# Patient Record
Sex: Male | Born: 1950
Health system: Southern US, Community
[De-identification: ages and names within clinical notes are randomized; demographics above are authoritative.]

## PROBLEM LIST (undated history)

## (undated) DIAGNOSIS — Z8719 Personal history of other diseases of the digestive system: Secondary | ICD-10-CM

## (undated) DIAGNOSIS — K219 Gastro-esophageal reflux disease without esophagitis: Secondary | ICD-10-CM

## (undated) DIAGNOSIS — I10 Essential (primary) hypertension: Secondary | ICD-10-CM

## (undated) DIAGNOSIS — C4372 Malignant melanoma of left lower limb, including hip: Secondary | ICD-10-CM

## (undated) DIAGNOSIS — C801 Malignant (primary) neoplasm, unspecified: Secondary | ICD-10-CM

## (undated) HISTORY — PX: CARDIAC CATHETERIZATION: SHX172

## (undated) HISTORY — PX: OTHER SURGICAL HISTORY: SHX169

## (undated) HISTORY — DX: Malignant melanoma of left lower limb, including hip: C43.72

---

## 1999-04-28 ENCOUNTER — Encounter: Payer: Self-pay | Admitting: Emergency Medicine

## 1999-04-29 ENCOUNTER — Observation Stay (HOSPITAL_COMMUNITY): Admission: EM | Admit: 1999-04-29 | Discharge: 1999-04-29 | Payer: Self-pay | Admitting: Emergency Medicine

## 1999-07-26 ENCOUNTER — Encounter: Payer: Self-pay | Admitting: *Deleted

## 1999-07-26 ENCOUNTER — Ambulatory Visit (HOSPITAL_COMMUNITY): Admission: RE | Admit: 1999-07-26 | Discharge: 1999-07-26 | Payer: Self-pay | Admitting: *Deleted

## 2014-08-11 DIAGNOSIS — I10 Essential (primary) hypertension: Secondary | ICD-10-CM | POA: Diagnosis not present

## 2014-08-11 DIAGNOSIS — D485 Neoplasm of uncertain behavior of skin: Secondary | ICD-10-CM | POA: Diagnosis not present

## 2014-08-11 DIAGNOSIS — C4372 Malignant melanoma of left lower limb, including hip: Secondary | ICD-10-CM | POA: Diagnosis not present

## 2014-09-10 ENCOUNTER — Ambulatory Visit: Payer: Self-pay | Admitting: General Surgery

## 2014-09-10 DIAGNOSIS — C4372 Malignant melanoma of left lower limb, including hip: Secondary | ICD-10-CM | POA: Diagnosis not present

## 2014-09-10 DIAGNOSIS — L989 Disorder of the skin and subcutaneous tissue, unspecified: Secondary | ICD-10-CM | POA: Diagnosis not present

## 2014-09-22 DIAGNOSIS — I10 Essential (primary) hypertension: Secondary | ICD-10-CM | POA: Diagnosis not present

## 2014-09-22 DIAGNOSIS — C4372 Malignant melanoma of left lower limb, including hip: Secondary | ICD-10-CM | POA: Diagnosis not present

## 2014-09-22 NOTE — Pre-Procedure Instructions (Signed)
Walter Hernandez  09/22/2014   Your procedure is scheduled on:  Mon, April 25 @ 9:30 AM  Report to [facility] and go to Admitting at 7:30 AM.  Call this number if you have problems the morning of surgery: [phone]   Remember:   Do not eat food or drink liquids after midnight.   Take these medicines the morning of surgery with A SIP OF WATER: Omeprazole(Prilosec)              No Goody's,BC's,Aleve,Aspirin,Ibuprofen,Fish Oil,or any Herbal Medications.    Do not wear jewelry.  Do not wear lotions, powders, or colognes. You may wear deodorant.  Men may shave face and neck.  Do not bring valuables to the hospital.  The [facility] is not responsible                  for any belongings or valuables.               Contacts, dentures or bridgework may not be worn into surgery.  Leave suitcase in the car. After surgery it may be brought to your room.  For patients admitted to the hospital, discharge time is determined by your                treatment team.               Patients discharged the day of surgery will not be allowed to drive  home.    Special Instructions:   - Preparing for Surgery  Before surgery, you can play an important role.  Because skin is not sterile, your skin needs to be as free of germs as possible.  You can reduce the number of germs on you skin by washing with CHG (chlorahexidine gluconate) soap before surgery.  CHG is an antiseptic cleaner which kills germs and bonds with the skin to continue killing germs even after washing.  Please DO NOT use if you have an allergy to CHG or antibacterial soaps.  If your skin becomes reddened/irritated stop using the CHG and inform your nurse when you arrive at Short Stay.  Do not shave (including legs and underarms) for at least 48 hours prior to the first CHG shower.  You may shave your face.  Please follow these instructions carefully:   1.  Shower with CHG Soap the night before surgery and the                                 morning of Surgery.  2.  If you choose to wash your hair, wash your hair first as usual with your       normal shampoo.  3.  After you shampoo, rinse your hair and body thoroughly to remove the                      Shampoo.  4.  Use CHG as you would any other liquid soap.  You can apply chg directly       to the skin and wash gently with scrungie or a clean washcloth.  5.  Apply the CHG Soap to your body ONLY FROM THE NECK DOWN.        Do not use on open wounds or open sores.  Avoid contact with your eyes,       ears, mouth and genitals (private parts).  Wash genitals (private parts)  with your normal soap.  6.  Wash thoroughly, paying special attention to the area where your surgery        will be performed.  7.  Thoroughly rinse your body with warm water from the neck down.  8.  DO NOT shower/wash with your normal soap after using and rinsing off       the CHG Soap.  9.  Pat yourself dry with a clean towel.            10.  Wear clean pajamas.            11.  Place clean sheets on your bed the night of your first shower and do not        sleep with pets.  Day of Surgery  Do not apply any lotions/deoderants the morning of surgery.  Please wear clean clothes to the hospital/surgery center.     Please read over the following fact sheets that you were given: Pain Booklet, Coughing and Deep Breathing and Surgical Site Infection Prevention

## 2014-09-23 ENCOUNTER — Encounter (HOSPITAL_COMMUNITY)
Admission: RE | Admit: 2014-09-23 | Discharge: 2014-09-23 | Disposition: A | Discharge status: Home / Self Care | Payer: Medicare Other | Source: Ambulatory Visit | Attending: General Surgery | Admitting: General Surgery

## 2014-09-23 ENCOUNTER — Encounter (HOSPITAL_COMMUNITY): Payer: Self-pay

## 2014-09-23 ENCOUNTER — Other Ambulatory Visit: Payer: Self-pay

## 2014-09-23 DIAGNOSIS — Z01818 Encounter for other preprocedural examination: Secondary | ICD-10-CM | POA: Diagnosis not present

## 2014-09-23 DIAGNOSIS — I452 Bifascicular block: Secondary | ICD-10-CM | POA: Insufficient documentation

## 2014-09-23 DIAGNOSIS — Z01812 Encounter for preprocedural laboratory examination: Secondary | ICD-10-CM | POA: Insufficient documentation

## 2014-09-23 DIAGNOSIS — C4372 Malignant melanoma of left lower limb, including hip: Secondary | ICD-10-CM | POA: Diagnosis not present

## 2014-09-23 DIAGNOSIS — K219 Gastro-esophageal reflux disease without esophagitis: Secondary | ICD-10-CM | POA: Insufficient documentation

## 2014-09-23 DIAGNOSIS — I10 Essential (primary) hypertension: Secondary | ICD-10-CM | POA: Diagnosis not present

## 2014-09-23 HISTORY — DX: Essential (primary) hypertension: I10

## 2014-09-23 HISTORY — DX: Personal history of other diseases of the digestive system: Z87.19

## 2014-09-23 HISTORY — DX: Malignant (primary) neoplasm, unspecified: C80.1

## 2014-09-23 HISTORY — DX: Gastro-esophageal reflux disease without esophagitis: K21.9

## 2014-09-23 LAB — CBC
HCT: 46.5 % (ref 39.0–52.0)
Hemoglobin: 16.4 g/dL (ref 13.0–17.0)
MCH: 35.5 pg — ABNORMAL HIGH (ref 26.0–34.0)
MCHC: 35.3 g/dL (ref 30.0–36.0)
MCV: 100.6 fL — AB (ref 78.0–100.0)
Platelets: 212 10*3/uL (ref 150–400)
RBC: 4.62 MIL/uL (ref 4.22–5.81)
RDW: 12 % (ref 11.5–15.5)
WBC: 7.1 10*3/uL (ref 4.0–10.5)

## 2014-09-23 LAB — BASIC METABOLIC PANEL
ANION GAP: 10 (ref 5–15)
BUN: 8 mg/dL (ref 6–23)
CHLORIDE: 105 mmol/L (ref 96–112)
CO2: 27 mmol/L (ref 19–32)
CREATININE: 0.83 mg/dL (ref 0.50–1.35)
Calcium: 9.5 mg/dL (ref 8.4–10.5)
GFR calc Af Amer: >90 mL/min (ref >90)
GFR calc non Af Amer: >90 mL/min (ref >90)
Glucose, Bld: 109 mg/dL — ABNORMAL HIGH (ref 70–99)
POTASSIUM: 4.3 mmol/L (ref 3.5–5.1)
Sodium: 142 mmol/L (ref 135–145)

## 2014-09-23 NOTE — Progress Notes (Addendum)
Had 'chest pains' 16 yrs ago and had cath.  Came out 'Normal', except he has hiatal hernia.  Has not been back since and isn't having any problems. Ekg came back with "bifasicular block"  A. Zelenak, PA called and she will speak with pt.

## 2014-09-23 NOTE — Progress Notes (Signed)
Anesthesia PAT Evaluation:  Patient is a 64 year old male scheduled for wide excision melanoma, left calf with left inguinal SN biopsy on 09/29/14 by Dr. Jacinto Reap. [place] Silos.  History includes left leg melanoma, HTN, smoking, GERD, hiatal hernia, reported "normal" cath in 04/1999 (Dr. Glade Lloyd for evaluation of chest pain with abnormal EKG; chest pain attributed to hiatal hernia and GERD found with GI work-up/barium esophagram 07/26/99). PCP is Dr. Lavone Orn.  09/23/14 EKG: NSR, right BBB, LAFB, bi-fascicular block, septal infarct (age undetermined), possible lateral infarct (age undetermined).  Negative T wave in III and aVF, otherwise I think the EKG is similar to 04/28/99 EKG ([facility]).   He denies chest pain, SOB, edema, palpitations, syncope/near-syncope.  He plays golf and works in [employer].  He has to do a lot of walking, climbing on equipment, walking up stairs for his work and denies any associated CV symptoms.  He thought that Dr. Glade Lloyd may have mentioned a "small" murmur, but does not recall if he ever had an echo done.  Reportedly, Dr. Glade Lloyd did not recommend on-going cardiology follow-up.  On exam, patient is a pleasant Caucasian male in NAD.  Heart RRR. I did not appreciate a murmur.  No carotid bruits noted. Lungs clear. No ankle edema.  Good mouth opening.  Preoperative labs noted.   Patient denied CV symptoms and reports good activity tolerance.  I reviewed 2000 and today's EKG as well with anesthesiologist Dr. Therisa Doyne who agrees that if no acute changes then would anticipate that patient could proceed as planned.  Walter Hernandez [phone] 09/23/2014 2:26 PM

## 2014-09-23 NOTE — Progress Notes (Signed)
   09/23/14 0919  OBSTRUCTIVE SLEEP APNEA  Have you ever been diagnosed with sleep apnea through a sleep study? No  Do you snore loudly (loud enough to be heard through closed doors)?  1  Do you often feel tired, fatigued, or sleepy during the daytime? 0  Has anyone observed you stop breathing during your sleep? 0  Do you have, or are you being treated for high blood pressure? 1 (just recently dx)  BMI more than 35 kg/m2? 0  Age over 66 years old? 1  Neck circumference greater than 40 cm/16 inches? 1  Gender: 1

## 2014-09-28 MED ORDER — CEFAZOLIN SODIUM-DEXTROSE 2-3 GM-% IV SOLR
2.0000 g | INTRAVENOUS | Status: AC
  Administered 2014-09-29: 2 g via INTRAVENOUS
  Filled 2014-09-28: qty 50

## 2014-09-28 MED ORDER — CHLORHEXIDINE GLUCONATE 4 % EX LIQD
1.0000 "application " | Freq: Once | CUTANEOUS | Status: DC
  Filled 2014-09-28: qty 15

## 2014-09-29 ENCOUNTER — Encounter (HOSPITAL_COMMUNITY)
Admission: RE | Disposition: A | Discharge status: Home / Self Care | Payer: Self-pay | Source: Ambulatory Visit | Attending: General Surgery

## 2014-09-29 ENCOUNTER — Encounter (HOSPITAL_COMMUNITY): Payer: Self-pay | Admitting: Surgery

## 2014-09-29 ENCOUNTER — Ambulatory Visit (HOSPITAL_COMMUNITY)
Admission: RE | Admit: 2014-09-29 | Discharge: 2014-09-29 | Disposition: A | Discharge status: Home / Self Care | Payer: Medicare Other | Source: Ambulatory Visit | Attending: General Surgery | Admitting: General Surgery

## 2014-09-29 ENCOUNTER — Ambulatory Visit (HOSPITAL_COMMUNITY): Payer: Medicare Other | Admitting: Anesthesiology

## 2014-09-29 ENCOUNTER — Ambulatory Visit (HOSPITAL_COMMUNITY): Payer: Medicare Other | Admitting: Vascular Surgery

## 2014-09-29 DIAGNOSIS — Z85820 Personal history of malignant melanoma of skin: Secondary | ICD-10-CM | POA: Diagnosis not present

## 2014-09-29 DIAGNOSIS — K219 Gastro-esophageal reflux disease without esophagitis: Secondary | ICD-10-CM | POA: Insufficient documentation

## 2014-09-29 DIAGNOSIS — F17210 Nicotine dependence, cigarettes, uncomplicated: Secondary | ICD-10-CM | POA: Diagnosis not present

## 2014-09-29 DIAGNOSIS — C4372 Malignant melanoma of left lower limb, including hip: Secondary | ICD-10-CM | POA: Diagnosis not present

## 2014-09-29 DIAGNOSIS — I10 Essential (primary) hypertension: Secondary | ICD-10-CM | POA: Insufficient documentation

## 2014-09-29 DIAGNOSIS — L905 Scar conditions and fibrosis of skin: Secondary | ICD-10-CM | POA: Insufficient documentation

## 2014-09-29 DIAGNOSIS — K449 Diaphragmatic hernia without obstruction or gangrene: Secondary | ICD-10-CM | POA: Diagnosis not present

## 2014-09-29 HISTORY — PX: MELANOMA EXCISION WITH SENTINEL LYMPH NODE BIOPSY: SHX5267

## 2014-09-29 SURGERY — MELANOMA EXCISION WITH SENTINEL LYMPH NODE BIOPSY
Anesthesia: General | Site: Leg Lower | Laterality: Left

## 2014-09-29 MED ORDER — TECHNETIUM TC 99M SULFUR COLLOID FILTERED: 0.5000 | Freq: Once | INTRAVENOUS | Status: AC | PRN

## 2014-09-29 MED ORDER — DEXAMETHASONE SODIUM PHOSPHATE 4 MG/ML IJ SOLN
INTRAMUSCULAR | Status: DC | PRN
  Administered 2014-09-29: 4 mg via INTRAVENOUS

## 2014-09-29 MED ORDER — FENTANYL CITRATE (PF) 100 MCG/2ML IJ SOLN
INTRAMUSCULAR | Status: AC
  Administered 2014-09-29: 50 ug via INTRAVENOUS
  Filled 2014-09-29: qty 2

## 2014-09-29 MED ORDER — ONDANSETRON HCL 4 MG/2ML IJ SOLN
INTRAMUSCULAR | Status: DC | PRN
  Administered 2014-09-29: 4 mg via INTRAVENOUS

## 2014-09-29 MED ORDER — FENTANYL CITRATE (PF) 100 MCG/2ML IJ SOLN
50.0000 ug | Freq: Once | INTRAMUSCULAR | Status: AC
  Administered 2014-09-29: 50 ug via INTRAVENOUS

## 2014-09-29 MED ORDER — FENTANYL CITRATE (PF) 100 MCG/2ML IJ SOLN
INTRAMUSCULAR | Status: DC | PRN
  Administered 2014-09-29 (×4): 25 ug via INTRAVENOUS
  Administered 2014-09-29: 50 ug via INTRAVENOUS

## 2014-09-29 MED ORDER — OXYCODONE HCL 5 MG PO TABS: 5.0000 mg | ORAL_TABLET | ORAL | Status: DC | PRN

## 2014-09-29 MED ORDER — ARTIFICIAL TEARS OP OINT
TOPICAL_OINTMENT | OPHTHALMIC | Status: AC
  Filled 2014-09-29: qty 3.5

## 2014-09-29 MED ORDER — ONDANSETRON HCL 4 MG/2ML IJ SOLN
INTRAMUSCULAR | Status: AC
  Filled 2014-09-29: qty 2

## 2014-09-29 MED ORDER — SODIUM CHLORIDE 0.9 % IJ SOLN
INTRAMUSCULAR | Status: DC | PRN
  Administered 2014-09-29: 4 mL via INTRAMUSCULAR

## 2014-09-29 MED ORDER — STERILE WATER FOR INJECTION IJ SOLN
INTRAMUSCULAR | Status: AC
  Filled 2014-09-29: qty 10

## 2014-09-29 MED ORDER — PHENYLEPHRINE 40 MCG/ML (10ML) SYRINGE FOR IV PUSH (FOR BLOOD PRESSURE SUPPORT)
PREFILLED_SYRINGE | INTRAVENOUS | Status: AC
  Filled 2014-09-29: qty 10

## 2014-09-29 MED ORDER — MIDAZOLAM HCL 5 MG/ML IJ SOLN: 1.0000 mg | Freq: Once | INTRAMUSCULAR | Status: DC

## 2014-09-29 MED ORDER — BUPIVACAINE-EPINEPHRINE 0.25% -1:200000 IJ SOLN
INTRAMUSCULAR | Status: DC | PRN
  Administered 2014-09-29: 10 mL

## 2014-09-29 MED ORDER — EPHEDRINE SULFATE 50 MG/ML IJ SOLN
INTRAMUSCULAR | Status: AC
  Filled 2014-09-29: qty 1

## 2014-09-29 MED ORDER — METHYLENE BLUE 1 % INJ SOLN
INTRAMUSCULAR | Status: AC
  Filled 2014-09-29: qty 10

## 2014-09-29 MED ORDER — SODIUM CHLORIDE 0.9 % IJ SOLN
INTRAMUSCULAR | Status: AC
  Filled 2014-09-29: qty 3

## 2014-09-29 MED ORDER — PROMETHAZINE HCL 25 MG/ML IJ SOLN: 6.2500 mg | INTRAMUSCULAR | Status: DC | PRN

## 2014-09-29 MED ORDER — FENTANYL CITRATE (PF) 250 MCG/5ML IJ SOLN
INTRAMUSCULAR | Status: AC
  Filled 2014-09-29: qty 5

## 2014-09-29 MED ORDER — MIDAZOLAM HCL 5 MG/5ML IJ SOLN
INTRAMUSCULAR | Status: DC | PRN
  Administered 2014-09-29: 2 mg via INTRAVENOUS

## 2014-09-29 MED ORDER — DEXAMETHASONE SODIUM PHOSPHATE 4 MG/ML IJ SOLN
INTRAMUSCULAR | Status: AC
  Filled 2014-09-29: qty 2

## 2014-09-29 MED ORDER — ROCURONIUM BROMIDE 50 MG/5ML IV SOLN
INTRAVENOUS | Status: AC
  Filled 2014-09-29: qty 1

## 2014-09-29 MED ORDER — EPHEDRINE SULFATE 50 MG/ML IJ SOLN
INTRAMUSCULAR | Status: DC | PRN
  Administered 2014-09-29 (×2): 5 mg via INTRAVENOUS

## 2014-09-29 MED ORDER — SODIUM CHLORIDE 0.9 % IJ SOLN
INTRAMUSCULAR | Status: AC
  Filled 2014-09-29: qty 10

## 2014-09-29 MED ORDER — PROPOFOL 10 MG/ML IV BOLUS
INTRAVENOUS | Status: AC
  Filled 2014-09-29: qty 20

## 2014-09-29 MED ORDER — PROPOFOL 10 MG/ML IV BOLUS
INTRAVENOUS | Status: DC | PRN
  Administered 2014-09-29: 180 mg via INTRAVENOUS

## 2014-09-29 MED ORDER — HYDROMORPHONE HCL 1 MG/ML IJ SOLN: 0.2500 mg | INTRAMUSCULAR | Status: DC | PRN

## 2014-09-29 MED ORDER — LACTATED RINGERS IV SOLN
INTRAVENOUS | Status: DC
  Administered 2014-09-29: 08:00:00 via INTRAVENOUS

## 2014-09-29 MED ORDER — LIDOCAINE HCL (CARDIAC) 20 MG/ML IV SOLN
INTRAVENOUS | Status: DC | PRN
  Administered 2014-09-29: 100 mg via INTRAVENOUS

## 2014-09-29 MED ORDER — BUPIVACAINE-EPINEPHRINE (PF) 0.25% -1:200000 IJ SOLN
INTRAMUSCULAR | Status: AC
  Filled 2014-09-29: qty 30

## 2014-09-29 MED ORDER — MIDAZOLAM HCL 2 MG/2ML IJ SOLN
INTRAMUSCULAR | Status: AC
  Filled 2014-09-29: qty 2

## 2014-09-29 MED ORDER — MIDAZOLAM HCL 2 MG/2ML IJ SOLN
INTRAMUSCULAR | Status: AC
  Administered 2014-09-29: 1 mg
  Filled 2014-09-29: qty 2

## 2014-09-29 SURGICAL SUPPLY — 55 items
APPLIER CLIP 11 MED OPEN (CLIP)
BANDAGE ELASTIC 4 VELCRO ST LF (GAUZE/BANDAGES/DRESSINGS) ×3 IMPLANT
BENZOIN TINCTURE PRP APPL 2/3 (GAUZE/BANDAGES/DRESSINGS) IMPLANT
CANISTER SUCTION 2500CC (MISCELLANEOUS) ×3 IMPLANT
CHLORAPREP W/TINT 26ML (MISCELLANEOUS) ×3 IMPLANT
CLIP APPLIE 11 MED OPEN (CLIP) IMPLANT
CLIP TI WIDE RED SMALL 6 (CLIP) IMPLANT
CLOSURE WOUND 1/2 X4 (GAUZE/BANDAGES/DRESSINGS)
CONT SPEC 4OZ CLIKSEAL STRL BL (MISCELLANEOUS) ×3 IMPLANT
COVER PROBE W GEL 5X96 (DRAPES) ×3 IMPLANT
COVER SURGICAL LIGHT HANDLE (MISCELLANEOUS) ×3 IMPLANT
DRAPE LAPAROSCOPIC ABDOMINAL (DRAPES) IMPLANT
DRAPE UTILITY XL STRL (DRAPES) ×3 IMPLANT
ELECT CAUTERY BLADE 6.4 (BLADE) ×3 IMPLANT
ELECT REM PT RETURN 9FT ADLT (ELECTROSURGICAL) ×3
ELECTRODE REM PT RTRN 9FT ADLT (ELECTROSURGICAL) ×1 IMPLANT
GAUZE SPONGE 4X4 12PLY STRL (GAUZE/BANDAGES/DRESSINGS) IMPLANT
GEL ULTRASOUND 20GR AQUASONIC (MISCELLANEOUS) IMPLANT
GLOVE BIO SURGEON STRL SZ8 (GLOVE) ×6 IMPLANT
GLOVE BIOGEL PI IND STRL 7.0 (GLOVE) ×1 IMPLANT
GLOVE BIOGEL PI IND STRL 7.5 (GLOVE) ×1 IMPLANT
GLOVE BIOGEL PI IND STRL 8 (GLOVE) ×1 IMPLANT
GLOVE BIOGEL PI INDICATOR 7.0 (GLOVE) ×2
GLOVE BIOGEL PI INDICATOR 7.5 (GLOVE) ×2
GLOVE BIOGEL PI INDICATOR 8 (GLOVE) ×2
GLOVE SURG SS PI 7.0 STRL IVOR (GLOVE) ×3 IMPLANT
GOWN STRL REUS W/ TWL LRG LVL3 (GOWN DISPOSABLE) ×1 IMPLANT
GOWN STRL REUS W/ TWL XL LVL3 (GOWN DISPOSABLE) ×1 IMPLANT
GOWN STRL REUS W/TWL LRG LVL3 (GOWN DISPOSABLE) ×2
GOWN STRL REUS W/TWL XL LVL3 (GOWN DISPOSABLE) ×2
KIT BASIN OR (CUSTOM PROCEDURE TRAY) ×3 IMPLANT
KIT ROOM TURNOVER OR (KITS) ×3 IMPLANT
LIQUID BAND (GAUZE/BANDAGES/DRESSINGS) ×3 IMPLANT
NEEDLE 18GX1X1/2 (RX/OR ONLY) (NEEDLE) ×3 IMPLANT
NEEDLE 22X1 1/2 (OR ONLY) (NEEDLE) ×6 IMPLANT
NS IRRIG 1000ML POUR BTL (IV SOLUTION) ×3 IMPLANT
PACK GENERAL/GYN (CUSTOM PROCEDURE TRAY) ×3 IMPLANT
PAD ARMBOARD 7.5X6 YLW CONV (MISCELLANEOUS) ×3 IMPLANT
PEN SKIN MARKING BROAD (MISCELLANEOUS) ×3 IMPLANT
SPECIMEN JAR MEDIUM (MISCELLANEOUS) ×3 IMPLANT
SPONGE GAUZE 4X4 12PLY STER LF (GAUZE/BANDAGES/DRESSINGS) ×3 IMPLANT
STAPLER VISISTAT 35W (STAPLE) IMPLANT
STRIP CLOSURE SKIN 1/2X4 (GAUZE/BANDAGES/DRESSINGS) IMPLANT
SUT ETHILON 2 0 FS 18 (SUTURE) ×6 IMPLANT
SUT MON AB 4-0 PC3 18 (SUTURE) ×3 IMPLANT
SUT SILK 2 0 FS (SUTURE) ×3 IMPLANT
SUT SILK 2 0 SH (SUTURE) IMPLANT
SUT VIC AB 2-0 SH 27 (SUTURE) ×2
SUT VIC AB 2-0 SH 27XBRD (SUTURE) ×1 IMPLANT
SUT VIC AB 3-0 SH 27 (SUTURE) ×2
SUT VIC AB 3-0 SH 27XBRD (SUTURE) ×1 IMPLANT
SYR CONTROL 10ML LL (SYRINGE) ×6 IMPLANT
TOWEL OR 17X24 6PK STRL BLUE (TOWEL DISPOSABLE) ×3 IMPLANT
TOWEL OR 17X26 10 PK STRL BLUE (TOWEL DISPOSABLE) IMPLANT
WATER STERILE IRR 1000ML POUR (IV SOLUTION) IMPLANT

## 2014-09-29 NOTE — Anesthesia Procedure Notes (Signed)
Procedure Name: LMA Insertion Date/Time: 09/29/2014 9:13 AM Performed by: Maryland Pink Pre-anesthesia Checklist: Patient identified, Emergency Drugs available, Suction available, Patient being monitored and Timeout performed Patient Re-evaluated:Patient Re-evaluated prior to inductionOxygen Delivery Method: Circle system utilized Preoxygenation: Pre-oxygenation with 100% oxygen Intubation Type: IV induction Ventilation: Mask ventilation without difficulty LMA: LMA inserted LMA Size: 5.0 Number of attempts: 1 Placement Confirmation: positive ETCO2 and breath sounds checked- equal and bilateral Tube secured with: Tape Dental Injury: Teeth and Oropharynx as per pre-operative assessment

## 2014-09-29 NOTE — Anesthesia Preprocedure Evaluation (Addendum)
Anesthesia Evaluation  Patient identified by MRN, date of birth, ID band Patient awake    Reviewed: Allergy & Precautions, NPO status , Patient's Chart, lab work & pertinent test results  Airway Mallampati: II  TM Distance: >3 FB Neck ROM: Full    Dental  (+) Upper Dentures, Lower Dentures   Pulmonary Current Smoker,  breath sounds clear to auscultation  Pulmonary exam normal       Cardiovascular hypertension, Pt. on medications Rhythm:Regular Rate:Normal     Neuro/Psych negative neurological ROS  negative psych ROS   GI/Hepatic Neg liver ROS, GERD-  Medicated,  Endo/Other  negative endocrine ROS  Renal/GU negative Renal ROS  negative genitourinary   Musculoskeletal negative musculoskeletal ROS (+)   Abdominal   Peds negative pediatric ROS (+)  Hematology negative hematology ROS (+)   Anesthesia Other Findings Patient claims dentures are extremely hard to remove. Given dental advisory for possible denture damage, voices understanding. Will proceed  Reproductive/Obstetrics negative OB ROS                            Anesthesia Physical Anesthesia Plan  ASA: III  Anesthesia Plan: General   Post-op Pain Management:    Induction: Intravenous  Airway Management Planned: LMA and Oral ETT  Additional Equipment:   Intra-op Plan:   Post-operative Plan: Extubation in OR  Informed Consent: I have reviewed the patients History and Physical, chart, labs and discussed the procedure including the risks, benefits and alternatives for the proposed anesthesia with the patient or authorized representative who has indicated his/her understanding and acceptance.   Dental advisory given  Plan Discussed with: CRNA and Surgeon  Anesthesia Plan Comments:         Anesthesia Quick Evaluation

## 2014-09-29 NOTE — Transfer of Care (Signed)
Immediate Anesthesia Transfer of Care Note  Patient: Walter Hernandez  Procedure(s) Performed: Procedure(s): WIDE EXCISION MELANOMA LEFT CALF WITH LEFT INGUINAL  SENTINEL LYMPH NODE BIOPSY (Left)  Patient Location: PACU  Anesthesia Type:General  Level of Consciousness: awake, alert  and oriented  Airway & Oxygen Therapy: Patient Spontanous Breathing and Patient connected to nasal cannula oxygen  Post-op Assessment: Report given to RN and Post -op Vital signs reviewed and stable  Post vital signs: Reviewed and stable  Last Vitals:  Filed Vitals:   09/29/14 1009  BP:   Pulse: 80  Temp:   Resp: 17    Complications: No apparent anesthesia complications

## 2014-09-29 NOTE — Anesthesia Postprocedure Evaluation (Signed)
  Anesthesia Post-op Note  Patient: Walter Hernandez  Procedure(s) Performed: Procedure(s) (LRB): WIDE EXCISION MELANOMA LEFT CALF WITH LEFT INGUINAL  SENTINEL LYMPH NODE BIOPSY (Left)  Patient Location: PACU  Anesthesia Type: General  Level of Consciousness: awake and alert   Airway and Oxygen Therapy: Patient Spontanous Breathing  Post-op Pain: mild  Post-op Assessment: Post-op Vital signs reviewed, Patient's Cardiovascular Status Stable, Respiratory Function Stable, Patent Airway and No signs of Nausea or vomiting  Last Vitals:  Filed Vitals:   09/29/14 1020  BP:   Pulse:   Temp: 36 C  Resp:     Post-op Vital Signs: stable   Complications: No apparent anesthesia complications

## 2014-09-29 NOTE — Op Note (Signed)
09/29/2014  9:53 AM  PATIENT:  Walter Hernandez  64 y.o. male  PRE-OPERATIVE DIAGNOSIS:  LEFT CALF MELANOMA  POST-OPERATIVE DIAGNOSIS:  LEFT CALF MELANOMA  PROCEDURE:  Procedure(s): LEFT INGUINAL  SENTINEL LYMPH NODE BIOPSY WITH BLUE DYE INJECTION WIDE EXCISION MELANOMA LEFT CALF 11X5CM WITH LAYERED PARTIAL CLOSURE  SURGEON:  Surgeon(s): Georganna Skeans, MD  ASSISTANTS: none   ANESTHESIA:   local and general  EBL:  Total I/O In: -  Out: 10 [Blood:10]  BLOOD ADMINISTERED:none  DRAINS: none   SPECIMEN:  Excision  DISPOSITION OF SPECIMEN:  PATHOLOGY  COUNTS:  YES  DICTATION: .Dragon Dictation  Walter Hernandez presents for left inguinal sentinel lymph node biopsy and wide excision melanoma left calf. He received preoperative nuclear medicine injection to the melanoma site. He was identified in the preop holding area. Informed consent was obtained. He received intravenous antibiotics. He was brought to the operating room. General anesthesia with laryngeal mask airway was administered by the anesthesia staff. We did a time out procedure. I cleaned his calf with chlorhexidine then injected methylene blue cutaneously in 4 regions surrounding the melanoma site on his calf. This area was massaged for a few minutes.His left groin and left distal calf were prepped and draped in sterile fashion. neoprobe was used to locate a spot in the left inguinal region. Incision was made and subcutaneous tissues were dissected down revealing a single lymph node which was hot on the neoprobe. It did not contain a lot of blue dye. The lymph node was circumferentially dissected, using cautery liberally on the small lymphatics. It was removed and sent as a hot node left inguinal. There were no other signals in the region. Wound was irrigated. Local was injected. Deep tissues were approximated with interrupted 3-0 Vicryl. Skin was closed with running 4-0 Monocryl subcuticular followed by Dermabond. Counts were correct there.  Attention was then directed to the melanoma. An elliptical incision was measured out to give greater than 2 cm of circumferential margin around the initial lesion. Local was injected an elliptical incision was made. Subcutaneous tissues were dissected down and the specimen was completely removed including skin and subcutaneous tissues. It was marked for pathology. I changed my gloves. Small flaps were raised medially and laterally. The superior and inferior portion of the wound were partially closed first by using interrupted 2-0 Vicryl's and then interrupted 3-0 nylons in the skin. As planned, the central portion of the wound was left open for wet-to-dry. Hemostasis was ensured. Wound was irrigated. A wet-to-dry sterile dressing was placed followed by Kerlix. All counts were correct. Patient tolerated the procedure well without apparent complication was taken recovery in stable condition.  PATIENT DISPOSITION:  PACU - hemodynamically stable.   Delay start of Pharmacological VTE agent (>24hrs) due to surgical blood loss or risk of bleeding:  no  Georganna Skeans, MD, MPH, FACS Pager: [phone]  4/25/20169:53 AM

## 2014-09-29 NOTE — Interval H&P Note (Signed)
History and Physical Interval Note:  09/29/2014 10:20 AM  Walter Hernandez  has presented today for surgery, with the diagnosis of LEFT CALF MELANOMA  The various methods of treatment have been discussed with the patient and family. After consideration of risks, benefits and other options for treatment, the patient has consented to  Procedure(s): WIDE EXCISION MELANOMA LEFT CALF WITH LEFT INGUINAL  SENTINEL LYMPH NODE BIOPSY (Left) as a surgical intervention .  The patient's history has been reviewed, patient examined, no change in status, stable for surgery.  I have reviewed the patient's chart and labs.  Questions were answered to the patient's satisfaction.     Keyani Rigdon E

## 2014-09-29 NOTE — Progress Notes (Signed)
Patient refused to take upper and lower dentures out. Patient stated "they are in there good...Marland KitchenMarland Kitchenthey won't come out. I didn't have to take them out when I had my colonoscopy." Nurse explained to patient the importance of removing his dentures and explained that this procedure would be different than having a colonoscopy.  Nurse called Dr. Kalman Shan (Anesthesia) and informed him of this. Dr. Kalman Shan in to see patient. Patient still refused to remove dentures after Dr. Kalman Shan spoke with patient. Will continue to monitor. Wife at stretcher side.

## 2014-09-29 NOTE — Addendum Note (Signed)
Addendum  created 09/29/14 1520 by Maryland Pink, CRNA   Modules edited: Anesthesia Events, Narrator   Narrator:  Narrator: Event Log Edited

## 2014-09-29 NOTE — H&P (Signed)
History of Present Illness Walter Neri E. Grandville Silos MD; 09/10/2014 9:47 AM) Patient words: New Patient.  The patient is a 64 year old male who presents with malignant melanoma. [name] underwent biopsy of a lesion on his left distal medial calf. This had developed over several months. His wife is concerned about it. He saw Dr. Ronnald Ramp and underwent a shave biopsy. This came back superficial spreading malignant melanoma 1.6 mm in thickness, deep margins negative, peripheral margins focally positive. I was asked to see him in consultation for further treatment. He has been feeling fine. He also did point out a rough patch of skin on his right calf for me to evaluate.   Allergies Sharman Crate, LPN; 06/08/2438 1:02 AM) No Known Drug Allergies04/11/2014  Medication History Sharman Crate, LPN; 12/05/5364 4:40 AM) Omeprazole ('40MG'$  Capsule DR, Oral daily) Active. Tylenol Extra Strength ('500MG'$  Tablet, Oral as needed) Active.  Review of Systems Clarise Cruz [facility] LPN; 08/08/7423 9:56 AM) General Not Present- Appetite Loss, Chills, Fatigue, Fever, Night Sweats, Weight Gain and Weight Loss. Skin Not Present- Change in Wart/Mole, Dryness, Hives, Jaundice, New Lesions, Non-Healing Wounds, Rash and Ulcer. HEENT Present- Seasonal Allergies and Wears glasses/contact lenses. Not Present- Earache, Hearing Loss, Hoarseness, Nose Bleed, Oral Ulcers, Ringing in the Ears, Sinus Pain, Sore Throat, Visual Disturbances and Yellow Eyes. Respiratory Not Present- Bloody sputum, Chronic Cough, Difficulty Breathing, Snoring and Wheezing. Breast Not Present- Breast Mass, Breast Pain, Nipple Discharge and Skin Changes. Cardiovascular Not Present- Chest Pain, Difficulty Breathing Lying Down, Leg Cramps, Palpitations, Rapid Heart Rate, Shortness of Breath and Swelling of Extremities. Gastrointestinal Not Present- Abdominal Pain, Bloating, Bloody Stool, Change in Bowel Habits, Chronic diarrhea, Constipation, Difficulty Swallowing,  Excessive gas, Gets full quickly at meals, Hemorrhoids, Indigestion, Nausea, Rectal Pain and Vomiting. Male Genitourinary Not Present- Blood in Urine, Change in Urinary Stream, Frequency, Impotence, Nocturia, Painful Urination, Urgency and Urine Leakage.   Vitals Clarise Cruz [facility] LPN; 08/12/7562 3:32 AM) 09/10/2014 9:25 AM Weight: 166 lb Height: 66in Body Surface Area: 1.87 m Body Mass Index: 26.79 kg/m Temp.: 98.23F(Oral)  Pulse: 78 (Regular)  BP: 150/90 (Sitting, Right Arm, Standard)    Physical Exam Walter Neri E. Grandville Silos MD; 09/10/2014 9:48 AM) General Mental Status-Alert. General Appearance-Consistent with stated age. Hydration-Well hydrated. Voice-Normal.  Head and Neck Head-normocephalic, atraumatic with no lesions or palpable masses. Trachea-midline. Thyroid Gland Characteristics - normal size and consistency.  Eye Eyeball - Bilateral-Extraocular movements intact. Sclera/Conjunctiva - Bilateral-No scleral icterus.  Chest and Lung Exam Chest and lung exam reveals -quiet, even and easy respiratory effort with no use of accessory muscles and on auscultation, normal breath sounds, no adventitious sounds and normal vocal resonance. Inspection Chest Wall - Normal. Back - normal.  Breast Breast - Left-Symmetric, Non Tender, No Biopsy scars, no Dimpling, No Inflammation, No Lumpectomy scars, No Mastectomy scars, No Peau d' Orange. Breast - Right-Symmetric, Non Tender, No Biopsy scars, no Dimpling, No Inflammation, No Lumpectomy scars, No Mastectomy scars, No Peau d' Orange. Breast Lump-No Palpable Breast Mass.  Cardiovascular Cardiovascular examination reveals -normal heart sounds, regular rate and rhythm with no murmurs and normal pedal pulses bilaterally.  Abdomen Inspection Inspection of the abdomen reveals - No Hernias. Skin - Scar - no surgical scars. Palpation/Percussion Palpation and Percussion of the abdomen reveal - Soft, Non  Tender, No Rebound tenderness, No Rigidity (guarding) and No hepatosplenomegaly. Auscultation Auscultation of the abdomen reveals - Bowel sounds normal.  Neurologic Neurologic evaluation reveals -alert and oriented x 3 with no impairment of recent or remote memory.  Mental Status-Normal.  Musculoskeletal Normal Exam - Left-Upper Extremity Strength Normal and Lower Extremity Strength Normal. Normal Exam - Right-Upper Extremity Strength Normal and Lower Extremity Strength Normal. Note: Left distal medial calf has a small scab at the biopsy site, no surrounding nodularity. Right mid calf anteriorly has a small rough patch of skin without significant abnormal pigmentation.   Lymphatic Head & Neck  General Head & Neck Lymphatics: Bilateral - Description - Normal. Axillary  General Axillary Region: Bilateral - Description - Normal. Tenderness - Non Tender. Femoral & Inguinal  Generalized Femoral & Inguinal Lymphatics: Bilateral - Description - Normal. Tenderness - Non Tender.    Assessment & Plan Walter Neri E. Grandville Silos MD; 09/10/2014 9:50 AM) MALIGNANT MELANOMA OF LEG, LEFT (172.7  C43.72) Impression: I have offered wide excision of this melanoma site and left inguinal sentinel lymph node biopsy. Procedure, risks, and benefits were discussed in detail with him and his wife. We will need to leave a portion of his wound open to heal by secondary intention. I discussed this and the expected postoperative course and wound care. He is agreeable. We will plan to schedule this later this month. LEG SKIN LESION, RIGHT (709.9  L98.9) Impression: This looks like a small seborrheic keratosis. He will follow up with Dr. Ronnald Ramp about this on his next visit.   Georganna Skeans, MD, MPH, FACS Trauma: [phone] General Surgery: [phone]

## 2014-09-30 ENCOUNTER — Encounter (HOSPITAL_COMMUNITY): Payer: Self-pay | Admitting: General Surgery

## 2014-10-09 ENCOUNTER — Ambulatory Visit (HOSPITAL_COMMUNITY)
Admission: RE | Admit: 2014-10-09 | Discharge: 2014-10-09 | Disposition: A | Discharge status: Home / Self Care | Payer: Medicare Other | Source: Ambulatory Visit | Attending: General Surgery | Admitting: General Surgery

## 2014-10-09 ENCOUNTER — Other Ambulatory Visit: Payer: Self-pay | Admitting: *Deleted

## 2014-10-09 ENCOUNTER — Other Ambulatory Visit: Payer: Self-pay

## 2014-10-09 ENCOUNTER — Other Ambulatory Visit (HOSPITAL_COMMUNITY): Payer: Self-pay | Admitting: General Surgery

## 2014-10-09 DIAGNOSIS — R609 Edema, unspecified: Secondary | ICD-10-CM | POA: Diagnosis not present

## 2014-10-09 DIAGNOSIS — M25472 Effusion, left ankle: Secondary | ICD-10-CM

## 2014-10-09 DIAGNOSIS — M7989 Other specified soft tissue disorders: Secondary | ICD-10-CM

## 2014-10-09 NOTE — Progress Notes (Signed)
*  PRELIMINARY RESULTS* Vascular Ultrasound Left lower extremity venous duplex has been completed.  Preliminary findings: Negative for DVT in visualized veins.   Landry Mellow, RDMS, RVT  10/09/2014, 4:02 PM

## 2014-12-15 DIAGNOSIS — L570 Actinic keratosis: Secondary | ICD-10-CM | POA: Diagnosis not present

## 2014-12-15 DIAGNOSIS — L821 Other seborrheic keratosis: Secondary | ICD-10-CM | POA: Diagnosis not present

## 2014-12-15 DIAGNOSIS — Z85820 Personal history of malignant melanoma of skin: Secondary | ICD-10-CM | POA: Diagnosis not present

## 2014-12-18 ENCOUNTER — Telehealth: Payer: Self-pay | Admitting: Hematology & Oncology

## 2014-12-18 NOTE — Telephone Encounter (Signed)
[facility] ([facility]nurse station) will fax over path from April to Dr. Joylene Igo no.)

## 2014-12-23 ENCOUNTER — Telehealth: Payer: Self-pay

## 2014-12-23 NOTE — Telephone Encounter (Signed)
Confirmed new pt appt. Asked to bring list of medicines, prepare to be here about 2 hours

## 2015-01-06 ENCOUNTER — Telehealth: Payer: Self-pay

## 2015-01-06 NOTE — Telephone Encounter (Signed)
Called back and confirmed appt

## 2015-01-06 NOTE — Telephone Encounter (Signed)
LVM for pt to CB and confirm New pt appt.

## 2015-01-07 ENCOUNTER — Ambulatory Visit (HOSPITAL_BASED_OUTPATIENT_CLINIC_OR_DEPARTMENT_OTHER): Payer: Medicare Other | Admitting: Hematology & Oncology

## 2015-01-07 ENCOUNTER — Other Ambulatory Visit (HOSPITAL_BASED_OUTPATIENT_CLINIC_OR_DEPARTMENT_OTHER): Payer: Medicare Other

## 2015-01-07 ENCOUNTER — Ambulatory Visit: Payer: Medicare Other

## 2015-01-07 ENCOUNTER — Encounter: Payer: Self-pay | Admitting: Hematology & Oncology

## 2015-01-07 VITALS — BP 164/96 | HR 87 | Temp 97.8°F | Resp 16 | Ht 67.0 in | Wt 166.2 lb

## 2015-01-07 DIAGNOSIS — C4372 Malignant melanoma of left lower limb, including hip: Secondary | ICD-10-CM

## 2015-01-07 LAB — COMPREHENSIVE METABOLIC PANEL
ALBUMIN: 4.3 g/dL (ref 3.6–5.1)
ALK PHOS: 70 U/L (ref 40–115)
ALT: 16 U/L (ref 9–46)
AST: 26 U/L (ref 10–35)
BILIRUBIN TOTAL: 1.6 mg/dL — AB (ref 0.2–1.2)
BUN: 7 mg/dL (ref 7–25)
CALCIUM: 9.6 mg/dL (ref 8.6–10.3)
CHLORIDE: 101 mmol/L (ref 98–110)
CO2: 28 mmol/L (ref 20–31)
Creatinine, Ser: 0.84 mg/dL (ref 0.70–1.25)
Glucose, Bld: 137 mg/dL — ABNORMAL HIGH (ref 65–99)
Potassium: 4.6 mmol/L (ref 3.5–5.3)
SODIUM: 140 mmol/L (ref 135–146)
Total Protein: 6.7 g/dL (ref 6.1–8.1)

## 2015-01-07 LAB — CBC WITH DIFFERENTIAL (CANCER CENTER ONLY)
BASO#: 0 10*3/uL (ref 0.0–0.2)
BASO%: 0.4 % (ref 0.0–2.0)
EOS%: 0.9 % (ref 0.0–7.0)
Eosinophils Absolute: 0.1 10*3/uL (ref 0.0–0.5)
HEMATOCRIT: 45.1 % (ref 38.7–49.9)
HGB: 16 g/dL (ref 13.0–17.1)
LYMPH#: 2.3 10*3/uL (ref 0.9–3.3)
LYMPH%: 23 % (ref 14.0–48.0)
MCH: 35.2 pg — ABNORMAL HIGH (ref 28.0–33.4)
MCHC: 35.5 g/dL (ref 32.0–35.9)
MCV: 99 fL — ABNORMAL HIGH (ref 82–98)
MONO#: 0.8 10*3/uL (ref 0.1–0.9)
MONO%: 7.9 % (ref 0.0–13.0)
NEUT%: 67.8 % (ref 40.0–80.0)
NEUTROS ABS: 6.7 10*3/uL — AB (ref 1.5–6.5)
PLATELETS: 195 10*3/uL (ref 145–400)
RBC: 4.55 10*6/uL (ref 4.20–5.70)
RDW: 11.8 % (ref 11.1–15.7)
WBC: 9.9 10*3/uL (ref 4.0–10.0)

## 2015-01-07 LAB — LACTATE DEHYDROGENASE: LDH: 176 U/L (ref 94–250)
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
## 2015-01-26 DIAGNOSIS — D225 Melanocytic nevi of trunk: Secondary | ICD-10-CM | POA: Diagnosis not present

## 2015-01-26 DIAGNOSIS — Z85820 Personal history of malignant melanoma of skin: Secondary | ICD-10-CM | POA: Diagnosis not present

## 2015-01-26 DIAGNOSIS — L821 Other seborrheic keratosis: Secondary | ICD-10-CM | POA: Diagnosis not present

## 2015-03-26 DIAGNOSIS — R399 Unspecified symptoms and signs involving the genitourinary system: Secondary | ICD-10-CM | POA: Diagnosis not present

## 2015-03-26 DIAGNOSIS — N400 Enlarged prostate without lower urinary tract symptoms: Secondary | ICD-10-CM | POA: Diagnosis not present

## 2015-04-21 DIAGNOSIS — L821 Other seborrheic keratosis: Secondary | ICD-10-CM | POA: Diagnosis not present

## 2015-04-21 DIAGNOSIS — D225 Melanocytic nevi of trunk: Secondary | ICD-10-CM | POA: Diagnosis not present

## 2015-04-21 DIAGNOSIS — L812 Freckles: Secondary | ICD-10-CM | POA: Diagnosis not present

## 2015-04-21 DIAGNOSIS — D485 Neoplasm of uncertain behavior of skin: Secondary | ICD-10-CM | POA: Diagnosis not present

## 2015-04-21 DIAGNOSIS — L853 Xerosis cutis: Secondary | ICD-10-CM | POA: Diagnosis not present

## 2015-04-21 DIAGNOSIS — Z85820 Personal history of malignant melanoma of skin: Secondary | ICD-10-CM | POA: Diagnosis not present

## 2015-07-13 ENCOUNTER — Ambulatory Visit: Payer: Medicare Other | Admitting: Hematology & Oncology

## 2015-07-13 ENCOUNTER — Other Ambulatory Visit: Payer: Medicare Other

## 2015-07-28 ENCOUNTER — Encounter: Payer: Self-pay | Admitting: Hematology & Oncology

## 2015-07-28 ENCOUNTER — Other Ambulatory Visit (HOSPITAL_BASED_OUTPATIENT_CLINIC_OR_DEPARTMENT_OTHER): Payer: Medicare Other

## 2015-07-28 ENCOUNTER — Ambulatory Visit (HOSPITAL_BASED_OUTPATIENT_CLINIC_OR_DEPARTMENT_OTHER): Payer: Medicare Other | Admitting: Hematology & Oncology

## 2015-07-28 VITALS — BP 173/91 | HR 83 | Temp 97.9°F | Resp 18 | Ht 67.0 in | Wt 162.0 lb

## 2015-07-28 DIAGNOSIS — C4372 Malignant melanoma of left lower limb, including hip: Secondary | ICD-10-CM | POA: Diagnosis not present

## 2015-07-28 HISTORY — DX: Malignant melanoma of left lower limb, including hip: C43.72

## 2015-07-28 LAB — CMP (CANCER CENTER ONLY)
ALK PHOS: 67 U/L (ref 26–84)
ALT(SGPT): 22 U/L (ref 10–47)
AST: 33 U/L (ref 11–38)
Albumin: 4.4 g/dL (ref 3.3–5.5)
BILIRUBIN TOTAL: 1.5 mg/dL (ref 0.20–1.60)
BUN, Bld: 8 mg/dL (ref 7–22)
CO2: 30 mEq/L (ref 18–33)
Calcium: 9.6 mg/dL (ref 8.0–10.3)
Chloride: 103 mEq/L (ref 98–108)
Creat: 0.8 mg/dl (ref 0.6–1.2)
Glucose, Bld: 119 mg/dL — ABNORMAL HIGH (ref 73–118)
POTASSIUM: 4 meq/L (ref 3.3–4.7)
Sodium: 139 mEq/L (ref 128–145)
TOTAL PROTEIN: 7.4 g/dL (ref 6.4–8.1)

## 2015-07-28 LAB — CBC WITH DIFFERENTIAL (CANCER CENTER ONLY)
BASO#: 0 10*3/uL (ref 0.0–0.2)
BASO%: 0.6 % (ref 0.0–2.0)
EOS%: 1.4 % (ref 0.0–7.0)
Eosinophils Absolute: 0.1 10*3/uL (ref 0.0–0.5)
HEMATOCRIT: 45.5 % (ref 38.7–49.9)
HGB: 15.9 g/dL (ref 13.0–17.1)
LYMPH#: 1.4 10*3/uL (ref 0.9–3.3)
LYMPH%: 21.2 % (ref 14.0–48.0)
MCH: 34.7 pg — AB (ref 28.0–33.4)
MCHC: 34.9 g/dL (ref 32.0–35.9)
MCV: 99 fL — ABNORMAL HIGH (ref 82–98)
MONO#: 0.8 10*3/uL (ref 0.1–0.9)
MONO%: 12.5 % (ref 0.0–13.0)
NEUT#: 4.3 10*3/uL (ref 1.5–6.5)
NEUT%: 64.3 % (ref 40.0–80.0)
Platelets: 186 10*3/uL (ref 145–400)
RBC: 4.58 10*6/uL (ref 4.20–5.70)
RDW: 11.7 % (ref 11.1–15.7)
WBC: 6.7 10*3/uL (ref 4.0–10.0)

## 2015-07-28 LAB — LACTATE DEHYDROGENASE: LDH: 176 U/L (ref 125–245)

## 2015-07-28 NOTE — Progress Notes (Signed)
Hematology and Oncology Follow Up Visit  Walter Hernandez [id number] Nov 16, 1950 65 y.o. 07/28/2015   Principle Diagnosis:  Stage IB (T2aN0M0) superficial spreading melanoma of the left lower leg   Current Therapy:    Observation     Interim History:  Walter Hernandez is back for follow-up. He is still very busy. He discussed in last night. He had a big job that he was doing down in [place]. His company is very busy right now. He is off to [place] after his visit.  He's had no problems with his left leg. The surgical site is healing quite nicely. He's had no leg swelling. He's had no groin problems.  There's been no change in bowel or bladder habits.  He's had no cough. He's had no shortness of breath. He's had no fever. He's had no headache.  He does see his dermatologist on a routine basis.  Overall, his performance status is ECOG 0.    Medications:  Current outpatient prescriptions:  .  acetaminophen (TYLENOL) 500 MG tablet, Take 1,000 mg by mouth every 6 (six) hours as needed for moderate pain., Disp: , Rfl:  .  amLODipine (NORVASC) 5 MG tablet, Take 5 mg by mouth., Disp: , Rfl:  .  Naphazoline-Pheniramine (OPCON-A OP), Place 2 drops into both eyes as needed (for dry eyes)., Disp: , Rfl:  .  omeprazole (PRILOSEC) 40 MG capsule, Take 40 mg by mouth daily., Disp: , Rfl:  .  tamsulosin (FLOMAX) 0.4 MG CAPS capsule, Take 0.4 mg by mouth daily., Disp: , Rfl: 0  Allergies: No Known Allergies  Past Medical History, Surgical history, Social history, and Family History were reviewed and updated.  Review of Systems: As above  Physical Exam:  height is '5\' 7"'$  (1.702 m) and weight is 162 lb (73.483 kg). His oral temperature is 97.9 F (36.6 C). His blood pressure is 173/91 and his pulse is 83. His respiration is 18.   Wt Readings from Last 3 Encounters:  07/28/15 162 lb (73.483 kg)  01/07/15 166 lb 3.2 oz (75.388 kg)  09/29/14 165 lb (74.844 kg)     Head and neck exam shows no  ocular or oral lesions. There are no adenopathy in the neck. Lungs are clear to percussion and auscultation bilaterally. Cardiac exam regular rate and rhythm with no murmurs, rubs or bruits. Abdomen is soft. He has good bowel sounds. There is no fluid wave. There is no palpable liver or spleen tip. Back exam shows no tenderness over the spine, ribs or hips. Extremities shows a dressing on the left lower leg. He has no swelling in the left leg. Right leg is unremarkable. Skin exam shows some seborrheic keratoses. Has a couple actinic keratoses. I do not see any suspicious lesions. Neurological exam shows no focal neurological deficits.  Lab Results  Component Value Date   WBC 6.7 07/28/2015   HGB 15.9 07/28/2015   HCT 45.5 07/28/2015   MCV 99* 07/28/2015   PLT 186 07/28/2015     Chemistry      Component Value Date/Time   NA 140 01/07/2015 1313   K 4.6 01/07/2015 1313   CL 101 01/07/2015 1313   CO2 28 01/07/2015 1313   BUN 7 01/07/2015 1313   CREATININE 0.84 01/07/2015 1313      Component Value Date/Time   CALCIUM 9.6 01/07/2015 1313   ALKPHOS 70 01/07/2015 1313   AST 26 01/07/2015 1313   ALT 16 01/07/2015 1313   BILITOT 1.6* 01/07/2015 1313  Impression and Plan: Walter Hernandez is a 65 year old white male. He had a early stage melanoma resected from the inner aspect of his left lower leg.  He sees his dermatologist on a regular basis which is nice.  I think his risk of recurrence is less than 10%.  We will plan to get him back in 6 more months.     Volanda Napoleon, MD 2/21/201710:17 AM

## 2015-09-09 DIAGNOSIS — J209 Acute bronchitis, unspecified: Secondary | ICD-10-CM | POA: Diagnosis not present

## 2015-09-28 DIAGNOSIS — Z136 Encounter for screening for cardiovascular disorders: Secondary | ICD-10-CM | POA: Diagnosis not present

## 2015-09-28 DIAGNOSIS — Z23 Encounter for immunization: Secondary | ICD-10-CM | POA: Diagnosis not present

## 2015-09-28 DIAGNOSIS — Z1389 Encounter for screening for other disorder: Secondary | ICD-10-CM | POA: Diagnosis not present

## 2015-09-28 DIAGNOSIS — N401 Enlarged prostate with lower urinary tract symptoms: Secondary | ICD-10-CM | POA: Diagnosis not present

## 2015-09-28 DIAGNOSIS — I10 Essential (primary) hypertension: Secondary | ICD-10-CM | POA: Diagnosis not present

## 2015-09-28 DIAGNOSIS — K219 Gastro-esophageal reflux disease without esophagitis: Secondary | ICD-10-CM | POA: Diagnosis not present

## 2015-09-28 DIAGNOSIS — Z0000 Encounter for general adult medical examination without abnormal findings: Secondary | ICD-10-CM | POA: Diagnosis not present

## 2015-09-28 DIAGNOSIS — Z7189 Other specified counseling: Secondary | ICD-10-CM | POA: Diagnosis not present

## 2015-09-30 ENCOUNTER — Other Ambulatory Visit: Payer: Self-pay | Admitting: Internal Medicine

## 2015-09-30 DIAGNOSIS — Z136 Encounter for screening for cardiovascular disorders: Secondary | ICD-10-CM

## 2015-10-06 ENCOUNTER — Ambulatory Visit
Admission: RE | Admit: 2015-10-06 | Discharge: 2015-10-06 | Disposition: A | Discharge status: Home / Self Care | Payer: Medicare Other | Source: Ambulatory Visit | Attending: Internal Medicine | Admitting: Internal Medicine

## 2015-10-06 DIAGNOSIS — Z136 Encounter for screening for cardiovascular disorders: Secondary | ICD-10-CM | POA: Diagnosis not present

## 2015-10-06 DIAGNOSIS — Z87891 Personal history of nicotine dependence: Secondary | ICD-10-CM | POA: Diagnosis not present

## 2015-10-06 IMAGING — US US AORTA SCREENING (MEDICARE)
1 series · 11 of 11 positions shown · non-contrast
Comparison: No prior .

CLINICAL DATA: Medicare screening exam for abdominal aortic
aneurysm.

EXAM:
ABDOMINAL AORTA SCREENING ULTRASOUND
TECHNIQUE: Ultrasound examination of the abdominal aorta was performed as a
screening evaluation for abdominal aortic aneurysm.

[Series 1: us aorta screening (medicare) · 0.32mm/px · 11 of 11 slices shown]
[im 1/11]
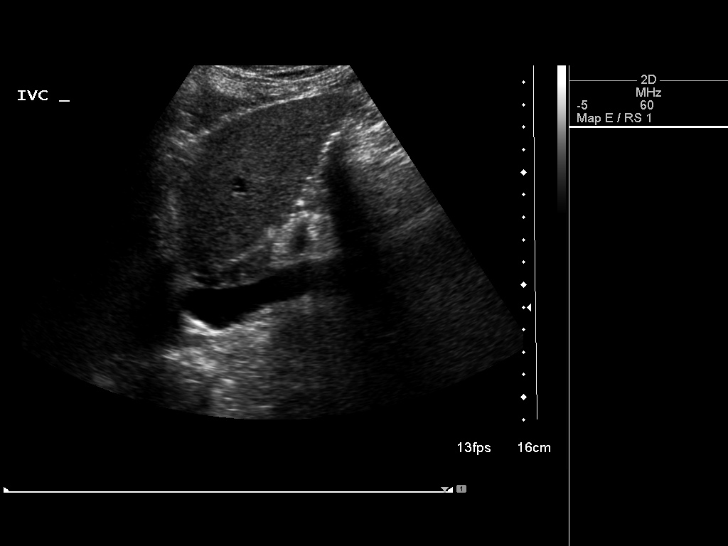
[im 2/11]
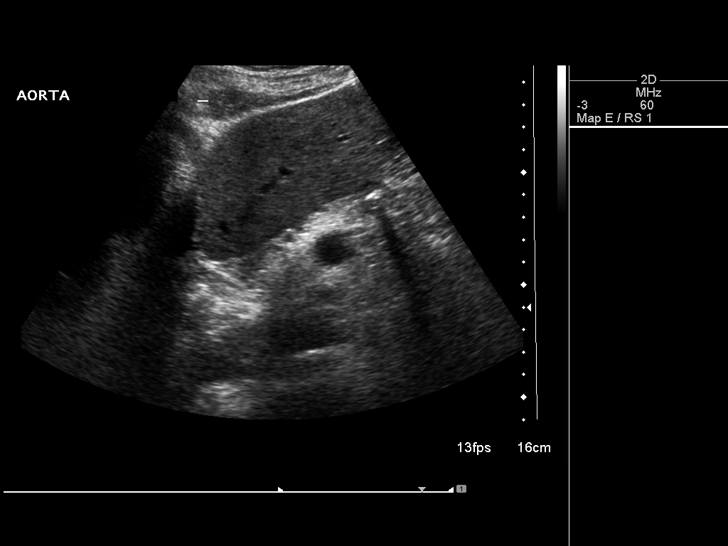
[im 3/11]
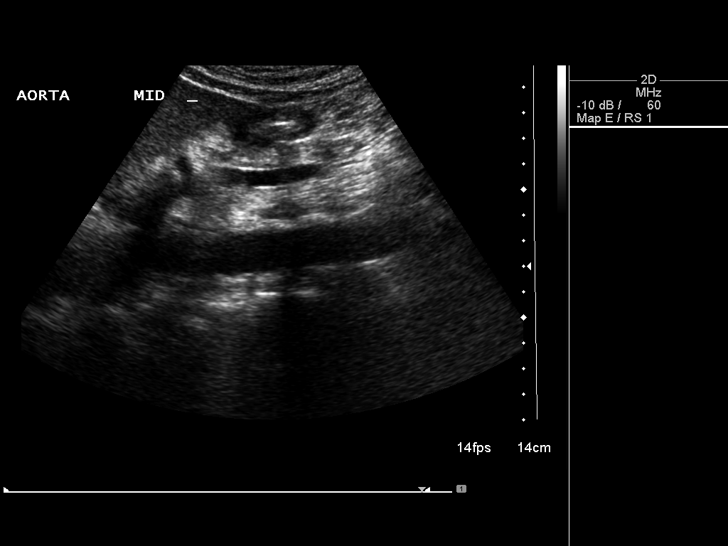
[im 4/11]
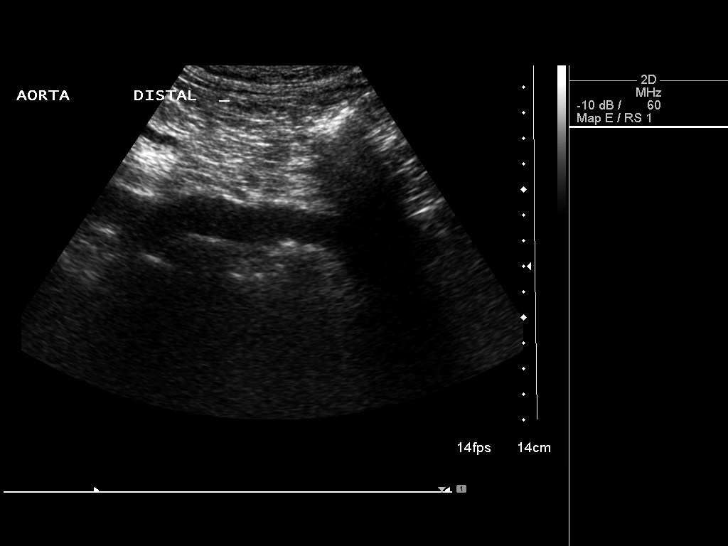
[im 5/11]
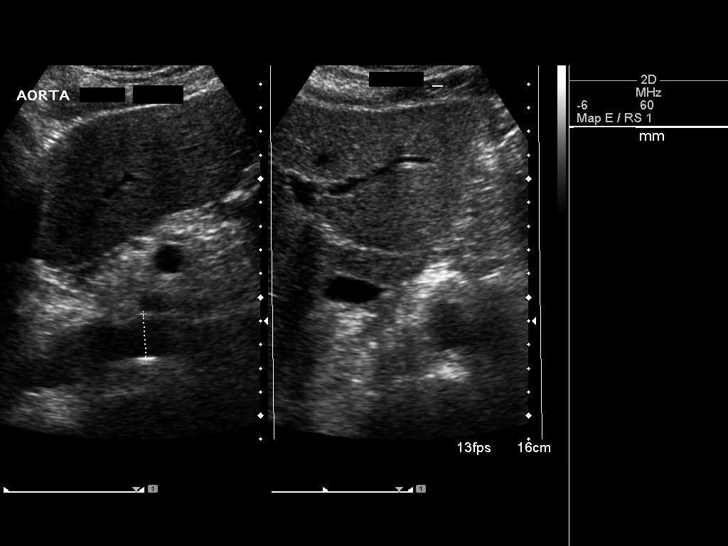
[im 6/11]
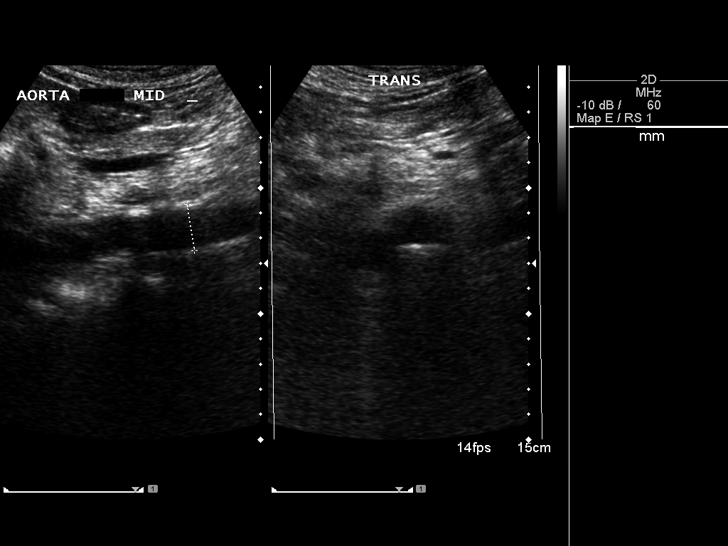
[im 7/11]
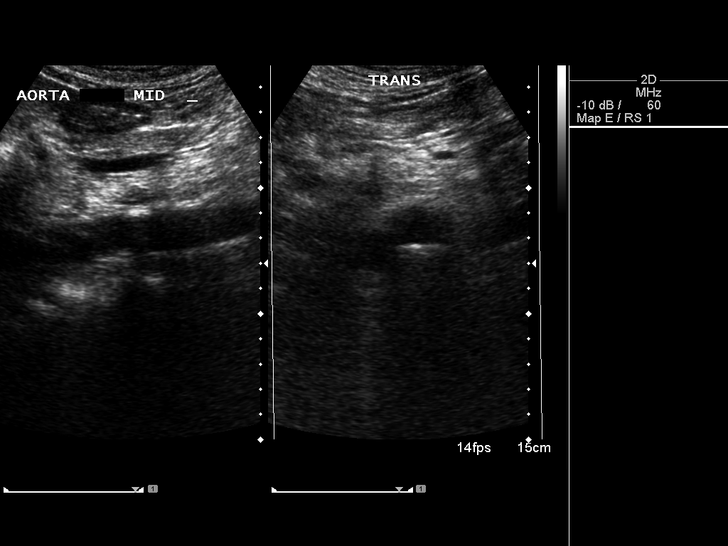
[im 8/11]
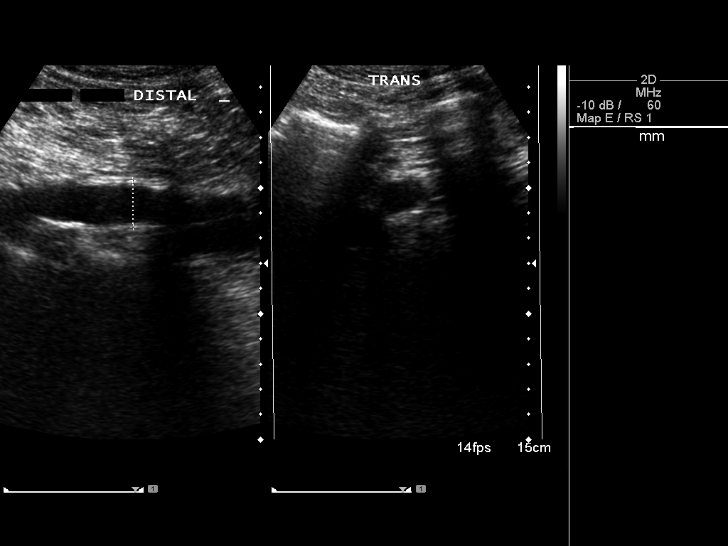
[im 9/11]
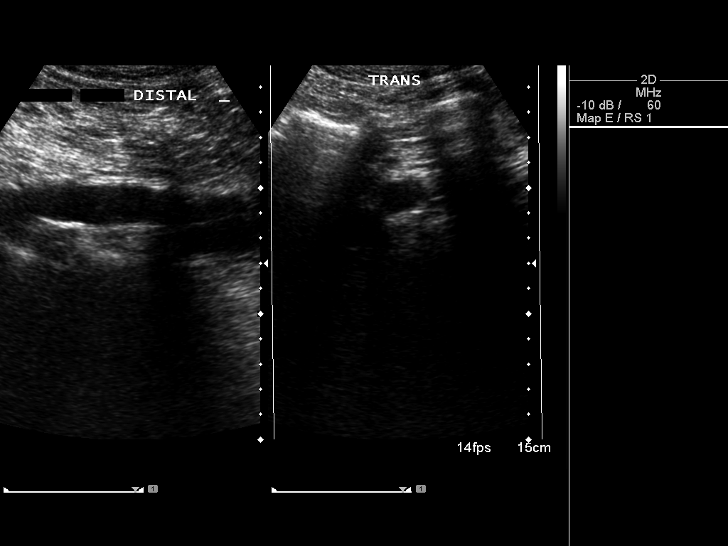
[im 10/11]
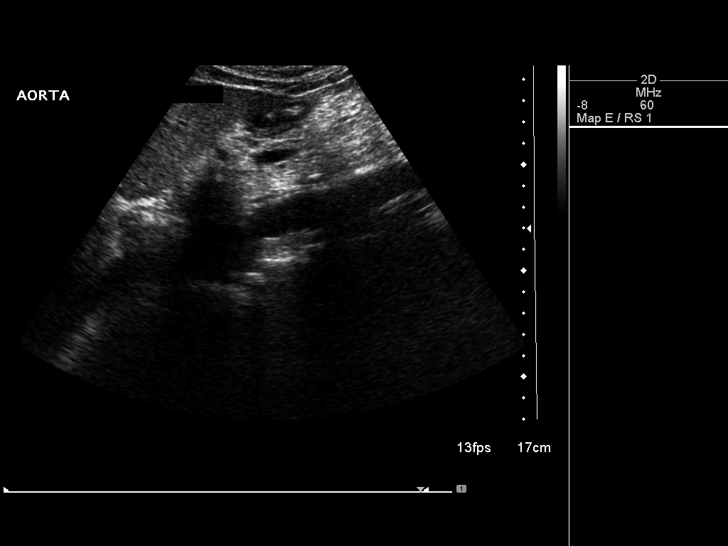
[im 11/11]
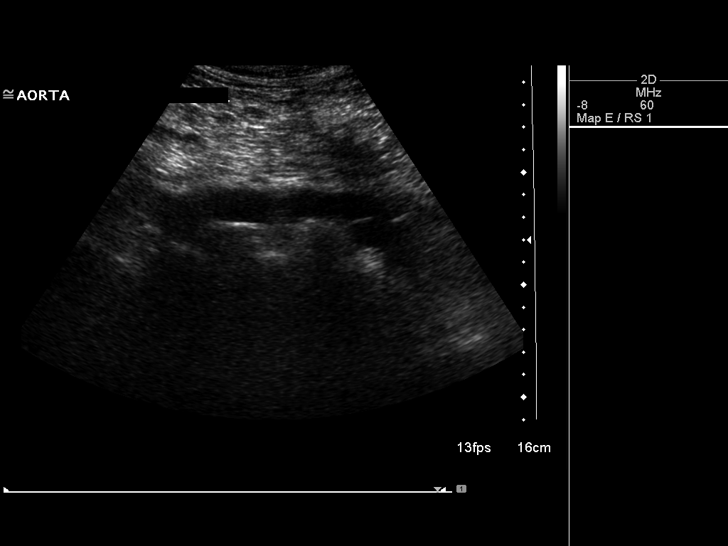

[11 of 11 positions shown; findings below may reference images not displayed]

FINDINGS: Abdominal Aorta

No evidence of abdominal aortic aneurysm. Atherosclerotic vascular
calcification .

Maximum Diameter: 1.9 cm
IMPRESSION: No evidence of abdominal aortic aneurysm .

## 2015-10-26 DIAGNOSIS — L821 Other seborrheic keratosis: Secondary | ICD-10-CM | POA: Diagnosis not present

## 2015-10-26 DIAGNOSIS — L570 Actinic keratosis: Secondary | ICD-10-CM | POA: Diagnosis not present

## 2015-10-26 DIAGNOSIS — D485 Neoplasm of uncertain behavior of skin: Secondary | ICD-10-CM | POA: Diagnosis not present

## 2015-10-26 DIAGNOSIS — Z85820 Personal history of malignant melanoma of skin: Secondary | ICD-10-CM | POA: Diagnosis not present

## 2016-01-25 ENCOUNTER — Other Ambulatory Visit (HOSPITAL_BASED_OUTPATIENT_CLINIC_OR_DEPARTMENT_OTHER): Payer: Medicare Other

## 2016-01-25 ENCOUNTER — Encounter: Payer: Self-pay | Admitting: Family

## 2016-01-25 ENCOUNTER — Ambulatory Visit (HOSPITAL_BASED_OUTPATIENT_CLINIC_OR_DEPARTMENT_OTHER): Payer: Medicare Other | Admitting: Family

## 2016-01-25 VITALS — BP 179/95 | HR 109 | Temp 98.3°F | Resp 18 | Ht 67.0 in | Wt 154.0 lb

## 2016-01-25 DIAGNOSIS — C4372 Malignant melanoma of left lower limb, including hip: Secondary | ICD-10-CM | POA: Diagnosis not present

## 2016-01-25 LAB — CBC WITH DIFFERENTIAL (CANCER CENTER ONLY)
BASO#: 0 10*3/uL (ref 0.0–0.2)
BASO%: 0.5 % (ref 0.0–2.0)
EOS%: 1.2 % (ref 0.0–7.0)
Eosinophils Absolute: 0.1 10*3/uL (ref 0.0–0.5)
HEMATOCRIT: 46.1 % (ref 38.7–49.9)
HEMOGLOBIN: 16.6 g/dL (ref 13.0–17.1)
LYMPH#: 1.7 10*3/uL (ref 0.9–3.3)
LYMPH%: 20.9 % (ref 14.0–48.0)
MCH: 35.3 pg — ABNORMAL HIGH (ref 28.0–33.4)
MCHC: 36 g/dL — ABNORMAL HIGH (ref 32.0–35.9)
MCV: 98 fL (ref 82–98)
MONO#: 0.8 10*3/uL (ref 0.1–0.9)
MONO%: 9.9 % (ref 0.0–13.0)
NEUT%: 67.5 % (ref 40.0–80.0)
NEUTROS ABS: 5.5 10*3/uL (ref 1.5–6.5)
Platelets: 179 10*3/uL (ref 145–400)
RBC: 4.7 10*6/uL (ref 4.20–5.70)
RDW: 12 % (ref 11.1–15.7)
WBC: 8.2 10*3/uL (ref 4.0–10.0)

## 2016-01-25 LAB — CMP (CANCER CENTER ONLY)
ALBUMIN: 4.8 g/dL (ref 3.3–5.5)
ALK PHOS: 72 U/L (ref 26–84)
ALT: 20 U/L (ref 10–47)
AST: 41 U/L — ABNORMAL HIGH (ref 11–38)
BUN: 6 mg/dL — AB (ref 7–22)
CO2: 27 mEq/L (ref 18–33)
CREATININE: 0.9 mg/dL (ref 0.6–1.2)
Calcium: 9.5 mg/dL (ref 8.0–10.3)
Chloride: 105 mEq/L (ref 98–108)
Glucose, Bld: 113 mg/dL (ref 73–118)
Potassium: 3.9 mEq/L (ref 3.3–4.7)
SODIUM: 137 meq/L (ref 128–145)
Total Bilirubin: 1.6 mg/dl (ref 0.20–1.60)
Total Protein: 7.5 g/dL (ref 6.4–8.1)

## 2016-01-25 LAB — LACTATE DEHYDROGENASE: LDH: 200 U/L (ref 125–245)

## 2016-01-25 LAB — TECHNOLOGIST REVIEW CHCC SATELLITE

## 2016-01-25 NOTE — Progress Notes (Signed)
Hematology and Oncology Follow Up Visit  Walter Hernandez [id number] 11-Feb-1951 65 y.o. 01/25/2016   Principle Diagnosis:  Stage IB (T2aN0M0) superficial spreading melanoma of the left lower leg   Current Therapy:   Observation    Interim History:  Walter Hernandez is here today for a follow-up. He is doing quite well and has no complaints at this time. He just returned from vacation in [place] with his family. He had a great time fishing a golfing. He made sure to wear sunscreen and a hat the whole time. He is followed closely by dermatology and continues to see them every 3 months. He has had no new spots come out on his skin. His wife does regular skin checks.  No fever, chills, n/v, cough, rash, dizziness, headache, vision changes, SOB, chest pain, palpitations, abdominal pain or changes in bowel or bladder habits.  No swelling, tenderness, numbness or tingling in his extremities. No new aches or pains.  He has maintained a good appetite and is staying well hydrated.  Medications:    Medication List       Accurate as of 01/25/16  9:41 AM. Always use your most recent med list.          acetaminophen 500 MG tablet Commonly known as:  TYLENOL Take 1,000 mg by mouth every 6 (six) hours as needed for moderate pain.   amLODipine 5 MG tablet Commonly known as:  NORVASC Take 5 mg by mouth.   omeprazole 40 MG capsule Commonly known as:  PRILOSEC Take 40 mg by mouth daily.   OPCON-A OP Place 2 drops into both eyes as needed (for dry eyes).   tamsulosin 0.4 MG Caps capsule Commonly known as:  FLOMAX Take 0.4 mg by mouth daily.       Allergies: No Known Allergies  Past Medical History, Surgical history, Social history, and Family History were reviewed and updated.  Review of Systems: All other 10 point review of systems is negative.   Physical Exam:  height is '5\' 7"'$  (1.702 m) and weight is 154 lb (69.9 kg). His oral temperature is 98.3 F (36.8 C). His blood pressure is 179/95  (abnormal) and his pulse is 109 (abnormal). His respiration is 18.   Wt Readings from Last 3 Encounters:  01/25/16 154 lb (69.9 kg)  07/28/15 162 lb (73.5 kg)  01/07/15 166 lb 3.2 oz (75.4 kg)    Ocular: Sclerae unicteric, pupils equal, round and reactive to light Ear-nose-throat: Oropharynx clear, dentition fair Lymphatic: No cervical supraclavicular or axillary adenopathy Lungs no rales or rhonchi, good excursion bilaterally Heart regular rate and rhythm, no murmur appreciated Abd soft, nontender, positive bowel sounds, no liver or spleen tip palpated on exam  MSK no focal spinal tenderness, no joint edema Neuro: non-focal, well-oriented, appropriate affect Breasts: Deferred  Lab Results  Component Value Date   WBC 8.2 01/25/2016   HGB 16.6 01/25/2016   HCT 46.1 01/25/2016   MCV 98 01/25/2016   PLT 179 01/25/2016   No results found for: FERRITIN, IRON, TIBC, UIBC, IRONPCTSAT Lab Results  Component Value Date   RBC 4.70 01/25/2016   No results found for: KPAFRELGTCHN, LAMBDASER, KAPLAMBRATIO No results found for: IGGSERUM, IGA, IGMSERUM No results found for: Ronnald Ramp, A1GS, A2GS, Violet Baldy, MSPIKE, SPEI   Chemistry      Component Value Date/Time   NA 139 07/28/2015 0927   K 4.0 07/28/2015 0927   CL 103 07/28/2015 0927   CO2 30 07/28/2015 0927  BUN 8 07/28/2015 0927   CREATININE 0.8 07/28/2015 0927      Component Value Date/Time   CALCIUM 9.6 07/28/2015 0927   ALKPHOS 67 07/28/2015 0927   AST 33 07/28/2015 0927   ALT 22 07/28/2015 0927   BILITOT 1.50 07/28/2015 3382     Impression and Plan: Walter Hernandez is a 65 yo white male with history of early stage melanoma resected from the inner aspect of his left lower leg. He has done well and so far there has been no evidence of recurrence. He continue to see dermatology every 3 months and does self skin checks regularly at home.  We will continue to follow along with him and plan to see him back  again in 6 months. He will contact our office with any questions or concerns. We can certainly see him sooner if need be.   Eliezer Bottom, NP 8/21/20179:41 AM

## 2016-04-18 DIAGNOSIS — D225 Melanocytic nevi of trunk: Secondary | ICD-10-CM | POA: Diagnosis not present

## 2016-04-18 DIAGNOSIS — L812 Freckles: Secondary | ICD-10-CM | POA: Diagnosis not present

## 2016-04-18 DIAGNOSIS — H61001 Unspecified perichondritis of right external ear: Secondary | ICD-10-CM | POA: Diagnosis not present

## 2016-04-18 DIAGNOSIS — L821 Other seborrheic keratosis: Secondary | ICD-10-CM | POA: Diagnosis not present

## 2016-04-18 DIAGNOSIS — L570 Actinic keratosis: Secondary | ICD-10-CM | POA: Diagnosis not present

## 2016-04-18 DIAGNOSIS — Z85820 Personal history of malignant melanoma of skin: Secondary | ICD-10-CM | POA: Diagnosis not present

## 2016-08-01 ENCOUNTER — Ambulatory Visit (HOSPITAL_BASED_OUTPATIENT_CLINIC_OR_DEPARTMENT_OTHER): Payer: Medicare Other | Admitting: Family

## 2016-08-01 ENCOUNTER — Other Ambulatory Visit (HOSPITAL_BASED_OUTPATIENT_CLINIC_OR_DEPARTMENT_OTHER): Payer: Medicare Other

## 2016-08-01 VITALS — BP 179/86 | HR 105 | Temp 98.2°F | Resp 18 | Wt 162.0 lb

## 2016-08-01 DIAGNOSIS — Z720 Tobacco use: Secondary | ICD-10-CM

## 2016-08-01 DIAGNOSIS — C4372 Malignant melanoma of left lower limb, including hip: Secondary | ICD-10-CM

## 2016-08-01 LAB — COMPREHENSIVE METABOLIC PANEL
ALBUMIN: 4.6 g/dL (ref 3.5–5.0)
ALK PHOS: 80 U/L (ref 40–150)
ALT: 10 U/L (ref 0–55)
AST: 24 U/L (ref 5–34)
Anion Gap: 10 mEq/L (ref 3–11)
BILIRUBIN TOTAL: 1.27 mg/dL — AB (ref 0.20–1.20)
BUN: 6.2 mg/dL — ABNORMAL LOW (ref 7.0–26.0)
CO2: 27 mEq/L (ref 22–29)
Calcium: 10 mg/dL (ref 8.4–10.4)
Chloride: 104 mEq/L (ref 98–109)
Creatinine: 0.8 mg/dL (ref 0.7–1.3)
GLUCOSE: 107 mg/dL (ref 70–140)
Potassium: 3.8 mEq/L (ref 3.5–5.1)
SODIUM: 142 meq/L (ref 136–145)
Total Protein: 7.6 g/dL (ref 6.4–8.3)

## 2016-08-01 LAB — CBC WITH DIFFERENTIAL (CANCER CENTER ONLY)
BASO#: 0 10*3/uL (ref 0.0–0.2)
BASO%: 0.3 % (ref 0.0–2.0)
EOS ABS: 0.1 10*3/uL (ref 0.0–0.5)
EOS%: 1 % (ref 0.0–7.0)
HCT: 44.5 % (ref 38.7–49.9)
HGB: 16 g/dL (ref 13.0–17.1)
LYMPH#: 1.8 10*3/uL (ref 0.9–3.3)
LYMPH%: 20.1 % (ref 14.0–48.0)
MCH: 35.8 pg — AB (ref 28.0–33.4)
MCHC: 36 g/dL — AB (ref 32.0–35.9)
MCV: 100 fL — ABNORMAL HIGH (ref 82–98)
MONO#: 0.9 10*3/uL (ref 0.1–0.9)
MONO%: 10.1 % (ref 0.0–13.0)
NEUT%: 68.5 % (ref 40.0–80.0)
NEUTROS ABS: 6.1 10*3/uL (ref 1.5–6.5)
Platelets: 184 10*3/uL (ref 145–400)
RBC: 4.47 10*6/uL (ref 4.20–5.70)
RDW: 11.2 % (ref 11.1–15.7)
WBC: 8.9 10*3/uL (ref 4.0–10.0)

## 2016-08-01 LAB — LACTATE DEHYDROGENASE: LDH: 201 U/L (ref 125–245)

## 2016-08-01 NOTE — Progress Notes (Addendum)
Hematology and Oncology Follow Up Visit  Walter Hernandez [id number] Nov 09, 1950 66 y.o. 08/01/2016   Principle Diagnosis:  Stage IB (T2aN0M0) superficial spreading melanoma of the left lower leg   Current Therapy:   Observation    Interim History:  Walter Hernandez is here today for a follow-up. He is doing well but has noticed a small raised spot on the middle of his chest. This is approximately 1 cm x 0.5 cm in size and resembles a possible basal cell carcinoma. Today's exam was otherwise normal.  We will have him follow-up with dermatology today for an appointment for further evaluation. He was last seen by Dr. Coralie Common in November.  He does regular self skin checks and has not noticed any other new areas of concern.  No fever, chills, n/v, cough, rash, dizziness, headache, vision changes, SOB, chest pain, palpitations, abdominal pain or changes in bowel or bladder habits.  He does have a left lower lid stye and plans to follow up with his PCP. He is currently using warm compresses at the site.  He is still smoking 1 ppd and is not ready to quit at this time.  No swelling, tenderness, numbness or tingling in his extremities. No c/o pain.  He has maintained a good appetite and is staying well hydrated. His weight is stable.   Medications:  Allergies as of 08/01/2016   No Known Allergies     Medication List       Accurate as of 08/01/16  9:08 AM. Always use your most recent med list.          acetaminophen 500 MG tablet Commonly known as:  TYLENOL Take 1,000 mg by mouth every 6 (six) hours as needed for moderate pain.   amLODipine 5 MG tablet Commonly known as:  NORVASC Take 5 mg by mouth.   omeprazole 40 MG capsule Commonly known as:  PRILOSEC Take 40 mg by mouth daily.   OPCON-A OP Place 2 drops into both eyes as needed (for dry eyes).   tamsulosin 0.4 MG Caps capsule Commonly known as:  FLOMAX Take 0.4 mg by mouth daily.       Allergies: No Known Allergies  Past  Medical History, Surgical history, Social history, and Family History were reviewed and updated.  Review of Systems: All other 10 point review of systems is negative.   Physical Exam:  weight is 162 lb (73.5 kg). His oral temperature is 98.2 F (36.8 C). His blood pressure is 179/86 (abnormal) and his pulse is 105 (abnormal). His respiration is 18 and oxygen saturation is 99%.   Wt Readings from Last 3 Encounters:  08/01/16 162 lb (73.5 kg)  01/25/16 154 lb (69.9 kg)  07/28/15 162 lb (73.5 kg)    Ocular: Sclerae unicteric, pupils equal, round and reactive to light Ear-nose-throat: Oropharynx clear, dentition fair Lymphatic: No cervical supraclavicular or axillary adenopathy Lungs no rales or rhonchi, good excursion bilaterally Heart regular rate and rhythm, no murmur appreciated Abd soft, nontender, positive bowel sounds, no liver or spleen tip palpated on exam  MSK no focal spinal tenderness, no joint edema Neuro: non-focal, well-oriented, appropriate affect Breasts: Deferred  Lab Results  Component Value Date   WBC 8.9 08/01/2016   HGB 16.0 08/01/2016   HCT 44.5 08/01/2016   MCV 100 (H) 08/01/2016   PLT 184 08/01/2016   No results found for: FERRITIN, IRON, TIBC, UIBC, IRONPCTSAT Lab Results  Component Value Date   RBC 4.47 08/01/2016   No results found  for: KPAFRELGTCHN, LAMBDASER, KAPLAMBRATIO No results found for: IGGSERUM, IGA, IGMSERUM No results found for: Odetta Pink, SPEI   Chemistry      Component Value Date/Time   NA 137 01/25/2016 0926   K 3.9 01/25/2016 0926   CL 105 01/25/2016 0926   CO2 27 01/25/2016 0926   BUN 6 (L) 01/25/2016 0926   CREATININE 0.9 01/25/2016 0926      Component Value Date/Time   CALCIUM 9.5 01/25/2016 0926   ALKPHOS 72 01/25/2016 0926   AST 41 (H) 01/25/2016 0926   ALT 20 01/25/2016 0926   BILITOT 1.60 01/25/2016 0926     Impression and Plan: Mr. Shank is a 66 yo white  male with history of early stage melanoma resected from the inner aspect of his left lower leg. He has done well and so far there has been no evidence of recurrence. He has a 1 cm x 0.5 cm red raised spot on the middle of his chest that he first noticed 2 weeks ago. He will follow-up with dermatology today for further appointment for evaluation and possible removal.   He also plans to follow-up with his PCP regarding the stye in his left eye.   Today's note routed to both PCP and Derm.  We will continue to follow along with him and plan to see him back again in 6 months. He will contact our office with any questions or concerns. We can certainly see him sooner if need be.   Eliezer Bottom, NP 2/26/20189:08 AM

## 2016-08-02 DIAGNOSIS — L821 Other seborrheic keratosis: Secondary | ICD-10-CM | POA: Diagnosis not present

## 2016-08-02 DIAGNOSIS — Z85820 Personal history of malignant melanoma of skin: Secondary | ICD-10-CM | POA: Diagnosis not present

## 2016-10-17 DIAGNOSIS — Z85820 Personal history of malignant melanoma of skin: Secondary | ICD-10-CM | POA: Diagnosis not present

## 2016-10-17 DIAGNOSIS — L821 Other seborrheic keratosis: Secondary | ICD-10-CM | POA: Diagnosis not present

## 2016-10-17 DIAGNOSIS — L814 Other melanin hyperpigmentation: Secondary | ICD-10-CM | POA: Diagnosis not present

## 2016-10-17 DIAGNOSIS — L570 Actinic keratosis: Secondary | ICD-10-CM | POA: Diagnosis not present

## 2017-01-30 ENCOUNTER — Other Ambulatory Visit (HOSPITAL_BASED_OUTPATIENT_CLINIC_OR_DEPARTMENT_OTHER): Payer: Medicare Other

## 2017-01-30 ENCOUNTER — Ambulatory Visit (HOSPITAL_BASED_OUTPATIENT_CLINIC_OR_DEPARTMENT_OTHER): Payer: Medicare Other | Admitting: Hematology & Oncology

## 2017-01-30 VITALS — BP 166/92 | HR 85 | Temp 98.3°F | Resp 16 | Wt 152.0 lb

## 2017-01-30 DIAGNOSIS — C4372 Malignant melanoma of left lower limb, including hip: Secondary | ICD-10-CM

## 2017-01-30 LAB — COMPREHENSIVE METABOLIC PANEL
ALBUMIN: 4.5 g/dL (ref 3.5–5.0)
ALK PHOS: 79 U/L (ref 40–150)
ALT: 18 U/L (ref 0–55)
ANION GAP: 12 meq/L — AB (ref 3–11)
AST: 34 U/L (ref 5–34)
BILIRUBIN TOTAL: 1.49 mg/dL — AB (ref 0.20–1.20)
BUN: 6.9 mg/dL — ABNORMAL LOW (ref 7.0–26.0)
CALCIUM: 10.5 mg/dL — AB (ref 8.4–10.4)
CO2: 29 mEq/L (ref 22–29)
Chloride: 101 mEq/L (ref 98–109)
Creatinine: 0.8 mg/dL (ref 0.7–1.3)
GLUCOSE: 105 mg/dL (ref 70–140)
Potassium: 4.1 mEq/L (ref 3.5–5.1)
SODIUM: 142 meq/L (ref 136–145)
TOTAL PROTEIN: 7.9 g/dL (ref 6.4–8.3)

## 2017-01-30 LAB — CBC WITH DIFFERENTIAL (CANCER CENTER ONLY)
BASO#: 0 10*3/uL (ref 0.0–0.2)
BASO%: 0.4 % (ref 0.0–2.0)
EOS%: 1 % (ref 0.0–7.0)
Eosinophils Absolute: 0.1 10*3/uL (ref 0.0–0.5)
HCT: 44.9 % (ref 38.7–49.9)
HEMOGLOBIN: 16.1 g/dL (ref 13.0–17.1)
LYMPH#: 1.8 10*3/uL (ref 0.9–3.3)
LYMPH%: 25.3 % (ref 14.0–48.0)
MCH: 36.2 pg — AB (ref 28.0–33.4)
MCHC: 35.9 g/dL (ref 32.0–35.9)
MCV: 101 fL — ABNORMAL HIGH (ref 82–98)
MONO#: 1 10*3/uL — ABNORMAL HIGH (ref 0.1–0.9)
MONO%: 13.5 % — ABNORMAL HIGH (ref 0.0–13.0)
NEUT%: 59.8 % (ref 40.0–80.0)
NEUTROS ABS: 4.3 10*3/uL (ref 1.5–6.5)
Platelets: 186 10*3/uL (ref 145–400)
RBC: 4.45 10*6/uL (ref 4.20–5.70)
RDW: 11.8 % (ref 11.1–15.7)
WBC: 7.2 10*3/uL (ref 4.0–10.0)

## 2017-01-30 LAB — LACTATE DEHYDROGENASE: LDH: 199 U/L (ref 125–245)

## 2017-01-30 NOTE — Progress Notes (Signed)
Hematology and Oncology Follow Up Visit  Walter Hernandez [id number] 19-Jul-1950 66 y.o. 01/30/2017   Principle Diagnosis:  Stage IB (T2aN0M0) superficial spreading melanoma of the left lower leg   Current Therapy:   Observation    Interim History:  Walter Hernandez is here today for a follow-up. He's had a fantastic summer. He has been to the beach a couple times with his family. He wears a lot of sunscreen.  He is still working. He is traveling. He is on his way down to [place], [place] today.  He had his wide local excision surgery for the melanoma back in April 2016. Since then he has had no evidence of recurrent disease.  There is no nausea or vomiting. He's had no change in bowel or bladder habits. He's had no cough. His no shortness of breath. He's had no fever. He's had no headache.  Overall, his performance status is ECOG 0.  Medications:  Allergies as of 01/30/2017   No Known Allergies     Medication List       Accurate as of 01/30/17  8:52 AM. Always use your most recent med list.          acetaminophen 500 MG tablet Commonly known as:  TYLENOL Take 1,000 mg by mouth every 6 (six) hours as needed for moderate pain.   amLODipine 5 MG tablet Commonly known as:  NORVASC Take 5 mg by mouth.   omeprazole 40 MG capsule Commonly known as:  PRILOSEC Take 40 mg by mouth daily.   OPCON-A OP Place 2 drops into both eyes as needed (for dry eyes).   tamsulosin 0.4 MG Caps capsule Commonly known as:  FLOMAX Take 0.4 mg by mouth daily.       Allergies: No Known Allergies  Past Medical History, Surgical history, Social history, and Family History were reviewed and updated.  Review of Systems: As stated in the interim history   Physical Exam:  weight is 152 lb (68.9 kg). His oral temperature is 98.3 F (36.8 C). His blood pressure is 166/92 (abnormal) and his pulse is 85. His respiration is 16 and oxygen saturation is 99%.   Wt Readings from Last 3 Encounters:    01/30/17 152 lb (68.9 kg)  08/01/16 162 lb (73.5 kg)  01/25/16 154 lb (69.9 kg)   Physical Exam  Constitutional: He is oriented to person, place, and time.  HENT:  Head: Normocephalic and atraumatic.  Mouth/Throat: Oropharynx is clear and moist.  Eyes: Pupils are equal, round, and reactive to light. EOM are normal.  Neck: Normal range of motion.  Cardiovascular: Normal rate, regular rhythm and normal heart sounds.   Pulmonary/Chest: Effort normal and breath sounds normal.  Abdominal: Soft. Bowel sounds are normal.  Musculoskeletal: Normal range of motion. He exhibits no edema, tenderness or deformity.  Lymphadenopathy:    He has no cervical adenopathy.  Neurological: He is alert and oriented to person, place, and time.  Skin: Skin is warm and dry. No rash noted. No erythema.  He does have the healed wide local excision in the left internal lower leg. This is well-healed.  Psychiatric: He has a normal mood and affect. His behavior is normal. Judgment and thought content normal.  Vitals reviewed.     Lab Results  Component Value Date   WBC 7.2 01/30/2017   HGB 16.1 01/30/2017   HCT 44.9 01/30/2017   MCV 101 (H) 01/30/2017   PLT 186 01/30/2017   No results found for: FERRITIN, IRON,  TIBC, UIBC, IRONPCTSAT Lab Results  Component Value Date   RBC 4.45 01/30/2017   No results found for: KPAFRELGTCHN, LAMBDASER, KAPLAMBRATIO No results found for: IGGSERUM, IGA, IGMSERUM No results found for: Odetta Pink, SPEI   Chemistry      Component Value Date/Time   NA 142 08/01/2016 0827   K 3.8 08/01/2016 0827   CL 105 01/25/2016 0926   CO2 27 08/01/2016 0827   BUN 6.2 (L) 08/01/2016 0827   CREATININE 0.8 08/01/2016 0827      Component Value Date/Time   CALCIUM 10.0 08/01/2016 0827   ALKPHOS 80 08/01/2016 0827   AST 24 08/01/2016 0827   ALT 10 08/01/2016 0827   BILITOT 1.27 (H) 08/01/2016 0827     Impression and Plan:  Walter Hernandez is a 66 year old male with a history of stage Ib melanoma of the left lower leg.  I don't see any evidence of recurrent disease. She looks great. He is going to the dermatologist every 3 months. He is wearing sunscreen.  We will get him back in 6 months. It is been 2 1/2 years since he had his surgery.     Volanda Napoleon, MD 8/27/20188:52 AM

## 2017-04-24 DIAGNOSIS — D225 Melanocytic nevi of trunk: Secondary | ICD-10-CM | POA: Diagnosis not present

## 2017-04-24 DIAGNOSIS — L821 Other seborrheic keratosis: Secondary | ICD-10-CM | POA: Diagnosis not present

## 2017-04-24 DIAGNOSIS — L570 Actinic keratosis: Secondary | ICD-10-CM | POA: Diagnosis not present

## 2017-04-24 DIAGNOSIS — Z85820 Personal history of malignant melanoma of skin: Secondary | ICD-10-CM | POA: Diagnosis not present

## 2017-04-24 DIAGNOSIS — L812 Freckles: Secondary | ICD-10-CM | POA: Diagnosis not present

## 2017-07-31 ENCOUNTER — Ambulatory Visit: Payer: Medicare Other | Admitting: Hematology & Oncology

## 2017-07-31 ENCOUNTER — Other Ambulatory Visit: Payer: Medicare Other

## 2017-08-28 ENCOUNTER — Telehealth: Payer: Self-pay | Admitting: *Deleted

## 2017-08-28 ENCOUNTER — Inpatient Hospital Stay: Payer: Medicare Other | Attending: Hematology & Oncology

## 2017-08-28 ENCOUNTER — Inpatient Hospital Stay (HOSPITAL_BASED_OUTPATIENT_CLINIC_OR_DEPARTMENT_OTHER): Payer: Medicare Other | Admitting: Hematology & Oncology

## 2017-08-28 VITALS — BP 178/92 | HR 91 | Temp 98.4°F | Resp 17 | Wt 152.5 lb

## 2017-08-28 DIAGNOSIS — C4372 Malignant melanoma of left lower limb, including hip: Secondary | ICD-10-CM | POA: Diagnosis not present

## 2017-08-28 DIAGNOSIS — F17210 Nicotine dependence, cigarettes, uncomplicated: Secondary | ICD-10-CM

## 2017-08-28 DIAGNOSIS — Z79899 Other long term (current) drug therapy: Secondary | ICD-10-CM | POA: Diagnosis not present

## 2017-08-28 LAB — CBC WITH DIFFERENTIAL (CANCER CENTER ONLY)
Basophils Absolute: 0 10*3/uL (ref 0.0–0.1)
Basophils Relative: 1 %
Eosinophils Absolute: 0 10*3/uL (ref 0.0–0.5)
Eosinophils Relative: 1 %
HCT: 46.4 % (ref 38.7–49.9)
HEMOGLOBIN: 16.4 g/dL (ref 13.0–17.1)
Lymphocytes Relative: 28 %
Lymphs Abs: 2.1 10*3/uL (ref 0.9–3.3)
MCH: 35.3 pg — ABNORMAL HIGH (ref 28.0–33.4)
MCHC: 35.3 g/dL (ref 32.0–35.9)
MCV: 99.8 fL — AB (ref 82.0–98.0)
Monocytes Absolute: 0.8 10*3/uL (ref 0.1–0.9)
Monocytes Relative: 11 %
Neutro Abs: 4.4 10*3/uL (ref 1.5–6.5)
Neutrophils Relative %: 59 %
Platelet Count: 184 10*3/uL (ref 145–400)
RBC: 4.65 MIL/uL (ref 4.20–5.70)
RDW: 11.7 % (ref 11.1–15.7)
WBC Count: 7.3 10*3/uL (ref 4.0–10.0)

## 2017-08-28 LAB — CMP (CANCER CENTER ONLY)
ALT: 16 U/L (ref 0–55)
AST: 29 U/L (ref 5–34)
Albumin: 4.3 g/dL (ref 3.5–5.0)
Alkaline Phosphatase: 71 U/L (ref 40–150)
Anion gap: 10 (ref 3–11)
BUN: 6 mg/dL — ABNORMAL LOW (ref 7–26)
CHLORIDE: 105 mmol/L (ref 98–109)
CO2: 27 mmol/L (ref 22–29)
CREATININE: 0.81 mg/dL (ref 0.70–1.30)
Calcium: 9.9 mg/dL (ref 8.4–10.4)
GFR, Estimated: >60 mL/min (ref >60)
Glucose, Bld: 111 mg/dL (ref 70–140)
Potassium: 4.4 mmol/L (ref 3.5–5.1)
SODIUM: 142 mmol/L (ref 136–145)
Total Bilirubin: 1.1 mg/dL (ref 0.2–1.2)
Total Protein: 7.3 g/dL (ref 6.4–8.3)

## 2017-08-28 LAB — LACTATE DEHYDROGENASE: LDH: 193 U/L (ref 125–245)

## 2017-08-28 NOTE — Progress Notes (Signed)
Hematology and Oncology Follow Up Visit  Walter Hernandez [id number] [birth year] 67 y.o. 08/28/2017   Principle Diagnosis:  Stage IB (T2aN0M0) superficial spreading melanoma of the left lower leg   Current Therapy:   Observation    Interim History:  Walter Hernandez is here today for a follow-up. We see him every 6 months.  As always, he is working.  He is about to head back down to [facility].  He has a big job down there.  It is very interesting as to what he does.  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    He has had no problems over the winter.  He has not noted any issues with fatigue or weakness.  He has had no nausea or vomiting.  He has had no cough or shortness of breath.  He is still smoking.  He is trying to cut back.  Thankfully, at the plant in [place], they do not allow smoking.  He has had no problems with his left leg.  He is not noted any swelling in the left leg.  He is had no fever.  He got through the wintertime without having influenza.  Overall, his performance status is ECOG 0.  Medications:  Allergies as of 08/28/2017   No Known Allergies     Medication List        Accurate as of 08/28/17 12:23 PM. Always use your most recent med list.          acetaminophen 500 MG tablet Commonly known as:  TYLENOL Take 1,000 mg by mouth every 6 (six) hours as needed for moderate pain.   amLODipine 5 MG tablet Commonly known as:  NORVASC Take 5 mg by mouth.   omeprazole 40 MG capsule Commonly known as:  PRILOSEC Take 40 mg by mouth daily.   OPCON-A OP Place 2 drops into both eyes as needed (for dry eyes).   tamsulosin 0.4 MG Caps capsule Commonly known as:  FLOMAX Take 0.4 mg by mouth daily.       Allergies: No Known Allergies  Past Medical History, Surgical history, Social history, and Family History were reviewed and updated.  Review of Systems: Review of Systems  Constitutional: Negative.     HENT: Negative.   Eyes: Negative.   Respiratory: Negative.   Cardiovascular: Negative.   Gastrointestinal: Negative.   Genitourinary: Negative.   Musculoskeletal: Negative.   Skin: Negative.   Neurological: Negative.   Endo/Heme/Allergies: Negative.   Psychiatric/Behavioral: Negative.      Physical Exam:  weight is 152 lb 8 oz (69.2 kg). His oral temperature is 98.4 F (36.9 C). His blood pressure is 178/92 (abnormal) and his pulse is 91. His respiration is 17 and oxygen saturation is 100%.   Wt Readings from Last 3 Encounters:  08/28/17 152 lb 8 oz (69.2 kg)  01/30/17 152 lb (68.9 kg)  08/01/16 162 lb (73.5 kg)   Physical Exam  Constitutional: He is oriented to person, place, and time.  HENT:  Head: Normocephalic and atraumatic.  Mouth/Throat: Oropharynx is clear and moist.  Eyes: Pupils are equal, round, and reactive to light. EOM are normal.  Neck: Normal range of motion.  Cardiovascular: Normal rate, regular rhythm and normal heart sounds.  Pulmonary/Chest: Effort normal and breath sounds normal.  Abdominal: Soft. Bowel sounds are normal.  Musculoskeletal: Normal range of motion. He exhibits no edema, tenderness or deformity.  Lymphadenopathy:    He has  no cervical adenopathy.  Neurological: He is alert and oriented to person, place, and time.  Skin: Skin is warm and dry. No rash noted. No erythema.  He does have the healed wide local excision in the left internal lower leg. This is well-healed.  Psychiatric: He has a normal mood and affect. His behavior is normal. Judgment and thought content normal.  Vitals reviewed.     Lab Results  Component Value Date   WBC 7.3 08/28/2017   HGB 16.1 01/30/2017   HCT 46.4 08/28/2017   MCV 99.8 (H) 08/28/2017   PLT 184 08/28/2017   No results found for: FERRITIN, IRON, TIBC, UIBC, IRONPCTSAT Lab Results  Component Value Date   RBC 4.65 08/28/2017   No results found for: KPAFRELGTCHN, LAMBDASER, KAPLAMBRATIO No  results found for: IGGSERUM, IGA, IGMSERUM No results found for: Odetta Pink, SPEI   Chemistry      Component Value Date/Time   NA 142 01/30/2017 0804   K 4.1 01/30/2017 0804   CL 105 01/25/2016 0926   CO2 29 01/30/2017 0804   BUN 6.9 (L) 01/30/2017 0804   CREATININE 0.8 01/30/2017 0804      Component Value Date/Time   CALCIUM 10.5 (H) 01/30/2017 0804   ALKPHOS 79 01/30/2017 0804   AST 34 01/30/2017 0804   ALT 18 01/30/2017 0804   BILITOT 1.49 (H) 01/30/2017 0804     Impression and Plan: Mr. Walter Hernandez is a 67 year old male with a history of stage Ib melanoma of the left lower leg.  I don't see any evidence of recurrent disease. He looks great. He is going to the dermatologist every 3 months. He is wearing sunscreen.  We will get him back in 6 months. It is been 3 years since he had his surgery.   I told him to make sure that he drinks a lot of water.   Volanda Napoleon, MD 3/25/201912:23 PM

## 2017-08-28 NOTE — Telephone Encounter (Addendum)
Patient is aware of results.   ----- Message from Volanda Napoleon, MD sent at 08/28/2017  2:08 PM EDT ----- Call - your labs look ok!!  Laurey Arrow

## 2017-11-20 DIAGNOSIS — D225 Melanocytic nevi of trunk: Secondary | ICD-10-CM | POA: Diagnosis not present

## 2017-11-20 DIAGNOSIS — L570 Actinic keratosis: Secondary | ICD-10-CM | POA: Diagnosis not present

## 2017-11-20 DIAGNOSIS — Z85820 Personal history of malignant melanoma of skin: Secondary | ICD-10-CM | POA: Diagnosis not present

## 2017-11-20 DIAGNOSIS — L812 Freckles: Secondary | ICD-10-CM | POA: Diagnosis not present

## 2017-11-20 DIAGNOSIS — L821 Other seborrheic keratosis: Secondary | ICD-10-CM | POA: Diagnosis not present

## 2018-02-26 ENCOUNTER — Inpatient Hospital Stay: Payer: Medicare Other

## 2018-02-26 ENCOUNTER — Inpatient Hospital Stay: Payer: Medicare Other | Attending: Hematology & Oncology | Admitting: Hematology & Oncology

## 2018-02-26 VITALS — BP 164/89 | HR 87 | Temp 98.2°F | Resp 17 | Wt 150.2 lb

## 2018-02-26 DIAGNOSIS — I10 Essential (primary) hypertension: Secondary | ICD-10-CM | POA: Insufficient documentation

## 2018-02-26 DIAGNOSIS — Z79899 Other long term (current) drug therapy: Secondary | ICD-10-CM

## 2018-02-26 DIAGNOSIS — C4372 Malignant melanoma of left lower limb, including hip: Secondary | ICD-10-CM | POA: Diagnosis not present

## 2018-02-26 LAB — CMP (CANCER CENTER ONLY)
ALT: 21 U/L (ref 10–47)
AST: 36 U/L (ref 11–38)
Albumin: 4.8 g/dL (ref 3.5–5.0)
Alkaline Phosphatase: 65 U/L (ref 26–84)
Anion gap: 9 (ref 5–15)
BUN: 6 mg/dL — AB (ref 7–22)
CALCIUM: 10 mg/dL (ref 8.0–10.3)
CO2: 30 mmol/L (ref 18–33)
CREATININE: 0.9 mg/dL (ref 0.60–1.20)
Chloride: 109 mmol/L — ABNORMAL HIGH (ref 98–108)
GLUCOSE: 124 mg/dL — AB (ref 73–118)
POTASSIUM: 4.8 mmol/L — AB (ref 3.3–4.7)
Sodium: 148 mmol/L — ABNORMAL HIGH (ref 128–145)
Total Bilirubin: 1.4 mg/dL (ref 0.2–1.6)
Total Protein: 7.8 g/dL (ref 6.4–8.1)

## 2018-02-26 LAB — LACTATE DEHYDROGENASE: LDH: 206 U/L — ABNORMAL HIGH (ref 98–192)

## 2018-02-26 LAB — CBC WITH DIFFERENTIAL (CANCER CENTER ONLY)
BASOS ABS: 0 10*3/uL (ref 0.0–0.1)
Basophils Relative: 1 %
EOS ABS: 0.1 10*3/uL (ref 0.0–0.5)
EOS PCT: 2 %
HCT: 45 % (ref 38.7–49.9)
Hemoglobin: 15.8 g/dL (ref 13.0–17.1)
Lymphocytes Relative: 25 %
Lymphs Abs: 1.8 10*3/uL (ref 0.9–3.3)
MCH: 35.6 pg — ABNORMAL HIGH (ref 28.0–33.4)
MCHC: 35.1 g/dL (ref 32.0–35.9)
MCV: 101.4 fL — ABNORMAL HIGH (ref 82.0–98.0)
Monocytes Absolute: 0.9 10*3/uL (ref 0.1–0.9)
Monocytes Relative: 13 %
NEUTROS PCT: 59 %
Neutro Abs: 4.3 10*3/uL (ref 1.5–6.5)
PLATELETS: 197 10*3/uL (ref 145–400)
RBC: 4.44 MIL/uL (ref 4.20–5.70)
RDW: 11.6 % (ref 11.1–15.7)
WBC: 7.2 10*3/uL (ref 4.0–10.0)

## 2018-02-26 NOTE — Progress Notes (Signed)
Hematology and Oncology Follow Up Visit  Walter Hernandez [id number] 02-17-1951 67 y.o. 02/26/2018   Principle Diagnosis:  Stage IB (T2aN0M0) superficial spreading melanoma of the left lower leg   Current Therapy:   Observation    Interim History:  Walter Hernandez is here today for a follow-up. We see him every 6 months.  He just got back from [facility].  He was on a golf trip with 16 guys.  He also went deep sea fishing.  He really enjoyed himself.  He was very diligent with putting on sunscreen.  He is working.  He had to go up to [facility].  He has had no nausea or vomiting.  Has had no cough or shortness of breath.  There is been no bony pain.  He has had no headaches.  He did not take his blood pressure medication today.  His blood pressure was quite high.  Overall, his performance status is ECOG 0.  Medications:  Allergies as of 02/26/2018   No Known Allergies     Medication List        Accurate as of 02/26/18 10:49 AM. Always use your most recent med list.          acetaminophen 500 MG tablet Commonly known as:  TYLENOL Take 1,000 mg by mouth every 6 (six) hours as needed for moderate pain.   amLODipine 5 MG tablet Commonly known as:  NORVASC Take 5 mg by mouth.   omeprazole 40 MG capsule Commonly known as:  PRILOSEC Take 40 mg by mouth daily.   OPCON-A OP Place 2 drops into both eyes as needed (for dry eyes).   tamsulosin 0.4 MG Caps capsule Commonly known as:  FLOMAX Take 0.4 mg by mouth daily.       Allergies: No Known Allergies  Past Medical History, Surgical history, Social history, and Family History were reviewed and updated.  Review of Systems: Review of Systems  Constitutional: Negative.   HENT: Negative.   Eyes: Negative.   Respiratory: Negative.   Cardiovascular: Negative.   Gastrointestinal: Negative.   Genitourinary: Negative.   Musculoskeletal: Negative.   Skin: Negative.   Neurological: Negative.   Endo/Heme/Allergies:  Negative.   Psychiatric/Behavioral: Negative.      Physical Exam:  weight is 150 lb 4 oz (68.2 kg). His oral temperature is 98.2 F (36.8 C). His blood pressure is 164/89 (abnormal) and his pulse is 87. His respiration is 17 and oxygen saturation is 100%.   Wt Readings from Last 3 Encounters:  02/26/18 150 lb 4 oz (68.2 kg)  08/28/17 152 lb 8 oz (69.2 kg)  01/30/17 152 lb (68.9 kg)   Physical Exam  Constitutional: He is oriented to person, place, and time.  HENT:  Head: Normocephalic and atraumatic.  Mouth/Throat: Oropharynx is clear and moist.  Eyes: Pupils are equal, round, and reactive to light. EOM are normal.  Neck: Normal range of motion.  Cardiovascular: Normal rate, regular rhythm and normal heart sounds.  Pulmonary/Chest: Effort normal and breath sounds normal.  Abdominal: Soft. Bowel sounds are normal.  Musculoskeletal: Normal range of motion. He exhibits no edema, tenderness or deformity.  Lymphadenopathy:    He has no cervical adenopathy.  Neurological: He is alert and oriented to person, place, and time.  Skin: Skin is warm and dry. No rash noted. No erythema.  He does have the healed wide local excision in the left internal lower leg. This is well-healed.  Psychiatric: He has a normal mood and affect.  His behavior is normal. Judgment and thought content normal.  Vitals reviewed.     Lab Results  Component Value Date   WBC 7.2 02/26/2018   HGB 15.8 02/26/2018   HCT 45.0 02/26/2018   MCV 101.4 (H) 02/26/2018   PLT 197 02/26/2018   No results found for: FERRITIN, IRON, TIBC, UIBC, IRONPCTSAT Lab Results  Component Value Date   RBC 4.44 02/26/2018   No results found for: KPAFRELGTCHN, LAMBDASER, KAPLAMBRATIO No results found for: IGGSERUM, IGA, IGMSERUM No results found for: Kathrynn Ducking, MSPIKE, SPEI   Chemistry      Component Value Date/Time   NA 148 (H) 02/26/2018 0938   NA 142 01/30/2017 0804   K 4.8  (H) 02/26/2018 0938   K 4.1 01/30/2017 0804   CL 109 (H) 02/26/2018 0938   CL 105 01/25/2016 0926   CO2 30 02/26/2018 0938   CO2 29 01/30/2017 0804   BUN 6 (L) 02/26/2018 0938   BUN 6.9 (L) 01/30/2017 0804   CREATININE 0.90 02/26/2018 0938   CREATININE 0.8 01/30/2017 0804      Component Value Date/Time   CALCIUM 10.0 02/26/2018 0938   CALCIUM 10.5 (H) 01/30/2017 0804   ALKPHOS 65 02/26/2018 0938   ALKPHOS 79 01/30/2017 0804   AST 36 02/26/2018 0938   AST 34 01/30/2017 0804   ALT 21 02/26/2018 0938   ALT 18 01/30/2017 0804   BILITOT 1.4 02/26/2018 0938   BILITOT 1.49 (H) 01/30/2017 0804     Impression and Plan: Walter Hernandez is a 67 year old male with a history of stage Ib melanoma of the left lower leg.  I don't see any evidence of recurrent disease. He looks great. He is going to the dermatologist every 3 months. He is wearing sunscreen.  We will get him back in 6 months.  I told him to make sure that he drinks a lot of water.   Volanda Napoleon, MD 9/23/201910:49 AM

## 2018-06-18 DIAGNOSIS — L812 Freckles: Secondary | ICD-10-CM | POA: Diagnosis not present

## 2018-06-18 DIAGNOSIS — L570 Actinic keratosis: Secondary | ICD-10-CM | POA: Diagnosis not present

## 2018-06-18 DIAGNOSIS — Z85820 Personal history of malignant melanoma of skin: Secondary | ICD-10-CM | POA: Diagnosis not present

## 2018-06-18 DIAGNOSIS — L821 Other seborrheic keratosis: Secondary | ICD-10-CM | POA: Diagnosis not present

## 2018-08-24 ENCOUNTER — Telehealth: Payer: Self-pay | Admitting: Hematology & Oncology

## 2018-08-24 NOTE — Telephone Encounter (Signed)
LMVM for patient regarding r/s appt from 3/23 per the new [facility]t Guidelines 3/16

## 2018-08-27 ENCOUNTER — Other Ambulatory Visit: Payer: Medicare Other

## 2018-08-27 ENCOUNTER — Ambulatory Visit: Payer: Medicare Other | Admitting: Hematology & Oncology

## 2018-09-25 ENCOUNTER — Other Ambulatory Visit: Payer: Medicare Other

## 2018-09-25 ENCOUNTER — Ambulatory Visit: Payer: Medicare Other | Admitting: Hematology & Oncology

## 2018-11-06 ENCOUNTER — Ambulatory Visit: Payer: Medicare Other | Admitting: Hematology & Oncology

## 2018-11-06 ENCOUNTER — Other Ambulatory Visit: Payer: Medicare Other

## 2018-12-05 ENCOUNTER — Other Ambulatory Visit: Payer: Medicare Other

## 2018-12-05 ENCOUNTER — Ambulatory Visit: Payer: Medicare Other | Admitting: Hematology & Oncology

## 2018-12-17 DIAGNOSIS — L570 Actinic keratosis: Secondary | ICD-10-CM | POA: Diagnosis not present

## 2018-12-17 DIAGNOSIS — Z85820 Personal history of malignant melanoma of skin: Secondary | ICD-10-CM | POA: Diagnosis not present

## 2018-12-17 DIAGNOSIS — L812 Freckles: Secondary | ICD-10-CM | POA: Diagnosis not present

## 2018-12-17 DIAGNOSIS — L821 Other seborrheic keratosis: Secondary | ICD-10-CM | POA: Diagnosis not present

## 2019-01-01 ENCOUNTER — Inpatient Hospital Stay: Payer: Medicare Other | Attending: Hematology & Oncology

## 2019-01-01 ENCOUNTER — Inpatient Hospital Stay: Payer: Medicare Other | Admitting: Hematology & Oncology

## 2019-01-23 ENCOUNTER — Other Ambulatory Visit: Payer: Self-pay

## 2019-01-23 ENCOUNTER — Telehealth: Payer: Self-pay | Admitting: Hematology & Oncology

## 2019-01-23 ENCOUNTER — Inpatient Hospital Stay: Payer: Medicare Other | Attending: Hematology & Oncology

## 2019-01-23 ENCOUNTER — Inpatient Hospital Stay (HOSPITAL_BASED_OUTPATIENT_CLINIC_OR_DEPARTMENT_OTHER): Payer: Medicare Other | Admitting: Hematology & Oncology

## 2019-01-23 ENCOUNTER — Encounter: Payer: Self-pay | Admitting: Hematology & Oncology

## 2019-01-23 VITALS — BP 159/95 | HR 90 | Temp 98.2°F | Resp 18 | Wt 143.8 lb

## 2019-01-23 DIAGNOSIS — C4372 Malignant melanoma of left lower limb, including hip: Secondary | ICD-10-CM | POA: Diagnosis not present

## 2019-01-23 DIAGNOSIS — Z79899 Other long term (current) drug therapy: Secondary | ICD-10-CM | POA: Diagnosis not present

## 2019-01-23 LAB — CMP (CANCER CENTER ONLY)
ALT: 8 U/L (ref 0–44)
AST: 23 U/L (ref 15–41)
Albumin: 4 g/dL (ref 3.5–5.0)
Alkaline Phosphatase: 87 U/L (ref 38–126)
Anion gap: 10 (ref 5–15)
BUN: 8 mg/dL (ref 8–23)
CO2: 29 mmol/L (ref 22–32)
Calcium: 9.3 mg/dL (ref 8.9–10.3)
Chloride: 102 mmol/L (ref 98–111)
Creatinine: 0.75 mg/dL (ref 0.61–1.24)
GFR, Est AFR Am: >60 mL/min (ref >60)
GFR, Estimated: >60 mL/min (ref >60)
Glucose, Bld: 147 mg/dL — ABNORMAL HIGH (ref 70–99)
Potassium: 4.4 mmol/L (ref 3.5–5.1)
Sodium: 141 mmol/L (ref 135–145)
Total Bilirubin: 0.9 mg/dL (ref 0.3–1.2)
Total Protein: 6.8 g/dL (ref 6.5–8.1)

## 2019-01-23 LAB — CBC WITH DIFFERENTIAL (CANCER CENTER ONLY)
Abs Immature Granulocytes: 0.06 10*3/uL (ref 0.00–0.07)
Basophils Absolute: 0.1 10*3/uL (ref 0.0–0.1)
Basophils Relative: 1 %
Eosinophils Absolute: 0.1 10*3/uL (ref 0.0–0.5)
Eosinophils Relative: 1 %
HCT: 43.9 % (ref 39.0–52.0)
Hemoglobin: 15 g/dL (ref 13.0–17.0)
Immature Granulocytes: 1 %
Lymphocytes Relative: 23 %
Lymphs Abs: 2 10*3/uL (ref 0.7–4.0)
MCH: 34.5 pg — ABNORMAL HIGH (ref 26.0–34.0)
MCHC: 34.2 g/dL (ref 30.0–36.0)
MCV: 100.9 fL — ABNORMAL HIGH (ref 80.0–100.0)
Monocytes Absolute: 1 10*3/uL (ref 0.1–1.0)
Monocytes Relative: 12 %
Neutro Abs: 5.6 10*3/uL (ref 1.7–7.7)
Neutrophils Relative %: 62 %
Platelet Count: 254 10*3/uL (ref 150–400)
RBC: 4.35 MIL/uL (ref 4.22–5.81)
RDW: 11.6 % (ref 11.5–15.5)
WBC Count: 8.8 10*3/uL (ref 4.0–10.5)
nRBC: 0 % (ref 0.0–0.2)

## 2019-01-23 NOTE — Telephone Encounter (Signed)
lmom to inform patient of Feb 2021 appts per 8/19 LOS

## 2019-01-23 NOTE — Progress Notes (Signed)
Hematology and Oncology Follow Up Visit  Walter Hernandez [id number] [birth year] 68 y.o. 01/23/2019   Principle Diagnosis:  Stage IB (T2aN0M0) superficial spreading melanoma of the left lower leg   Current Therapy:   Observation    Interim History:  Mr. Walter Hernandez is here today for a follow-up. We see him every 6 months.  His problem today is that the sounds like he probably tore a meniscus in his right knee.  This happened on Sunday.  He was getting out of his truck and slipped.  His knee twisted.  He has a brace on the knee.  He can barely walk.  I think on his exam, I suspect that he probably tore a meniscus.  I do not think he tore his ACL.  He sees the orthopedist tomorrow.  Otherwise, he is doing okay.  He has been working.  He has had a decrease in the traveling because of the coronavirus.  He has had no problems with cough.  He has had no nausea or vomiting.  He is no change in bowel or bladder habits.  He has had no fever.  Overall, his performance status is ECOG 1.  .  Medications:  Allergies as of 01/23/2019   No Known Allergies     Medication List       Accurate as of January 23, 2019 12:17 PM. If you have any questions, ask your nurse or doctor.        acetaminophen 500 MG tablet Commonly known as: TYLENOL Take 1,000 mg by mouth every 6 (six) hours as needed for moderate pain.   amLODipine 5 MG tablet Commonly known as: NORVASC Take 5 mg by mouth.   omeprazole 40 MG capsule Commonly known as: PRILOSEC Take 40 mg by mouth daily.   OPCON-A OP Place 2 drops into both eyes as needed (for dry eyes).   tamsulosin 0.4 MG Caps capsule Commonly known as: FLOMAX Take 0.4 mg by mouth daily.       Allergies: No Known Allergies  Past Medical History, Surgical history, Social history, and Family History were reviewed and updated.  Review of Systems: Review of Systems  Constitutional: Negative.   HENT: Negative.   Eyes: Negative.   Respiratory: Negative.    Cardiovascular: Negative.   Gastrointestinal: Negative.   Genitourinary: Negative.   Musculoskeletal: Negative.   Skin: Negative.   Neurological: Negative.   Endo/Heme/Allergies: Negative.   Psychiatric/Behavioral: Negative.      Physical Exam:  weight is 143 lb 12 oz (65.2 kg). His temporal temperature is 98.2 F (36.8 C). His blood pressure is 159/95 (abnormal) and his pulse is 90. His respiration is 18 and oxygen saturation is 98%.   Wt Readings from Last 3 Encounters:  01/23/19 143 lb 12 oz (65.2 kg)  02/26/18 150 lb 4 oz (68.2 kg)  08/28/17 152 lb 8 oz (69.2 kg)   Physical Exam Vitals signs reviewed.  HENT:     Head: Normocephalic and atraumatic.  Eyes:     Pupils: Pupils are equal, round, and reactive to light.  Neck:     Musculoskeletal: Normal range of motion.  Cardiovascular:     Rate and Rhythm: Normal rate and regular rhythm.     Heart sounds: Normal heart sounds.  Pulmonary:     Effort: Pulmonary effort is normal.     Breath sounds: Normal breath sounds.  Abdominal:     General: Bowel sounds are normal.     Palpations: Abdomen is soft.  Musculoskeletal: Normal  range of motion.        General: No tenderness or deformity.     Comments: There is a brace on his right knee.  On exam, it appears that he has significant pain with rotation of the knee.  There is a little bit of swelling.  There is no warmth.  He has good pulses in his distal extremities.  Lymphadenopathy:     Cervical: No cervical adenopathy.  Skin:    General: Skin is warm and dry.     Findings: No erythema or rash.     Comments: He does have the healed wide local excision in the left internal lower leg. This is well-healed.  Neurological:     Mental Status: He is alert and oriented to person, place, and time.  Psychiatric:        Behavior: Behavior normal.        Thought Content: Thought content normal.        Judgment: Judgment normal.       Lab Results  Component Value Date   WBC  8.8 01/23/2019   HGB 15.0 01/23/2019   HCT 43.9 01/23/2019   MCV 100.9 (H) 01/23/2019   PLT 254 01/23/2019   No results found for: FERRITIN, IRON, TIBC, UIBC, IRONPCTSAT Lab Results  Component Value Date   RBC 4.35 01/23/2019   No results found for: KPAFRELGTCHN, LAMBDASER, KAPLAMBRATIO No results found for: IGGSERUM, IGA, IGMSERUM No results found for: Odetta Pink, SPEI   Chemistry      Component Value Date/Time   NA 141 01/23/2019 1035   NA 142 01/30/2017 0804   K 4.4 01/23/2019 1035   K 4.1 01/30/2017 0804   CL 102 01/23/2019 1035   CL 105 01/25/2016 0926   CO2 29 01/23/2019 1035   CO2 29 01/30/2017 0804   BUN 8 01/23/2019 1035   BUN 6.9 (L) 01/30/2017 0804   CREATININE 0.75 01/23/2019 1035   CREATININE 0.8 01/30/2017 0804      Component Value Date/Time   CALCIUM 9.3 01/23/2019 1035   CALCIUM 10.5 (H) 01/30/2017 0804   ALKPHOS 87 01/23/2019 1035   ALKPHOS 79 01/30/2017 0804   AST 23 01/23/2019 1035   AST 34 01/30/2017 0804   ALT 8 01/23/2019 1035   ALT 18 01/30/2017 0804   BILITOT 0.9 01/23/2019 1035   BILITOT 1.49 (H) 01/30/2017 0804     Impression and Plan: Mr. Walter Hernandez is a 68 year old male with a history of stage Ib melanoma of the left lower leg.  Overall, I still think he is in very good shape with respect to the melanoma.  I do not see any evidence of recurrence.  If he does need surgery for the right knee, I do not see a problem with this.  I will plan to see him back in another 6 months.    Volanda Napoleon, MD 8/19/202012:17 PM

## 2019-02-06 ENCOUNTER — Encounter: Payer: Self-pay | Admitting: Hematology & Oncology

## 2019-03-22 DIAGNOSIS — Z23 Encounter for immunization: Secondary | ICD-10-CM | POA: Diagnosis not present

## 2019-06-03 ENCOUNTER — Inpatient Hospital Stay (HOSPITAL_COMMUNITY)
Admission: EM | Admit: 2019-06-03 | Discharge: 2019-06-05 | DRG: 054 | Disposition: A | Discharge status: Home Health | Payer: Medicare Other | Attending: Internal Medicine | Admitting: Internal Medicine

## 2019-06-03 ENCOUNTER — Emergency Department (HOSPITAL_COMMUNITY): Payer: Medicare Other

## 2019-06-03 ENCOUNTER — Inpatient Hospital Stay (HOSPITAL_COMMUNITY): Payer: Medicare Other

## 2019-06-03 DIAGNOSIS — R590 Localized enlarged lymph nodes: Secondary | ICD-10-CM | POA: Diagnosis present

## 2019-06-03 DIAGNOSIS — R29898 Other symptoms and signs involving the musculoskeletal system: Secondary | ICD-10-CM

## 2019-06-03 DIAGNOSIS — Z20828 Contact with and (suspected) exposure to other viral communicable diseases: Secondary | ICD-10-CM | POA: Diagnosis present

## 2019-06-03 DIAGNOSIS — K219 Gastro-esophageal reflux disease without esophagitis: Secondary | ICD-10-CM | POA: Diagnosis present

## 2019-06-03 DIAGNOSIS — F17210 Nicotine dependence, cigarettes, uncomplicated: Secondary | ICD-10-CM | POA: Diagnosis present

## 2019-06-03 DIAGNOSIS — G936 Cerebral edema: Secondary | ICD-10-CM | POA: Diagnosis present

## 2019-06-03 DIAGNOSIS — C4372 Malignant melanoma of left lower limb, including hip: Secondary | ICD-10-CM | POA: Diagnosis present

## 2019-06-03 DIAGNOSIS — R2689 Other abnormalities of gait and mobility: Secondary | ICD-10-CM | POA: Diagnosis present

## 2019-06-03 DIAGNOSIS — R599 Enlarged lymph nodes, unspecified: Secondary | ICD-10-CM | POA: Diagnosis not present

## 2019-06-03 DIAGNOSIS — C787 Secondary malignant neoplasm of liver and intrahepatic bile duct: Secondary | ICD-10-CM | POA: Diagnosis present

## 2019-06-03 DIAGNOSIS — R531 Weakness: Secondary | ICD-10-CM | POA: Diagnosis present

## 2019-06-03 DIAGNOSIS — R000 Tachycardia, unspecified: Secondary | ICD-10-CM | POA: Diagnosis not present

## 2019-06-03 DIAGNOSIS — R200 Anesthesia of skin: Secondary | ICD-10-CM

## 2019-06-03 DIAGNOSIS — C7931 Secondary malignant neoplasm of brain: Principal | ICD-10-CM | POA: Diagnosis present

## 2019-06-03 DIAGNOSIS — N400 Enlarged prostate without lower urinary tract symptoms: Secondary | ICD-10-CM | POA: Diagnosis present

## 2019-06-03 DIAGNOSIS — I10 Essential (primary) hypertension: Secondary | ICD-10-CM | POA: Diagnosis present

## 2019-06-03 DIAGNOSIS — C3490 Malignant neoplasm of unspecified part of unspecified bronchus or lung: Secondary | ICD-10-CM | POA: Diagnosis present

## 2019-06-03 DIAGNOSIS — M6281 Muscle weakness (generalized): Secondary | ICD-10-CM | POA: Diagnosis not present

## 2019-06-03 DIAGNOSIS — Z85820 Personal history of malignant melanoma of skin: Secondary | ICD-10-CM

## 2019-06-03 LAB — DIFFERENTIAL
Abs Immature Granulocytes: 0.1 10*3/uL — ABNORMAL HIGH (ref 0.00–0.07)
Basophils Absolute: 0.1 10*3/uL (ref 0.0–0.1)
Basophils Relative: 1 %
Eosinophils Absolute: 0.1 10*3/uL (ref 0.0–0.5)
Eosinophils Relative: 1 %
Immature Granulocytes: 1 %
Lymphocytes Relative: 17 %
Lymphs Abs: 1.5 10*3/uL (ref 0.7–4.0)
Monocytes Absolute: 0.9 10*3/uL (ref 0.1–1.0)
Monocytes Relative: 10 %
Neutro Abs: 6.6 10*3/uL (ref 1.7–7.7)
Neutrophils Relative %: 70 %

## 2019-06-03 LAB — COMPREHENSIVE METABOLIC PANEL
ALT: 12 U/L (ref 0–44)
AST: 28 U/L (ref 15–41)
Albumin: 3.9 g/dL (ref 3.5–5.0)
Alkaline Phosphatase: 62 U/L (ref 38–126)
Anion gap: 14 (ref 5–15)
BUN: 7 mg/dL — ABNORMAL LOW (ref 8–23)
CO2: 24 mmol/L (ref 22–32)
Calcium: 9.7 mg/dL (ref 8.9–10.3)
Chloride: 98 mmol/L (ref 98–111)
Creatinine, Ser: 1 mg/dL (ref 0.61–1.24)
GFR calc Af Amer: >60 mL/min (ref >60)
GFR calc non Af Amer: >60 mL/min (ref >60)
Glucose, Bld: 101 mg/dL — ABNORMAL HIGH (ref 70–99)
Potassium: 4.1 mmol/L (ref 3.5–5.1)
Sodium: 136 mmol/L (ref 135–145)
Total Bilirubin: 1 mg/dL (ref 0.3–1.2)
Total Protein: 7.1 g/dL (ref 6.5–8.1)

## 2019-06-03 LAB — I-STAT CHEM 8, ED
BUN: 8 mg/dL (ref 8–23)
Calcium, Ion: 1.11 mmol/L — ABNORMAL LOW (ref 1.15–1.40)
Chloride: 99 mmol/L (ref 98–111)
Creatinine, Ser: 0.5 mg/dL — ABNORMAL LOW (ref 0.61–1.24)
Glucose, Bld: 99 mg/dL (ref 70–99)
HCT: 49 % (ref 39.0–52.0)
Hemoglobin: 16.7 g/dL (ref 13.0–17.0)
Potassium: 3.8 mmol/L (ref 3.5–5.1)
Sodium: 135 mmol/L (ref 135–145)
TCO2: 25 mmol/L (ref 22–32)

## 2019-06-03 LAB — PROTIME-INR
INR: 0.9 (ref 0.8–1.2)
Prothrombin Time: 12.4 seconds (ref 11.4–15.2)

## 2019-06-03 LAB — CBG MONITORING, ED: Glucose-Capillary: 104 mg/dL — ABNORMAL HIGH (ref 70–99)

## 2019-06-03 LAB — CBC
HCT: 47.6 % (ref 39.0–52.0)
Hemoglobin: 16.1 g/dL (ref 13.0–17.0)
MCH: 34 pg (ref 26.0–34.0)
MCHC: 33.8 g/dL (ref 30.0–36.0)
MCV: 100.6 fL — ABNORMAL HIGH (ref 80.0–100.0)
Platelets: 255 10*3/uL (ref 150–400)
RBC: 4.73 MIL/uL (ref 4.22–5.81)
RDW: 11.5 % (ref 11.5–15.5)
WBC: 9.2 10*3/uL (ref 4.0–10.5)
nRBC: 0 % (ref 0.0–0.2)

## 2019-06-03 LAB — APTT: aPTT: 33 seconds (ref 24–36)

## 2019-06-03 LAB — SARS CORONAVIRUS 2 (TAT 6-24 HRS): SARS Coronavirus 2: NEGATIVE

## 2019-06-03 IMAGING — MR MR HEAD WO/W CM
9 of 14 series · 25 of 48 positions shown · IV contrast (6 GV)
Comparison: CT head [DATE]

CLINICAL DATA: Left arm weakness.  Abnormal CT head

EXAM:
MRI HEAD WITHOUT AND WITH CONTRAST
TECHNIQUE: Multiplanar, multiecho pulse sequences of the brain and surrounding
structures were obtained without and with intravenous contrast.
CONTRAST:  6mL GADAVIST GADOBUTROL 1 MMOL/ML IV SOLN

[Series 2: DWI · axial · 3.0mm · 0.94mm/px · z∈[-113,+47]mm · 6 of 110 slices shown (1 of 2)]
[im 1/110]
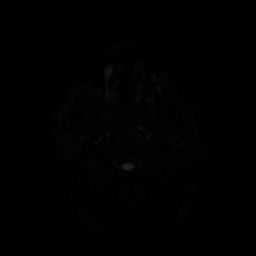
[im 22/110]
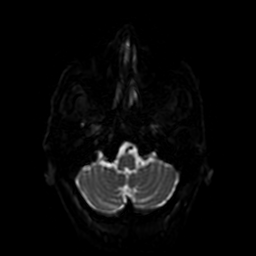
[im 44/110]
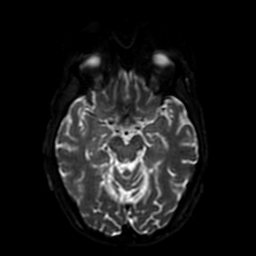
[im 66/110]
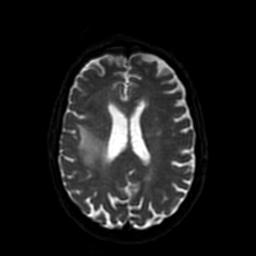
[im 88/110]
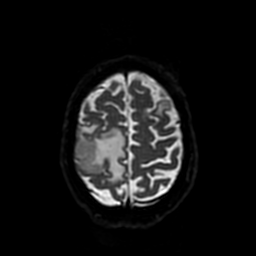
[im 110/110]
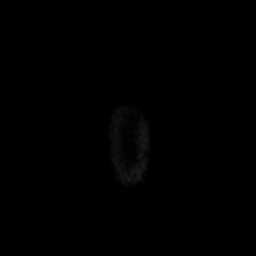

[Series 3: DWI · coronal · 4.0mm · 0.94mm/px · 5 of 74 slices shown (2 of 2)]
[im 1/74]
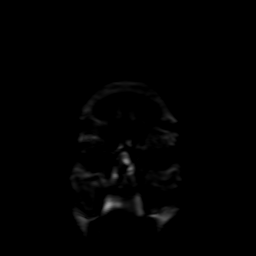
[im 19/74]
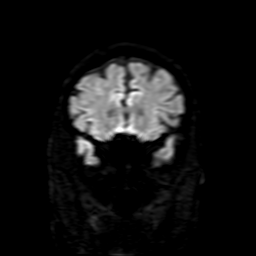
[im 37/74]
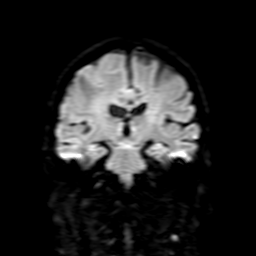
[im 55/74]
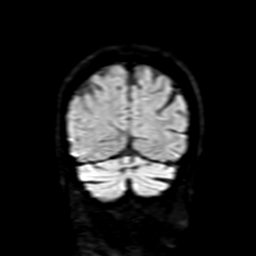
[im 74/74]
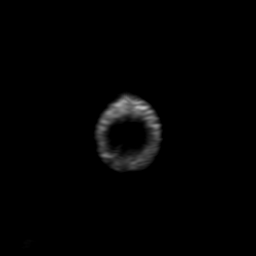

[Series 4: FLAIR · sagittal · 5.0mm · 0.47mm/px · 2 of 25 slices shown (1 of 2)]
[im 1/25]
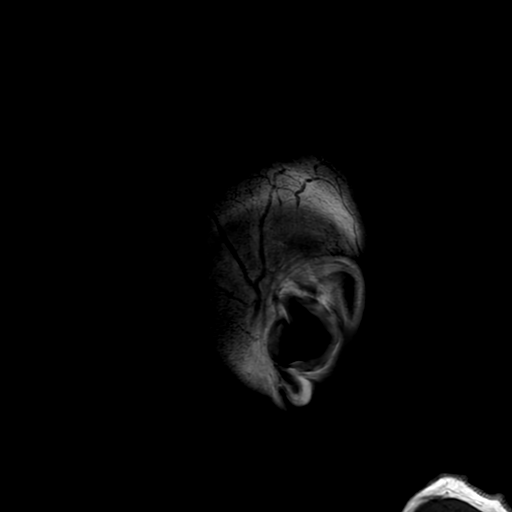
[im 25/25]
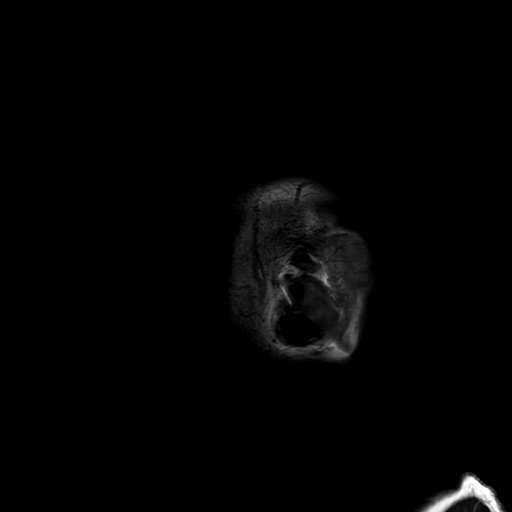

[Series 5: FLAIR · axial · 3.0mm · 0.41mm/px · z∈[-111,+50]mm · 2 of 28 slices shown (2 of 2)]
[im 1/28]
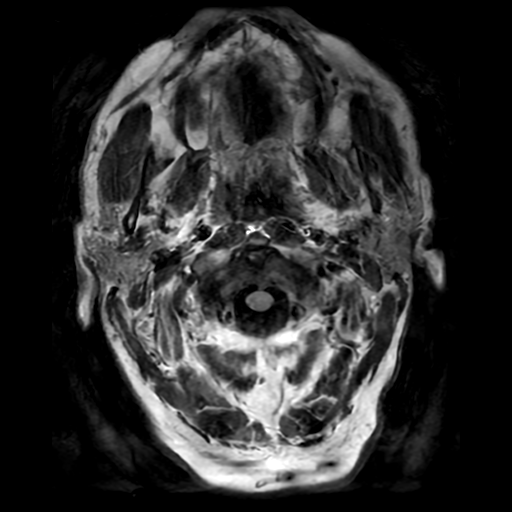
[im 28/28]
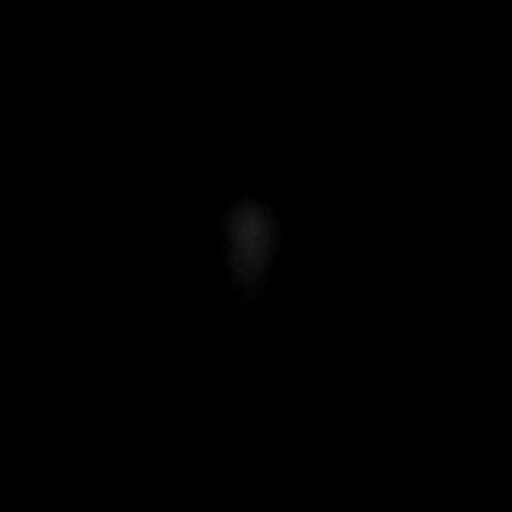

[Series 7: T2 · axial · 5.0mm · 0.23mm/px · z∈[-111,+50]mm · 2 of 28 slices shown]
[im 1/28]
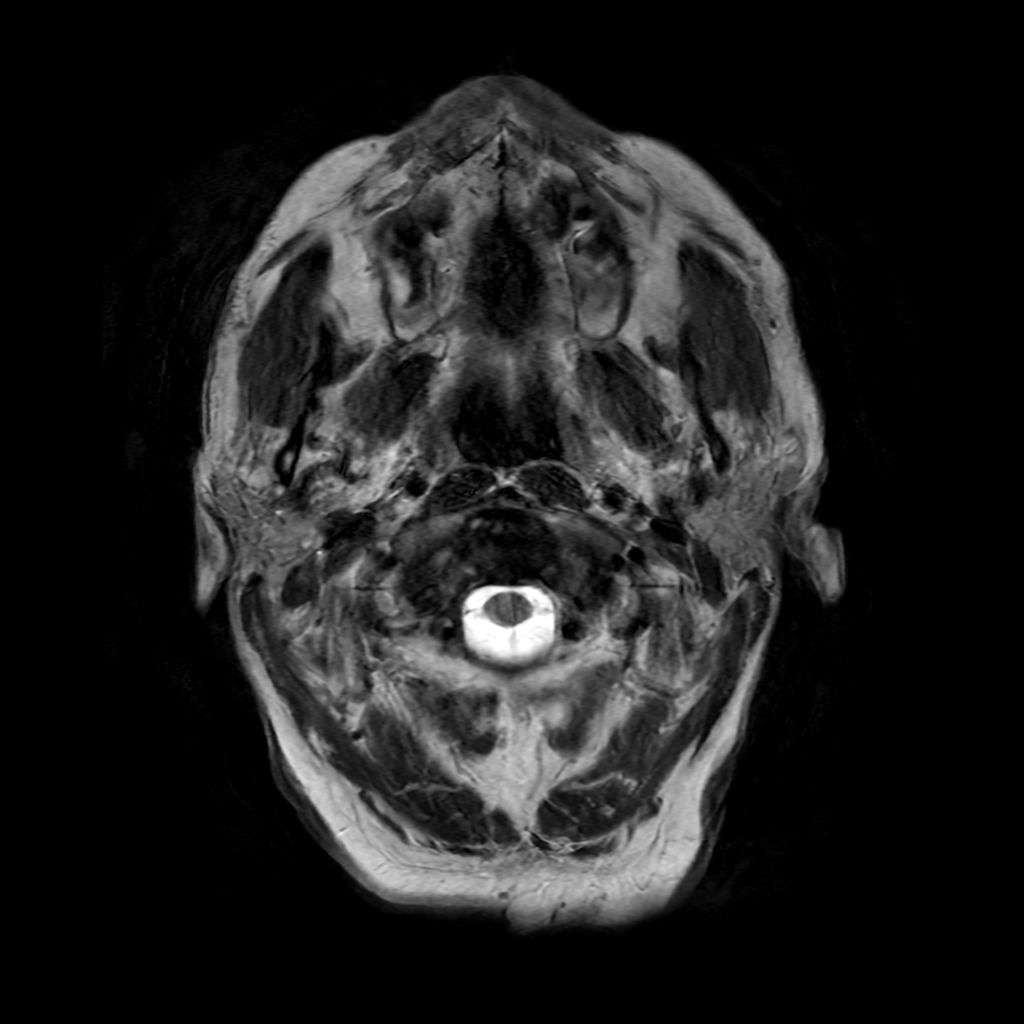
[im 28/28]
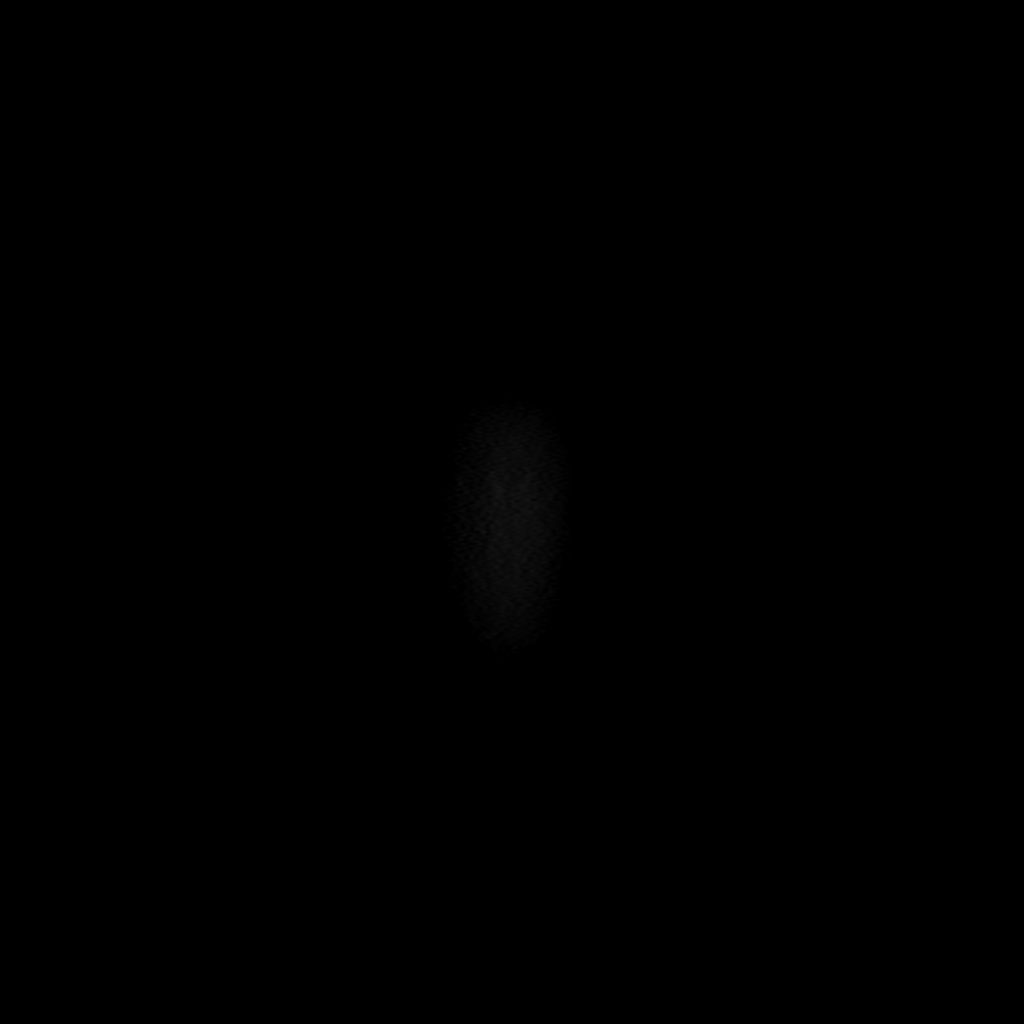

[Series 10: T2 post-contrast · coronal · 5.0mm · 0.43mm/px · 1 of 31 slices shown]
[im 1/31]
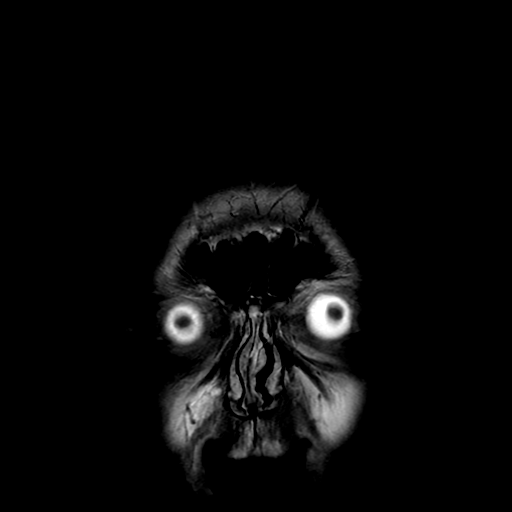

[Series 13: FLAIR post-contrast · sagittal · 5.0mm · 0.47mm/px · 2 of 25 slices shown]
[im 1/25]
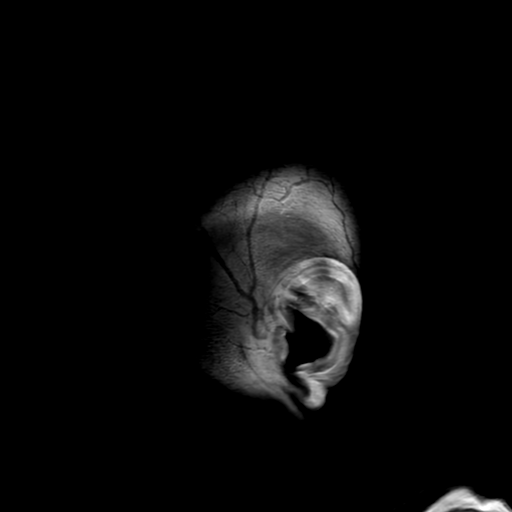
[im 25/25]
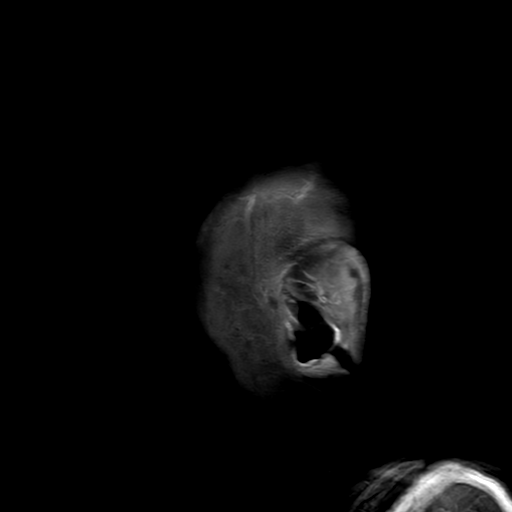

[Series 250: ADC · axial · 3.0mm · 0.94mm/px · z∈[-113,+47]mm · 3 of 55 slices shown (1 of 2)]
[im 1/55]
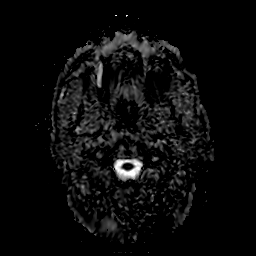
[im 28/55]
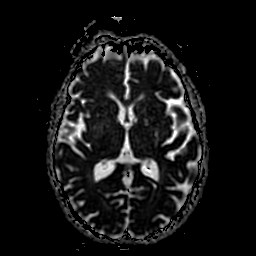
[im 55/55]
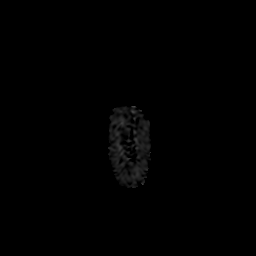

[Series 350: ADC · coronal · 4.0mm · 0.94mm/px · 2 of 37 slices shown (2 of 2)]
[im 1/37]
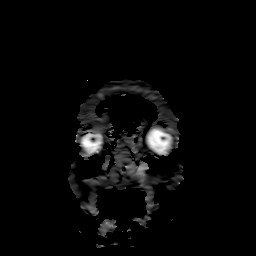
[im 37/37]
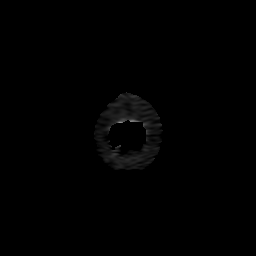

[25 of 48 positions shown; findings below may reference images not displayed]

FINDINGS: Brain: Multiple enhancing mass lesions are present in the brain.
Largest in the right posterior frontal lobe shows homogeneous
enhancement measuring 35 x 27 mm. The mass appears to infiltrate and
thicken the cortex with extensive adjacent vasogenic edema. This
corresponds to the primary CT abnormality

Additional smaller enhancing mass lesions are present. 6 mm
enhancing lesion right frontal lobe. 11 mm enhancing lesion left
frontal lobe with mild edema. 9 mm enhancing lesion left frontal
lobe. 3 mm enhancing lesion left temporoparietal periventricular
white matter. 12 mm enhancing lesion left posterior parietal lobe
with edema. These lesions show significant restricted diffusion. No
associated hemorrhage

Ventricle size normal.  No midline shift.

Vascular: Normal arterial flow voids.

Skull and upper cervical spine: No focal skeletal lesion.

Sinuses/Orbits: Mild mucosal edema paranasal sinuses.  Normal orbit

Other: None
IMPRESSION: Multiple enhancing mass lesions in the brain with surrounding edema
compatible with metastatic disease. Patient has history of melanoma.

## 2019-06-03 IMAGING — CT CT HEAD W/O CM
4 series · 16 of 47 positions shown, 18 images · non-contrast
Comparison: None.

CLINICAL DATA: Left arm weakness over the last week.

EXAM:
CT HEAD WITHOUT CONTRAST
TECHNIQUE: Contiguous axial images were obtained from the base of the skull
through the vertex without intravenous contrast.

[Series 3: head without · axial · non-contrast · 0.41mm/px · z∈[-130,-6]mm · 7 of 35 slices shown, 9 images]
[im 5/35  brain]
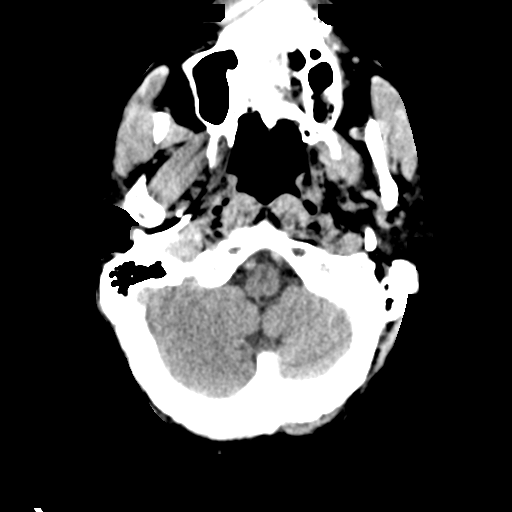
[im 5/35  bone]
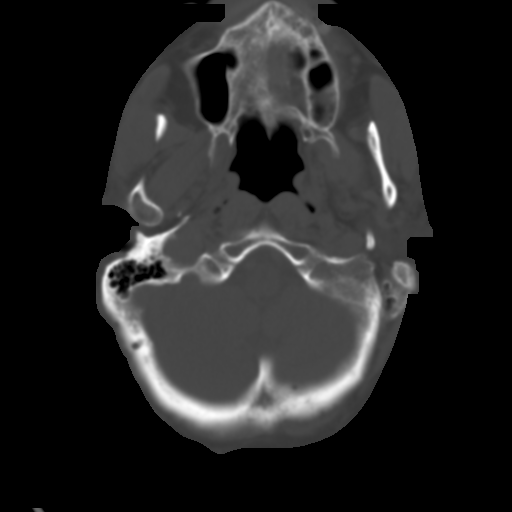
[im 9/35  brain]
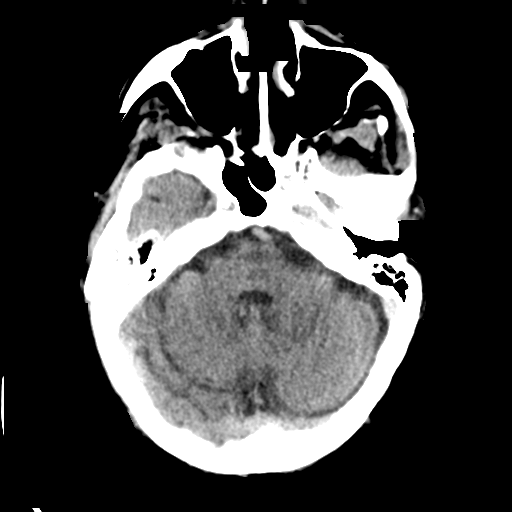
[im 13/35  brain]
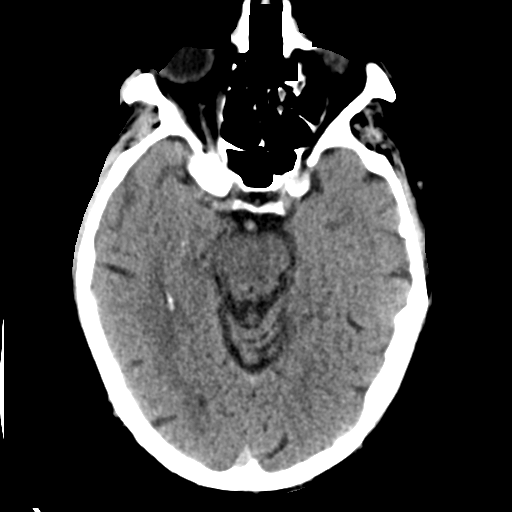
[im 18/35  brain]
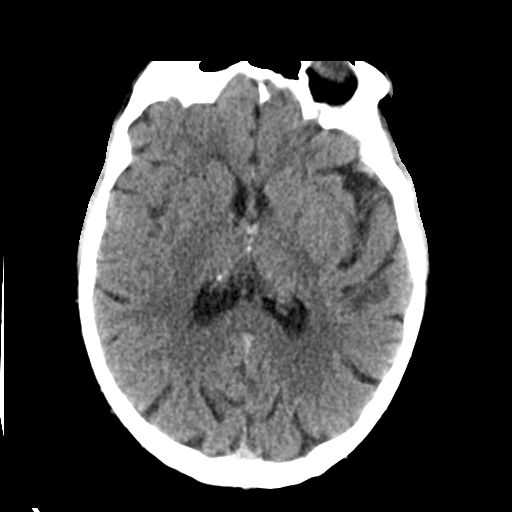
[im 22/35  brain]
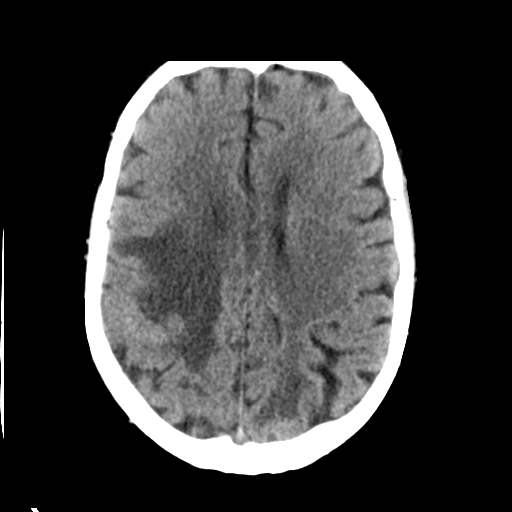
[im 22/35  bone]
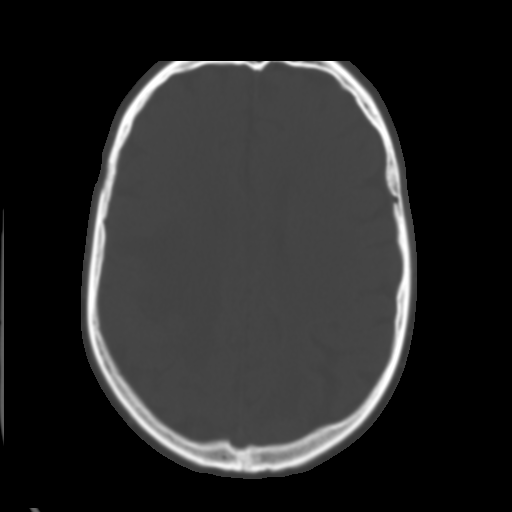
[im 26/35  brain]
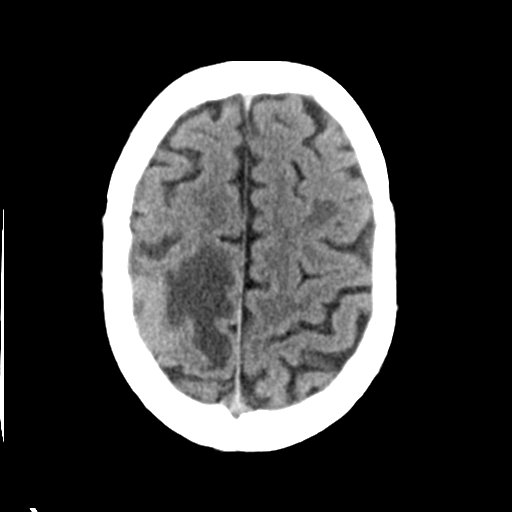
[im 30/35  brain]
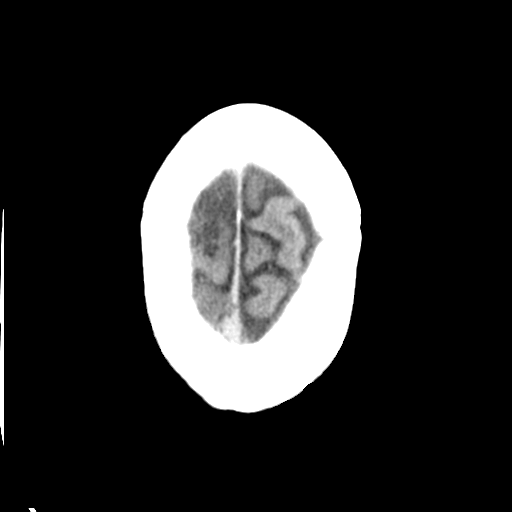

[Series 4: head bone · axial · 0.41mm/px · z∈[-134,-100]mm · 3 of 87 slices shown]
[im 9/87  bone]
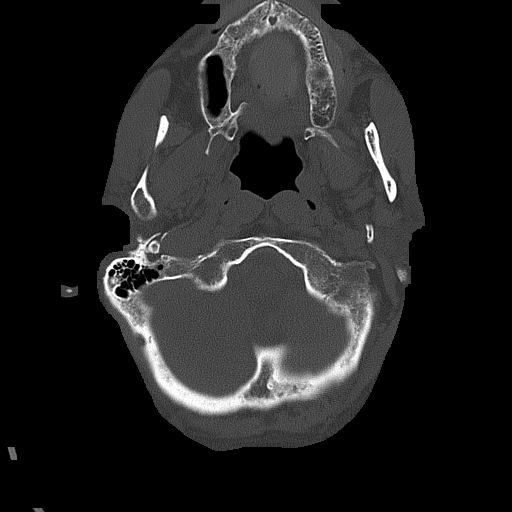
[im 18/87  bone]
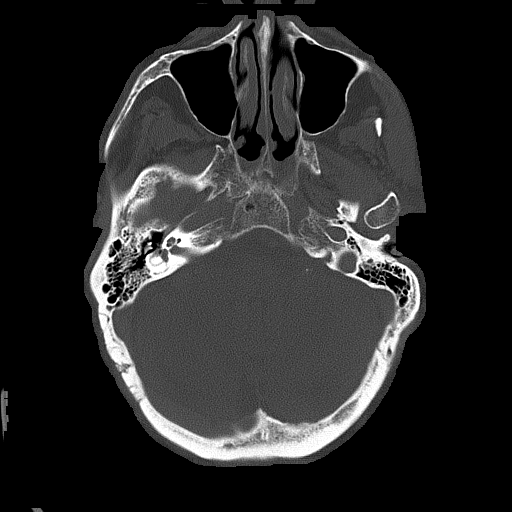
[im 26/87  bone]
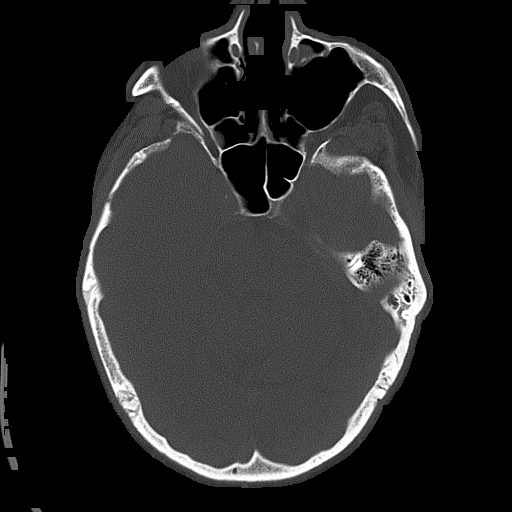

[Series 5: head without cor · coronal · non-contrast · 0.34mm/px · 3 of 69 slices shown]
[im 23/69  brain]
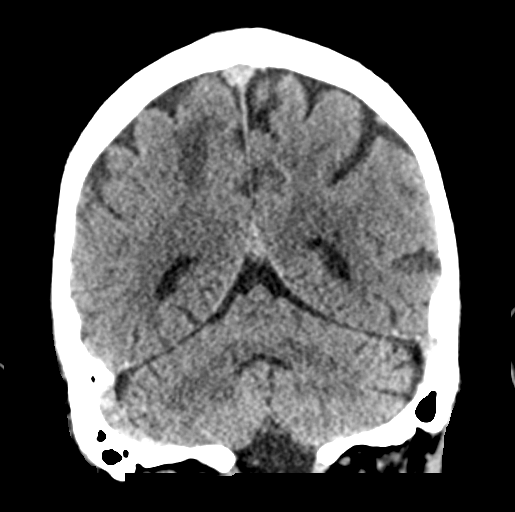
[im 31/69  brain]
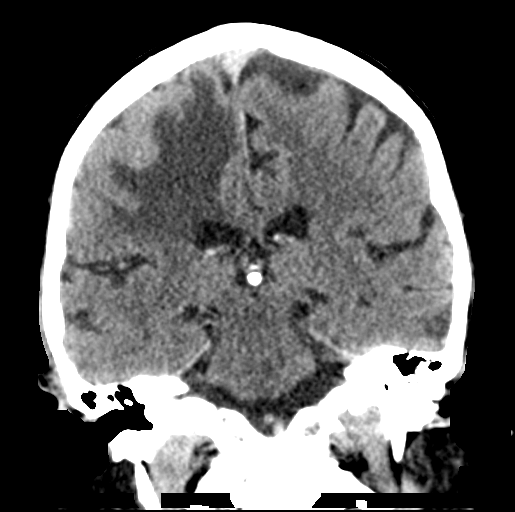
[im 38/69  brain]
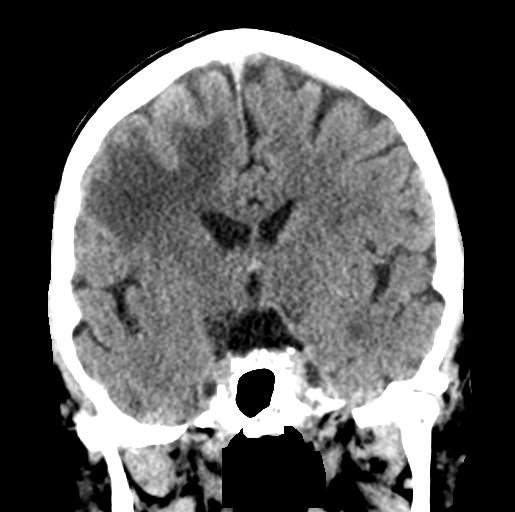

[Series 6: head without sag · sagittal · non-contrast · 0.35mm/px · 3 of 54 slices shown]
[im 18/54  brain]
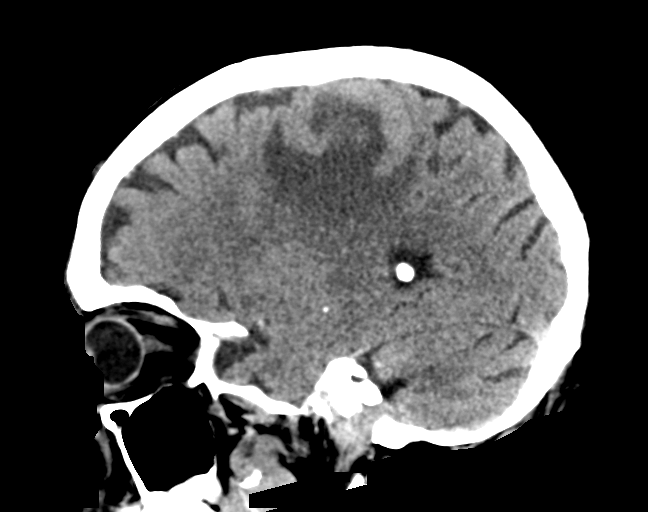
[im 27/54  brain]
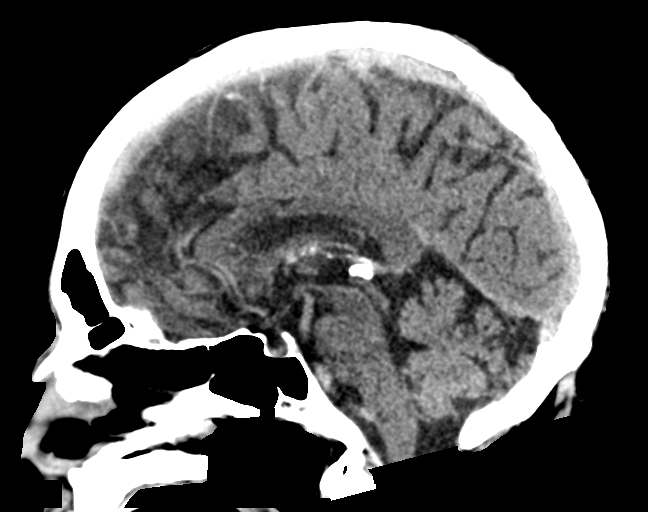
[im 36/54  brain]
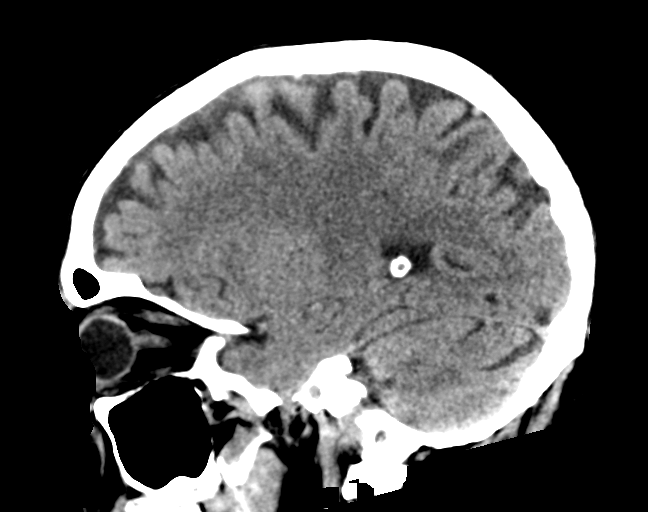

[16 of 47 positions shown; findings below may reference images not displayed]

FINDINGS: Brain: No abnormality is seen affecting the brainstem or cerebellum.
Left cerebral hemisphere shows subcortical white matter edema in the
left posterior parietal region and at the left frontal vertex. Right
cerebral hemisphere shows a large region of vasogenic edema within
the right frontoparietal white matter with intact overlying cortex.
Probable cortical thickening. These findings are not typical of
ordinary infarctions and raise the possibility of either metastatic
disease, multifocal primary brain malignancy or lymphoma. Cerebritis
could also be possible. Recommend MRI with and without contrast when
able. No hydrocephalus. No midline shift. No extra-axial fluid
collection.

Vascular: There is atherosclerotic calcification of the major
vessels at the base of the brain.

Skull: Negative

Sinuses/Orbits: Clear/normal

Other: None
IMPRESSION: Pronounced abnormality of the right frontoparietal region with
extensive vasogenic white matter edema and probable abnormal
cortical thickening and hyperdensity. Smaller area of vasogenic
edema within the left posterior parietal subcortical white matter
and the left frontal subcortical white matter. Most likely diagnosis
is occult metastatic disease. Multifocal primary brain malignancy
and lymphoma are also possible. Lastly, septic emboli/cerebritis are
considered. Recommend brain MRI with and without contrast.

## 2019-06-03 IMAGING — CT CT ABD-PELV W/ CM
2 of 5 series · 14 of 46 positions shown, 16 images · IV contrast (omnipaque)
Comparison: Chest radiograph report [DATE] brain MR of earlier
today.

CLINICAL DATA: Brain MR demonstrating metastatic disease. History
of melanoma 4 years ago.

EXAM:
CT CHEST, ABDOMEN, AND PELVIS WITH CONTRAST
TECHNIQUE: Multidetector CT imaging of the chest, abdomen and pelvis was
performed following the standard protocol during bolus
administration of intravenous contrast.
CONTRAST:  100mL OMNIPAQUE IOHEXOL 300 MG/ML  SOLN

[Series 3: cap with · axial · 0.71mm/px · z∈[+769,+1279]mm · 11 of 124 slices shown, 13 images]
[im 11/124  soft-tissue]
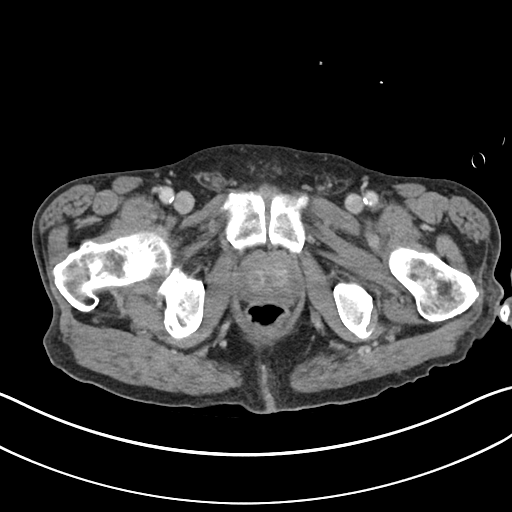
[im 11/124  bone]
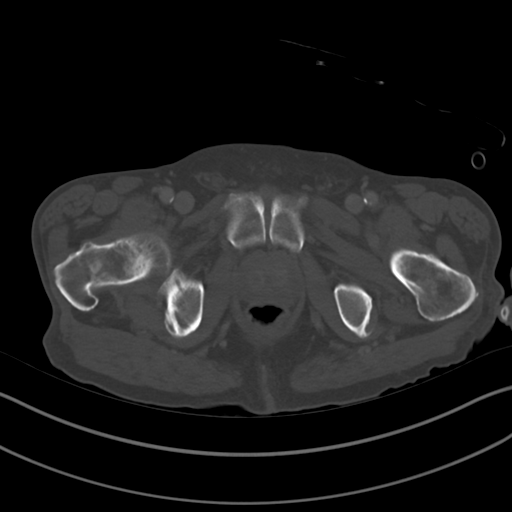
[im 21/124  soft-tissue]
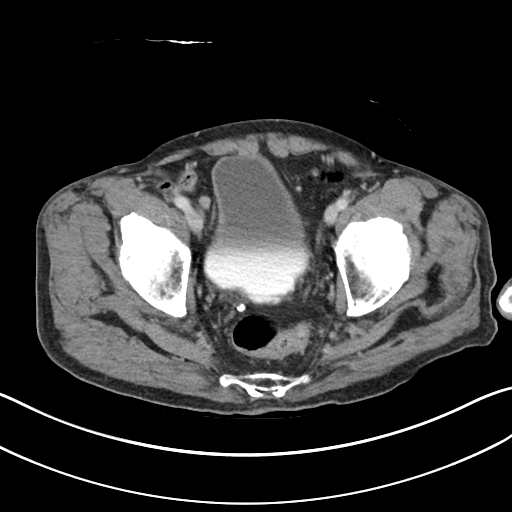
[im 31/124  soft-tissue]
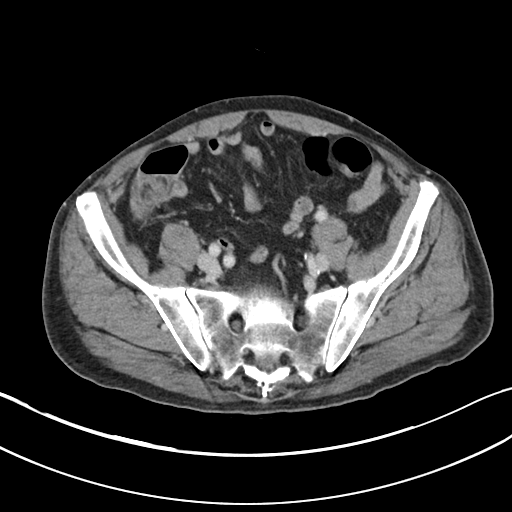
[im 42/124  soft-tissue]
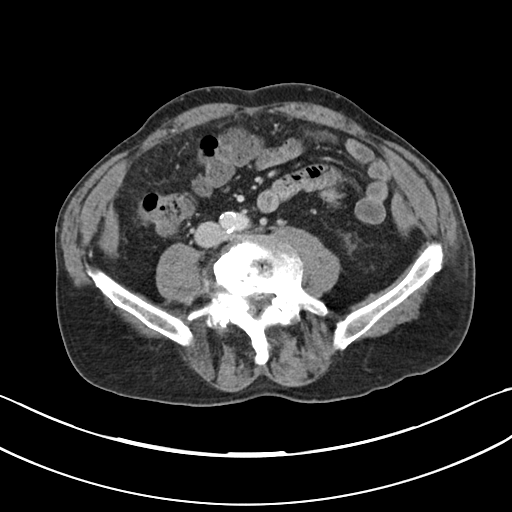
[im 52/124  soft-tissue]
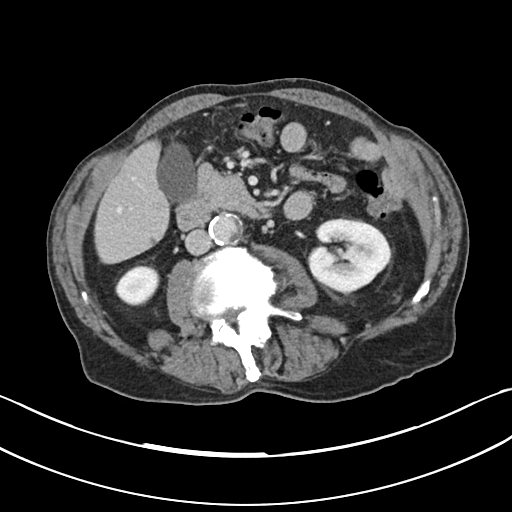
[im 62/124  soft-tissue]
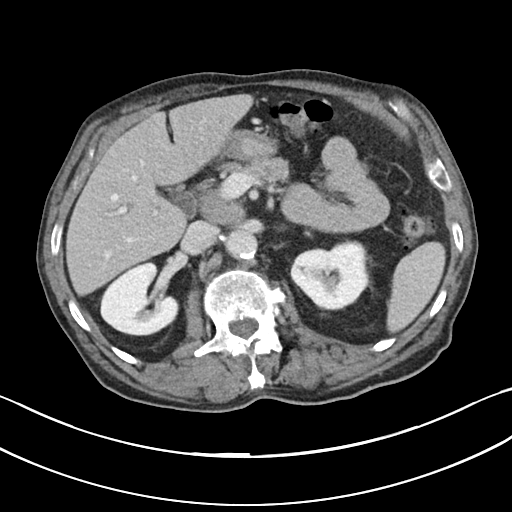
[im 72/124  soft-tissue]
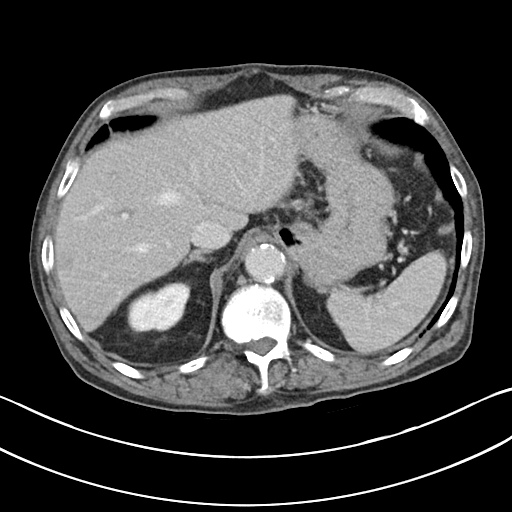
[im 83/124  soft-tissue]
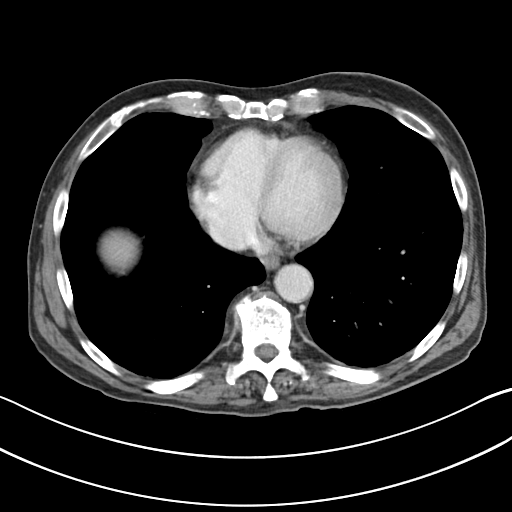
[im 93/124  soft-tissue]
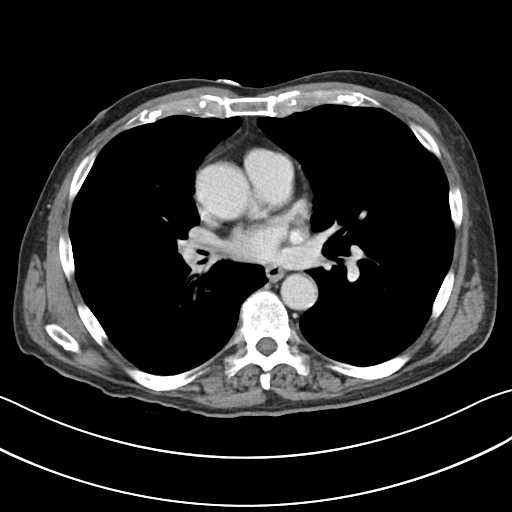
[im 93/124  bone]
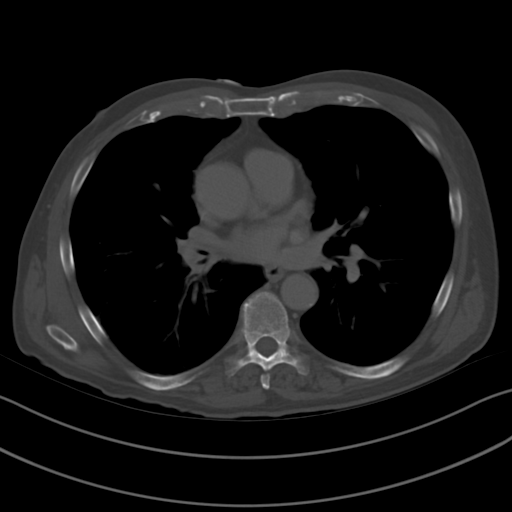
[im 103/124  soft-tissue]
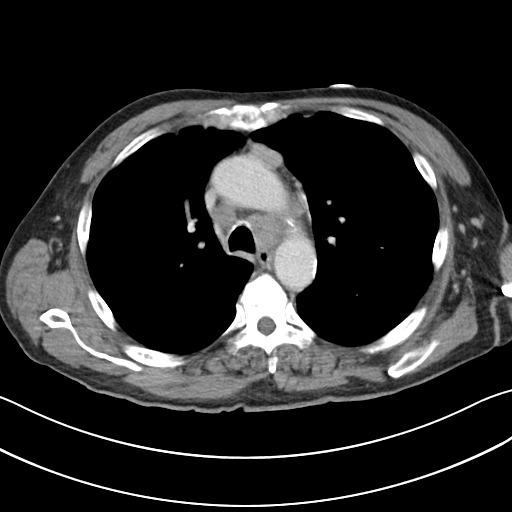
[im 113/124  soft-tissue]
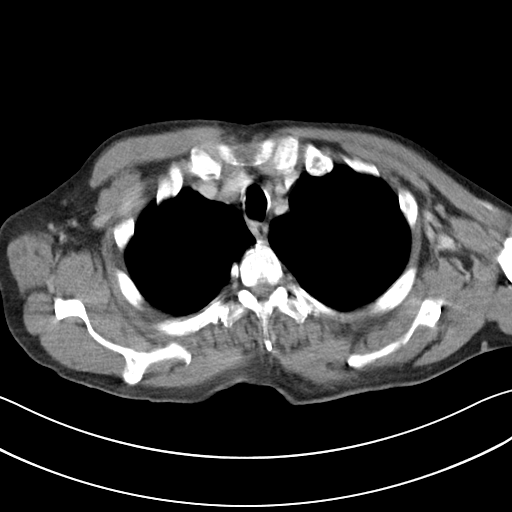

[Series 6: cor · coronal · 0.86mm/px · 3 of 85 slices shown]
[im 29/85  soft-tissue]
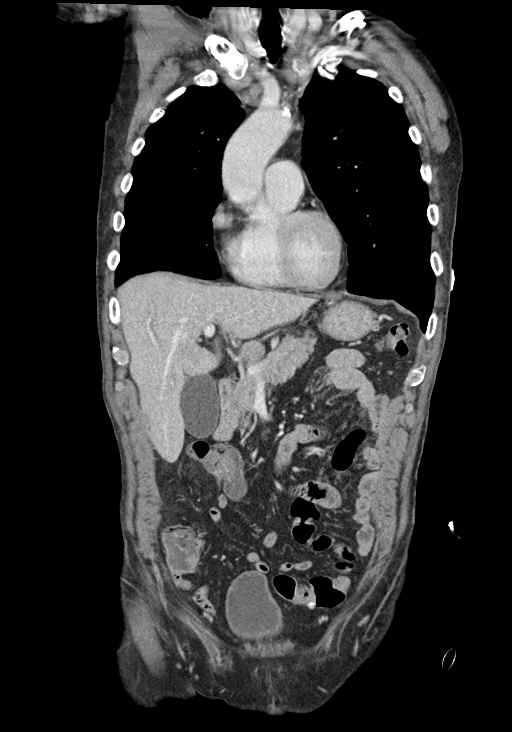
[im 38/85  soft-tissue]
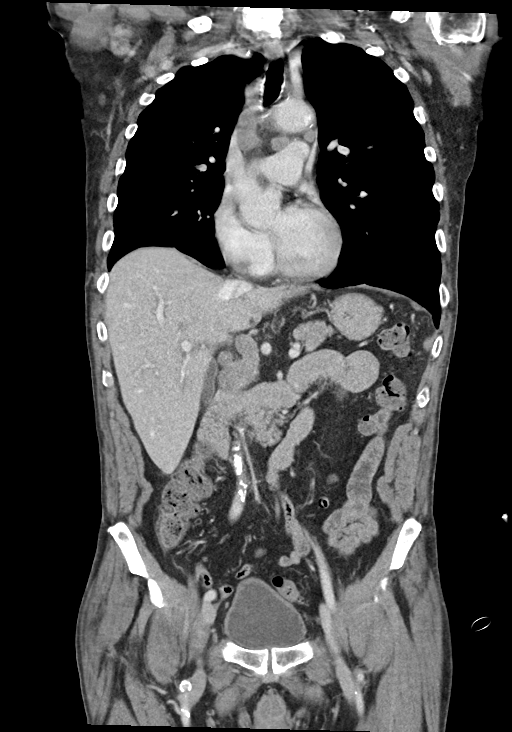
[im 47/85  soft-tissue]
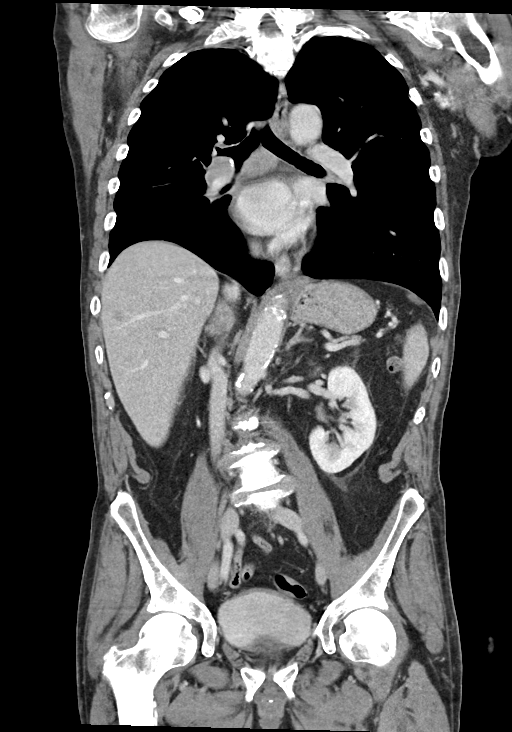

[14 of 46 positions shown; findings below may reference images not displayed]

FINDINGS: CT CHEST FINDINGS

Cardiovascular: Mild motion degradation involving the upper and mid
chest. Aortic and branch vessel atherosclerosis. Tortuous thoracic
aorta. Normal heart size, without pericardial effusion. No central
pulmonary embolism, on this non-dedicated study.

Mediastinum/Nodes: Extensive mediastinal adenopathy. Right
paratracheal node measures 1.6 cm on [DATE].

Subcarinal node measures 1.5 cm on [DATE].

Right hilar adenopathy at 1.6 cm on [DATE].

Prevascular node of 1.0 cm on [DATE].

Lungs/Pleura: No pleural fluid. Bronchial wall thickening is most
significant in the right middle and right lower lobes, including on
82/5. Centrilobular emphysema.

Right middle lobe 5 mm pulmonary nodule on 96/5.

Right lower lobe 1.0 cm nodule including on 84/5.

Likely calcified 2 mm posterior right upper lobe pulmonary nodule on
63/5.

Musculoskeletal: No acute osseous abnormality.

CT ABDOMEN PELVIS FINDINGS

Hepatobiliary: Vague hypoattenuating liver lesions bilaterally.
Example in the right hepatic lobe at 8 mm on 54/3 and within the
lateral segment left liver lobe 1.1 cm on 57/3.

Normal gallbladder, without biliary ductal dilatation.

Pancreas: Normal, without mass or ductal dilatation.

Spleen: Normal in size, without focal abnormality.

Adrenals/Urinary Tract: Normal adrenal glands. Normal left kidney.
Too small to characterize interpolar right renal lesion. No
hydronephrosis. Bladder wall irregularity with small saccules
posteriorly.

Stomach/Bowel: Proximal gastric underdistention. Scattered colonic
diverticula. Normal terminal ileum. Normal small bowel.

Vascular/Lymphatic: Advanced aortic and branch vessel
atherosclerosis. Porta hepatis adenopathy, including a nodal mass of
2.6 x 3.1 cm on 66/3.

No pelvic sidewall adenopathy.

Reproductive: Mild prostatomegaly.

Other: No significant free fluid. No evidence of omental or
peritoneal disease.

Musculoskeletal: Thoracolumbar spondylosis. S-shaped thoracolumbar
spine curvature.
IMPRESSION: 1. Thoracoabdominal adenopathy and liver lesions, favored to
represent metastatic disease. Given emphysema and small pulmonary
nodules, differential considerations include metastatic bronchogenic
carcinoma and melanoma.
2. Motion degraded evaluation of the upper chest.
3. Aortic atherosclerosis ([5K]-[5K]) and emphysema ([5K]-[5K]).
4. Prostatomegaly with a component of bladder outlet obstruction.

## 2019-06-03 MED ORDER — DEXAMETHASONE SODIUM PHOSPHATE 10 MG/ML IJ SOLN
20.0000 mg | Freq: Every day | INTRAMUSCULAR | Status: DC
  Administered 2019-06-04: 20 mg via INTRAVENOUS
  Filled 2019-06-03 (×2): qty 2

## 2019-06-03 MED ORDER — IOHEXOL 300 MG/ML  SOLN
100.0000 mL | Freq: Once | INTRAMUSCULAR | Status: AC | PRN
  Administered 2019-06-03: 100 mL via INTRAVENOUS

## 2019-06-03 MED ORDER — SODIUM CHLORIDE 0.9 % IV SOLN: INTRAVENOUS | Status: DC

## 2019-06-03 MED ORDER — LORAZEPAM 2 MG/ML IJ SOLN
2.0000 mg | Freq: Once | INTRAMUSCULAR | Status: AC
  Administered 2019-06-03: 2 mg via INTRAVENOUS
  Filled 2019-06-03: qty 1

## 2019-06-03 MED ORDER — ONDANSETRON HCL 4 MG PO TABS: 4.0000 mg | ORAL_TABLET | Freq: Four times a day (QID) | ORAL | Status: DC | PRN

## 2019-06-03 MED ORDER — THIAMINE HCL 100 MG PO TABS
100.0000 mg | ORAL_TABLET | Freq: Every day | ORAL | Status: DC
  Administered 2019-06-03 – 2019-06-04 (×2): 100 mg via ORAL
  Filled 2019-06-03 (×2): qty 1

## 2019-06-03 MED ORDER — LORAZEPAM 2 MG/ML IJ SOLN: 1.0000 mg | INTRAMUSCULAR | Status: DC | PRN

## 2019-06-03 MED ORDER — ACETAMINOPHEN 325 MG PO TABS: 650.0000 mg | ORAL_TABLET | Freq: Four times a day (QID) | ORAL | Status: DC | PRN

## 2019-06-03 MED ORDER — ADULT MULTIVITAMIN W/MINERALS CH
1.0000 | ORAL_TABLET | Freq: Every day | ORAL | Status: DC
  Administered 2019-06-03 – 2019-06-04 (×2): 1 via ORAL
  Filled 2019-06-03 (×2): qty 1

## 2019-06-03 MED ORDER — SODIUM CHLORIDE 0.9% FLUSH: 3.0000 mL | Freq: Once | INTRAVENOUS | Status: DC

## 2019-06-03 MED ORDER — ACETAMINOPHEN 650 MG RE SUPP: 650.0000 mg | Freq: Four times a day (QID) | RECTAL | Status: DC | PRN

## 2019-06-03 MED ORDER — ONDANSETRON HCL 4 MG/2ML IJ SOLN: 4.0000 mg | Freq: Four times a day (QID) | INTRAMUSCULAR | Status: DC | PRN

## 2019-06-03 MED ORDER — FOLIC ACID 1 MG PO TABS
1.0000 mg | ORAL_TABLET | Freq: Every day | ORAL | Status: DC
  Administered 2019-06-03 – 2019-06-04 (×2): 1 mg via ORAL
  Filled 2019-06-03 (×2): qty 1

## 2019-06-03 MED ORDER — GADOBUTROL 1 MMOL/ML IV SOLN
6.0000 mL | Freq: Once | INTRAVENOUS | Status: AC | PRN
  Administered 2019-06-03: 6 mL via INTRAVENOUS

## 2019-06-03 MED ORDER — LORAZEPAM 1 MG PO TABS: 1.0000 mg | ORAL_TABLET | ORAL | Status: DC | PRN

## 2019-06-03 MED ORDER — DEXAMETHASONE SODIUM PHOSPHATE 10 MG/ML IJ SOLN
40.0000 mg | Freq: Once | INTRAMUSCULAR | Status: AC
  Administered 2019-06-03: 40 mg via INTRAVENOUS
  Filled 2019-06-03: qty 4

## 2019-06-03 MED ORDER — MORPHINE SULFATE (PF) 2 MG/ML IV SOLN: 2.0000 mg | INTRAVENOUS | Status: DC | PRN

## 2019-06-03 MED ORDER — ENOXAPARIN SODIUM 40 MG/0.4ML ~~LOC~~ SOLN
40.0000 mg | Freq: Every day | SUBCUTANEOUS | Status: DC
  Administered 2019-06-04: 40 mg via SUBCUTANEOUS
  Filled 2019-06-03: qty 0.4

## 2019-06-03 MED ORDER — THIAMINE HCL 100 MG/ML IJ SOLN: 100.0000 mg | Freq: Every day | INTRAMUSCULAR | Status: DC

## 2019-06-03 NOTE — ED Notes (Addendum)
Wife -- Jackie--[phone]-- call when back from MRI.

## 2019-06-03 NOTE — ED Notes (Signed)
Patient transported to CT 

## 2019-06-03 NOTE — ED Notes (Addendum)
RN talked with pt about importance of procedure and discussed that meds can help him. He drinks alcohol 3-4 times a week and states "I think I can get through it feeling 4 drinks in." RN discussed with MD and MRI. MRI to call when OTW so pt can be medicated.

## 2019-06-03 NOTE — Progress Notes (Signed)
Arrived to 6n14 at this time.

## 2019-06-03 NOTE — Progress Notes (Signed)
Pt came for his MRI and has never had one before, once he got into the scanner he had a panic attack and realized he is claustrophobic. He refused to continue even with medicine. Pt sent back to his room. RN aware

## 2019-06-03 NOTE — H&P (Signed)
History and Physical   Walter Hernandez GQB:[id number] DOB: 1951-03-01 DOA: 06/03/2019  Referring MD/NP/PA: Dr. Laverta Baltimore  PCP: Lavone Orn, MD   Outpatient Specialists: Dr. Marin Olp  Patient coming from: Home  Chief Complaint: Left sided weakness  HPI: Walter Hernandez is a 68 y.o. male with medical history significant of malignant melanoma of the skin which was excised in 2016, GERD, hypertension who has been doing fine until the last few days when he noted left-sided weakness as well as inability to use his hand.  Symptoms got worse about 2 days ago.  No chest pain.  No nausea vomiting or diarrhea.  Patient was initially suspected to have had a stroke.  He was admitted for evaluation.  While in the ER work-up including scan showed multiple brain lesions suspected to be malignant in nature.  Oncology consulted and recommendation is to admit the patient initiate steroids and work him up for possible metastatic brain lesions from a primary to be determined.  He is still weak on the left.  But fully awake and alert no significant confusion..  ED Course: Temperature 98.8 blood pressure 153/97 pulse 112 respirate 24 oxygen sat 94% on room air.  CBC and chemistry all within normal.  COVID-19 screen is negative.  CT abdomen and thorax shows multiple diffuse lymphadenopathy as well as liver lesions consistent with possible metastatic disease.  MRI of the brain also showed multiple brain mets.  Patient being admitted initiated on steroids and to be followed by oncology.  Review of Systems: As per HPI otherwise 10 point review of systems negative.    Past Medical History:  Diagnosis Date  . Cancer [facility])    recently discovered melanoma  . GERD (gastroesophageal reflux disease)   . History of hiatal hernia   . Hypertension    recently placed on new blood pressure med  . Malignant melanoma of skin of left lower leg ([place]) 07/28/2015    Past Surgical History:  Procedure Laterality Date  . CARDIAC  CATHETERIZATION     15 yrs ago...came out normal   -- hiatal hernia  . MELANOMA EXCISION WITH SENTINEL LYMPH NODE BIOPSY Left 09/29/2014   Procedure: WIDE EXCISION MELANOMA LEFT CALF WITH LEFT INGUINAL  SENTINEL LYMPH NODE BIOPSY;  Surgeon: Georganna Skeans, MD;  Location: [facility];  Service: General;  Laterality: Left;  . skin lesion excised     non cancerous     reports that he has been smoking cigarettes. He has a 50.00 pack-year smoking history. He has never used smokeless tobacco. He reports that he does not use drugs. No history on file for alcohol.  No Known Allergies  No family history on file.   Prior to Admission medications   Medication Sig Start Date End Date Taking? Authorizing Provider  acetaminophen (TYLENOL) 500 MG tablet Take 1,000 mg by mouth every 6 (six) hours as needed for moderate pain.   Yes [provider]  amLODipine (NORVASC) 5 MG tablet Take 5 mg by mouth daily.    Yes [provider]  IBUPROFEN & ACETAMINOPHEN PO Take 1-2 tablets by mouth every 6 (six) hours as needed (pain).   Yes [provider]  Multiple Vitamin (MULTIVITAMIN) capsule Take 1 capsule by mouth daily.   Yes [provider]  Multiple Vitamins-Minerals (EMERGEN-C IMMUNE PLUS) PACK Take 1 Package by mouth daily as needed (immune system).   Yes [provider]  Naphazoline-Pheniramine (OPCON-A OP) Place 2 drops into both eyes as needed (for dry eyes).  Yes [provider]  omeprazole (PRILOSEC) 40 MG capsule Take 40 mg by mouth daily.   Yes [provider]  tamsulosin (FLOMAX) 0.4 MG CAPS capsule Take 0.4 mg by mouth daily. 07/17/15  Yes [provider]    Physical Exam: Vitals:   06/03/19 1655 06/03/19 1833 06/03/19 1900 06/03/19 2229  BP: 137/87 112/64 123/76 (!) 153/72  Pulse: 100 87  86  Resp:  18  18  Temp:    98.8 F (37.1 C)  TempSrc:    Oral  SpO2:  94%  95%      Constitutional: Acutely ill looking, no acute  distress Vitals:   06/03/19 1655 06/03/19 1833 06/03/19 1900 06/03/19 2229  BP: 137/87 112/64 123/76 (!) 153/72  Pulse: 100 87  86  Resp:  18  18  Temp:    98.8 F (37.1 C)  TempSrc:    Oral  SpO2:  94%  95%   Eyes: PERRL, lids and conjunctivae normal ENMT: Mucous membranes are moist. Posterior pharynx clear of any exudate or lesions.Normal dentition.  Neck: normal, supple, no masses, no thyromegaly Respiratory: clear to auscultation bilaterally, no wheezing, no crackles. Normal respiratory effort. No accessory muscle use.  Cardiovascular: Sinus tachycardia, no murmurs / rubs / gallops. No extremity edema. 2+ pedal pulses. No carotid bruits.  Abdomen: no tenderness, no masses palpated. No hepatosplenomegaly. Bowel sounds positive.  Musculoskeletal: no clubbing / cyanosis. No joint deformity upper and lower extremities. Good ROM, no contractures. Normal muscle tone.  Skin: no rashes, lesions, ulcers. No induration Neurologic: CN 2-12 grossly intact. Sensation intact,   Left upper extremity power 3 out of 5, 4 out of 5 in the left lower extremity, brisk reflexes Psychiatric: Normal judgment and insight. Alert and oriented x 3. Normal mood.     Labs on Admission: I have personally reviewed following labs and imaging studies  CBC: Recent Labs  Lab 06/03/19 1227 06/03/19 1233  WBC 9.2  --   NEUTROABS 6.6  --   HGB 16.1 16.7  HCT 47.6 49.0  MCV 100.6*  --   PLT 255  --    Basic Metabolic Panel: Recent Labs  Lab 06/03/19 1227 06/03/19 1233  NA 136 135  K 4.1 3.8  CL 98 99  CO2 24  --   GLUCOSE 101* 99  BUN 7* 8  CREATININE 1.00 0.50*  CALCIUM 9.7  --    GFR: CrCl cannot be calculated (Unknown ideal weight.). Liver Function Tests: Recent Labs  Lab 06/03/19 1227  AST 28  ALT 12  ALKPHOS 62  BILITOT 1.0  PROT 7.1  ALBUMIN 3.9   No results for input(s): LIPASE, AMYLASE in the last 168 hours. No results for input(s): AMMONIA in the last 168 hours. Coagulation  Profile: Recent Labs  Lab 06/03/19 1227  INR 0.9   Cardiac Enzymes: No results for input(s): CKTOTAL, CKMB, CKMBINDEX, TROPONINI in the last 168 hours. BNP (last 3 results) No results for input(s): PROBNP in the last 8760 hours. HbA1C: No results for input(s): HGBA1C in the last 72 hours. CBG: Recent Labs  Lab 06/03/19 1453  GLUCAP 104*   Lipid Profile: No results for input(s): CHOL, HDL, LDLCALC, TRIG, CHOLHDL, LDLDIRECT in the last 72 hours. Thyroid Function Tests: No results for input(s): TSH, T4TOTAL, FREET4, T3FREE, THYROIDAB in the last 72 hours. Anemia Panel: No results for input(s): VITAMINB12, FOLATE, FERRITIN, TIBC, IRON, RETICCTPCT in the last 72 hours. Urine analysis: No results found for: COLORURINE, APPEARANCEUR, [place], [place],  GLUCOSEU, HGBUR, BILIRUBINUR, KETONESUR, PROTEINUR, UROBILINOGEN, NITRITE, LEUKOCYTESUR Sepsis Labs: @LABRCNTIP (procalcitonin:4,lacticidven:4) ) Recent Results (from the past 240 hour(s))  SARS CORONAVIRUS 2 (TAT 6-24 HRS) Nasopharyngeal Nasopharyngeal Swab     Status: None   Collection Time: 06/03/19  6:13 PM   Specimen: Nasopharyngeal Swab  Result Value Ref Range Status   SARS Coronavirus 2 NEGATIVE NEGATIVE Final    Comment: (NOTE) SARS-CoV-2 target nucleic acids are NOT DETECTED. The SARS-CoV-2 RNA is generally detectable in upper and lower respiratory specimens during the acute phase of infection. Negative results do not preclude SARS-CoV-2 infection, do not rule out co-infections with other pathogens, and should not be used as the sole basis for treatment or other patient management decisions. Negative results must be combined with clinical observations, patient history, and epidemiological information. The expected result is Negative. Fact Sheet for Patients: SugarRoll.be Fact Sheet for Healthcare Providers: https://www.woods-mathews.com/ This test is not yet approved or cleared by  the [place] FDA and  has been authorized for detection and/or diagnosis of SARS-CoV-2 by FDA under an Emergency Use Authorization (EUA). This EUA will remain  in effect (meaning this test can be used) for the duration of the COVID-19 declaration under Section 56 4(b)(1) of the Act, 21 U.S.C. section 360bbb-3(b)(1), unless the authorization is terminated or revoked sooner. Performed at [facility], [place] [address] [place], [place] [address]      Radiological Exams on Admission: CT HEAD WO CONTRAST  Result Date: 06/03/2019 CLINICAL DATA:  Left arm weakness over the last week. EXAM: CT HEAD WITHOUT CONTRAST TECHNIQUE: Contiguous axial images were obtained from the base of the skull through the vertex without intravenous contrast. COMPARISON:  None. FINDINGS: Brain: No abnormality is seen affecting the brainstem or cerebellum. Left cerebral hemisphere shows subcortical white matter edema in the left posterior parietal region and at the left frontal vertex. Right cerebral hemisphere shows a large region of vasogenic edema within the right frontoparietal white matter with intact overlying cortex. Probable cortical thickening. These findings are not typical of ordinary infarctions and raise the possibility of either metastatic disease, multifocal primary brain malignancy or lymphoma. Cerebritis could also be possible. Recommend MRI with and without contrast when able. No hydrocephalus. No midline shift. No extra-axial fluid collection. Vascular: There is atherosclerotic calcification of the major vessels at the base of the brain. Skull: Negative Sinuses/Orbits: Clear/normal Other: None IMPRESSION: Pronounced abnormality of the right frontoparietal region with extensive vasogenic white matter edema and probable abnormal cortical thickening and hyperdensity. Smaller area of vasogenic edema within the left posterior parietal subcortical white matter and the left frontal subcortical white matter.  Most likely diagnosis is occult metastatic disease. Multifocal primary brain malignancy and lymphoma are also possible. Lastly, septic emboli/cerebritis are considered. Recommend brain MRI with and without contrast. Electronically Signed   By: Nelson Chimes M.D.   On: 06/03/2019 13:24   CT Chest W Contrast  Result Date: 06/03/2019 CLINICAL DATA:  Brain MR demonstrating metastatic disease. History of melanoma 4 years ago. EXAM: CT CHEST, ABDOMEN, AND PELVIS WITH CONTRAST TECHNIQUE: Multidetector CT imaging of the chest, abdomen and pelvis was performed following the standard protocol during bolus administration of intravenous contrast. CONTRAST:  117mL OMNIPAQUE IOHEXOL 300 MG/ML  SOLN COMPARISON:  Chest radiograph report 04/29/99 brain MR of earlier today. FINDINGS: CT CHEST FINDINGS Cardiovascular: Mild motion degradation involving the upper and mid chest. Aortic and branch vessel atherosclerosis. Tortuous thoracic aorta. Normal heart size, without pericardial effusion. No central pulmonary embolism, on  this non-dedicated study. Mediastinum/Nodes: Extensive mediastinal adenopathy. Right paratracheal node measures 1.6 cm on 18/3. Subcarinal node measures 1.5 cm on 29/3. Right hilar adenopathy at 1.6 cm on 30/3. Prevascular node of 1.0 cm on 22/3. Lungs/Pleura: No pleural fluid. Bronchial wall thickening is most significant in the right middle and right lower lobes, including on 82/5. Centrilobular emphysema. Right middle lobe 5 mm pulmonary nodule on 96/5. Right lower lobe 1.0 cm nodule including on 84/5. Likely calcified 2 mm posterior right upper lobe pulmonary nodule on 63/5. Musculoskeletal: No acute osseous abnormality. CT ABDOMEN PELVIS FINDINGS Hepatobiliary: Vague hypoattenuating liver lesions bilaterally. Example in the right hepatic lobe at 8 mm on 54/3 and within the lateral segment left liver lobe 1.1 cm on 57/3. Normal gallbladder, without biliary ductal dilatation. Pancreas: Normal, without mass or  ductal dilatation. Spleen: Normal in size, without focal abnormality. Adrenals/Urinary Tract: Normal adrenal glands. Normal left kidney. Too small to characterize interpolar right renal lesion. No hydronephrosis. Bladder wall irregularity with small saccules posteriorly. Stomach/Bowel: Proximal gastric underdistention. Scattered colonic diverticula. Normal terminal ileum. Normal small bowel. Vascular/Lymphatic: Advanced aortic and branch vessel atherosclerosis. Porta hepatis adenopathy, including a nodal mass of 2.6 x 3.1 cm on 66/3. No pelvic sidewall adenopathy. Reproductive: Mild prostatomegaly. Other: No significant free fluid. No evidence of omental or peritoneal disease. Musculoskeletal: Thoracolumbar spondylosis. S-shaped thoracolumbar spine curvature. IMPRESSION: 1. Thoracoabdominal adenopathy and liver lesions, favored to represent metastatic disease. Given emphysema and small pulmonary nodules, differential considerations include metastatic bronchogenic carcinoma and melanoma. 2. Motion degraded evaluation of the upper chest. 3. Aortic atherosclerosis (ICD10-I70.0) and emphysema (ICD10-J43.9). 4. Prostatomegaly with a component of bladder outlet obstruction. Electronically Signed   By: Abigail Miyamoto M.D.   On: 06/03/2019 20:02   MR Brain W and Wo Contrast  Result Date: 06/03/2019 CLINICAL DATA:  Left arm weakness.  Abnormal CT head EXAM: MRI HEAD WITHOUT AND WITH CONTRAST TECHNIQUE: Multiplanar, multiecho pulse sequences of the brain and surrounding structures were obtained without and with intravenous contrast. CONTRAST:  49mL GADAVIST GADOBUTROL 1 MMOL/ML IV SOLN COMPARISON:  CT head 06/03/2019 FINDINGS: Brain: Multiple enhancing mass lesions are present in the brain. Largest in the right posterior frontal lobe shows homogeneous enhancement measuring 35 x 27 mm. The mass appears to infiltrate and thicken the cortex with extensive adjacent vasogenic edema. This corresponds to the primary CT abnormality  Additional smaller enhancing mass lesions are present. 6 mm enhancing lesion right frontal lobe. 11 mm enhancing lesion left frontal lobe with mild edema. 9 mm enhancing lesion left frontal lobe. 3 mm enhancing lesion left temporoparietal periventricular white matter. 12 mm enhancing lesion left posterior parietal lobe with edema. These lesions show significant restricted diffusion. No associated hemorrhage Ventricle size normal.  No midline shift. Vascular: Normal arterial flow voids. Skull and upper cervical spine: No focal skeletal lesion. Sinuses/Orbits: Mild mucosal edema paranasal sinuses.  Normal orbit Other: None IMPRESSION: Multiple enhancing mass lesions in the brain with surrounding edema compatible with metastatic disease. Patient has history of melanoma. Electronically Signed   By: Franchot Gallo M.D.   On: 06/03/2019 18:13   CT ABDOMEN PELVIS W CONTRAST  Result Date: 06/03/2019 CLINICAL DATA:  Brain MR demonstrating metastatic disease. History of melanoma 4 years ago. EXAM: CT CHEST, ABDOMEN, AND PELVIS WITH CONTRAST TECHNIQUE: Multidetector CT imaging of the chest, abdomen and pelvis was performed following the standard protocol during bolus administration of intravenous contrast. CONTRAST:  147mL OMNIPAQUE IOHEXOL 300 MG/ML  SOLN COMPARISON:  Chest radiograph  report 04/29/99 brain MR of earlier today. FINDINGS: CT CHEST FINDINGS Cardiovascular: Mild motion degradation involving the upper and mid chest. Aortic and branch vessel atherosclerosis. Tortuous thoracic aorta. Normal heart size, without pericardial effusion. No central pulmonary embolism, on this non-dedicated study. Mediastinum/Nodes: Extensive mediastinal adenopathy. Right paratracheal node measures 1.6 cm on 18/3. Subcarinal node measures 1.5 cm on 29/3. Right hilar adenopathy at 1.6 cm on 30/3. Prevascular node of 1.0 cm on 22/3. Lungs/Pleura: No pleural fluid. Bronchial wall thickening is most significant in the right middle and  right lower lobes, including on 82/5. Centrilobular emphysema. Right middle lobe 5 mm pulmonary nodule on 96/5. Right lower lobe 1.0 cm nodule including on 84/5. Likely calcified 2 mm posterior right upper lobe pulmonary nodule on 63/5. Musculoskeletal: No acute osseous abnormality. CT ABDOMEN PELVIS FINDINGS Hepatobiliary: Vague hypoattenuating liver lesions bilaterally. Example in the right hepatic lobe at 8 mm on 54/3 and within the lateral segment left liver lobe 1.1 cm on 57/3. Normal gallbladder, without biliary ductal dilatation. Pancreas: Normal, without mass or ductal dilatation. Spleen: Normal in size, without focal abnormality. Adrenals/Urinary Tract: Normal adrenal glands. Normal left kidney. Too small to characterize interpolar right renal lesion. No hydronephrosis. Bladder wall irregularity with small saccules posteriorly. Stomach/Bowel: Proximal gastric underdistention. Scattered colonic diverticula. Normal terminal ileum. Normal small bowel. Vascular/Lymphatic: Advanced aortic and branch vessel atherosclerosis. Porta hepatis adenopathy, including a nodal mass of 2.6 x 3.1 cm on 66/3. No pelvic sidewall adenopathy. Reproductive: Mild prostatomegaly. Other: No significant free fluid. No evidence of omental or peritoneal disease. Musculoskeletal: Thoracolumbar spondylosis. S-shaped thoracolumbar spine curvature. IMPRESSION: 1. Thoracoabdominal adenopathy and liver lesions, favored to represent metastatic disease. Given emphysema and small pulmonary nodules, differential considerations include metastatic bronchogenic carcinoma and melanoma. 2. Motion degraded evaluation of the upper chest. 3. Aortic atherosclerosis (ICD10-I70.0) and emphysema (ICD10-J43.9). 4. Prostatomegaly with a component of bladder outlet obstruction. Electronically Signed   By: Abigail Miyamoto M.D.   On: 06/03/2019 20:02      Assessment/Plan Principal Problem:   Brain metastases ([place]) Active Problems:   Malignant melanoma of  skin of left lower leg ([facility])   Essential hypertension   GERD (gastroesophageal reflux disease)     #1 multiple brain lesions: Patient will be admitted and monitored.  Initiate IV Solu-Medrol per oncology.  To be seen by oncology for possible work-up for primaries.  Suspected melanoma but could also be other primaries.  #2 history of malignant melanoma: This has been excised.  May have had metastasis as above.  #3 GERD: Continue with PPIs  #4 essential hypertension: Continue with blood pressure control.   DVT prophylaxis: Lovenox Code Status: Full code Family Communication: Wife at bedside Disposition Plan: To be determined Consults called: Dr. Marin Olp Admission status: Inpatient  Severity of Illness: The appropriate patient status for this patient is INPATIENT. Inpatient status is judged to be reasonable and necessary in order to provide the required intensity of service to ensure the patient's safety. The patient's presenting symptoms, physical exam findings, and initial radiographic and laboratory data in the context of their chronic comorbidities is felt to place them at high risk for further clinical deterioration. Furthermore, it is not anticipated that the patient will be medically stable for discharge from the hospital within 2 midnights of admission. The following factors support the patient status of inpatient.   " The patient's presenting symptoms include left-sided weakness. " The worrisome physical exam findings include weakness on the left. " The initial radiographic and laboratory data  are worrisome because of metastatic disease in the brain and abdomen. " The chronic co-morbidities include history of melanoma.   * I certify that at the point of admission it is my clinical judgment that the patient will require inpatient hospital care spanning beyond 2 midnights from the point of admission due to high intensity of service, high risk for further deterioration and high  frequency of surveillance required.Barbette Merino MD [facility] Pager [phone]  If 7PM-7AM, please contact night-coverage [email or web address] Password [facility]  06/03/2019, 11:05 PM

## 2019-06-03 NOTE — ED Provider Notes (Signed)
[place] EMERGENCY DEPARTMENT Provider Note   CSN: [id number] Arrival date & time: 06/03/19  1109     History Chief Complaint  Patient presents with  . Stroke Symptoms    Walter Hernandez is a 68 y.o. male.  The history is provided by the patient and medical records. No language interpreter was used.  Neurologic Problem This is a new problem. The current episode started more than 2 days ago. The problem occurs constantly. The problem has been gradually worsening. Pertinent negatives include no chest pain, no abdominal pain, no headaches and no shortness of breath. Nothing aggravates the symptoms. Nothing relieves the symptoms. He has tried nothing for the symptoms. The treatment provided no relief.       Past Medical History:  Diagnosis Date  . Cancer [facility])    recently discovered melanoma  . GERD (gastroesophageal reflux disease)   . History of hiatal hernia   . Hypertension    recently placed on new blood pressure med  . Malignant melanoma of skin of left lower leg ([place]) 07/28/2015    Patient Active Problem List   Diagnosis Date Noted  . Malignant melanoma of skin of left lower leg ([place]) 07/28/2015    Past Surgical History:  Procedure Laterality Date  . CARDIAC CATHETERIZATION     15 yrs ago...came out normal   -- hiatal hernia  . MELANOMA EXCISION WITH SENTINEL LYMPH NODE BIOPSY Left 09/29/2014   Procedure: WIDE EXCISION MELANOMA LEFT CALF WITH LEFT INGUINAL  SENTINEL LYMPH NODE BIOPSY;  Surgeon: Georganna Skeans, MD;  Location: [place];  Service: General;  Laterality: Left;  . skin lesion excised     non cancerous       No family history on file.  Social History   Tobacco Use  . Smoking status: Current Every Day Smoker    Packs/day: 1.00    Years: 50.00    Pack years: 50.00    Types: Cigarettes  . Smokeless tobacco: Never Used  Substance Use Topics  . Alcohol use: Not on file    Comment: socially....beer  wine & liquor  . Drug use: No     Home Medications Prior to Admission medications   Medication Sig Start Date End Date Taking? Authorizing Provider  acetaminophen (TYLENOL) 500 MG tablet Take 1,000 mg by mouth every 6 (six) hours as needed for moderate pain.    [provider]  amLODipine (NORVASC) 5 MG tablet Take 5 mg by mouth.    [provider]  Naphazoline-Pheniramine (OPCON-A OP) Place 2 drops into both eyes as needed (for dry eyes).    [provider]  omeprazole (PRILOSEC) 40 MG capsule Take 40 mg by mouth daily.    [provider]  tamsulosin (FLOMAX) 0.4 MG CAPS capsule Take 0.4 mg by mouth daily. 07/17/15   [provider]    Allergies    Patient has no known allergies.  Review of Systems   Review of Systems  Constitutional: Negative for chills, diaphoresis, fatigue and fever.  HENT: Negative for congestion.   Eyes: Negative for visual disturbance.  Respiratory: Negative for cough, chest tightness, shortness of breath and wheezing.   Cardiovascular: Negative for chest pain.  Gastrointestinal: Negative for abdominal pain, constipation, diarrhea, nausea and vomiting.  Genitourinary: Negative for flank pain and frequency.  Musculoskeletal: Positive for gait problem. Negative for back pain, neck pain and neck stiffness.  Skin: Negative for rash and wound.  Neurological: Positive for weakness and numbness. Negative for  tremors, syncope, speech difficulty and headaches.  Psychiatric/Behavioral: Negative for agitation.  All other systems reviewed and are negative.   Physical Exam Updated Vital Signs BP (!) 153/97   Pulse (!) 112   Temp 98.6 F (37 C) (Oral)   Resp 16   SpO2 95%   Physical Exam Vitals and nursing note reviewed.  Constitutional:      General: He is not in acute distress.    Appearance: He is well-developed. He is not diaphoretic.  HENT:     Head: Normocephalic and atraumatic.     Right Ear: External ear normal.     Left Ear: External  ear normal.     Nose: Nose normal.     Mouth/Throat:     Mouth: Mucous membranes are moist.     Pharynx: No oropharyngeal exudate.  Eyes:     Conjunctiva/sclera: Conjunctivae normal.     Pupils: Pupils are equal, round, and reactive to light.  Cardiovascular:     Rate and Rhythm: Normal rate.     Pulses: Normal pulses.     Heart sounds: No murmur.  Pulmonary:     Effort: No respiratory distress.     Breath sounds: No stridor.  Abdominal:     Palpations: Abdomen is soft.     Tenderness: There is no abdominal tenderness. There is no right CVA tenderness, left CVA tenderness, guarding or rebound.  Musculoskeletal:     Cervical back: Normal range of motion and neck supple. No tenderness.  Skin:    General: Skin is warm.     Findings: No erythema or rash.  Neurological:     Mental Status: He is alert and oriented to person, place, and time.     GCS: GCS eye subscore is 4. GCS verbal subscore is 5. GCS motor subscore is 6.     Cranial Nerves: Cranial nerves are intact. No cranial nerve deficit.     Sensory: Sensory deficit present.     Motor: Weakness present. No tremor, abnormal muscle tone or seizure activity.     Coordination: Finger-Nose-Finger Test normal.     Gait: Abnormal gait: L leg weakness, pt felt he coudl nto walk.     Comments: Severe weakness of left arm and left leg.  Arm is worse than leg.  Decreased sensation in left arm and left leg.  No facial involvement.  Pupils are symmetric and reactive with normal extraocular movements.  Clear speech.  Psychiatric:        Mood and Affect: Mood normal.     ED Results / Procedures / Treatments   Labs (all labs ordered are listed, but only abnormal results are displayed) Labs Reviewed  CBC - Abnormal; Notable for the following components:      Result Value   MCV 100.6 (*)    All other components within normal limits  DIFFERENTIAL - Abnormal; Notable for the following components:   Abs Immature Granulocytes 0.10 (*)     All other components within normal limits  COMPREHENSIVE METABOLIC PANEL - Abnormal; Notable for the following components:   Glucose, Bld 101 (*)    BUN 7 (*)    All other components within normal limits  I-STAT CHEM 8, ED - Abnormal; Notable for the following components:   Creatinine, Ser 0.50 (*)    Calcium, Ion 1.11 (*)    All other components within normal limits  CBG MONITORING, ED - Abnormal; Notable for the following components:   Glucose-Capillary 104 (*)  All other components within normal limits  SARS CORONAVIRUS 2 (TAT 6-24 HRS)  PROTIME-INR  APTT    EKG EKG Interpretation  Date/Time:  Monday June 03 2019 11:55:10 EST Ventricular Rate:  106 PR Interval:  190 QRS Duration: 132 QT Interval:  356 QTC Calculation: 472 R Axis:   -93 Text Interpretation: Sinus tachycardia Right bundle branch block Possible Anterolateral infarct , age undetermined Abnormal ECG When compared to prior, faster rate and t wave inversion in lead 3. No STEMI Confirmed by Antony Blackbird [phone]) on 06/03/2019 12:04:00 PM   Radiology CT HEAD WO CONTRAST  Result Date: 06/03/2019 CLINICAL DATA:  Left arm weakness over the last week. EXAM: CT HEAD WITHOUT CONTRAST TECHNIQUE: Contiguous axial images were obtained from the base of the skull through the vertex without intravenous contrast. COMPARISON:  None. FINDINGS: Brain: No abnormality is seen affecting the brainstem or cerebellum. Left cerebral hemisphere shows subcortical white matter edema in the left posterior parietal region and at the left frontal vertex. Right cerebral hemisphere shows a large region of vasogenic edema within the right frontoparietal white matter with intact overlying cortex. Probable cortical thickening. These findings are not typical of ordinary infarctions and raise the possibility of either metastatic disease, multifocal primary brain malignancy or lymphoma. Cerebritis could also be possible. Recommend MRI with and without  contrast when able. No hydrocephalus. No midline shift. No extra-axial fluid collection. Vascular: There is atherosclerotic calcification of the major vessels at the base of the brain. Skull: Negative Sinuses/Orbits: Clear/normal Other: None IMPRESSION: Pronounced abnormality of the right frontoparietal region with extensive vasogenic white matter edema and probable abnormal cortical thickening and hyperdensity. Smaller area of vasogenic edema within the left posterior parietal subcortical white matter and the left frontal subcortical white matter. Most likely diagnosis is occult metastatic disease. Multifocal primary brain malignancy and lymphoma are also possible. Lastly, septic emboli/cerebritis are considered. Recommend brain MRI with and without contrast. Electronically Signed   By: Nelson Chimes M.D.   On: 06/03/2019 13:24    Procedures Procedures (including critical care time)  Medications Ordered in ED Medications  sodium chloride flush (NS) 0.9 % injection 3 mL (has no administration in time range)  LORazepam (ATIVAN) tablet 1-4 mg (has no administration in time range)    Or  LORazepam (ATIVAN) injection 1-4 mg (has no administration in time range)  thiamine tablet 100 mg (has no administration in time range)    Or  thiamine (B-1) injection 100 mg (has no administration in time range)  folic acid (FOLVITE) tablet 1 mg (has no administration in time range)  multivitamin with minerals tablet 1 tablet (has no administration in time range)  LORazepam (ATIVAN) injection 2 mg (2 mg Intravenous Given 06/03/19 1653)  gadobutrol (GADAVIST) 1 MMOL/ML injection 6 mL (6 mLs Intravenous Contrast Given 06/03/19 1738)    ED Course  I have reviewed the triage vital signs and the nursing notes.  Pertinent labs & imaging results that were available during my care of the patient were reviewed by me and considered in my medical decision making (see chart for details).    MDM Rules/Calculators/A&P                       Rondey Fallen is a 68 y.o. male with a past medical history significant for hypertension, prior malignant melanoma, and GERD who presents with left arm and left leg weakness and numbness.  Patient reports that for the last week he has had  worsening symptoms of left arm numbness and weakness.  He reports that over the last day or 2 he has had left leg involvement with numbness and weakness and some difficulty walking.  He denies any headache or neck pain.  Denies any neck stiffness.  Denies fevers, chills, nausea vomiting, vision changes, constipation, or diarrhea.  No recent trauma.  No Covid exposures to his knowledge.  No other complaints.  He reports it has been gradual for the last week worsening.  On exam, patient has weakness in his left arm compared to right.  He has decreased grip strength.  He has decreased strength in his left leg compared to right.  He has decree sensation in left leg and left arm.  Symmetric smile and pupils are symmetric and reactive with normal extraocular movements.  Clear speech.  Lungs clear and chest is nontender.  Abdomen is nontender.  No neck tenderness.  Clinically I am concerned about stroke however as his symptoms have been ongoing for several days, will not call code stroke at this time.  CT head concerning for edema and possible malignancy versus metastasis.  I spoke with neurology initially who recommended MRI with and without contrast to determine if he is someone who will need neurosurgical intervention versus admission for PT/OT and steroids.  MRI with and without contrast was ordered.  Patient initially had claustrophobia but was agreeable to getting IV Ativan for this.  Patient will have MRI and will determine disposition and consults based on imaging results.  Anticipate admission as he is unable to ambulate safely and he has the weakness in his arm and leg.  Anticipate admission.      Prior to MRI, nursing spoke to patient and he is  reporting concern about alcohol withdrawal.  He does say that he typically drinks 1/5 of liquor per night and as he has not started drinking today, he is feeling very anxious.  He will be placed on CIWA protocol and get the vitamins.  Anticipate disposition being determined after MRI is completed.   Final Clinical Impression(s) / ED Diagnoses Final diagnoses:  Left arm weakness  Left arm numbness  Left leg weakness  Left leg numbness    Clinical Impression: 1. Left arm weakness   2. Left arm numbness   3. Left leg weakness   4. Left leg numbness     Disposition: Care transferred to Dr. Laverta Baltimore while awaiting results of MRI with and without.  Anticipate touching base with our neurology or neurosurgery based on the results and he will need admission.  This note was prepared with assistance of Systems analyst. Occasional wrong-word or sound-a-like substitutions may have occurred due to the inherent limitations of voice recognition software.      Kristi Hyer, Gwenyth Allegra, MD 06/03/19 [phone]

## 2019-06-03 NOTE — ED Notes (Signed)
Gave pt sandwich bag and gingerale

## 2019-06-03 NOTE — ED Provider Notes (Addendum)
Blood pressure 137/87, pulse 99, temperature 98 F (36.7 C), temperature source Oral, resp. rate (!) 24, SpO2 98 %.  Assuming care from Dr. Sherry Ruffing.  In short, Walter Hernandez is a 68 y.o. male with a chief complaint of Stroke Symptoms .  Refer to the original H&P for additional details.  The current plan of care is to f/u on MRI brain. CVA vs metastatic disease. Left side deficit on exam. Will require admit ultimately.   06:27 PM  MRI reviewed with multiple metastatic lesions with surrounding edema.  Will page oncology to discuss.  Spoke with Dr. Marin Olp reviewed the case.  They will consult here at [facility] in the morning.  I have ordered CT scan of the chest, abdomen, pelvis.  He recommends 40 mg of IV Decadron now followed by 20 mg daily which was ordered.  I updated the patient and made him aware of his MRI findings.  Patient's wife was on speaker phone during the discussion.  Discussed patient's case with [name], Dr. Jonelle Sidle to request admission. Patient and family (if present) updated with plan. Care transferred to [facility] service.  I reviewed all nursing notes, vitals, pertinent old records, EKGs, labs, imaging (as available).  CRITICAL CARE Performed by: Margette Fast Total critical care time: 35 minutes Critical care time was exclusive of separately billable procedures and treating other patients. Critical care was necessary to treat or prevent imminent or life-threatening deterioration. Critical care was time spent personally by me on the following activities: development of treatment plan with patient and/or surrogate as well as nursing, discussions with consultants, evaluation of patient's response to treatment, examination of patient, obtaining history from patient or surrogate, ordering and performing treatments and interventions, ordering and review of laboratory studies, ordering and review of radiographic studies, pulse oximetry and re-evaluation of patient's condition.  Nanda Quinton,  MD Emergency Medicine    Mohamud Mrozek, Wonda Olds, MD 06/03/19 [place]    Margette Fast, MD 06/04/19 2034

## 2019-06-03 NOTE — ED Notes (Signed)
Admitting MD at [facility]

## 2019-06-03 NOTE — ED Notes (Signed)
Returned from MRI 

## 2019-06-03 NOTE — ED Notes (Signed)
Walter Hernandez  [phone]

## 2019-06-03 NOTE — ED Notes (Signed)
Pt admits to drinking "a lot-- 1/2 of a fifth of liquor a night-- starting around 7p" admits to getting jittery and shaky if he doesn't drink. Will get CIWA ordered

## 2019-06-03 NOTE — ED Notes (Addendum)
ED TO INPATIENT HANDOFF REPORT  ED Nurse Name and Phone #: [phone] [facility]   S Name/Age/Gender Walter Hernandez 68 y.o. male Room/Bed: 052C/052C  Code Status   Code Status: Not on file  Home/SNF/Other Home Patient oriented to: self, place, time and situation Is this baseline? Yes   Triage Complete: Triage complete  Chief Complaint Brain metastases [facility]) [C79.31]  Triage Note Has had left arm weakness for 1 week-- seen at urgent care- referred to neurologist-- weakness has gotten worse - now unable to get out of chair, started today--  Unable to grip with left hand, unable to raise arm, states feels numb and heavy-- slight facial droop on left side.     Allergies No Known Allergies  Level of Care/Admitting Diagnosis ED Disposition    ED Disposition Condition [facility] Area: [place] [[id number]]  Level of Care: Med-Surg [16]  Covid Evaluation: Asymptomatic Screening Protocol (No Symptoms)  Diagnosis: Brain metastases [facility]) [[id number]]  Admitting Physician: Elwyn Reach [[id number]]  Attending Physician: Elwyn Reach [[id number]]  Estimated length of stay: past midnight tomorrow  Certification:: I certify this patient will need inpatient services for at least 2 midnights       B Medical/Surgery History Past Medical History:  Diagnosis Date  . Cancer [facility])    recently discovered melanoma  . GERD (gastroesophageal reflux disease)   . History of hiatal hernia   . Hypertension    recently placed on new blood pressure med  . Malignant melanoma of skin of left lower leg ([place]) 07/28/2015   Past Surgical History:  Procedure Laterality Date  . CARDIAC CATHETERIZATION     15 yrs ago...came out normal   -- hiatal hernia  . MELANOMA EXCISION WITH SENTINEL LYMPH NODE BIOPSY Left 09/29/2014   Procedure: WIDE EXCISION MELANOMA LEFT CALF WITH LEFT INGUINAL  SENTINEL LYMPH NODE BIOPSY;  Surgeon: Georganna Skeans, MD;  Location: [place];  Service:  General;  Laterality: Left;  . skin lesion excised     non cancerous     A IV Location/Drains/Wounds Patient Lines/Drains/Airways Status   Active Line/Drains/Airways    Name:   Placement date:   Placement time:   Site:   Days:   Peripheral IV 06/03/19 Left Antecubital   06/03/19    1652    Antecubital   less than 1   Peripheral IV 06/03/19 Left Forearm   06/03/19    2128    Forearm   less than 1   External Urinary Catheter   06/03/19    1809    -   less than 1   Incision (Closed) 09/29/14 Groin Left   09/29/14    0931     1708   Incision (Closed) 09/29/14 Leg Left   09/29/14    0951     1708          Intake/Output Last 24 hours No intake or output data in the 24 hours ending 06/03/19 2233  Labs/Imaging Results for orders placed or performed during the hospital encounter of 06/03/19 (from the past 48 hour(s))  Protime-INR     Status: None   Collection Time: 06/03/19 12:27 PM  Result Value Ref Range   Prothrombin Time 12.4 11.4 - 15.2 seconds   INR 0.9 0.8 - 1.2    Comment: (NOTE) INR goal varies based on device and disease states. Performed at [facility], [place] [address], [place], [place] [address]   APTT  Status: None   Collection Time: 06/03/19 12:27 PM  Result Value Ref Range   aPTT 33 24 - 36 seconds    Comment: Performed at [facility], [place] [address], [place], [place] [address]  CBC     Status: Abnormal   Collection Time: 06/03/19 12:27 PM  Result Value Ref Range   WBC 9.2 4.0 - 10.5 K/uL   RBC 4.73 4.22 - 5.81 MIL/uL   Hemoglobin 16.1 13.0 - 17.0 g/dL   HCT 47.6 39.0 - 52.0 %   MCV 100.6 (H) 80.0 - 100.0 fL   MCH 34.0 26.0 - 34.0 pg   MCHC 33.8 30.0 - 36.0 g/dL   RDW 11.5 11.5 - 15.5 %   Platelets 255 150 - 400 K/uL   nRBC 0.0 0.0 - 0.2 %    Comment: Performed at [facility], [place] [address], [place], [place] [address]  Differential     Status: Abnormal   Collection Time: 06/03/19 12:27 PM  Result Value Ref Range   Neutrophils  Relative % 70 %   Neutro Abs 6.6 1.7 - 7.7 K/uL   Lymphocytes Relative 17 %   Lymphs Abs 1.5 0.7 - 4.0 K/uL   Monocytes Relative 10 %   Monocytes Absolute 0.9 0.1 - 1.0 K/uL   Eosinophils Relative 1 %   Eosinophils Absolute 0.1 0.0 - 0.5 K/uL   Basophils Relative 1 %   Basophils Absolute 0.1 0.0 - 0.1 K/uL   Immature Granulocytes 1 %   Abs Immature Granulocytes 0.10 (H) 0.00 - 0.07 K/uL    Comment: Performed at [place] [address], [place], [place] [address]  Comprehensive metabolic panel     Status: Abnormal   Collection Time: 06/03/19 12:27 PM  Result Value Ref Range   Sodium 136 135 - 145 mmol/L   Potassium 4.1 3.5 - 5.1 mmol/L   Chloride 98 98 - 111 mmol/L   CO2 24 22 - 32 mmol/L   Glucose, Bld 101 (H) 70 - 99 mg/dL   BUN 7 (L) 8 - 23 mg/dL   Creatinine, Ser 1.00 0.61 - 1.24 mg/dL   Calcium 9.7 8.9 - 10.3 mg/dL   Total Protein 7.1 6.5 - 8.1 g/dL   Albumin 3.9 3.5 - 5.0 g/dL   AST 28 15 - 41 U/L   ALT 12 0 - 44 U/L   Alkaline Phosphatase 62 38 - 126 U/L   Total Bilirubin 1.0 0.3 - 1.2 mg/dL   GFR calc non Af Amer >60 >60 mL/min   GFR calc Af Amer >60 >60 mL/min   Anion gap 14 5 - 15    Comment: Performed at [place] [address], [place], [place] [address]  I-stat chem 8, ED     Status: Abnormal   Collection Time: 06/03/19 12:33 PM  Result Value Ref Range   Sodium 135 135 - 145 mmol/L   Potassium 3.8 3.5 - 5.1 mmol/L   Chloride 99 98 - 111 mmol/L   BUN 8 8 - 23 mg/dL   Creatinine, Ser 0.50 (L) 0.61 - 1.24 mg/dL   Glucose, Bld 99 70 - 99 mg/dL   Calcium, Ion 1.11 (L) 1.15 - 1.40 mmol/L   TCO2 25 22 - 32 mmol/L   Hemoglobin 16.7 13.0 - 17.0 g/dL   HCT 49.0 39.0 - 52.0 %  CBG monitoring, ED     Status: Abnormal   Collection Time: 06/03/19  2:53 PM  Result Value Ref Range   Glucose-Capillary 104 (H) 70 - 99 mg/dL   Comment 1 Notify RN    Comment 2 Document in Chart    CT HEAD WO CONTRAST  Result Date: 06/03/2019 CLINICAL DATA:  Left  arm weakness over the last week. EXAM: CT HEAD WITHOUT CONTRAST TECHNIQUE: Contiguous axial images were obtained from the base of the skull through the vertex without intravenous contrast. COMPARISON:  None. FINDINGS: Brain: No abnormality is seen affecting the brainstem or cerebellum. Left cerebral hemisphere shows subcortical white matter edema in the left posterior parietal region and at the left frontal vertex. Right cerebral hemisphere shows a large region of vasogenic edema within the right frontoparietal white matter with intact overlying cortex. Probable cortical thickening. These findings are not typical of ordinary infarctions and raise the possibility of either metastatic disease, multifocal primary brain malignancy or lymphoma. Cerebritis could also be possible. Recommend MRI with and without contrast when able. No hydrocephalus. No midline shift. No extra-axial fluid collection. Vascular: There is atherosclerotic calcification of the major vessels at the base of the brain. Skull: Negative Sinuses/Orbits: Clear/normal Other: None IMPRESSION: Pronounced abnormality of the right frontoparietal region with extensive vasogenic white matter edema and probable abnormal cortical thickening and hyperdensity. Smaller area of vasogenic edema within the left posterior parietal subcortical white matter and the left frontal subcortical white matter. Most likely diagnosis is occult metastatic disease. Multifocal primary brain malignancy and lymphoma are also possible. Lastly, septic emboli/cerebritis are considered. Recommend brain MRI with and without contrast. Electronically Signed   By: Nelson Chimes M.D.   On: 06/03/2019 13:24   CT Chest W Contrast  Result Date: 06/03/2019 CLINICAL DATA:  Brain MR demonstrating metastatic disease. History of melanoma 4 years ago. EXAM: CT CHEST, ABDOMEN, AND PELVIS WITH CONTRAST TECHNIQUE: Multidetector CT imaging of the chest, abdomen and pelvis was performed following the  standard protocol during bolus administration of intravenous contrast. CONTRAST:  135mL OMNIPAQUE IOHEXOL 300 MG/ML  SOLN COMPARISON:  Chest radiograph report 04/29/99 brain MR of earlier today. FINDINGS: CT CHEST FINDINGS Cardiovascular: Mild motion degradation involving the upper and mid chest. Aortic and branch vessel atherosclerosis. Tortuous thoracic aorta. Normal heart size, without pericardial effusion. No central pulmonary embolism, on this non-dedicated study. Mediastinum/Nodes: Extensive mediastinal adenopathy. Right paratracheal node measures 1.6 cm on 18/3. Subcarinal node measures 1.5 cm on 29/3. Right hilar adenopathy at 1.6 cm on 30/3. Prevascular node of 1.0 cm on 22/3. Lungs/Pleura: No pleural fluid. Bronchial wall thickening is most significant in the right middle and right lower lobes, including on 82/5. Centrilobular emphysema. Right middle lobe 5 mm pulmonary nodule on 96/5. Right lower lobe 1.0 cm nodule including on 84/5. Likely calcified 2 mm posterior right upper lobe pulmonary nodule on 63/5. Musculoskeletal: No acute osseous abnormality. CT ABDOMEN PELVIS FINDINGS Hepatobiliary: Vague hypoattenuating liver lesions bilaterally. Example in the right hepatic lobe at 8 mm on 54/3 and within the lateral segment left liver lobe 1.1 cm on 57/3. Normal gallbladder, without biliary ductal dilatation. Pancreas: Normal, without mass or ductal dilatation. Spleen: Normal in size, without focal abnormality. Adrenals/Urinary Tract: Normal adrenal glands. Normal left kidney. Too small to characterize interpolar right renal lesion. No hydronephrosis. Bladder wall irregularity with small saccules posteriorly. Stomach/Bowel: Proximal gastric underdistention. Scattered colonic diverticula. Normal terminal ileum. Normal small bowel. Vascular/Lymphatic: Advanced aortic and branch vessel atherosclerosis. Porta hepatis adenopathy, including a nodal mass of 2.6 x 3.1 cm on 66/3. No pelvic sidewall adenopathy.  Reproductive: Mild prostatomegaly.  Other: No significant free fluid. No evidence of omental or peritoneal disease. Musculoskeletal: Thoracolumbar spondylosis. S-shaped thoracolumbar spine curvature. IMPRESSION: 1. Thoracoabdominal adenopathy and liver lesions, favored to represent metastatic disease. Given emphysema and small pulmonary nodules, differential considerations include metastatic bronchogenic carcinoma and melanoma. 2. Motion degraded evaluation of the upper chest. 3. Aortic atherosclerosis (ICD10-I70.0) and emphysema (ICD10-J43.9). 4. Prostatomegaly with a component of bladder outlet obstruction. Electronically Signed   By: Abigail Miyamoto M.D.   On: 06/03/2019 20:02   MR Brain W and Wo Contrast  Result Date: 06/03/2019 CLINICAL DATA:  Left arm weakness.  Abnormal CT head EXAM: MRI HEAD WITHOUT AND WITH CONTRAST TECHNIQUE: Multiplanar, multiecho pulse sequences of the brain and surrounding structures were obtained without and with intravenous contrast. CONTRAST:  31mL GADAVIST GADOBUTROL 1 MMOL/ML IV SOLN COMPARISON:  CT head 06/03/2019 FINDINGS: Brain: Multiple enhancing mass lesions are present in the brain. Largest in the right posterior frontal lobe shows homogeneous enhancement measuring 35 x 27 mm. The mass appears to infiltrate and thicken the cortex with extensive adjacent vasogenic edema. This corresponds to the primary CT abnormality Additional smaller enhancing mass lesions are present. 6 mm enhancing lesion right frontal lobe. 11 mm enhancing lesion left frontal lobe with mild edema. 9 mm enhancing lesion left frontal lobe. 3 mm enhancing lesion left temporoparietal periventricular white matter. 12 mm enhancing lesion left posterior parietal lobe with edema. These lesions show significant restricted diffusion. No associated hemorrhage Ventricle size normal.  No midline shift. Vascular: Normal arterial flow voids. Skull and upper cervical spine: No focal skeletal lesion. Sinuses/Orbits: Mild  mucosal edema paranasal sinuses.  Normal orbit Other: None IMPRESSION: Multiple enhancing mass lesions in the brain with surrounding edema compatible with metastatic disease. Patient has history of melanoma. Electronically Signed   By: Franchot Gallo M.D.   On: 06/03/2019 18:13   CT ABDOMEN PELVIS W CONTRAST  Result Date: 06/03/2019 CLINICAL DATA:  Brain MR demonstrating metastatic disease. History of melanoma 4 years ago. EXAM: CT CHEST, ABDOMEN, AND PELVIS WITH CONTRAST TECHNIQUE: Multidetector CT imaging of the chest, abdomen and pelvis was performed following the standard protocol during bolus administration of intravenous contrast. CONTRAST:  181mL OMNIPAQUE IOHEXOL 300 MG/ML  SOLN COMPARISON:  Chest radiograph report 04/29/99 brain MR of earlier today. FINDINGS: CT CHEST FINDINGS Cardiovascular: Mild motion degradation involving the upper and mid chest. Aortic and branch vessel atherosclerosis. Tortuous thoracic aorta. Normal heart size, without pericardial effusion. No central pulmonary embolism, on this non-dedicated study. Mediastinum/Nodes: Extensive mediastinal adenopathy. Right paratracheal node measures 1.6 cm on 18/3. Subcarinal node measures 1.5 cm on 29/3. Right hilar adenopathy at 1.6 cm on 30/3. Prevascular node of 1.0 cm on 22/3. Lungs/Pleura: No pleural fluid. Bronchial wall thickening is most significant in the right middle and right lower lobes, including on 82/5. Centrilobular emphysema. Right middle lobe 5 mm pulmonary nodule on 96/5. Right lower lobe 1.0 cm nodule including on 84/5. Likely calcified 2 mm posterior right upper lobe pulmonary nodule on 63/5. Musculoskeletal: No acute osseous abnormality. CT ABDOMEN PELVIS FINDINGS Hepatobiliary: Vague hypoattenuating liver lesions bilaterally. Example in the right hepatic lobe at 8 mm on 54/3 and within the lateral segment left liver lobe 1.1 cm on 57/3. Normal gallbladder, without biliary ductal dilatation. Pancreas: Normal, without mass  or ductal dilatation. Spleen: Normal in size, without focal abnormality. Adrenals/Urinary Tract: Normal adrenal glands. Normal left kidney. Too small to characterize interpolar right renal lesion. No hydronephrosis. Bladder wall irregularity with small saccules posteriorly. Stomach/Bowel:  Proximal gastric underdistention. Scattered colonic diverticula. Normal terminal ileum. Normal small bowel. Vascular/Lymphatic: Advanced aortic and branch vessel atherosclerosis. Porta hepatis adenopathy, including a nodal mass of 2.6 x 3.1 cm on 66/3. No pelvic sidewall adenopathy. Reproductive: Mild prostatomegaly. Other: No significant free fluid. No evidence of omental or peritoneal disease. Musculoskeletal: Thoracolumbar spondylosis. S-shaped thoracolumbar spine curvature. IMPRESSION: 1. Thoracoabdominal adenopathy and liver lesions, favored to represent metastatic disease. Given emphysema and small pulmonary nodules, differential considerations include metastatic bronchogenic carcinoma and melanoma. 2. Motion degraded evaluation of the upper chest. 3. Aortic atherosclerosis (ICD10-I70.0) and emphysema (ICD10-J43.9). 4. Prostatomegaly with a component of bladder outlet obstruction. Electronically Signed   By: Abigail Miyamoto M.D.   On: 06/03/2019 20:02    Pending Labs Unresulted Labs (From admission, onward)    Start     Ordered   06/03/19 1603  SARS CORONAVIRUS 2 (TAT 6-24 HRS) Nasopharyngeal Nasopharyngeal Swab  (Tier 3 (TAT 6-24 hrs))  Once,   STAT    Question Answer Comment  Is this test for diagnosis or screening Screening   Symptomatic for COVID-19 as defined by CDC No   Hospitalized for COVID-19 No   Admitted to ICU for COVID-19 No   Previously tested for COVID-19 No   Resident in a congregate (group) care setting Unknown   Employed in healthcare setting Unknown      06/03/19 1603   Signed and Held  HIV Antibody (routine testing w rflx)  (HIV Antibody (Routine testing w reflex) panel)  Once,   R      Signed and Held   Signed and Held  CBC  (enoxaparin (LOVENOX)    CrCl >/= 30 ml/min)  Once,   R    Comments: Baseline for enoxaparin therapy IF NOT ALREADY DRAWN.  Notify MD if PLT < 100 K.    Signed and Held   Signed and Held  Creatinine, serum  (enoxaparin (LOVENOX)    CrCl >/= 30 ml/min)  Once,   R    Comments: Baseline for enoxaparin therapy IF NOT ALREADY DRAWN.    Signed and Held   Signed and Held  Creatinine, serum  (enoxaparin (LOVENOX)    CrCl >/= 30 ml/min)  Weekly,   R    Comments: while on enoxaparin therapy    Signed and Held   Signed and Held  Comprehensive metabolic panel  Tomorrow morning,   R     Signed and Held   Signed and Held  CBC  Tomorrow morning,   R     Signed and Held          Vitals/Pain Today's Vitals   06/03/19 1833 06/03/19 1900 06/03/19 2144 06/03/19 2229  BP: 112/64 123/76  (!) 153/72  Pulse: 87   86  Resp: 18   18  Temp:    98.8 F (37.1 C)  TempSrc:    Oral  SpO2: 94%   95%  PainSc:   0-No pain     Isolation Precautions No active isolations  Medications Medications  sodium chloride flush (NS) 0.9 % injection 3 mL (has no administration in time range)  LORazepam (ATIVAN) tablet 1-4 mg (has no administration in time range)    Or  LORazepam (ATIVAN) injection 1-4 mg (has no administration in time range)  thiamine tablet 100 mg (100 mg Oral Given 06/03/19 2119)    Or  thiamine (B-1) injection 100 mg ( Intravenous See Alternative 05/39/76 7341)  folic acid (FOLVITE) tablet 1 mg (1 mg Oral Given  06/03/19 2119)  multivitamin with minerals tablet 1 tablet (1 tablet Oral Given 06/03/19 2118)  dexamethasone (DECADRON) injection 20 mg (has no administration in time range)  LORazepam (ATIVAN) injection 2 mg (2 mg Intravenous Given 06/03/19 1653)  gadobutrol (GADAVIST) 1 MMOL/ML injection 6 mL (6 mLs Intravenous Contrast Given 06/03/19 1738)  dexamethasone (DECADRON) injection 40 mg (40 mg Intravenous Given 06/03/19 2119)  iohexol (OMNIPAQUE)  300 MG/ML solution 100 mL (100 mLs Intravenous Contrast Given 06/03/19 1933)    Mobility walks with person assist High fall risk   Focused Assessments Neuro Assessment Handoff:  Swallow screen pass? N/A   NIH Stroke Scale ( + Modified Stroke Scale Criteria)  Interval: Initial Level of Consciousness (1a.)   : Alert, keenly responsive LOC Questions (1b. )   +: Answers both questions correctly LOC Commands (1c. )   + : Performs both tasks correctly Best Gaze (2. )  +: Normal Visual (3. )  +: No visual loss Facial Palsy (4. )    : Normal symmetrical movements Motor Arm, Left (5a. )   +: No effort against gravity Motor Arm, Right (5b. )   +: No drift Motor Leg, Left (6a. )   +: Drift Motor Leg, Right (6b. )   +: No drift Limb Ataxia (7. ): Absent Sensory (8. )   +: Normal, no sensory loss Best Language (9. )   +: No aphasia Dysarthria (10. ): Normal Extinction/Inattention (11.)   +: No Abnormality Modified SS Total  +: 4 Complete NIHSS TOTAL: 4     Neuro Assessment: Exceptions to WDL Neuro Checks:   Initial (06/03/19 1224)  Last Documented NIHSS Modified Score: 4 (06/03/19 1510) Has TPA been given? No If patient is a Neuro Trauma and patient is going to OR before floor call report to [place] nurse: [phone] or [phone]     R Recommendations: See Admitting Provider Note  Report given to:   Additional Notes:  Patient from home with weakness on left side; pt unable to use left arm for over week; Pt has hx of Skin CA and scans show massive Brain lesions as well as Liver lesions; Pt and family has been undated and Admitted has seen patient in ED; Patient is A&Ox 4 but unstable on feet; Condom cath has been placed for urination;Patient is a heavy drinker and has PRN ativan; No withdrawal sx at this time; Covid test is still pending-Monique,RN

## 2019-06-03 NOTE — ED Notes (Signed)
Pt transported to CT scan for chest CT

## 2019-06-03 NOTE — ED Triage Notes (Signed)
Has had left arm weakness for 1 week-- seen at urgent care- referred to neurologist-- weakness has gotten worse - now unable to get out of chair, started today--  Unable to grip with left hand, unable to raise arm, states feels numb and heavy-- slight facial droop on left side.

## 2019-06-04 ENCOUNTER — Inpatient Hospital Stay
Admission: RE | Admit: 2019-06-04 | Discharge: 2019-06-04 | Disposition: A | Discharge status: Home / Self Care | Payer: Medicare Other | Source: Ambulatory Visit | Attending: Radiation Oncology | Admitting: Radiation Oncology

## 2019-06-04 DIAGNOSIS — C7931 Secondary malignant neoplasm of brain: Secondary | ICD-10-CM

## 2019-06-04 LAB — COMPREHENSIVE METABOLIC PANEL
ALT: 13 U/L (ref 0–44)
ALT: 14 U/L (ref 0–44)
AST: 24 U/L (ref 15–41)
AST: 24 U/L (ref 15–41)
Albumin: 3.7 g/dL (ref 3.5–5.0)
Albumin: 3.4 g/dL — ABNORMAL LOW (ref 3.5–5.0)
Alkaline Phosphatase: 72 U/L (ref 38–126)
Alkaline Phosphatase: 67 U/L (ref 38–126)
Anion gap: 11 (ref 5–15)
Anion gap: 12 (ref 5–15)
BUN: 7 mg/dL — ABNORMAL LOW (ref 8–23)
BUN: 10 mg/dL (ref 8–23)
CO2: 25 mmol/L (ref 22–32)
CO2: 21 mmol/L — ABNORMAL LOW (ref 22–32)
Calcium: 9.6 mg/dL (ref 8.9–10.3)
Calcium: 9.2 mg/dL (ref 8.9–10.3)
Chloride: 102 mmol/L (ref 98–111)
Chloride: 100 mmol/L (ref 98–111)
Creatinine, Ser: 0.67 mg/dL (ref 0.61–1.24)
Creatinine, Ser: 0.76 mg/dL (ref 0.61–1.24)
GFR calc Af Amer: >60 mL/min (ref >60)
GFR calc Af Amer: >60 mL/min (ref >60)
GFR calc non Af Amer: >60 mL/min (ref >60)
GFR calc non Af Amer: >60 mL/min (ref >60)
Glucose, Bld: 140 mg/dL — ABNORMAL HIGH (ref 70–99)
Glucose, Bld: 246 mg/dL — ABNORMAL HIGH (ref 70–99)
Potassium: 4.2 mmol/L (ref 3.5–5.1)
Potassium: 4 mmol/L (ref 3.5–5.1)
Sodium: 138 mmol/L (ref 135–145)
Sodium: 133 mmol/L — ABNORMAL LOW (ref 135–145)
Total Bilirubin: 0.8 mg/dL (ref 0.3–1.2)
Total Bilirubin: 0.8 mg/dL (ref 0.3–1.2)
Total Protein: 6.9 g/dL (ref 6.5–8.1)
Total Protein: 6.5 g/dL (ref 6.5–8.1)

## 2019-06-04 LAB — CBC
HCT: 47.5 % (ref 39.0–52.0)
HCT: 44.7 % (ref 39.0–52.0)
Hemoglobin: 16.5 g/dL (ref 13.0–17.0)
Hemoglobin: 15.7 g/dL (ref 13.0–17.0)
MCH: 34.1 pg — ABNORMAL HIGH (ref 26.0–34.0)
MCH: 34.4 pg — ABNORMAL HIGH (ref 26.0–34.0)
MCHC: 34.7 g/dL (ref 30.0–36.0)
MCHC: 35.1 g/dL (ref 30.0–36.0)
MCV: 98.1 fL (ref 80.0–100.0)
MCV: 97.8 fL (ref 80.0–100.0)
Platelets: 266 10*3/uL (ref 150–400)
Platelets: 287 10*3/uL (ref 150–400)
RBC: 4.84 MIL/uL (ref 4.22–5.81)
RBC: 4.57 MIL/uL (ref 4.22–5.81)
RDW: 11.4 % — ABNORMAL LOW (ref 11.5–15.5)
RDW: 11.4 % — ABNORMAL LOW (ref 11.5–15.5)
WBC: 8.6 10*3/uL (ref 4.0–10.5)
WBC: 10.8 10*3/uL — ABNORMAL HIGH (ref 4.0–10.5)
nRBC: 0 % (ref 0.0–0.2)
nRBC: 0 % (ref 0.0–0.2)

## 2019-06-04 LAB — PROTIME-INR
INR: 1 (ref 0.8–1.2)
Prothrombin Time: 13.3 seconds (ref 11.4–15.2)

## 2019-06-04 LAB — ABO/RH: ABO/RH(D): O POS

## 2019-06-04 LAB — TYPE AND SCREEN
ABO/RH(D): O POS
Antibody Screen: NEGATIVE

## 2019-06-04 LAB — HIV ANTIBODY (ROUTINE TESTING W REFLEX): HIV Screen 4th Generation wRfx: NONREACTIVE

## 2019-06-04 LAB — APTT: aPTT: 29 seconds (ref 24–36)

## 2019-06-04 MED ORDER — TAMSULOSIN HCL 0.4 MG PO CAPS
0.4000 mg | ORAL_CAPSULE | Freq: Every day | ORAL | Status: DC
  Administered 2019-06-04: 0.4 mg via ORAL
  Filled 2019-06-04: qty 1

## 2019-06-04 MED ORDER — AMLODIPINE BESYLATE 5 MG PO TABS
5.0000 mg | ORAL_TABLET | Freq: Every day | ORAL | Status: DC
  Administered 2019-06-04: 5 mg via ORAL
  Filled 2019-06-04: qty 1

## 2019-06-04 MED ORDER — PANTOPRAZOLE SODIUM 40 MG PO TBEC
40.0000 mg | DELAYED_RELEASE_TABLET | Freq: Every day | ORAL | Status: DC
  Administered 2019-06-04: 40 mg via ORAL
  Filled 2019-06-04: qty 1

## 2019-06-04 MED ORDER — NAPHAZOLINE-PHENIRAMINE 0.025-0.3 % OP SOLN: OPHTHALMIC | Status: DC | PRN

## 2019-06-04 NOTE — TOC Initial Note (Signed)
Transition of Care [facility]) - Initial/Assessment Note    Patient Details  Name: Walter Hernandez MRN: [id number] Date of Birth: [birth year]  Transition of Care [facility]) CM/SW Contact:    Alexander Mt, [place] Phone Number: 06/04/2019, 12:14 PM  Clinical Narrative:                 CSW spoke with pt at bedside. Introduced self, role, reason for visit. Pt from home with his spouse, goes by "[name]"- pt and spouse have a walker and bedside commode at home from pt wife's hip surgery. Pt usually independent. Denies any issues with transportation or medication access. His wife is very supportive- they have a daughter and two grandchildren. Pt and pt wife aware that therapy is to come and see pt, they remark on his weakness in one of his arms and that hopefully therapy can work on that- since equipment is his wife's and older they prefer for any DME recommended also to be ordered.   CSW and TOC team continue to follow for assistance and continued support as needed for disposition.   Expected Discharge Plan: [place] Barriers to Discharge: Continued Medical Work up   Patient Goals and CMS Choice Patient states their goals for this hospitalization and ongoing recovery are:: to go home when ready/have therapy CMS Medicare.gov Compare Post Acute Care list provided to:: (n/a at this time') Choice offered to / list presented to : NA  Expected Discharge Plan and Services Expected Discharge Plan: [place] In-house Referral: Clinical Social Work Discharge Planning Services: CM Consult Post Acute Care Choice: NA Living arrangements for the past 2 months: Single Family Home  Prior Living Arrangements/Services Living arrangements for the past 2 months: Single Family Home Lives with:: Spouse Patient language and need for interpreter reviewed:: Yes(no needs) Do you feel safe going back to the place where you live?: Yes      Need for Family Participation in Patient Care: Yes  (Comment)(assistance and supervision as needed) Care giver support system in place?: Yes (comment)(spouse/other family as needed) Current home services: DME Criminal Activity/Legal Involvement Pertinent to Current Situation/Hospitalization: No - Comment as needed   Permission Sought/Granted Permission sought to share information with : Family Supports Permission granted to share information with : Yes, Verbal Permission Granted  Share Information with NAME: Kollen Armenti     Permission granted to share info w Relationship: spouse  Permission granted to share info w Contact Information: [phone]  Emotional Assessment Appearance:: Appears stated age Attitude/Demeanor/Rapport: Engaged, Gracious Affect (typically observed): Accepting, Adaptable, Appropriate, Pleasant Orientation: : Oriented to Self, Oriented to Place, Oriented to  Time, Oriented to Situation Alcohol / Substance Use: Alcohol Use Psych Involvement: No (comment)  Admission diagnosis:  Brain metastases (HCC) [C79.31] Left arm numbness [R20.0] Left leg numbness [R20.0] Left leg weakness [R29.898] Left arm weakness [R29.898] Patient Active Problem List   Diagnosis Date Noted  . Brain metastases ([place]) 06/03/2019  . Essential hypertension 06/03/2019  . GERD (gastroesophageal reflux disease) 06/03/2019  . Malignant melanoma of skin of left lower leg (HCC) 07/28/2015   PCP:  Lavone Orn, MD Pharmacy:   [facility] [place], [place] [address] Phone: [phone] Fax: [phone]    Readmission Risk Interventions Readmission Risk Prevention Plan 06/04/2019  Post Dischage Appt Not Complete  Appt Comments pending stability/disposition  Medication Screening Complete  Transportation Screening Complete  Some recent data might be hidden

## 2019-06-04 NOTE — Plan of Care (Signed)
  Problem: Education: Goal: Knowledge of General Education information will improve Description: Including pain rating scale, medication(s)/side effects and non-pharmacologic comfort measures Outcome: Progressing   Problem: Health Behavior/Discharge Planning: Goal: Ability to manage health-related needs will improve Outcome: Progressing   Problem: Clinical Measurements: Goal: Ability to maintain clinical measurements within normal limits will improve Outcome: Progressing   Problem: Elimination: Goal: Will not experience complications related to urinary retention Outcome: Progressing   Problem: Pain Managment: Goal: General experience of comfort will improve Outcome: Progressing   Problem: Safety: Goal: Ability to remain free from injury will improve Outcome: Progressing   Problem: Skin Integrity: Goal: Risk for impaired skin integrity will decrease Outcome: Progressing   

## 2019-06-04 NOTE — Consult Note (Signed)
Referral MD  Reason for Referral: Left arm weakness secondary to multiple brain metastasis; history of stage Ib melanoma of the left leg  Chief Complaint  Patient presents with  . Stroke Symptoms  : My left arm is beginning week.  HPI: Walter Hernandez is a very nice 68 year old white male.  He is actually well known to me.  I initially saw him back in 2016.  He has a stage Ib melanoma removed from the left lower leg.  He was last seen back in August.  At that time, it looks like he tore meniscus in his right knee.  He has been working.  He actually goes down to [place], [place] for a lot of work.  Over the past few weeks, he has noted that he has had progressive weakness of the left arm.  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  Things got worse.  He got more more weak on the left arm.  He says his left leg was a little bit weak.  He ultimately ended up in emergency room on 06/03/2019.  He had blood work done.  His electrolytes all look pretty good.  His calcium was 9.6.  His liver function studies were okay.  His white cell count is 8.6.  Hemoglobin 16.5.  Platelet count 266,000.  Had a CT of the brain.  This, unfortunately, showed right frontoparietal edema.  There is also some edema in the left posterior parietal area.  An MRI of the brain last night.  This showed a 35 x 27 mm mass in the right posterior frontal lobe.  Appear to be infiltrated.  Also noted was a 6 mm right frontal lobe lesion.  An 11 mm left frontal lobe lesion.  He had multiple other lesions in the cerebrum.  He then underwent a CT scan of the body.  Looks like he has mediastinal and hilar adenopathy.  He has bilateral lung nodules.  He has some small liver metastasis.  He was started on some Decadron.  He has not had any problem swallowing.  There is no double vision.  He has had no speech difficulties.  There is no problems  with bowels or bladder.  Unfortunately, he is still smoking.  Currently, I would say his performance status is ECOG 1.    Past Medical History:  Diagnosis Date  . Cancer [facility])    recently discovered melanoma  . GERD (gastroesophageal reflux disease)   . History of hiatal hernia   . Hypertension    recently placed on new blood pressure med  . Malignant melanoma of skin of left lower leg ([place]) 07/28/2015  :  Past Surgical History:  Procedure Laterality Date  . CARDIAC CATHETERIZATION     15 yrs ago...came out normal   -- hiatal hernia  . MELANOMA EXCISION WITH SENTINEL LYMPH NODE BIOPSY Left 09/29/2014   Procedure: WIDE EXCISION MELANOMA LEFT CALF WITH LEFT INGUINAL  SENTINEL LYMPH NODE BIOPSY;  Surgeon: Georganna Skeans, MD;  Location: [place];  Service: General;  Laterality: Left;  . skin lesion excised     non cancerous  :   Current Facility-Administered Medications:  .  0.9 %  sodium chloride infusion, , Intravenous, Continuous, Garba, Mohammad L, MD, Last Rate: 75 mL/hr at 06/03/19 2325, New Bag at 06/03/19 2325 .  acetaminophen (TYLENOL) tablet 650 mg, 650 mg, Oral, Q6H PRN **OR** acetaminophen (TYLENOL) suppository  650 mg, 650 mg, Rectal, Q6H PRN, Jonelle Sidle, Mohammad L, MD .  dexamethasone (DECADRON) injection 20 mg, 20 mg, Intravenous, Daily, Garba, Mohammad L, MD .  enoxaparin (LOVENOX) injection 40 mg, 40 mg, Subcutaneous, QHS, Garba, Mohammad L, MD, 40 mg at 06/04/19 0528 .  folic acid (FOLVITE) tablet 1 mg, 1 mg, Oral, Daily, Jonelle Sidle, Mohammad L, MD, 1 mg at 06/03/19 2119 .  LORazepam (ATIVAN) tablet 1-4 mg, 1-4 mg, Oral, Q1H PRN **OR** LORazepam (ATIVAN) injection 1-4 mg, 1-4 mg, Intravenous, Q1H PRN, Garba, Mohammad L, MD .  morphine 2 MG/ML injection 2 mg, 2 mg, Intravenous, Q4H PRN, Jonelle Sidle, Mohammad L, MD .  multivitamin with minerals tablet 1 tablet, 1 tablet, Oral, Daily, Elwyn Reach, MD, 1 tablet at 06/03/19 2118 .  ondansetron (ZOFRAN) tablet 4 mg, 4 mg, Oral, Q6H  PRN **OR** ondansetron (ZOFRAN) injection 4 mg, 4 mg, Intravenous, Q6H PRN, Jonelle Sidle, Mohammad L, MD .  sodium chloride flush (NS) 0.9 % injection 3 mL, 3 mL, Intravenous, Once, Jonelle Sidle, Mohammad L, MD .  thiamine tablet 100 mg, 100 mg, Oral, Daily, 100 mg at 06/03/19 2119 **OR** thiamine (B-1) injection 100 mg, 100 mg, Intravenous, Daily, Garba, Mohammad L, MD:  . dexamethasone (DECADRON) injection  20 mg Intravenous Daily  . enoxaparin (LOVENOX) injection  40 mg Subcutaneous QHS  . folic acid  1 mg Oral Daily  . multivitamin with minerals  1 tablet Oral Daily  . sodium chloride flush  3 mL Intravenous Once  . thiamine  100 mg Oral Daily   Or  . thiamine  100 mg Intravenous Daily  :  No Known Allergies:  No family history on file.:  Social History   Socioeconomic History  . Marital status: Married    Spouse name: Not on file  . Number of children: Not on file  . Years of education: Not on file  . Highest education level: Not on file  Occupational History  . Not on file  Tobacco Use  . Smoking status: Current Every Day Smoker    Packs/day: 1.00    Years: 50.00    Pack years: 50.00    Types: Cigarettes  . Smokeless tobacco: Never Used  Substance and Sexual Activity  . Alcohol use: Not on file    Comment: socially....beer  wine & liquor  . Drug use: No  . Sexual activity: Not on file  Other Topics Concern  . Not on file  Social History Narrative  . Not on file   Social Determinants of Health   Financial Resource Strain:   . Difficulty of Paying Living Expenses: Not on file  Food Insecurity:   . Worried About Charity fundraiser in the Last Year: Not on file  . Ran Out of Food in the Last Year: Not on file  Transportation Needs:   . Lack of Transportation (Medical): Not on file  . Lack of Transportation (Non-Medical): Not on file  Physical Activity:   . Days of Exercise per Week: Not on file  . Minutes of Exercise per Session: Not on file  Stress:   . Feeling of  Stress : Not on file  Social Connections:   . Frequency of Communication with Friends and Family: Not on file  . Frequency of Social Gatherings with Friends and Family: Not on file  . Attends Religious Services: Not on file  . Active Member of Clubs or Organizations: Not on file  . Attends Archivist Meetings: Not on file  .  Marital Status: Not on file  Intimate Partner Violence:   . Fear of Current or Ex-Partner: Not on file  . Emotionally Abused: Not on file  . Physically Abused: Not on file  . Sexually Abused: Not on file  :  Review of Systems  Constitutional: Positive for weight loss.  HENT: Negative.   Eyes: Negative.   Respiratory: Positive for cough.   Cardiovascular: Negative.   Gastrointestinal: Negative.   Genitourinary: Negative.   Musculoskeletal: Negative.   Skin: Negative.   Neurological: Positive for focal weakness.  Endo/Heme/Allergies: Negative.   Psychiatric/Behavioral: Negative.      Exam: This is a well-developed and well-nourished white male in no obvious distress.  Vital signs temperature of 98.  Pulse 74.  Blood pressure 133/91.  Weight is 132 pounds.  Head neck exam shows no ocular or oral lesions.  There are no palpable cervical or supraclavicular lymph nodes.  Lungs are clear bilaterally.  Cardiac exam regular rate and rhythm with no murmurs, rubs or bruits.  Abdomen is soft.  He has good bowel sounds.  There is no fluid wave.  There is no obvious palpable liver or spleen tip.  Extremities shows weakness of the left arm.  There might be some slight weakness in the left leg.  Neurological exam shows a weakness as described above on the left side.  Skin exam shows no rashes, ecchymoses or petechia.  He has the wide local excision scar in the left lower leg that is well-healed.   Patient Vitals for the past 24 hrs:  BP Temp Temp src Pulse Resp SpO2 Height Weight  06/04/19 0438 (!) 133/91 98 F (36.7 C) Oral 74 16 97 % -- --  06/03/19 2318 133/74  98.1 F (36.7 C) Oral 79 16 97 % 5\' 7"  (1.702 m) 132 lb 8 oz (60.1 kg)  06/03/19 2229 (!) 153/72 98.8 F (37.1 C) Oral 86 18 95 % -- --  06/03/19 1900 123/76 -- -- -- -- -- -- --  06/03/19 1833 112/64 -- -- 87 18 94 % -- --  06/03/19 1655 137/87 -- -- 100 -- -- -- --  06/03/19 1457 137/87 98 F (36.7 C) Oral 99 (!) 24 98 % -- --  06/03/19 1230 122/86 -- -- 95 20 96 % -- --  06/03/19 1112 (!) 153/97 98.6 F (37 C) Oral (!) 112 16 95 % -- --     Recent Labs    06/03/19 1227 06/03/19 1233 06/04/19 0150  WBC 9.2  --  8.6  HGB 16.1 16.7 16.5  HCT 47.6 49.0 47.5  PLT 255  --  266   Recent Labs    06/03/19 1227 06/03/19 1233 06/04/19 0150  NA 136 135 138  K 4.1 3.8 4.2  CL 98 99 102  CO2 24  --  25  GLUCOSE 101* 99 140*  BUN 7* 8 7*  CREATININE 1.00 0.50* 0.67  CALCIUM 9.7  --  9.6    Blood smear review: None  Pathology: Pending    Assessment and Plan: Walter Hernandez is a nice 68 year old white male.  He had a stage Ib superficial spreading melanoma with a left lower leg back in 2016.  I would be shocked if this has recurred in this manner.  I have to believe that we are looking at lung cancer.  I would think that small cell lung cancer is probably more than likely.  I think that he needs to see pulmonary and have a bronchoscopy with  endoscopic ultrasound biopsy.  Looks like he has some nice large peritracheal lymph nodes.  These should be able to be biopsied.  He needs to be seen by radiation oncology.  He will need radiation therapy to the brain.  I would like to think that stereotactic radiosurgery might be an option although there is the one large tumor in the right frontoparietal region that might be too large for stereotactic radiosurgery.  Again, I cannot assume that this is melanoma.  Again I would think that a stage Ib superficial spreading melanoma of the leg would have less than 5% chance of recurrence in this manner.  Again, I do believe that this is a  bronchogenic carcinoma.  I think the best outcome would be small cell carcinoma as this should be relatively treatable.  I cannot think of any other studies that need to be done.  He cannot have a PET scan done.  I spoke to him at length.  It was good to see him again.  I just feel bad that we are looking at likely another malignancy with him.  Lattie Haw, MD  [place] 12:12

## 2019-06-04 NOTE — Consult Note (Signed)
NAMELeighton Hernandez, MRN:  [id number], DOB:  Apr 30, 1951, LOS: 1 ADMISSION DATE:  06/03/2019, CONSULTATION DATE:  12/29 REFERRING MD:  Walter Hernandez, CHIEF COMPLAINT:  LUE weakness  Brief History   68 yo male with PMH significant for lb melanoma (2016), HTN admitted with progressive left arm weakness over the last several weeks, found to have right posterior frontal lobe mass, right frontal lobe and left frontal lobe mass on MRI Brain consistent with metastatic disease.  Subsequent imaging revealed, extensive mediastinal adenopathy, pulmonary nodules, and liver lesions.  PCCM consulted for possible bronch   History of present illness   68 yo male with complaints of left upper extremity weakness that started several weeks ago.  Weakness is only in LUE. It has progressively worsened over the last several weeks. He was attempted to see neurology as outpatient but was unable to stand yesterday and decided to seek treatment at [facility] ED.  Imaging revealed multiple brain masses with vasogenic edema, lung nodules with adenopathy and hepatic lesion.  He was started on decadron and oncology consulted.  PCCM consulted today for evaluation of bronchoscopy. Patient is a 1-2 PPD smoker x 40 years.  He has no respiratory limitations at home, he works as a Estate manager/land agent and has been able to do complete his job without dyspnea.  He does endorse chronic hacking cough that is non productive.  No bronchodilators use at home. He denies any weight loss and endorses healthy appetite. Previous surgery of left leg melanoma removal. He had no complications with anesthesia at that time.   Past Medical History  Melanoma 1B 1016 -left lower leg HTN  Significant Hospital Events     Consults:  Oncology PCCM  Procedures:    Significant Diagnostic Tests:  12/28: CTH: Abnormality of the right frontoparietal region with extensive vasogenic edema.  Further vasogenic edema within the left posterior parietal and left  frontal edema. 12/28: CT Chest: Multiple lymph nodes: extensive mediastinal adenopathy, subcarinal, right hilar, nodes. Centrilobular emphysema.  RML pulmonary nodule. RLL pulmonary nodule.   12/28: CT Abdomen/Pelvis: Hypoattenuating liver lesions.  Scattered colonic diverticula.  Porta hepatis adenopathy, nodal mass   Micro Data:    Antimicrobials:    Interim history/subjective:  New consult, see above  Objective   Blood pressure (!) 133/91, pulse 74, temperature 98 F (36.7 C), temperature source Oral, resp. rate 16, height 5\' 7"  (1.702 m), weight 60.1 kg, SpO2 97 %.        Intake/Output Summary (Last 24 hours) at 06/04/2019 1358 Last data filed at 06/04/2019 0141 Gross per 24 hour  Intake -  Output 250 ml  Net -250 ml   Filed Weights   06/03/19 2318  Weight: 60.1 kg    Examination HENT: Adult male resting in bed Lungs: scattered rhochi at bases, symmetric expansion, + cough Cardiovascular: S1,S2.  Regular rate and rhythm. No edema Abdomen: Soft, non tender, non distended, + bowel sounds Extremities: No deformities Neuro: A&O, LUE weakness 2/5.  5/5 strength to RUE and BLE GU: Deferred  Resolved Hospital Problem list     Assessment & Plan:  Multiple small pulmonary nodules, mediastinal and hilar adenopathy -Concern for primary lung cancer with mets.  P: --Will plan for EBUS with biopsy 12/30. NPO after midnight. Patient counseled on risks/benefits, expected time line of pathology results.   Metastatic Disease: Multiple Brain lesions Hepatic lesions --Decadron --Further treatment plans pending pathology results   Tobacco use --1-2 ppd x 40 years --Will need smoking cessation  counseling   HTN -Home meds: Norvasc -Management per primary team  Best practice:  Diet: Regular Diet Pain/Anxiety/Delirium protocol (if indicated): NA VAP protocol (if indicated): NA DVT prophylaxis: Mobilize GI prophylaxis: PPI Glucose control: Controlled without  intervention Mobility: Mobilize as able Code Status: FULL Family Communication: Wife at bedside Disposition: Pulmonary will continue to follow, plan for EBUS 12/30  Labs   CBC: Recent Labs  Lab 06/03/19 1227 06/03/19 1233 06/04/19 0150  WBC 9.2  --  8.6  NEUTROABS 6.6  --   --   HGB 16.1 16.7 16.5  HCT 47.6 49.0 47.5  MCV 100.6*  --  98.1  PLT 255  --  601    Basic Metabolic Panel: Recent Labs  Lab 06/03/19 1227 06/03/19 1233 06/04/19 0150  NA 136 135 138  K 4.1 3.8 4.2  CL 98 99 102  CO2 24  --  25  GLUCOSE 101* 99 140*  BUN 7* 8 7*  CREATININE 1.00 0.50* 0.67  CALCIUM 9.7  --  9.6   GFR: Estimated Creatinine Clearance: 73 mL/min (by C-G formula based on SCr of 0.67 mg/dL). Recent Labs  Lab 06/03/19 1227 06/04/19 0150  WBC 9.2 8.6    Liver Function Tests: Recent Labs  Lab 06/03/19 1227 06/04/19 0150  AST 28 24  ALT 12 13  ALKPHOS 62 72  BILITOT 1.0 0.8  PROT 7.1 6.9  ALBUMIN 3.9 3.7   No results for input(s): LIPASE, AMYLASE in the last 168 hours. No results for input(s): AMMONIA in the last 168 hours.  ABG    Component Value Date/Time   TCO2 25 06/03/2019 1233     Coagulation Profile: Recent Labs  Lab 06/03/19 1227  INR 0.9    Cardiac Enzymes: No results for input(s): CKTOTAL, CKMB, CKMBINDEX, TROPONINI in the last 168 hours.  HbA1C: No results found for: HGBA1C  CBG: Recent Labs  Lab 06/03/19 1453  GLUCAP 104*    Review of Systems:   Review of Systems  Constitutional: Negative.   HENT: Negative.   Eyes: Negative.   Respiratory: Positive for cough.   Cardiovascular: Negative.   Gastrointestinal: Negative.   Genitourinary: Negative.   Musculoskeletal: Negative.   Skin: Negative.   Neurological: Positive for focal weakness.  Endo/Heme/Allergies: Negative.   Psychiatric/Behavioral: Negative.     Past Medical History  He,  has a past medical history of Cancer ([place]), GERD (gastroesophageal reflux disease), History of  hiatal hernia, Hypertension, and Malignant melanoma of skin of left lower leg ([place]) (07/28/2015).   Surgical History    Past Surgical History:  Procedure Laterality Date  . CARDIAC CATHETERIZATION     15 yrs ago...came out normal   -- hiatal hernia  . MELANOMA EXCISION WITH SENTINEL LYMPH NODE BIOPSY Left 09/29/2014   Procedure: WIDE EXCISION MELANOMA LEFT CALF WITH LEFT INGUINAL  SENTINEL LYMPH NODE BIOPSY;  Surgeon: Georganna Skeans, MD;  Location: [place];  Service: General;  Laterality: Left;  . skin lesion excised     non cancerous     Social History   reports that he has been smoking cigarettes. He has a 50.00 pack-year smoking history. He has never used smokeless tobacco. He reports that he does not use drugs.   Family History   His family history is not on file.   Allergies No Known Allergies   Home Medications  Prior to Admission medications   Medication Sig Start Date End Date Taking? Authorizing Provider  acetaminophen (TYLENOL) 500 MG tablet Take  1,000 mg by mouth every 6 (six) hours as needed for moderate pain.   Yes [provider]  amLODipine (NORVASC) 5 MG tablet Take 5 mg by mouth daily.    Yes [provider]  IBUPROFEN & ACETAMINOPHEN PO Take 1-2 tablets by mouth every 6 (six) hours as needed (pain).   Yes [provider]  Multiple Vitamin (MULTIVITAMIN) capsule Take 1 capsule by mouth daily.   Yes [provider]  Multiple Vitamins-Minerals (EMERGEN-C IMMUNE PLUS) PACK Take 1 Package by mouth daily as needed (immune system).   Yes [provider]  Naphazoline-Pheniramine (OPCON-A OP) Place 2 drops into both eyes as needed (for dry eyes).   Yes [provider]  omeprazole (PRILOSEC) 40 MG capsule Take 40 mg by mouth daily.   Yes [provider]  tamsulosin (FLOMAX) 0.4 MG CAPS capsule Take 0.4 mg by mouth daily. 07/17/15  Yes [provider]     Paulita Fujita, ACNP [facility]  After hours pager: [phone]

## 2019-06-04 NOTE — H&P (View-Only) (Signed)
NAMEAntoney Hernandez, MRN:  [id number], DOB:  10/30/1950, LOS: 1 ADMISSION DATE:  06/03/2019, CONSULTATION DATE:  12/29 REFERRING MD:  Berle Mull, CHIEF COMPLAINT:  LUE weakness  Brief History   68 yo male with PMH significant for lb melanoma (2016), HTN admitted with progressive left arm weakness over the last several weeks, found to have right posterior frontal lobe mass, right frontal lobe and left frontal lobe mass on MRI Brain consistent with metastatic disease.  Subsequent imaging revealed, extensive mediastinal adenopathy, pulmonary nodules, and liver lesions.  PCCM consulted for possible bronch   History of present illness   68 yo male with complaints of left upper extremity weakness that started several weeks ago.  Weakness is only in LUE. It has progressively worsened over the last several weeks. He was attempted to see neurology as outpatient but was unable to stand yesterday and decided to seek treatment at [facility] ED.  Imaging revealed multiple brain masses with vasogenic edema, lung nodules with adenopathy and hepatic lesion.  He was started on decadron and oncology consulted.  PCCM consulted today for evaluation of bronchoscopy. Patient is a 1-2 PPD smoker x 40 years.  He has no respiratory limitations at home, he works as a Estate manager/land agent and has been able to do complete his job without dyspnea.  He does endorse chronic hacking cough that is non productive.  No bronchodilators use at home. He denies any weight loss and endorses healthy appetite. Previous surgery of left leg melanoma removal. He had no complications with anesthesia at that time.   Past Medical History  Melanoma 1B 1016 -left lower leg HTN  Significant Hospital Events     Consults:  Oncology PCCM  Procedures:    Significant Diagnostic Tests:  12/28: CTH: Abnormality of the right frontoparietal region with extensive vasogenic edema.  Further vasogenic edema within the left posterior parietal and left  frontal edema. 12/28: CT Chest: Multiple lymph nodes: extensive mediastinal adenopathy, subcarinal, right hilar, nodes. Centrilobular emphysema.  RML pulmonary nodule. RLL pulmonary nodule.   12/28: CT Abdomen/Pelvis: Hypoattenuating liver lesions.  Scattered colonic diverticula.  Porta hepatis adenopathy, nodal mass   Micro Data:    Antimicrobials:    Interim history/subjective:  New consult, see above  Objective   Blood pressure (!) 133/91, pulse 74, temperature 98 F (36.7 C), temperature source Oral, resp. rate 16, height 5\' 7"  (1.702 m), weight 60.1 kg, SpO2 97 %.        Intake/Output Summary (Last 24 hours) at 06/04/2019 1358 Last data filed at 06/04/2019 0141 Gross per 24 hour  Intake --  Output 250 ml  Net -250 ml   Filed Weights   06/03/19 2318  Weight: 60.1 kg    Examination HENT: Adult male resting in bed Lungs: scattered rhochi at bases, symmetric expansion, + cough Cardiovascular: S1,S2.  Regular rate and rhythm. No edema Abdomen: Soft, non tender, non distended, + bowel sounds Extremities: No deformities Neuro: A&O, LUE weakness 2/5.  5/5 strength to RUE and BLE GU: Deferred  Resolved Hospital Problem list     Assessment & Plan:  Multiple small pulmonary nodules, mediastinal and hilar adenopathy -Concern for primary lung cancer with mets.  P: --Will plan for EBUS with biopsy 12/30. NPO after midnight. Patient counseled on risks/benefits, expected time line of pathology results.   Metastatic Disease: Multiple Brain lesions Hepatic lesions --Decadron --Further treatment plans pending pathology results   Tobacco use --1-2 ppd x 40 years --Will need smoking cessation  counseling   HTN -Home meds: Norvasc -Management per primary team  Best practice:  Diet: Regular Diet Pain/Anxiety/Delirium protocol (if indicated): NA VAP protocol (if indicated): NA DVT prophylaxis: Mobilize GI prophylaxis: PPI Glucose control: Controlled without  intervention Mobility: Mobilize as able Code Status: FULL Family Communication: Wife at bedside Disposition: Pulmonary will continue to follow, plan for EBUS 12/30  Labs   CBC: Recent Labs  Lab 06/03/19 1227 06/03/19 1233 06/04/19 0150  WBC 9.2  --  8.6  NEUTROABS 6.6  --   --   HGB 16.1 16.7 16.5  HCT 47.6 49.0 47.5  MCV 100.6*  --  98.1  PLT 255  --  409    Basic Metabolic Panel: Recent Labs  Lab 06/03/19 1227 06/03/19 1233 06/04/19 0150  NA 136 135 138  K 4.1 3.8 4.2  CL 98 99 102  CO2 24  --  25  GLUCOSE 101* 99 140*  BUN 7* 8 7*  CREATININE 1.00 0.50* 0.67  CALCIUM 9.7  --  9.6   GFR: Estimated Creatinine Clearance: 73 mL/min (by C-G formula based on SCr of 0.67 mg/dL). Recent Labs  Lab 06/03/19 1227 06/04/19 0150  WBC 9.2 8.6    Liver Function Tests: Recent Labs  Lab 06/03/19 1227 06/04/19 0150  AST 28 24  ALT 12 13  ALKPHOS 62 72  BILITOT 1.0 0.8  PROT 7.1 6.9  ALBUMIN 3.9 3.7   No results for input(s): LIPASE, AMYLASE in the last 168 hours. No results for input(s): AMMONIA in the last 168 hours.  ABG    Component Value Date/Time   TCO2 25 06/03/2019 1233     Coagulation Profile: Recent Labs  Lab 06/03/19 1227  INR 0.9    Cardiac Enzymes: No results for input(s): CKTOTAL, CKMB, CKMBINDEX, TROPONINI in the last 168 hours.  HbA1C: No results found for: HGBA1C  CBG: Recent Labs  Lab 06/03/19 1453  GLUCAP 104*    Review of Systems:   Review of Systems  Constitutional: Negative.   HENT: Negative.   Eyes: Negative.   Respiratory: Positive for cough.   Cardiovascular: Negative.   Gastrointestinal: Negative.   Genitourinary: Negative.   Musculoskeletal: Negative.   Skin: Negative.   Neurological: Positive for focal weakness.  Endo/Heme/Allergies: Negative.   Psychiatric/Behavioral: Negative.     Past Medical History  He,  has a past medical history of Cancer ([place]), GERD (gastroesophageal reflux disease), History of  hiatal hernia, Hypertension, and Malignant melanoma of skin of left lower leg ([place]) (07/28/2015).   Surgical History    Past Surgical History:  Procedure Laterality Date  . CARDIAC CATHETERIZATION     15 yrs ago...came out normal   -- hiatal hernia  . MELANOMA EXCISION WITH SENTINEL LYMPH NODE BIOPSY Left 09/29/2014   Procedure: WIDE EXCISION MELANOMA LEFT CALF WITH LEFT INGUINAL  SENTINEL LYMPH NODE BIOPSY;  Surgeon: Georganna Skeans, MD;  Location: [place];  Service: General;  Laterality: Left;  . skin lesion excised     non cancerous     Social History   reports that he has been smoking cigarettes. He has a 50.00 pack-year smoking history. He has never used smokeless tobacco. He reports that he does not use drugs.   Family History   His family history is not on file.   Allergies No Known Allergies   Home Medications  Prior to Admission medications   Medication Sig Start Date End Date Taking? Authorizing Provider  acetaminophen (TYLENOL) 500 MG tablet Take  1,000 mg by mouth every 6 (six) hours as needed for moderate pain.   Yes [provider]  amLODipine (NORVASC) 5 MG tablet Take 5 mg by mouth daily.    Yes [provider]  IBUPROFEN & ACETAMINOPHEN PO Take 1-2 tablets by mouth every 6 (six) hours as needed (pain).   Yes [provider]  Multiple Vitamin (MULTIVITAMIN) capsule Take 1 capsule by mouth daily.   Yes [provider]  Multiple Vitamins-Minerals (EMERGEN-C IMMUNE PLUS) PACK Take 1 Package by mouth daily as needed (immune system).   Yes [provider]  Naphazoline-Pheniramine (OPCON-A OP) Place 2 drops into both eyes as needed (for dry eyes).   Yes [provider]  omeprazole (PRILOSEC) 40 MG capsule Take 40 mg by mouth daily.   Yes [provider]  tamsulosin (FLOMAX) 0.4 MG CAPS capsule Take 0.4 mg by mouth daily. 07/17/15  Yes [provider]     Paulita Fujita, ACNP [facility]  After hours pager: [phone]

## 2019-06-04 NOTE — Progress Notes (Signed)
[facility] Progress Note  Patient: Walter Hernandez YSA:[id number]   PCP: Lavone Orn, MD DOB: 1950/10/03   DOA: 06/03/2019   DOS: 06/04/2019   Date of Service: the patient was seen and examined on 06/04/2019  Chief Complaint  Patient presents with   Stroke Symptoms   Brief hospital course: Pt. with PMH of malignant melanoma of the skin which was excised in 2016, GERD, hypertension; presented with complain of left-sided weakness, was found to have multiple metastatic brain lesion as well as mediastinal adenopathy.  Medical oncology, radiation oncology, pulmonary consulted.  Patient scheduled for bronchoscopy on 06/05/2019.  Currently further plan is monitor for bronchoscopy recovery and plan for discharge possibly on 06/05/2019.  Subjective: Continues to have left-sided weakness.  Motor upper extremity than lower extremity.  No chest pain abdominal pain no fever no chills.  No nausea no vomiting.  Assessment and Plan: Scheduled Meds:  amLODipine  5 mg Oral Daily   dexamethasone (DECADRON) injection  20 mg Intravenous Daily   enoxaparin (LOVENOX) injection  40 mg Subcutaneous QHS   folic acid  1 mg Oral Daily   multivitamin with minerals  1 tablet Oral Daily   pantoprazole  40 mg Oral Daily   sodium chloride flush  3 mL Intravenous Once   tamsulosin  0.4 mg Oral Daily   thiamine  100 mg Oral Daily   Or   thiamine  100 mg Intravenous Daily   Continuous Infusions: PRN Meds: acetaminophen **OR** acetaminophen, LORazepam **OR** LORazepam, morphine injection, naphazoline-pheniramine, ondansetron **OR** ondansetron (ZOFRAN) IV  1.  Left-sided weakness. Metastatic brain lesions. History of malignant melanoma. Mediastinal adenopathy. Concern for bronchogenic primary carcinoma Medical oncology consulted. Also pulmonary was consulted. Patient is scheduled for bronchoscopy on 06/05/2019. Patient will require an outpatient PET scan. Patient will also see radiation  oncology at the hospital. PT and OT has been consulted.  Patient may benefit from having home health at home. Likely can discharge home after procedure.  2.  GERD Continue PPI  3.  Essential hypertension Continue Norvasc  4.  BPH  Continue home regimen.  Diet: Cardiac diet  DVT Prophylaxis: SCD, pharmacological prophylaxis contraindicated due to Bronchoscopy   Advance goals of care discussion: Full code  Family Communication: family was present at bedside, at the time of interview. The pt provided permission to discuss medical plan with the family. Opportunity was given to ask question and all questions were answered satisfactorily.   Disposition:  Discharge to home tomorrow 06/05/2019 post bronchoscopy.  Consultants: Medical oncology ,radiation oncology, pulmonary Procedures: none  Antibiotics: Anti-infectives (From admission, onward)   None       Objective: Physical Exam: Vitals:   06/03/19 1900 06/03/19 2229 06/03/19 2318 06/04/19 0438  BP: 123/76 (!) 153/72 133/74 (!) 133/91  Pulse:  86 79 74  Resp:  18 16 16   Temp:  98.8 F (37.1 C) 98.1 F (36.7 C) 98 F (36.7 C)  TempSrc:  Oral Oral Oral  SpO2:  95% 97% 97%  Weight:   60.1 kg   Height:   5\' 7"  (1.702 m)     Intake/Output Summary (Last 24 hours) at 06/04/2019 1621 Last data filed at 06/04/2019 0141 Gross per 24 hour  Intake --  Output 250 ml  Net -250 ml   Filed Weights   06/03/19 2318  Weight: 60.1 kg   General: alert and oriented to time and place. Appear in mild distress, affect appropriate Eyes: PERRL, Conjunctiva normal ENT: Oral Mucosa Clear, moist  Neck:  no JVD, no Abnormal Mass Or lumps Cardiovascular: S1 and S2 Present, no Murmur,  Respiratory: good respiratory effort, Bilateral Air entry equal and Decreased, no signs of accessory muscle use, Clear to Auscultation, no Crackles, no wheezes Abdomen: Bowel Sound present, Soft and no tenderness, no hernia Skin: no rashes  Extremities: no  Pedal edema, no calf tenderness Neurologic: without any new focal findings  gait not checked due to patient safety concerns  Data Reviewed: I have personally reviewed and interpreted daily labs, tele strips, imagings as discussed above. I reviewed all nursing notes, pharmacy notes, vitals, pertinent old records I have discussed plan of care as described above with RN and patient/family.  CBC: Recent Labs  Lab 06/03/19 1227 06/03/19 1233 06/04/19 0150  WBC 9.2  --  8.6  NEUTROABS 6.6  --   --   HGB 16.1 16.7 16.5  HCT 47.6 49.0 47.5  MCV 100.6*  --  98.1  PLT 255  --  149   Basic Metabolic Panel: Recent Labs  Lab 06/03/19 1227 06/03/19 1233 06/04/19 0150  NA 136 135 138  K 4.1 3.8 4.2  CL 98 99 102  CO2 24  --  25  GLUCOSE 101* 99 140*  BUN 7* 8 7*  CREATININE 1.00 0.50* 0.67  CALCIUM 9.7  --  9.6    Liver Function Tests: Recent Labs  Lab 06/03/19 1227 06/04/19 0150  AST 28 24  ALT 12 13  ALKPHOS 62 72  BILITOT 1.0 0.8  PROT 7.1 6.9  ALBUMIN 3.9 3.7   No results for input(s): LIPASE, AMYLASE in the last 168 hours. No results for input(s): AMMONIA in the last 168 hours. Coagulation Profile: Recent Labs  Lab 06/03/19 1227  INR 0.9   Cardiac Enzymes: No results for input(s): CKTOTAL, CKMB, CKMBINDEX, TROPONINI in the last 168 hours. BNP (last 3 results) No results for input(s): PROBNP in the last 8760 hours. CBG: Recent Labs  Lab 06/03/19 1453  GLUCAP 104*   Studies: CT Chest W Contrast  Result Date: 06/03/2019 CLINICAL DATA:  Brain MR demonstrating metastatic disease. History of melanoma 4 years ago. EXAM: CT CHEST, ABDOMEN, AND PELVIS WITH CONTRAST TECHNIQUE: Multidetector CT imaging of the chest, abdomen and pelvis was performed following the standard protocol during bolus administration of intravenous contrast. CONTRAST:  130mL OMNIPAQUE IOHEXOL 300 MG/ML  SOLN COMPARISON:  Chest radiograph report 04/29/99 brain MR of earlier today. FINDINGS: CT  CHEST FINDINGS Cardiovascular: Mild motion degradation involving the upper and mid chest. Aortic and branch vessel atherosclerosis. Tortuous thoracic aorta. Normal heart size, without pericardial effusion. No central pulmonary embolism, on this non-dedicated study. Mediastinum/Nodes: Extensive mediastinal adenopathy. Right paratracheal node measures 1.6 cm on 18/3. Subcarinal node measures 1.5 cm on 29/3. Right hilar adenopathy at 1.6 cm on 30/3. Prevascular node of 1.0 cm on 22/3. Lungs/Pleura: No pleural fluid. Bronchial wall thickening is most significant in the right middle and right lower lobes, including on 82/5. Centrilobular emphysema. Right middle lobe 5 mm pulmonary nodule on 96/5. Right lower lobe 1.0 cm nodule including on 84/5. Likely calcified 2 mm posterior right upper lobe pulmonary nodule on 63/5. Musculoskeletal: No acute osseous abnormality. CT ABDOMEN PELVIS FINDINGS Hepatobiliary: Vague hypoattenuating liver lesions bilaterally. Example in the right hepatic lobe at 8 mm on 54/3 and within the lateral segment left liver lobe 1.1 cm on 57/3. Normal gallbladder, without biliary ductal dilatation. Pancreas: Normal, without mass or ductal dilatation. Spleen: Normal in size, without focal abnormality. Adrenals/Urinary Tract: Normal  adrenal glands. Normal left kidney. Too small to characterize interpolar right renal lesion. No hydronephrosis. Bladder wall irregularity with small saccules posteriorly. Stomach/Bowel: Proximal gastric underdistention. Scattered colonic diverticula. Normal terminal ileum. Normal small bowel. Vascular/Lymphatic: Advanced aortic and branch vessel atherosclerosis. Porta hepatis adenopathy, including a nodal mass of 2.6 x 3.1 cm on 66/3. No pelvic sidewall adenopathy. Reproductive: Mild prostatomegaly. Other: No significant free fluid. No evidence of omental or peritoneal disease. Musculoskeletal: Thoracolumbar spondylosis. S-shaped thoracolumbar spine curvature. IMPRESSION:  1. Thoracoabdominal adenopathy and liver lesions, favored to represent metastatic disease. Given emphysema and small pulmonary nodules, differential considerations include metastatic bronchogenic carcinoma and melanoma. 2. Motion degraded evaluation of the upper chest. 3. Aortic atherosclerosis (ICD10-I70.0) and emphysema (ICD10-J43.9). 4. Prostatomegaly with a component of bladder outlet obstruction. Electronically Signed   By: Abigail Miyamoto M.D.   On: 06/03/2019 20:02   MR Brain W and Wo Contrast  Result Date: 06/03/2019 CLINICAL DATA:  Left arm weakness.  Abnormal CT head EXAM: MRI HEAD WITHOUT AND WITH CONTRAST TECHNIQUE: Multiplanar, multiecho pulse sequences of the brain and surrounding structures were obtained without and with intravenous contrast. CONTRAST:  7mL GADAVIST GADOBUTROL 1 MMOL/ML IV SOLN COMPARISON:  CT head 06/03/2019 FINDINGS: Brain: Multiple enhancing mass lesions are present in the brain. Largest in the right posterior frontal lobe shows homogeneous enhancement measuring 35 x 27 mm. The mass appears to infiltrate and thicken the cortex with extensive adjacent vasogenic edema. This corresponds to the primary CT abnormality Additional smaller enhancing mass lesions are present. 6 mm enhancing lesion right frontal lobe. 11 mm enhancing lesion left frontal lobe with mild edema. 9 mm enhancing lesion left frontal lobe. 3 mm enhancing lesion left temporoparietal periventricular white matter. 12 mm enhancing lesion left posterior parietal lobe with edema. These lesions show significant restricted diffusion. No associated hemorrhage Ventricle size normal.  No midline shift. Vascular: Normal arterial flow voids. Skull and upper cervical spine: No focal skeletal lesion. Sinuses/Orbits: Mild mucosal edema paranasal sinuses.  Normal orbit Other: None IMPRESSION: Multiple enhancing mass lesions in the brain with surrounding edema compatible with metastatic disease. Patient has history of melanoma.  Electronically Signed   By: Franchot Gallo M.D.   On: 06/03/2019 18:13   CT ABDOMEN PELVIS W CONTRAST  Result Date: 06/03/2019 CLINICAL DATA:  Brain MR demonstrating metastatic disease. History of melanoma 4 years ago. EXAM: CT CHEST, ABDOMEN, AND PELVIS WITH CONTRAST TECHNIQUE: Multidetector CT imaging of the chest, abdomen and pelvis was performed following the standard protocol during bolus administration of intravenous contrast. CONTRAST:  146mL OMNIPAQUE IOHEXOL 300 MG/ML  SOLN COMPARISON:  Chest radiograph report 04/29/99 brain MR of earlier today. FINDINGS: CT CHEST FINDINGS Cardiovascular: Mild motion degradation involving the upper and mid chest. Aortic and branch vessel atherosclerosis. Tortuous thoracic aorta. Normal heart size, without pericardial effusion. No central pulmonary embolism, on this non-dedicated study. Mediastinum/Nodes: Extensive mediastinal adenopathy. Right paratracheal node measures 1.6 cm on 18/3. Subcarinal node measures 1.5 cm on 29/3. Right hilar adenopathy at 1.6 cm on 30/3. Prevascular node of 1.0 cm on 22/3. Lungs/Pleura: No pleural fluid. Bronchial wall thickening is most significant in the right middle and right lower lobes, including on 82/5. Centrilobular emphysema. Right middle lobe 5 mm pulmonary nodule on 96/5. Right lower lobe 1.0 cm nodule including on 84/5. Likely calcified 2 mm posterior right upper lobe pulmonary nodule on 63/5. Musculoskeletal: No acute osseous abnormality. CT ABDOMEN PELVIS FINDINGS Hepatobiliary: Vague hypoattenuating liver lesions bilaterally. Example in the right hepatic lobe  at 8 mm on 54/3 and within the lateral segment left liver lobe 1.1 cm on 57/3. Normal gallbladder, without biliary ductal dilatation. Pancreas: Normal, without mass or ductal dilatation. Spleen: Normal in size, without focal abnormality. Adrenals/Urinary Tract: Normal adrenal glands. Normal left kidney. Too small to characterize interpolar right renal lesion. No  hydronephrosis. Bladder wall irregularity with small saccules posteriorly. Stomach/Bowel: Proximal gastric underdistention. Scattered colonic diverticula. Normal terminal ileum. Normal small bowel. Vascular/Lymphatic: Advanced aortic and branch vessel atherosclerosis. Porta hepatis adenopathy, including a nodal mass of 2.6 x 3.1 cm on 66/3. No pelvic sidewall adenopathy. Reproductive: Mild prostatomegaly. Other: No significant free fluid. No evidence of omental or peritoneal disease. Musculoskeletal: Thoracolumbar spondylosis. S-shaped thoracolumbar spine curvature. IMPRESSION: 1. Thoracoabdominal adenopathy and liver lesions, favored to represent metastatic disease. Given emphysema and small pulmonary nodules, differential considerations include metastatic bronchogenic carcinoma and melanoma. 2. Motion degraded evaluation of the upper chest. 3. Aortic atherosclerosis (ICD10-I70.0) and emphysema (ICD10-J43.9). 4. Prostatomegaly with a component of bladder outlet obstruction. Electronically Signed   By: Abigail Miyamoto M.D.   On: 06/03/2019 20:02     Time spent: 35 minutes  Author: Berle Mull, MD [facility] 06/04/2019 4:21 PM  To reach On-call, see care teams to locate the attending and reach out to them via [email or web address]. If 7PM-7AM, please contact night-coverage If you still have difficulty reaching the attending provider, please page the [facility] (Director on Call) for [facility] on amion for assistance.

## 2019-06-04 NOTE — Consult Note (Signed)
Radiation Oncology         ([phone]) [phone] ________________________________  Initial inpatient Consultation- Conducted via telephone due to current COVID-19 concerns for limiting patient exposure  Name: Walter Hernandez MRN: [id number]  Date of Service: 06/03/2019 DOB: [birth year]  FF:[id number], Jenny Reichmann, MD, Marin Olp Rudell Cobb, MD   REFERRING PHYSICIAN: Volanda Napoleon, MD   DIAGNOSIS: The primary encounter diagnosis was Left arm weakness. Diagnoses of Left arm numbness, Left leg weakness, Left leg numbness, and Brain metastases (HCC) were also pertinent to this visit.    ICD-10-CM   1. Left arm weakness  R29.898   2. Left arm numbness  R20.0   3. Left leg weakness  R29.898   4. Left leg numbness  R20.0   5. Brain metastases ([place])  C79.31     HISTORY OF PRESENT ILLNESS: Walter Hernandez is a 68 y.o. male seen at the request of Dr. Marin Olp. He is an established patient of Dr. Marin Olp since 01/2015, followed previously for stage Ib melanoma of the left lower leg, with his most recent appointment in 01/2019.  Since that appointment, he developed weakness in his left arm in the beginning of 05/2019. The weakness worsened over the next few weeks, and developed into his left leg for approximately 2 days prior to admission, and he ultimately presented to the ED on 06/03/2019 for evaluation. He denied any headache, N/V, dizziness, visual changes or difficulty with speech. With his blood work stable, he underwent head CT which revealed pronounced abnormality of the right frontoparietal region with extensive vasogenic edema and probable abnormal cortical thickening and hyperdensity with a smaller area of edema within the left posterior and frontal area felt most likely to represent occult metastatic disease.   He proceeded to brain MRI for further evaluation and this confirmed multiple enhancing mass lesions in the brain with a 35 mm mass in the right posterior frontal lobe, a 6 mm right frontal lobe lesion, an 11  mm left frontal lobe lesion, and multiple other lesions in the cerebrum with surrounding edema compatible with metastatic disease. He was started on decadron 20mg  IV daily.    He underwent CT C/A/P for disease staging following the brain MRI and this exam revealed extensive mediastinal and hilar adenopathy, bilateral lung nodules, Porta hepatis adenopathy, including a nodal mass of 2.6 x 3.1 cm and some vague hypoattenuating bilateral liver lesions. Given emphysema and small pulmonary nodules, differential considerations include metastatic bronchogenic carcinoma and melanoma.  He was seen by Dr. Marin Olp earlier today, who recommended he be seen by pulmonology, for consideration of bronchoscopy for biopsy/tissue confirmation. He is scheduled to undergo bronchoscopy tomorrow, 06/05/2019 with Dr. Ina Homes.  He has been kindly referred to [place] today to discuss the role of radiation therapy in the management of the brain metastases.    PREVIOUS RADIATION THERAPY: No  PAST MEDICAL HISTORY:  Past Medical History:  Diagnosis Date  . Cancer [facility])    recently discovered melanoma  . GERD (gastroesophageal reflux disease)   . History of hiatal hernia   . Hypertension    recently placed on new blood pressure med  . Malignant melanoma of skin of left lower leg ([place]) 07/28/2015      PAST SURGICAL HISTORY: Past Surgical History:  Procedure Laterality Date  . CARDIAC CATHETERIZATION     15 yrs ago...came out normal   -- hiatal hernia  . MELANOMA EXCISION WITH SENTINEL LYMPH NODE BIOPSY Left 09/29/2014   Procedure: WIDE EXCISION MELANOMA LEFT  CALF WITH LEFT INGUINAL  SENTINEL LYMPH NODE BIOPSY;  Surgeon: Georganna Skeans, MD;  Location: [facility];  Service: General;  Laterality: Left;  . skin lesion excised     non cancerous    FAMILY HISTORY: No family history on file.  SOCIAL HISTORY:  Social History   Socioeconomic History  . Marital status: Married    Spouse name: Not on file  . Number of  children: Not on file  . Years of education: Not on file  . Highest education level: Not on file  Occupational History  . Not on file  Tobacco Use  . Smoking status: Current Every Day Smoker    Packs/day: 1.00    Years: 50.00    Pack years: 50.00    Types: Cigarettes  . Smokeless tobacco: Never Used  Substance and Sexual Activity  . Alcohol use: Not on file    Comment: socially....beer  wine & liquor  . Drug use: No  . Sexual activity: Not on file  Other Topics Concern  . Not on file  Social History Narrative  . Not on file   Social Determinants of Health   Financial Resource Strain:   . Difficulty of Paying Living Expenses: Not on file  Food Insecurity:   . Worried About Charity fundraiser in the Last Year: Not on file  . Ran Out of Food in the Last Year: Not on file  Transportation Needs:   . Lack of Transportation (Medical): Not on file  . Lack of Transportation (Non-Medical): Not on file  Physical Activity:   . Days of Exercise per Week: Not on file  . Minutes of Exercise per Session: Not on file  Stress:   . Feeling of Stress : Not on file  Social Connections:   . Frequency of Communication with Friends and Family: Not on file  . Frequency of Social Gatherings with Friends and Family: Not on file  . Attends Religious Services: Not on file  . Active Member of Clubs or Organizations: Not on file  . Attends Archivist Meetings: Not on file  . Marital Status: Not on file  Intimate Partner Violence:   . Fear of Current or Ex-Partner: Not on file  . Emotionally Abused: Not on file  . Physically Abused: Not on file  . Sexually Abused: Not on file    ALLERGIES: Patient has no known allergies.  MEDICATIONS:  Current Facility-Administered Medications  Medication Dose Route Frequency Provider Last Rate Last Admin  . acetaminophen (TYLENOL) tablet 650 mg  650 mg Oral Q6H PRN Elwyn Reach, MD       Or  . acetaminophen (TYLENOL) suppository 650 mg  650  mg Rectal Q6H PRN Gala Romney L, MD      . amLODipine (NORVASC) tablet 5 mg  5 mg Oral Daily Lavina Hamman, MD   5 mg at 06/04/19 1316  . dexamethasone (DECADRON) injection 20 mg  20 mg Intravenous Daily Elwyn Reach, MD   20 mg at 06/04/19 0942  . enoxaparin (LOVENOX) injection 40 mg  40 mg Subcutaneous QHS Gala Romney L, MD   40 mg at 06/04/19 0528  . folic acid (FOLVITE) tablet 1 mg  1 mg Oral Daily Elwyn Reach, MD   1 mg at 06/04/19 0941  . LORazepam (ATIVAN) tablet 1-4 mg  1-4 mg Oral Q1H PRN Elwyn Reach, MD       Or  . LORazepam (ATIVAN) injection 1-4 mg  1-4  mg Intravenous Q1H PRN Gala Romney L, MD      . morphine 2 MG/ML injection 2 mg  2 mg Intravenous Q4H PRN Elwyn Reach, MD      . multivitamin with minerals tablet 1 tablet  1 tablet Oral Daily Elwyn Reach, MD   1 tablet at 06/04/19 0941  . naphazoline-pheniramine (NAPHCON-A) 0.025-0.3 % ophthalmic solution   Both Eyes PRN Lavina Hamman, MD      . ondansetron [facility]) tablet 4 mg  4 mg Oral Q6H PRN Elwyn Reach, MD       Or  . ondansetron (ZOFRAN) injection 4 mg  4 mg Intravenous Q6H PRN Elwyn Reach, MD      . pantoprazole (PROTONIX) EC tablet 40 mg  40 mg Oral Daily Lavina Hamman, MD   40 mg at 06/04/19 1316  . sodium chloride flush (NS) 0.9 % injection 3 mL  3 mL Intravenous Once Gala Romney L, MD      . tamsulosin (FLOMAX) capsule 0.4 mg  0.4 mg Oral Daily Lavina Hamman, MD   0.4 mg at 06/04/19 1316  . thiamine tablet 100 mg  100 mg Oral Daily Gala Romney L, MD   100 mg at 06/04/19 0941   Or  . thiamine (B-1) injection 100 mg  100 mg Intravenous Daily Elwyn Reach, MD        REVIEW OF SYSTEMS:  On review of systems, the patient reports that he is doing well overall. He denies any chest pain, shortness of breath, cough, fevers, chills, night sweats, or recent unintended weight changes. He denies any bowel or bladder disturbances, and denies abdominal pain,  nausea or vomiting. He denies any headache, N/V, dizziness, visual changes or difficulty with speech. He denies any new musculoskeletal or joint aches or pains but continues with numbness/tingling in the left upper extremity involving the fingers, hand and just above the wrist. He denies any current weakness in the left lower extremity and denies any associated back pain or injuries to the head/neck.  A complete review of systems is obtained and is otherwise negative.  PHYSICAL EXAM:  Wt Readings from Last 3 Encounters:  06/03/19 132 lb 8 oz (60.1 kg)  01/23/19 143 lb 12 oz (65.2 kg)  02/26/18 150 lb 4 oz (68.2 kg)   Temp Readings from Last 3 Encounters:  06/04/19 97.6 F (36.4 C) (Oral)  01/23/19 98.2 F (36.8 C) (Temporal)  02/26/18 98.2 F (36.8 C) (Oral)   BP Readings from Last 3 Encounters:  06/04/19 129/72  01/23/19 (!) 159/95  02/26/18 (!) 164/89   Pulse Readings from Last 3 Encounters:  06/04/19 87  01/23/19 90  02/26/18 87   Pain Assessment Pain Score: 0-No pain/10  Unable to assess due to telephone consult visit format.  KPS = 90  100 - Normal; no complaints; no evidence of disease. 90   - Able to carry on normal activity; minor signs or symptoms of disease. 80   - Normal activity with effort; some signs or symptoms of disease. 70   - Cares for self; unable to carry on normal activity or to do active work. 60   - Requires occasional assistance, but is able to care for most of his personal needs. 50   - Requires considerable assistance and frequent medical care. 37   - Disabled; requires special care and assistance. 2   - Severely disabled; hospital admission is indicated although death not imminent. 20   -  Very sick; hospital admission necessary; active supportive treatment necessary. 10   - Moribund; fatal processes progressing rapidly. 0     - Dead  Karnofsky DA, Abelmann Walton, Craver LS and Burchenal [facility] [phone]) The use of the nitrogen mustards in the palliative  treatment of carcinoma: with particular reference to bronchogenic carcinoma Cancer 1 [id number]  LABORATORY DATA:  Lab Results  Component Value Date   WBC 10.8 (H) 06/04/2019   HGB 15.7 06/04/2019   HCT 44.7 06/04/2019   MCV 97.8 06/04/2019   PLT 287 06/04/2019   Lab Results  Component Value Date   NA 138 06/04/2019   K 4.2 06/04/2019   CL 102 06/04/2019   CO2 25 06/04/2019   Lab Results  Component Value Date   ALT 13 06/04/2019   AST 24 06/04/2019   ALKPHOS 72 06/04/2019   BILITOT 0.8 06/04/2019     RADIOGRAPHY: CT HEAD WO CONTRAST  Result Date: 06/03/2019 CLINICAL DATA:  Left arm weakness over the last week. EXAM: CT HEAD WITHOUT CONTRAST TECHNIQUE: Contiguous axial images were obtained from the base of the skull through the vertex without intravenous contrast. COMPARISON:  None. FINDINGS: Brain: No abnormality is seen affecting the brainstem or cerebellum. Left cerebral hemisphere shows subcortical white matter edema in the left posterior parietal region and at the left frontal vertex. Right cerebral hemisphere shows a large region of vasogenic edema within the right frontoparietal white matter with intact overlying cortex. Probable cortical thickening. These findings are not typical of ordinary infarctions and raise the possibility of either metastatic disease, multifocal primary brain malignancy or lymphoma. Cerebritis could also be possible. Recommend MRI with and without contrast when able. No hydrocephalus. No midline shift. No extra-axial fluid collection. Vascular: There is atherosclerotic calcification of the major vessels at the base of the brain. Skull: Negative Sinuses/Orbits: Clear/normal Other: None IMPRESSION: Pronounced abnormality of the right frontoparietal region with extensive vasogenic white matter edema and probable abnormal cortical thickening and hyperdensity. Smaller area of vasogenic edema within the left posterior parietal subcortical white matter and the left  frontal subcortical white matter. Most likely diagnosis is occult metastatic disease. Multifocal primary brain malignancy and lymphoma are also possible. Lastly, septic emboli/cerebritis are considered. Recommend brain MRI with and without contrast. Electronically Signed   By: Nelson Chimes M.D.   On: 06/03/2019 13:24   CT Chest W Contrast  Result Date: 06/03/2019 CLINICAL DATA:  Brain MR demonstrating metastatic disease. History of melanoma 4 years ago. EXAM: CT CHEST, ABDOMEN, AND PELVIS WITH CONTRAST TECHNIQUE: Multidetector CT imaging of the chest, abdomen and pelvis was performed following the standard protocol during bolus administration of intravenous contrast. CONTRAST:  127mL OMNIPAQUE IOHEXOL 300 MG/ML  SOLN COMPARISON:  Chest radiograph report 04/29/99 brain MR of earlier today. FINDINGS: CT CHEST FINDINGS Cardiovascular: Mild motion degradation involving the upper and mid chest. Aortic and branch vessel atherosclerosis. Tortuous thoracic aorta. Normal heart size, without pericardial effusion. No central pulmonary embolism, on this non-dedicated study. Mediastinum/Nodes: Extensive mediastinal adenopathy. Right paratracheal node measures 1.6 cm on 18/3. Subcarinal node measures 1.5 cm on 29/3. Right hilar adenopathy at 1.6 cm on 30/3. Prevascular node of 1.0 cm on 22/3. Lungs/Pleura: No pleural fluid. Bronchial wall thickening is most significant in the right middle and right lower lobes, including on 82/5. Centrilobular emphysema. Right middle lobe 5 mm pulmonary nodule on 96/5. Right lower lobe 1.0 cm nodule including on 84/5. Likely calcified 2 mm posterior right upper lobe pulmonary nodule on 63/5. Musculoskeletal:  No acute osseous abnormality. CT ABDOMEN PELVIS FINDINGS Hepatobiliary: Vague hypoattenuating liver lesions bilaterally. Example in the right hepatic lobe at 8 mm on 54/3 and within the lateral segment left liver lobe 1.1 cm on 57/3. Normal gallbladder, without biliary ductal dilatation.  Pancreas: Normal, without mass or ductal dilatation. Spleen: Normal in size, without focal abnormality. Adrenals/Urinary Tract: Normal adrenal glands. Normal left kidney. Too small to characterize interpolar right renal lesion. No hydronephrosis. Bladder wall irregularity with small saccules posteriorly. Stomach/Bowel: Proximal gastric underdistention. Scattered colonic diverticula. Normal terminal ileum. Normal small bowel. Vascular/Lymphatic: Advanced aortic and branch vessel atherosclerosis. Porta hepatis adenopathy, including a nodal mass of 2.6 x 3.1 cm on 66/3. No pelvic sidewall adenopathy. Reproductive: Mild prostatomegaly. Other: No significant free fluid. No evidence of omental or peritoneal disease. Musculoskeletal: Thoracolumbar spondylosis. S-shaped thoracolumbar spine curvature. IMPRESSION: 1. Thoracoabdominal adenopathy and liver lesions, favored to represent metastatic disease. Given emphysema and small pulmonary nodules, differential considerations include metastatic bronchogenic carcinoma and melanoma. 2. Motion degraded evaluation of the upper chest. 3. Aortic atherosclerosis (ICD10-I70.0) and emphysema (ICD10-J43.9). 4. Prostatomegaly with a component of bladder outlet obstruction. Electronically Signed   By: Abigail Miyamoto M.D.   On: 06/03/2019 20:02   MR Brain W and Wo Contrast  Result Date: 06/03/2019 CLINICAL DATA:  Left arm weakness.  Abnormal CT head EXAM: MRI HEAD WITHOUT AND WITH CONTRAST TECHNIQUE: Multiplanar, multiecho pulse sequences of the brain and surrounding structures were obtained without and with intravenous contrast. CONTRAST:  71mL GADAVIST GADOBUTROL 1 MMOL/ML IV SOLN COMPARISON:  CT head 06/03/2019 FINDINGS: Brain: Multiple enhancing mass lesions are present in the brain. Largest in the right posterior frontal lobe shows homogeneous enhancement measuring 35 x 27 mm. The mass appears to infiltrate and thicken the cortex with extensive adjacent vasogenic edema. This  corresponds to the primary CT abnormality Additional smaller enhancing mass lesions are present. 6 mm enhancing lesion right frontal lobe. 11 mm enhancing lesion left frontal lobe with mild edema. 9 mm enhancing lesion left frontal lobe. 3 mm enhancing lesion left temporoparietal periventricular white matter. 12 mm enhancing lesion left posterior parietal lobe with edema. These lesions show significant restricted diffusion. No associated hemorrhage Ventricle size normal.  No midline shift. Vascular: Normal arterial flow voids. Skull and upper cervical spine: No focal skeletal lesion. Sinuses/Orbits: Mild mucosal edema paranasal sinuses.  Normal orbit Other: None IMPRESSION: Multiple enhancing mass lesions in the brain with surrounding edema compatible with metastatic disease. Patient has history of melanoma. Electronically Signed   By: Franchot Gallo M.D.   On: 06/03/2019 18:13   CT ABDOMEN PELVIS W CONTRAST  Result Date: 06/03/2019 CLINICAL DATA:  Brain MR demonstrating metastatic disease. History of melanoma 4 years ago. EXAM: CT CHEST, ABDOMEN, AND PELVIS WITH CONTRAST TECHNIQUE: Multidetector CT imaging of the chest, abdomen and pelvis was performed following the standard protocol during bolus administration of intravenous contrast. CONTRAST:  168mL OMNIPAQUE IOHEXOL 300 MG/ML  SOLN COMPARISON:  Chest radiograph report 04/29/99 brain MR of earlier today. FINDINGS: CT CHEST FINDINGS Cardiovascular: Mild motion degradation involving the upper and mid chest. Aortic and branch vessel atherosclerosis. Tortuous thoracic aorta. Normal heart size, without pericardial effusion. No central pulmonary embolism, on this non-dedicated study. Mediastinum/Nodes: Extensive mediastinal adenopathy. Right paratracheal node measures 1.6 cm on 18/3. Subcarinal node measures 1.5 cm on 29/3. Right hilar adenopathy at 1.6 cm on 30/3. Prevascular node of 1.0 cm on 22/3. Lungs/Pleura: No pleural fluid. Bronchial wall thickening is  most significant in the right  middle and right lower lobes, including on 82/5. Centrilobular emphysema. Right middle lobe 5 mm pulmonary nodule on 96/5. Right lower lobe 1.0 cm nodule including on 84/5. Likely calcified 2 mm posterior right upper lobe pulmonary nodule on 63/5. Musculoskeletal: No acute osseous abnormality. CT ABDOMEN PELVIS FINDINGS Hepatobiliary: Vague hypoattenuating liver lesions bilaterally. Example in the right hepatic lobe at 8 mm on 54/3 and within the lateral segment left liver lobe 1.1 cm on 57/3. Normal gallbladder, without biliary ductal dilatation. Pancreas: Normal, without mass or ductal dilatation. Spleen: Normal in size, without focal abnormality. Adrenals/Urinary Tract: Normal adrenal glands. Normal left kidney. Too small to characterize interpolar right renal lesion. No hydronephrosis. Bladder wall irregularity with small saccules posteriorly. Stomach/Bowel: Proximal gastric underdistention. Scattered colonic diverticula. Normal terminal ileum. Normal small bowel. Vascular/Lymphatic: Advanced aortic and branch vessel atherosclerosis. Porta hepatis adenopathy, including a nodal mass of 2.6 x 3.1 cm on 66/3. No pelvic sidewall adenopathy. Reproductive: Mild prostatomegaly. Other: No significant free fluid. No evidence of omental or peritoneal disease. Musculoskeletal: Thoracolumbar spondylosis. S-shaped thoracolumbar spine curvature. IMPRESSION: 1. Thoracoabdominal adenopathy and liver lesions, favored to represent metastatic disease. Given emphysema and small pulmonary nodules, differential considerations include metastatic bronchogenic carcinoma and melanoma. 2. Motion degraded evaluation of the upper chest. 3. Aortic atherosclerosis (ICD10-I70.0) and emphysema (ICD10-J43.9). 4. Prostatomegaly with a component of bladder outlet obstruction. Electronically Signed   By: Abigail Miyamoto M.D.   On: 06/03/2019 20:02      IMPRESSION/PLAN:  This visit was conducted via telephone to spare  the patient unnecessary potential exposure in the healthcare setting during the current COVID-19 pandemic.  44. 68 y.o. male with left arm weakness secondary to newly diagnosed metastatic brain disease from unknown primary, pending tissue confirmation with bronchoscopy scheduled for 06/05/19.  His case and imaging have been reviewed with Dr. Tammi Klippel who participated in the formulation of this plan. Today, I reviewed the findings and workup thus far with the patient and his wife by phone. At this point, the patient would potentially benefit from radiotherapy. The options include whole brain irradiation versus stereotactic radiosurgery and the appropriate treatment will be based on fianl pathology from upcoming bronchoscopy procedure 06/05/19. There are pros and cons associated with each of these potential treatment options. Whole brain radiotherapy would treat the known metastatic deposits and help provide some reduction of risk for future brain metastases. However, whole brain radiotherapy carries potential risks including hair loss, subacute somnolence, and neurocognitive changes including a possible reduction in short-term memory. Whole brain radiotherapy also may carry a lower likelihood of tumor control at the treatment sites because of the low-dose used. Stereotactic radiosurgery carries a higher likelihood for local tumor control at the targeted sites with lower associated risk for neurocognitive changes such as memory loss. However, the use of stereotactic radiosurgery in this setting may leave the patient at increased risk for new brain metastases elsewhere in the brain as high as 50-60%. Accordingly, patients who receive stereotactic radiosurgery in this setting should undergo ongoing surveillance imaging with brain MRI more frequently in order to identify and treat new small brain metastases before they become symptomatic. Stereotactic radiosurgery does carry some different risks, including a risk of  radionecrosis.  We discussed the dilemma regarding whole brain radiotherapy versus stereotactic radiosurgery. We discussed the pros and cons of each. We also briefly discussed the logistics and delivery of each. The patient and his wife understand that the formal treatment recommendation will be determined based on final pathology from his upcoming  bronchoscopy procedure.  If this proves to be small cell lung cancer, our recommendation would be to proceed with whole brain radiotherapy.  If this proves to be a non-small cell lung cancer, we could potentially consider stereotactic radiosurgery to the smaller lesions in the right and left frontal lobes and preoperative stereotactic radiosurgery to the larger lesion in the right posterior frontal lobe.  We will reach out to neurosurgery to get their input on treatment recommendations as well.  He understands that from a radiation standpoint, there is no reason that he would have to remain inpatient as we can make arrangements for radiotherapy on an outpatient basis.  His discharge will be at the discretion of his hospital medical team as to when they feel he is medically stable to safely discharge home.  We would recommend that he remain on Decadron 4mg  po TID at discharge with plans to taper him off of this once he has completed brain radiation. We will remain in communication and collaboration with his medical team with plans to reach back out for further discussion and treatment planning once we have tissue confirmation.  In a visit lasting 60 minutes, greater than 50% of that time was spent in telephone consult, discussing his case and coordinating his care.  Given current concerns for patient exposure during the COVID-19 pandemic, this encounter was conducted via telephone. The patient has given verbal consent for this type of encounter.    Nicholos Johns, PA-C    Tyler Pita, MD  [facility] Direct Dial: [phone]   Fax: [phone] [email or web address]  Skype  [email or web address]  This document serves as a record of services personally performed by Tyler Pita, MD and Freeman Caldron, PA-C. It was created on their behalf by Wilburn Mylar, a trained medical scribe. The creation of this record is based on the scribe's personal observations and the provider's statements to them. This document has been checked and approved by the attending provider.

## 2019-06-05 ENCOUNTER — Inpatient Hospital Stay (HOSPITAL_COMMUNITY): Payer: Medicare Other | Admitting: Anesthesiology

## 2019-06-05 ENCOUNTER — Encounter: Payer: Self-pay | Admitting: *Deleted

## 2019-06-05 ENCOUNTER — Institutional Professional Consult (permissible substitution): Payer: Medicare Other | Admitting: Radiation Oncology

## 2019-06-05 ENCOUNTER — Other Ambulatory Visit: Payer: Self-pay | Admitting: Hematology & Oncology

## 2019-06-05 ENCOUNTER — Encounter (HOSPITAL_COMMUNITY): Payer: Self-pay | Admitting: Internal Medicine

## 2019-06-05 ENCOUNTER — Encounter (HOSPITAL_COMMUNITY)
Admission: EM | Disposition: A | Discharge status: Home Health | Payer: Self-pay | Source: Home / Self Care | Attending: Internal Medicine

## 2019-06-05 DIAGNOSIS — C3491 Malignant neoplasm of unspecified part of right bronchus or lung: Secondary | ICD-10-CM

## 2019-06-05 HISTORY — PX: BRONCHIAL NEEDLE ASPIRATION BIOPSY: SHX5106

## 2019-06-05 HISTORY — PX: VIDEO BRONCHOSCOPY WITH ENDOBRONCHIAL ULTRASOUND: SHX6177

## 2019-06-05 LAB — CBC WITH DIFFERENTIAL/PLATELET
Abs Immature Granulocytes: 0.18 10*3/uL — ABNORMAL HIGH (ref 0.00–0.07)
Basophils Absolute: 0 10*3/uL (ref 0.0–0.1)
Basophils Relative: 0 %
Eosinophils Absolute: 0 10*3/uL (ref 0.0–0.5)
Eosinophils Relative: 0 %
HCT: 42.5 % (ref 39.0–52.0)
Hemoglobin: 15.2 g/dL (ref 13.0–17.0)
Immature Granulocytes: 1 %
Lymphocytes Relative: 7 %
Lymphs Abs: 1.4 10*3/uL (ref 0.7–4.0)
MCH: 34.2 pg — ABNORMAL HIGH (ref 26.0–34.0)
MCHC: 35.8 g/dL (ref 30.0–36.0)
MCV: 95.7 fL (ref 80.0–100.0)
Monocytes Absolute: 1.8 10*3/uL — ABNORMAL HIGH (ref 0.1–1.0)
Monocytes Relative: 9 %
Neutro Abs: 15.7 10*3/uL — ABNORMAL HIGH (ref 1.7–7.7)
Neutrophils Relative %: 83 %
Platelets: 282 10*3/uL (ref 150–400)
RBC: 4.44 MIL/uL (ref 4.22–5.81)
RDW: 11.3 % — ABNORMAL LOW (ref 11.5–15.5)
WBC: 19 10*3/uL — ABNORMAL HIGH (ref 4.0–10.5)
nRBC: 0 % (ref 0.0–0.2)

## 2019-06-05 LAB — SURGICAL PCR SCREEN
MRSA, PCR: POSITIVE — AB
Staphylococcus aureus: POSITIVE — AB

## 2019-06-05 LAB — COMPREHENSIVE METABOLIC PANEL
ALT: 14 U/L (ref 0–44)
AST: 23 U/L (ref 15–41)
Albumin: 3.4 g/dL — ABNORMAL LOW (ref 3.5–5.0)
Alkaline Phosphatase: 63 U/L (ref 38–126)
Anion gap: 11 (ref 5–15)
BUN: 10 mg/dL (ref 8–23)
CO2: 24 mmol/L (ref 22–32)
Calcium: 9.3 mg/dL (ref 8.9–10.3)
Chloride: 101 mmol/L (ref 98–111)
Creatinine, Ser: 0.6 mg/dL — ABNORMAL LOW (ref 0.61–1.24)
GFR calc Af Amer: >60 mL/min (ref >60)
GFR calc non Af Amer: >60 mL/min (ref >60)
Glucose, Bld: 138 mg/dL — ABNORMAL HIGH (ref 70–99)
Potassium: 3.8 mmol/L (ref 3.5–5.1)
Sodium: 136 mmol/L (ref 135–145)
Total Bilirubin: 0.6 mg/dL (ref 0.3–1.2)
Total Protein: 6.4 g/dL — ABNORMAL LOW (ref 6.5–8.1)

## 2019-06-05 LAB — PROTIME-INR
INR: 1 (ref 0.8–1.2)
Prothrombin Time: 12.7 seconds (ref 11.4–15.2)

## 2019-06-05 LAB — LACTATE DEHYDROGENASE: LDH: 147 U/L (ref 98–192)

## 2019-06-05 SURGERY — BRONCHOSCOPY, WITH EBUS
Anesthesia: General

## 2019-06-05 MED ORDER — PHENYLEPHRINE HCL-NACL 10-0.9 MG/250ML-% IV SOLN
INTRAVENOUS | Status: DC | PRN
  Administered 2019-06-05: 20 ug/min via INTRAVENOUS

## 2019-06-05 MED ORDER — ZOLPIDEM TARTRATE 5 MG PO TABS
5.0000 mg | ORAL_TABLET | Freq: Once | ORAL | Status: AC
  Administered 2019-06-05: 5 mg via ORAL
  Filled 2019-06-05: qty 1

## 2019-06-05 MED ORDER — MIDAZOLAM HCL 2 MG/2ML IJ SOLN
INTRAMUSCULAR | Status: AC
  Filled 2019-06-05: qty 2

## 2019-06-05 MED ORDER — MEPERIDINE HCL 25 MG/ML IJ SOLN: 6.2500 mg | INTRAMUSCULAR | Status: DC | PRN

## 2019-06-05 MED ORDER — MIDAZOLAM HCL 5 MG/5ML IJ SOLN
INTRAMUSCULAR | Status: DC | PRN
  Administered 2019-06-05: 1 mg via INTRAVENOUS

## 2019-06-05 MED ORDER — ROCURONIUM BROMIDE 10 MG/ML (PF) SYRINGE
PREFILLED_SYRINGE | INTRAVENOUS | Status: AC
  Filled 2019-06-05: qty 20

## 2019-06-05 MED ORDER — PROPOFOL 10 MG/ML IV BOLUS
INTRAVENOUS | Status: AC
  Filled 2019-06-05: qty 20

## 2019-06-05 MED ORDER — LIDOCAINE 2% (20 MG/ML) 5 ML SYRINGE
INTRAMUSCULAR | Status: DC | PRN
  Administered 2019-06-05: 50 mg via INTRAVENOUS

## 2019-06-05 MED ORDER — PROPOFOL 10 MG/ML IV BOLUS
INTRAVENOUS | Status: DC | PRN
  Administered 2019-06-05: 100 mg via INTRAVENOUS

## 2019-06-05 MED ORDER — FLUCONAZOLE 100 MG PO TABS: 100.0000 mg | ORAL_TABLET | Freq: Every day | ORAL | 0 refills | Status: DC

## 2019-06-05 MED ORDER — DEXAMETHASONE 6 MG PO TABS: 12.0000 mg | ORAL_TABLET | Freq: Two times a day (BID) | ORAL | 0 refills | Status: DC

## 2019-06-05 MED ORDER — ROCURONIUM BROMIDE 10 MG/ML (PF) SYRINGE
PREFILLED_SYRINGE | INTRAVENOUS | Status: DC | PRN
  Administered 2019-06-05: 50 mg via INTRAVENOUS

## 2019-06-05 MED ORDER — PROMETHAZINE HCL 25 MG/ML IJ SOLN: 6.2500 mg | INTRAMUSCULAR | Status: DC | PRN

## 2019-06-05 MED ORDER — DEXAMETHASONE SODIUM PHOSPHATE 10 MG/ML IJ SOLN
INTRAMUSCULAR | Status: AC
  Filled 2019-06-05: qty 2

## 2019-06-05 MED ORDER — FENTANYL CITRATE (PF) 250 MCG/5ML IJ SOLN
INTRAMUSCULAR | Status: AC
  Filled 2019-06-05: qty 5

## 2019-06-05 MED ORDER — DEXAMETHASONE SODIUM PHOSPHATE 10 MG/ML IJ SOLN
INTRAMUSCULAR | Status: DC | PRN
  Administered 2019-06-05: 10 mg via INTRAVENOUS

## 2019-06-05 MED ORDER — DEXAMETHASONE 4 MG PO TABS: 12.0000 mg | ORAL_TABLET | Freq: Two times a day (BID) | ORAL | Status: DC

## 2019-06-05 MED ORDER — ONDANSETRON HCL 4 MG/2ML IJ SOLN
INTRAMUSCULAR | Status: DC | PRN
  Administered 2019-06-05: 4 mg via INTRAVENOUS

## 2019-06-05 MED ORDER — FENTANYL CITRATE (PF) 100 MCG/2ML IJ SOLN
INTRAMUSCULAR | Status: DC | PRN
  Administered 2019-06-05: 25 ug via INTRAVENOUS
  Administered 2019-06-05: 50 ug via INTRAVENOUS

## 2019-06-05 MED ORDER — CEFAZOLIN SODIUM-DEXTROSE 2-4 GM/100ML-% IV SOLN
2.0000 g | INTRAVENOUS | Status: DC
  Filled 2019-06-05 (×2): qty 100

## 2019-06-05 MED ORDER — LACTATED RINGERS IV SOLN: INTRAVENOUS | Status: DC

## 2019-06-05 MED ORDER — ONDANSETRON HCL 4 MG/2ML IJ SOLN
INTRAMUSCULAR | Status: AC
  Filled 2019-06-05: qty 4

## 2019-06-05 MED ORDER — FENTANYL CITRATE (PF) 100 MCG/2ML IJ SOLN: 25.0000 ug | INTRAMUSCULAR | Status: DC | PRN

## 2019-06-05 MED ORDER — FLUCONAZOLE 100 MG PO TABS: 100.0000 mg | ORAL_TABLET | Freq: Every day | ORAL | Status: DC

## 2019-06-05 MED ORDER — PHENYLEPHRINE 40 MCG/ML (10ML) SYRINGE FOR IV PUSH (FOR BLOOD PRESSURE SUPPORT)
PREFILLED_SYRINGE | INTRAVENOUS | Status: DC | PRN
  Administered 2019-06-05 (×2): 80 ug via INTRAVENOUS

## 2019-06-05 MED ORDER — ONDANSETRON HCL 4 MG PO TABS: 4.0000 mg | ORAL_TABLET | Freq: Four times a day (QID) | ORAL | 0 refills | Status: AC | PRN

## 2019-06-05 MED ORDER — SUGAMMADEX SODIUM 200 MG/2ML IV SOLN
INTRAVENOUS | Status: DC | PRN
  Administered 2019-06-05: 120 mg via INTRAVENOUS

## 2019-06-05 MED ORDER — MIDAZOLAM HCL 2 MG/2ML IJ SOLN: 0.5000 mg | Freq: Once | INTRAMUSCULAR | Status: DC | PRN

## 2019-06-05 MED ORDER — MUPIROCIN 2 % EX OINT
1.0000 "application " | TOPICAL_OINTMENT | Freq: Two times a day (BID) | CUTANEOUS | Status: DC
  Administered 2019-06-05: 1 via NASAL
  Filled 2019-06-05: qty 22

## 2019-06-05 SURGICAL SUPPLY — 32 items
ADAPTER VALVE BIOPSY EBUS (MISCELLANEOUS) IMPLANT
ADPTR VALVE BIOPSY EBUS (MISCELLANEOUS)
BRUSH CYTOL CELLEBRITY 1.5X140 (MISCELLANEOUS) IMPLANT
CANISTER SUCT 3000ML PPV (MISCELLANEOUS) ×4 IMPLANT
CONT SPEC 4OZ CLIKSEAL STRL BL (MISCELLANEOUS) ×4 IMPLANT
COVER BACK TABLE 60X90IN (DRAPES) ×4 IMPLANT
FORCEPS BIOP RJ4 1.8 (CUTTING FORCEPS) IMPLANT
GAUZE SPONGE 4X4 12PLY STRL (GAUZE/BANDAGES/DRESSINGS) ×4 IMPLANT
GLOVE BIO SURGEON STRL SZ7.5 (GLOVE) ×4 IMPLANT
GOWN STRL REUS W/ TWL LRG LVL3 (GOWN DISPOSABLE) ×2 IMPLANT
GOWN STRL REUS W/TWL LRG LVL3 (GOWN DISPOSABLE) ×2
KIT CLEAN ENDO COMPLIANCE (KITS) ×8 IMPLANT
KIT TURNOVER KIT B (KITS) ×4 IMPLANT
MARKER SKIN DUAL TIP RULER LAB (MISCELLANEOUS) ×4 IMPLANT
NEEDLE ASPIRATION VIZISHOT 19G (NEEDLE) IMPLANT
NEEDLE ASPIRATION VIZISHOT 21G (NEEDLE) ×4 IMPLANT
NS IRRIG 1000ML POUR BTL (IV SOLUTION) ×4 IMPLANT
OIL SILICONE PENTAX (PARTS (SERVICE/REPAIRS)) ×4 IMPLANT
PAD ARMBOARD 7.5X6 YLW CONV (MISCELLANEOUS) ×8 IMPLANT
SYR 20ML ECCENTRIC (SYRINGE) ×8 IMPLANT
SYR 20ML LL LF (SYRINGE) ×8 IMPLANT
SYR 50ML SLIP (SYRINGE) IMPLANT
SYR 5ML LUER SLIP (SYRINGE) ×4 IMPLANT
TOWEL GREEN STERILE FF (TOWEL DISPOSABLE) ×4 IMPLANT
TRAP SPECIMEN MUCOUS 40CC (MISCELLANEOUS) IMPLANT
TUBE CONNECTING 20'X1/4 (TUBING) ×2
TUBE CONNECTING 20X1/4 (TUBING) ×6 IMPLANT
UNDERPAD 30X30 (UNDERPADS AND DIAPERS) ×4 IMPLANT
VALVE BIOPSY  SINGLE USE (MISCELLANEOUS) ×2
VALVE BIOPSY SINGLE USE (MISCELLANEOUS) ×2 IMPLANT
VALVE SUCTION BRONCHIO DISP (MISCELLANEOUS) ×4 IMPLANT
WATER STERILE IRR 1000ML POUR (IV SOLUTION) ×4 IMPLANT

## 2019-06-05 NOTE — Evaluation (Signed)
Physical Therapy Evaluation Patient Details Name: Walter Hernandez MRN: [id number] DOB: November 07, 1950 Today's Date: 06/05/2019   History of Present Illness  Walter Hernandez is a 68 y.o. M admitted for brain metastases. PMH includes malignant melanoma over L leg, HTN, and GERD. He is scheduled to undergo a bronchoscopy 12/30. He has L sided UE and LE weakness.  Clinical Impression  Pt admitted for above. Pt pleasant throughout. Pt with deficits in balance, gait, strength, sensation, coordination, and overall functional mobility. He presents with L sided UE and LE weakness as described below, with the UE being much more involved than the LE. He required supervision for bed mobility and min guard for gait and transfers. He demonstrates impulsivity with movement and decreased safety awareness and needed frequent cueing for safe gait speed, staying within walker during gait, making sure to bring walker with him as he turns, and hand placement on stairs. Spoke with his wife, Colletta [place], on the phone to review our session. She verbalizes understanding and states that she feels comfortable with helping him mobilize safely at home given his presentation today. Gait belt adjusted and distributed to patient. Recommend RW for safety with mobility at home. Also recommend HH PT and OT. Pt and his wife noted that they were told by the physician that home therapy would be optimized by starting once he begins chemotherapy, and SPT is in agreement. Pt would benefit from continued skilled PT intervention in order to address deficits.     Follow Up Recommendations Home health PT;Supervision for mobility/OOB; [facility] OT    Equipment Recommendations  Rolling walker with 5" wheels    Recommendations for Other Services       Precautions / Restrictions Precautions Precautions: Fall Restrictions Weight Bearing Restrictions: No      Mobility  Bed Mobility Overal bed mobility: Needs Assistance Bed Mobility: Supine to Sit     Supine  to sit: Supervision     General bed mobility comments: some ataxic movement; limited use of L UE; no need for physical assistance but supervision for safety; use of bed rails  Transfers Overall transfer level: Needs assistance Equipment used: Rolling walker (2 wheeled) Transfers: Sit to/from Stand Sit to Stand: min guard         General transfer comment: min guard for safety due to impulsivity; no physical assistance required; pt pushed up from bed with R UE with no LOB or unsteadiness, but did so impulsively before SPT was ready to assist; catheter was still attached to other side of the bed and pt did not demonstrate awareness of this as he stood up  Ambulation/Gait Ambulation/Gait assistance: Min guard Gait Distance (Feet): 250 Feet Assistive device: Rolling walker (2 wheeled) Gait Pattern/deviations: Step-through pattern;Decreased step length - left;Trunk flexed;Wide base of support;Ataxic Gait velocity: slightly decreased (cued to decrease speed for safety)   General Gait Details: pt with some ataxic gait particularly with L LE; cues for posture and safety with obstacles; also needed cues for safe gait speed as he tends to move very quickly; some mild unsteadiness aided by use of RW; pt impulsive with movement; required cueing to not leave walker behind and to stay within walker  Stairs Stairs: Yes Stairs assistance: Min guard Stair Management: One rail Right;Sideways;Forwards Number of Stairs: 12 General stair comments: pt impulsive with stair navigation; needed constant cueing for safety and hand placement on railing given L UE weakness; went up forwards with R rail, down sideways facing R rail to mimic set up at home; min  guard for safety and cues  Wheelchair Mobility    Modified Rankin (Stroke Patients Only)       Balance Overall balance assessment: Mild deficits observed, not formally tested                                           Pertinent  Vitals/Pain Pain Assessment: No/denies pain    Home Living Family/patient expects to be discharged to:: Private residence Living Arrangements: Spouse/significant other Available Help at Discharge: Family Type of Home: House Home Access: Stairs to enter Entrance Stairs-Rails: Right Entrance Stairs-Number of Steps: 2 Home Layout: Two level Home Equipment: [place] - 4 wheels;Cane - single point;Bedside commode;Grab bars - toilet;Grab bars - tub/shower      Prior Function Level of Independence: Independent               Hand Dominance   Dominant Hand: Right    Extremity/Trunk Assessment   Upper Extremity Assessment Upper Extremity Assessment: LUE deficits/detail LUE Deficits / Details: decreased sensation over middle and little fingers; decreased grip strength (but enough to grip walker), MMT grossly 2+/5 LUE Sensation: decreased light touch LUE Coordination: decreased gross motor;decreased fine motor    Lower Extremity Assessment Lower Extremity Assessment: LLE deficits/detail LLE Deficits / Details: slightly decreased gross motor coordination during gait; hip flexion 3+/5, knee flexion and extension 4/5, ankle dorsiflexion 5/5 LLE Sensation: WNL LLE Coordination: decreased gross motor       Communication   Communication: No difficulties  Cognition Arousal/Alertness: Awake/alert Behavior During Therapy: WFL for tasks assessed/performed Overall Cognitive Status: Within Functional Limits for tasks assessed                                        General Comments      Exercises     Assessment/Plan    PT Assessment Patient needs continued PT services  PT Problem List Decreased strength;Decreased activity tolerance;Decreased balance;Decreased mobility;Decreased coordination;Decreased knowledge of use of DME;Decreased safety awareness;Decreased knowledge of precautions;Impaired sensation       PT Treatment Interventions Gait training;DME  instruction;Stair training;Functional mobility training;Therapeutic activities;Therapeutic exercise;Balance training;Neuromuscular re-education;Patient/family education;Modalities    PT Goals (Current goals can be found in the Care Plan section)  Acute Rehab PT Goals Patient Stated Goal: go home PT Goal Formulation: With patient Time For Goal Achievement: 06/19/19 Potential to Achieve Goals: Good    Frequency Min 3X/week   Barriers to discharge        Co-evaluation               AM-PAC PT "6 Clicks" Mobility  Outcome Measure Help needed turning from your back to your side while in a flat bed without using bedrails?: A Little Help needed moving from lying on your back to sitting on the side of a flat bed without using bedrails?: A Little Help needed moving to and from a bed to a chair (including a wheelchair)?: A Little Help needed standing up from a chair using your arms (e.g., wheelchair or bedside chair)?: A Little Help needed to walk in hospital room?: A Little Help needed climbing 3-5 steps with a railing? : A Little 6 Click Score: 18    End of Session Equipment Utilized During Treatment: Gait belt Activity Tolerance: Patient tolerated treatment well Patient left: in chair;with  call bell/phone within reach Nurse Communication: Mobility status PT Visit Diagnosis: Unsteadiness on feet (R26.81);Other abnormalities of gait and mobility (R26.89);Muscle weakness (generalized) (M62.81);Ataxic gait (R26.0);Other symptoms and signs involving the nervous system (V67.209)    Time: 1980-2217 PT Time Calculation (min) (ACUTE ONLY): 41 min   Charges:   PT Evaluation $PT Eval Moderate Complexity: 1 Mod PT Treatments $Gait Training: 8-22 mins $Self Care/Home Management: 8-22       Christel Mormon, SPT   Makaylee Spielberg 06/05/2019, 10:10 AM

## 2019-06-05 NOTE — Anesthesia Procedure Notes (Signed)
Procedure Name: Intubation Date/Time: 06/05/2019 10:26 AM Performed by: Imagene Riches, CRNA Pre-anesthesia Checklist: Patient identified, Emergency Drugs available, Suction available and Patient being monitored Patient Re-evaluated:Patient Re-evaluated prior to induction Oxygen Delivery Method: Circle System Utilized Preoxygenation: Pre-oxygenation with 100% oxygen Induction Type: IV induction Ventilation: Mask ventilation without difficulty Laryngoscope Size: Miller and 2 Grade View: Grade I Tube type: Oral Tube size: 8.5 mm Number of attempts: 1 Airway Equipment and Method: Stylet and Oral airway Placement Confirmation: ETT inserted through vocal cords under direct vision,  positive ETCO2 and breath sounds checked- equal and bilateral Secured at: 22 cm Tube secured with: Tape Dental Injury: Teeth and Oropharynx as per pre-operative assessment

## 2019-06-05 NOTE — Progress Notes (Signed)
Full progress note to follow.  No changes overnight. Good to go for procedure today.  See details in consult note yesterday.  Erskine Emery MD

## 2019-06-05 NOTE — Progress Notes (Signed)
Pre-op report called and given to Manuela Schwartz, RN in Short Stay. All questions answered to satisfaction. OR personnel to transport pt.

## 2019-06-05 NOTE — Anesthesia Postprocedure Evaluation (Signed)
Anesthesia Post Note  Patient: Linken Mcglothen  Procedure(s) Performed: VIDEO BRONCHOSCOPY WITH ENDOBRONCHIAL ULTRASOUND (N/A ) Bronchial Needle Aspiration Biopsies     Patient location during evaluation: PACU Anesthesia Type: General Level of consciousness: awake and alert, patient cooperative and oriented Pain management: pain level controlled Vital Signs Assessment: post-procedure vital signs reviewed and stable Respiratory status: spontaneous breathing, nonlabored ventilation, respiratory function stable and patient connected to nasal cannula oxygen Cardiovascular status: blood pressure returned to baseline and stable Postop Assessment: no apparent nausea or vomiting Anesthetic complications: no    Last Vitals:  Vitals:   06/05/19 1157 06/05/19 1158  BP: 99/69 115/70  Pulse: 68 68  Resp: (!) 22 (!) 21  Temp: 36.6 C   SpO2: 96% 93%    Last Pain:  Vitals:   06/05/19 1157  TempSrc:   PainSc: 0-No pain                 Laquincy Eastridge,E. Kalla Watson

## 2019-06-05 NOTE — Progress Notes (Signed)
Reached out to Walter Hernandez to introduce myself as the office RN Navigator and explain our new patient process. Reviewed the reason for their referral and scheduled their new patient appointment along with labs. Provided address and directions to the office including call back phone number. Reviewed with patient any concerns they may have or any possible barriers to attending their appointment.   Patient is also scheduled for PET scan. Patient given information on appointment, including time, date and location in addition to PET prep. Information also sent via My Chart since patient is still currently admitted and being given multiple pieces of information.   Informed patient about my role as a navigator and that I will meet with them prior to their New Patient appointment and more fully discuss what services I can provide. At this time patient has no further questions or needs.

## 2019-06-05 NOTE — Progress Notes (Signed)
OT Cancellation Note  Patient Details Name: Walter Hernandez MRN: [id number] DOB: 11-16-1950   Cancelled Treatment:    Reason Eval/Treat Not Completed: Patient at procedure or test/ unavailable(in bed going to bronchoscopy) Ot to check back later today after procedure prior to d/c due to L UE weakness and safety concerns demonstrated in PT evaluation.    Fleeta Emmer, OTR/L  Acute Rehabilitation Services Pager: [phone] Office: [phone] .  Jeri Modena 06/05/2019, 10:25 AM

## 2019-06-05 NOTE — Progress Notes (Signed)
Patient seen and examined with Nurse Practitioner.  Agree with their assessment and plan as written.  S: Seen in f/u for abnormal chest imaging. No events overnight. Breathing well. Eager to go home.   O: Blood pressure (!) 131/58, pulse 72, temperature 98 F (36.7 C), temperature source Oral, resp. rate 16, height 5\' 7"  (1.702 m), weight 60.1 kg, SpO2 97 %.  Elderly man in NAD Good insight Lungs clear [place] 2 Left arm weakness noted  A:  # Metastatic cancer with history of smoking and melanoma, needs tissue for diagnosis.  P:  - Decadron for brain mets - Bronch today - Okay for home after, will need close OP onc followup  06/05/2019 Erskine Emery MD

## 2019-06-05 NOTE — Progress Notes (Signed)
Looks like things are moving along pretty quickly.  He will have a bronchoscopy today.  Hopefully, we will get enough tissue for her to make a diagnosis.  He feels well.  He still has the weakness in the left arm.  He is on steroids.  He will need to be on steroids when he goes home.  He will also need to be on PPI and also adding antifungal so he does not get thrush.  I still think that we are dealing with bronchogenic carcinoma.  Hopefully this is small cell that we can treat with chemoimmunotherapy highly effectively.  He will need to have a Port-A-Cath placed regardless of the diagnosis.  We will get this set up as an outpatient.  We will also get a PET scan on him as an outpatient.  I think the steroids of certainly put him in a much better frame of mind.  His labs today show white cell count of 19,000.  Hemoglobin 15.2.  Platelet count 282,000.  His calcium is 9.3.  Albumin is 3.4.  Liver function studies are normal.  There really is no change with his overall examination.  His temperature is 98.  Pulse 72.  Blood pressure 131/58.  He still has the weakness in the left arm.  His left leg seems to be maybe a little bit better.  His oral exam shows no thrush.  He has good breath sounds bilaterally.  Cardiac exam regular rate and rhythm.  Abdomen is soft.  Again, Mr. Walter Hernandez has metastatic disease.  Has a past history of a stage Ib superficial spreading melanoma of the left lower leg.  He is a smoker.  I still have to believe that we are looking at metastatic bronchogenic carcinoma.  We will follow up as an outpatient.  He really wants to go home today.  I think he probably could go home today from my point of view.  I appreciate everybody's help with Mr. Walter Hernandez care.  Lattie Haw, MD  Psalm 34:4

## 2019-06-05 NOTE — Progress Notes (Signed)
Rolling walker, 3in1, and resting hand splint delivered to bedside.   AVS given and reviewed with pt. Medications discussed. All questions answered to satisfaction. Pt verbalized understanding of information given. Pt to be escorted off the unit with all belongings via wheelchair by volunteer services.

## 2019-06-05 NOTE — Progress Notes (Signed)
Pt returned to room 6N14 after procedure. Received report from [place], Therapist, sports. Will continue to monitor.

## 2019-06-05 NOTE — Discharge Summary (Signed)
Physician Discharge Summary  Walter Hernandez OXB:[id number] DOB: 10-14-1950 DOA: 06/03/2019  PCP: Lavone Orn, MD  Admit date: 06/03/2019 Discharge date: 06/05/2019  Admitted From: Home Disposition: Home  Recommendations for Outpatient Follow-up:  1. Follow up with PCP in 1-2 weeks 2. Please obtain BMP/CBC in one week  Discharge Condition: Guarded CODE STATUS: Full Diet recommendation: As tolerated  Brief/Interim Summary: Pt. with PMH of malignant melanoma of the skin which was excised in 2016,GERD, hypertension; presented with complain of left-sided weakness, was found to have multiple metastatic brain lesion as well as mediastinal adenopathy.  Medical oncology, radiation oncology, pulmonary consulted. Patient scheduled for bronchoscopy on 06/05/2019.  Patient admitted as above with weakness found to have multiple metastatic brain lesions and adenopathy on imaging.  Status post bronchoscopy earlier today now stable for further outpatient work-up with heme-onc including but not limited to PET scan and Port-A-Cath placement.  Pending biopsy results will defer to heme-onc for further evaluation and treatment. Patient does have notable history of malignant melanoma as above however given discussion with heme-onc this appears more likely metastatic bronchogenic carcinoma given personal history. He will need to be on steroids when he goes home.  He will also need to be on PPI and also adding antifungal so he does not get thrush.  Patient otherwise stable and agreeable for discharge home.  Discharge Diagnoses:  Principal Problem:   Brain metastases ([place]) Active Problems:   Malignant melanoma of skin of left lower leg (HCC)   Essential hypertension   GERD (gastroesophageal reflux disease)    Discharge Instructions  Discharge Instructions    Call MD for:  difficulty breathing, headache or visual disturbances   Complete by: As directed    Call MD for:  extreme fatigue   Complete by: As  directed    Call MD for:  hives   Complete by: As directed    Call MD for:  persistant dizziness or light-headedness   Complete by: As directed    Call MD for:  persistant nausea and vomiting   Complete by: As directed    Call MD for:  severe uncontrolled pain   Complete by: As directed    Call MD for:  temperature >100.4   Complete by: As directed    Diet - low sodium heart healthy   Complete by: As directed    Increase activity slowly   Complete by: As directed      Allergies as of 06/05/2019   No Known Allergies     Medication List    TAKE these medications   acetaminophen 500 MG tablet Commonly known as: TYLENOL Take 1,000 mg by mouth every 6 (six) hours as needed for moderate pain.   amLODipine 5 MG tablet Commonly known as: NORVASC Take 5 mg by mouth daily.   dexamethasone 6 MG tablet Commonly known as: DECADRON Take 2 tablets (12 mg total) by mouth every 12 (twelve) hours.   Emergen-C Immune Plus Pack Take 1 Package by mouth daily as needed (immune system).   fluconazole 100 MG tablet Commonly known as: DIFLUCAN Take 1 tablet (100 mg total) by mouth daily.   IBUPROFEN & ACETAMINOPHEN PO Take 1-2 tablets by mouth every 6 (six) hours as needed (pain).   multivitamin capsule Take 1 capsule by mouth daily.   omeprazole 40 MG capsule Commonly known as: PRILOSEC Take 40 mg by mouth daily.   ondansetron 4 MG tablet Commonly known as: ZOFRAN Take 1 tablet (4 mg total) by mouth every 6 (  six) hours as needed for nausea.   OPCON-A OP Place 2 drops into both eyes as needed (for dry eyes).   tamsulosin 0.4 MG Caps capsule Commonly known as: FLOMAX Take 0.4 mg by mouth daily.       No Known Allergies  Consultations:  Heme-onc: Dr Marin Olp   Procedures/Studies: CT HEAD WO CONTRAST  Result Date: 06/03/2019 CLINICAL DATA:  Left arm weakness over the last week. EXAM: CT HEAD WITHOUT CONTRAST TECHNIQUE: Contiguous axial images were obtained from the  base of the skull through the vertex without intravenous contrast. COMPARISON:  None. FINDINGS: Brain: No abnormality is seen affecting the brainstem or cerebellum. Left cerebral hemisphere shows subcortical white matter edema in the left posterior parietal region and at the left frontal vertex. Right cerebral hemisphere shows a large region of vasogenic edema within the right frontoparietal white matter with intact overlying cortex. Probable cortical thickening. These findings are not typical of ordinary infarctions and raise the possibility of either metastatic disease, multifocal primary brain malignancy or lymphoma. Cerebritis could also be possible. Recommend MRI with and without contrast when able. No hydrocephalus. No midline shift. No extra-axial fluid collection. Vascular: There is atherosclerotic calcification of the major vessels at the base of the brain. Skull: Negative Sinuses/Orbits: Clear/normal Other: None IMPRESSION: Pronounced abnormality of the right frontoparietal region with extensive vasogenic white matter edema and probable abnormal cortical thickening and hyperdensity. Smaller area of vasogenic edema within the left posterior parietal subcortical white matter and the left frontal subcortical white matter. Most likely diagnosis is occult metastatic disease. Multifocal primary brain malignancy and lymphoma are also possible. Lastly, septic emboli/cerebritis are considered. Recommend brain MRI with and without contrast. Electronically Signed   By: Nelson Chimes M.D.   On: 06/03/2019 13:24   CT Chest W Contrast  Result Date: 06/03/2019 CLINICAL DATA:  Brain MR demonstrating metastatic disease. History of melanoma 4 years ago. EXAM: CT CHEST, ABDOMEN, AND PELVIS WITH CONTRAST TECHNIQUE: Multidetector CT imaging of the chest, abdomen and pelvis was performed following the standard protocol during bolus administration of intravenous contrast. CONTRAST:  159mL OMNIPAQUE IOHEXOL 300 MG/ML  SOLN  COMPARISON:  Chest radiograph report 04/29/99 brain MR of earlier today. FINDINGS: CT CHEST FINDINGS Cardiovascular: Mild motion degradation involving the upper and mid chest. Aortic and branch vessel atherosclerosis. Tortuous thoracic aorta. Normal heart size, without pericardial effusion. No central pulmonary embolism, on this non-dedicated study. Mediastinum/Nodes: Extensive mediastinal adenopathy. Right paratracheal node measures 1.6 cm on 18/3. Subcarinal node measures 1.5 cm on 29/3. Right hilar adenopathy at 1.6 cm on 30/3. Prevascular node of 1.0 cm on 22/3. Lungs/Pleura: No pleural fluid. Bronchial wall thickening is most significant in the right middle and right lower lobes, including on 82/5. Centrilobular emphysema. Right middle lobe 5 mm pulmonary nodule on 96/5. Right lower lobe 1.0 cm nodule including on 84/5. Likely calcified 2 mm posterior right upper lobe pulmonary nodule on 63/5. Musculoskeletal: No acute osseous abnormality. CT ABDOMEN PELVIS FINDINGS Hepatobiliary: Vague hypoattenuating liver lesions bilaterally. Example in the right hepatic lobe at 8 mm on 54/3 and within the lateral segment left liver lobe 1.1 cm on 57/3. Normal gallbladder, without biliary ductal dilatation. Pancreas: Normal, without mass or ductal dilatation. Spleen: Normal in size, without focal abnormality. Adrenals/Urinary Tract: Normal adrenal glands. Normal left kidney. Too small to characterize interpolar right renal lesion. No hydronephrosis. Bladder wall irregularity with small saccules posteriorly. Stomach/Bowel: Proximal gastric underdistention. Scattered colonic diverticula. Normal terminal ileum. Normal small bowel. Vascular/Lymphatic: Advanced aortic  and branch vessel atherosclerosis. Porta hepatis adenopathy, including a nodal mass of 2.6 x 3.1 cm on 66/3. No pelvic sidewall adenopathy. Reproductive: Mild prostatomegaly. Other: No significant free fluid. No evidence of omental or peritoneal disease.  Musculoskeletal: Thoracolumbar spondylosis. S-shaped thoracolumbar spine curvature. IMPRESSION: 1. Thoracoabdominal adenopathy and liver lesions, favored to represent metastatic disease. Given emphysema and small pulmonary nodules, differential considerations include metastatic bronchogenic carcinoma and melanoma. 2. Motion degraded evaluation of the upper chest. 3. Aortic atherosclerosis (ICD10-I70.0) and emphysema (ICD10-J43.9). 4. Prostatomegaly with a component of bladder outlet obstruction. Electronically Signed   By: Abigail Miyamoto M.D.   On: 06/03/2019 20:02   MR Brain W and Wo Contrast  Result Date: 06/03/2019 CLINICAL DATA:  Left arm weakness.  Abnormal CT head EXAM: MRI HEAD WITHOUT AND WITH CONTRAST TECHNIQUE: Multiplanar, multiecho pulse sequences of the brain and surrounding structures were obtained without and with intravenous contrast. CONTRAST:  64mL GADAVIST GADOBUTROL 1 MMOL/ML IV SOLN COMPARISON:  CT head 06/03/2019 FINDINGS: Brain: Multiple enhancing mass lesions are present in the brain. Largest in the right posterior frontal lobe shows homogeneous enhancement measuring 35 x 27 mm. The mass appears to infiltrate and thicken the cortex with extensive adjacent vasogenic edema. This corresponds to the primary CT abnormality Additional smaller enhancing mass lesions are present. 6 mm enhancing lesion right frontal lobe. 11 mm enhancing lesion left frontal lobe with mild edema. 9 mm enhancing lesion left frontal lobe. 3 mm enhancing lesion left temporoparietal periventricular white matter. 12 mm enhancing lesion left posterior parietal lobe with edema. These lesions show significant restricted diffusion. No associated hemorrhage Ventricle size normal.  No midline shift. Vascular: Normal arterial flow voids. Skull and upper cervical spine: No focal skeletal lesion. Sinuses/Orbits: Mild mucosal edema paranasal sinuses.  Normal orbit Other: None IMPRESSION: Multiple enhancing mass lesions in the brain  with surrounding edema compatible with metastatic disease. Patient has history of melanoma. Electronically Signed   By: Franchot Gallo M.D.   On: 06/03/2019 18:13   CT ABDOMEN PELVIS W CONTRAST  Result Date: 06/03/2019 CLINICAL DATA:  Brain MR demonstrating metastatic disease. History of melanoma 4 years ago. EXAM: CT CHEST, ABDOMEN, AND PELVIS WITH CONTRAST TECHNIQUE: Multidetector CT imaging of the chest, abdomen and pelvis was performed following the standard protocol during bolus administration of intravenous contrast. CONTRAST:  127mL OMNIPAQUE IOHEXOL 300 MG/ML  SOLN COMPARISON:  Chest radiograph report 04/29/99 brain MR of earlier today. FINDINGS: CT CHEST FINDINGS Cardiovascular: Mild motion degradation involving the upper and mid chest. Aortic and branch vessel atherosclerosis. Tortuous thoracic aorta. Normal heart size, without pericardial effusion. No central pulmonary embolism, on this non-dedicated study. Mediastinum/Nodes: Extensive mediastinal adenopathy. Right paratracheal node measures 1.6 cm on 18/3. Subcarinal node measures 1.5 cm on 29/3. Right hilar adenopathy at 1.6 cm on 30/3. Prevascular node of 1.0 cm on 22/3. Lungs/Pleura: No pleural fluid. Bronchial wall thickening is most significant in the right middle and right lower lobes, including on 82/5. Centrilobular emphysema. Right middle lobe 5 mm pulmonary nodule on 96/5. Right lower lobe 1.0 cm nodule including on 84/5. Likely calcified 2 mm posterior right upper lobe pulmonary nodule on 63/5. Musculoskeletal: No acute osseous abnormality. CT ABDOMEN PELVIS FINDINGS Hepatobiliary: Vague hypoattenuating liver lesions bilaterally. Example in the right hepatic lobe at 8 mm on 54/3 and within the lateral segment left liver lobe 1.1 cm on 57/3. Normal gallbladder, without biliary ductal dilatation. Pancreas: Normal, without mass or ductal dilatation. Spleen: Normal in size, without focal abnormality. Adrenals/Urinary  Tract: Normal adrenal  glands. Normal left kidney. Too small to characterize interpolar right renal lesion. No hydronephrosis. Bladder wall irregularity with small saccules posteriorly. Stomach/Bowel: Proximal gastric underdistention. Scattered colonic diverticula. Normal terminal ileum. Normal small bowel. Vascular/Lymphatic: Advanced aortic and branch vessel atherosclerosis. Porta hepatis adenopathy, including a nodal mass of 2.6 x 3.1 cm on 66/3. No pelvic sidewall adenopathy. Reproductive: Mild prostatomegaly. Other: No significant free fluid. No evidence of omental or peritoneal disease. Musculoskeletal: Thoracolumbar spondylosis. S-shaped thoracolumbar spine curvature. IMPRESSION: 1. Thoracoabdominal adenopathy and liver lesions, favored to represent metastatic disease. Given emphysema and small pulmonary nodules, differential considerations include metastatic bronchogenic carcinoma and melanoma. 2. Motion degraded evaluation of the upper chest. 3. Aortic atherosclerosis (ICD10-I70.0) and emphysema (ICD10-J43.9). 4. Prostatomegaly with a component of bladder outlet obstruction. Electronically Signed   By: Abigail Miyamoto M.D.   On: 06/03/2019 20:02    Subjective: No acute issues or events overnight, feels quite well, requesting discharge home.  Denies headache, fevers, chills, nausea, vomiting, diarrhea, constipation.   Discharge Exam: Vitals:   06/04/19 2135 06/05/19 0620  BP: 114/74 (!) 131/58  Pulse: 79 72  Resp: 15 16  Temp: 98.1 F (36.7 C) 98 F (36.7 C)  SpO2: 94% 97%   Vitals:   06/04/19 0438 06/04/19 1500 06/04/19 2135 06/05/19 0620  BP: (!) 133/91 129/72 114/74 (!) 131/58  Pulse: 74 87 79 72  Resp: 16 20 15 16   Temp: 98 F (36.7 C) 97.6 F (36.4 C) 98.1 F (36.7 C) 98 F (36.7 C)  TempSrc: Oral Oral Oral Oral  SpO2: 97% 98% 94% 97%  Weight:      Height:        General:  Pleasantly resting in bed, No acute distress. HEENT:  Normocephalic atraumatic.  Sclerae nonicteric, noninjected.   Extraocular movements intact bilaterally. Neck:  Without mass or deformity.  Trachea is midline. Lungs:  Clear to auscultate bilaterally without rhonchi, wheeze, or rales. Heart:  Regular rate and rhythm.  Without murmurs, rubs, or gallops. Abdomen:  Soft, nontender, nondistended.  Without guarding or rebound. Extremities: Without cyanosis, clubbing, edema, or obvious deformity. Vascular:  Dorsalis pedis and posterior tibial pulses palpable bilaterally. Skin:  Warm and dry, no erythema, no ulcerations.   The results of significant diagnostics from this hospitalization (including imaging, microbiology, ancillary and laboratory) are listed below for reference.     Microbiology: Recent Results (from the past 240 hour(s))  SARS CORONAVIRUS 2 (TAT 6-24 HRS) Nasopharyngeal Nasopharyngeal Swab     Status: None   Collection Time: 06/03/19  6:13 PM   Specimen: Nasopharyngeal Swab  Result Value Ref Range Status   SARS Coronavirus 2 NEGATIVE NEGATIVE Final    Comment: (NOTE) SARS-CoV-2 target nucleic acids are NOT DETECTED. The SARS-CoV-2 RNA is generally detectable in upper and lower respiratory specimens during the acute phase of infection. Negative results do not preclude SARS-CoV-2 infection, do not rule out co-infections with other pathogens, and should not be used as the sole basis for treatment or other patient management decisions. Negative results must be combined with clinical observations, patient history, and epidemiological information. The expected result is Negative. Fact Sheet for Patients: SugarRoll.be Fact Sheet for Healthcare Providers: https://www.woods-mathews.com/ This test is not yet approved or cleared by the [place] FDA and  has been authorized for detection and/or diagnosis of SARS-CoV-2 by FDA under an Emergency Use Authorization (EUA). This EUA will remain  in effect (meaning this test can be used) for the duration of  the  COVID-19 declaration under Section 56 4(b)(1) of the Act, 21 U.S.C. section 360bbb-3(b)(1), unless the authorization is terminated or revoked sooner. Performed at [facility], [place] [address] [place], [place] [address]   Surgical PCR screen     Status: Abnormal   Collection Time: 06/04/19  9:57 PM   Specimen: Nasal Mucosa; Nasal Swab  Result Value Ref Range Status   MRSA, PCR POSITIVE (A) NEGATIVE Final    Comment: RESULT CALLED TO, READ BACK BY AND VERIFIED WITH: N RAGAAS RN 06/05/19 0116 JDW    Staphylococcus aureus POSITIVE (A) NEGATIVE Final    Comment: (NOTE) The Xpert SA Assay (FDA approved for NASAL specimens in patients 27 years of age and older), is one component of a comprehensive surveillance program. It is not intended to diagnose infection nor to guide or monitor treatment. Performed at [facility], [place] [address], [place], [place] [address]      Labs: BNP (last 3 results) No results for input(s): BNP in the last 8760 hours. Basic Metabolic Panel: Recent Labs  Lab 06/03/19 1227 06/03/19 1233 06/04/19 0150 06/04/19 1656 06/05/19 0333  NA 136 135 138 133* 136  K 4.1 3.8 4.2 4.0 3.8  CL 98 99 102 100 101  CO2 24  --  25 21* 24  GLUCOSE 101* 99 140* 246* 138*  BUN 7* 8 7* 10 10  CREATININE 1.00 0.50* 0.67 0.76 0.60*  CALCIUM 9.7  --  9.6 9.2 9.3   Liver Function Tests: Recent Labs  Lab 06/03/19 1227 06/04/19 0150 06/04/19 1656 06/05/19 0333  AST 28 24 24 23   ALT 12 13 14 14   ALKPHOS 62 72 67 63  BILITOT 1.0 0.8 0.8 0.6  PROT 7.1 6.9 6.5 6.4*  ALBUMIN 3.9 3.7 3.4* 3.4*   No results for input(s): LIPASE, AMYLASE in the last 168 hours. No results for input(s): AMMONIA in the last 168 hours. CBC: Recent Labs  Lab 06/03/19 1227 06/03/19 1233 06/04/19 0150 06/04/19 1656 06/05/19 0333  WBC 9.2  --  8.6 10.8* 19.0*  NEUTROABS 6.6  --   --   --  15.7*  HGB 16.1 16.7 16.5 15.7 15.2  HCT 47.6 49.0 47.5 44.7 42.5  MCV 100.6*  --   98.1 97.8 95.7  PLT 255  --  266 287 282   Cardiac Enzymes: No results for input(s): CKTOTAL, CKMB, CKMBINDEX, TROPONINI in the last 168 hours. BNP: Invalid input(s): POCBNP CBG: Recent Labs  Lab 06/03/19 1453  GLUCAP 104*   D-Dimer No results for input(s): DDIMER in the last 72 hours. Hgb A1c No results for input(s): HGBA1C in the last 72 hours. Lipid Profile No results for input(s): CHOL, HDL, LDLCALC, TRIG, CHOLHDL, LDLDIRECT in the last 72 hours. Thyroid function studies No results for input(s): TSH, T4TOTAL, T3FREE, THYROIDAB in the last 72 hours.  Invalid input(s): FREET3 Anemia work up No results for input(s): VITAMINB12, FOLATE, FERRITIN, TIBC, IRON, RETICCTPCT in the last 72 hours. Urinalysis No results found for: COLORURINE, APPEARANCEUR, [place], [place], GLUCOSEU, [place], [place], [employer], PROTEINUR, UROBILINOGEN, NITRITE, LEUKOCYTESUR Sepsis Labs Invalid input(s): PROCALCITONIN,  WBC,  LACTICIDVEN Microbiology Recent Results (from the past 240 hour(s))  SARS CORONAVIRUS 2 (TAT 6-24 HRS) Nasopharyngeal Nasopharyngeal Swab     Status: None   Collection Time: 06/03/19  6:13 PM   Specimen: Nasopharyngeal Swab  Result Value Ref Range Status   SARS Coronavirus 2 NEGATIVE NEGATIVE Final    Comment: (NOTE) SARS-CoV-2 target nucleic acids are NOT  DETECTED. The SARS-CoV-2 RNA is generally detectable in upper and lower respiratory specimens during the acute phase of infection. Negative results do not preclude SARS-CoV-2 infection, do not rule out co-infections with other pathogens, and should not be used as the sole basis for treatment or other patient management decisions. Negative results must be combined with clinical observations, patient history, and epidemiological information. The expected result is Negative. Fact Sheet for Patients: SugarRoll.be Fact Sheet for Healthcare  Providers: https://www.woods-mathews.com/ This test is not yet approved or cleared by the [place] FDA and  has been authorized for detection and/or diagnosis of SARS-CoV-2 by FDA under an Emergency Use Authorization (EUA). This EUA will remain  in effect (meaning this test can be used) for the duration of the COVID-19 declaration under Section 56 4(b)(1) of the Act, 21 U.S.C. section 360bbb-3(b)(1), unless the authorization is terminated or revoked sooner. Performed at [facility], [place] [address], [place], [place] [address]   Surgical PCR screen     Status: Abnormal   Collection Time: 06/04/19  9:57 PM   Specimen: Nasal Mucosa; Nasal Swab  Result Value Ref Range Status   MRSA, PCR POSITIVE (A) NEGATIVE Final    Comment: RESULT CALLED TO, READ BACK BY AND VERIFIED WITH: N RAGAAS RN 06/05/19 0116 JDW    Staphylococcus aureus POSITIVE (A) NEGATIVE Final    Comment: (NOTE) The Xpert SA Assay (FDA approved for NASAL specimens in patients 28 years of age and older), is one component of a comprehensive surveillance program. It is not intended to diagnose infection nor to guide or monitor treatment. Performed at [facility], [place] [address], [place], [place] [address]    Time coordinating discharge: Over 30 minutes SIGNED:  Little Ishikawa, DO [facility] 06/05/2019, 10:55 AM Pager   If 7PM-7AM, please contact night-coverage [email or web address] Password [name]

## 2019-06-05 NOTE — TOC Initial Note (Signed)
Transition of Care [facility]) - Initial/Assessment Note    Patient Details  Name: Walter Hernandez MRN: [id number] Date of Birth: [birth year]  [facility] CM/SW Contact:    Marilu Favre, RN Phone Number: 06/05/2019, 12:43 PM  Clinical Narrative:                  Confirmed face sheet information. Provided choice for home health. Patient and wife have no preference.  Expected Discharge Plan: [place] Barriers to Discharge: No Barriers Identified   Patient Goals and CMS Choice Patient states their goals for this hospitalization and ongoing recovery are:: to return to home CMS Medicare.gov Compare Post Acute Care list provided to:: Patient Choice offered to / list presented to : Patient, Spouse  Expected Discharge Plan and Services Expected Discharge Plan: [place] In-house Referral: Clinical Social Work Discharge Planning Services: CM Consult Post Acute Care Choice: Durable Medical Equipment, Home Health Living arrangements for the past 2 months: Single Family Home Expected Discharge Date: 06/05/19               DME Arranged: 3-N-1, Walker rolling DME Agency: [employer] Date DME Agency Contacted: 06/05/19 Time DME Agency Contacted: [phone] Representative spoke with at DME Agency: [place] Arranged: PT, OT [place] Agency: [place] Date [place]: 06/05/19 Time [place]: [place] Representative spoke with at [place]: Tommi Rumps awaiting call back  Prior Living Arrangements/Services Living arrangements for the past 2 months: Single Family Home Lives with:: Spouse Patient language and need for interpreter reviewed:: Yes Do you feel safe going back to the place where you live?: Yes      Need for Family Participation in Patient Care: Yes (Comment) Care giver support system in place?: Yes (comment) Current home services: DME Criminal Activity/Legal Involvement Pertinent to Current Situation/Hospitalization: No -  Comment as needed  Activities of Daily Living      Permission Sought/Granted Permission sought to share information with : Family Supports Permission granted to share information with : Yes, Verbal Permission Granted  Share Information with NAME: wife Robin Searing     Permission granted to share info w Relationship: spouse  Permission granted to share info w Contact Information: [phone]  Emotional Assessment Appearance:: Appears stated age Attitude/Demeanor/Rapport: Engaged, Gracious Affect (typically observed): Accepting, Adaptable, Appropriate, Pleasant Orientation: : Oriented to Self, Oriented to Place, Oriented to  Time, Oriented to Situation Alcohol / Substance Use: Not Applicable Psych Involvement: No (comment)  Admission diagnosis:  Brain metastases (HCC) [C79.31] Left arm numbness [R20.0] Left leg numbness [R20.0] Left leg weakness [R29.898] Left arm weakness [R29.898] Patient Active Problem List   Diagnosis Date Noted  . Brain metastases ([place]) 06/03/2019  . Essential hypertension 06/03/2019  . GERD (gastroesophageal reflux disease) 06/03/2019  . Malignant melanoma of skin of left lower leg (HCC) 07/28/2015   PCP:  Lavone Orn, MD Pharmacy:   [facility], [place] [address] Phone: [phone] Fax: [phone]     Social Determinants of Health (SDOH) Interventions    Readmission Risk Interventions Readmission Risk Prevention Plan 06/04/2019  Post Dischage Appt Not Complete  Appt Comments pending stability/disposition  Medication Screening Complete  Transportation Screening Complete  Some recent data might be hidden

## 2019-06-05 NOTE — Transfer of Care (Signed)
Immediate Anesthesia Transfer of Care Note  Patient: Walter Hernandez  Procedure(s) Performed: VIDEO BRONCHOSCOPY WITH ENDOBRONCHIAL ULTRASOUND (N/A ) Bronchial Needle Aspiration Biopsies  Patient Location: PACU  Anesthesia Type:General  Level of Consciousness: drowsy  Airway & Oxygen Therapy: Patient Spontanous Breathing and Patient connected to face mask oxygen  Post-op Assessment: Report given to RN and Post -op Vital signs reviewed and stable  Post vital signs: Reviewed and stable  Last Vitals:  Vitals Value Taken Time  BP 107/67 06/05/19 1123  Temp    Pulse 63 06/05/19 1126  Resp 19 06/05/19 1125  SpO2 93 % 06/05/19 1126  Vitals shown include unvalidated device data.  Last Pain:  Vitals:   06/05/19 0915  TempSrc:   PainSc: 0-No pain         Complications: No apparent anesthesia complications

## 2019-06-05 NOTE — Plan of Care (Signed)
  Problem: Education: Goal: Knowledge of General Education information will improve Description: Including pain rating scale, medication(s)/side effects and non-pharmacologic comfort measures Outcome: Progressing   Problem: Health Behavior/Discharge Planning: Goal: Ability to manage health-related needs will improve Outcome: Progressing   Problem: Clinical Measurements: Goal: Ability to maintain clinical measurements within normal limits will improve Outcome: Progressing Goal: Respiratory complications will improve Outcome: Progressing Goal: Cardiovascular complication will be avoided Outcome: Progressing   Problem: Activity: Goal: Risk for activity intolerance will decrease Outcome: Progressing   Problem: Coping: Goal: Level of anxiety will decrease Outcome: Progressing   Problem: Elimination: Goal: Will not experience complications related to urinary retention Outcome: Progressing   Problem: Pain Managment: Goal: General experience of comfort will improve Outcome: Progressing   Problem: Safety: Goal: Ability to remain free from injury will improve Outcome: Progressing   Problem: Skin Integrity: Goal: Risk for impaired skin integrity will decrease Outcome: Progressing

## 2019-06-05 NOTE — Anesthesia Preprocedure Evaluation (Addendum)
Anesthesia Evaluation  Patient identified by MRN, date of birth, ID band Patient awake    Reviewed: Allergy & Precautions, NPO status , Patient's Chart, lab work & pertinent test results  History of Anesthesia Complications Negative for: history of anesthetic complications  Airway Mallampati: I  TM Distance: >3 FB Neck ROM: Full    Dental  (+) Edentulous Upper, Edentulous Lower   Pulmonary Current Smoker,  06/03/2019 SARS coronavirus NEG Malignant melanoma: mediastinal adenopathy, lung mets   No trouble breathing when supine   breath sounds clear to auscultation       Cardiovascular hypertension, Pt. on medications (-) angina Rhythm:Regular Rate:Normal     Neuro/Psych Presented with left-sided weakness, was found to have multiple metastatic brain lesions as well as mediastinal adenopathy    GI/Hepatic Neg liver ROS, GERD  Controlled and Medicated,  Endo/Other  negative endocrine ROS  Renal/GU negative Renal ROS     Musculoskeletal   Abdominal   Peds  Hematology negative hematology ROS (+)   Anesthesia Other Findings Malignant melanoma of leg  Reproductive/Obstetrics                           Anesthesia Physical Anesthesia Plan  ASA: III  Anesthesia Plan: General   Post-op Pain Management:    Induction: Intravenous  PONV Risk Score and Plan: 1 and Ondansetron and Dexamethasone  Airway Management Planned: Oral ETT  Additional Equipment:   Intra-op Plan:   Post-operative Plan: Extubation in OR  Informed Consent: I have reviewed the patients History and Physical, chart, labs and discussed the procedure including the risks, benefits and alternatives for the proposed anesthesia with the patient or authorized representative who has indicated his/her understanding and acceptance.       Plan Discussed with: CRNA and Surgeon  Anesthesia Plan Comments:        Anesthesia  Quick Evaluation

## 2019-06-05 NOTE — Procedures (Signed)
Video Bronchoscopy with Endobronchial Ultrasound Procedure Note  Date of Operation: 06/05/2019  Pre-op Diagnosis: Adenopathy  Post-op Diagnosis: Adenopathy  Surgeon: Dr. Tamala Julian  Anesthesia: General endotracheal anesthesia  Operation: Flexible video fiberoptic bronchoscopy with endobronchial ultrasound and biopsies.  Estimated Blood Loss: Minimal  Complications: None  Indications and History: Walter Hernandez is a 68 y.o. male with brain mass and adenopathy.  The risks, benefits, complications, treatment options and expected outcomes were discussed with the patient.  The possibilities of pneumothorax, pneumonia, reaction to medication, pulmonary aspiration, perforation of a viscus, bleeding, failure to diagnose a condition and creating a complication requiring transfusion or operation were discussed with the patient who freely signed the consent.    Description of Procedure: The patient was examined in the preoperative area and history and data from the preprocedure consultation were reviewed. It was deemed appropriate to proceed.  The patient was taken to OR11, identified as Walter Hernandez and the procedure verified as Flexible Video Fiberoptic Bronchoscopy.  A Time Out was held and the above information confirmed. After being taken to the operating room general anesthesia was initiated and the patient  was orally intubated. The video fiberoptic bronchoscope was introduced via the endotracheal tube and a general inspection was performed which showed chronic bronchitic changes. The standard scope was then withdrawn and the endobronchial ultrasound was used to identify and characterize the peritracheal, hilar and bronchial lymph nodes. Inspection showed adenopathy. Using real-time ultrasound guidance Wang needle biopsies were take from Station 4L/7/11R nodes and were sent for cytology. The patient tolerated the procedure well without apparent complications. There was no significant blood loss. The  bronchoscope was withdrawn. Anesthesia was reversed and the patient was taken to the PACU for recovery.   Samples: 1. Wang needle biopsies from 4L node 2. Wang needle biopsies from 7 node 3. Wang needle biopsies from 11R node   Plans:  The patient will be discharged from the PACU to home when recovered from anesthesia. We will review the cytology, pathology and microbiology results with the patient when they become available. Outpatient followup will be with oncology, I will call with results as well.    Erskine Emery PCCM

## 2019-06-05 NOTE — Progress Notes (Signed)
Orthopedic Tech Progress Note Patient Details:  Walter Hernandez 06-21-1950 [id number] Called in order to [employer] for a STAT RESTING HAND SPLINT for the LEFT and not the RIGHT. Patient ID: Walter Hernandez, male   DOB: 10-Sep-1950, 68 y.o.   MRN: [id number]   Janit Pagan 06/05/2019, 1:38 PM

## 2019-06-05 NOTE — Evaluation (Signed)
Occupational Therapy Evaluation Patient Details Name: Walter Hernandez MRN: [id number] DOB: December 21, 1950 Today's Date: 06/05/2019    History of Present Illness Walter Hernandez is a 68 y.o. M admitted for brain metastases. PMH includes malignant melanoma over L leg, HTN, and GERD. He is scheduled to undergo a bronchoscopy 12/30. He has L sided UE and LE weakness.   Clinical Impression   Patient evaluated by Occupational Therapy with no further acute OT needs identified. All education has been completed and the patient has no further questions. See below for any follow-up Occupational Therapy or equipment needs. OT to sign off. Thank you for referral.   Recommend follow up outpatient OT for L UE. Recommending L resting hand splint for night time wear only. Splint is to help with contracture management and PROM of digits at night time. Pt and wife educated on positioning, hygiene and AAROm L UE.     Follow Up Recommendations  Outpatient OT    Equipment Recommendations  Other (comment)(resting hand splint L UE- had 3n1 and rw delivered during OT)    Recommendations for Other Services       Precautions / Restrictions Precautions Precautions: Fall Restrictions Weight Bearing Restrictions: No      Mobility Bed Mobility               General bed mobility comments: oob in chair on arrival. supervision observed in first attempt today sit <>supine   Transfers Overall transfer level: Needs assistance Equipment used: Rolling walker (2 wheeled) Transfers: Sit to/from Stand Sit to Stand: Min guard         General transfer comment: supervision for safety; no physical assistance required; pt pushed up from bed with R UE with no LOB or unsteadiness    Balance Overall balance assessment: Mild deficits observed, not formally tested                                         ADL either performed or assessed with clinical judgement   ADL Overall ADL's : Needs  assistance/impaired Eating/Feeding: Set up   Grooming: Set up Grooming Details (indicate cue type and reason): educated on washing inside of L hand to decrease skin break down and bacteria Upper Body Bathing: Set up   Lower Body Bathing: Minimal assistance Lower Body Bathing Details (indicate cue type and reason): set Upper Body Dressing : Set up Upper Body Dressing Details (indicate cue type and reason): wife present when patient attempting to use L hand to zip jacket. wife doing task for patient. pt reports "i don t have my glasses" Lower Body Dressing: Minimal assistance   Toilet Transfer: Min guard;RW           Functional mobility during ADLs: Min guard;Rolling walker General ADL Comments: pt requires mod cues for L ue with RW. pt needs hand over hand to place L hand on RW     Vision Baseline Vision/History: Wears glasses Wears Glasses: At all times Additional Comments: no glasses present     Perception     Praxis      Pertinent Vitals/Pain Pain Assessment: No/denies pain     Hand Dominance Right   Extremity/Trunk Assessment Upper Extremity Assessment Upper Extremity Assessment: LUE deficits/detail LUE Deficits / Details: decreased sensation over middle and little fingers; pt demonstrates gross grasp but no release) LUE Sensation: decreased light touch LUE Coordination: decreased gross motor;decreased fine motor  Lower Extremity Assessment Lower Extremity Assessment: Defer to PT evaluation   Cervical / Trunk Assessment Cervical / Trunk Assessment: Normal   Communication Communication Communication: No difficulties   Cognition Arousal/Alertness: Awake/alert Behavior During Therapy: WFL for tasks assessed/performed Overall Cognitive Status: Impaired/Different from baseline                                 General Comments: pt shows impulsive behavior at moments during session but able to demonstrate teach back of exerices and education.     General Comments  educated on L hand hygiene and positioning    Exercises Exercises: Other exercises Other Exercises Other Exercises: scapula movements for L shoulder- pt able to shrug shoulder for scapula elevation abduction and down for depression, row backward for scapula adduction depression retraction Other Exercises: hand placement on knee for full extension of digits and wrist extension  Other Exercises: PROM L digits with R UE   Shoulder Instructions      Home Living Family/patient expects to be discharged to:: Private residence Living Arrangements: Spouse/significant other Available Help at Discharge: Family Type of Home: House Home Access: Stairs to enter Technical brewer of Steps: 2 Entrance Stairs-Rails: Right Home Layout: Two level     Bathroom Shower/Tub: Teacher, early years/pre: Standard Bathroom Accessibility: Yes   Home Equipment: Environmental consultant - 4 wheels;Cane - single point;Bedside commode;Grab bars - toilet;Grab bars - tub/shower          Prior Functioning/Environment Level of Independence: Independent                 OT Problem List: Decreased strength;Decreased range of motion;Decreased activity tolerance;Decreased cognition      OT Treatment/Interventions: Self-care/ADL training;Therapeutic exercise;Neuromuscular education;DME and/or AE instruction;Balance training;Patient/family education    OT Goals(Current goals can be found in the care plan section) Acute Rehab OT Goals Patient Stated Goal: go home OT Goal Formulation: With patient/family Time For Goal Achievement: 06/19/19 Potential to Achieve Goals: Good  OT Frequency:     Barriers to D/C:            Co-evaluation              AM-PAC OT "6 Clicks" Daily Activity     Outcome Measure Help from another person eating meals?: None Help from another person taking care of personal grooming?: None Help from another person toileting, which includes using toliet,  bedpan, or urinal?: A Little Help from another person bathing (including washing, rinsing, drying)?: A Little Help from another person to put on and taking off regular upper body clothing?: None Help from another person to put on and taking off regular lower body clothing?: A Little 6 Click Score: 21   End of Session Nurse Communication: Mobility status;Precautions  Activity Tolerance: Patient tolerated treatment well Patient left: in chair;with call bell/phone within reach;with family/visitor present  OT Visit Diagnosis: Muscle weakness (generalized) (M62.81)                Time: 5176-1607 OT Time Calculation (min): 19 min Charges:  OT General Charges $OT Visit: 1 Visit OT Evaluation $OT Eval Moderate Complexity: 1 Mod   Brynn, OTR/L  Acute Rehabilitation Services Pager: [phone] Office: [phone] .   Jeri Modena 06/05/2019, 1:30 PM

## 2019-06-06 ENCOUNTER — Encounter: Payer: Self-pay | Admitting: *Deleted

## 2019-06-06 ENCOUNTER — Other Ambulatory Visit: Payer: Self-pay | Admitting: *Deleted

## 2019-06-06 DIAGNOSIS — C7931 Secondary malignant neoplasm of brain: Secondary | ICD-10-CM

## 2019-06-06 DIAGNOSIS — C3491 Malignant neoplasm of unspecified part of right bronchus or lung: Secondary | ICD-10-CM

## 2019-06-06 LAB — CYTOLOGY - NON PAP

## 2019-06-06 MED ORDER — NICOTINE 21 MG/24HR TD PT24: 21.0000 mg | MEDICATED_PATCH | Freq: Every day | TRANSDERMAL | 0 refills | Status: DC

## 2019-06-06 NOTE — Progress Notes (Signed)
Patient's wife calling because patient is having a difficult time since quitting smoking several days ago. Prescription for nicotene patch sent into pharmacy per Dr Marin Olp.

## 2019-06-10 ENCOUNTER — Encounter: Payer: Self-pay | Admitting: *Deleted

## 2019-06-10 ENCOUNTER — Other Ambulatory Visit (HOSPITAL_COMMUNITY)
Admission: RE | Admit: 2019-06-10 | Discharge: 2019-06-10 | Disposition: A | Discharge status: Home / Self Care | Payer: Medicare Other | Source: Ambulatory Visit | Attending: Pulmonary Disease | Admitting: Pulmonary Disease

## 2019-06-10 ENCOUNTER — Other Ambulatory Visit: Payer: Self-pay | Admitting: *Deleted

## 2019-06-10 DIAGNOSIS — Z01812 Encounter for preprocedural laboratory examination: Secondary | ICD-10-CM | POA: Diagnosis not present

## 2019-06-10 DIAGNOSIS — Z20822 Contact with and (suspected) exposure to covid-19: Secondary | ICD-10-CM | POA: Diagnosis not present

## 2019-06-10 DIAGNOSIS — C3491 Malignant neoplasm of unspecified part of right bronchus or lung: Secondary | ICD-10-CM

## 2019-06-10 DIAGNOSIS — F411 Generalized anxiety disorder: Secondary | ICD-10-CM

## 2019-06-10 LAB — SARS CORONAVIRUS 2 (TAT 6-24 HRS): SARS Coronavirus 2: NEGATIVE

## 2019-06-10 MED ORDER — ALPRAZOLAM 0.5 MG PO TABS: 0.5000 mg | ORAL_TABLET | Freq: Four times a day (QID) | ORAL | 0 refills | Status: DC | PRN

## 2019-06-10 NOTE — Progress Notes (Signed)
Patient's wife is calling with concerns regarding patient. He is using the nicotine patch but is still very restless. Patient states "the patches aren't calming like smoking is". He is also not sleeping at night. He states "my brain will not shut off".  Patient continues his decadron BID.  Reviewed symptoms with Dr Marin Olp. A prescription will be sent in to the patient's confirmed preferred pharmacy.

## 2019-06-11 ENCOUNTER — Ambulatory Visit (HOSPITAL_COMMUNITY): Payer: Medicare Other | Admitting: Certified Registered Nurse Anesthetist

## 2019-06-11 ENCOUNTER — Encounter (HOSPITAL_COMMUNITY): Payer: Self-pay | Admitting: Pulmonary Disease

## 2019-06-11 ENCOUNTER — Other Ambulatory Visit: Payer: Self-pay | Admitting: Radiation Therapy

## 2019-06-11 ENCOUNTER — Other Ambulatory Visit: Payer: Self-pay

## 2019-06-11 ENCOUNTER — Ambulatory Visit (HOSPITAL_COMMUNITY)
Admission: RE | Admit: 2019-06-11 | Discharge: 2019-06-11 | Disposition: A | Discharge status: Home / Self Care | Payer: Medicare Other | Attending: Pulmonary Disease | Admitting: Pulmonary Disease

## 2019-06-11 ENCOUNTER — Encounter (HOSPITAL_COMMUNITY)
Admission: RE | Disposition: A | Discharge status: Home / Self Care | Payer: Self-pay | Source: Home / Self Care | Attending: Pulmonary Disease

## 2019-06-11 DIAGNOSIS — Z85820 Personal history of malignant melanoma of skin: Secondary | ICD-10-CM | POA: Diagnosis not present

## 2019-06-11 DIAGNOSIS — C3490 Malignant neoplasm of unspecified part of unspecified bronchus or lung: Secondary | ICD-10-CM | POA: Diagnosis not present

## 2019-06-11 DIAGNOSIS — I10 Essential (primary) hypertension: Secondary | ICD-10-CM | POA: Diagnosis not present

## 2019-06-11 DIAGNOSIS — R590 Localized enlarged lymph nodes: Secondary | ICD-10-CM

## 2019-06-11 DIAGNOSIS — C969 Malignant neoplasm of lymphoid, hematopoietic and related tissue, unspecified: Secondary | ICD-10-CM | POA: Insufficient documentation

## 2019-06-11 DIAGNOSIS — Z79899 Other long term (current) drug therapy: Secondary | ICD-10-CM | POA: Diagnosis not present

## 2019-06-11 DIAGNOSIS — C787 Secondary malignant neoplasm of liver and intrahepatic bile duct: Secondary | ICD-10-CM | POA: Insufficient documentation

## 2019-06-11 DIAGNOSIS — F17210 Nicotine dependence, cigarettes, uncomplicated: Secondary | ICD-10-CM | POA: Insufficient documentation

## 2019-06-11 DIAGNOSIS — C7931 Secondary malignant neoplasm of brain: Secondary | ICD-10-CM | POA: Diagnosis not present

## 2019-06-11 DIAGNOSIS — K219 Gastro-esophageal reflux disease without esophagitis: Secondary | ICD-10-CM | POA: Insufficient documentation

## 2019-06-11 HISTORY — PX: ENDOBRONCHIAL ULTRASOUND: SHX5096

## 2019-06-11 HISTORY — PX: FINE NEEDLE ASPIRATION: SHX5430

## 2019-06-11 HISTORY — PX: VIDEO BRONCHOSCOPY: SHX5072

## 2019-06-11 SURGERY — ENDOBRONCHIAL ULTRASOUND (EBUS)
Anesthesia: General

## 2019-06-11 MED ORDER — PHENYLEPHRINE 40 MCG/ML (10ML) SYRINGE FOR IV PUSH (FOR BLOOD PRESSURE SUPPORT)
PREFILLED_SYRINGE | INTRAVENOUS | Status: DC | PRN
  Administered 2019-06-11 (×3): 80 ug via INTRAVENOUS

## 2019-06-11 MED ORDER — OXYCODONE HCL 5 MG PO TABS: 5.0000 mg | ORAL_TABLET | Freq: Once | ORAL | Status: DC | PRN

## 2019-06-11 MED ORDER — LACTATED RINGERS IV SOLN: INTRAVENOUS | Status: DC

## 2019-06-11 MED ORDER — HYDROMORPHONE HCL 1 MG/ML IJ SOLN: 0.2500 mg | INTRAMUSCULAR | Status: DC | PRN

## 2019-06-11 MED ORDER — DEXAMETHASONE SODIUM PHOSPHATE 10 MG/ML IJ SOLN
INTRAMUSCULAR | Status: DC | PRN
  Administered 2019-06-11: 10 mg via INTRAVENOUS

## 2019-06-11 MED ORDER — OXYCODONE HCL 5 MG/5ML PO SOLN: 5.0000 mg | Freq: Once | ORAL | Status: DC | PRN

## 2019-06-11 MED ORDER — EPHEDRINE SULFATE-NACL 50-0.9 MG/10ML-% IV SOSY
PREFILLED_SYRINGE | INTRAVENOUS | Status: DC | PRN
  Administered 2019-06-11 (×5): 10 mg via INTRAVENOUS

## 2019-06-11 MED ORDER — LIDOCAINE 2% (20 MG/ML) 5 ML SYRINGE
INTRAMUSCULAR | Status: DC | PRN
  Administered 2019-06-11: 60 mg via INTRAVENOUS

## 2019-06-11 MED ORDER — ONDANSETRON HCL 4 MG/2ML IJ SOLN
INTRAMUSCULAR | Status: DC | PRN
  Administered 2019-06-11: 4 mg via INTRAVENOUS

## 2019-06-11 MED ORDER — PROMETHAZINE HCL 25 MG/ML IJ SOLN: 6.2500 mg | INTRAMUSCULAR | Status: DC | PRN

## 2019-06-11 MED ORDER — PROPOFOL 10 MG/ML IV BOLUS
INTRAVENOUS | Status: DC | PRN
  Administered 2019-06-11: 140 mg via INTRAVENOUS

## 2019-06-11 NOTE — Interval H&P Note (Signed)
History and Physical Interval Note:  06/11/2019 12:17 PM  Walter Hernandez  has presented today for surgery, with the diagnosis of adenopathy.  The various methods of treatment have been discussed with the patient and family. After consideration of risks, benefits and other options for treatment, the patient has consented to  Procedure(s): ENDOBRONCHIAL ULTRASOUND (N/A) VIDEO BRONCHOSCOPY WITHOUT FLUORO (N/A) as a surgical intervention.  The patient's history has been reviewed, patient examined, no change in status, stable for surgery.  I have reviewed the patient's chart and labs.  Questions were answered to the patient's satisfaction.    Patient seen in pre-op. All questions answered. Discussed risk of bleeding and pneumothorax. No barriers to proceed.   [place]

## 2019-06-11 NOTE — Anesthesia Preprocedure Evaluation (Signed)
Anesthesia Evaluation  Patient identified by MRN, date of birth, ID band Patient awake    Reviewed: Allergy & Precautions, NPO status , Patient's Chart, lab work & pertinent test results  History of Anesthesia Complications Negative for: history of anesthetic complications  Airway Mallampati: I  TM Distance: >3 FB Neck ROM: Full    Dental  (+) Edentulous Upper, Edentulous Lower   Pulmonary Current Smoker and Patient abstained from smoking.,  06/03/2019 SARS coronavirus NEG Malignant melanoma: mediastinal adenopathy, lung mets   No trouble breathing when supine   breath sounds clear to auscultation       Cardiovascular hypertension, Pt. on medications (-) angina Rhythm:Regular Rate:Normal     Neuro/Psych Presented with left-sided weakness, was found to have multiple metastatic brain lesions as well as mediastinal adenopathy    GI/Hepatic Neg liver ROS, GERD  Controlled and Medicated,  Endo/Other  negative endocrine ROS  Renal/GU negative Renal ROS     Musculoskeletal   Abdominal   Peds  Hematology negative hematology ROS (+)   Anesthesia Other Findings Malignant melanoma of leg  Reproductive/Obstetrics                             Anesthesia Physical  Anesthesia Plan  ASA: III  Anesthesia Plan: General   Post-op Pain Management:    Induction: Intravenous  PONV Risk Score and Plan: 1 and Ondansetron and Treatment may vary due to age or medical condition  Airway Management Planned: LMA  Additional Equipment:   Intra-op Plan:   Post-operative Plan: Extubation in OR  Informed Consent: I have reviewed the patients History and Physical, chart, labs and discussed the procedure including the risks, benefits and alternatives for the proposed anesthesia with the patient or authorized representative who has indicated his/her understanding and acceptance.       Plan Discussed with:  CRNA and Surgeon  Anesthesia Plan Comments:         Anesthesia Quick Evaluation

## 2019-06-11 NOTE — Progress Notes (Signed)
Walter Hernandez [phone] wife will drive patient home. Educated that he will need someone with him for 24 hours after surgery. Restricted from drinking ETOH, driving, or making legal decisions for the first 24 hours post anesthesia.

## 2019-06-11 NOTE — Anesthesia Procedure Notes (Signed)
Procedure Name: LMA Insertion Date/Time: 06/11/2019 12:38 PM Performed by: Genelle Bal, CRNA Pre-anesthesia Checklist: Patient identified, Emergency Drugs available, Suction available and Patient being monitored Patient Re-evaluated:Patient Re-evaluated prior to induction Oxygen Delivery Method: Circle system utilized Preoxygenation: Pre-oxygenation with 100% oxygen Induction Type: IV induction Ventilation: Mask ventilation without difficulty LMA: LMA inserted LMA Size: 4.0 Number of attempts: 1 Airway Equipment and Method: Bite block Placement Confirmation: positive ETCO2 Tube secured with: Tape Dental Injury: Teeth and Oropharynx as per pre-operative assessment

## 2019-06-11 NOTE — Op Note (Signed)
Video Bronchoscopy with Endobronchial Ultrasound Procedure Note  Date of Operation: 06/11/2019  Pre-op Diagnosis: Mediastinal adenopathy  Post-op Diagnosis: Mediastinal adenopathy  Surgeon: Garner Nash, DO   Assistants: None   Anesthesia: General endotracheal anesthesia  Operation: Flexible video fiberoptic bronchoscopy with endobronchial ultrasound and biopsies.  Estimated Blood Loss: Minimal, <9UE   Complications: None   Indications and History: Walter Hernandez is a 69 y.o. male with mediastinal adenopathy.  The risks, benefits, complications, treatment options and expected outcomes were discussed with the patient.  The possibilities of pneumothorax, pneumonia, reaction to medication, pulmonary aspiration, perforation of a viscus, bleeding, failure to diagnose a condition and creating a complication requiring transfusion or operation were discussed with the patient who freely signed the consent.    Description of Procedure: The patient was examined in the preoperative area and history and data from the preprocedure consultation were reviewed. It was deemed appropriate to proceed.  The patient was taken to [facility] endoscopy room 2, identified as Walter Hernandez and the procedure verified as Flexible Video Fiberoptic Bronchoscopy.  A Time Out was held and the above information confirmed. After being taken to the operating room general anesthesia was initiated and a LMA was placed.  8 cc of 1% lidocaine was used to anesthetize the vocal cords trachea and main carina. The video fiberoptic bronchoscope was introduced via the endotracheal tube and a general inspection was performed which showed bronchiectatic distal airway openings with scattered pitting. The standard scope was then withdrawn and the endobronchial ultrasound was used to identify and characterize the peritracheal, hilar and bronchial lymph nodes. Inspection showed enlarged 4L and 7 lymph nodes. Using real-time ultrasound guidance Wang needle  biopsies were take from Station 4L and station 7 nodes and were sent for cytology.  10 passes with in each node using [employer] 22-gauge needle was completed. The patient tolerated the procedure well without apparent complications. There was no significant blood loss. The bronchoscope was withdrawn. Anesthesia was reversed and the patient was taken to the PACU for recovery.   Samples: 1. Wang needle biopsies from station 4L node 2. Wang needle biopsies from station 7 node  Plans:  The patient will be discharged from the PACU to home when recovered from anesthesia. We will review the cytology, pathology and microbiology results with the patient when they become available. Outpatient followup will be with Octavio Graves Bhavik Cabiness, DO.   Preliminary pathology: Discussed with Dr. Genelle Gather on the phone in the endoscopy suite.  Station 4L with atypical cells.  Station 7 with suspicious cells for possible small cell carcinoma.  Garner Nash, DO [facility] 06/11/2019 2:32 PM

## 2019-06-11 NOTE — Discharge Instructions (Signed)
Flexible Bronchoscopy, Care After This sheet gives you information about how to care for yourself after your test. Your doctor may also give you more specific instructions. If you have problems or questions, contact your doctor. Follow these instructions at home: Eating and drinking  The day after the test, go back to your normal diet. Driving  Do not drive for 24 hours if you were given a medicine to help you relax (sedative).  Do not drive or use heavy machinery while taking prescription pain medicine. General instructions   Take over-the-counter and prescription medicines only as told by your doctor.  Return to your normal activities as told. Ask what activities are safe for you.  Do not use any products that have nicotine or tobacco in them. This includes cigarettes and e-cigarettes. If you need help quitting, ask your doctor.  Keep all follow-up visits as told by your doctor. This is important. It is very important if you had a tissue sample (biopsy) taken. Get help right away if:  You have shortness of breath that gets worse.  You get light-headed.  You feel like you are going to pass out (faint).  You have chest pain.  You cough up: ? More than a little blood. ? More blood than before. Summary  Do not eat or drink anything (not even water) for 2 hours after your test, or until your numbing medicine wears off.  Do not use cigarettes. Do not use e-cigarettes.  Get help right away if you have chest pain. This information is not intended to replace advice given to you by your health care provider. Make sure you discuss any questions you have with your health care provider. Document Revised: 05/05/2017 Document Reviewed: 06/10/2016 Elsevier Patient Education  2020 [employer].

## 2019-06-11 NOTE — Transfer of Care (Signed)
Immediate Anesthesia Transfer of Care Note  Patient: Walter Hernandez  Procedure(s) Performed: ENDOBRONCHIAL ULTRASOUND (N/A ) VIDEO BRONCHOSCOPY WITHOUT FLUORO (N/A ) FINE NEEDLE ASPIRATION (FNA) LINEAR  Patient Location: PACU  Anesthesia Type:General  Level of Consciousness: awake, alert  and oriented  Airway & Oxygen Therapy: Patient Spontanous Breathing and Patient connected to face mask oxygen  Post-op Assessment: Report given to RN and Post -op Vital signs reviewed and stable  Post vital signs: Reviewed and stable  Last Vitals:  Vitals Value Taken Time  BP 130/77 06/11/19 1426  Temp 36.5 C 06/11/19 1426  Pulse 93 06/11/19 1434  Resp 20 06/11/19 1434  SpO2 96 % 06/11/19 1434  Vitals shown include unvalidated device data.  Last Pain:  Vitals:   06/11/19 1426  TempSrc:   PainSc: 0-No pain      Patients Stated Pain Goal: 3 (13/68/59 9234)  Complications: No apparent anesthesia complications

## 2019-06-12 ENCOUNTER — Other Ambulatory Visit: Payer: Self-pay | Admitting: Neurosurgery

## 2019-06-12 ENCOUNTER — Encounter: Payer: Self-pay | Admitting: *Deleted

## 2019-06-12 ENCOUNTER — Other Ambulatory Visit: Payer: Self-pay | Admitting: *Deleted

## 2019-06-12 DIAGNOSIS — C7931 Secondary malignant neoplasm of brain: Secondary | ICD-10-CM

## 2019-06-12 MED ORDER — DEXAMETHASONE 6 MG PO TABS: 12.0000 mg | ORAL_TABLET | Freq: Every day | ORAL | 1 refills | Status: DC

## 2019-06-12 MED FILL — DEXAMETHASONE 4 MG TABLET: 4 | 30 days supply | Qty: 90 | Fill #0

## 2019-06-12 NOTE — Progress Notes (Signed)
Received a call from [place], the patient's wife. She is very overwhelmed with all the upcoming appointments and needs clarification on each and what the needed preparation is. She is also confused because last evening, an appointment for a craniotomy appeared and she, nor the patient, have had any discussion with anyone about a surgical procedure. In addition to her questions about the appointments, she also has the following concerns:  Urinary frequency, and not feeling like the bladder is emptying. Patient is on flomax, but during the night this doesn't seem to be helping. She would also like to know how long the patient is going to be on decadron as she is having difficulty obtaining the medication from the pharmacy, having to return multiple times to get a few days fill. And lastly, she would like to know if the patient can take a xanax prior to his PET scan on Monday.  Spoke with Dr Marin Olp. He was unaware of a craniotomy being scheduled. He will reach out to Dr Vertell Limber and speak with him. He would like the patient to decrease decadron to 12mg  daily. This may help his urinary problems. He will likely stay on the steroids until initiation of treatment and then dosing will be titrated down. Patient can take a xanax about one hour before his PET scan.  Craniotomy is scheduled in case the attempt at pathology from his bronch yesterday is unsuccessful. If enough tissue is obtained for diagnosis, the craniotomy will be cancelled.  Everything reviewed with Kennyth Lose to her satisfaction. She has my contact information if she has any other questions or concerns. A prescription for the decadron was sent to the downstairs pharmacy as they had a full supply to fill prescription.

## 2019-06-12 NOTE — Anesthesia Postprocedure Evaluation (Signed)
Anesthesia Post Note  Patient: Walter Hernandez  Procedure(s) Performed: ENDOBRONCHIAL ULTRASOUND (N/A ) VIDEO BRONCHOSCOPY WITHOUT FLUORO (N/A ) FINE NEEDLE ASPIRATION (FNA) LINEAR     Anesthesia Post Evaluation  Last Vitals:  Vitals:   06/11/19 1440 06/11/19 1453  BP: 118/72 103/67  Pulse: (!) 101 94  Resp: 13 16  Temp:  36.5 C  SpO2: 94% 95%    Last Pain:  Vitals:   06/11/19 1453  TempSrc:   PainSc: 0-No pain   Pain Goal: Patients Stated Pain Goal: 3 (06/11/19 1051)                 Lynda Rainwater

## 2019-06-13 ENCOUNTER — Ambulatory Visit (HOSPITAL_COMMUNITY): Payer: Medicare Other

## 2019-06-13 ENCOUNTER — Encounter: Payer: Self-pay | Admitting: *Deleted

## 2019-06-13 DIAGNOSIS — C4372 Malignant melanoma of left lower limb, including hip: Secondary | ICD-10-CM

## 2019-06-13 MED ORDER — TAMSULOSIN HCL 0.4 MG PO CAPS: 0.8000 mg | ORAL_CAPSULE | Freq: Every day | ORAL | 1 refills | Status: DC

## 2019-06-13 MED FILL — TAMSULOSIN HCL 0.4 MG CAP: 0.4 | 30 days supply | Qty: 60 | Fill #0

## 2019-06-13 NOTE — Progress Notes (Signed)
Patient's wife, Kennyth Lose, calling stating that patient is still having difficulty with urination. He would like to increase his Flomax to 0.8mg  daily from the 0.4mg  daily he's been taking.   Reviewed with Dr Marin Olp and he agrees with the dose increase. New prescription sent. Kennyth Lose aware of new prescription and pharmacy confirmed.

## 2019-06-14 ENCOUNTER — Ambulatory Visit (HOSPITAL_COMMUNITY)
Admission: RE | Admit: 2019-06-14 | Discharge: 2019-06-14 | Disposition: A | Discharge status: Home / Self Care | Payer: Medicare Other | Source: Ambulatory Visit | Attending: Radiation Oncology | Admitting: Radiation Oncology

## 2019-06-14 ENCOUNTER — Encounter: Payer: Self-pay | Admitting: *Deleted

## 2019-06-14 ENCOUNTER — Other Ambulatory Visit: Payer: Self-pay

## 2019-06-14 DIAGNOSIS — C7931 Secondary malignant neoplasm of brain: Secondary | ICD-10-CM | POA: Diagnosis not present

## 2019-06-14 LAB — CYTOLOGY - NON PAP

## 2019-06-14 IMAGING — MR MR HEAD WO/W CM
8 of 11 series · 24 of 48 positions shown · IV contrast (gadavist)
Comparison: Brain MRI [DATE] and earlier.

CLINICAL DATA: 68-year-old male with metastatic disease to the
brain. History of melanoma 4 years ago. Stereotactic treatment
planning.

EXAM:
MRI HEAD WITHOUT AND WITH CONTRAST
TECHNIQUE: Multiplanar, multiecho pulse sequences of the brain and surrounding
structures were obtained without and with intravenous contrast.
CONTRAST:  6mL GADAVIST GADOBUTROL 1 MMOL/ML IV SOLN

[Series 2: FLAIR · sagittal · 3.0mm · 0.47mm/px · 2 of 36 slices shown (1 of 2)]
[im 1/36]
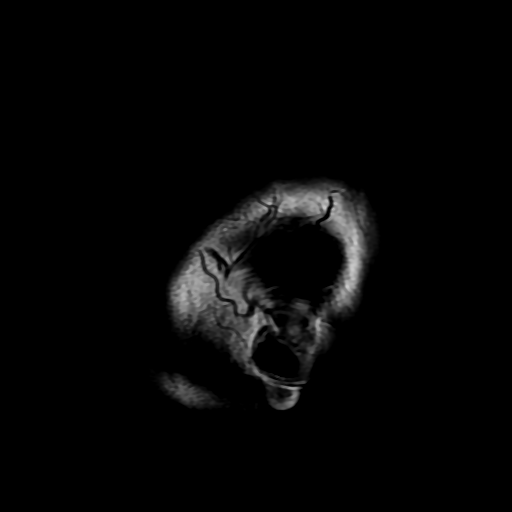
[im 36/36]
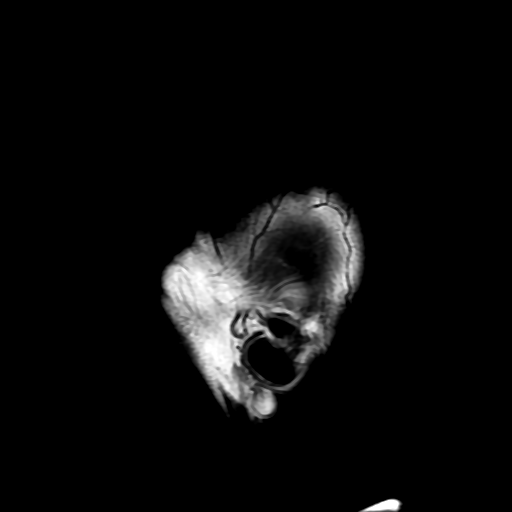

[Series 3: DWI · axial · 3.0mm · 0.94mm/px · z∈[-51,+110]mm · 6 of 112 slices shown]
[im 1/112]
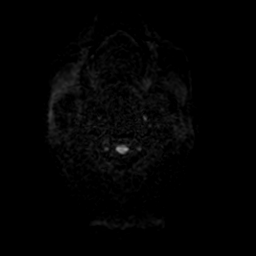
[im 23/112]
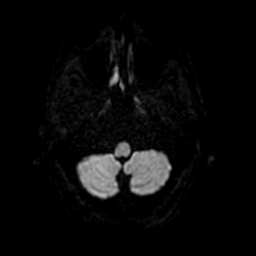
[im 45/112]
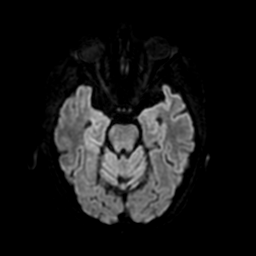
[im 67/112]
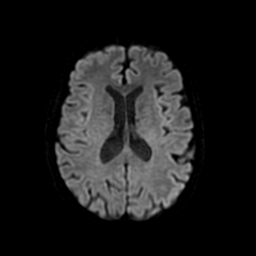
[im 89/112]
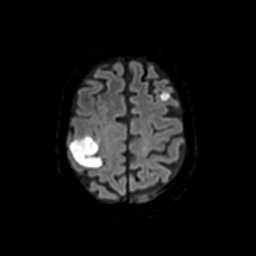
[im 112/112]
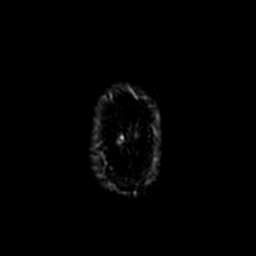

[Series 4: T2 · axial · 5.0mm · 0.47mm/px · z∈[-50,+109]mm · 2 of 28 slices shown]
[im 1/28]
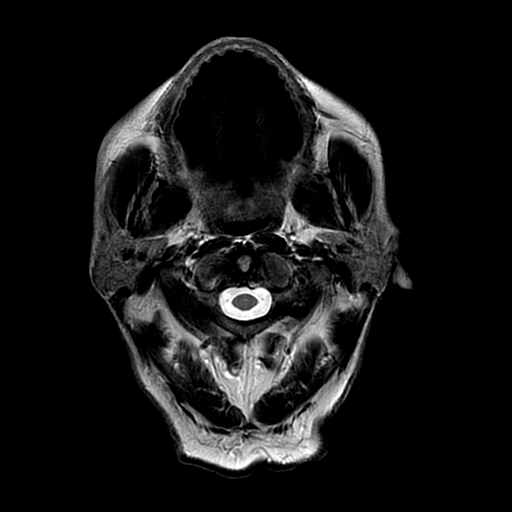
[im 28/28]
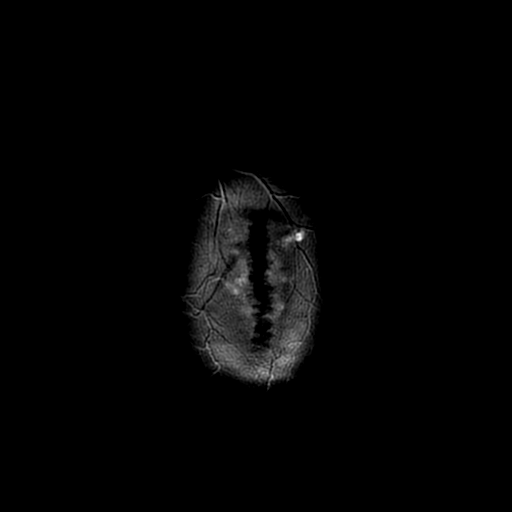

[Series 5: FLAIR · axial · 3.0mm · 0.47mm/px · z∈[-56,+106]mm · 3 of 55 slices shown (2 of 2)]
[im 1/55]
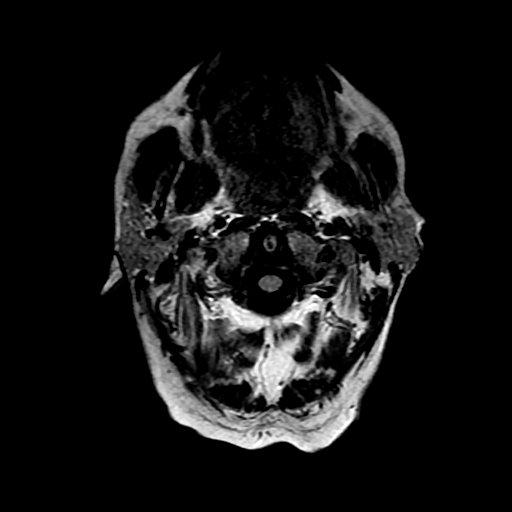
[im 28/55]
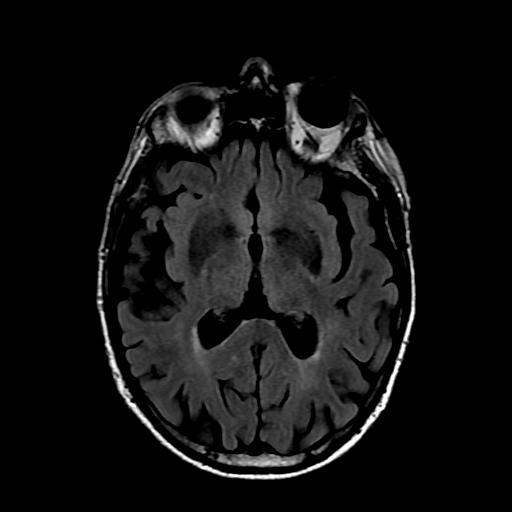
[im 55/55]
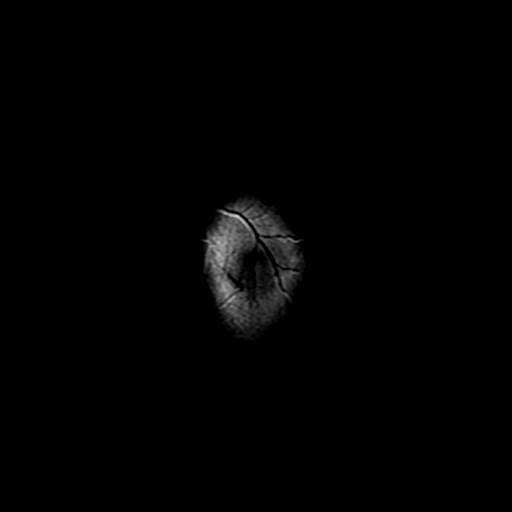

[Series 8: T2 post-contrast · coronal · 3.0mm · 0.39mm/px · 3 of 45 slices shown]
[im 1/45]
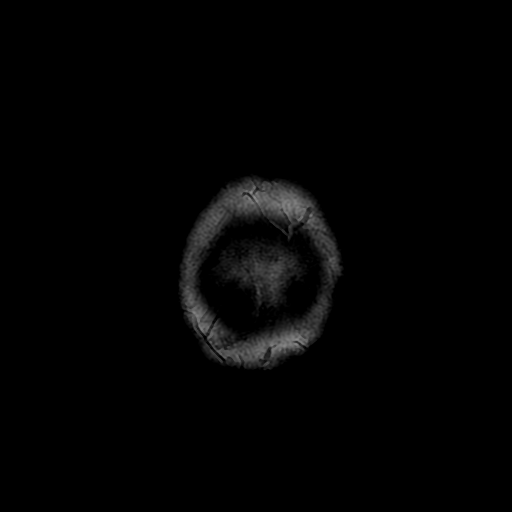
[im 23/45]
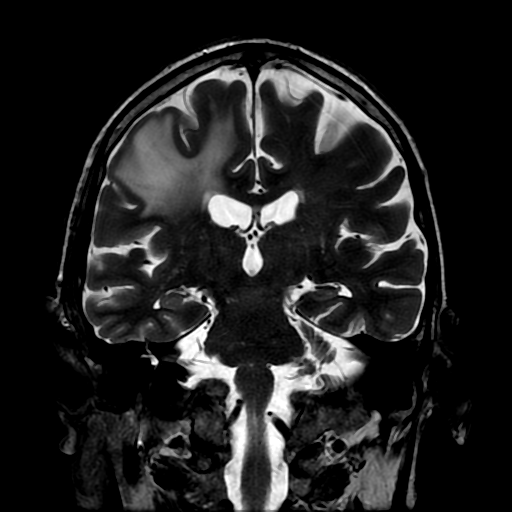
[im 45/45]
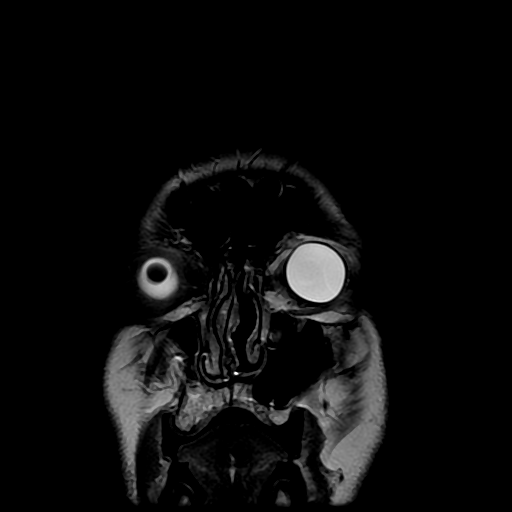

[Series 10: T1 post-contrast · coronal · 3.0mm · 0.43mm/px · 3 of 45 slices shown]
[im 1/45]
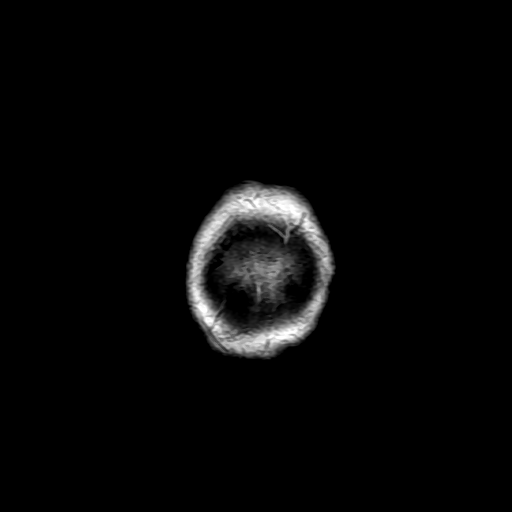
[im 23/45]
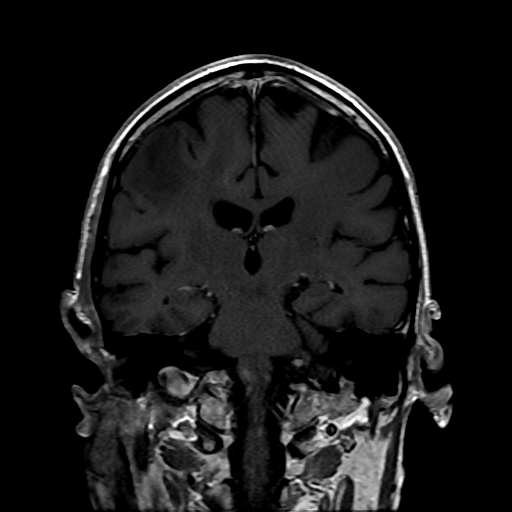
[im 45/45]
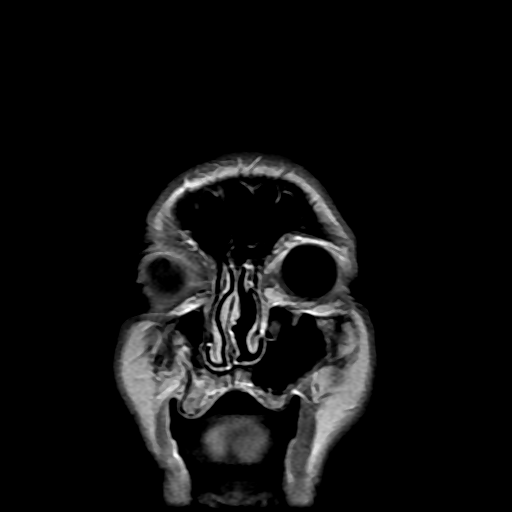

[Series 11: FLAIR post-contrast · sagittal · 3.0mm · 0.47mm/px · 2 of 36 slices shown]
[im 1/36]
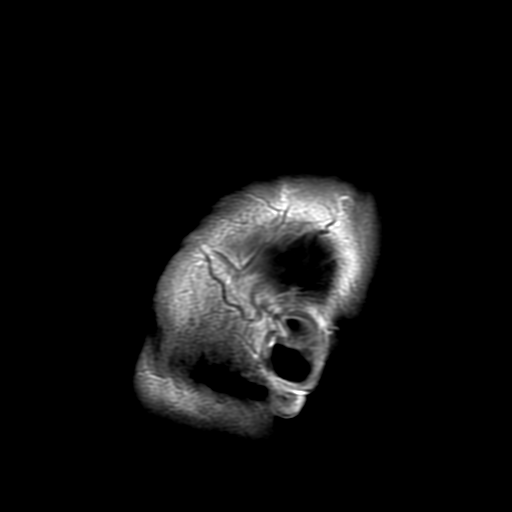
[im 36/36]
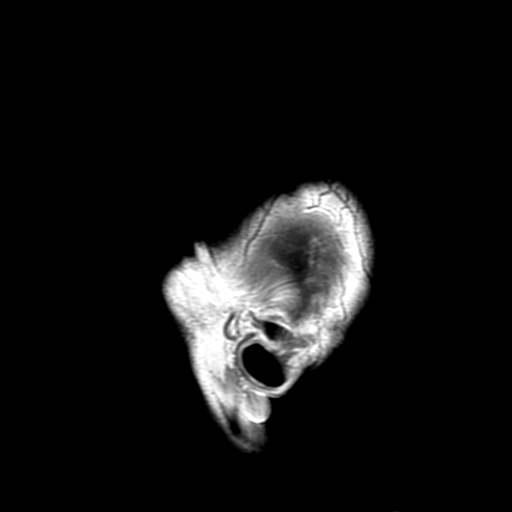

[Series 350: ADC · axial · 3.0mm · 0.94mm/px · z∈[-51,+110]mm · 3 of 56 slices shown]
[im 1/56]
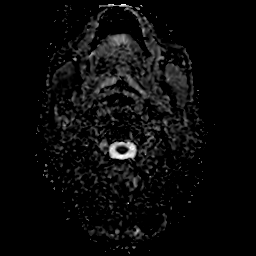
[im 28/56]
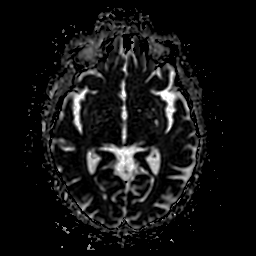
[im 56/56]
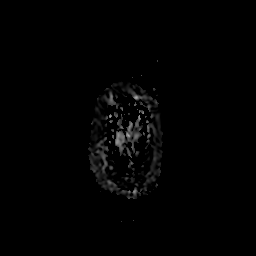

[24 of 48 positions shown; findings below may reference images not displayed]

FINDINGS: Brain: On today's exam the multiple scattered enhancing brain
lesions are in some ways most conspicuous on DWI (for example series
3, image 36).

The largest mass is redemonstrated in the right posterior frontal
and anterior parietal lobes measuring up to 37 millimeters diameter
(series 9, image 118 and series 10, image 31).

The smallest lesion is in the left periatrial white matter measuring
3-4 millimeters.

A total of 6 lesions are identified which are T1 hypointense, T2
hyperintense, and demonstrate enhancement which is most conspicuous
on series 10. Each of these has not significantly changed in size or
configuration since [DATE]. All are annotated on series 9.

No new lesion is identified. No dural enhancement. No hemosiderin is
associated with the enhancing lesions, although there are a few
scattered chronic microhemorrhages (series 6, image 80, 65).

T2 and FLAIR hyperintensity in keeping with vasogenic edema is most
pronounced in the superior right hemisphere and stable to mildly
improved since [REDACTED]. No significant intracranial mass effect.
There is also mild to moderate edema in the left superior frontal
gyrus and the left parietal lobe.

No superimposed restricted diffusion suggestive of acute infarction.
No ventriculomegaly, extra-axial collection or acute intracranial
hemorrhage. Cervicomedullary junction and pituitary are within
normal limits. No new gray or white matter signal abnormality.

Vascular: Major intracranial vascular flow voids are stable. The
major dural venous sinuses are enhancing and appear to be patent.

Skull and upper cervical spine: Negative visible cervical spine and
spinal cord. Visualized bone marrow signal is within normal limits.

Sinuses/Orbits: Negative orbits. Trace paranasal sinus mucosal
thickening is stable.

Other: Mastoids remain clear. Visible internal auditory structures
appear normal.
IMPRESSION: 1. Six brain lesions ranging from 4 mm to 37 mm diameter have not
significantly changed since [DATE] and are most compatible with
metastases.
Lesion enhancement is most apparent today on series 10, and the
lesions are also highly conspicuous on DWI.
Associated vasogenic edema is stable to mildly improved. No
significant intracranial mass effect.

2. No new metastatic disease or new intracranial abnormality
identified.

## 2019-06-14 MED ORDER — GADOBUTROL 1 MMOL/ML IV SOLN
6.0000 mL | Freq: Once | INTRAVENOUS | Status: AC | PRN
  Administered 2019-06-14: 6 mL via INTRAVENOUS

## 2019-06-14 NOTE — Progress Notes (Signed)
Called Walter Hernandez to make sure she received the information from pathology. She has received the information and is relieved that the patient does not need a craniotomy.   She reports that the patient is more comfortable, and feels like his urination is much easier without problems. She is worried about the full day they have Monday, but feels like they can rest over the weekend and be ready for it.   She has no further questions or concerns.

## 2019-06-14 NOTE — Progress Notes (Signed)
[facility] testing request sent on specimen [id number] (DOS 06/11/19)

## 2019-06-17 ENCOUNTER — Inpatient Hospital Stay
Admission: RE | Admit: 2019-06-17 | Discharge: 2019-06-17 | Disposition: A | Discharge status: Home / Self Care | Payer: Self-pay | Source: Ambulatory Visit | Attending: Radiation Oncology | Admitting: Radiation Oncology

## 2019-06-17 ENCOUNTER — Ambulatory Visit
Admission: RE | Admit: 2019-06-17 | Discharge: 2019-06-17 | Disposition: A | Discharge status: Home / Self Care | Payer: Medicare Other | Source: Ambulatory Visit | Attending: Radiation Oncology | Admitting: Radiation Oncology

## 2019-06-17 ENCOUNTER — Other Ambulatory Visit: Payer: Self-pay

## 2019-06-17 ENCOUNTER — Encounter (HOSPITAL_COMMUNITY)
Admission: RE | Admit: 2019-06-17 | Discharge: 2019-06-17 | Disposition: A | Discharge status: Home / Self Care | Payer: Medicare Other | Source: Ambulatory Visit | Attending: Hematology & Oncology | Admitting: Hematology & Oncology

## 2019-06-17 ENCOUNTER — Ambulatory Visit: Payer: Medicare Other | Admitting: Radiation Oncology

## 2019-06-17 ENCOUNTER — Ambulatory Visit: Admission: RE | Admit: 2019-06-17 | Payer: Medicare Other | Source: Ambulatory Visit

## 2019-06-17 VITALS — BP 139/88 | HR 108 | Temp 97.3°F | Resp 20 | Wt 143.2 lb

## 2019-06-17 DIAGNOSIS — N400 Enlarged prostate without lower urinary tract symptoms: Secondary | ICD-10-CM | POA: Diagnosis not present

## 2019-06-17 DIAGNOSIS — Z510 Encounter for antineoplastic radiation therapy: Secondary | ICD-10-CM | POA: Insufficient documentation

## 2019-06-17 DIAGNOSIS — Z85820 Personal history of malignant melanoma of skin: Secondary | ICD-10-CM | POA: Diagnosis not present

## 2019-06-17 DIAGNOSIS — C7989 Secondary malignant neoplasm of other specified sites: Secondary | ICD-10-CM | POA: Insufficient documentation

## 2019-06-17 DIAGNOSIS — C3491 Malignant neoplasm of unspecified part of right bronchus or lung: Secondary | ICD-10-CM | POA: Diagnosis not present

## 2019-06-17 DIAGNOSIS — C787 Secondary malignant neoplasm of liver and intrahepatic bile duct: Secondary | ICD-10-CM | POA: Diagnosis not present

## 2019-06-17 DIAGNOSIS — T380X5A Adverse effect of glucocorticoids and synthetic analogues, initial encounter: Secondary | ICD-10-CM

## 2019-06-17 DIAGNOSIS — Z79899 Other long term (current) drug therapy: Secondary | ICD-10-CM | POA: Diagnosis not present

## 2019-06-17 DIAGNOSIS — F17210 Nicotine dependence, cigarettes, uncomplicated: Secondary | ICD-10-CM | POA: Diagnosis not present

## 2019-06-17 DIAGNOSIS — C7931 Secondary malignant neoplasm of brain: Secondary | ICD-10-CM | POA: Diagnosis not present

## 2019-06-17 DIAGNOSIS — R590 Localized enlarged lymph nodes: Secondary | ICD-10-CM | POA: Insufficient documentation

## 2019-06-17 DIAGNOSIS — C3490 Malignant neoplasm of unspecified part of unspecified bronchus or lung: Secondary | ICD-10-CM | POA: Diagnosis not present

## 2019-06-17 DIAGNOSIS — Z85118 Personal history of other malignant neoplasm of bronchus and lung: Secondary | ICD-10-CM | POA: Diagnosis not present

## 2019-06-17 LAB — GLUCOSE, CAPILLARY: Glucose-Capillary: 123 mg/dL — ABNORMAL HIGH (ref 70–99)

## 2019-06-17 IMAGING — PT NM PET TUM IMG INITIAL (PI) SKULL BASE T - THIGH
7 series · 25 of 25 positions shown · non-contrast
Comparison: CT scan [DATE]

CLINICAL DATA: Initial treatment strategy for metastatic small cell
lung cancer.

EXAM:
NUCLEAR MEDICINE PET SKULL BASE TO THIGH
TECHNIQUE: 7.1 mCi F-18 FDG was injected intravenously. Full-ring PET imaging
was performed from the skull base to thigh after the radiotracer. CT
data was obtained and used for attenuation correction and anatomic
localization.
Fasting blood glucose: 123 mg/dl

[Series 3: pet sk_thigh ac · axial · 5.0mm · 4.07mm/px · z∈[-957,-61]mm · 6 of 225 slices shown]
[im 1/225]
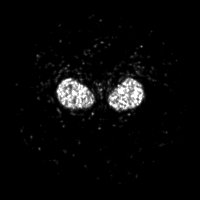
[im 45/225]
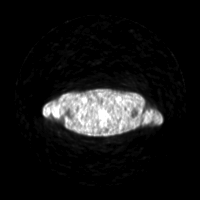
[im 90/225]
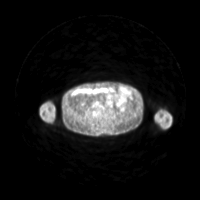
[im 135/225]
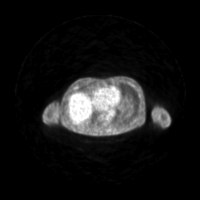
[im 180/225]
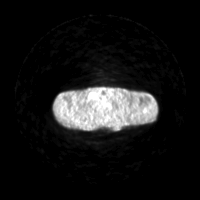
[im 225/225]
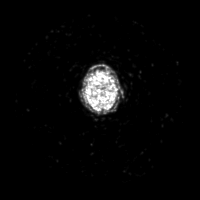

[Series 4: ct sk_thigh 5.0 b31f · axial · 5.0mm · 0.98mm/px · z∈[-957,-61]mm · 5 of 225 slices shown]
[im 1/225]
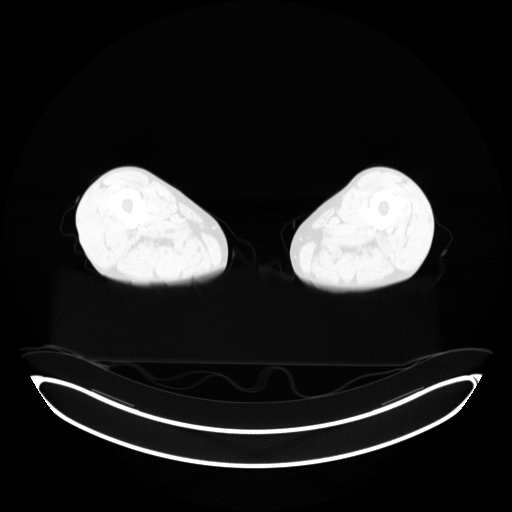
[im 57/225]
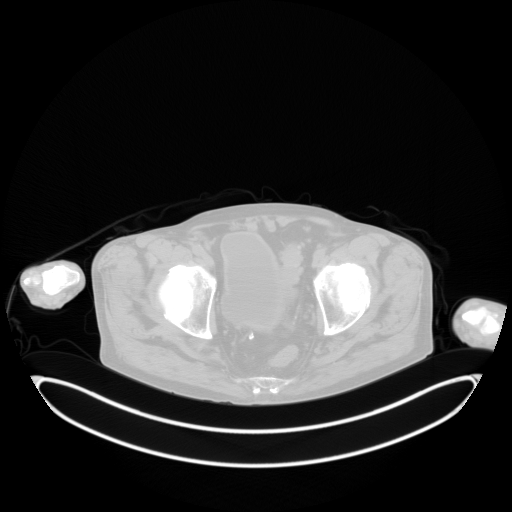
[im 113/225]
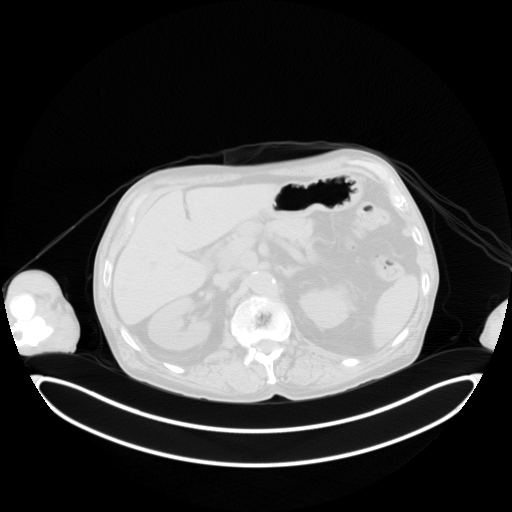
[im 169/225]
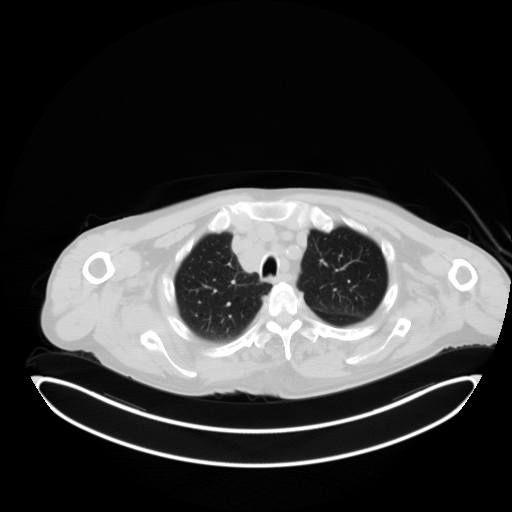
[im 225/225  brain]
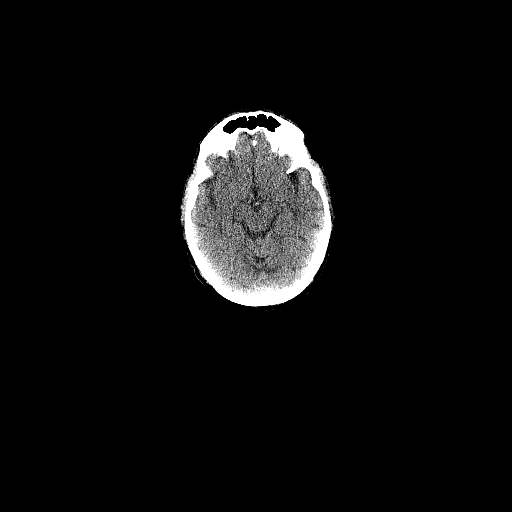

[Series 5: pet sk_thigh nac · axial · 5.0mm · 4.07mm/px · z∈[-957,-61]mm · 5 of 225 slices shown]
[im 1/225]
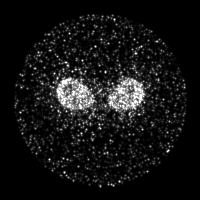
[im 57/225]
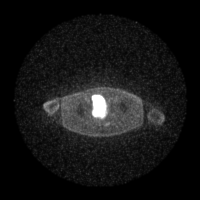
[im 113/225]
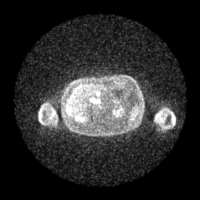
[im 169/225]
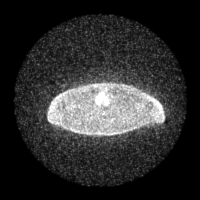
[im 225/225]
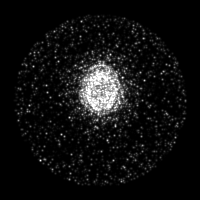

[Series 8: ct sk_thigh 5.0 b70f lung_bone · axial · 5.0mm · 0.66mm/px · 1 of 64 slices shown]
[im 1/64  bone]
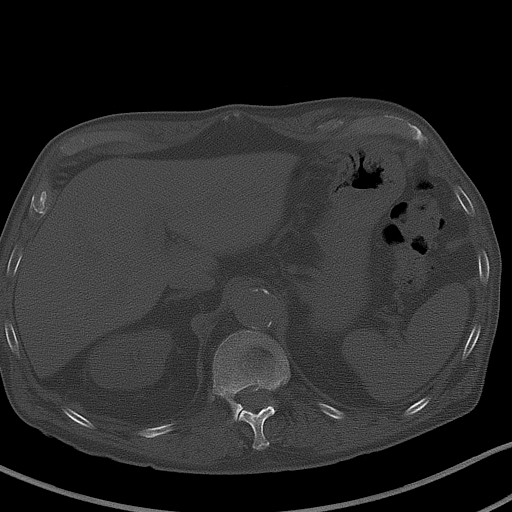

[Series 603: range-ct sk_thigh 5.0 (id)<alpha range> · 2 of 77 slices shown (1 of 2)]
[im 1/77]
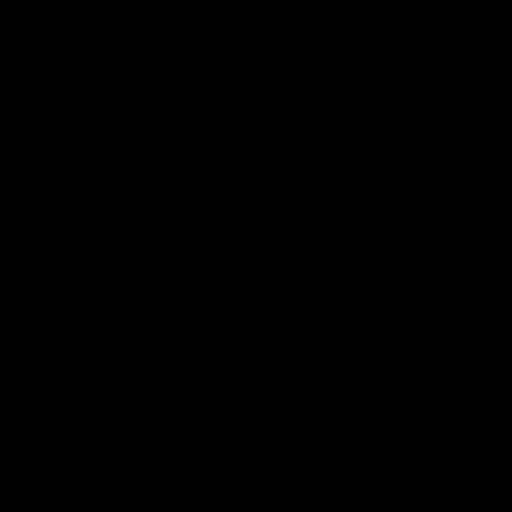
[im 77/77]
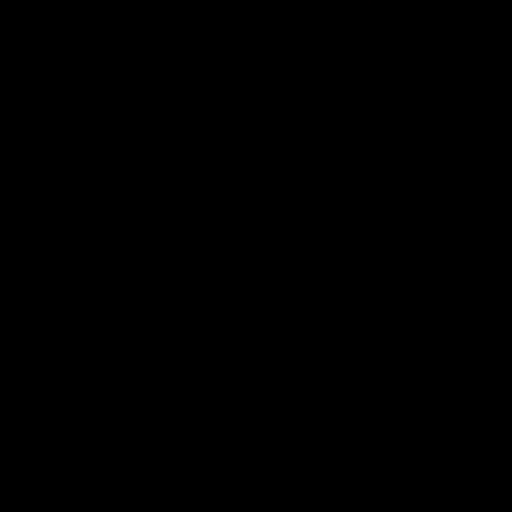

[Series 604: mip range 2 · coronal · 1.86mm/px · 1 of 32 slices shown]
[im 1/32]
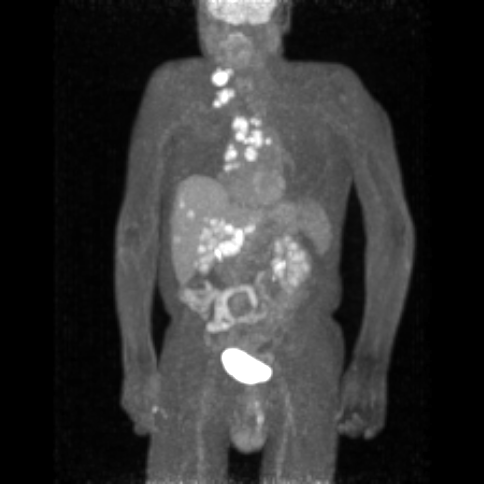

[Series 605: range-ct sk_thigh 5.0 (id)<alpha range> · 5 of 220 slices shown (2 of 2)]
[im 1/220]
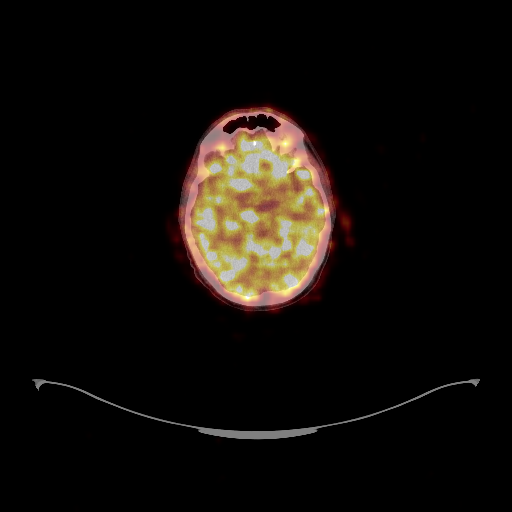
[im 55/220]
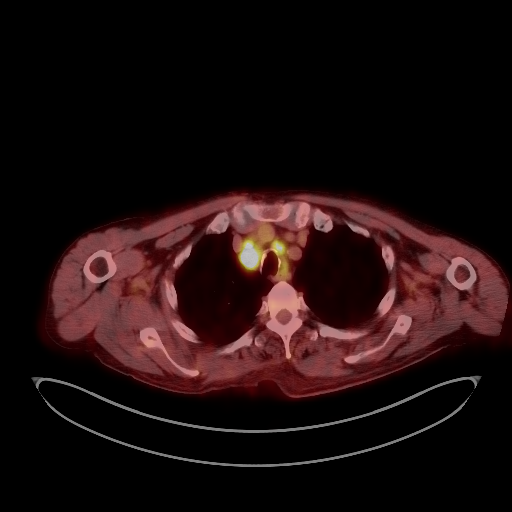
[im 110/220]
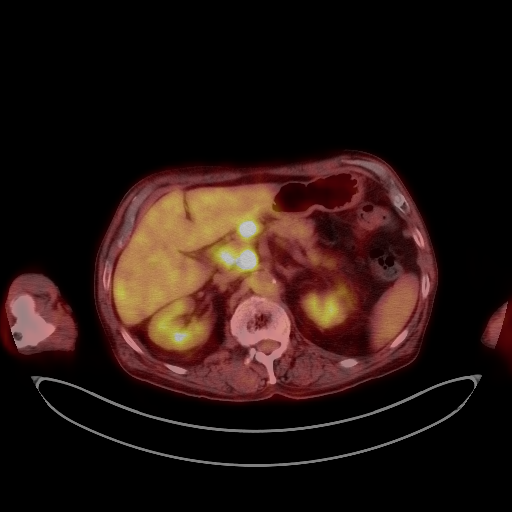
[im 165/220]
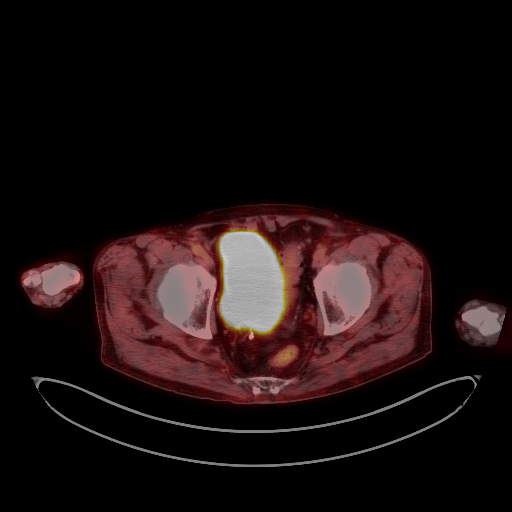
[im 220/220]
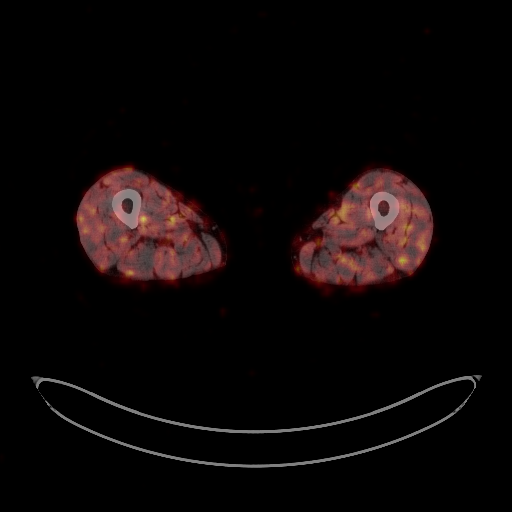

[25 of 25 positions shown; findings below may reference images not displayed]

FINDINGS: Mediastinal blood pool activity: SUV max

Liver activity: SUV max NA

NECK: 2.2 cm nodal lesion in the supraclavicular fossa on the right
side is hypermetabolic with SUV max of 12.27.

Right-sided Retroclavicular lymph node measures 18.5 mm and has an
SUV max of 10.56.

Incidental CT findings: Advanced carotid artery calcifications.

CHEST: Bulky hypermetabolic mediastinal and hilar lymphadenopathy.

Hypermetabolic prevascular lymph nodes have an SUV max of [REDACTED] mm right paratracheal node has an SUV max of 10.39.

2 cm AP window node has an SUV max of 9.32.

Subcarinal adenopathy has an SUV max of [REDACTED] mm right hilar lymph node has an SUV max of 10.31.

9 mm right lower lobe pulmonary nodule is not definitely
hypermetabolic and could be a metastatic focus. Difficult to
identify the primary lung lesion. It could be in the azygoesophageal
recess of the right lower lobe blending in with the subcarinal
adenopathy.

Incidental CT findings: none

ABDOMEN/PELVIS: There are 4 small hypermetabolic metastatic hepatic
lesions. The segment 8 lesion measures 13 mm and has an SUV max of
6.09.

Enlarged hypermetabolic upper abdominal lymphadenopathy.

17 mm celiac axis lymph node has an SUV max of [REDACTED] mm periportal adenopathy has an SUV max of 10.68.

Incidental CT findings: No retroperitoneal or pelvic
lymphadenopathy. No evidence of adrenal gland metastatic disease.

SKELETON: No findings suspicious for osseous metastatic disease.

Incidental CT findings: none
IMPRESSION: 1. Enlarged and hypermetabolic right supraclavicular, mediastinal
and hilar lymphadenopathy. The primary lung lesion is not identified
for certain as detailed above. Possible primary lesions in the right
lower lobe.
2. Metastatic hepatic disease and upper abdominal metastatic
adenopathy.
3. No findings for osseous metastatic disease.

## 2019-06-17 MED ORDER — MEMANTINE HCL 5 MG PO TABS: ORAL_TABLET | ORAL | 0 refills | Status: DC

## 2019-06-17 MED ORDER — DEXAMETHASONE 4 MG PO TABS: 4.0000 mg | ORAL_TABLET | Freq: Two times a day (BID) | ORAL | 0 refills | Status: DC

## 2019-06-17 MED ORDER — LORAZEPAM 0.5 MG PO TABS: ORAL_TABLET | ORAL | 0 refills | Status: DC

## 2019-06-17 MED ORDER — LORAZEPAM 0.5 MG PO TABS
0.5000 mg | ORAL_TABLET | ORAL | Status: AC
  Administered 2019-06-17: 0.5 mg via ORAL
  Filled 2019-06-17: qty 1

## 2019-06-17 MED ORDER — FLUDEOXYGLUCOSE F - 18 (FDG) INJECTION
7.1000 | Freq: Once | INTRAVENOUS | Status: AC
  Administered 2019-06-17: 7.1 via INTRAVENOUS

## 2019-06-17 NOTE — Progress Notes (Signed)
Radiation Oncology         [phone] ________________________________  Follow Up New Visit.  Name: Walter Hernandez MRN: [id number]  Date of Service: 06/17/2019 DOB: 1950-12-12  YB:[id number], Jenny Reichmann, MD  Volanda Napoleon, MD   REFERRING PHYSICIAN: Volanda Napoleon, MD  DIAGNOSIS: The primary encounter diagnosis was Extensive stage primary small cell carcinoma of lung ([place]). Diagnoses of History of melanoma, Adverse effect of corticosteroids, initial encounter, and Brain metastases [facility]) were also pertinent to this visit.    ICD-10-CM   1. Extensive stage primary small cell carcinoma of lung (HCC)  C34.90 LORazepam (ATIVAN) tablet 0.5 mg  2. History of melanoma  Z85.820   3. Adverse effect of corticosteroids, initial encounter  T38.0X5A   4. Brain metastases (HCC)  C79.31     HISTORY OF PRESENT ILLNESS: Walter Hernandez is a 69 y.o. male seen at the request of Dr. Marin Olp  with a history of Stage Ib melanoma of the left lower leg. In December 2020, he developed weakness in his left arm which worsened and he noted progression into his left leg for approximately 2 days prior to admission on 06/03/2019, and he ultimately presented to the ED for evaluation. There, a CT head revealed pronounced abnormality of the right frontoparietal region with extensive vasogenic edema and probable abnormal cortical thickening and hyperdensity with a smaller area of edema within the left posterior and frontal area felt most likely to represent occult metastatic disease.   An MRI of the brain revealed multiple enhancing mass lesions including a 35 mm mass in the right posterior frontal lobe, a 6 mm right frontal lobe lesion, an 11 mm left frontal lobe lesion, and multiple other lesions in the cerebrum with surrounding edema compatible with metastatic disease. He was started on decadron 20mg  IV daily.    He underwent CT C/A/P for disease staging following the brain MRI and this exam revealed extensive mediastinal and  hilar adenopathy, bilateral lung nodules, Porta hepatis adenopathy, including a nodal mass of 2.6 x 3.1 cm and some vague hypoattenuating bilateral liver lesions. He underwent bronchoscopy with EBUS on 06/05/2019 with Dr. Ina Homes, and his 4L node revealed atypical cells were present, but limited lymphoid material was noted in node 7 and the 11R node. He underwent repeat bronchoscopy with EBUS on 06/11/19 and in his 4L node small cell carcinoma was noted, and level 7 node also contained small cell carcinoma. He was getting set up for probable SRS treatment, however with this histological finding, he is rather a candidate for whole brain radiotherapy. He is seen today via Webex to discuss treatment options.     PREVIOUS RADIATION THERAPY: No  PAST MEDICAL HISTORY:  Past Medical History:  Diagnosis Date  . Cancer [facility])    recently discovered melanoma  . GERD (gastroesophageal reflux disease)   . History of hiatal hernia   . Hypertension    recently placed on new blood pressure med  . Malignant melanoma of skin of left lower leg ([place]) 07/28/2015      PAST SURGICAL HISTORY: Past Surgical History:  Procedure Laterality Date  . BRONCHIAL NEEDLE ASPIRATION BIOPSY  06/05/2019   Procedure: Bronchial Needle Aspiration Biopsies;  Surgeon: Candee Furbish, MD;  Location: [place];  Service: Thoracic;;  . CARDIAC CATHETERIZATION     15 yrs ago...came out normal   -- hiatal hernia  . ENDOBRONCHIAL ULTRASOUND N/A 06/11/2019   Procedure: ENDOBRONCHIAL ULTRASOUND;  Surgeon: Garner Nash, DO;  Location: [place];  Service: Endoscopy;  Laterality: N/A;  . FINE NEEDLE ASPIRATION  06/11/2019   Procedure: FINE NEEDLE ASPIRATION (FNA) LINEAR;  Surgeon: Garner Nash, DO;  Location: [place];  Service: Endoscopy;;  . MELANOMA EXCISION WITH SENTINEL LYMPH NODE BIOPSY Left 09/29/2014   Procedure: WIDE EXCISION MELANOMA LEFT CALF WITH LEFT INGUINAL  SENTINEL LYMPH NODE BIOPSY;  Surgeon: Georganna Skeans,  MD;  Location: [place];  Service: General;  Laterality: Left;  . skin lesion excised     non cancerous  . VIDEO BRONCHOSCOPY N/A 06/11/2019   Procedure: VIDEO BRONCHOSCOPY WITHOUT FLUORO;  Surgeon: Garner Nash, DO;  Location: [place];  Service: Endoscopy;  Laterality: N/A;  . VIDEO BRONCHOSCOPY WITH ENDOBRONCHIAL ULTRASOUND N/A 06/05/2019   Procedure: VIDEO BRONCHOSCOPY WITH ENDOBRONCHIAL ULTRASOUND;  Surgeon: Candee Furbish, MD;  Location: [place];  Service: Thoracic;  Laterality: N/A;    FAMILY HISTORY: No family history on file.  SOCIAL HISTORY:  Social History   Socioeconomic History  . Marital status: Married    Spouse name: Not on file  . Number of children: Not on file  . Years of education: Not on file  . Highest education level: Not on file  Occupational History  . Not on file  Tobacco Use  . Smoking status: Current Every Day Smoker    Packs/day: 1.00    Years: 50.00    Pack years: 50.00    Types: Cigarettes  . Smokeless tobacco: Never Used  . Tobacco comment: has quit for about 1 week is using the patch  Substance and Sexual Activity  . Alcohol use: Not on file    Comment: socially....beer  wine & liquor  . Drug use: No  . Sexual activity: Not on file  Other Topics Concern  . Not on file  Social History Narrative  . Not on file   Social Determinants of Health   Financial Resource Strain:   . Difficulty of Paying Living Expenses: Not on file  Food Insecurity:   . Worried About Charity fundraiser in the Last Year: Not on file  . Ran Out of Food in the Last Year: Not on file  Transportation Needs:   . Lack of Transportation (Medical): Not on file  . Lack of Transportation (Non-Medical): Not on file  Physical Activity:   . Days of Exercise per Week: Not on file  . Minutes of Exercise per Session: Not on file  Stress:   . Feeling of Stress : Not on file  Social Connections:   . Frequency of Communication with Friends and Family: Not on file  .  Frequency of Social Gatherings with Friends and Family: Not on file  . Attends Religious Services: Not on file  . Active Member of Clubs or Organizations: Not on file  . Attends Archivist Meetings: Not on file  . Marital Status: Not on file  Intimate Partner Violence:   . Fear of Current or Ex-Partner: Not on file  . Emotionally Abused: Not on file  . Physically Abused: Not on file  . Sexually Abused: Not on file  The paitent is married and lives in [place]. He's recently retired.   ALLERGIES: Patient has no known allergies.  MEDICATIONS:  Current Outpatient Medications  Medication Sig Dispense Refill  . acetaminophen (TYLENOL) 500 MG tablet Take 1,000 mg by mouth every 6 (six) hours as needed for moderate pain.    Marland Kitchen ALPRAZolam (XANAX) 0.5 MG tablet Take 1 tablet (0.5 mg total) by mouth  every 6 (six) hours as needed for anxiety. 60 tablet 0  . amLODipine (NORVASC) 5 MG tablet Take 5 mg by mouth daily.     Marland Kitchen dexamethasone (DECADRON) 4 MG tablet Take 1 tablet (4 mg total) by mouth 2 (two) times daily. 60 tablet 0  . fluconazole (DIFLUCAN) 100 MG tablet Take 1 tablet (100 mg total) by mouth daily. 14 tablet 0  . IBUPROFEN & ACETAMINOPHEN PO Take 1-2 tablets by mouth every 6 (six) hours as needed (pain).    . LORazepam (ATIVAN) 0.5 MG tablet 1 tablet po 30 minutes prior to radiation or MRI 30 tablet 0  . memantine (NAMENDA) 5 MG tablet Starting 06/18/19 Take one tab po daily. Starting 06/25/19 Take one tab po q am and one tab po q pm. Starting 1/26/21Take two tabs po q am, and one tab po q pm. Starting 07/09/19 Take two tabs po q am and two tabs po q pm until 12/03/19. 602 tablet 0  . Multiple Vitamin (MULTIVITAMIN) capsule Take 1 capsule by mouth daily.    . Multiple Vitamins-Minerals (EMERGEN-C IMMUNE PLUS) PACK Take 1 Package by mouth daily as needed (immune system).    . Naphazoline-Pheniramine (OPCON-A OP) Place 2 drops into both eyes as needed (for dry eyes).    . nicotine  (NICODERM CQ - DOSED IN MG/24 HOURS) 21 mg/24hr patch Place 1 patch (21 mg total) onto the skin daily. 28 patch 0  . omeprazole (PRILOSEC) 40 MG capsule Take 40 mg by mouth daily.    . ondansetron (ZOFRAN) 4 MG tablet Take 1 tablet (4 mg total) by mouth every 6 (six) hours as needed for nausea. 20 tablet 0  . tamsulosin (FLOMAX) 0.4 MG CAPS capsule Take 2 capsules (0.8 mg total) by mouth daily. 60 capsule 1   No current facility-administered medications for this encounter.    REVIEW OF SYSTEMS:  On review of systems, the patient reports that he is doing pretty well. His weakness in his arm and leg have improved. He does have a hard time sleeping at night and also with emptying his bladder at night. He has been taking a higher dose of flomax this week with minimal effect. He denies any chest pain, shortness of breath, cough, fevers, chills, night sweats, unintended weight changes. He denies any bowel disturbances, and denies abdominal pain, nausea or vomiting. He denies any new musculoskeletal or joint aches or pains. A complete review of systems is obtained and is otherwise negative.    PHYSICAL EXAM:  Wt Readings from Last 3 Encounters:  06/17/19 143 lb 3.2 oz (65 kg)  06/11/19 142 lb (64.4 kg)  06/03/19 132 lb 8 oz (60.1 kg)   Temp Readings from Last 3 Encounters:  06/17/19 (!) 97.3 F (36.3 C)  06/11/19 97.7 F (36.5 C)  06/05/19 97.8 F (36.6 C)   BP Readings from Last 3 Encounters:  06/17/19 139/88  06/11/19 103/67  06/05/19 115/70   Pulse Readings from Last 3 Encounters:  06/17/19 (!) 108  06/11/19 94  06/05/19 68    In general this is a well appearing caucasian male in no acute distress. He's alert and oriented x4 and appropriate throughout the examination. Cardiopulmonary assessment is negative for acute distress and he exhibits normal effort. No focal neurologic changes are noted.    KPS = 90  100 - Normal; no complaints; no evidence of disease. 90   - Able to  carry on normal activity; minor signs or symptoms of disease. 80   -  Normal activity with effort; some signs or symptoms of disease. 80   - Cares for self; unable to carry on normal activity or to do active work. 60   - Requires occasional assistance, but is able to care for most of his personal needs. 50   - Requires considerable assistance and frequent medical care. 78   - Disabled; requires special care and assistance. 28   - Severely disabled; hospital admission is indicated although death not imminent. 19   - Very sick; hospital admission necessary; active supportive treatment necessary. 10   - Moribund; fatal processes progressing rapidly. 0     - Dead  Karnofsky DA, Abelmann [place], Craver LS and Burchenal JH [phone]) The use of the nitrogen mustards in the palliative treatment of carcinoma: with particular reference to bronchogenic carcinoma Cancer 1 634-56  LABORATORY DATA:  Lab Results  Component Value Date   WBC 19.0 (H) 06/05/2019   HGB 15.2 06/05/2019   HCT 42.5 06/05/2019   MCV 95.7 06/05/2019   PLT 282 06/05/2019   Lab Results  Component Value Date   NA 136 06/05/2019   K 3.8 06/05/2019   CL 101 06/05/2019   CO2 24 06/05/2019   Lab Results  Component Value Date   ALT 14 06/05/2019   AST 23 06/05/2019   ALKPHOS 63 06/05/2019   BILITOT 0.6 06/05/2019     RADIOGRAPHY: CT HEAD WO CONTRAST  Result Date: 06/03/2019 CLINICAL DATA:  Left arm weakness over the last week. EXAM: CT HEAD WITHOUT CONTRAST TECHNIQUE: Contiguous axial images were obtained from the base of the skull through the vertex without intravenous contrast. COMPARISON:  None. FINDINGS: Brain: No abnormality is seen affecting the brainstem or cerebellum. Left cerebral hemisphere shows subcortical white matter edema in the left posterior parietal region and at the left frontal vertex. Right cerebral hemisphere shows a large region of vasogenic edema within the right frontoparietal white matter with intact  overlying cortex. Probable cortical thickening. These findings are not typical of ordinary infarctions and raise the possibility of either metastatic disease, multifocal primary brain malignancy or lymphoma. Cerebritis could also be possible. Recommend MRI with and without contrast when able. No hydrocephalus. No midline shift. No extra-axial fluid collection. Vascular: There is atherosclerotic calcification of the major vessels at the base of the brain. Skull: Negative Sinuses/Orbits: Clear/normal Other: None IMPRESSION: Pronounced abnormality of the right frontoparietal region with extensive vasogenic white matter edema and probable abnormal cortical thickening and hyperdensity. Smaller area of vasogenic edema within the left posterior parietal subcortical white matter and the left frontal subcortical white matter. Most likely diagnosis is occult metastatic disease. Multifocal primary brain malignancy and lymphoma are also possible. Lastly, septic emboli/cerebritis are considered. Recommend brain MRI with and without contrast. Electronically Signed   By: Nelson Chimes M.D.   On: 06/03/2019 13:24   CT Chest W Contrast  Result Date: 06/03/2019 CLINICAL DATA:  Brain MR demonstrating metastatic disease. History of melanoma 4 years ago. EXAM: CT CHEST, ABDOMEN, AND PELVIS WITH CONTRAST TECHNIQUE: Multidetector CT imaging of the chest, abdomen and pelvis was performed following the standard protocol during bolus administration of intravenous contrast. CONTRAST:  122mL OMNIPAQUE IOHEXOL 300 MG/ML  SOLN COMPARISON:  Chest radiograph report 04/29/99 brain MR of earlier today. FINDINGS: CT CHEST FINDINGS Cardiovascular: Mild motion degradation involving the upper and mid chest. Aortic and branch vessel atherosclerosis. Tortuous thoracic aorta. Normal heart size, without pericardial effusion. No central pulmonary embolism, on this non-dedicated study. Mediastinum/Nodes: Extensive mediastinal adenopathy.  Right  paratracheal node measures 1.6 cm on 18/3. Subcarinal node measures 1.5 cm on 29/3. Right hilar adenopathy at 1.6 cm on 30/3. Prevascular node of 1.0 cm on 22/3. Lungs/Pleura: No pleural fluid. Bronchial wall thickening is most significant in the right middle and right lower lobes, including on 82/5. Centrilobular emphysema. Right middle lobe 5 mm pulmonary nodule on 96/5. Right lower lobe 1.0 cm nodule including on 84/5. Likely calcified 2 mm posterior right upper lobe pulmonary nodule on 63/5. Musculoskeletal: No acute osseous abnormality. CT ABDOMEN PELVIS FINDINGS Hepatobiliary: Vague hypoattenuating liver lesions bilaterally. Example in the right hepatic lobe at 8 mm on 54/3 and within the lateral segment left liver lobe 1.1 cm on 57/3. Normal gallbladder, without biliary ductal dilatation. Pancreas: Normal, without mass or ductal dilatation. Spleen: Normal in size, without focal abnormality. Adrenals/Urinary Tract: Normal adrenal glands. Normal left kidney. Too small to characterize interpolar right renal lesion. No hydronephrosis. Bladder wall irregularity with small saccules posteriorly. Stomach/Bowel: Proximal gastric underdistention. Scattered colonic diverticula. Normal terminal ileum. Normal small bowel. Vascular/Lymphatic: Advanced aortic and branch vessel atherosclerosis. Porta hepatis adenopathy, including a nodal mass of 2.6 x 3.1 cm on 66/3. No pelvic sidewall adenopathy. Reproductive: Mild prostatomegaly. Other: No significant free fluid. No evidence of omental or peritoneal disease. Musculoskeletal: Thoracolumbar spondylosis. S-shaped thoracolumbar spine curvature. IMPRESSION: 1. Thoracoabdominal adenopathy and liver lesions, favored to represent metastatic disease. Given emphysema and small pulmonary nodules, differential considerations include metastatic bronchogenic carcinoma and melanoma. 2. Motion degraded evaluation of the upper chest. 3. Aortic atherosclerosis (ICD10-I70.0) and emphysema  (ICD10-J43.9). 4. Prostatomegaly with a component of bladder outlet obstruction. Electronically Signed   By: Abigail Miyamoto M.D.   On: 06/03/2019 20:02   MR Brain W Wo Contrast  Result Date: 06/14/2019 CLINICAL DATA:  69 year old male with metastatic disease to the brain. History of melanoma 4 years ago. Stereotactic treatment planning. EXAM: MRI HEAD WITHOUT AND WITH CONTRAST TECHNIQUE: Multiplanar, multiecho pulse sequences of the brain and surrounding structures were obtained without and with intravenous contrast. CONTRAST:  24mL GADAVIST GADOBUTROL 1 MMOL/ML IV SOLN COMPARISON:  Brain MRI 06/03/2019 and earlier. FINDINGS: Brain: On today's exam the multiple scattered enhancing brain lesions are in some ways most conspicuous on DWI (for example series 3, image 36). The largest mass is redemonstrated in the right posterior frontal and anterior parietal lobes measuring up to 37 millimeters diameter (series 9, image 118 and series 10, image 31). The smallest lesion is in the left periatrial white matter measuring 3-4 millimeters. A total of 6 lesions are identified which are T1 hypointense, T2 hyperintense, and demonstrate enhancement which is most conspicuous on series 10. Each of these has not significantly changed in size or configuration since 06/03/2019. All are annotated on series 9. No new lesion is identified. No dural enhancement. No hemosiderin is associated with the enhancing lesions, although there are a few scattered chronic microhemorrhages (series 6, image 80, 65). T2 and FLAIR hyperintensity in keeping with vasogenic edema is most pronounced in the superior right hemisphere and stable to mildly improved since December. No significant intracranial mass effect. There is also mild to moderate edema in the left superior frontal gyrus and the left parietal lobe. No superimposed restricted diffusion suggestive of acute infarction. No ventriculomegaly, extra-axial collection or acute intracranial  hemorrhage. Cervicomedullary junction and pituitary are within normal limits. No new gray or white matter signal abnormality. Vascular: Major intracranial vascular flow voids are stable. The major dural venous sinuses are enhancing and appear to  be patent. Skull and upper cervical spine: Negative visible cervical spine and spinal cord. Visualized bone marrow signal is within normal limits. Sinuses/Orbits: Negative orbits. Trace paranasal sinus mucosal thickening is stable. Other: Mastoids remain clear. Visible internal auditory structures appear normal. IMPRESSION: 1. Six brain lesions ranging from 4 mm to 37 mm diameter have not significantly changed since 06/03/2019 and are most compatible with metastases. Lesion enhancement is most apparent today on series 10, and the lesions are also highly conspicuous on DWI. Associated vasogenic edema is stable to mildly improved. No significant intracranial mass effect. 2. No new metastatic disease or new intracranial abnormality identified. Electronically Signed   By: Genevie Ann M.D.   On: 06/14/2019 12:16   MR Brain W and Wo Contrast  Result Date: 06/03/2019 CLINICAL DATA:  Left arm weakness.  Abnormal CT head EXAM: MRI HEAD WITHOUT AND WITH CONTRAST TECHNIQUE: Multiplanar, multiecho pulse sequences of the brain and surrounding structures were obtained without and with intravenous contrast. CONTRAST:  67mL GADAVIST GADOBUTROL 1 MMOL/ML IV SOLN COMPARISON:  CT head 06/03/2019 FINDINGS: Brain: Multiple enhancing mass lesions are present in the brain. Largest in the right posterior frontal lobe shows homogeneous enhancement measuring 35 x 27 mm. The mass appears to infiltrate and thicken the cortex with extensive adjacent vasogenic edema. This corresponds to the primary CT abnormality Additional smaller enhancing mass lesions are present. 6 mm enhancing lesion right frontal lobe. 11 mm enhancing lesion left frontal lobe with mild edema. 9 mm enhancing lesion left frontal  lobe. 3 mm enhancing lesion left temporoparietal periventricular white matter. 12 mm enhancing lesion left posterior parietal lobe with edema. These lesions show significant restricted diffusion. No associated hemorrhage Ventricle size normal.  No midline shift. Vascular: Normal arterial flow voids. Skull and upper cervical spine: No focal skeletal lesion. Sinuses/Orbits: Mild mucosal edema paranasal sinuses.  Normal orbit Other: None IMPRESSION: Multiple enhancing mass lesions in the brain with surrounding edema compatible with metastatic disease. Patient has history of melanoma. Electronically Signed   By: Franchot Gallo M.D.   On: 06/03/2019 18:13   CT ABDOMEN PELVIS W CONTRAST  Result Date: 06/03/2019 CLINICAL DATA:  Brain MR demonstrating metastatic disease. History of melanoma 4 years ago. EXAM: CT CHEST, ABDOMEN, AND PELVIS WITH CONTRAST TECHNIQUE: Multidetector CT imaging of the chest, abdomen and pelvis was performed following the standard protocol during bolus administration of intravenous contrast. CONTRAST:  164mL OMNIPAQUE IOHEXOL 300 MG/ML  SOLN COMPARISON:  Chest radiograph report 04/29/99 brain MR of earlier today. FINDINGS: CT CHEST FINDINGS Cardiovascular: Mild motion degradation involving the upper and mid chest. Aortic and branch vessel atherosclerosis. Tortuous thoracic aorta. Normal heart size, without pericardial effusion. No central pulmonary embolism, on this non-dedicated study. Mediastinum/Nodes: Extensive mediastinal adenopathy. Right paratracheal node measures 1.6 cm on 18/3. Subcarinal node measures 1.5 cm on 29/3. Right hilar adenopathy at 1.6 cm on 30/3. Prevascular node of 1.0 cm on 22/3. Lungs/Pleura: No pleural fluid. Bronchial wall thickening is most significant in the right middle and right lower lobes, including on 82/5. Centrilobular emphysema. Right middle lobe 5 mm pulmonary nodule on 96/5. Right lower lobe 1.0 cm nodule including on 84/5. Likely calcified 2 mm posterior  right upper lobe pulmonary nodule on 63/5. Musculoskeletal: No acute osseous abnormality. CT ABDOMEN PELVIS FINDINGS Hepatobiliary: Vague hypoattenuating liver lesions bilaterally. Example in the right hepatic lobe at 8 mm on 54/3 and within the lateral segment left liver lobe 1.1 cm on 57/3. Normal gallbladder, without biliary ductal dilatation. Pancreas:  Normal, without mass or ductal dilatation. Spleen: Normal in size, without focal abnormality. Adrenals/Urinary Tract: Normal adrenal glands. Normal left kidney. Too small to characterize interpolar right renal lesion. No hydronephrosis. Bladder wall irregularity with small saccules posteriorly. Stomach/Bowel: Proximal gastric underdistention. Scattered colonic diverticula. Normal terminal ileum. Normal small bowel. Vascular/Lymphatic: Advanced aortic and branch vessel atherosclerosis. Porta hepatis adenopathy, including a nodal mass of 2.6 x 3.1 cm on 66/3. No pelvic sidewall adenopathy. Reproductive: Mild prostatomegaly. Other: No significant free fluid. No evidence of omental or peritoneal disease. Musculoskeletal: Thoracolumbar spondylosis. S-shaped thoracolumbar spine curvature. IMPRESSION: 1. Thoracoabdominal adenopathy and liver lesions, favored to represent metastatic disease. Given emphysema and small pulmonary nodules, differential considerations include metastatic bronchogenic carcinoma and melanoma. 2. Motion degraded evaluation of the upper chest. 3. Aortic atherosclerosis (ICD10-I70.0) and emphysema (ICD10-J43.9). 4. Prostatomegaly with a component of bladder outlet obstruction. Electronically Signed   By: Abigail Miyamoto M.D.   On: 06/03/2019 20:02   NM PET Image Initial (PI) Skull Base To Thigh  Result Date: 06/17/2019 CLINICAL DATA:  Initial treatment strategy for metastatic small cell lung cancer. EXAM: NUCLEAR MEDICINE PET SKULL BASE TO THIGH TECHNIQUE: 7.1 mCi F-18 FDG was injected intravenously. Full-ring PET imaging was performed from the  skull base to thigh after the radiotracer. CT data was obtained and used for attenuation correction and anatomic localization. Fasting blood glucose: 123 mg/dl COMPARISON:  CT scan 06/03/2019 FINDINGS: Mediastinal blood pool activity: SUV max 2.67 Liver activity: SUV max NA NECK: 2.2 cm nodal lesion in the supraclavicular fossa on the right side is hypermetabolic with SUV max of 69.48. Right-sided Retroclavicular lymph node measures 18.5 mm and has an SUV max of 10.56. Incidental CT findings: Advanced carotid artery calcifications. CHEST: Bulky hypermetabolic mediastinal and hilar lymphadenopathy. Hypermetabolic prevascular lymph nodes have an SUV max of 9.72. 16 mm right paratracheal node has an SUV max of 10.39. 2 cm AP window node has an SUV max of 9.32. Subcarinal adenopathy has an SUV max of 8.88. 17 mm right hilar lymph node has an SUV max of 10.31. 9 mm right lower lobe pulmonary nodule is not definitely hypermetabolic and could be a metastatic focus. Difficult to identify the primary lung lesion. It could be in the azygoesophageal recess of the right lower lobe blending in with the subcarinal adenopathy. Incidental CT findings: none ABDOMEN/PELVIS: There are 4 small hypermetabolic metastatic hepatic lesions. The segment 8 lesion measures 13 mm and has an SUV max of 6.09. Enlarged hypermetabolic upper abdominal lymphadenopathy. 17 mm celiac axis lymph node has an SUV max of 7.68. 25 mm periportal adenopathy has an SUV max of 10.68. Incidental CT findings: No retroperitoneal or pelvic lymphadenopathy. No evidence of adrenal gland metastatic disease. SKELETON: No findings suspicious for osseous metastatic disease. Incidental CT findings: none IMPRESSION: 1. Enlarged and hypermetabolic right supraclavicular, mediastinal and hilar lymphadenopathy. The primary lung lesion is not identified for certain as detailed above. Possible primary lesions in the right lower lobe. 2. Metastatic hepatic disease and upper  abdominal metastatic adenopathy. 3. No findings for osseous metastatic disease. Electronically Signed   By: Marijo Sanes M.D.   On: 06/17/2019 10:41      IMPRESSION/PLAN: 1. 69 y.o. 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 Dr. Tammi Klippel discusses the pathology findings and reviews the nature of small cell lung cancer and disease in the brain as a result. We discussed the risks, benefits, short, and long term effects of radiotherapy,  and the patient is interested in proceeding. Dr. Tammi Klippel discusses the delivery and logistics of radiotherapy and recommends a course of 2 weeks of radiotherapy to the whole brain with palliative intent. Written consent is obtained and placed in the chart, a copy was provided to the patient. He will proceed with simulation today and we anticipate his treatment to begin later this week. We also discussed the option of using Namenda to prevent cognitive compromise from whole brain therapy and I've sent in a prescription for this after we detailed it's use and profile. 2. Steroid dosing. The patient's neurologic symptoms are stable, but we discussed going to a lower dose that could decrease side effects of insomnia. We sent in a new prescription for Dexamethasone 4 mg to be taken one po BID. We will help him taper this at the completion of his radiotherapy. He understands to avoid taking the 6 mg strength after today. He is also counseled on continuing his prilosec as well to reduce GERD symptoms and risks of ulcers.  3. BPH. The patient is already taking .8 mg of Flomax. He was encouraged to follow up with Dr. Marin Olp or his PCP for further management of this.  4. 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 He will follow up with Dr. Marin Olp for plans to proceed with continued surveillance.    This encounter was provided by telemedicine platform Webex.  The patient has given verbal consent for this type of encounter  and has been advised to only accept a meeting of this type in a secure network environment. The time spent during this encounter was 35 minutes. The attendants for this meeting include Dr. Tammi Klippel,  Tyler Pita  and [name] and his wife.  During the encounter,  Dr. Tammi Klippel was located the [facility], and Tyler Pita was working remotely at home. [name] and his wife were located in the [facility].     Carola Rhine, [facility]   Page Me     Seen with Ledon Snare, MD  ------------------------------  Please see the note from Shona Simpson, PA-C from today's visit for more details of today's encounter.  I have personally performed a face to face diagnostic evaluation on this patient and devised the following assessment and plan.  The patient is seen today regarding the patient's diagnosis of brain metastases from metastatic extensive stage small cell lung cancer.  Based on the patient's information, the patient is appropriate to proceed with whole brain radiation treatments.  The patient underwent two bronchoscopy procedures.  He is currently receiving dexamethasone for brain metastases.  All of the patient's questions were answered and the patient does wish to proceed with treatment.  We will therefore proceed with simulation today for treatment planning.  I anticipate treating the patient with a 2-week course of treatment to the whole brain.  Sometimes, we might consider adding a brief course of consolidative thoracic RT for bulky chest involvement.  However, the mediastinal nodes are not causing any airway narrowing at this time.    Staging -  Stage IV.  KPS=90  _____________________________________  Sheral Apley Tammi Klippel, M.D.

## 2019-06-18 ENCOUNTER — Encounter: Payer: Self-pay | Admitting: *Deleted

## 2019-06-18 DIAGNOSIS — Z510 Encounter for antineoplastic radiation therapy: Secondary | ICD-10-CM | POA: Diagnosis not present

## 2019-06-18 DIAGNOSIS — C3490 Malignant neoplasm of unspecified part of unspecified bronchus or lung: Secondary | ICD-10-CM | POA: Diagnosis not present

## 2019-06-18 DIAGNOSIS — C7931 Secondary malignant neoplasm of brain: Secondary | ICD-10-CM | POA: Diagnosis not present

## 2019-06-18 NOTE — Progress Notes (Signed)
  Radiation Oncology         ([phone]) [phone] ________________________________  Name: Walter Hernandez MRN: [id number]  Date: 06/17/2019  DOB: 09-Aug-1950  SIMULATION AND TREATMENT PLANNING NOTE    ICD-10-CM   1. Brain metastases ([place])  C79.31     DIAGNOSIS:  69 yo man with brain metastases from extensive stage small cell lung cancer  NARRATIVE:  The patient was brought to the [facility].  Identity was confirmed.  All relevant records and images related to the planned course of therapy were reviewed.  The patient freely provided informed written consent to proceed with treatment after reviewing the details related to the planned course of therapy. The consent form was witnessed and verified by the simulation staff.  Then, the patient was set-up in a stable reproducible  supine position for radiation therapy.  CT images were obtained.  Surface markings were placed.  The CT images were loaded into the planning software.  Then the target and avoidance structures were contoured.  Treatment planning then occurred.  The radiation prescription was entered and confirmed.  Then, I designed and supervised the construction of a total of 3 medically necessary complex treatment devices, including a custom made thermoplastic mask used for immobilization and two complex multileaf collimators to cover the entire intracranial contents, while shielding the eyes and face.  Each [facility] is independently created to account for beam divergence.  The right and left lateral fields will be treated with 6 MV X-rays.  I have requested : Isodose Plan.    PLAN:  The whole brain will be treated to 30 Gy in 10 fractions.  ________________________________  Sheral Apley Tammi Klippel, M.D.

## 2019-06-19 ENCOUNTER — Other Ambulatory Visit: Payer: Self-pay | Admitting: Hematology & Oncology

## 2019-06-19 ENCOUNTER — Other Ambulatory Visit: Payer: Self-pay

## 2019-06-19 ENCOUNTER — Ambulatory Visit
Admission: RE | Admit: 2019-06-19 | Discharge: 2019-06-19 | Disposition: A | Discharge status: Home / Self Care | Payer: Medicare Other | Source: Ambulatory Visit | Attending: Radiation Oncology | Admitting: Radiation Oncology

## 2019-06-19 ENCOUNTER — Ambulatory Visit (HOSPITAL_COMMUNITY): Admission: RE | Admit: 2019-06-19 | Payer: Medicare Other | Source: Ambulatory Visit

## 2019-06-19 ENCOUNTER — Encounter: Payer: Self-pay | Admitting: Hematology & Oncology

## 2019-06-19 ENCOUNTER — Ambulatory Visit: Payer: Medicare Other | Admitting: Radiation Oncology

## 2019-06-19 DIAGNOSIS — Z7189 Other specified counseling: Secondary | ICD-10-CM

## 2019-06-19 DIAGNOSIS — C7931 Secondary malignant neoplasm of brain: Secondary | ICD-10-CM | POA: Diagnosis not present

## 2019-06-19 DIAGNOSIS — C3490 Malignant neoplasm of unspecified part of unspecified bronchus or lung: Secondary | ICD-10-CM | POA: Diagnosis not present

## 2019-06-19 DIAGNOSIS — Z510 Encounter for antineoplastic radiation therapy: Secondary | ICD-10-CM | POA: Diagnosis not present

## 2019-06-19 HISTORY — DX: Other specified counseling: Z71.89

## 2019-06-19 NOTE — Progress Notes (Signed)
START ON PATHWAY REGIMEN - Small Cell Lung     Cycles 1 through 4, every 21 days:     Atezolizumab      Carboplatin      Etoposide    Cycles 5 and beyond, every 21 days:     Atezolizumab   **Always confirm dose/schedule in your pharmacy ordering system**  Patient Characteristics: Newly Diagnosed, Preoperative or Nonsurgical Candidate (Clinical Staging), First Line, Extensive Stage Therapeutic Status: Newly Diagnosed, Preoperative or Nonsurgical Candidate (Clinical Staging) AJCC T Category: cT4 AJCC N Category: cN3 AJCC M Category: pM1c AJCC 8 Stage Grouping: IVB Stage Classification: Extensive Intent of Therapy: Non-Curative / Palliative Intent, Discussed with Patient

## 2019-06-20 ENCOUNTER — Inpatient Hospital Stay (HOSPITAL_BASED_OUTPATIENT_CLINIC_OR_DEPARTMENT_OTHER): Payer: Medicare Other | Admitting: Hematology & Oncology

## 2019-06-20 ENCOUNTER — Ambulatory Visit: Payer: Medicare Other | Admitting: Radiation Oncology

## 2019-06-20 ENCOUNTER — Inpatient Hospital Stay: Payer: Medicare Other | Attending: Hematology & Oncology

## 2019-06-20 ENCOUNTER — Ambulatory Visit
Admission: RE | Admit: 2019-06-20 | Discharge: 2019-06-20 | Disposition: A | Discharge status: Home / Self Care | Payer: Medicare Other | Source: Ambulatory Visit | Attending: Radiation Oncology | Admitting: Radiation Oncology

## 2019-06-20 ENCOUNTER — Encounter: Payer: Self-pay | Admitting: Hematology & Oncology

## 2019-06-20 ENCOUNTER — Other Ambulatory Visit: Payer: Self-pay

## 2019-06-20 ENCOUNTER — Encounter: Payer: Self-pay | Admitting: *Deleted

## 2019-06-20 VITALS — BP 123/82 | HR 101 | Temp 97.1°F | Resp 20 | Wt 144.8 lb

## 2019-06-20 DIAGNOSIS — F17210 Nicotine dependence, cigarettes, uncomplicated: Secondary | ICD-10-CM | POA: Diagnosis not present

## 2019-06-20 DIAGNOSIS — C7800 Secondary malignant neoplasm of unspecified lung: Secondary | ICD-10-CM | POA: Diagnosis not present

## 2019-06-20 DIAGNOSIS — C3490 Malignant neoplasm of unspecified part of unspecified bronchus or lung: Secondary | ICD-10-CM

## 2019-06-20 DIAGNOSIS — R351 Nocturia: Secondary | ICD-10-CM | POA: Insufficient documentation

## 2019-06-20 DIAGNOSIS — C7931 Secondary malignant neoplasm of brain: Secondary | ICD-10-CM | POA: Insufficient documentation

## 2019-06-20 DIAGNOSIS — R531 Weakness: Secondary | ICD-10-CM | POA: Diagnosis not present

## 2019-06-20 DIAGNOSIS — C4372 Malignant melanoma of left lower limb, including hip: Secondary | ICD-10-CM | POA: Diagnosis not present

## 2019-06-20 DIAGNOSIS — C787 Secondary malignant neoplasm of liver and intrahepatic bile duct: Secondary | ICD-10-CM | POA: Insufficient documentation

## 2019-06-20 DIAGNOSIS — Z79899 Other long term (current) drug therapy: Secondary | ICD-10-CM | POA: Diagnosis not present

## 2019-06-20 DIAGNOSIS — Z510 Encounter for antineoplastic radiation therapy: Secondary | ICD-10-CM | POA: Diagnosis not present

## 2019-06-20 DIAGNOSIS — N401 Enlarged prostate with lower urinary tract symptoms: Secondary | ICD-10-CM | POA: Insufficient documentation

## 2019-06-20 LAB — CBC WITH DIFFERENTIAL (CANCER CENTER ONLY)
Abs Immature Granulocytes: 1.85 10*3/uL — ABNORMAL HIGH (ref 0.00–0.07)
Basophils Absolute: 0.2 10*3/uL — ABNORMAL HIGH (ref 0.0–0.1)
Basophils Relative: 1 %
Eosinophils Absolute: 0 10*3/uL (ref 0.0–0.5)
Eosinophils Relative: 0 %
HCT: 43 % (ref 39.0–52.0)
Hemoglobin: 15.2 g/dL (ref 13.0–17.0)
Immature Granulocytes: 8 %
Lymphocytes Relative: 5 %
Lymphs Abs: 1.1 10*3/uL (ref 0.7–4.0)
MCH: 33.7 pg (ref 26.0–34.0)
MCHC: 35.3 g/dL (ref 30.0–36.0)
MCV: 95.3 fL (ref 80.0–100.0)
Monocytes Absolute: 1.3 10*3/uL — ABNORMAL HIGH (ref 0.1–1.0)
Monocytes Relative: 5 %
Neutro Abs: 19.8 10*3/uL — ABNORMAL HIGH (ref 1.7–7.7)
Neutrophils Relative %: 81 %
Platelet Count: 255 10*3/uL (ref 150–400)
RBC: 4.51 MIL/uL (ref 4.22–5.81)
RDW: 11.5 % (ref 11.5–15.5)
WBC Count: 24.2 10*3/uL — ABNORMAL HIGH (ref 4.0–10.5)
nRBC: 0 % (ref 0.0–0.2)

## 2019-06-20 LAB — CMP (CANCER CENTER ONLY)
ALT: 47 U/L — ABNORMAL HIGH (ref 0–44)
AST: 20 U/L (ref 15–41)
Albumin: 4 g/dL (ref 3.5–5.0)
Alkaline Phosphatase: 61 U/L (ref 38–126)
Anion gap: 11 (ref 5–15)
BUN: 19 mg/dL (ref 8–23)
CO2: 27 mmol/L (ref 22–32)
Calcium: 9 mg/dL (ref 8.9–10.3)
Chloride: 93 mmol/L — ABNORMAL LOW (ref 98–111)
Creatinine: 0.73 mg/dL (ref 0.61–1.24)
GFR, Est AFR Am: >60 mL/min (ref >60)
GFR, Estimated: >60 mL/min (ref >60)
Glucose, Bld: 154 mg/dL — ABNORMAL HIGH (ref 70–99)
Potassium: 4.3 mmol/L (ref 3.5–5.1)
Sodium: 131 mmol/L — ABNORMAL LOW (ref 135–145)
Total Bilirubin: 0.6 mg/dL (ref 0.3–1.2)
Total Protein: 6 g/dL — ABNORMAL LOW (ref 6.5–8.1)

## 2019-06-20 MED ORDER — FINASTERIDE 5 MG PO TABS: 5.0000 mg | ORAL_TABLET | Freq: Every day | ORAL | 3 refills | Status: DC

## 2019-06-20 NOTE — Progress Notes (Signed)
Initial RN Navigator Patient Visit  Name: Walter Hernandez Date of Referral :  Diagnosis: SCLCa  Met with patient prior to their visit with MD. Hanley Seamen patient "Your Patient Navigator" handout which explains my role, areas in which I am able to help, and all the contact information for myself and the office. Also gave patient MD and Navigator business card. Reviewed with patient the general overview of expected course after initial diagnosis and time frame for all steps to be completed.  New patient packet given to patient which includes: orientation to office and staff; campus directory; education on My Chart and Advance Directives; and patient centered education on lung cancer.   Patient completed visit with Dr. Marin Olp  Revisited with patient after MD visit. Patient will need  Port placement Chemo Education Nutrition Consult Treatment initiated  Patient is being seen at 4p and no orders have yet been placed by the physician. Explained to the patient and his wife that I will call them tomorrow and get all the necessary appointments scheduled. They understood. .  Patient understands all follow up procedures and expectations. They have my number to reach out for any further clarification or additional needs.

## 2019-06-20 NOTE — Progress Notes (Signed)
Hematology and Oncology Follow Up Visit  Walter Hernandez [id number] Jul 06, 1950 69 y.o. 06/20/2019   Principle Diagnosis:  Stage IB (T2aN0M0) superficial spreading melanoma of the left lower leg  Small Cell Lung Cancer -- Extensive Stage -- lung/liver/brain mets  Current Therapy:   Carboplatin/VP-16/Tecentriq -- cycle #1 - start on 07/07/2018 Cranial XRT -- 06/19/2019 thru 07/02/2019    Interim History:  Walter Hernandez is here today for a discussion about his new cancer.  Unfortunately, he now has small cell lung cancer.  He was a big smoker.  Hopefully, he will stop smoking within the next couple weeks.  He presented to [facility] right after Christmas.  He had weakness in his left arm.  He was found to have multiple brain metastasis.  He had mediastinal and hilar adenopathy.  We ultimately were able to get a biopsy with bronchoscopy.  This was done on 06/11/2019.  The pathology report ([id number]) showed small cell lung cancer.  He has started radiation therapy for his brain metastasis.  He had a PET scan done on June 17, 2019.  This shows adenopathy in the mediastinum, hilum and right supraclavicular nodes.  He has 4 liver metastasis.  He has upper abdominal adenopathy.  He has pulmonary nodules.  He does have a good performance status.  He still has some weakness in his left arm.  He is smoking 1 cigarette a day.  He is going to try to stop this by time radiation is finished.  He started radiation therapy 2 days ago.  He is eating quite well.  He is on steroids for the CNS metastasis.  He is having some urinary issues.  He has nocturia.  I am sure that he has an enlarged prostate.  He is on some Flomax.  He might need to be placed on another agent.-Proscar-5 mg p.o. daily.  He will need to have a Port-A-Cath placed.  He has had no problems with leg swelling.  Has had no rashes.  He has had no visual difficulties.  Overall, I would say his performance status is ECOG 1.     Medications:  Allergies as of 06/20/2019   No Known Allergies     Medication List       Accurate as of June 20, 2019  5:07 PM. If you have any questions, ask your nurse or doctor.        STOP taking these medications   Emergen-C Immune Plus Pack Stopped by: Volanda Napoleon, MD   fluconazole 100 MG tablet Commonly known as: DIFLUCAN Stopped by: Volanda Napoleon, MD     TAKE these medications   acetaminophen 500 MG tablet Commonly known as: TYLENOL Take 1,000 mg by mouth every 6 (six) hours as needed for moderate pain.   ALPRAZolam 0.5 MG tablet Commonly known as: XANAX Take 1 tablet (0.5 mg total) by mouth every 6 (six) hours as needed for anxiety.   amLODipine 5 MG tablet Commonly known as: NORVASC Take 5 mg by mouth daily.   dexamethasone 4 MG tablet Commonly known as: DECADRON Take 1 tablet (4 mg total) by mouth 2 (two) times daily.   IBUPROFEN & ACETAMINOPHEN PO Take 1-2 tablets by mouth every 6 (six) hours as needed (pain).   LORazepam 0.5 MG tablet Commonly known as: ATIVAN 1 tablet po 30 minutes prior to radiation or MRI   memantine 5 MG tablet Commonly known as: Namenda Starting 06/18/19 Take one tab po daily. Starting 06/25/19 Take one tab po q  am and one tab po q pm. Starting 1/26/21Take two tabs po q am, and one tab po q pm. Starting 07/09/19 Take two tabs po q am and two tabs po q pm until 12/03/19.   multivitamin capsule Take 1 capsule by mouth daily.   nicotine 21 mg/24hr patch Commonly known as: NICODERM CQ - dosed in mg/24 hours Place 1 patch (21 mg total) onto the skin daily.   omeprazole 40 MG capsule Commonly known as: PRILOSEC Take 40 mg by mouth daily.   ondansetron 4 MG tablet Commonly known as: ZOFRAN Take 1 tablet (4 mg total) by mouth every 6 (six) hours as needed for nausea.   OPCON-A OP Place 2 drops into both eyes as needed (for dry eyes).   tamsulosin 0.4 MG Caps capsule Commonly known as: FLOMAX Take 2 capsules (0.8 mg  total) by mouth daily. What changed:   how much to take  when to take this       Allergies: No Known Allergies  Past Medical History, Surgical history, Social history, and Family History were reviewed and updated.  Review of Systems: Review of Systems  Constitutional: Negative.   HENT: Negative.   Eyes: Negative.   Respiratory: Negative.   Cardiovascular: Negative.   Gastrointestinal: Negative.   Genitourinary: Negative.   Musculoskeletal: Negative.   Skin: Negative.   Neurological: Negative.   Endo/Heme/Allergies: Negative.   Psychiatric/Behavioral: Negative.      Physical Exam:  weight is 144 lb 12.8 oz (65.7 kg). His temporal temperature is 97.1 F (36.2 C) (abnormal). His blood pressure is 123/82 and his pulse is 101 (abnormal). His respiration is 20 and oxygen saturation is 94%.   Wt Readings from Last 3 Encounters:  06/20/19 144 lb 12.8 oz (65.7 kg)  06/17/19 143 lb 3.2 oz (65 kg)  06/11/19 142 lb (64.4 kg)   Physical Exam Vitals reviewed.  HENT:     Head: Normocephalic and atraumatic.  Eyes:     Pupils: Pupils are equal, round, and reactive to light.  Cardiovascular:     Rate and Rhythm: Normal rate and regular rhythm.     Heart sounds: Normal heart sounds.  Pulmonary:     Effort: Pulmonary effort is normal.     Breath sounds: Normal breath sounds.  Abdominal:     General: Bowel sounds are normal.     Palpations: Abdomen is soft.  Musculoskeletal:        General: No tenderness or deformity. Normal range of motion.     Cervical back: Normal range of motion.     Comments: There is weakness of his left arm.  He has some limited range of motion of the left arm.  Right arm is with good range of motion and strength.  There is no swelling in his legs. .  Lymphadenopathy:     Cervical: No cervical adenopathy.  Skin:    General: Skin is warm and dry.     Findings: No erythema or rash.     Comments: He does have the healed wide local excision in the left  internal lower leg. This is well-healed.  Neurological:     Mental Status: He is alert and oriented to person, place, and time.  Psychiatric:        Behavior: Behavior normal.        Thought Content: Thought content normal.        Judgment: Judgment normal.       Lab Results  Component Value Date  WBC 24.2 (H) 06/20/2019   HGB 15.2 06/20/2019   HCT 43.0 06/20/2019   MCV 95.3 06/20/2019   PLT 255 06/20/2019   No results found for: FERRITIN, IRON, TIBC, UIBC, IRONPCTSAT Lab Results  Component Value Date   RBC 4.51 06/20/2019   No results found for: KPAFRELGTCHN, LAMBDASER, KAPLAMBRATIO No results found for: IGGSERUM, IGA, IGMSERUM No results found for: Kathrynn Ducking, MSPIKE, SPEI   Chemistry      Component Value Date/Time   NA 131 (L) 06/20/2019 1519   NA 142 01/30/2017 0804   K 4.3 06/20/2019 1519   K 4.1 01/30/2017 0804   CL 93 (L) 06/20/2019 1519   CL 105 01/25/2016 0926   CO2 27 06/20/2019 1519   CO2 29 01/30/2017 0804   BUN 19 06/20/2019 1519   BUN 6.9 (L) 01/30/2017 0804   CREATININE 0.73 06/20/2019 1519   CREATININE 0.8 01/30/2017 0804      Component Value Date/Time   CALCIUM 9.0 06/20/2019 1519   CALCIUM 10.5 (H) 01/30/2017 0804   ALKPHOS 61 06/20/2019 1519   ALKPHOS 79 01/30/2017 0804   AST 20 06/20/2019 1519   AST 34 01/30/2017 0804   ALT 47 (H) 06/20/2019 1519   ALT 18 01/30/2017 0804   BILITOT 0.6 06/20/2019 1519   BILITOT 1.49 (H) 01/30/2017 0804     Impression and Plan: Mr. Cajamarca is a 69 year old male with a history of stage Ib melanoma of the left lower leg.  This really is not a problem for him.  The problem now is that he has extensive stage small cell lung cancer.  I think that he would be a good candidate for chemo immunotherapy.  He has a decent performance status.  I would place him on carboplatinum/etoposide/Tecentriq.  I think this would be reasonable.  I gave him information sheets about  the chemotherapy medicines and the Tecentriq.  I went over the side effects.  He will not finished radiation until the January 26.  With that, we will have to hold off on treatment till after he gets done with radiation.  Again he will need to have a Port-A-Cath placed.  I suspect that we probably will not get started with treatment until 1 February.  I think that we have the flexibility that we can wait until early February to start his therapy.  He really does not have bulky disease.  I spent about 45 minutes or so with he and his wife.  I went over his scan results.  I went over the pathology report.  I went over his lab work.  We talked about the chemotherapy and the side effects.  I suspect we probably will also need Neulasta to help with his neutropenia and preventing infections.  I would like to see him back when we start the second cycle of treatment.  We probably will do 2 cycles of treatment and then repeat his PET scan.  I would plan for total of 6 cycles of treatment and then do immunotherapy as a maintenance.   Volanda Napoleon, MD 1/14/20215:07 PM

## 2019-06-21 ENCOUNTER — Ambulatory Visit (HOSPITAL_COMMUNITY): Payer: Medicare Other

## 2019-06-21 ENCOUNTER — Ambulatory Visit
Admission: RE | Admit: 2019-06-21 | Discharge: 2019-06-21 | Disposition: A | Discharge status: Home / Self Care | Payer: Medicare Other | Source: Ambulatory Visit | Attending: Radiation Oncology | Admitting: Radiation Oncology

## 2019-06-21 ENCOUNTER — Encounter: Payer: Self-pay | Admitting: *Deleted

## 2019-06-21 ENCOUNTER — Other Ambulatory Visit: Payer: Self-pay

## 2019-06-21 ENCOUNTER — Inpatient Hospital Stay: Admit: 2019-06-21 | Payer: Medicare Other | Admitting: Neurosurgery

## 2019-06-21 ENCOUNTER — Other Ambulatory Visit: Payer: Self-pay | Admitting: Physician Assistant

## 2019-06-21 DIAGNOSIS — C3490 Malignant neoplasm of unspecified part of unspecified bronchus or lung: Secondary | ICD-10-CM | POA: Diagnosis not present

## 2019-06-21 DIAGNOSIS — C7931 Secondary malignant neoplasm of brain: Secondary | ICD-10-CM | POA: Diagnosis not present

## 2019-06-21 DIAGNOSIS — Z510 Encounter for antineoplastic radiation therapy: Secondary | ICD-10-CM | POA: Diagnosis not present

## 2019-06-21 LAB — LACTATE DEHYDROGENASE: LDH: 355 U/L — ABNORMAL HIGH (ref 98–192)

## 2019-06-21 SURGERY — CRANIOTOMY TUMOR EXCISION
Anesthesia: General | Laterality: Right

## 2019-06-21 NOTE — Progress Notes (Signed)
Scheduled all patients upcoming needs  Port Placement - scheduled for 07/03/19 at [place] with wife, Kennyth Lose, that patient needs to be NPO after Midnight. She will drive him home.    Chemo Education - scheduled for 07/02/19 Nutrition Consult - scheduled for 07/02/19  First Treatment - scheduled for 07/08/19 at 1030.   Kennyth Lose is aware of all appointments, including location, time and date. She knows to contact me if she has any questions or concerns.

## 2019-06-24 ENCOUNTER — Other Ambulatory Visit: Payer: Self-pay

## 2019-06-24 ENCOUNTER — Ambulatory Visit
Admission: RE | Admit: 2019-06-24 | Discharge: 2019-06-24 | Disposition: A | Discharge status: Home / Self Care | Payer: Medicare Other | Source: Ambulatory Visit | Attending: Radiation Oncology | Admitting: Radiation Oncology

## 2019-06-24 ENCOUNTER — Ambulatory Visit: Payer: Medicare Other | Admitting: Hematology & Oncology

## 2019-06-24 ENCOUNTER — Other Ambulatory Visit: Payer: Medicare Other

## 2019-06-24 ENCOUNTER — Other Ambulatory Visit (HOSPITAL_COMMUNITY): Payer: Medicare Other

## 2019-06-24 DIAGNOSIS — C7931 Secondary malignant neoplasm of brain: Secondary | ICD-10-CM | POA: Diagnosis not present

## 2019-06-24 DIAGNOSIS — C3490 Malignant neoplasm of unspecified part of unspecified bronchus or lung: Secondary | ICD-10-CM | POA: Diagnosis not present

## 2019-06-24 DIAGNOSIS — Z510 Encounter for antineoplastic radiation therapy: Secondary | ICD-10-CM | POA: Diagnosis not present

## 2019-06-25 ENCOUNTER — Encounter: Payer: Self-pay | Admitting: *Deleted

## 2019-06-25 ENCOUNTER — Other Ambulatory Visit: Payer: Self-pay

## 2019-06-25 ENCOUNTER — Ambulatory Visit
Admission: RE | Admit: 2019-06-25 | Discharge: 2019-06-25 | Disposition: A | Discharge status: Home / Self Care | Payer: Medicare Other | Source: Ambulatory Visit | Attending: Radiation Oncology | Admitting: Radiation Oncology

## 2019-06-25 DIAGNOSIS — Z510 Encounter for antineoplastic radiation therapy: Secondary | ICD-10-CM | POA: Diagnosis not present

## 2019-06-25 DIAGNOSIS — C3490 Malignant neoplasm of unspecified part of unspecified bronchus or lung: Secondary | ICD-10-CM | POA: Diagnosis not present

## 2019-06-25 DIAGNOSIS — C7931 Secondary malignant neoplasm of brain: Secondary | ICD-10-CM | POA: Diagnosis not present

## 2019-06-26 ENCOUNTER — Other Ambulatory Visit: Payer: Self-pay | Admitting: Radiation Oncology

## 2019-06-26 ENCOUNTER — Ambulatory Visit
Admission: RE | Admit: 2019-06-26 | Discharge: 2019-06-26 | Disposition: A | Discharge status: Home / Self Care | Payer: Medicare Other | Source: Ambulatory Visit | Attending: Radiation Oncology | Admitting: Radiation Oncology

## 2019-06-26 ENCOUNTER — Other Ambulatory Visit: Payer: Self-pay

## 2019-06-26 DIAGNOSIS — Z510 Encounter for antineoplastic radiation therapy: Secondary | ICD-10-CM | POA: Diagnosis not present

## 2019-06-26 DIAGNOSIS — C7931 Secondary malignant neoplasm of brain: Secondary | ICD-10-CM | POA: Diagnosis not present

## 2019-06-26 DIAGNOSIS — C3490 Malignant neoplasm of unspecified part of unspecified bronchus or lung: Secondary | ICD-10-CM | POA: Diagnosis not present

## 2019-06-26 MED ORDER — SUCRALFATE 1 G PO TABS: 1.0000 g | ORAL_TABLET | Freq: Four times a day (QID) | ORAL | 0 refills | Status: DC

## 2019-06-27 ENCOUNTER — Ambulatory Visit
Admission: RE | Admit: 2019-06-27 | Discharge: 2019-06-27 | Disposition: A | Discharge status: Home / Self Care | Payer: Medicare Other | Source: Ambulatory Visit | Attending: Radiation Oncology | Admitting: Radiation Oncology

## 2019-06-27 ENCOUNTER — Other Ambulatory Visit: Payer: Self-pay

## 2019-06-27 ENCOUNTER — Encounter: Payer: Self-pay | Admitting: *Deleted

## 2019-06-27 DIAGNOSIS — C7931 Secondary malignant neoplasm of brain: Secondary | ICD-10-CM | POA: Diagnosis not present

## 2019-06-27 DIAGNOSIS — C3490 Malignant neoplasm of unspecified part of unspecified bronchus or lung: Secondary | ICD-10-CM | POA: Diagnosis not present

## 2019-06-27 DIAGNOSIS — Z510 Encounter for antineoplastic radiation therapy: Secondary | ICD-10-CM | POA: Diagnosis not present

## 2019-06-27 NOTE — Progress Notes (Signed)
Patient's wife notifying the office that patient has a sore throat. The on call service was called and Carafate was prescribed. Patient took one dose and stated it was ineffective. No other symptoms.  Encouraged the patient to continue taking Carafate. Also instructed Colletta [place] to call the office if he developed other symptoms. She understood.

## 2019-06-28 ENCOUNTER — Other Ambulatory Visit: Payer: Self-pay

## 2019-06-28 ENCOUNTER — Ambulatory Visit
Admission: RE | Admit: 2019-06-28 | Discharge: 2019-06-28 | Disposition: A | Discharge status: Home / Self Care | Payer: Medicare Other | Source: Ambulatory Visit | Attending: Radiation Oncology | Admitting: Radiation Oncology

## 2019-06-28 ENCOUNTER — Other Ambulatory Visit: Payer: Self-pay | Admitting: Radiation Oncology

## 2019-06-28 ENCOUNTER — Other Ambulatory Visit: Payer: Self-pay | Admitting: Radiation Therapy

## 2019-06-28 DIAGNOSIS — Z510 Encounter for antineoplastic radiation therapy: Secondary | ICD-10-CM | POA: Diagnosis not present

## 2019-06-28 DIAGNOSIS — C7931 Secondary malignant neoplasm of brain: Secondary | ICD-10-CM | POA: Diagnosis not present

## 2019-06-28 DIAGNOSIS — C3490 Malignant neoplasm of unspecified part of unspecified bronchus or lung: Secondary | ICD-10-CM | POA: Diagnosis not present

## 2019-06-28 MED ORDER — NYSTATIN 100000 UNIT/ML MT SUSP: 5.0000 mL | Freq: Four times a day (QID) | OROMUCOSAL | 0 refills | Status: DC

## 2019-07-01 ENCOUNTER — Other Ambulatory Visit: Payer: Self-pay

## 2019-07-01 ENCOUNTER — Other Ambulatory Visit: Payer: Self-pay | Admitting: Hematology & Oncology

## 2019-07-01 ENCOUNTER — Ambulatory Visit
Admission: RE | Admit: 2019-07-01 | Discharge: 2019-07-01 | Disposition: A | Discharge status: Home / Self Care | Payer: Medicare Other | Source: Ambulatory Visit | Attending: Radiation Oncology | Admitting: Radiation Oncology

## 2019-07-01 DIAGNOSIS — C3491 Malignant neoplasm of unspecified part of right bronchus or lung: Secondary | ICD-10-CM

## 2019-07-01 DIAGNOSIS — C7931 Secondary malignant neoplasm of brain: Secondary | ICD-10-CM | POA: Diagnosis not present

## 2019-07-01 DIAGNOSIS — C3490 Malignant neoplasm of unspecified part of unspecified bronchus or lung: Secondary | ICD-10-CM | POA: Diagnosis not present

## 2019-07-01 DIAGNOSIS — Z510 Encounter for antineoplastic radiation therapy: Secondary | ICD-10-CM | POA: Diagnosis not present

## 2019-07-02 ENCOUNTER — Other Ambulatory Visit: Payer: Self-pay

## 2019-07-02 ENCOUNTER — Ambulatory Visit
Admission: RE | Admit: 2019-07-02 | Discharge: 2019-07-02 | Disposition: A | Discharge status: Home / Self Care | Payer: Medicare Other | Source: Ambulatory Visit | Attending: Radiation Oncology | Admitting: Radiation Oncology

## 2019-07-02 ENCOUNTER — Inpatient Hospital Stay: Payer: Medicare Other

## 2019-07-02 ENCOUNTER — Encounter: Payer: Self-pay | Admitting: Radiation Oncology

## 2019-07-02 ENCOUNTER — Other Ambulatory Visit: Payer: Self-pay | Admitting: Radiology

## 2019-07-02 ENCOUNTER — Inpatient Hospital Stay: Payer: Medicare Other | Admitting: Nutrition

## 2019-07-02 ENCOUNTER — Other Ambulatory Visit: Payer: Self-pay | Admitting: *Deleted

## 2019-07-02 ENCOUNTER — Encounter (HOSPITAL_COMMUNITY): Payer: Self-pay | Admitting: Hematology & Oncology

## 2019-07-02 ENCOUNTER — Encounter: Payer: Self-pay | Admitting: *Deleted

## 2019-07-02 DIAGNOSIS — C3490 Malignant neoplasm of unspecified part of unspecified bronchus or lung: Secondary | ICD-10-CM | POA: Diagnosis not present

## 2019-07-02 DIAGNOSIS — C7931 Secondary malignant neoplasm of brain: Secondary | ICD-10-CM | POA: Diagnosis not present

## 2019-07-02 DIAGNOSIS — Z510 Encounter for antineoplastic radiation therapy: Secondary | ICD-10-CM | POA: Diagnosis not present

## 2019-07-02 MED ORDER — DOXYCYCLINE HYCLATE 100 MG PO TABS: 100.0000 mg | ORAL_TABLET | Freq: Two times a day (BID) | ORAL | 0 refills | Status: DC

## 2019-07-02 MED FILL — DOXYCYCLINE HYCLATE 100 MG: 100 | 7 days supply | Qty: 14 | Fill #0

## 2019-07-02 NOTE — Progress Notes (Signed)
69 year old male diagnosed with SCLC with mets to brain, lung and liver. Hx of Melanoma of left leg.  Current Therapy:        Carboplatin/VP-16/Tecentriq -- cycle #1 - start on 07/07/2018 Cranial XRT -- 06/19/2019 thru 07/02/2019  PMH includes HTN, Hiatal Hernia, GERD.  Medications include Xanax, decadron, Ativan, MVI, Prilosec, Zofran.  Labs include Na 131 and Glucose 154.  Height: 5'7" Weight: 144.8 pounds UBW: 143 pounds August 2020. BMI: 22.68.  Patient is here for chemo and nutrition education prior to start of treatment. Reports eating well and denies nutrition impact symptoms. Swallowing improved after medication given for thrush.  Nutrition Diagnosis: Food and Nutrition Related Knowledge Deficit related to cancer and associated treatments as evidenced by no prior need for nutrition related information  Intervention: Educated to consume adequate calories and protein in small, frequent meals and snacks. Reviewed high protein snacks. Provided samples of oral nutrition supplements. Gave patient coupons. Encouraged weight maintenance. Questions answered and teach back method used.  Monitoring, Evaluation, Goals: Patient will tolerate adequate calories and protein to minimize loss of lean body mass.  Next Visit: To be scheduled as needed.

## 2019-07-02 NOTE — Progress Notes (Unsigned)
Patient in chemotherapy education class with  Wife. Discussed side effects of Tecentriq, Carboplatin, and etoposide          which include but are not limited to myelosuppression, decreased appetite, fatigue, fever, allergic or infusional reaction, mucositis, cardiac toxicity, cough, SOB, altered taste, nausea and vomiting, diarrhea, constipation, elevated LFTs myalgia and arthralgias, hair loss or thinning, rash, skin dryness, nail changes, peripheral neuropathy, discolored urine, delayed wound healing, mental changes (Chemo brain), increased risk of infections, weight loss.  Reviewed infusion room and office policy and procedure and phone numbers 24 hours x 7 days a week.   Reviewed when to call the office with any concerns or problems.  Scientist, clinical (histocompatibility and immunogenetics) given.  Discussed portacath insertion and EMLA cream administration.  Antiemetic protocol and chemotherapy schedule reviewed. Patient verbalized understanding of chemotherapy indications and possible side effects.  Teachback done

## 2019-07-02 NOTE — Progress Notes (Signed)
Met with patient and his wife briefly before his nutrition consult. Walter Hernandez is doing well, but tired. Del[place]rence states he is doing well except for a spot on his left elbow. The site is swollen, red and he states it's painful.   Requested that Dr Marin Olp assess the site. After assessment, he requests that antibiotics be sent to the pharmacy downstairs. Prescription sent.   Walter Hernandez, and the patient know to reach out as needed. I will follow up next week during his first treatment.

## 2019-07-03 ENCOUNTER — Encounter (HOSPITAL_COMMUNITY): Payer: Self-pay

## 2019-07-03 ENCOUNTER — Ambulatory Visit (HOSPITAL_COMMUNITY)
Admission: RE | Admit: 2019-07-03 | Discharge: 2019-07-03 | Disposition: A | Discharge status: Home / Self Care | Payer: Medicare Other | Source: Ambulatory Visit | Attending: Hematology & Oncology | Admitting: Hematology & Oncology

## 2019-07-03 ENCOUNTER — Other Ambulatory Visit: Payer: Self-pay | Admitting: Hematology & Oncology

## 2019-07-03 DIAGNOSIS — C3490 Malignant neoplasm of unspecified part of unspecified bronchus or lung: Secondary | ICD-10-CM | POA: Diagnosis not present

## 2019-07-03 DIAGNOSIS — Z79899 Other long term (current) drug therapy: Secondary | ICD-10-CM | POA: Diagnosis not present

## 2019-07-03 DIAGNOSIS — K219 Gastro-esophageal reflux disease without esophagitis: Secondary | ICD-10-CM | POA: Insufficient documentation

## 2019-07-03 DIAGNOSIS — F17210 Nicotine dependence, cigarettes, uncomplicated: Secondary | ICD-10-CM | POA: Insufficient documentation

## 2019-07-03 DIAGNOSIS — Z452 Encounter for adjustment and management of vascular access device: Secondary | ICD-10-CM | POA: Diagnosis not present

## 2019-07-03 DIAGNOSIS — I110 Hypertensive heart disease with heart failure: Secondary | ICD-10-CM | POA: Insufficient documentation

## 2019-07-03 HISTORY — PX: IR IMAGING GUIDED PORT INSERTION: IMG5740

## 2019-07-03 LAB — CBC
HCT: 40 % (ref 39.0–52.0)
Hemoglobin: 13.4 g/dL (ref 13.0–17.0)
MCH: 33.3 pg (ref 26.0–34.0)
MCHC: 33.5 g/dL (ref 30.0–36.0)
MCV: 99.3 fL (ref 80.0–100.0)
Platelets: 222 10*3/uL (ref 150–400)
RBC: 4.03 MIL/uL — ABNORMAL LOW (ref 4.22–5.81)
RDW: 12 % (ref 11.5–15.5)
WBC: 14.7 10*3/uL — ABNORMAL HIGH (ref 4.0–10.5)
nRBC: 0 % (ref 0.0–0.2)

## 2019-07-03 LAB — PROTIME-INR
INR: 0.9 (ref 0.8–1.2)
Prothrombin Time: 12.5 seconds (ref 11.4–15.2)

## 2019-07-03 IMAGING — US IR IMAGING GUIDED PORT INSERTION
2 series · 2 of 2 positions shown · non-contrast
Comparison: none

INDICATION: 68-year-old male referred for port catheter placement

[Series 1: (id) · 1 of 1 slices shown]
[im 1/1]
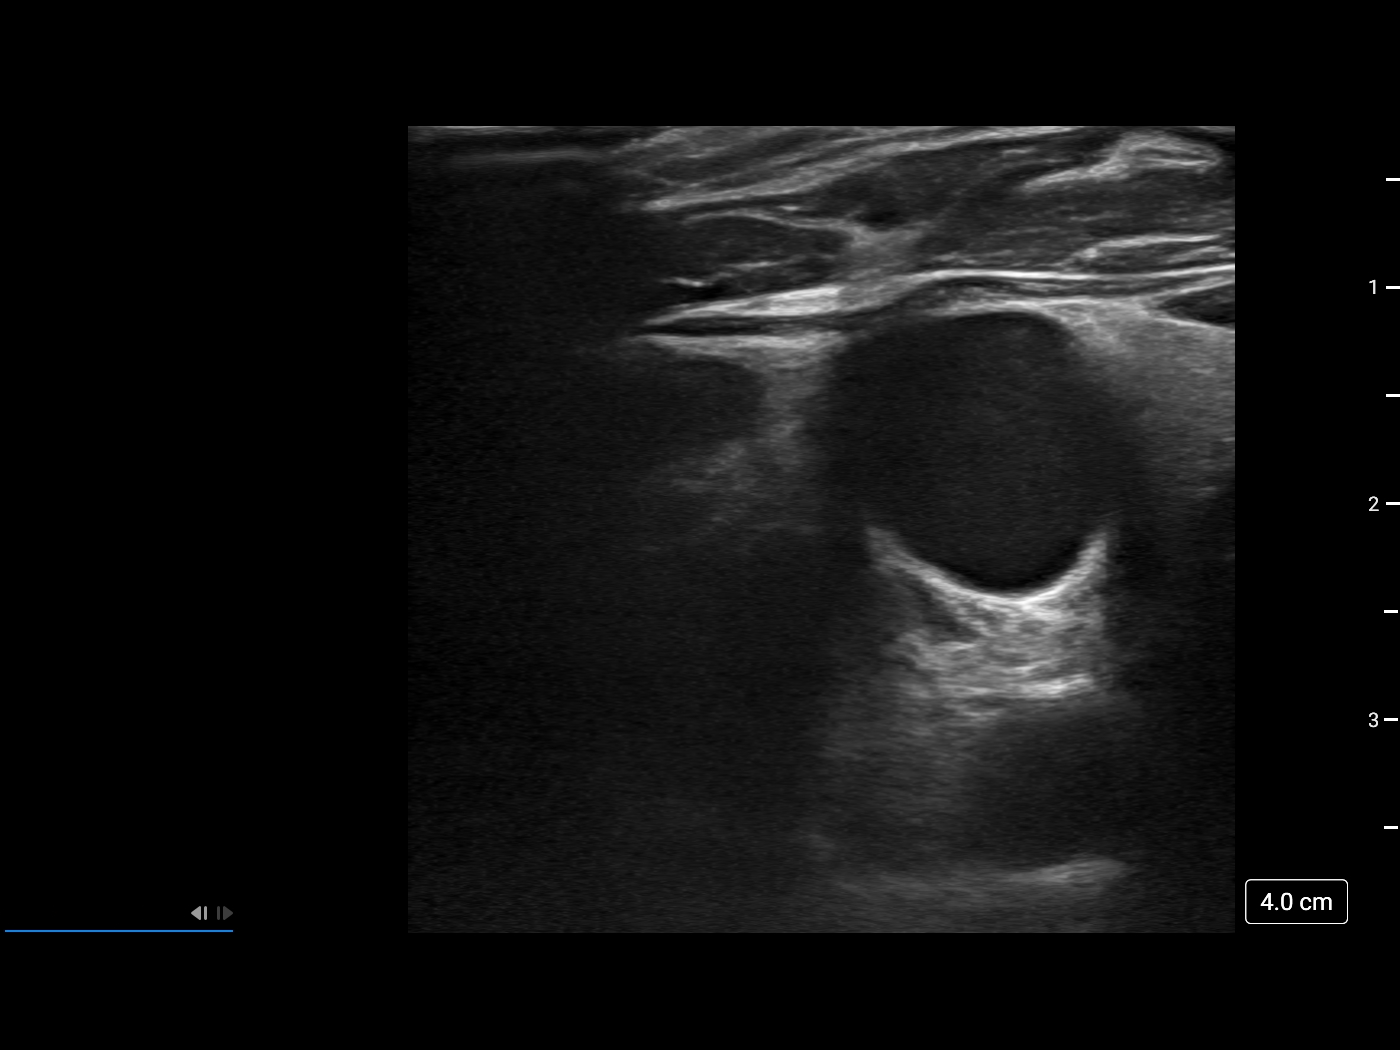

[Series 300: ir imaging guided port insertion · 1 of 1 slices shown]
[im 1/1]
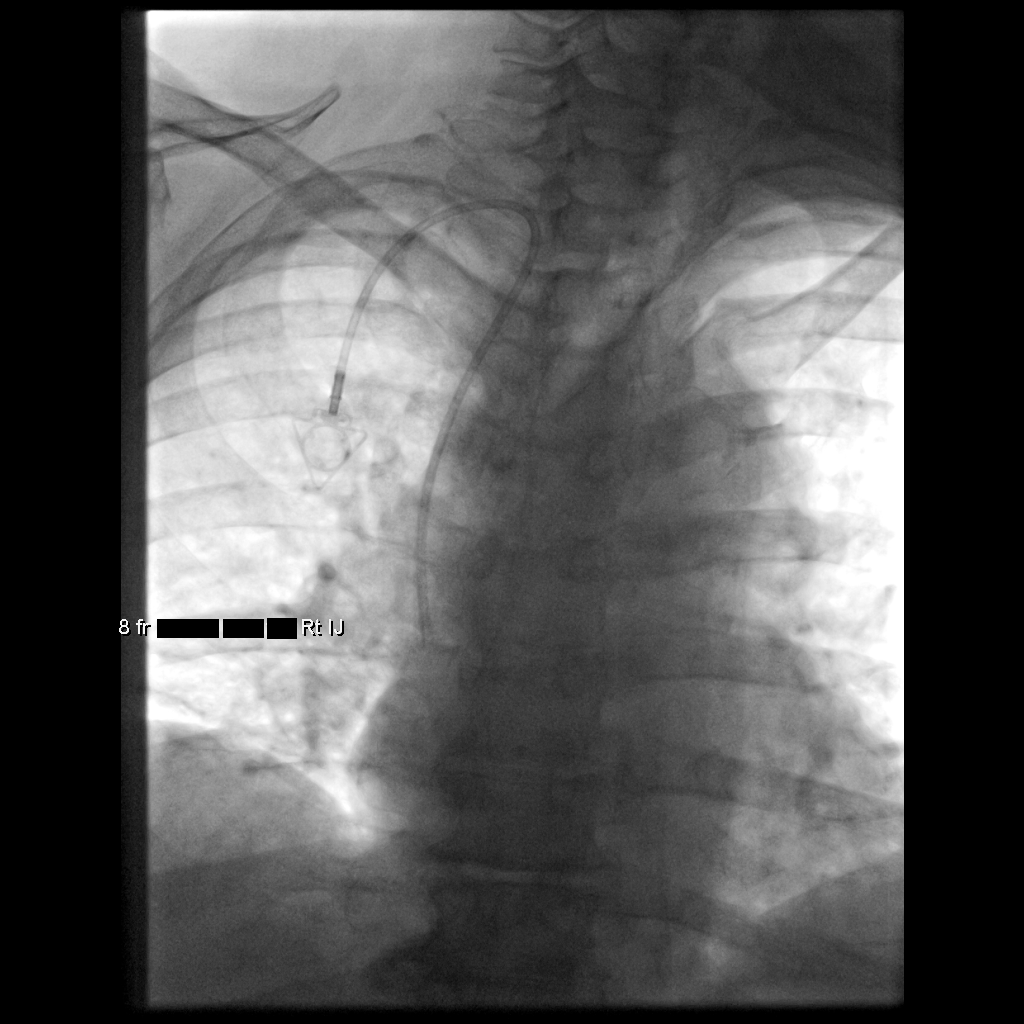

[2 of 2 positions shown; findings below may reference images not displayed]

EXAM:
IMAGE GUIDED PORT CATHETER PLACEMENT

MEDICATIONS:
2 g Ancef; The antibiotic was administered within an appropriate
time interval prior to skin puncture.

ANESTHESIA/SEDATION:
Moderate (conscious) sedation was employed during this procedure. A
total of Versed 2.0 mg and Fentanyl 100 mcg was administered
intravenously.

Moderate Sedation Time: 18 minutes minutes. The patient's level of
consciousness and vital signs were monitored continuously by
radiology nursing throughout the procedure under my direct
supervision.

FLUOROSCOPY TIME:  Fluoroscopy Time: 0 minutes 12 seconds (1 mGy).

COMPLICATIONS:
None

PROCEDURE:
The procedure, risks, benefits, and alternatives were explained to
the patient. Questions regarding the procedure were encouraged and
answered. The patient understands and consents to the procedure.

Ultrasound survey was performed with images stored and sent to PACs.

The right neck and chest was prepped with chlorhexidine, and draped
in the usual sterile fashion using maximum barrier technique (cap
and mask, sterile gown, sterile gloves, large sterile sheet, hand
hygiene and cutaneous antiseptic). Antibiotic prophylaxis was
provided with 2.0g Ancef administered IV one hour prior to skin
incision. Local anesthesia was attained by infiltration with 1%
lidocaine without epinephrine.

Ultrasound demonstrated patency of the right internal jugular vein,
and this was documented with an image. Under real-time ultrasound
guidance, this vein was accessed with a 21 gauge micropuncture
needle and image documentation was performed. A small dermatotomy
was made at the access site with an 11 scalpel. A 0.018" wire was
advanced into the SVC and used to estimate the length of the
internal catheter. The access needle exchanged for a 4F
micropuncture vascular sheath. The 0.018" wire was then removed and
a 0.035" wire advanced into the IVC.



The venous access site was then serially dilated and a peel away
vascular sheath placed over the wire. The wire was removed and the
port catheter advanced into position under fluoroscopic guidance.
The catheter tip is positioned in the cavoatrial junction. This was
documented with a spot image. The portacatheter was then tested and
found to flush and aspirate well. The port was flushed with saline
followed by 100 units/mL heparinized saline.

The pocket was then closed in two layers using first subdermal
inverted interrupted absorbable sutures followed by a running
subcuticular suture. The epidermis was then sealed with Dermabond.
The dermatotomy at the venous access site was also seal with
Dermabond.

Patient tolerated the procedure well and remained hemodynamically
stable throughout.

No complications encountered and no significant blood loss
encountered
IMPRESSION: Status post right IJ port catheter placement.

## 2019-07-03 MED ORDER — FENTANYL CITRATE (PF) 100 MCG/2ML IJ SOLN
INTRAMUSCULAR | Status: AC
  Filled 2019-07-03: qty 2

## 2019-07-03 MED ORDER — HEPARIN SOD (PORK) LOCK FLUSH 100 UNIT/ML IV SOLN
INTRAVENOUS | Status: AC
  Filled 2019-07-03: qty 5

## 2019-07-03 MED ORDER — FENTANYL CITRATE (PF) 100 MCG/2ML IJ SOLN
INTRAMUSCULAR | Status: AC | PRN
  Administered 2019-07-03 (×2): 50 ug via INTRAVENOUS

## 2019-07-03 MED ORDER — LIDOCAINE HCL (PF) 1 % IJ SOLN
INTRAMUSCULAR | Status: AC | PRN
  Administered 2019-07-03: 10 mL

## 2019-07-03 MED ORDER — MIDAZOLAM HCL 2 MG/2ML IJ SOLN
INTRAMUSCULAR | Status: AC
  Filled 2019-07-03: qty 4

## 2019-07-03 MED ORDER — CEFAZOLIN SODIUM-DEXTROSE 2-4 GM/100ML-% IV SOLN: 2.0000 g | Freq: Once | INTRAVENOUS | Status: AC

## 2019-07-03 MED ORDER — CEFAZOLIN SODIUM-DEXTROSE 2-4 GM/100ML-% IV SOLN
INTRAVENOUS | Status: AC
  Administered 2019-07-03: 2 g via INTRAVENOUS
  Filled 2019-07-03: qty 100

## 2019-07-03 MED ORDER — HEPARIN SOD (PORK) LOCK FLUSH 100 UNIT/ML IV SOLN
INTRAVENOUS | Status: AC | PRN
  Administered 2019-07-03: 500 [IU] via INTRAVENOUS

## 2019-07-03 MED ORDER — SODIUM CHLORIDE 0.9 % IV SOLN: INTRAVENOUS | Status: DC

## 2019-07-03 MED ORDER — LIDOCAINE HCL 1 % IJ SOLN
INTRAMUSCULAR | Status: AC
  Filled 2019-07-03: qty 20

## 2019-07-03 MED ORDER — MIDAZOLAM HCL 2 MG/2ML IJ SOLN
INTRAMUSCULAR | Status: AC | PRN
  Administered 2019-07-03 (×2): 1 mg via INTRAVENOUS

## 2019-07-03 NOTE — Progress Notes (Signed)
Spoke with patients wife, Colletta [place].  She confirmed she is patients driver today.  Updated her on estimated procedure time and d/c time.  Informed her we would call her when a more definite d/c time is known.  She voiced understanding.

## 2019-07-03 NOTE — Discharge Instructions (Signed)
Leave the dressing in place for 24 hours. You may remove it Thursday 07/04/19 at 1 pm.  You may shower after the dressing is removed.   Please monitor the site for signs of infection: swelling, redness, fever over 101, drainage oozing from the site.  Notify your MD if you notice these symptoms.   Implanted Port Insertion, Care After This sheet gives you information about how to care for yourself after your procedure. Your health care provider may also give you more specific instructions. If you have problems or questions, contact your health care provider. What can I expect after the procedure? After the procedure, it is common to have:  Discomfort at the port insertion site.  Bruising on the skin over the port. This should improve over 3-4 days. Follow these instructions at home: [facility] care  After your port is placed, you will get a manufacturer's information card. The card has information about your port. Keep this card with you at all times.  Take care of the port as told by your health care provider. Ask your health care provider if you or a family member can get training for taking care of the port at home. A home health care nurse may also take care of the port.  Make sure to remember what type of port you have. Incision care Follow instructions from your health care provider about how to take care of your port insertion site. Make sure you: ? Wash your hands with soap and water before and after you change your bandage (dressing). If soap and water are not available, use hand sanitizer. ? Change your dressing as told by your health care provider. ? Leave stitches (sutures), skin glue, or adhesive strips in place. These skin closures may need to stay in place for 2 weeks or longer. If adhesive strip edges start to loosen and curl up, you may trim the loose edges. Do not remove adhesive strips completely unless your health care provider tells you to do that.  Check your port insertion  site every day for signs of infection. Check for: ? Redness, swelling, or pain. ? Fluid or blood. ? Warmth. ? Pus or a bad smell. Activity  Return to your normal activities as told by your health care provider. Ask your health care provider what activities are safe for you.  Do not lift anything that is heavier than 10 lb (4.5 kg), or the limit that you are told, until your health care provider says that it is safe. General instructions  Take over-the-counter and prescription medicines only as told by your health care provider.  Do not take baths, swim, or use a hot tub until your health care provider approves. Ask your health care provider if you may take showers. You may only be allowed to take sponge baths.  Do not drive for 24 hours if you were given a sedative during your procedure.  Wear a medical alert bracelet in case of an emergency. This will tell any health care providers that you have a port.  Keep all follow-up visits as told by your health care provider. This is important. Contact a health care provider if:  You cannot flush your port with saline as directed, or you cannot draw blood from the port.  You have a fever or chills.  You have redness, swelling, or pain around your port insertion site.  You have fluid or blood coming from your port insertion site.  Your port insertion site feels warm to the  touch.  You have pus or a bad smell coming from the port insertion site. Get help right away if:  You have chest pain or shortness of breath.  You have bleeding from your port that you cannot control. Summary  Take care of the port as told by your health care provider. Keep the manufacturer's information card with you at all times.  Change your dressing as told by your health care provider.  Contact a health care provider if you have a fever or chills or if you have redness, swelling, or pain around your port insertion site.  Keep all follow-up visits as told  by your health care provider.  Moderate Conscious Sedation, Adult, Care After These instructions provide you with information about caring for yourself after your procedure. Your health care provider may also give you more specific instructions. Your treatment has been planned according to current medical practices, but problems sometimes occur. Call your health care provider if you have any problems or questions after your procedure. What can I expect after the procedure? After your procedure, it is common:  To feel sleepy for several hours.  To feel clumsy and have poor balance for several hours.  To have poor judgment for several hours.  To vomit if you eat too soon. Follow these instructions at home: For at least 24 hours after the procedure: Do not: ? Participate in activities where you could fall or become injured. ? Drive. ? Use heavy machinery. ? Drink alcohol. ? Take sleeping pills or medicines that cause drowsiness. ? Make important decisions or sign legal documents. ? Take care of children on your own.  Rest. Eating and drinking  Follow the diet recommended by your health care provider.  If you vomit: ? Drink water, juice, or soup when you can drink without vomiting. ? Make sure you have little or no nausea before eating solid foods. General instructions  Have a responsible adult stay with you until you are awake and alert.  Take over-the-counter and prescription medicines only as told by your health care provider.  If you smoke, do not smoke without supervision.  Keep all follow-up visits as told by your health care provider. This is important. Contact a health care provider if:  You keep feeling nauseous or you keep vomiting.  You feel light-headed.  You develop a rash.  You have a fever. Get help right away if:  You have trouble breathing. This information is not intended to replace advice given to you by your health care provider. Make sure you  discuss any questions you have with your health care provider. Document Revised: 05/05/2017 Document Reviewed: 09/12/2015 Elsevier Patient Education  2020 [employer].

## 2019-07-03 NOTE — Procedures (Signed)
Interventional Radiology Procedure Note  Procedure: Placement of a right IJ approach single lumen PowerPort.  Tip is positioned at the superior cavoatrial junction and catheter is ready for immediate use.  Complications: None Recommendations:  - Ok to shower tomorrow - Do not submerge for 7 days - Routine line care   Signed,  Maurion Walkowiak S. [name], DO   

## 2019-07-03 NOTE — Consult Note (Signed)
Chief Complaint: Patient was seen in consultation today for Port-A-Cath placement  Referring Physician(s): Ennever,Peter R  Supervising Physician: Corrie Mckusick  Patient Status: [facility] - Out-pt  History of Present Illness: Walter Hernandez is a 69 y.o. male, ex smoker,  with history of left leg melanoma in 2016 and now with newly diagnosed extensive stage small cell lung cancer presents today for Port-A-Cath placement for chemotherapy.   Past Medical History:  Diagnosis Date  . Cancer [facility])    recently discovered melanoma  . GERD (gastroesophageal reflux disease)   . Goals of care, counseling/discussion 06/19/2019  . History of hiatal hernia   . Hypertension    recently placed on new blood pressure med  . Malignant melanoma of skin of left lower leg ([place]) 07/28/2015    Past Surgical History:  Procedure Laterality Date  . BRONCHIAL NEEDLE ASPIRATION BIOPSY  06/05/2019   Procedure: Bronchial Needle Aspiration Biopsies;  Surgeon: Candee Furbish, MD;  Location: [place];  Service: Thoracic;;  . CARDIAC CATHETERIZATION     15 yrs ago...came out normal   -- hiatal hernia  . ENDOBRONCHIAL ULTRASOUND N/A 06/11/2019   Procedure: ENDOBRONCHIAL ULTRASOUND;  Surgeon: Garner Nash, DO;  Location: [place];  Service: Endoscopy;  Laterality: N/A;  . FINE NEEDLE ASPIRATION  06/11/2019   Procedure: FINE NEEDLE ASPIRATION (FNA) LINEAR;  Surgeon: Garner Nash, DO;  Location: [place];  Service: Endoscopy;;  . MELANOMA EXCISION WITH SENTINEL LYMPH NODE BIOPSY Left 09/29/2014   Procedure: WIDE EXCISION MELANOMA LEFT CALF WITH LEFT INGUINAL  SENTINEL LYMPH NODE BIOPSY;  Surgeon: Georganna Skeans, MD;  Location: [place];  Service: General;  Laterality: Left;  . skin lesion excised     non cancerous  . VIDEO BRONCHOSCOPY N/A 06/11/2019   Procedure: VIDEO BRONCHOSCOPY WITHOUT FLUORO;  Surgeon: Garner Nash, DO;  Location: [place];  Service: Endoscopy;  Laterality: N/A;  . VIDEO BRONCHOSCOPY  WITH ENDOBRONCHIAL ULTRASOUND N/A 06/05/2019   Procedure: VIDEO BRONCHOSCOPY WITH ENDOBRONCHIAL ULTRASOUND;  Surgeon: Candee Furbish, MD;  Location: [facility] OR;  Service: Thoracic;  Laterality: N/A;    Allergies: Patient has no known allergies.  Medications: Prior to Admission medications   Medication Sig Start Date End Date Taking? Authorizing Provider  acetaminophen (TYLENOL) 500 MG tablet Take 1,000 mg by mouth every 6 (six) hours as needed for moderate pain.   Yes [provider]  ALPRAZolam (XANAX) 0.5 MG tablet Take 1 tablet (0.5 mg total) by mouth every 6 (six) hours as needed for anxiety. 06/10/19  Yes Volanda Napoleon, MD  amLODipine (NORVASC) 5 MG tablet Take 5 mg by mouth daily.    Yes [provider]  dexamethasone (DECADRON) 4 MG tablet Take 1 tablet (4 mg total) by mouth 2 (two) times daily. 06/17/19  Yes Hayden Pedro, PA-C  doxycycline (VIBRA-TABS) 100 MG tablet Take 1 tablet (100 mg total) by mouth 2 (two) times daily. 07/02/19  Yes Volanda Napoleon, MD  finasteride (PROSCAR) 5 MG tablet Take 1 tablet (5 mg total) by mouth daily. 06/20/19  Yes Volanda Napoleon, MD  IBUPROFEN & ACETAMINOPHEN PO Take 1-2 tablets by mouth every 6 (six) hours as needed (pain).   Yes [provider]  memantine (NAMENDA) 5 MG tablet Starting 06/18/19 Take one tab po daily. Starting 06/25/19 Take one tab po q am and one tab po q pm. Starting 1/26/21Take two tabs po q am, and one tab po q pm. Starting 07/09/19 Take two tabs  po q am and two tabs po q pm until 12/03/19. 06/17/19  Yes Hayden Pedro, PA-C  Multiple Vitamin (MULTIVITAMIN) capsule Take 1 capsule by mouth daily.   Yes [provider]  Naphazoline-Pheniramine (OPCON-A OP) Place 2 drops into both eyes as needed (for dry eyes).   Yes [provider]  nystatin (MYCOSTATIN) 100000 UNIT/ML suspension Take 5 mLs (500,000 Units total) by mouth 4 (four) times daily. 06/28/19  Yes Tyler Pita, MD    omeprazole (PRILOSEC) 40 MG capsule Take 40 mg by mouth daily.   Yes [provider]  sucralfate (CARAFATE) 1 g tablet Take 1 tablet (1 g total) by mouth 4 (four) times daily. Dissolve each tablet in 15 cc water. 06/26/19  Yes Kyung Rudd, MD  tamsulosin (FLOMAX) 0.4 MG CAPS capsule Take 2 capsules (0.8 mg total) by mouth daily. Patient taking differently: Take 0.4 mg by mouth 2 (two) times daily.  06/13/19  Yes Volanda Napoleon, MD  LORazepam (ATIVAN) 0.5 MG tablet 1 tablet po 30 minutes prior to radiation or MRI 06/17/19   Hayden Pedro, PA-C  nicotine (NICODERM CQ - DOSED IN MG/24 HOURS) 21 mg/24hr patch PLACE 1 PATCH (21 MG TOTAL) ONTO THE SKIN DAILY. 07/01/19   Volanda Napoleon, MD  ondansetron (ZOFRAN) 4 MG tablet Take 1 tablet (4 mg total) by mouth every 6 (six) hours as needed for nausea. Patient not taking: Reported on 06/20/2019 06/05/19   Little Ishikawa, MD     History reviewed. No pertinent family history.  Social History   Socioeconomic History  . Marital status: Married    Spouse name: Not on file  . Number of children: Not on file  . Years of education: Not on file  . Highest education level: Not on file  Occupational History  . Not on file  Tobacco Use  . Smoking status: Current Every Day Smoker    Packs/day: 1.00    Years: 50.00    Pack years: 50.00    Types: Cigarettes  . Smokeless tobacco: Never Used  . Tobacco comment: has quit for about 2 week is using the patch, still only smokes 2-4 daily.  Substance and Sexual Activity  . Alcohol use: Not on file    Comment: socially....beer  wine & liquor  . Drug use: No  . Sexual activity: Not on file  Other Topics Concern  . Not on file  Social History Narrative  . Not on file   Social Determinants of Health   Financial Resource Strain:   . Difficulty of Paying Living Expenses: Not on file  Food Insecurity:   . Worried About Charity fundraiser in the Last Year: Not on file  . Ran Out of  Food in the Last Year: Not on file  Transportation Needs:   . Lack of Transportation (Medical): Not on file  . Lack of Transportation (Non-Medical): Not on file  Physical Activity:   . Days of Exercise per Week: Not on file  . Minutes of Exercise per Session: Not on file  Stress:   . Feeling of Stress : Not on file  Social Connections:   . Frequency of Communication with Friends and Family: Not on file  . Frequency of Social Gatherings with Friends and Family: Not on file  . Attends Religious Services: Not on file  . Active Member of Clubs or Organizations: Not on file  . Attends Archivist Meetings: Not on file  . Marital Status: Not  on file      Review of Systems denies fever, headache, chest pain, dyspnea, cough, abdominal/back pain, nausea, vomiting or bleeding.  Vital Signs: BP 124/80   Pulse 68   Temp 98.3 F (36.8 C) (Oral)   Resp 16   SpO2 100%   Physical Exam awake, alert.  Chest clear to auscultation bilaterally.  Heart with regular rate and rhythm.  Abdomen soft, positive bowel sounds, nontender.  No lower extremity edema.  Imaging: CT HEAD WO CONTRAST  Result Date: 06/03/2019 CLINICAL DATA:  Left arm weakness over the last week. EXAM: CT HEAD WITHOUT CONTRAST TECHNIQUE: Contiguous axial images were obtained from the base of the skull through the vertex without intravenous contrast. COMPARISON:  None. FINDINGS: Brain: No abnormality is seen affecting the brainstem or cerebellum. Left cerebral hemisphere shows subcortical white matter edema in the left posterior parietal region and at the left frontal vertex. Right cerebral hemisphere shows a large region of vasogenic edema within the right frontoparietal white matter with intact overlying cortex. Probable cortical thickening. These findings are not typical of ordinary infarctions and raise the possibility of either metastatic disease, multifocal primary brain malignancy or lymphoma. Cerebritis could also be  possible. Recommend MRI with and without contrast when able. No hydrocephalus. No midline shift. No extra-axial fluid collection. Vascular: There is atherosclerotic calcification of the major vessels at the base of the brain. Skull: Negative Sinuses/Orbits: Clear/normal Other: None IMPRESSION: Pronounced abnormality of the right frontoparietal region with extensive vasogenic white matter edema and probable abnormal cortical thickening and hyperdensity. Smaller area of vasogenic edema within the left posterior parietal subcortical white matter and the left frontal subcortical white matter. Most likely diagnosis is occult metastatic disease. Multifocal primary brain malignancy and lymphoma are also possible. Lastly, septic emboli/cerebritis are considered. Recommend brain MRI with and without contrast. Electronically Signed   By: Nelson Chimes M.D.   On: 06/03/2019 13:24   CT Chest W Contrast  Result Date: 06/03/2019 CLINICAL DATA:  Brain MR demonstrating metastatic disease. History of melanoma 4 years ago. EXAM: CT CHEST, ABDOMEN, AND PELVIS WITH CONTRAST TECHNIQUE: Multidetector CT imaging of the chest, abdomen and pelvis was performed following the standard protocol during bolus administration of intravenous contrast. CONTRAST:  111mL OMNIPAQUE IOHEXOL 300 MG/ML  SOLN COMPARISON:  Chest radiograph report 04/29/99 brain MR of earlier today. FINDINGS: CT CHEST FINDINGS Cardiovascular: Mild motion degradation involving the upper and mid chest. Aortic and branch vessel atherosclerosis. Tortuous thoracic aorta. Normal heart size, without pericardial effusion. No central pulmonary embolism, on this non-dedicated study. Mediastinum/Nodes: Extensive mediastinal adenopathy. Right paratracheal node measures 1.6 cm on 18/3. Subcarinal node measures 1.5 cm on 29/3. Right hilar adenopathy at 1.6 cm on 30/3. Prevascular node of 1.0 cm on 22/3. Lungs/Pleura: No pleural fluid. Bronchial wall thickening is most significant in  the right middle and right lower lobes, including on 82/5. Centrilobular emphysema. Right middle lobe 5 mm pulmonary nodule on 96/5. Right lower lobe 1.0 cm nodule including on 84/5. Likely calcified 2 mm posterior right upper lobe pulmonary nodule on 63/5. Musculoskeletal: No acute osseous abnormality. CT ABDOMEN PELVIS FINDINGS Hepatobiliary: Vague hypoattenuating liver lesions bilaterally. Example in the right hepatic lobe at 8 mm on 54/3 and within the lateral segment left liver lobe 1.1 cm on 57/3. Normal gallbladder, without biliary ductal dilatation. Pancreas: Normal, without mass or ductal dilatation. Spleen: Normal in size, without focal abnormality. Adrenals/Urinary Tract: Normal adrenal glands. Normal left kidney. Too small to characterize interpolar right renal lesion.  No hydronephrosis. Bladder wall irregularity with small saccules posteriorly. Stomach/Bowel: Proximal gastric underdistention. Scattered colonic diverticula. Normal terminal ileum. Normal small bowel. Vascular/Lymphatic: Advanced aortic and branch vessel atherosclerosis. Porta hepatis adenopathy, including a nodal mass of 2.6 x 3.1 cm on 66/3. No pelvic sidewall adenopathy. Reproductive: Mild prostatomegaly. Other: No significant free fluid. No evidence of omental or peritoneal disease. Musculoskeletal: Thoracolumbar spondylosis. S-shaped thoracolumbar spine curvature. IMPRESSION: 1. Thoracoabdominal adenopathy and liver lesions, favored to represent metastatic disease. Given emphysema and small pulmonary nodules, differential considerations include metastatic bronchogenic carcinoma and melanoma. 2. Motion degraded evaluation of the upper chest. 3. Aortic atherosclerosis (ICD10-I70.0) and emphysema (ICD10-J43.9). 4. Prostatomegaly with a component of bladder outlet obstruction. Electronically Signed   By: Abigail Miyamoto M.D.   On: 06/03/2019 20:02   MR Brain W Wo Contrast  Result Date: 06/14/2019 CLINICAL DATA:  69 year old male with  metastatic disease to the brain. History of melanoma 4 years ago. Stereotactic treatment planning. EXAM: MRI HEAD WITHOUT AND WITH CONTRAST TECHNIQUE: Multiplanar, multiecho pulse sequences of the brain and surrounding structures were obtained without and with intravenous contrast. CONTRAST:  23mL GADAVIST GADOBUTROL 1 MMOL/ML IV SOLN COMPARISON:  Brain MRI 06/03/2019 and earlier. FINDINGS: Brain: On today's exam the multiple scattered enhancing brain lesions are in some ways most conspicuous on DWI (for example series 3, image 36). The largest mass is redemonstrated in the right posterior frontal and anterior parietal lobes measuring up to 37 millimeters diameter (series 9, image 118 and series 10, image 31). The smallest lesion is in the left periatrial white matter measuring 3-4 millimeters. A total of 6 lesions are identified which are T1 hypointense, T2 hyperintense, and demonstrate enhancement which is most conspicuous on series 10. Each of these has not significantly changed in size or configuration since 06/03/2019. All are annotated on series 9. No new lesion is identified. No dural enhancement. No hemosiderin is associated with the enhancing lesions, although there are a few scattered chronic microhemorrhages (series 6, image 80, 65). T2 and FLAIR hyperintensity in keeping with vasogenic edema is most pronounced in the superior right hemisphere and stable to mildly improved since December. No significant intracranial mass effect. There is also mild to moderate edema in the left superior frontal gyrus and the left parietal lobe. No superimposed restricted diffusion suggestive of acute infarction. No ventriculomegaly, extra-axial collection or acute intracranial hemorrhage. Cervicomedullary junction and pituitary are within normal limits. No new gray or white matter signal abnormality. Vascular: Major intracranial vascular flow voids are stable. The major dural venous sinuses are enhancing and appear to be  patent. Skull and upper cervical spine: Negative visible cervical spine and spinal cord. Visualized bone marrow signal is within normal limits. Sinuses/Orbits: Negative orbits. Trace paranasal sinus mucosal thickening is stable. Other: Mastoids remain clear. Visible internal auditory structures appear normal. IMPRESSION: 1. Six brain lesions ranging from 4 mm to 37 mm diameter have not significantly changed since 06/03/2019 and are most compatible with metastases. Lesion enhancement is most apparent today on series 10, and the lesions are also highly conspicuous on DWI. Associated vasogenic edema is stable to mildly improved. No significant intracranial mass effect. 2. No new metastatic disease or new intracranial abnormality identified. Electronically Signed   By: Genevie Ann M.D.   On: 06/14/2019 12:16   MR Brain W and Wo Contrast  Result Date: 06/03/2019 CLINICAL DATA:  Left arm weakness.  Abnormal CT head EXAM: MRI HEAD WITHOUT AND WITH CONTRAST TECHNIQUE: Multiplanar, multiecho pulse sequences of the  brain and surrounding structures were obtained without and with intravenous contrast. CONTRAST:  9mL GADAVIST GADOBUTROL 1 MMOL/ML IV SOLN COMPARISON:  CT head 06/03/2019 FINDINGS: Brain: Multiple enhancing mass lesions are present in the brain. Largest in the right posterior frontal lobe shows homogeneous enhancement measuring 35 x 27 mm. The mass appears to infiltrate and thicken the cortex with extensive adjacent vasogenic edema. This corresponds to the primary CT abnormality Additional smaller enhancing mass lesions are present. 6 mm enhancing lesion right frontal lobe. 11 mm enhancing lesion left frontal lobe with mild edema. 9 mm enhancing lesion left frontal lobe. 3 mm enhancing lesion left temporoparietal periventricular white matter. 12 mm enhancing lesion left posterior parietal lobe with edema. These lesions show significant restricted diffusion. No associated hemorrhage Ventricle size normal.  No  midline shift. Vascular: Normal arterial flow voids. Skull and upper cervical spine: No focal skeletal lesion. Sinuses/Orbits: Mild mucosal edema paranasal sinuses.  Normal orbit Other: None IMPRESSION: Multiple enhancing mass lesions in the brain with surrounding edema compatible with metastatic disease. Patient has history of melanoma. Electronically Signed   By: Franchot Gallo M.D.   On: 06/03/2019 18:13   CT ABDOMEN PELVIS W CONTRAST  Result Date: 06/03/2019 CLINICAL DATA:  Brain MR demonstrating metastatic disease. History of melanoma 4 years ago. EXAM: CT CHEST, ABDOMEN, AND PELVIS WITH CONTRAST TECHNIQUE: Multidetector CT imaging of the chest, abdomen and pelvis was performed following the standard protocol during bolus administration of intravenous contrast. CONTRAST:  168mL OMNIPAQUE IOHEXOL 300 MG/ML  SOLN COMPARISON:  Chest radiograph report 04/29/99 brain MR of earlier today. FINDINGS: CT CHEST FINDINGS Cardiovascular: Mild motion degradation involving the upper and mid chest. Aortic and branch vessel atherosclerosis. Tortuous thoracic aorta. Normal heart size, without pericardial effusion. No central pulmonary embolism, on this non-dedicated study. Mediastinum/Nodes: Extensive mediastinal adenopathy. Right paratracheal node measures 1.6 cm on 18/3. Subcarinal node measures 1.5 cm on 29/3. Right hilar adenopathy at 1.6 cm on 30/3. Prevascular node of 1.0 cm on 22/3. Lungs/Pleura: No pleural fluid. Bronchial wall thickening is most significant in the right middle and right lower lobes, including on 82/5. Centrilobular emphysema. Right middle lobe 5 mm pulmonary nodule on 96/5. Right lower lobe 1.0 cm nodule including on 84/5. Likely calcified 2 mm posterior right upper lobe pulmonary nodule on 63/5. Musculoskeletal: No acute osseous abnormality. CT ABDOMEN PELVIS FINDINGS Hepatobiliary: Vague hypoattenuating liver lesions bilaterally. Example in the right hepatic lobe at 8 mm on 54/3 and within the  lateral segment left liver lobe 1.1 cm on 57/3. Normal gallbladder, without biliary ductal dilatation. Pancreas: Normal, without mass or ductal dilatation. Spleen: Normal in size, without focal abnormality. Adrenals/Urinary Tract: Normal adrenal glands. Normal left kidney. Too small to characterize interpolar right renal lesion. No hydronephrosis. Bladder wall irregularity with small saccules posteriorly. Stomach/Bowel: Proximal gastric underdistention. Scattered colonic diverticula. Normal terminal ileum. Normal small bowel. Vascular/Lymphatic: Advanced aortic and branch vessel atherosclerosis. Porta hepatis adenopathy, including a nodal mass of 2.6 x 3.1 cm on 66/3. No pelvic sidewall adenopathy. Reproductive: Mild prostatomegaly. Other: No significant free fluid. No evidence of omental or peritoneal disease. Musculoskeletal: Thoracolumbar spondylosis. S-shaped thoracolumbar spine curvature. IMPRESSION: 1. Thoracoabdominal adenopathy and liver lesions, favored to represent metastatic disease. Given emphysema and small pulmonary nodules, differential considerations include metastatic bronchogenic carcinoma and melanoma. 2. Motion degraded evaluation of the upper chest. 3. Aortic atherosclerosis (ICD10-I70.0) and emphysema (ICD10-J43.9). 4. Prostatomegaly with a component of bladder outlet obstruction. Electronically Signed   By: Adria Devon.D.  On: 06/03/2019 20:02   NM PET Image Initial (PI) Skull Base To Thigh  Result Date: 06/17/2019 CLINICAL DATA:  Initial treatment strategy for metastatic small cell lung cancer. EXAM: NUCLEAR MEDICINE PET SKULL BASE TO THIGH TECHNIQUE: 7.1 mCi F-18 FDG was injected intravenously. Full-ring PET imaging was performed from the skull base to thigh after the radiotracer. CT data was obtained and used for attenuation correction and anatomic localization. Fasting blood glucose: 123 mg/dl COMPARISON:  CT scan 06/03/2019 FINDINGS: Mediastinal blood pool activity: SUV max 2.67  Liver activity: SUV max NA NECK: 2.2 cm nodal lesion in the supraclavicular fossa on the right side is hypermetabolic with SUV max of 69.79. Right-sided Retroclavicular lymph node measures 18.5 mm and has an SUV max of 10.56. Incidental CT findings: Advanced carotid artery calcifications. CHEST: Bulky hypermetabolic mediastinal and hilar lymphadenopathy. Hypermetabolic prevascular lymph nodes have an SUV max of 9.72. 16 mm right paratracheal node has an SUV max of 10.39. 2 cm AP window node has an SUV max of 9.32. Subcarinal adenopathy has an SUV max of 8.88. 17 mm right hilar lymph node has an SUV max of 10.31. 9 mm right lower lobe pulmonary nodule is not definitely hypermetabolic and could be a metastatic focus. Difficult to identify the primary lung lesion. It could be in the azygoesophageal recess of the right lower lobe blending in with the subcarinal adenopathy. Incidental CT findings: none ABDOMEN/PELVIS: There are 4 small hypermetabolic metastatic hepatic lesions. The segment 8 lesion measures 13 mm and has an SUV max of 6.09. Enlarged hypermetabolic upper abdominal lymphadenopathy. 17 mm celiac axis lymph node has an SUV max of 7.68. 25 mm periportal adenopathy has an SUV max of 10.68. Incidental CT findings: No retroperitoneal or pelvic lymphadenopathy. No evidence of adrenal gland metastatic disease. SKELETON: No findings suspicious for osseous metastatic disease. Incidental CT findings: none IMPRESSION: 1. Enlarged and hypermetabolic right supraclavicular, mediastinal and hilar lymphadenopathy. The primary lung lesion is not identified for certain as detailed above. Possible primary lesions in the right lower lobe. 2. Metastatic hepatic disease and upper abdominal metastatic adenopathy. 3. No findings for osseous metastatic disease. Electronically Signed   By: Marijo Sanes M.D.   On: 06/17/2019 10:41    Labs:  CBC: Recent Labs    06/04/19 1656 06/05/19 0333 06/20/19 1519 07/03/19 1008  WBC  10.8* 19.0* 24.2* 14.7*  HGB 15.7 15.2 15.2 13.4  HCT 44.7 42.5 43.0 40.0  PLT 287 282 255 222    COAGS: Recent Labs    06/03/19 1227 06/04/19 1656 06/05/19 0333  INR 0.9 1.0 1.0  APTT 33 29  --     BMP: Recent Labs    06/04/19 0150 06/04/19 1656 06/05/19 0333 06/20/19 1519  NA 138 133* 136 131*  K 4.2 4.0 3.8 4.3  CL 102 100 101 93*  CO2 25 21* 24 27  GLUCOSE 140* 246* 138* 154*  BUN 7* 10 10 19   CALCIUM 9.6 9.2 9.3 9.0  CREATININE 0.67 0.76 0.60* 0.73  GFRNONAA >60 >60 >60 >60  GFRAA >60 >60 >60 >60    LIVER FUNCTION TESTS: Recent Labs    06/04/19 0150 06/04/19 1656 06/05/19 0333 06/20/19 1519  BILITOT 0.8 0.8 0.6 0.6  AST 24 24 23 20   ALT 13 14 14  47*  ALKPHOS 72 67 63 61  PROT 6.9 6.5 6.4* 6.0*  ALBUMIN 3.7 3.4* 3.4* 4.0    TUMOR MARKERS: No results for input(s): AFPTM, CEA, CA199, CHROMGRNA in the last 8760  hours.  Assessment and Plan: 69 y.o. male, ex smoker,  with history of left leg melanoma in 2016 and now with newly diagnosed extensive stage small cell lung cancer presents today for Port-A-Cath placement for chemotherapy.Risks and benefits of image guided port-a-catheter placement was discussed with the patient including, but not limited to bleeding, infection, pneumothorax, or fibrin sheath development and need for additional procedures.  All of the patient's questions were answered, patient is agreeable to proceed. Consent signed and in chart.     Thank you for this interesting consult.  I greatly enjoyed meeting Dyron Kawano and look forward to participating in their care.  A copy of this report was sent to the requesting provider on this date.  Electronically Signed: D. Rowe Robert, PA-C 07/03/2019, 10:38 AM   I spent a total of 25 minutes    in face to face in clinical consultation, greater than 50% of which was counseling/coordinating care for Port-A-Cath placement

## 2019-07-05 ENCOUNTER — Other Ambulatory Visit: Payer: Self-pay | Admitting: *Deleted

## 2019-07-05 DIAGNOSIS — C4372 Malignant melanoma of left lower limb, including hip: Secondary | ICD-10-CM

## 2019-07-05 DIAGNOSIS — C3490 Malignant neoplasm of unspecified part of unspecified bronchus or lung: Secondary | ICD-10-CM

## 2019-07-06 NOTE — Progress Notes (Signed)
  Radiation Oncology         [phone] ________________________________  Name: Walter Hernandez MRN: [id number]  Date: 07/02/2019  DOB: 05-Jun-1951  End of Treatment Note  Diagnosis:   69 yo man with brain metastases from extensive stage small cell lung cancer     Indication for treatment:  Palliation       Radiation treatment dates:   1/13-1/26/21  Site/dose:   The whole brain was treated to 30 Gy in 10 fractions of 3 Gy  Beams/energy:   Right and Left radiation fields were treated using 6 MV X-rays with custom MLC collimation to shield the eyes and face.  The patient was immobilized with a thermoplastic mask and isocenter was verified with weekly port films.  Narrative: The patient tolerated radiation treatment relatively well.     Plan: The patient has completed radiation treatment. The patient will return to radiation oncology clinic for routine followup in one month. I advised them to call or return sooner if they have any questions or concerns related to their recovery or treatment. ________________________________  Sheral Apley. Tammi Klippel, M.D.

## 2019-07-08 ENCOUNTER — Inpatient Hospital Stay: Payer: Medicare Other

## 2019-07-08 ENCOUNTER — Inpatient Hospital Stay: Payer: Medicare Other | Attending: Hematology & Oncology

## 2019-07-08 ENCOUNTER — Other Ambulatory Visit: Payer: Self-pay | Admitting: *Deleted

## 2019-07-08 ENCOUNTER — Other Ambulatory Visit: Payer: Self-pay

## 2019-07-08 ENCOUNTER — Encounter: Payer: Self-pay | Admitting: *Deleted

## 2019-07-08 VITALS — BP 130/70 | HR 78 | Temp 99.3°F | Resp 20

## 2019-07-08 DIAGNOSIS — C787 Secondary malignant neoplasm of liver and intrahepatic bile duct: Secondary | ICD-10-CM | POA: Diagnosis not present

## 2019-07-08 DIAGNOSIS — Z5111 Encounter for antineoplastic chemotherapy: Secondary | ICD-10-CM | POA: Diagnosis not present

## 2019-07-08 DIAGNOSIS — C4372 Malignant melanoma of left lower limb, including hip: Secondary | ICD-10-CM

## 2019-07-08 DIAGNOSIS — Z79899 Other long term (current) drug therapy: Secondary | ICD-10-CM | POA: Insufficient documentation

## 2019-07-08 DIAGNOSIS — C7931 Secondary malignant neoplasm of brain: Secondary | ICD-10-CM | POA: Diagnosis not present

## 2019-07-08 DIAGNOSIS — C3431 Malignant neoplasm of lower lobe, right bronchus or lung: Secondary | ICD-10-CM | POA: Insufficient documentation

## 2019-07-08 DIAGNOSIS — R531 Weakness: Secondary | ICD-10-CM | POA: Diagnosis not present

## 2019-07-08 DIAGNOSIS — Z5189 Encounter for other specified aftercare: Secondary | ICD-10-CM | POA: Diagnosis not present

## 2019-07-08 DIAGNOSIS — C3490 Malignant neoplasm of unspecified part of unspecified bronchus or lung: Secondary | ICD-10-CM

## 2019-07-08 LAB — CBC WITH DIFFERENTIAL (CANCER CENTER ONLY)
Abs Immature Granulocytes: 3.48 10*3/uL — ABNORMAL HIGH (ref 0.00–0.07)
Basophils Absolute: 0 10*3/uL (ref 0.0–0.1)
Basophils Relative: 0 %
Eosinophils Absolute: 0 10*3/uL (ref 0.0–0.5)
Eosinophils Relative: 0 %
HCT: 38.9 % — ABNORMAL LOW (ref 39.0–52.0)
Hemoglobin: 13.2 g/dL (ref 13.0–17.0)
Immature Granulocytes: 17 %
Lymphocytes Relative: 8 %
Lymphs Abs: 1.6 10*3/uL (ref 0.7–4.0)
MCH: 33.1 pg (ref 26.0–34.0)
MCHC: 33.9 g/dL (ref 30.0–36.0)
MCV: 97.5 fL (ref 80.0–100.0)
Monocytes Absolute: 1.4 10*3/uL — ABNORMAL HIGH (ref 0.1–1.0)
Monocytes Relative: 7 %
Neutro Abs: 14.2 10*3/uL — ABNORMAL HIGH (ref 1.7–7.7)
Neutrophils Relative %: 68 %
Platelet Count: 264 10*3/uL (ref 150–400)
RBC: 3.99 MIL/uL — ABNORMAL LOW (ref 4.22–5.81)
RDW: 12.2 % (ref 11.5–15.5)
WBC Count: 20.6 10*3/uL — ABNORMAL HIGH (ref 4.0–10.5)
nRBC: 0 % (ref 0.0–0.2)

## 2019-07-08 LAB — CMP (CANCER CENTER ONLY)
ALT: 20 U/L (ref 0–44)
AST: 15 U/L (ref 15–41)
Albumin: 3.8 g/dL (ref 3.5–5.0)
Alkaline Phosphatase: 62 U/L (ref 38–126)
Anion gap: 7 (ref 5–15)
BUN: 12 mg/dL (ref 8–23)
CO2: 32 mmol/L (ref 22–32)
Calcium: 9.5 mg/dL (ref 8.9–10.3)
Chloride: 98 mmol/L (ref 98–111)
Creatinine: 0.7 mg/dL (ref 0.61–1.24)
GFR, Est AFR Am: >60 mL/min (ref >60)
GFR, Estimated: >60 mL/min (ref >60)
Glucose, Bld: 115 mg/dL — ABNORMAL HIGH (ref 70–99)
Potassium: 4.6 mmol/L (ref 3.5–5.1)
Sodium: 137 mmol/L (ref 135–145)
Total Bilirubin: 0.4 mg/dL (ref 0.3–1.2)
Total Protein: 6.4 g/dL — ABNORMAL LOW (ref 6.5–8.1)

## 2019-07-08 MED ORDER — ONDANSETRON HCL 8 MG PO TABS: 8.0000 mg | ORAL_TABLET | Freq: Two times a day (BID) | ORAL | 1 refills | Status: DC | PRN

## 2019-07-08 MED ORDER — LORAZEPAM 0.5 MG PO TABS: 0.5000 mg | ORAL_TABLET | Freq: Four times a day (QID) | ORAL | 0 refills | Status: DC | PRN

## 2019-07-08 MED ORDER — DEXAMETHASONE SODIUM PHOSPHATE 10 MG/ML IJ SOLN
10.0000 mg | Freq: Once | INTRAMUSCULAR | Status: AC
  Administered 2019-07-08: 10 mg via INTRAVENOUS

## 2019-07-08 MED ORDER — LIDOCAINE-PRILOCAINE 2.5-2.5 % EX CREA: TOPICAL_CREAM | CUTANEOUS | 3 refills | Status: DC

## 2019-07-08 MED ORDER — SODIUM CHLORIDE 0.9 % IV SOLN
150.0000 mg | Freq: Once | INTRAVENOUS | Status: AC
  Administered 2019-07-08: 12:00:00 150 mg via INTRAVENOUS
  Filled 2019-07-08: qty 150

## 2019-07-08 MED ORDER — PALONOSETRON HCL INJECTION 0.25 MG/5ML
0.2500 mg | Freq: Once | INTRAVENOUS | Status: AC
  Administered 2019-07-08: 0.25 mg via INTRAVENOUS

## 2019-07-08 MED ORDER — SODIUM CHLORIDE 0.9 % IV SOLN
1200.0000 mg | Freq: Once | INTRAVENOUS | Status: AC
  Administered 2019-07-08: 1200 mg via INTRAVENOUS
  Filled 2019-07-08: qty 20

## 2019-07-08 MED ORDER — PROCHLORPERAZINE MALEATE 10 MG PO TABS: 10.0000 mg | ORAL_TABLET | Freq: Four times a day (QID) | ORAL | 1 refills | Status: DC | PRN

## 2019-07-08 MED ORDER — SODIUM CHLORIDE 0.9 % IV SOLN
100.0000 mg/m2 | Freq: Once | INTRAVENOUS | Status: AC
  Administered 2019-07-08: 15:00:00 180 mg via INTRAVENOUS
  Filled 2019-07-08: qty 9

## 2019-07-08 MED ORDER — PALONOSETRON HCL INJECTION 0.25 MG/5ML
INTRAVENOUS | Status: AC
  Filled 2019-07-08: qty 5

## 2019-07-08 MED ORDER — HEPARIN SOD (PORK) LOCK FLUSH 100 UNIT/ML IV SOLN
500.0000 [IU] | Freq: Once | INTRAVENOUS | Status: AC | PRN
  Administered 2019-07-08: 500 [IU]
  Filled 2019-07-08: qty 5

## 2019-07-08 MED ORDER — DEXAMETHASONE SODIUM PHOSPHATE 10 MG/ML IJ SOLN
INTRAMUSCULAR | Status: AC
  Filled 2019-07-08: qty 1

## 2019-07-08 MED ORDER — SODIUM CHLORIDE 0.9 % IV SOLN
440.0000 mg | Freq: Once | INTRAVENOUS | Status: AC
  Administered 2019-07-08: 14:00:00 440 mg via INTRAVENOUS
  Filled 2019-07-08: qty 44

## 2019-07-08 MED ORDER — SODIUM CHLORIDE 0.9% FLUSH
10.0000 mL | INTRAVENOUS | Status: DC | PRN
  Administered 2019-07-08: 10 mL
  Filled 2019-07-08: qty 10

## 2019-07-08 MED ORDER — SODIUM CHLORIDE 0.9 % IV SOLN
Freq: Once | INTRAVENOUS | Status: AC
  Filled 2019-07-08: qty 250

## 2019-07-08 MED FILL — ONDANSETRON HCL 8 MG TABLET: 8 | 15 days supply | Qty: 30 | Fill #0

## 2019-07-08 MED FILL — LIDOCAINE-PRILOCAINE 2.5-2.: 2.5-2.5 | 15 days supply | Qty: 30 | Fill #0

## 2019-07-08 MED FILL — LORazepam 0.5 MG TABS: 0.5 | 9 days supply | Qty: 30 | Fill #0

## 2019-07-08 MED FILL — PROCHLORPERAZINE 10 MG TAB: 10 | 8 days supply | Qty: 30 | Fill #0

## 2019-07-08 NOTE — Progress Notes (Signed)
TSH ordered for cycle 2 on 07/29/19. Patient is currently on a steroid taper and is taking Dex 2mg  po BID. Ok to proceed with treatment today per Dr. Marin Olp.   Hardie Pulley, PharmD, BCPS, BCOP

## 2019-07-08 NOTE — Patient Instructions (Signed)
[facility] Discharge Instructions for Patients Receiving Chemotherapy  Today you received the following chemotherapy agents Tecentriq, Carboplatin, and Etoposide  To help prevent nausea and vomiting after your treatment, we encourage you to take your nausea medication as prescribed by MD. **DO NOT TAKE ZOFRAN FOR 3 DAYS AFTER RECEIVING CHEMOTHERAPY**   If you develop nausea and vomiting that is not controlled by your nausea medication, call the clinic.   BELOW ARE SYMPTOMS THAT SHOULD BE REPORTED IMMEDIATELY:  *FEVER GREATER THAN 100.5 F  *CHILLS WITH OR WITHOUT FEVER  NAUSEA AND VOMITING THAT IS NOT CONTROLLED WITH YOUR NAUSEA MEDICATION  *UNUSUAL SHORTNESS OF BREATH  *UNUSUAL BRUISING OR BLEEDING  TENDERNESS IN MOUTH AND THROAT WITH OR WITHOUT PRESENCE OF ULCERS  *URINARY PROBLEMS  *BOWEL PROBLEMS  UNUSUAL RASH Items with * indicate a potential emergency and should be followed up as soon as possible.  Feel free to call the clinic should you have any questions or concerns. The clinic phone number is [phone].  Please show the [facility] at check-in to the Emergency Department and triage nurse.

## 2019-07-08 NOTE — Progress Notes (Signed)
Patient is here to start his systemic treatment. He feels well and states he is "excited" to get started. He feels like he's feeling better after his radiation treatment last week. He has no complaints today.

## 2019-07-09 ENCOUNTER — Inpatient Hospital Stay: Payer: Medicare Other

## 2019-07-09 VITALS — BP 116/69 | HR 81 | Temp 98.7°F | Resp 20

## 2019-07-09 DIAGNOSIS — C7931 Secondary malignant neoplasm of brain: Secondary | ICD-10-CM | POA: Diagnosis not present

## 2019-07-09 DIAGNOSIS — C4372 Malignant melanoma of left lower limb, including hip: Secondary | ICD-10-CM | POA: Diagnosis not present

## 2019-07-09 DIAGNOSIS — C787 Secondary malignant neoplasm of liver and intrahepatic bile duct: Secondary | ICD-10-CM | POA: Diagnosis not present

## 2019-07-09 DIAGNOSIS — C3431 Malignant neoplasm of lower lobe, right bronchus or lung: Secondary | ICD-10-CM | POA: Diagnosis not present

## 2019-07-09 DIAGNOSIS — C3490 Malignant neoplasm of unspecified part of unspecified bronchus or lung: Secondary | ICD-10-CM

## 2019-07-09 DIAGNOSIS — Z5111 Encounter for antineoplastic chemotherapy: Secondary | ICD-10-CM | POA: Diagnosis not present

## 2019-07-09 DIAGNOSIS — Z5189 Encounter for other specified aftercare: Secondary | ICD-10-CM | POA: Diagnosis not present

## 2019-07-09 MED ORDER — SODIUM CHLORIDE 0.9 % IV SOLN
100.0000 mg/m2 | Freq: Once | INTRAVENOUS | Status: AC
  Administered 2019-07-09: 180 mg via INTRAVENOUS
  Filled 2019-07-09: qty 9

## 2019-07-09 MED ORDER — SODIUM CHLORIDE 0.9 % IV SOLN
Freq: Once | INTRAVENOUS | Status: AC
  Filled 2019-07-09: qty 250

## 2019-07-09 MED ORDER — DEXAMETHASONE SODIUM PHOSPHATE 10 MG/ML IJ SOLN
INTRAMUSCULAR | Status: AC
  Filled 2019-07-09: qty 1

## 2019-07-09 MED ORDER — DEXAMETHASONE SODIUM PHOSPHATE 10 MG/ML IJ SOLN
10.0000 mg | Freq: Once | INTRAMUSCULAR | Status: AC
  Administered 2019-07-09: 10 mg via INTRAVENOUS

## 2019-07-09 MED ORDER — SODIUM CHLORIDE 0.9% FLUSH
10.0000 mL | INTRAVENOUS | Status: DC | PRN
  Administered 2019-07-09: 10 mL
  Filled 2019-07-09: qty 10

## 2019-07-09 MED ORDER — HEPARIN SOD (PORK) LOCK FLUSH 100 UNIT/ML IV SOLN
500.0000 [IU] | Freq: Once | INTRAVENOUS | Status: AC | PRN
  Administered 2019-07-09: 500 [IU]
  Filled 2019-07-09: qty 5

## 2019-07-10 ENCOUNTER — Other Ambulatory Visit: Payer: Self-pay

## 2019-07-10 ENCOUNTER — Inpatient Hospital Stay: Payer: Medicare Other

## 2019-07-10 VITALS — BP 109/68 | HR 70 | Temp 98.6°F | Resp 17

## 2019-07-10 DIAGNOSIS — Z5111 Encounter for antineoplastic chemotherapy: Secondary | ICD-10-CM | POA: Diagnosis not present

## 2019-07-10 DIAGNOSIS — C787 Secondary malignant neoplasm of liver and intrahepatic bile duct: Secondary | ICD-10-CM | POA: Diagnosis not present

## 2019-07-10 DIAGNOSIS — Z5189 Encounter for other specified aftercare: Secondary | ICD-10-CM | POA: Diagnosis not present

## 2019-07-10 DIAGNOSIS — C7931 Secondary malignant neoplasm of brain: Secondary | ICD-10-CM | POA: Diagnosis not present

## 2019-07-10 DIAGNOSIS — C3490 Malignant neoplasm of unspecified part of unspecified bronchus or lung: Secondary | ICD-10-CM

## 2019-07-10 DIAGNOSIS — C4372 Malignant melanoma of left lower limb, including hip: Secondary | ICD-10-CM | POA: Diagnosis not present

## 2019-07-10 DIAGNOSIS — C3431 Malignant neoplasm of lower lobe, right bronchus or lung: Secondary | ICD-10-CM | POA: Diagnosis not present

## 2019-07-10 MED ORDER — HEPARIN SOD (PORK) LOCK FLUSH 100 UNIT/ML IV SOLN
500.0000 [IU] | Freq: Once | INTRAVENOUS | Status: AC | PRN
  Administered 2019-07-10: 500 [IU]
  Filled 2019-07-10: qty 5

## 2019-07-10 MED ORDER — SODIUM CHLORIDE 0.9% FLUSH
10.0000 mL | INTRAVENOUS | Status: DC | PRN
  Administered 2019-07-10: 10 mL
  Filled 2019-07-10: qty 10

## 2019-07-10 MED ORDER — SODIUM CHLORIDE 0.9 % IV SOLN
Freq: Once | INTRAVENOUS | Status: AC
  Filled 2019-07-10: qty 250

## 2019-07-10 MED ORDER — DEXAMETHASONE SODIUM PHOSPHATE 10 MG/ML IJ SOLN
10.0000 mg | Freq: Once | INTRAMUSCULAR | Status: AC
  Administered 2019-07-10: 10 mg via INTRAVENOUS

## 2019-07-10 MED ORDER — SODIUM CHLORIDE 0.9 % IV SOLN
100.0000 mg/m2 | Freq: Once | INTRAVENOUS | Status: AC
  Administered 2019-07-10: 180 mg via INTRAVENOUS
  Filled 2019-07-10: qty 9

## 2019-07-10 MED ORDER — DEXAMETHASONE SODIUM PHOSPHATE 10 MG/ML IJ SOLN
INTRAMUSCULAR | Status: AC
  Filled 2019-07-10: qty 1

## 2019-07-10 NOTE — Patient Instructions (Signed)
[facility] Discharge Instructions for Patients Receiving Chemotherapy  Today you received the following chemotherapy agents T\ Etoposide  To help prevent nausea and vomiting after your treatment, we encourage you to take your nausea medication as prescribed by MD. **DO NOT TAKE ZOFRAN FOR 3 DAYS AFTER RECEIVING CHEMOTHERAPY**   If you develop nausea and vomiting that is not controlled by your nausea medication, call the clinic.   BELOW ARE SYMPTOMS THAT SHOULD BE REPORTED IMMEDIATELY:  *FEVER GREATER THAN 100.5 F  *CHILLS WITH OR WITHOUT FEVER  NAUSEA AND VOMITING THAT IS NOT CONTROLLED WITH YOUR NAUSEA MEDICATION  *UNUSUAL SHORTNESS OF BREATH  *UNUSUAL BRUISING OR BLEEDING  TENDERNESS IN MOUTH AND THROAT WITH OR WITHOUT PRESENCE OF ULCERS  *URINARY PROBLEMS  *BOWEL PROBLEMS  UNUSUAL RASH Items with * indicate a potential emergency and should be followed up as soon as possible.  Feel free to call the clinic should you have any questions or concerns. The clinic phone number is [phone].  Please show the [facility] at check-in to the Emergency Department and triage nurse.

## 2019-07-12 ENCOUNTER — Other Ambulatory Visit: Payer: Self-pay

## 2019-07-12 ENCOUNTER — Other Ambulatory Visit: Payer: Self-pay | Admitting: Hematology & Oncology

## 2019-07-12 ENCOUNTER — Inpatient Hospital Stay: Payer: Medicare Other

## 2019-07-12 ENCOUNTER — Other Ambulatory Visit: Payer: Self-pay | Admitting: Radiation Therapy

## 2019-07-12 VITALS — BP 130/73 | HR 100 | Temp 99.1°F | Resp 18

## 2019-07-12 DIAGNOSIS — C3490 Malignant neoplasm of unspecified part of unspecified bronchus or lung: Secondary | ICD-10-CM

## 2019-07-12 DIAGNOSIS — C4372 Malignant melanoma of left lower limb, including hip: Secondary | ICD-10-CM | POA: Diagnosis not present

## 2019-07-12 DIAGNOSIS — C3431 Malignant neoplasm of lower lobe, right bronchus or lung: Secondary | ICD-10-CM | POA: Diagnosis not present

## 2019-07-12 DIAGNOSIS — Z5111 Encounter for antineoplastic chemotherapy: Secondary | ICD-10-CM | POA: Diagnosis not present

## 2019-07-12 DIAGNOSIS — Z5189 Encounter for other specified aftercare: Secondary | ICD-10-CM | POA: Diagnosis not present

## 2019-07-12 DIAGNOSIS — C7931 Secondary malignant neoplasm of brain: Secondary | ICD-10-CM | POA: Diagnosis not present

## 2019-07-12 DIAGNOSIS — C787 Secondary malignant neoplasm of liver and intrahepatic bile duct: Secondary | ICD-10-CM | POA: Diagnosis not present

## 2019-07-12 MED ORDER — FAMCICLOVIR 500 MG PO TABS: 500.0000 mg | ORAL_TABLET | Freq: Three times a day (TID) | ORAL | 6 refills | Status: AC

## 2019-07-12 MED ORDER — PEGFILGRASTIM-JMDB 6 MG/0.6ML ~~LOC~~ SOSY
PREFILLED_SYRINGE | SUBCUTANEOUS | Status: AC
  Filled 2019-07-12: qty 0.6

## 2019-07-12 MED ORDER — PEGFILGRASTIM-JMDB 6 MG/0.6ML ~~LOC~~ SOSY
6.0000 mg | PREFILLED_SYRINGE | Freq: Once | SUBCUTANEOUS | Status: AC
  Administered 2019-07-12: 6 mg via SUBCUTANEOUS

## 2019-07-12 MED FILL — FAMCICLOVIR 500 MG TABLET: 500 | 10 days supply | Qty: 30 | Fill #0

## 2019-07-12 NOTE — Patient Instructions (Addendum)
Pegfilgrastim injection What is this medicine? PEGFILGRASTIM (PEG fil gra stim) is a long-acting granulocyte colony-stimulating factor that stimulates the growth of neutrophils, a type of white blood cell important in the body's fight against infection. It is used to reduce the incidence of fever and infection in patients with certain types of cancer who are receiving chemotherapy that affects the bone marrow, and to increase survival after being exposed to high doses of radiation. This medicine may be used for other purposes; ask your health care provider or pharmacist if you have questions. COMMON BRAND NAME(S): Steve Rattler, Ziextenzo What should I tell my health care provider before I take this medicine? They need to know if you have any of these conditions:  kidney disease  latex allergy  ongoing radiation therapy  sickle cell disease  skin reactions to acrylic adhesives (On-Body Injector only)  an unusual or allergic reaction to pegfilgrastim, filgrastim, other medicines, foods, dyes, or preservatives  pregnant or trying to get pregnant  breast-feeding How should I use this medicine? This medicine is for injection under the skin. If you get this medicine at home, you will be taught how to prepare and give the pre-filled syringe or how to use the On-body Injector. Refer to the patient Instructions for Use for detailed instructions. Use exactly as directed. Tell your healthcare provider immediately if you suspect that the On-body Injector may not have performed as intended or if you suspect the use of the On-body Injector resulted in a missed or partial dose. It is important that you put your used needles and syringes in a special sharps container. Do not put them in a trash can. If you do not have a sharps container, call your pharmacist or healthcare provider to get one. Talk to your pediatrician regarding the use of this medicine in children. While this drug may be  prescribed for selected conditions, precautions do apply. Overdosage: If you think you have taken too much of this medicine contact a poison control center or emergency room at once. NOTE: This medicine is only for you. Do not share this medicine with others. What if I miss a dose? It is important not to miss your dose. Call your doctor or health care professional if you miss your dose. If you miss a dose due to an On-body Injector failure or leakage, a new dose should be administered as soon as possible using a single prefilled syringe for manual use. What may interact with this medicine? Interactions have not been studied. Give your health care provider a list of all the medicines, herbs, non-prescription drugs, or dietary supplements you use. Also tell them if you smoke, drink alcohol, or use illegal drugs. Some items may interact with your medicine. This list may not describe all possible interactions. Give your health care provider a list of all the medicines, herbs, non-prescription drugs, or dietary supplements you use. Also tell them if you smoke, drink alcohol, or use illegal drugs. Some items may interact with your medicine. What should I watch for while using this medicine? You may need blood work done while you are taking this medicine. If you are going to need a MRI, CT scan, or other procedure, tell your doctor that you are using this medicine (On-Body Injector only). What side effects may I notice from receiving this medicine? Side effects that you should report to your doctor or health care professional as soon as possible:  allergic reactions like skin rash, itching or hives, swelling of the  face, lips, or tongue  back pain  dizziness  fever  pain, redness, or irritation at site where injected  pinpoint red spots on the skin  red or dark-brown urine  shortness of breath or breathing problems  stomach or side pain, or pain at the  shoulder  swelling  tiredness  trouble passing urine or change in the amount of urine Side effects that usually do not require medical attention (report to your doctor or health care professional if they continue or are bothersome):  bone pain  muscle pain This list may not describe all possible side effects. Call your doctor for medical advice about side effects. You may report side effects to FDA at 1-800-FDA-1088. Where should I keep my medicine? Keep out of the reach of children. If you are using this medicine at home, you will be instructed on how to store it. Throw away any unused medicine after the expiration date on the label. NOTE: This sheet is a summary. It may not cover all possible information. If you have questions about this medicine, talk to your doctor, pharmacist, or health care provider.  2020 Elsevier/Gold Standard (2017-08-28 16:57:08)  Shingles  Shingles is an infection. It gives you a painful skin rash and blisters that have fluid in them. Shingles is caused by the same germ (virus) that causes chickenpox. Shingles only happens in people who:  Have had chickenpox.  Have been given a shot of medicine (vaccine) to protect against chickenpox. Shingles is rare in this group. The first symptoms of shingles may be itching, tingling, or pain in an area on your skin. A rash will show on your skin a few days or weeks later. The rash is likely to be on one side of your body. The rash usually has a shape like a belt or a band. Over time, the rash turns into fluid-filled blisters. The blisters will break open, change into scabs, and dry up. Medicines may:  Help with pain and itching.  Help you get better sooner.  Help to prevent long-term problems. Follow these instructions at home: Medicines  Take over-the-counter and prescription medicines only as told by your doctor.  Put on an anti-itch cream or numbing cream where you have a rash, blisters, or scabs. Do this as  told by your doctor. Helping with itching and discomfort   Put cold, wet cloths (cold compresses) on the area of the rash or blisters as told by your doctor.  Cool baths can help you feel better. Try adding baking soda or dry oatmeal to the water to lessen itching. Do not bathe in hot water. Blister and rash care  Keep your rash covered with a loose bandage (dressing).  Wear loose clothing that does not rub on your rash.  Keep your rash and blisters clean. To do this, wash the area with mild soap and cool water as told by your doctor.  Check your rash every day for signs of infection. Check for: ? More redness, swelling, or pain. ? Fluid or blood. ? Warmth. ? Pus or a bad smell.  Do not scratch your rash. Do not pick at your blisters. To help you to not scratch: ? Keep your fingernails clean and cut short. ? Wear gloves or mittens when you sleep, if scratching is a problem. General instructions  Rest as told by your doctor.  Keep all follow-up visits as told by your doctor. This is important.  Wash your hands often with soap and water. If soap and  water are not available, use hand sanitizer. Doing this lowers your chance of getting a skin infection caused by germs (bacteria).  Your infection can cause chickenpox in people who have never had chickenpox or never got a shot of chickenpox vaccine. If you have blisters that did not change into scabs yet, try not to touch other people or be around other people, especially: ? Babies. ? Pregnant women. ? Children who have areas of red, itchy, or rough skin (eczema). ? Very old people who have transplants. ? People who have a long-term (chronic) sickness, like cancer or AIDS. Contact a doctor if:  Your pain does not get better with medicine.  Your pain does not get better after the rash heals.  You have any signs of infection in the rash area. These signs include: ? More redness, swelling, or pain around the rash. ? Fluid or  blood coming from the rash. ? The rash area feeling warm to the touch. ? Pus or a bad smell coming from the rash. Get help right away if:  The rash is on your face or nose.  You have pain in your face or pain by your eye.  You lose feeling on one side of your face.  You have trouble seeing.  You have ear pain, or you have ringing in your ear.  You have a loss of taste.  Your condition gets worse. Summary  Shingles gives you a painful skin rash and blisters that have fluid in them.  Shingles is an infection. It is caused by the same germ (virus) that causes chickenpox.  Keep your rash covered with a loose bandage (dressing). Wear loose clothing that does not rub on your rash.  If you have blisters that did not change into scabs yet, try not to touch other people or be around people. This information is not intended to replace advice given to you by your health care provider. Make sure you discuss any questions you have with your health care provider. Document Revised: 09/14/2018 Document Reviewed: 01/25/2017 Elsevier Patient Education  2020 [employer].

## 2019-07-12 NOTE — Progress Notes (Signed)
Dr. Marin Olp in to assess right buttock. Shingles-Rx sent to [facility] per Dr. Marin Olp.

## 2019-07-18 ENCOUNTER — Other Ambulatory Visit: Payer: Self-pay | Admitting: Radiation Oncology

## 2019-07-22 ENCOUNTER — Other Ambulatory Visit: Payer: Medicare Other

## 2019-07-22 ENCOUNTER — Ambulatory Visit: Payer: Medicare Other

## 2019-07-24 ENCOUNTER — Ambulatory Visit: Payer: Medicare Other | Admitting: Hematology & Oncology

## 2019-07-24 ENCOUNTER — Telehealth: Payer: Self-pay | Admitting: Radiation Oncology

## 2019-07-24 ENCOUNTER — Other Ambulatory Visit: Payer: Medicare Other

## 2019-07-24 NOTE — Telephone Encounter (Signed)
Received DECADRON refill request from [facility]. Faxed refill denial back. Confirmation fax of delivery obtained. Per Raul Del, [facility] decadron taper has been initiated thus initial script is invalid.

## 2019-07-24 NOTE — Telephone Encounter (Signed)
Phoned patient's home. Spoke with [name]. Explained that per Shona Simpson, PA-C his medication is ready for pick up at [facility] [employer]. [name] verbalized understandings and committed to telling his wife so she could pick his medication up.

## 2019-07-26 ENCOUNTER — Other Ambulatory Visit: Payer: Self-pay | Admitting: *Deleted

## 2019-07-26 DIAGNOSIS — C7931 Secondary malignant neoplasm of brain: Secondary | ICD-10-CM

## 2019-07-26 DIAGNOSIS — C3490 Malignant neoplasm of unspecified part of unspecified bronchus or lung: Secondary | ICD-10-CM

## 2019-07-29 ENCOUNTER — Encounter: Payer: Self-pay | Admitting: Hematology & Oncology

## 2019-07-29 ENCOUNTER — Other Ambulatory Visit: Payer: Self-pay

## 2019-07-29 ENCOUNTER — Inpatient Hospital Stay: Payer: Medicare Other

## 2019-07-29 ENCOUNTER — Other Ambulatory Visit: Payer: Self-pay | Admitting: *Deleted

## 2019-07-29 ENCOUNTER — Inpatient Hospital Stay (HOSPITAL_BASED_OUTPATIENT_CLINIC_OR_DEPARTMENT_OTHER): Payer: Medicare Other | Admitting: Hematology & Oncology

## 2019-07-29 VITALS — BP 133/84 | HR 96 | Temp 97.8°F | Wt 161.4 lb

## 2019-07-29 DIAGNOSIS — C3490 Malignant neoplasm of unspecified part of unspecified bronchus or lung: Secondary | ICD-10-CM

## 2019-07-29 DIAGNOSIS — C3491 Malignant neoplasm of unspecified part of right bronchus or lung: Secondary | ICD-10-CM

## 2019-07-29 DIAGNOSIS — Z5111 Encounter for antineoplastic chemotherapy: Secondary | ICD-10-CM | POA: Diagnosis not present

## 2019-07-29 DIAGNOSIS — Z5189 Encounter for other specified aftercare: Secondary | ICD-10-CM | POA: Diagnosis not present

## 2019-07-29 DIAGNOSIS — C3431 Malignant neoplasm of lower lobe, right bronchus or lung: Secondary | ICD-10-CM | POA: Diagnosis not present

## 2019-07-29 DIAGNOSIS — C7931 Secondary malignant neoplasm of brain: Secondary | ICD-10-CM

## 2019-07-29 DIAGNOSIS — C4372 Malignant melanoma of left lower limb, including hip: Secondary | ICD-10-CM | POA: Diagnosis not present

## 2019-07-29 DIAGNOSIS — C787 Secondary malignant neoplasm of liver and intrahepatic bile duct: Secondary | ICD-10-CM | POA: Diagnosis not present

## 2019-07-29 LAB — CBC WITH DIFFERENTIAL (CANCER CENTER ONLY)
Abs Immature Granulocytes: 8.89 10*3/uL — ABNORMAL HIGH (ref 0.00–0.07)
Basophils Absolute: 0.1 10*3/uL (ref 0.0–0.1)
Basophils Relative: 0 %
Eosinophils Absolute: 0 10*3/uL (ref 0.0–0.5)
Eosinophils Relative: 0 %
HCT: 32.7 % — ABNORMAL LOW (ref 39.0–52.0)
Hemoglobin: 11.3 g/dL — ABNORMAL LOW (ref 13.0–17.0)
Immature Granulocytes: 23 %
Lymphocytes Relative: 8 %
Lymphs Abs: 3.1 10*3/uL (ref 0.7–4.0)
MCH: 33.2 pg (ref 26.0–34.0)
MCHC: 34.6 g/dL (ref 30.0–36.0)
MCV: 96.2 fL (ref 80.0–100.0)
Monocytes Absolute: 3.1 10*3/uL — ABNORMAL HIGH (ref 0.1–1.0)
Monocytes Relative: 8 %
Neutro Abs: 22.9 10*3/uL — ABNORMAL HIGH (ref 1.7–7.7)
Neutrophils Relative %: 61 %
Platelet Count: 359 10*3/uL (ref 150–400)
RBC: 3.4 MIL/uL — ABNORMAL LOW (ref 4.22–5.81)
RDW: 14.1 % (ref 11.5–15.5)
WBC Count: 38.1 10*3/uL — ABNORMAL HIGH (ref 4.0–10.5)
nRBC: 0.4 % — ABNORMAL HIGH (ref 0.0–0.2)

## 2019-07-29 LAB — CMP (CANCER CENTER ONLY)
ALT: 17 U/L (ref 0–44)
AST: 18 U/L (ref 15–41)
Albumin: 4.1 g/dL (ref 3.5–5.0)
Alkaline Phosphatase: 66 U/L (ref 38–126)
Anion gap: 8 (ref 5–15)
BUN: 14 mg/dL (ref 8–23)
CO2: 30 mmol/L (ref 22–32)
Calcium: 9.5 mg/dL (ref 8.9–10.3)
Chloride: 101 mmol/L (ref 98–111)
Creatinine: 0.68 mg/dL (ref 0.61–1.24)
GFR, Est AFR Am: >60 mL/min (ref >60)
GFR, Estimated: >60 mL/min (ref >60)
Glucose, Bld: 96 mg/dL (ref 70–99)
Potassium: 4.2 mmol/L (ref 3.5–5.1)
Sodium: 139 mmol/L (ref 135–145)
Total Bilirubin: 0.4 mg/dL (ref 0.3–1.2)
Total Protein: 6.6 g/dL (ref 6.5–8.1)

## 2019-07-29 MED ORDER — NICOTINE 21 MG/24HR TD PT24: 21.0000 mg | MEDICATED_PATCH | Freq: Every day | TRANSDERMAL | 0 refills | Status: DC

## 2019-07-29 MED ORDER — PALONOSETRON HCL INJECTION 0.25 MG/5ML
0.2500 mg | Freq: Once | INTRAVENOUS | Status: AC
  Administered 2019-07-29: 0.25 mg via INTRAVENOUS

## 2019-07-29 MED ORDER — PALONOSETRON HCL INJECTION 0.25 MG/5ML
INTRAVENOUS | Status: AC
  Filled 2019-07-29: qty 5

## 2019-07-29 MED ORDER — SODIUM CHLORIDE 0.9 % IV SOLN
1200.0000 mg | Freq: Once | INTRAVENOUS | Status: AC
  Administered 2019-07-29: 1200 mg via INTRAVENOUS
  Filled 2019-07-29: qty 20

## 2019-07-29 MED ORDER — SODIUM CHLORIDE 0.9% FLUSH
10.0000 mL | INTRAVENOUS | Status: DC | PRN
  Administered 2019-07-29: 10 mL
  Filled 2019-07-29: qty 10

## 2019-07-29 MED ORDER — SODIUM CHLORIDE 0.9 % IV SOLN
150.0000 mg | Freq: Once | INTRAVENOUS | Status: AC
  Administered 2019-07-29: 150 mg via INTRAVENOUS
  Filled 2019-07-29: qty 150

## 2019-07-29 MED ORDER — DEXAMETHASONE SODIUM PHOSPHATE 10 MG/ML IJ SOLN
INTRAMUSCULAR | Status: AC
  Filled 2019-07-29: qty 1

## 2019-07-29 MED ORDER — SODIUM CHLORIDE 0.9 % IV SOLN
100.0000 mg/m2 | Freq: Once | INTRAVENOUS | Status: AC
  Administered 2019-07-29: 180 mg via INTRAVENOUS
  Filled 2019-07-29: qty 9

## 2019-07-29 MED ORDER — DEXAMETHASONE SODIUM PHOSPHATE 10 MG/ML IJ SOLN
10.0000 mg | Freq: Once | INTRAMUSCULAR | Status: AC
  Administered 2019-07-29: 10 mg via INTRAVENOUS

## 2019-07-29 MED ORDER — SODIUM CHLORIDE 0.9 % IV SOLN
441.0000 mg | Freq: Once | INTRAVENOUS | Status: AC
  Administered 2019-07-29: 440 mg via INTRAVENOUS
  Filled 2019-07-29: qty 44

## 2019-07-29 MED ORDER — SODIUM CHLORIDE 0.9 % IV SOLN
Freq: Once | INTRAVENOUS | Status: AC
  Filled 2019-07-29: qty 250

## 2019-07-29 MED ORDER — HEPARIN SOD (PORK) LOCK FLUSH 100 UNIT/ML IV SOLN
500.0000 [IU] | Freq: Once | INTRAVENOUS | Status: AC | PRN
  Administered 2019-07-29: 500 [IU]
  Filled 2019-07-29: qty 5

## 2019-07-29 MED FILL — NICOTINE 21 MG/24HR PATCH: 21 | 28 days supply | Qty: 28 | Fill #0

## 2019-07-29 NOTE — Patient Instructions (Signed)
[facility] Discharge Instructions for Patients Receiving Chemotherapy  Today you received the following chemotherapy agents Tecentriq, Carboplatin, and Etoposide  To help prevent nausea and vomiting after your treatment, we encourage you to take your nausea medication as prescribed by MD. **DO NOT TAKE ZOFRAN FOR 3 DAYS AFTER RECEIVING CHEMOTHERAPY**   If you develop nausea and vomiting that is not controlled by your nausea medication, call the clinic.   BELOW ARE SYMPTOMS THAT SHOULD BE REPORTED IMMEDIATELY:  *FEVER GREATER THAN 100.5 F  *CHILLS WITH OR WITHOUT FEVER  NAUSEA AND VOMITING THAT IS NOT CONTROLLED WITH YOUR NAUSEA MEDICATION  *UNUSUAL SHORTNESS OF BREATH  *UNUSUAL BRUISING OR BLEEDING  TENDERNESS IN MOUTH AND THROAT WITH OR WITHOUT PRESENCE OF ULCERS  *URINARY PROBLEMS  *BOWEL PROBLEMS  UNUSUAL RASH Items with * indicate a potential emergency and should be followed up as soon as possible.  Feel free to call the clinic should you have any questions or concerns. The clinic phone number is [phone].  Please show the [facility] at check-in to the Emergency Department and triage nurse.

## 2019-07-29 NOTE — Progress Notes (Signed)
Hematology and Oncology Follow Up Visit  Walter Hernandez [id number] [birth year] 69 y.o. 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  He tolerated first cycle of chemotherapy pretty well.  He did not have a lot of side effects from it.  His white cell count is on the high side which I suspect is probably from the G-CSF that we gave him.  I looked at his blood smear under the microscope.  I do not see anything that looked suspicious for blasts or for a underlying myeloproliferative issue.  He is only taking 2 mg of Decadron a day.  I told him to go ahead and stop taking this.  He has had no fever.  He has had no bleeding.  There is no change in bowel or bladder habits.  He has had no problems with nausea or vomiting.  He does feel a little tired.  Overall, I would say his performance status is ECOG 1.    Medications:  Allergies as of 07/29/2019   No Known Allergies     Medication List       Accurate as of July 29, 2019 11:26 AM. If you have any questions, ask your nurse or doctor.        acetaminophen 500 MG tablet Commonly known as: TYLENOL Take 1,000 mg by mouth every 6 (six) hours as needed for moderate pain.   ALPRAZolam 0.5 MG tablet Commonly known as: XANAX Take 1 tablet (0.5 mg total) by mouth every 6 (six) hours as needed for anxiety.   amLODipine 5 MG tablet Commonly known as: NORVASC Take 5 mg by mouth daily.   dexamethasone 4 MG tablet Commonly known as: DECADRON Take 1 tablet (4 mg total) by mouth 2 (two) times daily.   doxycycline 100 MG tablet Commonly known as: VIBRA-TABS Take 1 tablet (100 mg total) by mouth 2 (two) times daily.     famciclovir 500 MG tablet Commonly known as: FAMVIR Take 500 mg by mouth every morning.   finasteride 5 MG tablet Commonly known as: Proscar Take 1 tablet (5 mg total) by mouth daily.   IBUPROFEN & ACETAMINOPHEN PO Take 1-2 tablets by mouth every 6 (six) hours as needed (pain).   lidocaine-prilocaine cream Commonly known as: EMLA Apply to affected area once   LORazepam 0.5 MG tablet Commonly known as: ATIVAN 1 tablet po 30 minutes prior to radiation or MRI   LORazepam 0.5 MG tablet Commonly known as: Ativan Take 1 tablet (0.5 mg total) by mouth every 6 (six) hours as needed (Nausea or vomiting).   memantine 5 MG tablet Commonly known as: Namenda Starting 06/18/19 Take one tab po daily. Starting 06/25/19 Take one tab po q am and one tab po q pm. Starting 1/26/21Take two tabs po q am, and one tab po q pm. Starting 07/09/19 Take two tabs po q am and two tabs po q pm until 12/03/19.   multivitamin capsule Take 1 capsule by mouth daily.   nicotine 21 mg/24hr patch Commonly known as: NICODERM CQ - dosed in mg/24 hours Place 1 patch (21 mg total) onto the skin daily.   nystatin 100000 UNIT/ML suspension Commonly known as: MYCOSTATIN Take 5 mLs (  500,000 Units total) by mouth 4 (four) times daily.   omeprazole 40 MG capsule Commonly known as: PRILOSEC Take 40 mg by mouth daily.   ondansetron 4 MG tablet Commonly known as: ZOFRAN Take 1 tablet (4 mg total) by mouth every 6 (six) hours as needed for nausea.   ondansetron 8 MG tablet Commonly known as: Zofran Take 1 tablet (8 mg total) by mouth 2 (two) times daily as needed for refractory nausea / vomiting. Start on day 3 after carboplatin chemo.   OPCON-A OP Place 2 drops into both eyes as needed (for dry eyes).   prochlorperazine 10 MG tablet Commonly known as: COMPAZINE Take 1 tablet (10 mg total) by mouth every 6 (six) hours as needed (Nausea or vomiting).   sucralfate 1 g tablet Commonly known as: CARAFATE TAKE 1  TABLET (1 G TOTAL) BY MOUTH 4 (FOUR) TIMES DAILY. DISSOLVE EACH TABLET IN 15 CC WATER.   tamsulosin 0.4 MG Caps capsule Commonly known as: FLOMAX Take 2 capsules (0.8 mg total) by mouth daily. What changed:   how much to take  when to take this       Allergies: No Known Allergies  Past Medical History, Surgical history, Social history, and Family History were reviewed and updated.  Review of Systems: Review of Systems  Constitutional: Negative.   HENT: Negative.   Eyes: Negative.   Respiratory: Negative.   Cardiovascular: Negative.   Gastrointestinal: Negative.   Genitourinary: Negative.   Musculoskeletal: Negative.   Skin: Negative.   Neurological: Negative.   Endo/Heme/Allergies: Negative.   Psychiatric/Behavioral: Negative.      Physical Exam:  weight is 161 lb 6.4 oz (73.2 kg). His oral temperature is 97.8 F (36.6 C). His blood pressure is 133/84 and his pulse is 96. His oxygen saturation is 100%.   Wt Readings from Last 3 Encounters:  07/29/19 161 lb 6.4 oz (73.2 kg)  06/20/19 144 lb 12.8 oz (65.7 kg)  06/17/19 143 lb 3.2 oz (65 kg)   Physical Exam Vitals reviewed.  HENT:     Head: Normocephalic and atraumatic.  Eyes:     Pupils: Pupils are equal, round, and reactive to light.  Cardiovascular:     Rate and Rhythm: Normal rate and regular rhythm.     Heart sounds: Normal heart sounds.  Pulmonary:     Effort: Pulmonary effort is normal.     Breath sounds: Normal breath sounds.  Abdominal:     General: Bowel sounds are normal.     Palpations: Abdomen is soft.  Musculoskeletal:        General: No tenderness or deformity. Normal range of motion.     Cervical back: Normal range of motion.     Comments: There is weakness of his left arm.  He has some limited range of motion of the left arm.  Right arm is with good range of motion and strength.  There is no swelling in his legs. .  Lymphadenopathy:     Cervical: No cervical adenopathy.  Skin:     General: Skin is warm and dry.     Findings: No erythema or rash.     Comments: He does have the healed wide local excision in the left internal lower leg. This is well-healed.  Neurological:     Mental Status: He is alert and oriented to person, place, and time.  Psychiatric:        Behavior: Behavior normal.        Thought Content: Thought  content normal.        Judgment: Judgment normal.       Lab Results  Component Value Date   WBC 38.1 (H) 07/29/2019   HGB 11.3 (L) 07/29/2019   HCT 32.7 (L) 07/29/2019   MCV 96.2 07/29/2019   PLT 359 07/29/2019   No results found for: FERRITIN, IRON, TIBC, UIBC, IRONPCTSAT Lab Results  Component Value Date   RBC 3.40 (L) 07/29/2019   No results found for: KPAFRELGTCHN, LAMBDASER, KAPLAMBRATIO No results found for: IGGSERUM, IGA, IGMSERUM No results found for: Odetta Pink, SPEI   Chemistry      Component Value Date/Time   NA 139 07/29/2019 1026   NA 142 01/30/2017 0804   K 4.2 07/29/2019 1026   K 4.1 01/30/2017 0804   CL 101 07/29/2019 1026   CL 105 01/25/2016 0926   CO2 30 07/29/2019 1026   CO2 29 01/30/2017 0804   BUN 14 07/29/2019 1026   BUN 6.9 (L) 01/30/2017 0804   CREATININE 0.68 07/29/2019 1026   CREATININE 0.8 01/30/2017 0804      Component Value Date/Time   CALCIUM 9.5 07/29/2019 1026   CALCIUM 10.5 (H) 01/30/2017 0804   ALKPHOS 66 07/29/2019 1026   ALKPHOS 79 01/30/2017 0804   AST 18 07/29/2019 1026   AST 34 01/30/2017 0804   ALT 17 07/29/2019 1026   ALT 18 01/30/2017 0804   BILITOT 0.4 07/29/2019 1026   BILITOT 1.49 (H) 01/30/2017 0804     Impression and Plan: Walter Hernandez is a 69 year old male with a history of stage Ib melanoma of the left lower leg.  This really is not a problem for him.  The problem now is that he has extensive stage small cell lung cancer.  Hopefully, he will be responding.  After this cycle of treatment, we will do a PET scan on him  and see how he has responded.  I am just happy that he seems to be doing fairly well right now.  Volanda Napoleon, MD 2/22/202111:26 AM

## 2019-07-30 ENCOUNTER — Other Ambulatory Visit: Payer: Self-pay | Admitting: Radiation Oncology

## 2019-07-30 ENCOUNTER — Encounter: Payer: Self-pay | Admitting: *Deleted

## 2019-07-30 ENCOUNTER — Other Ambulatory Visit: Payer: Self-pay | Admitting: Hematology & Oncology

## 2019-07-30 ENCOUNTER — Inpatient Hospital Stay: Payer: Medicare Other

## 2019-07-30 VITALS — BP 134/70 | HR 73 | Temp 97.5°F | Resp 18

## 2019-07-30 DIAGNOSIS — C3431 Malignant neoplasm of lower lobe, right bronchus or lung: Secondary | ICD-10-CM | POA: Diagnosis not present

## 2019-07-30 DIAGNOSIS — C4372 Malignant melanoma of left lower limb, including hip: Secondary | ICD-10-CM | POA: Diagnosis not present

## 2019-07-30 DIAGNOSIS — Z5189 Encounter for other specified aftercare: Secondary | ICD-10-CM | POA: Diagnosis not present

## 2019-07-30 DIAGNOSIS — C787 Secondary malignant neoplasm of liver and intrahepatic bile duct: Secondary | ICD-10-CM | POA: Diagnosis not present

## 2019-07-30 DIAGNOSIS — C7931 Secondary malignant neoplasm of brain: Secondary | ICD-10-CM | POA: Diagnosis not present

## 2019-07-30 DIAGNOSIS — C3490 Malignant neoplasm of unspecified part of unspecified bronchus or lung: Secondary | ICD-10-CM

## 2019-07-30 DIAGNOSIS — Z5111 Encounter for antineoplastic chemotherapy: Secondary | ICD-10-CM | POA: Diagnosis not present

## 2019-07-30 DIAGNOSIS — C3491 Malignant neoplasm of unspecified part of right bronchus or lung: Secondary | ICD-10-CM

## 2019-07-30 LAB — TSH: TSH: 2.533 u[IU]/mL (ref 0.320–4.118)

## 2019-07-30 MED ORDER — DEXAMETHASONE SODIUM PHOSPHATE 10 MG/ML IJ SOLN
10.0000 mg | Freq: Once | INTRAMUSCULAR | Status: AC
  Administered 2019-07-30: 10 mg via INTRAVENOUS

## 2019-07-30 MED ORDER — SODIUM CHLORIDE 0.9 % IV SOLN
Freq: Once | INTRAVENOUS | Status: AC
  Filled 2019-07-30: qty 250

## 2019-07-30 MED ORDER — HEPARIN SOD (PORK) LOCK FLUSH 100 UNIT/ML IV SOLN
500.0000 [IU] | Freq: Once | INTRAVENOUS | Status: AC | PRN
  Administered 2019-07-30: 500 [IU]
  Filled 2019-07-30: qty 5

## 2019-07-30 MED ORDER — SODIUM CHLORIDE 0.9 % IV SOLN
100.0000 mg/m2 | Freq: Once | INTRAVENOUS | Status: AC
  Administered 2019-07-30: 180 mg via INTRAVENOUS
  Filled 2019-07-30: qty 9

## 2019-07-30 MED ORDER — DEXAMETHASONE SODIUM PHOSPHATE 10 MG/ML IJ SOLN
INTRAMUSCULAR | Status: AC
  Filled 2019-07-30: qty 1

## 2019-07-30 MED ORDER — SODIUM CHLORIDE 0.9% FLUSH
10.0000 mL | INTRAVENOUS | Status: DC | PRN
  Administered 2019-07-30: 10 mL
  Filled 2019-07-30: qty 10

## 2019-07-30 NOTE — Patient Instructions (Signed)
Etoposide, VP-16 injection What is this medicine? ETOPOSIDE, VP-16 (e toe POE side) is a chemotherapy drug. It is used to treat testicular cancer, lung cancer, and other cancers. This medicine may be used for other purposes; ask your health care provider or pharmacist if you have questions. COMMON BRAND NAME(S): Etopophos, Toposar, VePesid What should I tell my health care provider before I take this medicine? They need to know if you have any of these conditions:  infection  kidney disease  liver disease  low blood counts, like low white cell, platelet, or red cell counts  an unusual or allergic reaction to etoposide, other medicines, foods, dyes, or preservatives  pregnant or trying to get pregnant  breast-feeding How should I use this medicine? This medicine is for infusion into a vein. It is administered in a hospital or clinic by a specially trained health care professional. Talk to your pediatrician regarding the use of this medicine in children. Special care may be needed. Overdosage: If you think you have taken too much of this medicine contact a poison control center or emergency room at once. NOTE: This medicine is only for you. Do not share this medicine with others. What if I miss a dose? It is important not to miss your dose. Call your doctor or health care professional if you are unable to keep an appointment. What may interact with this medicine? This medicine may interact with the following medications:  warfarin This list may not describe all possible interactions. Give your health care provider a list of all the medicines, herbs, non-prescription drugs, or dietary supplements you use. Also tell them if you smoke, drink alcohol, or use illegal drugs. Some items may interact with your medicine. What should I watch for while using this medicine? Visit your doctor for checks on your progress. This drug may make you feel generally unwell. This is not uncommon, as  chemotherapy can affect healthy cells as well as cancer cells. Report any side effects. Continue your course of treatment even though you feel ill unless your doctor tells you to stop. In some cases, you may be given additional medicines to help with side effects. Follow all directions for their use. Call your doctor or health care professional for advice if you get a fever, chills or sore throat, or other symptoms of a cold or flu. Do not treat yourself. This drug decreases your body's ability to fight infections. Try to avoid being around people who are sick. This medicine may increase your risk to bruise or bleed. Call your doctor or health care professional if you notice any unusual bleeding. Talk to your doctor about your risk of cancer. You may be more at risk for certain types of cancers if you take this medicine. Do not become pregnant while taking this medicine or for at least 6 months after stopping it. Women should inform their doctor if they wish to become pregnant or think they might be pregnant. Women of child-bearing potential will need to have a negative pregnancy test before starting this medicine. There is a potential for serious side effects to an unborn child. Talk to your health care professional or pharmacist for more information. Do not breast-feed an infant while taking this medicine. Men must use a latex condom during sexual contact with a woman while taking this medicine and for at least 4 months after stopping it. A latex condom is needed even if you have had a vasectomy. Contact your doctor right away if your partner  becomes pregnant. Do not donate sperm while taking this medicine and for at least 4 months after you stop taking this medicine. Men should inform their doctors if they wish to father a child. This medicine may lower sperm counts. What side effects may I notice from receiving this medicine? Side effects that you should report to your doctor or health care professional  as soon as possible:  allergic reactions like skin rash, itching or hives, swelling of the face, lips, or tongue  low blood counts - this medicine may decrease the number of white blood cells, red blood cells, and platelets. You may be at increased risk for infections and bleeding  nausea, vomiting  redness, blistering, peeling or loosening of the skin, including inside the mouth  signs and symptoms of infection like fever; chills; cough; sore throat; pain or trouble passing urine  signs and symptoms of low red blood cells or anemia such as unusually weak or tired; feeling faint or lightheaded; falls; breathing problems  unusual bruising or bleeding Side effects that usually do not require medical attention (report to your doctor or health care professional if they continue or are bothersome):  changes in taste  diarrhea  hair loss  loss of appetite  mouth sores This list may not describe all possible side effects. Call your doctor for medical advice about side effects. You may report side effects to FDA at 1-800-FDA-1088. Where should I keep my medicine? This drug is given in a hospital or clinic and will not be stored at home. NOTE: This sheet is a summary. It may not cover all possible information. If you have questions about this medicine, talk to your doctor, pharmacist, or health care provider.  2020 Elsevier/Gold Standard (2018-07-18 16:57:15)

## 2019-07-30 NOTE — Progress Notes (Signed)
Patient needs a PET after his second cycle. PET scan scheduled. PET scan appointment information sheet given to patient with time, date, location of PET and pre PET instructions. I also called and spoke to Herbst and reviewed all above info with her as well.

## 2019-07-31 ENCOUNTER — Other Ambulatory Visit: Payer: Self-pay

## 2019-07-31 ENCOUNTER — Inpatient Hospital Stay: Payer: Medicare Other

## 2019-07-31 VITALS — BP 137/79 | HR 90 | Temp 97.3°F | Resp 18

## 2019-07-31 DIAGNOSIS — C3431 Malignant neoplasm of lower lobe, right bronchus or lung: Secondary | ICD-10-CM | POA: Diagnosis not present

## 2019-07-31 DIAGNOSIS — Z5111 Encounter for antineoplastic chemotherapy: Secondary | ICD-10-CM | POA: Diagnosis not present

## 2019-07-31 DIAGNOSIS — Z5189 Encounter for other specified aftercare: Secondary | ICD-10-CM | POA: Diagnosis not present

## 2019-07-31 DIAGNOSIS — C787 Secondary malignant neoplasm of liver and intrahepatic bile duct: Secondary | ICD-10-CM | POA: Diagnosis not present

## 2019-07-31 DIAGNOSIS — C7931 Secondary malignant neoplasm of brain: Secondary | ICD-10-CM | POA: Diagnosis not present

## 2019-07-31 DIAGNOSIS — C4372 Malignant melanoma of left lower limb, including hip: Secondary | ICD-10-CM | POA: Diagnosis not present

## 2019-07-31 DIAGNOSIS — C3490 Malignant neoplasm of unspecified part of unspecified bronchus or lung: Secondary | ICD-10-CM

## 2019-07-31 MED ORDER — SODIUM CHLORIDE 0.9% FLUSH
10.0000 mL | INTRAVENOUS | Status: DC | PRN
  Administered 2019-07-31: 10 mL
  Filled 2019-07-31: qty 10

## 2019-07-31 MED ORDER — SODIUM CHLORIDE 0.9 % IV SOLN
Freq: Once | INTRAVENOUS | Status: AC
  Filled 2019-07-31: qty 250

## 2019-07-31 MED ORDER — HEPARIN SOD (PORK) LOCK FLUSH 100 UNIT/ML IV SOLN
500.0000 [IU] | Freq: Once | INTRAVENOUS | Status: AC | PRN
  Administered 2019-07-31: 500 [IU]
  Filled 2019-07-31: qty 5

## 2019-07-31 MED ORDER — DEXAMETHASONE SODIUM PHOSPHATE 10 MG/ML IJ SOLN
INTRAMUSCULAR | Status: AC
  Filled 2019-07-31: qty 1

## 2019-07-31 MED ORDER — DEXAMETHASONE SODIUM PHOSPHATE 10 MG/ML IJ SOLN
10.0000 mg | Freq: Once | INTRAMUSCULAR | Status: AC
  Administered 2019-07-31: 10 mg via INTRAVENOUS

## 2019-07-31 MED ORDER — SODIUM CHLORIDE 0.9 % IV SOLN
100.0000 mg/m2 | Freq: Once | INTRAVENOUS | Status: AC
  Administered 2019-07-31: 180 mg via INTRAVENOUS
  Filled 2019-07-31: qty 9

## 2019-07-31 NOTE — Patient Instructions (Signed)
Etoposide, VP-16 injection What is this medicine? ETOPOSIDE, VP-16 (e toe POE side) is a chemotherapy drug. It is used to treat testicular cancer, lung cancer, and other cancers. This medicine may be used for other purposes; ask your health care provider or pharmacist if you have questions. COMMON BRAND NAME(S): Etopophos, Toposar, VePesid What should I tell my health care provider before I take this medicine? They need to know if you have any of these conditions:  infection  kidney disease  liver disease  low blood counts, like low white cell, platelet, or red cell counts  an unusual or allergic reaction to etoposide, other medicines, foods, dyes, or preservatives  pregnant or trying to get pregnant  breast-feeding How should I use this medicine? This medicine is for infusion into a vein. It is administered in a hospital or clinic by a specially trained health care professional. Talk to your pediatrician regarding the use of this medicine in children. Special care may be needed. Overdosage: If you think you have taken too much of this medicine contact a poison control center or emergency room at once. NOTE: This medicine is only for you. Do not share this medicine with others. What if I miss a dose? It is important not to miss your dose. Call your doctor or health care professional if you are unable to keep an appointment. What may interact with this medicine? This medicine may interact with the following medications:  warfarin This list may not describe all possible interactions. Give your health care provider a list of all the medicines, herbs, non-prescription drugs, or dietary supplements you use. Also tell them if you smoke, drink alcohol, or use illegal drugs. Some items may interact with your medicine. What should I watch for while using this medicine? Visit your doctor for checks on your progress. This drug may make you feel generally unwell. This is not uncommon, as  chemotherapy can affect healthy cells as well as cancer cells. Report any side effects. Continue your course of treatment even though you feel ill unless your doctor tells you to stop. In some cases, you may be given additional medicines to help with side effects. Follow all directions for their use. Call your doctor or health care professional for advice if you get a fever, chills or sore throat, or other symptoms of a cold or flu. Do not treat yourself. This drug decreases your body's ability to fight infections. Try to avoid being around people who are sick. This medicine may increase your risk to bruise or bleed. Call your doctor or health care professional if you notice any unusual bleeding. Talk to your doctor about your risk of cancer. You may be more at risk for certain types of cancers if you take this medicine. Do not become pregnant while taking this medicine or for at least 6 months after stopping it. Women should inform their doctor if they wish to become pregnant or think they might be pregnant. Women of child-bearing potential will need to have a negative pregnancy test before starting this medicine. There is a potential for serious side effects to an unborn child. Talk to your health care professional or pharmacist for more information. Do not breast-feed an infant while taking this medicine. Men must use a latex condom during sexual contact with a woman while taking this medicine and for at least 4 months after stopping it. A latex condom is needed even if you have had a vasectomy. Contact your doctor right away if your partner  becomes pregnant. Do not donate sperm while taking this medicine and for at least 4 months after you stop taking this medicine. Men should inform their doctors if they wish to father a child. This medicine may lower sperm counts. What side effects may I notice from receiving this medicine? Side effects that you should report to your doctor or health care professional  as soon as possible:  allergic reactions like skin rash, itching or hives, swelling of the face, lips, or tongue  low blood counts - this medicine may decrease the number of white blood cells, red blood cells, and platelets. You may be at increased risk for infections and bleeding  nausea, vomiting  redness, blistering, peeling or loosening of the skin, including inside the mouth  signs and symptoms of infection like fever; chills; cough; sore throat; pain or trouble passing urine  signs and symptoms of low red blood cells or anemia such as unusually weak or tired; feeling faint or lightheaded; falls; breathing problems  unusual bruising or bleeding Side effects that usually do not require medical attention (report to your doctor or health care professional if they continue or are bothersome):  changes in taste  diarrhea  hair loss  loss of appetite  mouth sores This list may not describe all possible side effects. Call your doctor for medical advice about side effects. You may report side effects to FDA at 1-800-FDA-1088. Where should I keep my medicine? This drug is given in a hospital or clinic and will not be stored at home. NOTE: This sheet is a summary. It may not cover all possible information. If you have questions about this medicine, talk to your doctor, pharmacist, or health care provider.  2020 Elsevier/Gold Standard (2018-07-18 16:57:15)

## 2019-08-01 ENCOUNTER — Other Ambulatory Visit: Payer: Self-pay | Admitting: Hematology & Oncology

## 2019-08-01 ENCOUNTER — Inpatient Hospital Stay: Payer: Medicare Other

## 2019-08-01 ENCOUNTER — Encounter: Payer: Self-pay | Admitting: *Deleted

## 2019-08-01 VITALS — BP 128/77 | HR 95 | Temp 97.3°F | Resp 18

## 2019-08-01 DIAGNOSIS — C7931 Secondary malignant neoplasm of brain: Secondary | ICD-10-CM | POA: Diagnosis not present

## 2019-08-01 DIAGNOSIS — C4372 Malignant melanoma of left lower limb, including hip: Secondary | ICD-10-CM | POA: Diagnosis not present

## 2019-08-01 DIAGNOSIS — C3490 Malignant neoplasm of unspecified part of unspecified bronchus or lung: Secondary | ICD-10-CM

## 2019-08-01 DIAGNOSIS — C3431 Malignant neoplasm of lower lobe, right bronchus or lung: Secondary | ICD-10-CM | POA: Diagnosis not present

## 2019-08-01 DIAGNOSIS — Z5189 Encounter for other specified aftercare: Secondary | ICD-10-CM | POA: Diagnosis not present

## 2019-08-01 DIAGNOSIS — C787 Secondary malignant neoplasm of liver and intrahepatic bile duct: Secondary | ICD-10-CM | POA: Diagnosis not present

## 2019-08-01 DIAGNOSIS — Z5111 Encounter for antineoplastic chemotherapy: Secondary | ICD-10-CM | POA: Diagnosis not present

## 2019-08-01 MED ORDER — PEGFILGRASTIM-JMDB 6 MG/0.6ML ~~LOC~~ SOSY
6.0000 mg | PREFILLED_SYRINGE | Freq: Once | SUBCUTANEOUS | Status: AC
  Administered 2019-08-01: 6 mg via SUBCUTANEOUS

## 2019-08-01 MED ORDER — FUROSEMIDE 20 MG PO TABS: 40.0000 mg | ORAL_TABLET | Freq: Every day | ORAL | 5 refills | Status: DC

## 2019-08-01 MED ORDER — PEGFILGRASTIM-JMDB 6 MG/0.6ML ~~LOC~~ SOSY
PREFILLED_SYRINGE | SUBCUTANEOUS | Status: AC
  Filled 2019-08-01: qty 0.6

## 2019-08-01 MED ORDER — POTASSIUM CHLORIDE CRYS ER 20 MEQ PO TBCR: 20.0000 meq | EXTENDED_RELEASE_TABLET | Freq: Every day | ORAL | 4 refills | Status: DC

## 2019-08-01 MED FILL — POTASSIUM CHLORIDE CRYS ER: 20 | 30 days supply | Qty: 30 | Fill #0

## 2019-08-01 MED FILL — FUROSEMIDE 20 MG TAB: 20 | 15 days supply | Qty: 30 | Fill #0

## 2019-08-01 NOTE — Patient Instructions (Signed)

## 2019-08-01 NOTE — Progress Notes (Signed)
Patient's wife, Colletta [place], calling the office. Patient has c/o swelling in his lower extremities, but feel like it's becoming worse, and he now has a generalized feeling of fluid retention to his entire body. He is scheduled to come in tomorrow for his Neulasta injection.  Spoke to Dr Marin Olp. We will bring him in this afternoon for his Neulasta injection and assess him at that time. Wife is aware of new appointment and plan.

## 2019-08-02 ENCOUNTER — Inpatient Hospital Stay: Payer: Medicare Other

## 2019-08-06 ENCOUNTER — Telehealth: Payer: Self-pay

## 2019-08-06 ENCOUNTER — Ambulatory Visit: Payer: Medicare Other | Admitting: Family

## 2019-08-06 ENCOUNTER — Ambulatory Visit: Payer: Medicare Other

## 2019-08-06 ENCOUNTER — Other Ambulatory Visit: Payer: Medicare Other

## 2019-08-06 ENCOUNTER — Encounter: Payer: Self-pay | Admitting: *Deleted

## 2019-08-06 DIAGNOSIS — Z03818 Encounter for observation for suspected exposure to other biological agents ruled out: Secondary | ICD-10-CM | POA: Diagnosis not present

## 2019-08-06 DIAGNOSIS — R509 Fever, unspecified: Secondary | ICD-10-CM | POA: Diagnosis not present

## 2019-08-06 DIAGNOSIS — Z20828 Contact with and (suspected) exposure to other viral communicable diseases: Secondary | ICD-10-CM | POA: Diagnosis not present

## 2019-08-06 NOTE — Progress Notes (Signed)
Patient's wife Walter Hernandez calling with the following timeline of concerns:  Thursday patient was seen for swelling. He took the medication prescribed and diuresed well, however that night had severe diarrhea. This symptoms has improved, but patient has felt incredibly weak since that night.  Saturday patient began to have temps of 99-100.9. She had treated with tylenol and currently patient is afebrile, however he has complaints of aching, headache, and sore throat. He and his wife has a covid test yesterday at [facility] and it was negative.    Patient c/o mouth sores and difficulty swallowing. He has two lesions on either side of his mouth that he is treating with abreva.  Reviewed all symptoms with Dr Maylon Peppers. Appointment made for patient to be seen tomorrow morning in the office. Appointment made, and patient's wife is aware of appointment. The following was also reviewed with Walter Hernandez:  If patient has any decline in status, he have a temp of 100.4 or above, or develops any new symptom she is to bring him to the emergency room. She understood this instruction.

## 2019-08-06 NOTE — Progress Notes (Signed)
Received voicemail message from patient's wife, Kennyth Lose. She reports both her and the patient tested negative for COVID. She goes onto explain her husband is running a low grade fever. She is requesting a return call about his appointment with Ashlyn tomorrow at 2 pm.   Requested Windell Norfolk, LPN for [facility], PA-C give her a call back. Izora Gala committed to doing so.

## 2019-08-07 ENCOUNTER — Inpatient Hospital Stay: Payer: Medicare Other | Attending: Hematology & Oncology

## 2019-08-07 ENCOUNTER — Other Ambulatory Visit: Payer: Self-pay

## 2019-08-07 ENCOUNTER — Inpatient Hospital Stay: Payer: Medicare Other

## 2019-08-07 ENCOUNTER — Ambulatory Visit
Admission: RE | Admit: 2019-08-07 | Discharge: 2019-08-07 | Disposition: A | Discharge status: Home / Self Care | Payer: Medicare Other | Source: Ambulatory Visit | Attending: Radiation Oncology | Admitting: Radiation Oncology

## 2019-08-07 ENCOUNTER — Inpatient Hospital Stay (HOSPITAL_BASED_OUTPATIENT_CLINIC_OR_DEPARTMENT_OTHER): Payer: Medicare Other | Admitting: Family

## 2019-08-07 ENCOUNTER — Encounter: Payer: Self-pay | Admitting: Urology

## 2019-08-07 ENCOUNTER — Encounter: Payer: Self-pay | Admitting: Family

## 2019-08-07 VITALS — BP 121/68 | HR 112 | Temp 97.7°F | Resp 18

## 2019-08-07 DIAGNOSIS — C3490 Malignant neoplasm of unspecified part of unspecified bronchus or lung: Secondary | ICD-10-CM | POA: Diagnosis not present

## 2019-08-07 DIAGNOSIS — C342 Malignant neoplasm of middle lobe, bronchus or lung: Secondary | ICD-10-CM | POA: Insufficient documentation

## 2019-08-07 DIAGNOSIS — Z5112 Encounter for antineoplastic immunotherapy: Secondary | ICD-10-CM | POA: Insufficient documentation

## 2019-08-07 DIAGNOSIS — E538 Deficiency of other specified B group vitamins: Secondary | ICD-10-CM

## 2019-08-07 DIAGNOSIS — D509 Iron deficiency anemia, unspecified: Secondary | ICD-10-CM

## 2019-08-07 DIAGNOSIS — C7931 Secondary malignant neoplasm of brain: Secondary | ICD-10-CM | POA: Insufficient documentation

## 2019-08-07 DIAGNOSIS — Z5111 Encounter for antineoplastic chemotherapy: Secondary | ICD-10-CM | POA: Diagnosis not present

## 2019-08-07 DIAGNOSIS — C787 Secondary malignant neoplasm of liver and intrahepatic bile duct: Secondary | ICD-10-CM | POA: Insufficient documentation

## 2019-08-07 DIAGNOSIS — C7800 Secondary malignant neoplasm of unspecified lung: Secondary | ICD-10-CM | POA: Insufficient documentation

## 2019-08-07 DIAGNOSIS — Z510 Encounter for antineoplastic radiation therapy: Secondary | ICD-10-CM | POA: Insufficient documentation

## 2019-08-07 DIAGNOSIS — C3431 Malignant neoplasm of lower lobe, right bronchus or lung: Secondary | ICD-10-CM | POA: Insufficient documentation

## 2019-08-07 DIAGNOSIS — Z79899 Other long term (current) drug therapy: Secondary | ICD-10-CM | POA: Insufficient documentation

## 2019-08-07 DIAGNOSIS — R609 Edema, unspecified: Secondary | ICD-10-CM | POA: Diagnosis not present

## 2019-08-07 DIAGNOSIS — D649 Anemia, unspecified: Secondary | ICD-10-CM | POA: Diagnosis not present

## 2019-08-07 DIAGNOSIS — C4372 Malignant melanoma of left lower limb, including hip: Secondary | ICD-10-CM | POA: Insufficient documentation

## 2019-08-07 LAB — CBC WITH DIFFERENTIAL (CANCER CENTER ONLY)
Abs Immature Granulocytes: 0.64 10*3/uL — ABNORMAL HIGH (ref 0.00–0.07)
Basophils Absolute: 0.1 10*3/uL (ref 0.0–0.1)
Basophils Relative: 1 %
Eosinophils Absolute: 0 10*3/uL (ref 0.0–0.5)
Eosinophils Relative: 0 %
HCT: 25.9 % — ABNORMAL LOW (ref 39.0–52.0)
Hemoglobin: 9 g/dL — ABNORMAL LOW (ref 13.0–17.0)
Immature Granulocytes: 11 %
Lymphocytes Relative: 22 %
Lymphs Abs: 1.2 10*3/uL (ref 0.7–4.0)
MCH: 33.3 pg (ref 26.0–34.0)
MCHC: 34.7 g/dL (ref 30.0–36.0)
MCV: 95.9 fL (ref 80.0–100.0)
Monocytes Absolute: 0.6 10*3/uL (ref 0.1–1.0)
Monocytes Relative: 11 %
Neutro Abs: 3 10*3/uL (ref 1.7–7.7)
Neutrophils Relative %: 55 %
Platelet Count: 172 10*3/uL (ref 150–400)
RBC: 2.7 MIL/uL — ABNORMAL LOW (ref 4.22–5.81)
RDW: 14.6 % (ref 11.5–15.5)
WBC Count: 5.6 10*3/uL (ref 4.0–10.5)
nRBC: 0.5 % — ABNORMAL HIGH (ref 0.0–0.2)

## 2019-08-07 LAB — CBC WITH DIFFERENTIAL/PLATELET
Abs Immature Granulocytes: 0.86 10*3/uL — ABNORMAL HIGH (ref 0.00–0.07)
Basophils Absolute: 0.1 10*3/uL (ref 0.0–0.1)
Basophils Relative: 1 %
Eosinophils Absolute: 0 10*3/uL (ref 0.0–0.5)
Eosinophils Relative: 0 %
HCT: 22.9 % — ABNORMAL LOW (ref 39.0–52.0)
Hemoglobin: 8.1 g/dL — ABNORMAL LOW (ref 13.0–17.0)
Immature Granulocytes: 16 %
Lymphocytes Relative: 15 %
Lymphs Abs: 0.8 10*3/uL (ref 0.7–4.0)
MCH: 33.8 pg (ref 26.0–34.0)
MCHC: 35.4 g/dL (ref 30.0–36.0)
MCV: 95.4 fL (ref 80.0–100.0)
Monocytes Absolute: 0.6 10*3/uL (ref 0.1–1.0)
Monocytes Relative: 11 %
Neutro Abs: 2.9 10*3/uL (ref 1.7–7.7)
Neutrophils Relative %: 57 %
Platelets: 161 10*3/uL (ref 150–400)
RBC: 2.4 MIL/uL — ABNORMAL LOW (ref 4.22–5.81)
RDW: 14.6 % (ref 11.5–15.5)
WBC: 5.2 10*3/uL (ref 4.0–10.5)
nRBC: 0.8 % — ABNORMAL HIGH (ref 0.0–0.2)

## 2019-08-07 LAB — RETICULOCYTES
Immature Retic Fract: 52.9 % — ABNORMAL HIGH (ref 2.3–15.9)
RBC.: 2.41 MIL/uL — ABNORMAL LOW (ref 4.22–5.81)
Retic Count, Absolute: 36.9 10*3/uL (ref 19.0–186.0)
Retic Ct Pct: 1.5 % (ref 0.4–3.1)

## 2019-08-07 LAB — CMP (CANCER CENTER ONLY)
ALT: 30 U/L (ref 0–44)
AST: 30 U/L (ref 15–41)
Albumin: 4.1 g/dL (ref 3.5–5.0)
Alkaline Phosphatase: 83 U/L (ref 38–126)
Anion gap: 11 (ref 5–15)
BUN: 13 mg/dL (ref 8–23)
CO2: 29 mmol/L (ref 22–32)
Calcium: 9.3 mg/dL (ref 8.9–10.3)
Chloride: 94 mmol/L — ABNORMAL LOW (ref 98–111)
Creatinine: 0.66 mg/dL (ref 0.61–1.24)
GFR, Est AFR Am: >60 mL/min (ref >60)
GFR, Estimated: >60 mL/min (ref >60)
Glucose, Bld: 97 mg/dL (ref 70–99)
Potassium: 3.6 mmol/L (ref 3.5–5.1)
Sodium: 134 mmol/L — ABNORMAL LOW (ref 135–145)
Total Bilirubin: 0.6 mg/dL (ref 0.3–1.2)
Total Protein: 7.3 g/dL (ref 6.5–8.1)

## 2019-08-07 LAB — SAMPLE TO BLOOD BANK

## 2019-08-07 LAB — LACTATE DEHYDROGENASE: LDH: 480 U/L — ABNORMAL HIGH (ref 98–192)

## 2019-08-07 LAB — ABO/RH: ABO/RH(D): O POS

## 2019-08-07 LAB — PREPARE RBC (CROSSMATCH)

## 2019-08-07 LAB — VITAMIN B12: Vitamin B-12: 2463 pg/mL — ABNORMAL HIGH (ref 180–914)

## 2019-08-07 MED ORDER — SODIUM CHLORIDE 0.9 % IV SOLN
Freq: Once | INTRAVENOUS | Status: AC
  Filled 2019-08-07: qty 250

## 2019-08-07 NOTE — Patient Instructions (Signed)

## 2019-08-07 NOTE — Progress Notes (Signed)
Radiation Oncology         [phone] ________________________________  Name: Walter Hernandez MRN: [id number]  Date: 08/07/2019  DOB: May 03, 1951  Post Treatment Note  CC: Lavone Orn, MD  Lavone Orn, MD  Diagnosis:   69 yo man with brain metastases from extensive stage small cell lung cancer.  Interval Since Last Radiation:  5 weeks  1/13-1/26/21:   The whole brain was treated to 30 Gy in 10 fractions of 3 Gy  Narrative:  I spoke with the patient and his wife to conduct his routine scheduled 1 month follow up visit via telephone to spare the patient unnecessary potential exposure in the healthcare setting during the current COVID-19 pandemic.  The patient was notified in advance and gave permission to proceed with this visit format. The patient tolerated radiation treatment very well without any ill acute side effects. He was tapered off the decadron following completion of his radiation and has tolerated that well.  He has completed 2 cycles of systemic chemotherapy with Carboplatin/VP-16/Tecentriq- started on 07/07/2018 under the care and direction of Dr. Marin Olp and overall, feels that he is tolerating this well. The plan is to continue this current regimen for now.                          On review of systems, the patient states that he is doing fairly well in general.  He has been more fatigued and has struggled with some N/V/D recently, felt likely secondary to his systemic therapy.  He had lab work today and is scheduled for blood transfusion on Friday 08/09/19. Otherwise, he denies headaches, dizziness, paraesthesias, focal weakness, tremors or seizure activity.  He reports dramatic improvement the left sided weakness within a week of starting radiation and this is now completely resolved which he is delighted with. Overall, he is quite pleased with his progress to date.  ALLERGIES:  has No Known Allergies.  Meds: Current Outpatient Medications  Medication Sig Dispense Refill  .  acetaminophen (TYLENOL) 500 MG tablet Take 1,000 mg by mouth every 6 (six) hours as needed for moderate pain.    Marland Kitchen ALPRAZolam (XANAX) 0.5 MG tablet Take 1 tablet (0.5 mg total) by mouth every 6 (six) hours as needed for anxiety. 60 tablet 0  . doxycycline (VIBRA-TABS) 100 MG tablet Take 1 tablet (100 mg total) by mouth 2 (two) times daily. 14 tablet 0  . famciclovir (FAMVIR) 500 MG tablet Take 500 mg by mouth every morning.    . finasteride (PROSCAR) 5 MG tablet Take 1 tablet (5 mg total) by mouth daily. 30 tablet 3  . furosemide (LASIX) 20 MG tablet Take 2 tablets (40 mg total) by mouth daily. 30 tablet 5  . IBUPROFEN & ACETAMINOPHEN PO Take 1-2 tablets by mouth every 6 (six) hours as needed (pain).    Marland Kitchen lidocaine-prilocaine (EMLA) cream Apply to affected area once 30 g 3  . LORazepam (ATIVAN) 0.5 MG tablet 1 tablet po 30 minutes prior to radiation or MRI 30 tablet 0  . LORazepam (ATIVAN) 0.5 MG tablet Take 1 tablet (0.5 mg total) by mouth every 6 (six) hours as needed (Nausea or vomiting). 30 tablet 0  . memantine (NAMENDA) 5 MG tablet Starting 06/18/19 Take one tab po daily. Starting 06/25/19 Take one tab po q am and one tab po q pm. Starting 1/26/21Take two tabs po q am, and one tab po q pm. Starting 07/09/19 Take two tabs po q  am and two tabs po q pm until 12/03/19. 602 tablet 0  . Multiple Vitamin (MULTIVITAMIN) capsule Take 1 capsule by mouth daily.    . Naphazoline-Pheniramine (OPCON-A OP) Place 2 drops into both eyes as needed (for dry eyes).    . nicotine (NICODERM CQ - DOSED IN MG/24 HOURS) 21 mg/24hr patch PLACE 1 PATCH (21 MG TOTAL) ONTO THE SKIN DAILY. 28 patch 2  . nystatin (MYCOSTATIN) 100000 UNIT/ML suspension Take 5 mLs (500,000 Units total) by mouth 4 (four) times daily. 60 mL 0  . omeprazole (PRILOSEC) 40 MG capsule Take 40 mg by mouth daily.    . ondansetron (ZOFRAN) 8 MG tablet Take 1 tablet (8 mg total) by mouth 2 (two) times daily as needed for refractory nausea / vomiting. Start  on day 3 after carboplatin chemo. 30 tablet 1  . potassium chloride SA (KLOR-CON) 20 MEQ tablet Take 1 tablet (20 mEq total) by mouth daily. 30 tablet 4  . prochlorperazine (COMPAZINE) 10 MG tablet Take 1 tablet (10 mg total) by mouth every 6 (six) hours as needed (Nausea or vomiting). 30 tablet 1  . sucralfate (CARAFATE) 1 g tablet TAKE 1 TABLET (1 G TOTAL) BY MOUTH 4 (FOUR) TIMES DAILY. DISSOLVE EACH TABLET IN 15 CC WATER. 120 tablet 0  . tamsulosin (FLOMAX) 0.4 MG CAPS capsule Take 2 capsules (0.8 mg total) by mouth daily. (Patient taking differently: Take 0.4 mg by mouth 2 (two) times daily. ) 60 capsule 1  . ondansetron (ZOFRAN) 4 MG tablet Take 1 tablet (4 mg total) by mouth every 6 (six) hours as needed for nausea. 20 tablet 0   No current facility-administered medications for this encounter.    Physical Findings:  vitals were not taken for this visit.   /Unable to assess due to telephone follow-up visit format.  Lab Findings: Lab Results  Component Value Date   WBC 5.6 08/07/2019   HGB 9.0 (L) 08/07/2019   HCT 25.9 (L) 08/07/2019   MCV 95.9 08/07/2019   PLT 172 08/07/2019     Radiographic Findings: No results found.  Impression/Plan: 12. 69 yo man with brain metastases from extensive stage small cell lung cancer. He appears to have recovered well from the effects of his recent palliative whole brain radiation and currently remains without complaints.  He is off of the Decadron at this time and doing well.  He has continued taking Namenda daily as prescribed and is tolerating this well.  He plans to continue taking the Namenda as directed until the end of June in an effort to prevent/reduce cognitive side effects associated with whole brain radiation.  We discussed the plan to repeat an MRI of the brain in May 2021 to assess treatment response and pending this scan is stable, we will continue to monitor the brain closely with serial MRI scans of the brain every 3 months for the  first 1-2 years and then every 4-6 months thereafter. He does require pre-medication with Ativan to tolerate the MRI scans so I will make sure this is available to him for use prior to each scan. We will follow up by telephone following each scan to review results and recommendation from the multi-disciplinary brain conference.   He will also continue in routine follow-up under the care and direction of Dr. Marin Olp for continued management of his systemic disease.  He and his wife appear to have a good understanding of these recommendations and are comfortable and in agreement with the stated plan.  They know to call at anytime with any questions or concerns related to his previous radiation.    Nicholos Johns, PA-C

## 2019-08-07 NOTE — Progress Notes (Signed)
Hematology and Oncology Follow Up Visit  Walter Hernandez [id number] 07/03/1950 69 y.o. 08/07/2019   Principle Diagnosis:  Stage IB ([id number]) superficial spreading melanoma of the left lower leg  Small Cell Lung Cancer -- Extensive Stage -- lung/liver/brain mets  Past Therapy: Cranial XRT -- 06/19/2019 thru 07/02/2019  Current Therapy:   Carboplatin/VP-16/Tecentriq -- started on 07/07/2018, s/p cycle 2   Interim History:  Walter Hernandez is here today with his wife for follow-up and fluids. He started lasix and KDUR last week for generalized fluid retention. This has since resolved but over the weekend he developed a fever of 100 and diarrhea for 2 days.  He has not felt like eating and admittedly has not been hydrating well. His weight is down 5 lbs since his last visit (08/01/2019).  He is afebrile at this time and the diarrhea has subsided. No chills, n/v, rash, dizziness, SOB, chest pain, palpitations, abdominal pain or changes in bladder habits.  He has a dry cough and has noted a headache since he completed his second cycle of treatment.  He is feeling fatigued and states that he just wants to sleep all the time.  No falls or syncopal episodes to report.  No swelling, tenderness, numbness or tingling in his extremities at this time.  He has not noted any episodes of blood loss. No abnormal bruising or petechiae.   ECOG Performance Status: 2 - Symptomatic, <50% confined to bed  Medications:  Allergies as of 08/07/2019   No Known Allergies     Medication List       Accurate as of August 07, 2019 10:15 AM. If you have any questions, ask your nurse or doctor.        acetaminophen 500 MG tablet Commonly known as: TYLENOL Take 1,000 mg by mouth every 6 (six) hours as needed for moderate pain.   ALPRAZolam 0.5 MG tablet Commonly known as: XANAX Take 1 tablet (0.5 mg total) by mouth every 6 (six) hours as needed for anxiety.   doxycycline 100 MG tablet Commonly known as: VIBRA-TABS Take  1 tablet (100 mg total) by mouth 2 (two) times daily.   famciclovir 500 MG tablet Commonly known as: FAMVIR Take 500 mg by mouth every morning.   finasteride 5 MG tablet Commonly known as: Proscar Take 1 tablet (5 mg total) by mouth daily.   furosemide 20 MG tablet Commonly known as: LASIX Take 2 tablets (40 mg total) by mouth daily.   IBUPROFEN & ACETAMINOPHEN PO Take 1-2 tablets by mouth every 6 (six) hours as needed (pain).   lidocaine-prilocaine cream Commonly known as: EMLA Apply to affected area once   LORazepam 0.5 MG tablet Commonly known as: ATIVAN 1 tablet po 30 minutes prior to radiation or MRI   LORazepam 0.5 MG tablet Commonly known as: Ativan Take 1 tablet (0.5 mg total) by mouth every 6 (six) hours as needed (Nausea or vomiting).   memantine 5 MG tablet Commonly known as: Namenda Starting 06/18/19 Take one tab po daily. Starting 06/25/19 Take one tab po q am and one tab po q pm. Starting 1/26/21Take two tabs po q am, and one tab po q pm. Starting 07/09/19 Take two tabs po q am and two tabs po q pm until 12/03/19.   multivitamin capsule Take 1 capsule by mouth daily.   nicotine 21 mg/24hr patch Commonly known as: NICODERM CQ - dosed in mg/24 hours PLACE 1 PATCH (21 MG TOTAL) ONTO THE SKIN DAILY.   nystatin 100000 UNIT/ML  suspension Commonly known as: MYCOSTATIN Take 5 mLs (500,000 Units total) by mouth 4 (four) times daily.   omeprazole 40 MG capsule Commonly known as: PRILOSEC Take 40 mg by mouth daily.   ondansetron 4 MG tablet Commonly known as: ZOFRAN Take 1 tablet (4 mg total) by mouth every 6 (six) hours as needed for nausea.   ondansetron 8 MG tablet Commonly known as: Zofran Take 1 tablet (8 mg total) by mouth 2 (two) times daily as needed for refractory nausea / vomiting. Start on day 3 after carboplatin chemo.   OPCON-A OP Place 2 drops into both eyes as needed (for dry eyes).   potassium chloride SA 20 MEQ tablet Commonly known as:  KLOR-CON Take 1 tablet (20 mEq total) by mouth daily.   prochlorperazine 10 MG tablet Commonly known as: COMPAZINE Take 1 tablet (10 mg total) by mouth every 6 (six) hours as needed (Nausea or vomiting).   sucralfate 1 g tablet Commonly known as: CARAFATE TAKE 1 TABLET (1 G TOTAL) BY MOUTH 4 (FOUR) TIMES DAILY. DISSOLVE EACH TABLET IN 15 CC WATER.   tamsulosin 0.4 MG Caps capsule Commonly known as: FLOMAX Take 2 capsules (0.8 mg total) by mouth daily. What changed:   how much to take  when to take this       Allergies: No Known Allergies  Past Medical History, Surgical history, Social history, and Family History were reviewed and updated.  Review of Systems: All other 10 point review of systems is negative.   Physical Exam:  temperature is 97.7 F (36.5 C). His blood pressure is 121/68 and his pulse is 112 (abnormal). His respiration is 18 and oxygen saturation is 98%.   Wt Readings from Last 3 Encounters:  07/29/19 161 lb 6.4 oz (73.2 kg)  06/20/19 144 lb 12.8 oz (65.7 kg)  06/17/19 143 lb 3.2 oz (65 kg)    Ocular: Sclerae unicteric, pupils equal, round and reactive to light Ear-nose-throat: Oropharynx clear, dentition fair Lymphatic: No cervical or supraclavicular adenopathy Lungs no rales or rhonchi, good excursion bilaterally Heart regular rate and rhythm, no murmur appreciated Abd soft, nontender, positive bowel sounds, no liver or spleen tip palpated on exam, no fluid wave  MSK no focal spinal tenderness, no joint edema Neuro: non-focal, well-oriented, appropriate affect Breasts: Deferred   Lab Results  Component Value Date   WBC 38.1 (H) 07/29/2019   HGB 11.3 (L) 07/29/2019   HCT 32.7 (L) 07/29/2019   MCV 96.2 07/29/2019   PLT 359 07/29/2019   No results found for: FERRITIN, IRON, TIBC, UIBC, IRONPCTSAT Lab Results  Component Value Date   RBC 3.40 (L) 07/29/2019   No results found for: KPAFRELGTCHN, LAMBDASER, KAPLAMBRATIO No results found for:  IGGSERUM, IGA, IGMSERUM No results found for: Odetta Pink, SPEI   Chemistry      Component Value Date/Time   NA 139 07/29/2019 1026   NA 142 01/30/2017 0804   K 4.2 07/29/2019 1026   K 4.1 01/30/2017 0804   CL 101 07/29/2019 1026   CL 105 01/25/2016 0926   CO2 30 07/29/2019 1026   CO2 29 01/30/2017 0804   BUN 14 07/29/2019 1026   BUN 6.9 (L) 01/30/2017 0804   CREATININE 0.68 07/29/2019 1026   CREATININE 0.8 01/30/2017 0804      Component Value Date/Time   CALCIUM 9.5 07/29/2019 1026   CALCIUM 10.5 (H) 01/30/2017 0804   ALKPHOS 66 07/29/2019 1026   ALKPHOS 79 01/30/2017  0804   AST 18 07/29/2019 1026   AST 34 01/30/2017 0804   ALT 17 07/29/2019 1026   ALT 18 01/30/2017 0804   BILITOT 0.4 07/29/2019 1026   BILITOT 1.49 (H) 01/30/2017 0804       Impression and Plan: Mr. Wagster is a very pleasant 69 yo caucasian gentleman with history of stage 1b melanoma of the left lower leg and now  Extensive stage small cell lung cancer with liver and brian mets.  We will give him Iv fluids today and then repeat CBC for Hgb incase transfusion is needed as well as get a TIBC, ferritin, retic count and B 12 level.  He will get his PET scan next week on 08/12/2019 and has MD follow-up on 3/16.  They will contact our office with any questions or concerns. We can certainly see him sooner if needed.   Laverna Peace, NP 3/3/202110:15 AM    Addendum: Repeat Hgb is 7.9, we will type and cross for 2 units of blood on Friday at 9 am. Patient and wife are aware and agree with the plan.

## 2019-08-08 ENCOUNTER — Other Ambulatory Visit: Payer: Self-pay | Admitting: Family

## 2019-08-08 LAB — IRON AND TIBC
Iron: 33 ug/dL — ABNORMAL LOW (ref 42–163)
Saturation Ratios: 16 % — ABNORMAL LOW (ref 20–55)
TIBC: 200 ug/dL — ABNORMAL LOW (ref 202–409)
UIBC: 167 ug/dL (ref 117–376)

## 2019-08-08 LAB — FERRITIN: Ferritin: 4817 ng/mL — ABNORMAL HIGH (ref 24–336)

## 2019-08-09 ENCOUNTER — Encounter: Payer: Self-pay | Admitting: *Deleted

## 2019-08-09 ENCOUNTER — Inpatient Hospital Stay: Payer: Medicare Other

## 2019-08-09 ENCOUNTER — Other Ambulatory Visit: Payer: Self-pay

## 2019-08-09 DIAGNOSIS — C3431 Malignant neoplasm of lower lobe, right bronchus or lung: Secondary | ICD-10-CM | POA: Diagnosis not present

## 2019-08-09 DIAGNOSIS — Z5111 Encounter for antineoplastic chemotherapy: Secondary | ICD-10-CM | POA: Diagnosis not present

## 2019-08-09 DIAGNOSIS — D649 Anemia, unspecified: Secondary | ICD-10-CM

## 2019-08-09 DIAGNOSIS — C787 Secondary malignant neoplasm of liver and intrahepatic bile duct: Secondary | ICD-10-CM | POA: Diagnosis not present

## 2019-08-09 DIAGNOSIS — C7800 Secondary malignant neoplasm of unspecified lung: Secondary | ICD-10-CM | POA: Diagnosis not present

## 2019-08-09 DIAGNOSIS — C4372 Malignant melanoma of left lower limb, including hip: Secondary | ICD-10-CM | POA: Diagnosis not present

## 2019-08-09 DIAGNOSIS — Z5112 Encounter for antineoplastic immunotherapy: Secondary | ICD-10-CM | POA: Diagnosis not present

## 2019-08-09 DIAGNOSIS — C3490 Malignant neoplasm of unspecified part of unspecified bronchus or lung: Secondary | ICD-10-CM

## 2019-08-09 MED ORDER — ACETAMINOPHEN 325 MG PO TABS
ORAL_TABLET | ORAL | Status: AC
  Filled 2019-08-09: qty 2

## 2019-08-09 MED ORDER — DIPHENHYDRAMINE HCL 25 MG PO CAPS
ORAL_CAPSULE | ORAL | Status: AC
  Filled 2019-08-09: qty 1

## 2019-08-09 MED ORDER — SODIUM CHLORIDE 0.9% IV SOLUTION
250.0000 mL | Freq: Once | INTRAVENOUS | Status: AC
  Administered 2019-08-09: 250 mL via INTRAVENOUS
  Filled 2019-08-09: qty 250

## 2019-08-09 MED ORDER — ACETAMINOPHEN 325 MG PO TABS: 650.0000 mg | ORAL_TABLET | Freq: Once | ORAL | Status: DC

## 2019-08-09 MED ORDER — DIPHENHYDRAMINE HCL 25 MG PO CAPS
25.0000 mg | ORAL_CAPSULE | Freq: Once | ORAL | Status: AC
  Administered 2019-08-09: 25 mg via ORAL

## 2019-08-09 MED ORDER — SODIUM CHLORIDE 0.9% FLUSH
10.0000 mL | INTRAVENOUS | Status: DC | PRN
  Administered 2019-08-09: 10 mL via INTRAVENOUS
  Filled 2019-08-09: qty 10

## 2019-08-09 MED ORDER — ACETAMINOPHEN 325 MG PO TABS
650.0000 mg | ORAL_TABLET | Freq: Once | ORAL | Status: AC
  Administered 2019-08-09: 650 mg via ORAL

## 2019-08-09 MED ORDER — HEPARIN SOD (PORK) LOCK FLUSH 100 UNIT/ML IV SOLN
500.0000 [IU] | Freq: Once | INTRAVENOUS | Status: AC
  Administered 2019-08-09: 500 [IU] via INTRAVENOUS
  Filled 2019-08-09: qty 5

## 2019-08-09 NOTE — Progress Notes (Signed)
Patient's temp 100 at end of 2nd unit. Per dr Marin Olp give 2 tylenol 325 mg tabs now. Done

## 2019-08-09 NOTE — Patient Instructions (Signed)

## 2019-08-10 LAB — TYPE AND SCREEN
ABO/RH(D): O POS
Antibody Screen: NEGATIVE
Unit division: 0
Unit division: 0

## 2019-08-10 LAB — BPAM RBC
Blood Product Expiration Date: 202104032359
Blood Product Expiration Date: 202104032359
ISSUE DATE / TIME: 202103050822
ISSUE DATE / TIME: 202103050822
Unit Type and Rh: 5100
Unit Type and Rh: 5100

## 2019-08-12 ENCOUNTER — Other Ambulatory Visit: Payer: Self-pay

## 2019-08-12 ENCOUNTER — Ambulatory Visit (HOSPITAL_COMMUNITY)
Admission: RE | Admit: 2019-08-12 | Discharge: 2019-08-12 | Disposition: A | Discharge status: Home / Self Care | Payer: Medicare Other | Source: Ambulatory Visit | Attending: Hematology & Oncology | Admitting: Hematology & Oncology

## 2019-08-12 ENCOUNTER — Telehealth: Payer: Self-pay | Admitting: *Deleted

## 2019-08-12 ENCOUNTER — Encounter: Payer: Self-pay | Admitting: *Deleted

## 2019-08-12 DIAGNOSIS — Z79899 Other long term (current) drug therapy: Secondary | ICD-10-CM | POA: Diagnosis not present

## 2019-08-12 DIAGNOSIS — C787 Secondary malignant neoplasm of liver and intrahepatic bile duct: Secondary | ICD-10-CM | POA: Insufficient documentation

## 2019-08-12 DIAGNOSIS — C3490 Malignant neoplasm of unspecified part of unspecified bronchus or lung: Secondary | ICD-10-CM | POA: Insufficient documentation

## 2019-08-12 DIAGNOSIS — C778 Secondary and unspecified malignant neoplasm of lymph nodes of multiple regions: Secondary | ICD-10-CM | POA: Diagnosis not present

## 2019-08-12 DIAGNOSIS — R911 Solitary pulmonary nodule: Secondary | ICD-10-CM | POA: Insufficient documentation

## 2019-08-12 DIAGNOSIS — Z5111 Encounter for antineoplastic chemotherapy: Secondary | ICD-10-CM | POA: Diagnosis not present

## 2019-08-12 LAB — GLUCOSE, CAPILLARY: Glucose-Capillary: 95 mg/dL (ref 70–99)

## 2019-08-12 IMAGING — PT NM PET TUM IMG RESTAG (PS) SKULL BASE T - THIGH
1 of 9 series · 1 of 25 positions shown · non-contrast
Comparison: PET-CT [DATE]. CTs of the chest, abdomen and pelvis
[DATE].

CLINICAL DATA: Subsequent treatment strategy for extensive stage
small cell lung cancer post 2 cycles of chemotherapy.

EXAM:
NUCLEAR MEDICINE PET SKULL BASE TO THIGH
TECHNIQUE: 8.03 mCi F-18 FDG was injected intravenously. Full-ring PET imaging
was performed from the skull base to thigh after the radiotracer. CT
data was obtained and used for attenuation correction and anatomic
localization.
Fasting blood glucose: 95 mg/dl

[Series 4: ct sk_thigh 5.0 b31f · axial · 5.0mm · 0.98mm/px · 1 of 237 slices shown]
[im 237/237  brain]
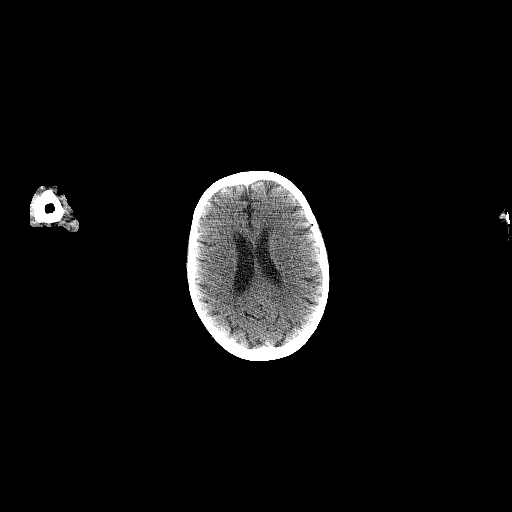

[1 of 25 positions shown; findings below may reference images not displayed]

FINDINGS: Mediastinal blood pool activity: SUV max

NECK:

Hypermetabolic posterolateral right supraclavicular node measures
2.3 x 2.1 cm on image 51/4 and has an SUV max of 7.2. Previously
this measured 3.0 x 2.4 cm with an SUV max of 12.3.Small medial
right supraclavicular node has also improved. No new hypermetabolic
adenopathy. No lesions of the pharyngeal mucosal space.

Incidental CT findings: Bilateral carotid atherosclerosis.

CHEST:

Previously demonstrated multifocal hypermetabolic mediastinal and
right hilar adenopathy has improved. For example, a 1.3 cm right
paratracheal node on image 64/4 has an SUV max of 9.0 (previously
10.4). Medial AP window node measuring 2.0 cm on image 73/4 has an
SUV max of 7.1 (previously 9.3). No definite progressive adenopathy.
There is no suspicious pulmonary activity or increased pulmonary
nodularity. Right lower lobe pulmonary nodule on image 38/8 is
unchanged, without hypermetabolic activity.

Incidental CT findings: Increased dependent opacities in both lungs.
Atherosclerosis of the aorta, great vessels and coronary arteries.
New right IJ Port-A-Cath extending to the superior cavoatrial
junction.

ABDOMEN/PELVIS:

The previously demonstrated hypermetabolic hepatic metastases have
improved. The greatest residual focus of hypermetabolic activity
centrally in the right hepatic lobe has an SUV max of
(previously 6.1). This may correspond with a lesion measuring 12 mm
on image 106/4. The adenopathy in the porta hepatis has mildly
improved in extent. Portacaval node measuring 1.9 cm on image 122/4
has an SUV max of 12.8 (previously 2.6 cm, SUV max 10.7). No new
lesions in the abdomen or pelvis.

Incidental CT findings: Aortic and branch vessel atherosclerosis.
Diverticular changes of the sigmoid colon and mild enlargement of
the prostate gland.

SKELETON:

There is no hypermetabolic activity to suggest osseous metastatic
disease. New diffuse low-level marrow activity attributed to
chemotherapy response.

Incidental CT findings: Degenerative changes throughout the spine
associated with a convex right lumbar scoliosis.
IMPRESSION: 1. Interval improvement in the degree and extent of hypermetabolic
metastatic disease associated with right supraclavicular, thoracic
and upper abdominal lymph nodes as well as multifocal hepatic
metastases.
2. Stable right lower lobe pulmonary nodule without hypermetabolic
activity.
3. No focal osseous lesions. Low-level diffuse marrow activity
attributed to chemotherapy response.

## 2019-08-12 MED ORDER — FLUDEOXYGLUCOSE F - 18 (FDG) INJECTION
8.0300 | Freq: Once | INTRAVENOUS | Status: AC | PRN
  Administered 2019-08-12: 10:00:00 8.03 via INTRAVENOUS

## 2019-08-12 NOTE — Telephone Encounter (Signed)
-----   Message from Volanda Napoleon, MD sent at 08/12/2019  1:22 PM EST ----- Call - the PET scan shows that the cancer is smaller!!  Treatment is working!!  Laurey Arrow

## 2019-08-12 NOTE — Progress Notes (Signed)
Walter Hernandez is calling the office with multiple concerns. Patient still does not feel well. He came in last week for fluids, and then 2 units of PRBC on Friday. She states his energy level did increase over the weekend, but that he is still "miserable". She states he is not eating anything. He has no desire to eat, and even when she provides his desired food, he'll eat one or two bites and then says he can't eat anymore.   He also continues to have fevers with headaches. Walter Hernandez states "He will tell me his head is starting to hurt, and I know that means his fever is coming back." This happens several time a day. He will treat with tylenol which is effective with both the headache and fever, but then both will eventually return.   She also states that [name] said that he wanted her to take him to the emergency room.   Reviewed all the above with Dr Marin Olp. He recommends that patient be brought to the emergency room for work up.   Spoke to [place] and relayed Dr Antonieta Pert recommendation. She stated she will bring him to [facility].

## 2019-08-12 NOTE — Telephone Encounter (Signed)
Pt states that he is returning a call from this office.  Pt notified per order of Dr. Marin Olp that "the PET scan shows that the cancer is smaller!!  Treatment is working!!"  Pt appreciative of call and states that he is debating whether or not to go to the ED as instructed per Dr. Marin Olp. Instructed pt to go to the ED as ordered per Dr. Marin Olp.  He states that he will see how he feels in a little bit and call office if he decides not to go.

## 2019-08-15 NOTE — Telephone Encounter (Signed)
Attempted to reach patient no answer left message for patient to return phone call

## 2019-08-15 NOTE — Telephone Encounter (Signed)
patient not feeling well patient has a MD appointment in the morning at 9am will contact wife before follow up appointment to see if patient is able to keep follow up appointment

## 2019-08-16 ENCOUNTER — Other Ambulatory Visit: Payer: Self-pay | Admitting: Hematology & Oncology

## 2019-08-20 ENCOUNTER — Inpatient Hospital Stay (HOSPITAL_BASED_OUTPATIENT_CLINIC_OR_DEPARTMENT_OTHER): Payer: Medicare Other | Admitting: Hematology & Oncology

## 2019-08-20 ENCOUNTER — Inpatient Hospital Stay: Payer: Medicare Other

## 2019-08-20 ENCOUNTER — Encounter: Payer: Self-pay | Admitting: *Deleted

## 2019-08-20 ENCOUNTER — Encounter: Payer: Self-pay | Admitting: Hematology & Oncology

## 2019-08-20 ENCOUNTER — Other Ambulatory Visit: Payer: Self-pay

## 2019-08-20 VITALS — HR 74

## 2019-08-20 VITALS — BP 104/75 | HR 123 | Temp 97.3°F | Resp 16 | Wt 149.0 lb

## 2019-08-20 DIAGNOSIS — C3431 Malignant neoplasm of lower lobe, right bronchus or lung: Secondary | ICD-10-CM | POA: Diagnosis not present

## 2019-08-20 DIAGNOSIS — Z5111 Encounter for antineoplastic chemotherapy: Secondary | ICD-10-CM | POA: Diagnosis not present

## 2019-08-20 DIAGNOSIS — C3490 Malignant neoplasm of unspecified part of unspecified bronchus or lung: Secondary | ICD-10-CM

## 2019-08-20 DIAGNOSIS — D649 Anemia, unspecified: Secondary | ICD-10-CM

## 2019-08-20 DIAGNOSIS — Z5112 Encounter for antineoplastic immunotherapy: Secondary | ICD-10-CM | POA: Diagnosis not present

## 2019-08-20 DIAGNOSIS — C4372 Malignant melanoma of left lower limb, including hip: Secondary | ICD-10-CM

## 2019-08-20 DIAGNOSIS — C7800 Secondary malignant neoplasm of unspecified lung: Secondary | ICD-10-CM | POA: Diagnosis not present

## 2019-08-20 DIAGNOSIS — C787 Secondary malignant neoplasm of liver and intrahepatic bile duct: Secondary | ICD-10-CM | POA: Diagnosis not present

## 2019-08-20 LAB — CMP (CANCER CENTER ONLY)
ALT: 12 U/L (ref 0–44)
AST: 21 U/L (ref 15–41)
Albumin: 4 g/dL (ref 3.5–5.0)
Alkaline Phosphatase: 81 U/L (ref 38–126)
Anion gap: 10 (ref 5–15)
BUN: 10 mg/dL (ref 8–23)
CO2: 30 mmol/L (ref 22–32)
Calcium: 9.9 mg/dL (ref 8.9–10.3)
Chloride: 95 mmol/L — ABNORMAL LOW (ref 98–111)
Creatinine: 0.86 mg/dL (ref 0.61–1.24)
GFR, Est AFR Am: >60 mL/min (ref >60)
GFR, Estimated: >60 mL/min (ref >60)
Glucose, Bld: 106 mg/dL — ABNORMAL HIGH (ref 70–99)
Potassium: 4.4 mmol/L (ref 3.5–5.1)
Sodium: 135 mmol/L (ref 135–145)
Total Bilirubin: 0.6 mg/dL (ref 0.3–1.2)
Total Protein: 7.1 g/dL (ref 6.5–8.1)

## 2019-08-20 LAB — CBC WITH DIFFERENTIAL (CANCER CENTER ONLY)
Abs Immature Granulocytes: 3.44 10*3/uL — ABNORMAL HIGH (ref 0.00–0.07)
Basophils Absolute: 0.3 10*3/uL — ABNORMAL HIGH (ref 0.0–0.1)
Basophils Relative: 1 %
Eosinophils Absolute: 0.1 10*3/uL (ref 0.0–0.5)
Eosinophils Relative: 0 %
HCT: 36.1 % — ABNORMAL LOW (ref 39.0–52.0)
Hemoglobin: 12 g/dL — ABNORMAL LOW (ref 13.0–17.0)
Immature Granulocytes: 16 %
Lymphocytes Relative: 12 %
Lymphs Abs: 2.7 10*3/uL (ref 0.7–4.0)
MCH: 31.3 pg (ref 26.0–34.0)
MCHC: 33.2 g/dL (ref 30.0–36.0)
MCV: 94.3 fL (ref 80.0–100.0)
Monocytes Absolute: 3.2 10*3/uL — ABNORMAL HIGH (ref 0.1–1.0)
Monocytes Relative: 14 %
Neutro Abs: 12.4 10*3/uL — ABNORMAL HIGH (ref 1.7–7.7)
Neutrophils Relative %: 57 %
Platelet Count: 537 10*3/uL — ABNORMAL HIGH (ref 150–400)
RBC: 3.83 MIL/uL — ABNORMAL LOW (ref 4.22–5.81)
RDW: 15.6 % — ABNORMAL HIGH (ref 11.5–15.5)
WBC Count: 21.9 10*3/uL — ABNORMAL HIGH (ref 4.0–10.5)
nRBC: 0.2 % (ref 0.0–0.2)

## 2019-08-20 LAB — LACTATE DEHYDROGENASE: LDH: 462 U/L — ABNORMAL HIGH (ref 98–192)

## 2019-08-20 MED ORDER — SODIUM CHLORIDE 0.9 % IV SOLN
150.0000 mg | Freq: Once | INTRAVENOUS | Status: AC
  Administered 2019-08-20: 11:00:00 150 mg via INTRAVENOUS
  Filled 2019-08-20: qty 5

## 2019-08-20 MED ORDER — SODIUM CHLORIDE 0.9 % IV SOLN
Freq: Once | INTRAVENOUS | Status: AC
  Filled 2019-08-20: qty 250

## 2019-08-20 MED ORDER — DEXAMETHASONE SODIUM PHOSPHATE 10 MG/ML IJ SOLN
INTRAMUSCULAR | Status: AC
  Filled 2019-08-20: qty 1

## 2019-08-20 MED ORDER — SODIUM CHLORIDE 0.9 % IV SOLN
1200.0000 mg | Freq: Once | INTRAVENOUS | Status: AC
  Administered 2019-08-20: 12:00:00 1200 mg via INTRAVENOUS
  Filled 2019-08-20: qty 20

## 2019-08-20 MED ORDER — PALONOSETRON HCL INJECTION 0.25 MG/5ML
INTRAVENOUS | Status: AC
  Filled 2019-08-20: qty 5

## 2019-08-20 MED ORDER — SODIUM CHLORIDE 0.9 % IV SOLN
90.0000 mg/m2 | Freq: Once | INTRAVENOUS | Status: AC
  Administered 2019-08-20: 14:00:00 160 mg via INTRAVENOUS
  Filled 2019-08-20: qty 8

## 2019-08-20 MED ORDER — PALONOSETRON HCL INJECTION 0.25 MG/5ML
0.2500 mg | Freq: Once | INTRAVENOUS | Status: AC
  Administered 2019-08-20: 0.25 mg via INTRAVENOUS

## 2019-08-20 MED ORDER — DEXAMETHASONE SODIUM PHOSPHATE 10 MG/ML IJ SOLN
10.0000 mg | Freq: Once | INTRAMUSCULAR | Status: AC
  Administered 2019-08-20: 11:00:00 10 mg via INTRAVENOUS

## 2019-08-20 MED ORDER — SODIUM CHLORIDE 0.9% FLUSH
10.0000 mL | INTRAVENOUS | Status: DC | PRN
  Administered 2019-08-20: 10 mL
  Filled 2019-08-20: qty 10

## 2019-08-20 MED ORDER — SODIUM CHLORIDE 0.9 % IV SOLN
441.0000 mg | Freq: Once | INTRAVENOUS | Status: AC
  Administered 2019-08-20: 13:00:00 440 mg via INTRAVENOUS
  Filled 2019-08-20: qty 44

## 2019-08-20 MED ORDER — HEPARIN SOD (PORK) LOCK FLUSH 100 UNIT/ML IV SOLN
500.0000 [IU] | Freq: Once | INTRAVENOUS | Status: AC | PRN
  Administered 2019-08-20: 15:00:00 500 [IU]
  Filled 2019-08-20: qty 5

## 2019-08-20 MED FILL — FUROSEMIDE 20 MG TAB: 20 | 15 days supply | Qty: 30 | Fill #1

## 2019-08-20 NOTE — Progress Notes (Signed)
Hematology and Oncology Follow Up Visit  Walter Hernandez [id number] 1950-09-08 69 y.o. 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  He is doing better.  I think that the Lasix definitely has helped him.  His weight is down quite a bit.  He has no swelling in his legs.  At this point time, I told him only to take Lasix if his weight goes up 3-5 pounds or if he is always have edema in his legs.  His a PET scan that we did on him back on 08/12/2019 shows a nice response.  His cancer is responding in all areas.  He has had no problems with headache.  He has had no mouth sores.  There is been no fever.  He has had no cough.  There is no bleeding.  He has had no diarrhea.  I am just happy that his quality of life is improving.  Overall, his performance status for now is ECOG 1.   Medications:  Allergies as of 08/20/2019   No Known Allergies     Medication List       Accurate as of August 20, 2019 10:27 AM. If you have any questions, ask your nurse or doctor.        acetaminophen 500 MG tablet Commonly known as: TYLENOL Take 1,000 mg by mouth every 6 (six) hours as needed for moderate pain.   ALPRAZolam 0.5 MG tablet Commonly known as: XANAX Take 1 tablet (0.5 mg total) by mouth every 6 (six) hours as needed for anxiety.   doxycycline 100 MG tablet Commonly known as: VIBRA-TABS Take 1 tablet (100 mg total) by mouth 2 (two) times daily.   famciclovir 500 MG tablet Commonly known as: FAMVIR Take 500 mg by mouth every morning.   finasteride 5 MG tablet Commonly known as: Proscar Take 1 tablet (5 mg total) by mouth daily.   furosemide 20 MG  tablet Commonly known as: LASIX Take 2 tablets (40 mg total) by mouth daily.   IBUPROFEN & ACETAMINOPHEN PO Take 1-2 tablets by mouth every 6 (six) hours as needed (pain).   lidocaine-prilocaine cream Commonly known as: EMLA Apply to affected area once   LORazepam 0.5 MG tablet Commonly known as: ATIVAN 1 tablet po 30 minutes prior to radiation or MRI   LORazepam 0.5 MG tablet Commonly known as: Ativan Take 1 tablet (0.5 mg total) by mouth every 6 (six) hours as needed (Nausea or vomiting).   memantine 5 MG tablet Commonly known as: Namenda Starting 06/18/19 Take one tab po daily. Starting 06/25/19 Take one tab po q am and one tab po q pm. Starting 1/26/21Take two tabs po q am, and one tab po q pm. Starting 07/09/19 Take two tabs po q am and two tabs po q pm until 12/03/19.   multivitamin capsule Take 1 capsule by mouth daily.   nicotine 21 mg/24hr patch Commonly known as: NICODERM CQ - dosed in mg/24 hours PLACE 1 PATCH (21 MG TOTAL) ONTO THE SKIN DAILY.   nystatin 100000 UNIT/ML suspension Commonly known as: MYCOSTATIN Take 5 mLs (500,000 Units total) by mouth 4 (four) times daily.   omeprazole 40 MG capsule Commonly known  as: PRILOSEC Take 40 mg by mouth daily.   ondansetron 4 MG tablet Commonly known as: ZOFRAN Take 1 tablet (4 mg total) by mouth every 6 (six) hours as needed for nausea.   ondansetron 8 MG tablet Commonly known as: Zofran Take 1 tablet (8 mg total) by mouth 2 (two) times daily as needed for refractory nausea / vomiting. Start on day 3 after carboplatin chemo.   OPCON-A OP Place 2 drops into both eyes as needed (for dry eyes).   potassium chloride SA 20 MEQ tablet Commonly known as: KLOR-CON Take 1 tablet (20 mEq total) by mouth daily.   prochlorperazine 10 MG tablet Commonly known as: COMPAZINE Take 1 tablet (10 mg total) by mouth every 6 (six) hours as needed (Nausea or vomiting).   sucralfate 1 g tablet Commonly known as: CARAFATE TAKE 1  TABLET (1 G TOTAL) BY MOUTH 4 (FOUR) TIMES DAILY. DISSOLVE EACH TABLET IN 15 CC WATER.   tamsulosin 0.4 MG Caps capsule Commonly known as: FLOMAX Take 2 capsules (0.8 mg total) by mouth daily. What changed:   how much to take  when to take this       Allergies: No Known Allergies  Past Medical History, Surgical history, Social history, and Family History were reviewed and updated.  Review of Systems: Review of Systems  Constitutional: Negative.   HENT: Negative.   Eyes: Negative.   Respiratory: Negative.   Cardiovascular: Negative.   Gastrointestinal: Negative.   Genitourinary: Negative.   Musculoskeletal: Negative.   Skin: Negative.   Neurological: Negative.   Endo/Heme/Allergies: Negative.   Psychiatric/Behavioral: Negative.      Physical Exam:  weight is 149 lb (67.6 kg). His temporal temperature is 97.3 F (36.3 C) (abnormal). His blood pressure is 104/75 and his pulse is 123 (abnormal). His respiration is 16 and oxygen saturation is 100%.   Wt Readings from Last 3 Encounters:  08/20/19 149 lb (67.6 kg)  07/29/19 161 lb 6.4 oz (73.2 kg)  06/20/19 144 lb 12.8 oz (65.7 kg)    Physical Exam Vitals reviewed.  HENT:     Head: Normocephalic and atraumatic.  Eyes:     Pupils: Pupils are equal, round, and reactive to light.  Cardiovascular:     Rate and Rhythm: Normal rate and regular rhythm.     Heart sounds: Normal heart sounds.  Pulmonary:     Effort: Pulmonary effort is normal.     Breath sounds: Normal breath sounds.  Abdominal:     General: Bowel sounds are normal.     Palpations: Abdomen is soft.  Musculoskeletal:        General: No tenderness or deformity. Normal range of motion.     Cervical back: Normal range of motion.  Lymphadenopathy:     Cervical: No cervical adenopathy.  Skin:    General: Skin is warm and dry.     Findings: No erythema or rash.  Neurological:     Mental Status: He is alert and oriented to person, place, and time.   Psychiatric:        Behavior: Behavior normal.        Thought Content: Thought content normal.        Judgment: Judgment normal.       Lab Results  Component Value Date   WBC 21.9 (H) 08/20/2019   HGB 12.0 (L) 08/20/2019   HCT 36.1 (L) 08/20/2019   MCV 94.3 08/20/2019   PLT 537 (H) 08/20/2019   Lab Results  Component  Value Date   FERRITIN 4,817 (H) 08/07/2019   IRON 33 (L) 08/07/2019   TIBC 200 (L) 08/07/2019   UIBC 167 08/07/2019   IRONPCTSAT 16 (L) 08/07/2019   Lab Results  Component Value Date   RETICCTPCT 1.5 08/07/2019   RBC 3.83 (L) 08/20/2019   No results found for: KPAFRELGTCHN, LAMBDASER, KAPLAMBRATIO No results found for: IGGSERUM, IGA, IGMSERUM No results found for: Kathrynn Ducking, MSPIKE, SPEI   Chemistry      Component Value Date/Time   NA 134 (L) 08/07/2019 0948   NA 142 01/30/2017 0804   K 3.6 08/07/2019 0948   K 4.1 01/30/2017 0804   CL 94 (L) 08/07/2019 0948   CL 105 01/25/2016 0926   CO2 29 08/07/2019 0948   CO2 29 01/30/2017 0804   BUN 13 08/07/2019 0948   BUN 6.9 (L) 01/30/2017 0804   CREATININE 0.66 08/07/2019 0948   CREATININE 0.8 01/30/2017 0804      Component Value Date/Time   CALCIUM 9.3 08/07/2019 0948   CALCIUM 10.5 (H) 01/30/2017 0804   ALKPHOS 83 08/07/2019 0948   ALKPHOS 79 01/30/2017 0804   AST 30 08/07/2019 0948   AST 34 01/30/2017 0804   ALT 30 08/07/2019 0948   ALT 18 01/30/2017 0804   BILITOT 0.6 08/07/2019 0948   BILITOT 1.49 (H) 01/30/2017 0804       Impression and Plan: Walter Hernandez is a very pleasant 69 yo caucasian gentleman with history of stage 1b melanoma of the left lower leg.    He now has extensive stage small cell lung cancer.  He presented with brain metastasis.  He had cranial radiation therapy for this.  His chemotherapy is working.  We will make some adjustments with the dosing to try to help with his quality of life.  We will go ahead and proceed with  cycle 3 of treatment.  I am glad that his blood counts are better.  I think this will certainly help his quality of life.  I know this is quite complicated.  He has had difficulties with treatment.  The fluid retention is definitely better right now.  We will plan to get him back to see [place] in another 3 weeks.    Volanda Napoleon, MD 3/16/202110:27 AM    .

## 2019-08-20 NOTE — Patient Instructions (Signed)
Etoposide, VP-16 injection What is this medicine? ETOPOSIDE, VP-16 (e toe POE side) is a chemotherapy drug. It is used to treat testicular cancer, lung cancer, and other cancers. This medicine may be used for other purposes; ask your health care provider or pharmacist if you have questions. COMMON BRAND NAME(S): Etopophos, Toposar, VePesid What should I tell my health care provider before I take this medicine? They need to know if you have any of these conditions:  infection  kidney disease  liver disease  low blood counts, like low white cell, platelet, or red cell counts  an unusual or allergic reaction to etoposide, other medicines, foods, dyes, or preservatives  pregnant or trying to get pregnant  breast-feeding How should I use this medicine? This medicine is for infusion into a vein. It is administered in a hospital or clinic by a specially trained health care professional. Talk to your pediatrician regarding the use of this medicine in children. Special care may be needed. Overdosage: If you think you have taken too much of this medicine contact a poison control center or emergency room at once. NOTE: This medicine is only for you. Do not share this medicine with others. What if I miss a dose? It is important not to miss your dose. Call your doctor or health care professional if you are unable to keep an appointment. What may interact with this medicine? This medicine may interact with the following medications:  warfarin This list may not describe all possible interactions. Give your health care provider a list of all the medicines, herbs, non-prescription drugs, or dietary supplements you use. Also tell them if you smoke, drink alcohol, or use illegal drugs. Some items may interact with your medicine. What should I watch for while using this medicine? Visit your doctor for checks on your progress. This drug may make you feel generally unwell. This is not uncommon, as  chemotherapy can affect healthy cells as well as cancer cells. Report any side effects. Continue your course of treatment even though you feel ill unless your doctor tells you to stop. In some cases, you may be given additional medicines to help with side effects. Follow all directions for their use. Call your doctor or health care professional for advice if you get a fever, chills or sore throat, or other symptoms of a cold or flu. Do not treat yourself. This drug decreases your body's ability to fight infections. Try to avoid being around people who are sick. This medicine may increase your risk to bruise or bleed. Call your doctor or health care professional if you notice any unusual bleeding. Talk to your doctor about your risk of cancer. You may be more at risk for certain types of cancers if you take this medicine. Do not become pregnant while taking this medicine or for at least 6 months after stopping it. Women should inform their doctor if they wish to become pregnant or think they might be pregnant. Women of child-bearing potential will need to have a negative pregnancy test before starting this medicine. There is a potential for serious side effects to an unborn child. Talk to your health care professional or pharmacist for more information. Do not breast-feed an infant while taking this medicine. Men must use a latex condom during sexual contact with a woman while taking this medicine and for at least 4 months after stopping it. A latex condom is needed even if you have had a vasectomy. Contact your doctor right away if your partner  becomes pregnant. Do not donate sperm while taking this medicine and for at least 4 months after you stop taking this medicine. Men should inform their doctors if they wish to father a child. This medicine may lower sperm counts. What side effects may I notice from receiving this medicine? Side effects that you should report to your doctor or health care professional  as soon as possible:  allergic reactions like skin rash, itching or hives, swelling of the face, lips, or tongue  low blood counts - this medicine may decrease the number of white blood cells, red blood cells, and platelets. You may be at increased risk for infections and bleeding  nausea, vomiting  redness, blistering, peeling or loosening of the skin, including inside the mouth  signs and symptoms of infection like fever; chills; cough; sore throat; pain or trouble passing urine  signs and symptoms of low red blood cells or anemia such as unusually weak or tired; feeling faint or lightheaded; falls; breathing problems  unusual bruising or bleeding Side effects that usually do not require medical attention (report to your doctor or health care professional if they continue or are bothersome):  changes in taste  diarrhea  hair loss  loss of appetite  mouth sores This list may not describe all possible side effects. Call your doctor for medical advice about side effects. You may report side effects to FDA at 1-800-FDA-1088. Where should I keep my medicine? This drug is given in a hospital or clinic and will not be stored at home. NOTE: This sheet is a summary. It may not cover all possible information. If you have questions about this medicine, talk to your doctor, pharmacist, or health care provider.  2020 Elsevier/Gold Standard (2018-07-18 16:57:15)

## 2019-08-21 ENCOUNTER — Inpatient Hospital Stay: Payer: Medicare Other

## 2019-08-21 ENCOUNTER — Other Ambulatory Visit: Payer: Self-pay

## 2019-08-21 VITALS — BP 135/87 | HR 83 | Temp 98.7°F | Resp 18

## 2019-08-21 DIAGNOSIS — C7800 Secondary malignant neoplasm of unspecified lung: Secondary | ICD-10-CM | POA: Diagnosis not present

## 2019-08-21 DIAGNOSIS — Z5112 Encounter for antineoplastic immunotherapy: Secondary | ICD-10-CM | POA: Diagnosis not present

## 2019-08-21 DIAGNOSIS — C4372 Malignant melanoma of left lower limb, including hip: Secondary | ICD-10-CM | POA: Diagnosis not present

## 2019-08-21 DIAGNOSIS — C3431 Malignant neoplasm of lower lobe, right bronchus or lung: Secondary | ICD-10-CM | POA: Diagnosis not present

## 2019-08-21 DIAGNOSIS — Z5111 Encounter for antineoplastic chemotherapy: Secondary | ICD-10-CM | POA: Diagnosis not present

## 2019-08-21 DIAGNOSIS — C3490 Malignant neoplasm of unspecified part of unspecified bronchus or lung: Secondary | ICD-10-CM

## 2019-08-21 DIAGNOSIS — C787 Secondary malignant neoplasm of liver and intrahepatic bile duct: Secondary | ICD-10-CM | POA: Diagnosis not present

## 2019-08-21 MED ORDER — HEPARIN SOD (PORK) LOCK FLUSH 100 UNIT/ML IV SOLN
500.0000 [IU] | Freq: Once | INTRAVENOUS | Status: AC | PRN
  Administered 2019-08-21: 500 [IU]
  Filled 2019-08-21: qty 5

## 2019-08-21 MED ORDER — DEXAMETHASONE SODIUM PHOSPHATE 10 MG/ML IJ SOLN
INTRAMUSCULAR | Status: AC
  Filled 2019-08-21: qty 1

## 2019-08-21 MED ORDER — SODIUM CHLORIDE 0.9 % IV SOLN
90.0000 mg/m2 | Freq: Once | INTRAVENOUS | Status: AC
  Administered 2019-08-21: 160 mg via INTRAVENOUS
  Filled 2019-08-21: qty 8

## 2019-08-21 MED ORDER — DEXAMETHASONE SODIUM PHOSPHATE 10 MG/ML IJ SOLN
10.0000 mg | Freq: Once | INTRAMUSCULAR | Status: AC
  Administered 2019-08-21: 10 mg via INTRAVENOUS

## 2019-08-21 MED ORDER — SODIUM CHLORIDE 0.9% FLUSH
10.0000 mL | INTRAVENOUS | Status: DC | PRN
  Administered 2019-08-21: 10 mL
  Filled 2019-08-21: qty 10

## 2019-08-21 MED ORDER — SODIUM CHLORIDE 0.9 % IV SOLN
Freq: Once | INTRAVENOUS | Status: AC
  Filled 2019-08-21: qty 250

## 2019-08-21 NOTE — Patient Instructions (Signed)
Etoposide, VP-16 injection What is this medicine? ETOPOSIDE, VP-16 (e toe POE side) is a chemotherapy drug. It is used to treat testicular cancer, lung cancer, and other cancers. This medicine may be used for other purposes; ask your health care provider or pharmacist if you have questions. COMMON BRAND NAME(S): Etopophos, Toposar, VePesid What should I tell my health care provider before I take this medicine? They need to know if you have any of these conditions:  infection  kidney disease  liver disease  low blood counts, like low white cell, platelet, or red cell counts  an unusual or allergic reaction to etoposide, other medicines, foods, dyes, or preservatives  pregnant or trying to get pregnant  breast-feeding How should I use this medicine? This medicine is for infusion into a vein. It is administered in a hospital or clinic by a specially trained health care professional. Talk to your pediatrician regarding the use of this medicine in children. Special care may be needed. Overdosage: If you think you have taken too much of this medicine contact a poison control center or emergency room at once. NOTE: This medicine is only for you. Do not share this medicine with others. What if I miss a dose? It is important not to miss your dose. Call your doctor or health care professional if you are unable to keep an appointment. What may interact with this medicine? This medicine may interact with the following medications:  warfarin This list may not describe all possible interactions. Give your health care provider a list of all the medicines, herbs, non-prescription drugs, or dietary supplements you use. Also tell them if you smoke, drink alcohol, or use illegal drugs. Some items may interact with your medicine. What should I watch for while using this medicine? Visit your doctor for checks on your progress. This drug may make you feel generally unwell. This is not uncommon, as  chemotherapy can affect healthy cells as well as cancer cells. Report any side effects. Continue your course of treatment even though you feel ill unless your doctor tells you to stop. In some cases, you may be given additional medicines to help with side effects. Follow all directions for their use. Call your doctor or health care professional for advice if you get a fever, chills or sore throat, or other symptoms of a cold or flu. Do not treat yourself. This drug decreases your body's ability to fight infections. Try to avoid being around people who are sick. This medicine may increase your risk to bruise or bleed. Call your doctor or health care professional if you notice any unusual bleeding. Talk to your doctor about your risk of cancer. You may be more at risk for certain types of cancers if you take this medicine. Do not become pregnant while taking this medicine or for at least 6 months after stopping it. Women should inform their doctor if they wish to become pregnant or think they might be pregnant. Women of child-bearing potential will need to have a negative pregnancy test before starting this medicine. There is a potential for serious side effects to an unborn child. Talk to your health care professional or pharmacist for more information. Do not breast-feed an infant while taking this medicine. Men must use a latex condom during sexual contact with a woman while taking this medicine and for at least 4 months after stopping it. A latex condom is needed even if you have had a vasectomy. Contact your doctor right away if your partner  becomes pregnant. Do not donate sperm while taking this medicine and for at least 4 months after you stop taking this medicine. Men should inform their doctors if they wish to father a child. This medicine may lower sperm counts. What side effects may I notice from receiving this medicine? Side effects that you should report to your doctor or health care professional  as soon as possible:  allergic reactions like skin rash, itching or hives, swelling of the face, lips, or tongue  low blood counts - this medicine may decrease the number of white blood cells, red blood cells, and platelets. You may be at increased risk for infections and bleeding  nausea, vomiting  redness, blistering, peeling or loosening of the skin, including inside the mouth  signs and symptoms of infection like fever; chills; cough; sore throat; pain or trouble passing urine  signs and symptoms of low red blood cells or anemia such as unusually weak or tired; feeling faint or lightheaded; falls; breathing problems  unusual bruising or bleeding Side effects that usually do not require medical attention (report to your doctor or health care professional if they continue or are bothersome):  changes in taste  diarrhea  hair loss  loss of appetite  mouth sores This list may not describe all possible side effects. Call your doctor for medical advice about side effects. You may report side effects to FDA at 1-800-FDA-1088. Where should I keep my medicine? This drug is given in a hospital or clinic and will not be stored at home. NOTE: This sheet is a summary. It may not cover all possible information. If you have questions about this medicine, talk to your doctor, pharmacist, or health care provider.  2020 Elsevier/Gold Standard (2018-07-18 16:57:15)

## 2019-08-22 ENCOUNTER — Inpatient Hospital Stay: Payer: Medicare Other

## 2019-08-22 VITALS — BP 136/79 | HR 78 | Temp 97.1°F | Resp 16

## 2019-08-22 DIAGNOSIS — Z5112 Encounter for antineoplastic immunotherapy: Secondary | ICD-10-CM | POA: Diagnosis not present

## 2019-08-22 DIAGNOSIS — Z5111 Encounter for antineoplastic chemotherapy: Secondary | ICD-10-CM | POA: Diagnosis not present

## 2019-08-22 DIAGNOSIS — C3490 Malignant neoplasm of unspecified part of unspecified bronchus or lung: Secondary | ICD-10-CM

## 2019-08-22 DIAGNOSIS — C7800 Secondary malignant neoplasm of unspecified lung: Secondary | ICD-10-CM | POA: Diagnosis not present

## 2019-08-22 DIAGNOSIS — C3431 Malignant neoplasm of lower lobe, right bronchus or lung: Secondary | ICD-10-CM | POA: Diagnosis not present

## 2019-08-22 DIAGNOSIS — C4372 Malignant melanoma of left lower limb, including hip: Secondary | ICD-10-CM | POA: Diagnosis not present

## 2019-08-22 DIAGNOSIS — C787 Secondary malignant neoplasm of liver and intrahepatic bile duct: Secondary | ICD-10-CM | POA: Diagnosis not present

## 2019-08-22 MED ORDER — HEPARIN SOD (PORK) LOCK FLUSH 100 UNIT/ML IV SOLN
500.0000 [IU] | Freq: Once | INTRAVENOUS | Status: AC | PRN
  Administered 2019-08-22: 500 [IU]
  Filled 2019-08-22: qty 5

## 2019-08-22 MED ORDER — SODIUM CHLORIDE 0.9 % IV SOLN
90.0000 mg/m2 | Freq: Once | INTRAVENOUS | Status: AC
  Administered 2019-08-22: 160 mg via INTRAVENOUS
  Filled 2019-08-22: qty 8

## 2019-08-22 MED ORDER — DEXAMETHASONE SODIUM PHOSPHATE 10 MG/ML IJ SOLN
10.0000 mg | Freq: Once | INTRAMUSCULAR | Status: AC
  Administered 2019-08-22: 10 mg via INTRAVENOUS

## 2019-08-22 MED ORDER — DEXAMETHASONE SODIUM PHOSPHATE 10 MG/ML IJ SOLN
INTRAMUSCULAR | Status: AC
  Filled 2019-08-22: qty 1

## 2019-08-22 MED ORDER — SODIUM CHLORIDE 0.9% FLUSH
10.0000 mL | INTRAVENOUS | Status: DC | PRN
  Administered 2019-08-22: 10 mL
  Filled 2019-08-22: qty 10

## 2019-08-22 MED ORDER — SODIUM CHLORIDE 0.9 % IV SOLN
Freq: Once | INTRAVENOUS | Status: AC
  Filled 2019-08-22: qty 250

## 2019-08-22 NOTE — Patient Instructions (Signed)
Etoposide, VP-16 capsules What is this medicine? ETOPOSIDE, VP-16 (e toe POE side) is a chemotherapy drug. It is used to treat small cell lung cancer and other cancers. This medicine may be used for other purposes; ask your health care provider or pharmacist if you have questions. COMMON BRAND NAME(S): VePesid What should I tell my health care provider before I take this medicine? They need to know if you have any of these conditions:  infection  kidney disease  liver disease  low blood counts, like low white cell, platelet, or red cell counts  an unusual or allergic reaction to etoposide, other medicines, foods, dyes, or preservatives  pregnant or trying to get pregnant  breast-feeding How should I use this medicine? Take this medicine by mouth with a glass of water. Follow the directions on the prescription label. Do not open, crush, or chew the capsules. It is advisable to wear gloves when handling this medicine. Take your medicine at regular intervals. Do not take it more often than directed. Do not stop taking except on your doctor's advice. Talk to your pediatrician regarding the use of this medicine in children. Special care may be needed. Overdosage: If you think you have taken too much of this medicine contact a poison control center or emergency room at once. NOTE: This medicine is only for you. Do not share this medicine with others. What if I miss a dose? If you miss a dose, take it as soon as you can. If it is almost time for your next dose, take only that dose. Do not take double or extra doses. What may interact with this medicine? This medicine may interact with the following medications:  cyclosporine  warfarin This list may not describe all possible interactions. Give your health care provider a list of all the medicines, herbs, non-prescription drugs, or dietary supplements you use. Also tell them if you smoke, drink alcohol, or use illegal drugs. Some items may  interact with your medicine. What should I watch for while using this medicine? Visit your doctor for checks on your progress. This drug may make you feel generally unwell. This is not uncommon, as chemotherapy can affect healthy cells as well as cancer cells. Report any side effects. Continue your course of treatment even though you feel ill unless your doctor tells you to stop. In some cases, you may be given additional medicines to help with side effects. Follow all directions for their use. Call your doctor or health care professional for advice if you get a fever, chills or sore throat, or other symptoms of a cold or flu. Do not treat yourself. This drug decreases your body's ability to fight infections. Try to avoid being around people who are sick. This medicine may increase your risk to bruise or bleed. Call your doctor or health care professional if you notice any unusual bleeding. Talk to your doctor about your risk of cancer. You may be more at risk for certain types of cancers if you take this medicine. Do not become pregnant while taking this medicine or for at least 6 months after stopping it. Women should inform their doctor if they wish to become pregnant or think they might be pregnant. Women of child-bearing potential will need to have a negative pregnancy test before starting this medicine. There is a potential for serious side effects to an unborn child. Talk to your health care professional or pharmacist for more information. Do not breast-feed an infant while taking this medicine. Men  must use a latex condom during sexual contact with a woman while taking this medicine and for at least 4 months after stopping it. A latex condom is needed even if you have had a vasectomy. Contact your doctor right away if your partner becomes pregnant. Do not donate sperm while taking this medicine and for 4 months after you stop taking this medicine. Men should inform their doctors if they wish to  father a child. This medicine may lower sperm counts. What side effects may I notice from receiving this medicine? Side effects that you should report to your doctor or health care professional as soon as possible:  allergic reactions like skin rash, itching or hives, swelling of the face, lips, or tongue  low blood counts - this medicine may decrease the number of white blood cells, red blood cells, and platelets. You may be at increased risk for infections and bleeding  nausea, vomiting  redness, blistering, peeling or loosening of the skin, including inside the mouth  signs and symptoms of infection like fever; chills; cough; sore throat; pain or trouble passing urine  signs and symptoms of low red blood cells or anemia such as unusually weak or tired; feeling faint or lightheaded; falls; breathing problems  unusual bruising or bleeding Side effects that usually do not require medical attention (report to your doctor or health care professional if they continue or are bothersome):  changes in taste  diarrhea  hair loss  loss of appetite  mouth sores This list may not describe all possible side effects. Call your doctor for medical advice about side effects. You may report side effects to FDA at 1-800-FDA-1088. Where should I keep my medicine? Keep out of the reach of children. Store in a refrigerator between 2 and 8 degrees C (36 and 46 degrees F). Do not freeze. Throw away any unused medicine after the expiration date. NOTE: This sheet is a summary. It may not cover all possible information. If you have questions about this medicine, talk to your doctor, pharmacist, or health care provider.  2020 [employer]vier/Gold Standard (2018-07-18 16:44:55)

## 2019-08-23 ENCOUNTER — Inpatient Hospital Stay: Payer: Medicare Other

## 2019-08-23 ENCOUNTER — Other Ambulatory Visit: Payer: Self-pay

## 2019-08-23 VITALS — BP 99/73 | HR 106 | Temp 97.3°F | Resp 16

## 2019-08-23 DIAGNOSIS — C7800 Secondary malignant neoplasm of unspecified lung: Secondary | ICD-10-CM | POA: Diagnosis not present

## 2019-08-23 DIAGNOSIS — Z5111 Encounter for antineoplastic chemotherapy: Secondary | ICD-10-CM | POA: Diagnosis not present

## 2019-08-23 DIAGNOSIS — C4372 Malignant melanoma of left lower limb, including hip: Secondary | ICD-10-CM | POA: Diagnosis not present

## 2019-08-23 DIAGNOSIS — C3490 Malignant neoplasm of unspecified part of unspecified bronchus or lung: Secondary | ICD-10-CM

## 2019-08-23 DIAGNOSIS — C787 Secondary malignant neoplasm of liver and intrahepatic bile duct: Secondary | ICD-10-CM | POA: Diagnosis not present

## 2019-08-23 DIAGNOSIS — Z5112 Encounter for antineoplastic immunotherapy: Secondary | ICD-10-CM | POA: Diagnosis not present

## 2019-08-23 DIAGNOSIS — C3431 Malignant neoplasm of lower lobe, right bronchus or lung: Secondary | ICD-10-CM | POA: Diagnosis not present

## 2019-08-23 MED ORDER — PEGFILGRASTIM-JMDB 6 MG/0.6ML ~~LOC~~ SOSY
6.0000 mg | PREFILLED_SYRINGE | Freq: Once | SUBCUTANEOUS | Status: AC
  Administered 2019-08-23: 6 mg via SUBCUTANEOUS

## 2019-08-23 MED ORDER — PEGFILGRASTIM-JMDB 6 MG/0.6ML ~~LOC~~ SOSY
PREFILLED_SYRINGE | SUBCUTANEOUS | Status: AC
  Filled 2019-08-23: qty 0.6

## 2019-08-23 NOTE — Patient Instructions (Signed)

## 2019-09-03 NOTE — Progress Notes (Signed)
Pharmacist Chemotherapy Monitoring - Follow Up Assessment    I verify that I have reviewed each item in the below checklist:  . Regimen for the patient is scheduled for the appropriate day and plan matches scheduled date. Marland Kitchen Appropriate non-routine labs are ordered dependent on drug ordered. . If applicable, additional medications reviewed and ordered per protocol based on lifetime cumulative doses and/or treatment regimen.   Plan for follow-up and/or issues identified: No . I-vent associated with next due treatment: No . MD and/or nursing notified: No  Walter Hernandez, Walter Hernandez 09/03/2019 10:20 AM

## 2019-09-04 ENCOUNTER — Other Ambulatory Visit: Payer: Self-pay | Admitting: Radiation Therapy

## 2019-09-04 DIAGNOSIS — C7931 Secondary malignant neoplasm of brain: Secondary | ICD-10-CM

## 2019-09-05 ENCOUNTER — Ambulatory Visit: Payer: Medicare Other

## 2019-09-07 DIAGNOSIS — Z23 Encounter for immunization: Secondary | ICD-10-CM | POA: Diagnosis not present

## 2019-09-10 ENCOUNTER — Encounter: Payer: Self-pay | Admitting: Family

## 2019-09-10 ENCOUNTER — Inpatient Hospital Stay: Payer: Medicare Other | Attending: Hematology & Oncology

## 2019-09-10 ENCOUNTER — Inpatient Hospital Stay: Payer: Medicare Other

## 2019-09-10 ENCOUNTER — Other Ambulatory Visit: Payer: Self-pay

## 2019-09-10 ENCOUNTER — Encounter: Payer: Self-pay | Admitting: *Deleted

## 2019-09-10 ENCOUNTER — Inpatient Hospital Stay (HOSPITAL_BASED_OUTPATIENT_CLINIC_OR_DEPARTMENT_OTHER): Payer: Medicare Other | Admitting: Family

## 2019-09-10 VITALS — BP 124/83 | HR 103 | Temp 99.3°F | Resp 17 | Ht 67.0 in | Wt 145.1 lb

## 2019-09-10 DIAGNOSIS — C7931 Secondary malignant neoplasm of brain: Secondary | ICD-10-CM | POA: Diagnosis not present

## 2019-09-10 DIAGNOSIS — C787 Secondary malignant neoplasm of liver and intrahepatic bile duct: Secondary | ICD-10-CM | POA: Insufficient documentation

## 2019-09-10 DIAGNOSIS — C3431 Malignant neoplasm of lower lobe, right bronchus or lung: Secondary | ICD-10-CM | POA: Insufficient documentation

## 2019-09-10 DIAGNOSIS — R634 Abnormal weight loss: Secondary | ICD-10-CM

## 2019-09-10 DIAGNOSIS — C4372 Malignant melanoma of left lower limb, including hip: Secondary | ICD-10-CM | POA: Insufficient documentation

## 2019-09-10 DIAGNOSIS — R1310 Dysphagia, unspecified: Secondary | ICD-10-CM | POA: Diagnosis not present

## 2019-09-10 DIAGNOSIS — D509 Iron deficiency anemia, unspecified: Secondary | ICD-10-CM

## 2019-09-10 DIAGNOSIS — E032 Hypothyroidism due to medicaments and other exogenous substances: Secondary | ICD-10-CM

## 2019-09-10 DIAGNOSIS — Z5189 Encounter for other specified aftercare: Secondary | ICD-10-CM | POA: Diagnosis not present

## 2019-09-10 DIAGNOSIS — Z923 Personal history of irradiation: Secondary | ICD-10-CM | POA: Diagnosis not present

## 2019-09-10 DIAGNOSIS — C3490 Malignant neoplasm of unspecified part of unspecified bronchus or lung: Secondary | ICD-10-CM

## 2019-09-10 DIAGNOSIS — Z5112 Encounter for antineoplastic immunotherapy: Secondary | ICD-10-CM | POA: Diagnosis not present

## 2019-09-10 DIAGNOSIS — R110 Nausea: Secondary | ICD-10-CM | POA: Diagnosis not present

## 2019-09-10 DIAGNOSIS — Z79899 Other long term (current) drug therapy: Secondary | ICD-10-CM | POA: Insufficient documentation

## 2019-09-10 DIAGNOSIS — T451X5A Adverse effect of antineoplastic and immunosuppressive drugs, initial encounter: Secondary | ICD-10-CM | POA: Diagnosis not present

## 2019-09-10 DIAGNOSIS — R0602 Shortness of breath: Secondary | ICD-10-CM | POA: Diagnosis not present

## 2019-09-10 DIAGNOSIS — Z5111 Encounter for antineoplastic chemotherapy: Secondary | ICD-10-CM | POA: Diagnosis not present

## 2019-09-10 LAB — CMP (CANCER CENTER ONLY)
ALT: 8 U/L (ref 0–44)
AST: 15 U/L (ref 15–41)
Albumin: 3.8 g/dL (ref 3.5–5.0)
Alkaline Phosphatase: 95 U/L (ref 38–126)
Anion gap: 9 (ref 5–15)
BUN: 8 mg/dL (ref 8–23)
CO2: 27 mmol/L (ref 22–32)
Calcium: 9.5 mg/dL (ref 8.9–10.3)
Chloride: 98 mmol/L (ref 98–111)
Creatinine: 0.71 mg/dL (ref 0.61–1.24)
GFR, Est AFR Am: >60 mL/min (ref >60)
GFR, Estimated: >60 mL/min (ref >60)
Glucose, Bld: 119 mg/dL — ABNORMAL HIGH (ref 70–99)
Potassium: 3.6 mmol/L (ref 3.5–5.1)
Sodium: 134 mmol/L — ABNORMAL LOW (ref 135–145)
Total Bilirubin: 0.5 mg/dL (ref 0.3–1.2)
Total Protein: 6.5 g/dL (ref 6.5–8.1)

## 2019-09-10 LAB — CBC WITH DIFFERENTIAL (CANCER CENTER ONLY)
Abs Immature Granulocytes: 1.41 10*3/uL — ABNORMAL HIGH (ref 0.00–0.07)
Basophils Absolute: 0.1 10*3/uL (ref 0.0–0.1)
Basophils Relative: 1 %
Eosinophils Absolute: 0 10*3/uL (ref 0.0–0.5)
Eosinophils Relative: 0 %
HCT: 28.9 % — ABNORMAL LOW (ref 39.0–52.0)
Hemoglobin: 9.4 g/dL — ABNORMAL LOW (ref 13.0–17.0)
Immature Granulocytes: 10 %
Lymphocytes Relative: 16 %
Lymphs Abs: 2.2 10*3/uL (ref 0.7–4.0)
MCH: 31.5 pg (ref 26.0–34.0)
MCHC: 32.5 g/dL (ref 30.0–36.0)
MCV: 97 fL (ref 80.0–100.0)
Monocytes Absolute: 1.7 10*3/uL — ABNORMAL HIGH (ref 0.1–1.0)
Monocytes Relative: 13 %
Neutro Abs: 8.2 10*3/uL — ABNORMAL HIGH (ref 1.7–7.7)
Neutrophils Relative %: 60 %
Platelet Count: 464 10*3/uL — ABNORMAL HIGH (ref 150–400)
RBC: 2.98 MIL/uL — ABNORMAL LOW (ref 4.22–5.81)
RDW: 18.2 % — ABNORMAL HIGH (ref 11.5–15.5)
WBC Count: 13.6 10*3/uL — ABNORMAL HIGH (ref 4.0–10.5)
nRBC: 0.1 % (ref 0.0–0.2)

## 2019-09-10 LAB — FERRITIN: Ferritin: 2445 ng/mL — ABNORMAL HIGH (ref 24–336)

## 2019-09-10 LAB — SAMPLE TO BLOOD BANK

## 2019-09-10 LAB — IRON AND TIBC
Iron: 41 ug/dL — ABNORMAL LOW (ref 42–163)
Saturation Ratios: 17 % — ABNORMAL LOW (ref 20–55)
TIBC: 233 ug/dL (ref 202–409)
UIBC: 192 ug/dL (ref 117–376)

## 2019-09-10 LAB — TSH: TSH: 1.168 u[IU]/mL (ref 0.320–4.118)

## 2019-09-10 LAB — LACTATE DEHYDROGENASE: LDH: 321 U/L — ABNORMAL HIGH (ref 98–192)

## 2019-09-10 MED ORDER — SODIUM CHLORIDE 0.9% FLUSH
10.0000 mL | INTRAVENOUS | Status: DC | PRN
  Administered 2019-09-10: 10 mL
  Filled 2019-09-10: qty 10

## 2019-09-10 MED ORDER — SODIUM CHLORIDE 0.9 % IV SOLN
90.0000 mg/m2 | Freq: Once | INTRAVENOUS | Status: AC
  Administered 2019-09-10: 160 mg via INTRAVENOUS
  Filled 2019-09-10: qty 8

## 2019-09-10 MED ORDER — PALONOSETRON HCL INJECTION 0.25 MG/5ML
0.2500 mg | Freq: Once | INTRAVENOUS | Status: AC
  Administered 2019-09-10: 0.25 mg via INTRAVENOUS

## 2019-09-10 MED ORDER — SODIUM CHLORIDE 0.9 % IV SOLN
510.0000 mg | Freq: Once | INTRAVENOUS | Status: AC
  Administered 2019-09-10: 510 mg via INTRAVENOUS
  Filled 2019-09-10: qty 510

## 2019-09-10 MED ORDER — SODIUM CHLORIDE 0.9 % IV SOLN
10.0000 mg | Freq: Once | INTRAVENOUS | Status: AC
  Administered 2019-09-10: 10 mg via INTRAVENOUS
  Filled 2019-09-10: qty 10

## 2019-09-10 MED ORDER — SODIUM CHLORIDE 0.9 % IV SOLN
Freq: Once | INTRAVENOUS | Status: AC
  Filled 2019-09-10: qty 250

## 2019-09-10 MED ORDER — SODIUM CHLORIDE 0.9 % IV SOLN
441.0000 mg | Freq: Once | INTRAVENOUS | Status: AC
  Administered 2019-09-10: 440 mg via INTRAVENOUS
  Filled 2019-09-10: qty 44

## 2019-09-10 MED ORDER — SODIUM CHLORIDE 0.9 % IV SOLN
1200.0000 mg | Freq: Once | INTRAVENOUS | Status: AC
  Administered 2019-09-10: 1200 mg via INTRAVENOUS
  Filled 2019-09-10: qty 20

## 2019-09-10 MED ORDER — PALONOSETRON HCL INJECTION 0.25 MG/5ML
INTRAVENOUS | Status: AC
  Filled 2019-09-10: qty 5

## 2019-09-10 MED ORDER — DRONABINOL 5 MG PO CAPS: 5.0000 mg | ORAL_CAPSULE | Freq: Two times a day (BID) | ORAL | 2 refills | Status: AC

## 2019-09-10 MED ORDER — HEPARIN SOD (PORK) LOCK FLUSH 100 UNIT/ML IV SOLN
500.0000 [IU] | Freq: Once | INTRAVENOUS | Status: AC | PRN
  Administered 2019-09-10: 500 [IU]
  Filled 2019-09-10: qty 5

## 2019-09-10 MED ORDER — SODIUM CHLORIDE 0.9 % IV SOLN
150.0000 mg | Freq: Once | INTRAVENOUS | Status: AC
  Administered 2019-09-10: 150 mg via INTRAVENOUS
  Filled 2019-09-10: qty 5

## 2019-09-10 MED FILL — DRONABINOL 5 MG CAPSULE: 5 | 30 days supply | Qty: 60 | Fill #0

## 2019-09-10 NOTE — Patient Instructions (Signed)
[facility] Discharge Instructions for Patients Receiving Chemotherapy  Today you received the following chemotherapy agents Carboplatin, Etoposide and Tecentriq.  To help prevent nausea and vomiting after your treatment, we encourage you to take your nausea medication as directed - BUT NO ZOFRAN FOR 3 DAYS.  Ferumoxytol injection What is this medicine? FERUMOXYTOL is an iron complex. Iron is used to make healthy red blood cells, which carry oxygen and nutrients throughout the body. This medicine is used to treat iron deficiency anemia. This medicine may be used for other purposes; ask your health care provider or pharmacist if you have questions. COMMON BRAND NAME(S): Feraheme What should I tell my health care provider before I take this medicine? They need to know if you have any of these conditions: anemia not caused by low iron levels high levels of iron in the blood magnetic resonance imaging (MRI) test scheduled an unusual or allergic reaction to iron, other medicines, foods, dyes, or preservatives pregnant or trying to get pregnant breast-feeding How should I use this medicine? This medicine is for injection into a vein. It is given by a health care professional in a hospital or clinic setting. Talk to your pediatrician regarding the use of this medicine in children. Special care may be needed. Overdosage: If you think you have taken too much of this medicine contact a poison control center or emergency room at once. NOTE: This medicine is only for you. Do not share this medicine with others. What if I miss a dose? It is important not to miss your dose. Call your doctor or health care professional if you are unable to keep an appointment. What may interact with this medicine? This medicine may interact with the following medications: other iron products This list may not describe all possible interactions. Give your health care provider a list of all the medicines,  herbs, non-prescription drugs, or dietary supplements you use. Also tell them if you smoke, drink alcohol, or use illegal drugs. Some items may interact with your medicine. What should I watch for while using this medicine? Visit your doctor or healthcare professional regularly. Tell your doctor or healthcare professional if your symptoms do not start to get better or if they get worse. You may need blood work done while you are taking this medicine. You may need to follow a special diet. Talk to your doctor. Foods that contain iron include: whole grains/cereals, dried fruits, beans, or peas, leafy green vegetables, and organ meats (liver, kidney). What side effects may I notice from receiving this medicine? Side effects that you should report to your doctor or health care professional as soon as possible: allergic reactions like skin rash, itching or hives, swelling of the face, lips, or tongue breathing problems changes in blood pressure feeling faint or lightheaded, falls fever or chills flushing, sweating, or hot feelings swelling of the ankles or feet Side effects that usually do not require medical attention (report to your doctor or health care professional if they continue or are bothersome): diarrhea headache nausea, vomiting stomach pain This list may not describe all possible side effects. Call your doctor for medical advice about side effects. You may report side effects to FDA at 1-800-FDA-1088. Where should I keep my medicine? This drug is given in a hospital or clinic and will not be stored at home. NOTE: This sheet is a summary. It may not cover all possible information. If you have questions about this medicine, talk to your doctor, pharmacist, or health  care provider.  2020 Elsevier/Gold Standard (2016-07-11 20:21:10) If you develop nausea and vomiting that is not controlled by your nausea medication, call the clinic.   BELOW ARE SYMPTOMS THAT SHOULD BE REPORTED  IMMEDIATELY:  *FEVER GREATER THAN 100.5 F  *CHILLS WITH OR WITHOUT FEVER  NAUSEA AND VOMITING THAT IS NOT CONTROLLED WITH YOUR NAUSEA MEDICATION  *UNUSUAL SHORTNESS OF BREATH  *UNUSUAL BRUISING OR BLEEDING  TENDERNESS IN MOUTH AND THROAT WITH OR WITHOUT PRESENCE OF ULCERS  *URINARY PROBLEMS  *BOWEL PROBLEMS  UNUSUAL RASH Items with * indicate a potential emergency and should be followed up as soon as possible.  Feel free to call the clinic you have any questions or concerns. The clinic phone number is [phone].  Please show the [facility] at check-in to the Emergency Department and triage nurse.

## 2019-09-10 NOTE — Progress Notes (Signed)
Hematology and Oncology Follow Up Visit  Walter Hernandez [id number] Nov 22, 1950 70 y.o. 09/10/2019   Principle Diagnosis:  Stage IB ([id number]) superficial spreading melanoma of the left lower Hernandez  Small Cell Lung Cancer -- Extensive Stage -- lung/liver/brain mets  Past Therapy: Cranial XRT -- 06/19/2019 thru 07/02/2019  Current Therapy:        Carboplatin/VP-16/Tecentriq -- started on 07/07/2018, s/p cycle 3   Interim History:  Walter Hernandez is here today for follow-up and treatment. He is doing fairly well and states that his energy seems a bit better. He has been able to walk some for exercise.  Hgb today is down to 9.4 (previously 12 after blood). Platelets 464, WBC count 13.6.  Iron saturation was low earlier this month.  He denies any episodes of bleeding. No petechiae noted.  He states that he does not have an appetite but is drinking 2-3 Boost or Ensure a day and eat yogurt in between. His weight is down 4 lbs since his last visit.  He occasionally notes SOB with over exertion after walking a great distance and will stop to rest if needed.  No fever, chills, n/v, cough, rash, dizziness, chest pain, palpitations, abdominal pain or changes in bowel or bladder habits.  No swelling, tenderness, numbness or tingling in his extremities.  No falls or syncopal episodes to report.   ECOG Performance Status: 1 - Symptomatic but completely ambulatory  Medications:  Allergies as of 09/10/2019   No Known Allergies     Medication List       Accurate as of September 10, 2019  9:28 AM. If you have any questions, ask your nurse or doctor.        acetaminophen 500 MG tablet Commonly known as: TYLENOL Take 1,000 mg by mouth every 6 (six) hours as needed for moderate pain.   ALPRAZolam 0.5 MG tablet Commonly known as: XANAX Take 1 tablet (0.5 mg total) by mouth every 6 (six) hours as needed for anxiety.   doxycycline 100 MG tablet Commonly known as: VIBRA-TABS Take 1 tablet (100 mg total) by  mouth 2 (two) times daily.   famciclovir 500 MG tablet Commonly known as: FAMVIR Take 500 mg by mouth every morning.   finasteride 5 MG tablet Commonly known as: Proscar Take 1 tablet (5 mg total) by mouth daily.   furosemide 20 MG tablet Commonly known as: LASIX Take 2 tablets (40 mg total) by mouth daily.   IBUPROFEN & ACETAMINOPHEN PO Take 1-2 tablets by mouth every 6 (six) hours as needed (pain).   lidocaine-prilocaine cream Commonly known as: EMLA Apply to affected area once   LORazepam 0.5 MG tablet Commonly known as: ATIVAN 1 tablet po 30 minutes prior to radiation or MRI   LORazepam 0.5 MG tablet Commonly known as: Ativan Take 1 tablet (0.5 mg total) by mouth every 6 (six) hours as needed (Nausea or vomiting).   memantine 5 MG tablet Commonly known as: Namenda Starting 06/18/19 Take one tab po daily. Starting 06/25/19 Take one tab po q am and one tab po q pm. Starting 1/26/21Take two tabs po q am, and one tab po q pm. Starting 07/09/19 Take two tabs po q am and two tabs po q pm until 12/03/19.   multivitamin capsule Take 1 capsule by mouth daily.   nicotine 21 mg/24hr patch Commonly known as: NICODERM CQ - dosed in mg/24 hours PLACE 1 PATCH (21 MG TOTAL) ONTO THE SKIN DAILY.   nystatin 100000 UNIT/ML suspension Commonly known  as: MYCOSTATIN Take 5 mLs (500,000 Units total) by mouth 4 (four) times daily.   omeprazole 40 MG capsule Commonly known as: PRILOSEC Take 40 mg by mouth daily.   ondansetron 4 MG tablet Commonly known as: ZOFRAN Take 1 tablet (4 mg total) by mouth every 6 (six) hours as needed for nausea.   ondansetron 8 MG tablet Commonly known as: Zofran Take 1 tablet (8 mg total) by mouth 2 (two) times daily as needed for refractory nausea / vomiting. Start on day 3 after carboplatin chemo.   OPCON-A OP Place 2 drops into both eyes as needed (for dry eyes).   potassium chloride SA 20 MEQ tablet Commonly known as: KLOR-CON Take 1 tablet (20 mEq  total) by mouth daily.   prochlorperazine 10 MG tablet Commonly known as: COMPAZINE Take 1 tablet (10 mg total) by mouth every 6 (six) hours as needed (Nausea or vomiting).   sucralfate 1 g tablet Commonly known as: CARAFATE TAKE 1 TABLET (1 G TOTAL) BY MOUTH 4 (FOUR) TIMES DAILY. DISSOLVE EACH TABLET IN 15 CC WATER.   tamsulosin 0.4 MG Caps capsule Commonly known as: FLOMAX Take 2 capsules (0.8 mg total) by mouth daily. What changed:   how much to take  when to take this       Allergies: No Known Allergies  Past Medical History, Surgical history, Social history, and Family History were reviewed and updated.  Review of Systems: All other 10 point review of systems is negative.   Physical Exam:  vitals were not taken for this visit.   Wt Readings from Last 3 Encounters:  09/10/19 145 lb 12 oz (66.1 kg)  08/20/19 149 lb (67.6 kg)  07/29/19 161 lb 6.4 oz (73.2 kg)    Ocular: Sclerae unicteric, pupils equal, round and reactive to light Ear-nose-throat: Oropharynx clear, dentition fair Lymphatic: No cervical or supraclavicular adenopathy Lungs no rales or rhonchi, good excursion bilaterally Heart regular rate and rhythm, no murmur appreciated Abd soft, nontender, positive bowel sounds, no liver or spleen tip palpated on exam, no fluid wave  MSK no focal spinal tenderness, no joint edema Neuro: non-focal, well-oriented, appropriate affect Breasts: Deferred   Lab Results  Component Value Date   WBC 13.6 (H) 09/10/2019   HGB 9.4 (L) 09/10/2019   HCT 28.9 (L) 09/10/2019   MCV 97.0 09/10/2019   PLT 464 (H) 09/10/2019   Lab Results  Component Value Date   FERRITIN 4,817 (H) 08/07/2019   IRON 33 (L) 08/07/2019   TIBC 200 (L) 08/07/2019   UIBC 167 08/07/2019   IRONPCTSAT 16 (L) 08/07/2019   Lab Results  Component Value Date   RETICCTPCT 1.5 08/07/2019   RBC 2.98 (L) 09/10/2019   No results found for: KPAFRELGTCHN, LAMBDASER, KAPLAMBRATIO No results found for:  IGGSERUM, IGA, IGMSERUM No results found for: Kathrynn Ducking, MSPIKE, SPEI   Chemistry      Component Value Date/Time   NA 135 08/20/2019 1002   NA 142 01/30/2017 0804   K 4.4 08/20/2019 1002   K 4.1 01/30/2017 0804   CL 95 (L) 08/20/2019 1002   CL 105 01/25/2016 0926   CO2 30 08/20/2019 1002   CO2 29 01/30/2017 0804   BUN 10 08/20/2019 1002   BUN 6.9 (L) 01/30/2017 0804   CREATININE 0.86 08/20/2019 1002   CREATININE 0.8 01/30/2017 0804      Component Value Date/Time   CALCIUM 9.9 08/20/2019 1002   CALCIUM 10.5 (H) 01/30/2017 0804  ALKPHOS 81 08/20/2019 1002   ALKPHOS 79 01/30/2017 0804   AST 21 08/20/2019 1002   AST 34 01/30/2017 0804   ALT 12 08/20/2019 1002   ALT 18 01/30/2017 0804   BILITOT 0.6 08/20/2019 1002   BILITOT 1.49 (H) 01/30/2017 0804       Impression and Plan: Walter Hernandez is a very pleasant 69 yo caucasian Walter Hernandez. He now has extensive stage small cell lung cancer with brain metastasis.  He completed cranial radiation therapy for this and is doing well on chemotherapy. His PET scan last month showed a nice response.  We will proceed with cycle 4 as planned and add IV iron today as well.  We will also have him try Marinol 5 mg PO BID and see if this doesn't help his appetite. We discussed potential side effects and he will let us know if he has any issues.  We will plan to see him again in 3 weeks.  He is in agreement and will contact our office with any questions or concerns. We can certainly see him sooner if needed.   Laverna Peace, NP 4/6/20219:28 AM

## 2019-09-10 NOTE — Progress Notes (Signed)
Ok to treat with HR 103 per Judson Roch NP

## 2019-09-11 ENCOUNTER — Inpatient Hospital Stay: Payer: Medicare Other

## 2019-09-11 VITALS — BP 143/87 | HR 103 | Temp 98.2°F | Resp 17

## 2019-09-11 DIAGNOSIS — C3431 Malignant neoplasm of lower lobe, right bronchus or lung: Secondary | ICD-10-CM | POA: Diagnosis not present

## 2019-09-11 DIAGNOSIS — Z5111 Encounter for antineoplastic chemotherapy: Secondary | ICD-10-CM | POA: Diagnosis not present

## 2019-09-11 DIAGNOSIS — Z5189 Encounter for other specified aftercare: Secondary | ICD-10-CM | POA: Diagnosis not present

## 2019-09-11 DIAGNOSIS — C4372 Malignant melanoma of left lower limb, including hip: Secondary | ICD-10-CM | POA: Diagnosis not present

## 2019-09-11 DIAGNOSIS — Z5112 Encounter for antineoplastic immunotherapy: Secondary | ICD-10-CM | POA: Diagnosis not present

## 2019-09-11 DIAGNOSIS — C787 Secondary malignant neoplasm of liver and intrahepatic bile duct: Secondary | ICD-10-CM | POA: Diagnosis not present

## 2019-09-11 DIAGNOSIS — C3490 Malignant neoplasm of unspecified part of unspecified bronchus or lung: Secondary | ICD-10-CM

## 2019-09-11 MED ORDER — SODIUM CHLORIDE 0.9 % IV SOLN
Freq: Once | INTRAVENOUS | Status: AC
  Filled 2019-09-11: qty 250

## 2019-09-11 MED ORDER — SODIUM CHLORIDE 0.9% FLUSH
10.0000 mL | INTRAVENOUS | Status: DC | PRN
  Administered 2019-09-11: 10 mL
  Filled 2019-09-11: qty 10

## 2019-09-11 MED ORDER — SODIUM CHLORIDE 0.9 % IV SOLN
90.0000 mg/m2 | Freq: Once | INTRAVENOUS | Status: AC
  Administered 2019-09-11: 160 mg via INTRAVENOUS
  Filled 2019-09-11: qty 8

## 2019-09-11 MED ORDER — HEPARIN SOD (PORK) LOCK FLUSH 100 UNIT/ML IV SOLN
500.0000 [IU] | Freq: Once | INTRAVENOUS | Status: AC | PRN
  Administered 2019-09-11: 500 [IU]
  Filled 2019-09-11: qty 5

## 2019-09-11 MED ORDER — HEPARIN SOD (PORK) LOCK FLUSH 100 UNIT/ML IV SOLN
250.0000 [IU] | Freq: Once | INTRAVENOUS | Status: DC | PRN
  Filled 2019-09-11: qty 5

## 2019-09-11 MED ORDER — SODIUM CHLORIDE 0.9 % IV SOLN
10.0000 mg | Freq: Once | INTRAVENOUS | Status: AC
  Administered 2019-09-11: 10 mg via INTRAVENOUS
  Filled 2019-09-11: qty 10

## 2019-09-12 ENCOUNTER — Other Ambulatory Visit: Payer: Self-pay | Admitting: Hematology & Oncology

## 2019-09-12 ENCOUNTER — Other Ambulatory Visit: Payer: Self-pay | Admitting: *Deleted

## 2019-09-12 ENCOUNTER — Other Ambulatory Visit: Payer: Self-pay

## 2019-09-12 ENCOUNTER — Inpatient Hospital Stay: Payer: Medicare Other

## 2019-09-12 VITALS — BP 125/79 | HR 68 | Temp 98.0°F | Resp 18

## 2019-09-12 DIAGNOSIS — C3490 Malignant neoplasm of unspecified part of unspecified bronchus or lung: Secondary | ICD-10-CM

## 2019-09-12 DIAGNOSIS — Z5189 Encounter for other specified aftercare: Secondary | ICD-10-CM | POA: Diagnosis not present

## 2019-09-12 DIAGNOSIS — Z5111 Encounter for antineoplastic chemotherapy: Secondary | ICD-10-CM | POA: Diagnosis not present

## 2019-09-12 DIAGNOSIS — Z5112 Encounter for antineoplastic immunotherapy: Secondary | ICD-10-CM | POA: Diagnosis not present

## 2019-09-12 DIAGNOSIS — C4372 Malignant melanoma of left lower limb, including hip: Secondary | ICD-10-CM | POA: Diagnosis not present

## 2019-09-12 DIAGNOSIS — C787 Secondary malignant neoplasm of liver and intrahepatic bile duct: Secondary | ICD-10-CM | POA: Diagnosis not present

## 2019-09-12 DIAGNOSIS — C3431 Malignant neoplasm of lower lobe, right bronchus or lung: Secondary | ICD-10-CM | POA: Diagnosis not present

## 2019-09-12 MED ORDER — SODIUM CHLORIDE 0.9 % IV SOLN
10.0000 mg | Freq: Once | INTRAVENOUS | Status: AC
  Administered 2019-09-12: 12:00:00 10 mg via INTRAVENOUS
  Filled 2019-09-12: qty 10

## 2019-09-12 MED ORDER — HEPARIN SOD (PORK) LOCK FLUSH 100 UNIT/ML IV SOLN
500.0000 [IU] | Freq: Once | INTRAVENOUS | Status: AC | PRN
  Administered 2019-09-12: 14:00:00 500 [IU]
  Filled 2019-09-12: qty 5

## 2019-09-12 MED ORDER — SODIUM CHLORIDE 0.9% FLUSH
10.0000 mL | INTRAVENOUS | Status: DC | PRN
  Administered 2019-09-12: 10 mL
  Filled 2019-09-12: qty 10

## 2019-09-12 MED ORDER — SODIUM CHLORIDE 0.9 % IV SOLN
Freq: Once | INTRAVENOUS | Status: AC
  Filled 2019-09-12: qty 250

## 2019-09-12 MED ORDER — SODIUM CHLORIDE 0.9 % IV SOLN
90.0000 mg/m2 | Freq: Once | INTRAVENOUS | Status: AC
  Administered 2019-09-12: 13:00:00 160 mg via INTRAVENOUS
  Filled 2019-09-12: qty 8

## 2019-09-12 NOTE — Patient Instructions (Signed)
Etoposide, VP-16 injection What is this medicine? ETOPOSIDE, VP-16 (e toe POE side) is a chemotherapy drug. It is used to treat testicular cancer, lung cancer, and other cancers. This medicine may be used for other purposes; ask your health care provider or pharmacist if you have questions. COMMON BRAND NAME(S): Etopophos, Toposar, VePesid What should I tell my health care provider before I take this medicine? They need to know if you have any of these conditions:  infection  kidney disease  liver disease  low blood counts, like low white cell, platelet, or red cell counts  an unusual or allergic reaction to etoposide, other medicines, foods, dyes, or preservatives  pregnant or trying to get pregnant  breast-feeding How should I use this medicine? This medicine is for infusion into a vein. It is administered in a hospital or clinic by a specially trained health care professional. Talk to your pediatrician regarding the use of this medicine in children. Special care may be needed. Overdosage: If you think you have taken too much of this medicine contact a poison control center or emergency room at once. NOTE: This medicine is only for you. Do not share this medicine with others. What if I miss a dose? It is important not to miss your dose. Call your doctor or health care professional if you are unable to keep an appointment. What may interact with this medicine? This medicine may interact with the following medications:  warfarin This list may not describe all possible interactions. Give your health care provider a list of all the medicines, herbs, non-prescription drugs, or dietary supplements you use. Also tell them if you smoke, drink alcohol, or use illegal drugs. Some items may interact with your medicine. What should I watch for while using this medicine? Visit your doctor for checks on your progress. This drug may make you feel generally unwell. This is not uncommon, as  chemotherapy can affect healthy cells as well as cancer cells. Report any side effects. Continue your course of treatment even though you feel ill unless your doctor tells you to stop. In some cases, you may be given additional medicines to help with side effects. Follow all directions for their use. Call your doctor or health care professional for advice if you get a fever, chills or sore throat, or other symptoms of a cold or flu. Do not treat yourself. This drug decreases your body's ability to fight infections. Try to avoid being around people who are sick. This medicine may increase your risk to bruise or bleed. Call your doctor or health care professional if you notice any unusual bleeding. Talk to your doctor about your risk of cancer. You may be more at risk for certain types of cancers if you take this medicine. Do not become pregnant while taking this medicine or for at least 6 months after stopping it. Women should inform their doctor if they wish to become pregnant or think they might be pregnant. Women of child-bearing potential will need to have a negative pregnancy test before starting this medicine. There is a potential for serious side effects to an unborn child. Talk to your health care professional or pharmacist for more information. Do not breast-feed an infant while taking this medicine. Men must use a latex condom during sexual contact with a woman while taking this medicine and for at least 4 months after stopping it. A latex condom is needed even if you have had a vasectomy. Contact your doctor right away if your partner  becomes pregnant. Do not donate sperm while taking this medicine and for at least 4 months after you stop taking this medicine. Men should inform their doctors if they wish to father a child. This medicine may lower sperm counts. What side effects may I notice from receiving this medicine? Side effects that you should report to your doctor or health care professional  as soon as possible:  allergic reactions like skin rash, itching or hives, swelling of the face, lips, or tongue  low blood counts - this medicine may decrease the number of white blood cells, red blood cells, and platelets. You may be at increased risk for infections and bleeding  nausea, vomiting  redness, blistering, peeling or loosening of the skin, including inside the mouth  signs and symptoms of infection like fever; chills; cough; sore throat; pain or trouble passing urine  signs and symptoms of low red blood cells or anemia such as unusually weak or tired; feeling faint or lightheaded; falls; breathing problems  unusual bruising or bleeding Side effects that usually do not require medical attention (report to your doctor or health care professional if they continue or are bothersome):  changes in taste  diarrhea  hair loss  loss of appetite  mouth sores This list may not describe all possible side effects. Call your doctor for medical advice about side effects. You may report side effects to FDA at 1-800-FDA-1088. Where should I keep my medicine? This drug is given in a hospital or clinic and will not be stored at home. NOTE: This sheet is a summary. It may not cover all possible information. If you have questions about this medicine, talk to your doctor, pharmacist, or health care provider.  2020 Elsevier/Gold Standard (2018-07-18 16:57:15)

## 2019-09-13 ENCOUNTER — Inpatient Hospital Stay: Payer: Medicare Other

## 2019-09-13 ENCOUNTER — Other Ambulatory Visit: Payer: Self-pay | Admitting: *Deleted

## 2019-09-13 VITALS — BP 103/68 | HR 109 | Temp 97.8°F | Resp 18

## 2019-09-13 DIAGNOSIS — Z5112 Encounter for antineoplastic immunotherapy: Secondary | ICD-10-CM | POA: Diagnosis not present

## 2019-09-13 DIAGNOSIS — C787 Secondary malignant neoplasm of liver and intrahepatic bile duct: Secondary | ICD-10-CM | POA: Diagnosis not present

## 2019-09-13 DIAGNOSIS — Z5111 Encounter for antineoplastic chemotherapy: Secondary | ICD-10-CM | POA: Diagnosis not present

## 2019-09-13 DIAGNOSIS — C4372 Malignant melanoma of left lower limb, including hip: Secondary | ICD-10-CM | POA: Diagnosis not present

## 2019-09-13 DIAGNOSIS — C3431 Malignant neoplasm of lower lobe, right bronchus or lung: Secondary | ICD-10-CM | POA: Diagnosis not present

## 2019-09-13 DIAGNOSIS — Z5189 Encounter for other specified aftercare: Secondary | ICD-10-CM | POA: Diagnosis not present

## 2019-09-13 DIAGNOSIS — C3490 Malignant neoplasm of unspecified part of unspecified bronchus or lung: Secondary | ICD-10-CM

## 2019-09-13 MED ORDER — PEGFILGRASTIM-JMDB 6 MG/0.6ML ~~LOC~~ SOSY
6.0000 mg | PREFILLED_SYRINGE | Freq: Once | SUBCUTANEOUS | Status: AC
  Administered 2019-09-13: 6 mg via SUBCUTANEOUS

## 2019-09-13 MED ORDER — PEGFILGRASTIM-JMDB 6 MG/0.6ML ~~LOC~~ SOSY
PREFILLED_SYRINGE | SUBCUTANEOUS | Status: AC
  Filled 2019-09-13: qty 0.6

## 2019-09-16 ENCOUNTER — Other Ambulatory Visit: Payer: Self-pay | Admitting: Radiation Therapy

## 2019-09-17 ENCOUNTER — Other Ambulatory Visit: Payer: Self-pay | Admitting: Hematology & Oncology

## 2019-09-18 DIAGNOSIS — C7900 Secondary malignant neoplasm of unspecified kidney and renal pelvis: Secondary | ICD-10-CM | POA: Diagnosis not present

## 2019-09-18 DIAGNOSIS — C3490 Malignant neoplasm of unspecified part of unspecified bronchus or lung: Secondary | ICD-10-CM | POA: Diagnosis not present

## 2019-09-18 DIAGNOSIS — I10 Essential (primary) hypertension: Secondary | ICD-10-CM | POA: Diagnosis not present

## 2019-09-24 NOTE — Progress Notes (Signed)
Pharmacist Chemotherapy Monitoring - Follow Up Assessment    I verify that I have reviewed each item in the below checklist:  . Regimen for the patient is scheduled for the appropriate day and plan matches scheduled date. Marland Kitchen Appropriate non-routine labs are ordered dependent on drug ordered. . If applicable, additional medications reviewed and ordered per protocol based on lifetime cumulative doses and/or treatment regimen.   Plan for follow-up and/or issues identified: Yes . I-vent associated with next due treatment: Yes . MD and/or nursing notified: Yes  Walter Hernandez, Walter Hernandez 09/24/2019 3:05 PM

## 2019-09-28 DIAGNOSIS — Z23 Encounter for immunization: Secondary | ICD-10-CM | POA: Diagnosis not present

## 2019-10-01 ENCOUNTER — Inpatient Hospital Stay: Payer: Medicare Other

## 2019-10-01 ENCOUNTER — Telehealth: Payer: Self-pay | Admitting: Hematology & Oncology

## 2019-10-01 ENCOUNTER — Other Ambulatory Visit: Payer: Self-pay | Admitting: Urology

## 2019-10-01 ENCOUNTER — Inpatient Hospital Stay (HOSPITAL_BASED_OUTPATIENT_CLINIC_OR_DEPARTMENT_OTHER): Payer: Medicare Other | Admitting: Hematology & Oncology

## 2019-10-01 ENCOUNTER — Ambulatory Visit: Payer: Medicare Other

## 2019-10-01 ENCOUNTER — Encounter: Payer: Self-pay | Admitting: Hematology & Oncology

## 2019-10-01 ENCOUNTER — Other Ambulatory Visit: Payer: Self-pay

## 2019-10-01 VITALS — Wt 144.0 lb

## 2019-10-01 DIAGNOSIS — C3431 Malignant neoplasm of lower lobe, right bronchus or lung: Secondary | ICD-10-CM | POA: Diagnosis not present

## 2019-10-01 DIAGNOSIS — D509 Iron deficiency anemia, unspecified: Secondary | ICD-10-CM

## 2019-10-01 DIAGNOSIS — C3490 Malignant neoplasm of unspecified part of unspecified bronchus or lung: Secondary | ICD-10-CM | POA: Diagnosis not present

## 2019-10-01 DIAGNOSIS — C787 Secondary malignant neoplasm of liver and intrahepatic bile duct: Secondary | ICD-10-CM | POA: Diagnosis not present

## 2019-10-01 DIAGNOSIS — C4372 Malignant melanoma of left lower limb, including hip: Secondary | ICD-10-CM | POA: Diagnosis not present

## 2019-10-01 DIAGNOSIS — Z5189 Encounter for other specified aftercare: Secondary | ICD-10-CM | POA: Diagnosis not present

## 2019-10-01 DIAGNOSIS — R634 Abnormal weight loss: Secondary | ICD-10-CM

## 2019-10-01 DIAGNOSIS — Z5112 Encounter for antineoplastic immunotherapy: Secondary | ICD-10-CM | POA: Diagnosis not present

## 2019-10-01 DIAGNOSIS — R110 Nausea: Secondary | ICD-10-CM

## 2019-10-01 DIAGNOSIS — Z5111 Encounter for antineoplastic chemotherapy: Secondary | ICD-10-CM | POA: Diagnosis not present

## 2019-10-01 LAB — CMP (CANCER CENTER ONLY)
ALT: 8 U/L (ref 0–44)
AST: 17 U/L (ref 15–41)
Albumin: 3.9 g/dL (ref 3.5–5.0)
Alkaline Phosphatase: 105 U/L (ref 38–126)
Anion gap: 9 (ref 5–15)
BUN: 7 mg/dL — ABNORMAL LOW (ref 8–23)
CO2: 27 mmol/L (ref 22–32)
Calcium: 9.3 mg/dL (ref 8.9–10.3)
Chloride: 100 mmol/L (ref 98–111)
Creatinine: 0.65 mg/dL (ref 0.61–1.24)
GFR, Est AFR Am: >60 mL/min (ref >60)
GFR, Estimated: >60 mL/min (ref >60)
Glucose, Bld: 128 mg/dL — ABNORMAL HIGH (ref 70–99)
Potassium: 3.7 mmol/L (ref 3.5–5.1)
Sodium: 136 mmol/L (ref 135–145)
Total Bilirubin: 0.3 mg/dL (ref 0.3–1.2)
Total Protein: 6.5 g/dL (ref 6.5–8.1)

## 2019-10-01 LAB — CBC WITH DIFFERENTIAL (CANCER CENTER ONLY)
Abs Immature Granulocytes: 1.39 10*3/uL — ABNORMAL HIGH (ref 0.00–0.07)
Basophils Absolute: 0.1 10*3/uL (ref 0.0–0.1)
Basophils Relative: 1 %
Eosinophils Absolute: 0 10*3/uL (ref 0.0–0.5)
Eosinophils Relative: 0 %
HCT: 29.1 % — ABNORMAL LOW (ref 39.0–52.0)
Hemoglobin: 9.5 g/dL — ABNORMAL LOW (ref 13.0–17.0)
Immature Granulocytes: 9 %
Lymphocytes Relative: 15 %
Lymphs Abs: 2.3 10*3/uL (ref 0.7–4.0)
MCH: 32.1 pg (ref 26.0–34.0)
MCHC: 32.6 g/dL (ref 30.0–36.0)
MCV: 98.3 fL (ref 80.0–100.0)
Monocytes Absolute: 2.4 10*3/uL — ABNORMAL HIGH (ref 0.1–1.0)
Monocytes Relative: 16 %
Neutro Abs: 8.9 10*3/uL — ABNORMAL HIGH (ref 1.7–7.7)
Neutrophils Relative %: 59 %
Platelet Count: 354 10*3/uL (ref 150–400)
RBC: 2.96 MIL/uL — ABNORMAL LOW (ref 4.22–5.81)
RDW: 18.8 % — ABNORMAL HIGH (ref 11.5–15.5)
WBC Count: 15.1 10*3/uL — ABNORMAL HIGH (ref 4.0–10.5)
nRBC: 0.1 % (ref 0.0–0.2)

## 2019-10-01 LAB — LACTATE DEHYDROGENASE: LDH: 279 U/L — ABNORMAL HIGH (ref 98–192)

## 2019-10-01 LAB — SAMPLE TO BLOOD BANK

## 2019-10-01 MED ORDER — HEPARIN SOD (PORK) LOCK FLUSH 100 UNIT/ML IV SOLN
500.0000 [IU] | Freq: Once | INTRAVENOUS | Status: AC | PRN
  Administered 2019-10-01: 500 [IU]
  Filled 2019-10-01: qty 5

## 2019-10-01 MED ORDER — SODIUM CHLORIDE 0.9 % IV SOLN
Freq: Once | INTRAVENOUS | Status: AC
  Filled 2019-10-01: qty 250

## 2019-10-01 MED ORDER — LORAZEPAM 0.5 MG PO TABS: ORAL_TABLET | ORAL | 0 refills | Status: DC

## 2019-10-01 MED ORDER — SODIUM CHLORIDE 0.9% FLUSH
10.0000 mL | INTRAVENOUS | Status: DC | PRN
  Administered 2019-10-01: 10 mL
  Filled 2019-10-01: qty 10

## 2019-10-01 MED ORDER — SODIUM CHLORIDE 0.9 % IV SOLN
1200.0000 mg | Freq: Once | INTRAVENOUS | Status: AC
  Administered 2019-10-01: 1200 mg via INTRAVENOUS
  Filled 2019-10-01: qty 20

## 2019-10-01 NOTE — Patient Instructions (Signed)
Implanted Port Insertion, Care After °This sheet gives you information about how to care for yourself after your procedure. Your health care provider may also give you more specific instructions. If you have problems or questions, contact your health care provider. °What can I expect after the procedure? °After the procedure, it is common to have: °· Discomfort at the port insertion site. °· Bruising on the skin over the port. This should improve over 3-4 days. °Follow these instructions at home: °Port care °· After your port is placed, you will get a manufacturer's information card. The card has information about your port. Keep this card with you at all times. °· Take care of the port as told by your health care provider. Ask your health care provider if you or a family member can get training for taking care of the port at home. A home health care nurse may also take care of the port. °· Make sure to remember what type of port you have. °Incision care ° °  ° °· Follow instructions from your health care provider about how to take care of your port insertion site. Make sure you: °? Wash your hands with soap and water before and after you change your bandage (dressing). If soap and water are not available, use hand sanitizer. °? Change your dressing as told by your health care provider. °? Leave stitches (sutures), skin glue, or adhesive strips in place. These skin closures may need to stay in place for 2 weeks or longer. If adhesive strip edges start to loosen and curl up, you may trim the loose edges. Do not remove adhesive strips completely unless your health care provider tells you to do that. °· Check your port insertion site every day for signs of infection. Check for: °? Redness, swelling, or pain. °? Fluid or blood. °? Warmth. °? Pus or a bad smell. °Activity °· Return to your normal activities as told by your health care provider. Ask your health care provider what activities are safe for you. °· Do not  lift anything that is heavier than 10 lb (4.5 kg), or the limit that you are told, until your health care provider says that it is safe. °General instructions °· Take over-the-counter and prescription medicines only as told by your health care provider. °· Do not take baths, swim, or use a hot tub until your health care provider approves. Ask your health care provider if you may take showers. You may only be allowed to take sponge baths. °· Do not drive for 24 hours if you were given a sedative during your procedure. °· Wear a medical alert bracelet in case of an emergency. This will tell any health care providers that you have a port. °· Keep all follow-up visits as told by your health care provider. This is important. °Contact a health care provider if: °· You cannot flush your port with saline as directed, or you cannot draw blood from the port. °· You have a fever or chills. °· You have redness, swelling, or pain around your port insertion site. °· You have fluid or blood coming from your port insertion site. °· Your port insertion site feels warm to the touch. °· You have pus or a bad smell coming from the port insertion site. °Get help right away if: °· You have chest pain or shortness of breath. °· You have bleeding from your port that you cannot control. °Summary °· Take care of the port as told by your health   care provider. Keep the manufacturer's information card with you at all times. °· Change your dressing as told by your health care provider. °· Contact a health care provider if you have a fever or chills or if you have redness, swelling, or pain around your port insertion site. °· Keep all follow-up visits as told by your health care provider. °This information is not intended to replace advice given to you by your health care provider. Make sure you discuss any questions you have with your health care provider. °Document Revised: 12/19/2017 Document Reviewed: 12/19/2017 °Elsevier Patient Education ©  2020 [employer] ° °

## 2019-10-01 NOTE — Progress Notes (Signed)
Hematology and Oncology Follow Up Visit  Walter Hernandez [id number] Apr 30, 1951 69 y.o. 10/01/2019   Principle Diagnosis:  Stage IB ([id number]) superficial spreading melanoma of the left lower leg  Small Cell Lung Cancer -- Extensive Stage -- lung/liver/brain mets  Past Therapy: Cranial XRT -- 06/19/2019 thru 07/02/2019  Current Therapy:        Carboplatin/VP-16/Tecentriq -- started on 07/07/2018, s/p cycle #4 Tecentriq - maintenance -- cycle#1 -- start on 10/01/2019   Interim History:  Walter Hernandez is here today for follow-up and treatment.  He now will be on just Tecentriq.  He has had 4 cycles of chemotherapy with carboplatinum and etoposide.  We will go ahead and do a PET scan on him in May.  The real problem is that he is having some dysphagia.  He says when he eats solids he gets stuck in his esophagus.  I am not sure why this is.  We will have to get him to gastroenterology and see if they can do a upper endoscopy on him to find out why he is having problems.  Otherwise he is doing fairly well.  He is taking 2 Ensure a day.  I told him that he has to have 4 or 5 Ensure a day.  We really have to get his weight up.  I think once his weight comes up, his blood count will get better.  He has had no problems with bleeding.  There is been no diarrhea.  He has had no cough or shortness of breath.  Since he stopped smoking, he says he is not coughing.  He has had no leg swelling.  There is been no rashes.  He has had no headache.  Overall, his performance status is ECOG 1.    Medications:  Allergies as of 10/01/2019   No Known Allergies     Medication List       Accurate as of October 01, 2019 11:14 AM. If you have any questions, ask your nurse or doctor.        acetaminophen 500 MG tablet Commonly known as: TYLENOL Take 1,000 mg by mouth every 6 (six) hours as needed for moderate pain.   ALPRAZolam 0.5 MG tablet Commonly known as: XANAX Take 1 tablet (0.5 mg total) by mouth  every 6 (six) hours as needed for anxiety.   doxycycline 100 MG tablet Commonly known as: VIBRA-TABS Take 1 tablet (100 mg total) by mouth 2 (two) times daily.   dronabinol 5 MG capsule Commonly known as: MARINOL Take 1 capsule (5 mg total) by mouth 2 (two) times daily before a meal.   famciclovir 500 MG tablet Commonly known as: FAMVIR Take 500 mg by mouth every morning.   finasteride 5 MG tablet Commonly known as: PROSCAR TAKE 1 TABLET BY MOUTH EVERY DAY   furosemide 20 MG tablet Commonly known as: LASIX Take 2 tablets (40 mg total) by mouth daily.   IBUPROFEN & ACETAMINOPHEN PO Take 1-2 tablets by mouth every 6 (six) hours as needed (pain).   lidocaine-prilocaine cream Commonly known as: EMLA Apply to affected area once   LORazepam 0.5 MG tablet Commonly known as: ATIVAN 1 tablet po 30 minutes prior to radiation or MRI   LORazepam 0.5 MG tablet Commonly known as: Ativan Take 1 tablet (0.5 mg total) by mouth every 6 (six) hours as needed (Nausea or vomiting).   memantine 5 MG tablet Commonly known as: Namenda Starting 06/18/19 Take one tab po daily. Starting 06/25/19 Take one tab  po q am and one tab po q pm. Starting 1/26/21Take two tabs po q am, and one tab po q pm. Starting 07/09/19 Take two tabs po q am and two tabs po q pm until 12/03/19.   multivitamin capsule Take 1 capsule by mouth daily.   nicotine 21 mg/24hr patch Commonly known as: NICODERM CQ - dosed in mg/24 hours PLACE 1 PATCH (21 MG TOTAL) ONTO THE SKIN DAILY.   nystatin 100000 UNIT/ML suspension Commonly known as: MYCOSTATIN Take 5 mLs (500,000 Units total) by mouth 4 (four) times daily.   omeprazole 40 MG capsule Commonly known as: PRILOSEC Take 40 mg by mouth daily.   ondansetron 4 MG tablet Commonly known as: ZOFRAN Take 1 tablet (4 mg total) by mouth every 6 (six) hours as needed for nausea.   ondansetron 8 MG tablet Commonly known as: Zofran Take 1 tablet (8 mg total) by mouth 2 (two)  times daily as needed for refractory nausea / vomiting. Start on day 3 after carboplatin chemo.   OPCON-A OP Place 2 drops into both eyes as needed (for dry eyes).   potassium chloride SA 20 MEQ tablet Commonly known as: KLOR-CON Take 1 tablet (20 mEq total) by mouth daily.   prochlorperazine 10 MG tablet Commonly known as: COMPAZINE Take 1 tablet (10 mg total) by mouth every 6 (six) hours as needed (Nausea or vomiting).   sucralfate 1 g tablet Commonly known as: CARAFATE TAKE 1 TABLET (1 G TOTAL) BY MOUTH 4 (FOUR) TIMES DAILY. DISSOLVE EACH TABLET IN 15 CC WATER.   tamsulosin 0.4 MG Caps capsule Commonly known as: FLOMAX Take 2 capsules (0.8 mg total) by mouth daily. What changed:   how much to take  when to take this       Allergies: No Known Allergies  Past Medical History, Surgical history, Social history, and Family History were reviewed and updated.  Review of Systems: Review of Systems  Constitutional: Positive for weight loss.  HENT: Negative.   Eyes: Negative.   Respiratory: Negative.   Cardiovascular: Negative.   Gastrointestinal: Negative.   Genitourinary: Negative.   Musculoskeletal: Negative.   Skin: Negative.   Neurological: Negative.   Endo/Heme/Allergies: Negative.   Psychiatric/Behavioral: Negative.      Physical Exam:  weight is 144 lb (65.3 kg).   Wt Readings from Last 3 Encounters:  10/01/19 144 lb (65.3 kg)  09/10/19 145 lb 1.9 oz (65.8 kg)  09/10/19 145 lb 12 oz (66.1 kg)    Physical Exam Vitals reviewed.  HENT:     Head: Normocephalic and atraumatic.  Eyes:     Pupils: Pupils are equal, round, and reactive to light.  Cardiovascular:     Rate and Rhythm: Normal rate and regular rhythm.     Heart sounds: Normal heart sounds.  Pulmonary:     Effort: Pulmonary effort is normal.     Breath sounds: Normal breath sounds.  Abdominal:     General: Bowel sounds are normal.     Palpations: Abdomen is soft.  Musculoskeletal:         General: No tenderness or deformity. Normal range of motion.     Cervical back: Normal range of motion.  Lymphadenopathy:     Cervical: No cervical adenopathy.  Skin:    General: Skin is warm and dry.     Findings: No erythema or rash.  Neurological:     Mental Status: He is alert and oriented to person, place, and time.  Psychiatric:  Behavior: Behavior normal.        Thought Content: Thought content normal.        Judgment: Judgment normal.      Lab Results  Component Value Date   WBC 15.1 (H) 10/01/2019   HGB 9.5 (L) 10/01/2019   HCT 29.1 (L) 10/01/2019   MCV 98.3 10/01/2019   PLT 354 10/01/2019   Lab Results  Component Value Date   FERRITIN 2,445 (H) 09/10/2019   IRON 41 (L) 09/10/2019   TIBC 233 09/10/2019   UIBC 192 09/10/2019   IRONPCTSAT 17 (L) 09/10/2019   Lab Results  Component Value Date   RETICCTPCT 1.5 08/07/2019   RBC 2.96 (L) 10/01/2019   No results found for: KPAFRELGTCHN, LAMBDASER, KAPLAMBRATIO No results found for: IGGSERUM, IGA, IGMSERUM No results found for: Kathrynn Ducking, MSPIKE, SPEI   Chemistry      Component Value Date/Time   NA 134 (L) 09/10/2019 0920   NA 142 01/30/2017 0804   K 3.6 09/10/2019 0920   K 4.1 01/30/2017 0804   CL 98 09/10/2019 0920   CL 105 01/25/2016 0926   CO2 27 09/10/2019 0920   CO2 29 01/30/2017 0804   BUN 8 09/10/2019 0920   BUN 6.9 (L) 01/30/2017 0804   CREATININE 0.71 09/10/2019 0920   CREATININE 0.8 01/30/2017 0804      Component Value Date/Time   CALCIUM 9.5 09/10/2019 0920   CALCIUM 10.5 (H) 01/30/2017 0804   ALKPHOS 95 09/10/2019 0920   ALKPHOS 79 01/30/2017 0804   AST 15 09/10/2019 0920   AST 34 01/30/2017 0804   ALT 8 09/10/2019 0920   ALT 18 01/30/2017 0804   BILITOT 0.5 09/10/2019 0920   BILITOT 1.49 (H) 01/30/2017 0804       Impression and Plan: Walter Hernandez is a very pleasant 69 yo caucasian gentleman with history of stage 1b melanoma of the  left lower leg.   This really is not a problem right now.  The real problem is that he has the extensive stage small cell lung cancer.  He has had 4 cycles of chemotherapy along with immunotherapy.  We will now go with just immunotherapy.  He says he has a brain scan scheduled for this Friday.  I would assume that radiation oncology has ordered this to see how the brain looks after his radiation therapy.  We will go ahead with a PET scan showing see how everything looks systemically.  Hopefully, he will continue to respond with just immunotherapy.  I will speak with gastroenterology today.  At we really need to have this dysphagia looked at.  He needs an upper endoscopy.  He may have a stricture.  I will plan to get him back in 3 more weeks.    Volanda Napoleon, MD 4/27/202111:14 AM

## 2019-10-01 NOTE — Telephone Encounter (Signed)
Appointments were already scheduled per 4/27 los

## 2019-10-01 NOTE — Patient Instructions (Signed)
Atezolizumab injection What is this medicine? ATEZOLIZUMAB (a te zoe LIZ ue mab) is a monoclonal antibody. It is used to treat bladder cancer (urothelial cancer), liver cancer, lung cancer, breast cancer, and melanoma. This medicine may be used for other purposes; ask your health care provider or pharmacist if you have questions. COMMON BRAND NAME(S): Tecentriq What should I tell my health care provider before I take this medicine? They need to know if you have any of these conditions:  diabetes  immune system problems  infection  inflammatory bowel disease  liver disease  lung or breathing disease  lupus  nervous system problems like myasthenia gravis or Guillain-Barre syndrome  organ transplant  an unusual or allergic reaction to atezolizumab, other medicines, foods, dyes, or preservatives  pregnant or trying to get pregnant  breast-feeding How should I use this medicine? This medicine is for infusion into a vein. It is given by a health care professional in a hospital or clinic setting. A special MedGuide will be given to you before each treatment. Be sure to read this information carefully each time. Talk to your pediatrician regarding the use of this medicine in children. Special care may be needed. Overdosage: If you think you have taken too much of this medicine contact a poison control center or emergency room at once. NOTE: This medicine is only for you. Do not share this medicine with others. What if I miss a dose? It is important not to miss your dose. Call your doctor or health care professional if you are unable to keep an appointment. What may interact with this medicine? Interactions have not been studied. This list may not describe all possible interactions. Give your health care provider a list of all the medicines, herbs, non-prescription drugs, or dietary supplements you use. Also tell them if you smoke, drink alcohol, or use illegal drugs. Some items may  interact with your medicine. What should I watch for while using this medicine? Your condition will be monitored carefully while you are receiving this medicine. You may need blood work done while you are taking this medicine. Do not become pregnant while taking this medicine or for at least 5 months after stopping it. Women should inform their doctor if they wish to become pregnant or think they might be pregnant. There is a potential for serious side effects to an unborn child. Talk to your health care professional or pharmacist for more information. Do not breast-feed an infant while taking this medicine or for at least 5 months after the last dose. What side effects may I notice from receiving this medicine? Side effects that you should report to your doctor or health care professional as soon as possible:  allergic reactions like skin rash, itching or hives, swelling of the face, lips, or tongue  black, tarry stools  bloody or watery diarrhea  breathing problems  changes in vision  chest pain or chest tightness  chills  facial flushing  fever  headache  signs and symptoms of high blood sugar such as dizziness; dry mouth; dry skin; fruity breath; nausea; stomach pain; increased hunger or thirst; increased urination  signs and symptoms of liver injury like dark yellow or brown urine; general ill feeling or flu-like symptoms; light-colored stools; loss of appetite; nausea; right upper belly pain; unusually weak or tired; yellowing of the eyes or skin  stomach pain  trouble passing urine or change in the amount of urine Side effects that usually do not require medical attention (report to  your doctor or health care professional if they continue or are bothersome):  bone pain  cough  diarrhea  joint pain  muscle pain  muscle weakness  swelling of arms or legs  tiredness  weight loss This list may not describe all possible side effects. Call your doctor for  medical advice about side effects. You may report side effects to FDA at 1-800-FDA-1088. Where should I keep my medicine? This drug is given in a hospital or clinic and will not be stored at home. NOTE: This sheet is a summary. It may not cover all possible information. If you have questions about this medicine, talk to your doctor, pharmacist, or health care provider.  2020 Elsevier/Gold Standard (2019-01-11 13:11:14)

## 2019-10-02 ENCOUNTER — Telehealth: Payer: Self-pay | Admitting: Radiation Oncology

## 2019-10-02 ENCOUNTER — Encounter: Payer: Self-pay | Admitting: *Deleted

## 2019-10-02 NOTE — Progress Notes (Signed)
Patient seen yesterday. He will need a PET scan scheduled prior to his next appointment.   PET Scan scheduled for 10/17/19 at [facility]. Wife, Colletta Maryland, called and given appointment including time, date and location. Needed preparation for PET also given including 7:30a arrival time, NPO status and carb intake. Written instruction also mailed to the home.

## 2019-10-02 NOTE — Telephone Encounter (Signed)
That is fine.  This visit is already scheduled as a virtual follow up and was planning to do by phone as we did with the 1 month follow up but if they are able and prefer to do a MyChart visit, that is fine with me too. Thank you! -Meliya Mcconahy

## 2019-10-02 NOTE — Telephone Encounter (Signed)
Phoned Jacki back as directed by [employer], PA-C. Colletta Maryland reports that her husband has never used any other medication than Ativan for these scans. Colletta Maryland reports she has already picked up the script that Ashlyn sent in. She verbalized their intent to take 0.5 mg before leaving home and 0.5 mg upon arrival to the hospital for the MRI. Colletta Maryland states, "we have several pills because Dr. Marin Olp has prescribed it to [place] in the past." Explained this RN would report their intentions to the provider.   Also, Colletta Maryland request the office visit on 5/5 to review the MRI be changed to a mychart video encounter "so we don't have to come all the way up to the cancer center." Explained this RN would route this request to the provider and our scheduler. Colletta Maryland reports having MyChart but isn't sure how to use the video encounter.

## 2019-10-02 NOTE — Telephone Encounter (Signed)
Received call from patient's wife, Colletta Maryland. She explains her husband is scheduled for an MRI on Friday at 3 pm. She reports Ativan doesn't help relieve his anxiety related to the MRI. She is requesting a different medication be prescribed. She understands this RN will contact Ashlyn Bruning, PA-C about her request.

## 2019-10-03 ENCOUNTER — Other Ambulatory Visit: Payer: Self-pay

## 2019-10-03 ENCOUNTER — Ambulatory Visit (INDEPENDENT_AMBULATORY_CARE_PROVIDER_SITE_OTHER): Payer: Medicare Other | Admitting: Gastroenterology

## 2019-10-03 ENCOUNTER — Encounter: Payer: Self-pay | Admitting: Gastroenterology

## 2019-10-03 VITALS — BP 104/68 | HR 110 | Temp 98.2°F | Ht 67.0 in | Wt 144.1 lb

## 2019-10-03 DIAGNOSIS — R1310 Dysphagia, unspecified: Secondary | ICD-10-CM | POA: Diagnosis not present

## 2019-10-03 DIAGNOSIS — R1319 Other dysphagia: Secondary | ICD-10-CM

## 2019-10-03 DIAGNOSIS — K219 Gastro-esophageal reflux disease without esophagitis: Secondary | ICD-10-CM

## 2019-10-03 MED ORDER — OMEPRAZOLE 40 MG PO CPDR: 40.0000 mg | DELAYED_RELEASE_CAPSULE | Freq: Every day | ORAL | 11 refills | Status: DC

## 2019-10-03 MED ORDER — OMEPRAZOLE 40 MG PO CPDR: 40.0000 mg | DELAYED_RELEASE_CAPSULE | Freq: Two times a day (BID) | ORAL | 3 refills | Status: DC

## 2019-10-03 NOTE — Progress Notes (Signed)
Chief Complaint:   Referring Provider:  Lavone Orn, MD      ASSESSMENT AND PLAN;   #1. GERD with H/O HH, now with eso dysphagia. D/d includes eso stricture, Schatzki's ring, motility disorder, EoE, pill induced esophagitis, r/o esophageal carcinoma/extrinsic lesions or Achalasia.  #2.  Small cell lung cancer lung (lung/liver/brain)-doing well, s/p cranial XRT, on carboplatin/VP-16/Tecentriq. ECOG performance status 1.  Plan: -Omeprazole 40 mg p.o. QD, 1/2 hr before breakfast. #60.  11 refills. -Ba swallow with Ba tablet as well. -EGD with eso bx and possibly dil.  Discussed risks and benefits including small but definite risks of eso perforation, bleeding, risks of anesthesia.  The benefits were also discussed. Consent forms given. -I have instructed patient to chew foods especially meats and breads well and eat slowly. -He does meet criteria for LEC.  Still will run in by [employer].      HPI:    Walter Hernandez is a 69 y.o. male  With history of extensive stage small cell lung cancer with brain mets with responded well to treatment.  His PET scan last month showed good response. Working at request of Dr. Marin Olp   C/O Dysphagia  X 3-4 months -mostly solids and pills, mid chest.  Has not been eating very well.  No history of radiation to the chest.  Has lost 10 lb.  On review PET/CT 08/12/2019 does show mediastinal and right hilar lymphadenopathy which has improved.  Had a EGD in 2012 by Dr. Earlean Shawl which showed a large hiatal hernia.  Also had colonoscopy at the same time which was negative.  No nausea, vomiting, heartburn.  No odynophagia.  No significant diarrhea or constipation.  No melena or hematochezia. No abdominal pain.  No jaundice dark urine or pale stools.  He does have mild anemia with hemoglobin 9.4, MCV 97, platelets 464.  Iron studies consistent with anemia of chronic disease.  Reticulocyte count 1.5.  Attributed to chemo. Past Medical History:  Diagnosis Date    . Cancer [facility])    recently discovered melanoma  . GERD (gastroesophageal reflux disease)   . Goals of care, counseling/discussion 06/19/2019  . History of hiatal hernia   . Hypertension    recently placed on new blood pressure med  . Malignant melanoma of skin of left lower leg ([place]) 07/28/2015    Past Surgical History:  Procedure Laterality Date  . BRONCHIAL NEEDLE ASPIRATION BIOPSY  06/05/2019   Procedure: Bronchial Needle Aspiration Biopsies;  Surgeon: Candee Furbish, MD;  Location: [place];  Service: Thoracic;;  . CARDIAC CATHETERIZATION     15 yrs ago...came out normal   -- hiatal hernia  . COLONOSCOPY  2012   Dr Earlean Shawl   . ENDOBRONCHIAL ULTRASOUND N/A 06/11/2019   Procedure: ENDOBRONCHIAL ULTRASOUND;  Surgeon: Garner Nash, DO;  Location: [place];  Service: Endoscopy;  Laterality: N/A;  . ESOPHAGOGASTRODUODENOSCOPY  2012   Dr Earlean Shawl. has a really bad hiatal hernia   . FINE NEEDLE ASPIRATION  06/11/2019   Procedure: FINE NEEDLE ASPIRATION (FNA) LINEAR;  Surgeon: Garner Nash, DO;  Location: [place];  Service: Endoscopy;;  . IR IMAGING GUIDED PORT INSERTION  07/03/2019  . MELANOMA EXCISION WITH SENTINEL LYMPH NODE BIOPSY Left 09/29/2014   Procedure: WIDE EXCISION MELANOMA LEFT CALF WITH LEFT INGUINAL  SENTINEL LYMPH NODE BIOPSY;  Surgeon: Georganna Skeans, MD;  Location: [place];  Service: General;  Laterality: Left;  . skin lesion excised     non cancerous  .  VIDEO BRONCHOSCOPY N/A 06/11/2019   Procedure: VIDEO BRONCHOSCOPY WITHOUT FLUORO;  Surgeon: Garner Nash, DO;  Location: [place];  Service: Endoscopy;  Laterality: N/A;  . VIDEO BRONCHOSCOPY WITH ENDOBRONCHIAL ULTRASOUND N/A 06/05/2019   Procedure: VIDEO BRONCHOSCOPY WITH ENDOBRONCHIAL ULTRASOUND;  Surgeon: Candee Furbish, MD;  Location: [facility] OR;  Service: Thoracic;  Laterality: N/A;    Family History  Problem Relation Age of Onset  . Colon cancer Neg Hx   . Esophageal cancer Neg Hx     Social History    Tobacco Use  . Smoking status: Former Smoker    Packs/day: 1.00    Years: 50.00    Pack years: 50.00    Types: Cigarettes  . Smokeless tobacco: Never Used  . Tobacco comment: quit  06/03/2019  Substance Use Topics  . Alcohol use: Not Currently    Alcohol/week: 0.0 standard drinks    Comment: socially....beer  wine & liquor  . Drug use: No    Current Outpatient Medications  Medication Sig Dispense Refill  . acetaminophen (TYLENOL) 500 MG tablet Take 1,000 mg by mouth every 6 (six) hours as needed for moderate pain.    Marland Kitchen ALPRAZolam (XANAX) 0.5 MG tablet Take 1 tablet (0.5 mg total) by mouth every 6 (six) hours as needed for anxiety. 60 tablet 0  . finasteride (PROSCAR) 5 MG tablet TAKE 1 TABLET BY MOUTH EVERY DAY 90 tablet 1  . memantine (NAMENDA) 5 MG tablet Starting 06/18/19 Take one tab po daily. Starting 06/25/19 Take one tab po q am and one tab po q pm. Starting 1/26/21Take two tabs po q am, and one tab po q pm. Starting 07/09/19 Take two tabs po q am and two tabs po q pm until 12/03/19. 602 tablet 0  . Multiple Vitamin (MULTIVITAMIN) capsule Take 1 capsule by mouth daily.    . nicotine (NICODERM CQ - DOSED IN MG/24 HOURS) 21 mg/24hr patch PLACE 1 PATCH (21 MG TOTAL) ONTO THE SKIN DAILY. 28 patch 2  . omeprazole (PRILOSEC) 40 MG capsule Take 40 mg by mouth daily.    . tamsulosin (FLOMAX) 0.4 MG CAPS capsule Take 2 capsules (0.8 mg total) by mouth daily. (Patient taking differently: Take 0.4 mg by mouth 2 (two) times daily. ) 60 capsule 1  . dronabinol (MARINOL) 5 MG capsule Take 1 capsule (5 mg total) by mouth 2 (two) times daily before a meal. (Patient not taking: Reported on 10/03/2019) 60 capsule 2  . furosemide (LASIX) 20 MG tablet Take 2 tablets (40 mg total) by mouth daily. (Patient not taking: Reported on 10/03/2019) 30 tablet 5  . lidocaine-prilocaine (EMLA) cream Apply to affected area once (Patient not taking: Reported on 10/03/2019) 30 g 3  . LORazepam (ATIVAN) 0.5 MG tablet  Take 1 tablet (0.5 mg total) by mouth every 6 (six) hours as needed (Nausea or vomiting). (Patient not taking: Reported on 10/03/2019) 30 tablet 0  . Naphazoline-Pheniramine (OPCON-A OP) Place 2 drops into both eyes as needed (for dry eyes).    . ondansetron (ZOFRAN) 4 MG tablet Take 1 tablet (4 mg total) by mouth every 6 (six) hours as needed for nausea. (Patient not taking: Reported on 10/03/2019) 20 tablet 0  . ondansetron (ZOFRAN) 8 MG tablet Take 1 tablet (8 mg total) by mouth 2 (two) times daily as needed for refractory nausea / vomiting. Start on day 3 after carboplatin chemo. (Patient not taking: Reported on 10/03/2019) 30 tablet 1  . potassium chloride SA (KLOR-CON) 20 MEQ  tablet Take 1 tablet (20 mEq total) by mouth daily. (Patient not taking: Reported on 10/03/2019) 30 tablet 4  . prochlorperazine (COMPAZINE) 10 MG tablet Take 1 tablet (10 mg total) by mouth every 6 (six) hours as needed (Nausea or vomiting). (Patient not taking: Reported on 10/03/2019) 30 tablet 1   No current facility-administered medications for this visit.    No Known Allergies  Review of Systems:  Constitutional: Denies fever, chills, diaphoresis, appetite change and fatigue.  HEENT: Denies photophobia, eye pain, redness, hearing loss, ear pain, congestion, sore throat, rhinorrhea, sneezing, mouth sores, neck pain, neck stiffness and tinnitus.   Respiratory: Denies SOB, DOE, cough, chest tightness,  and wheezing.   Cardiovascular: Denies chest pain, palpitations and leg swelling.  Genitourinary: Denies dysuria, urgency, frequency, hematuria, flank pain and difficulty urinating.  Musculoskeletal: Denies myalgias, back pain, joint swelling, arthralgias and gait problem.  Skin: No rash.  Neurological: Denies dizziness, seizures, syncope, weakness, light-headedness, numbness and headaches.  Hematological: Denies adenopathy. Easy bruising, personal or family bleeding history  Psychiatric/Behavioral: No anxiety or  depression     Physical Exam:    BP 104/68   Pulse (!) 110   Temp 98.2 F (36.8 C)   Ht 5\' 7"  (1.702 m)   Wt 144 lb 2 oz (65.4 kg)   BMI 22.57 kg/m  Wt Readings from Last 3 Encounters:  10/03/19 144 lb 2 oz (65.4 kg)  10/01/19 144 lb (65.3 kg)  09/10/19 145 lb 1.9 oz (65.8 kg)   Constitutional: Thin built in no acute distress. Psychiatric: Normal mood and affect. Behavior is normal. HEENT: Pupils normal.  Conjunctivae are normal. No scleral icterus. Neck supple.  Cardiovascular: Normal rate, regular rhythm. No edema Pulmonary/chest: Effort normal and breath sounds normal. No wheezing, rales or rhonchi. Abdominal: Soft, nondistended. Nontender. Bowel sounds active throughout. There are no masses palpable. No hepatomegaly. Rectal:  defered Neurological: Alert and oriented to person place and time. Skin: Skin is warm and dry. No rashes noted.  Data Reviewed: I have personally reviewed following labs and imaging studies  CBC: CBC Latest Ref Rng & Units 10/01/2019 09/10/2019 08/20/2019  WBC 4.0 - 10.5 K/uL 15.1(H) 13.6(H) 21.9(H)  Hemoglobin 13.0 - 17.0 g/dL 9.5(L) 9.4(L) 12.0(L)  Hematocrit 39.0 - 52.0 % 29.1(L) 28.9(L) 36.1(L)  Platelets 150 - 400 K/uL 354 464(H) 537(H)    CMP: CMP Latest Ref Rng & Units 10/01/2019 09/10/2019 08/20/2019  Glucose 70 - 99 mg/dL 128(H) 119(H) 106(H)  BUN 8 - 23 mg/dL 7(L) 8 10  Creatinine 0.61 - 1.24 mg/dL 0.65 0.71 0.86  Sodium 135 - 145 mmol/L 136 134(L) 135  Potassium 3.5 - 5.1 mmol/L 3.7 3.6 4.4  Chloride 98 - 111 mmol/L 100 98 95(L)  CO2 22 - 32 mmol/L 27 27 30   Calcium 8.9 - 10.3 mg/dL 9.3 9.5 9.9  Total Protein 6.5 - 8.1 g/dL 6.5 6.5 7.1  Total Bilirubin 0.3 - 1.2 mg/dL 0.3 0.5 0.6  Alkaline Phos 38 - 126 U/L 105 95 81  AST 15 - 41 U/L 17 15 21   ALT 0 - 44 U/L 8 8 12     GFR: Estimated Creatinine Clearance: 79.5 mL/min (by C-G formula based on SCr of 0.65 mg/dL). Liver Function Tests: Recent Labs  Lab 10/01/19 1030  AST 17  ALT  8  ALKPHOS 105  BILITOT 0.3  PROT 6.5  ALBUMIN 3.9   Discussed with patient's family.  CT scans were reviewed.  Carmell Austria, MD 10/03/2019, 9:04 AM  Cc:  Lavone Orn, MD

## 2019-10-03 NOTE — Patient Instructions (Signed)
If you are age 71 or older, your body mass index should be between 23-30. Your Body mass index is 22.57 kg/m. If this is out of the aforementioned range listed, please consider follow up with your Primary Care Provider.  If you are age 81 or younger, your body mass index should be between 19-25. Your Body mass index is 22.57 kg/m. If this is out of the aformentioned range listed, please consider follow up with your Primary Care Provider.    You have been scheduled for a Barium Esophogram at [facility] (1st floor of the hospital) on 10/15/19 at 11am. Please arrive 15 minutes prior to your appointment for registration. Make certain not to have anything to eat or drink 3 hours prior to your test. If you need to reschedule for any reason, please contact radiology at [phone] to do so. __________________________________________________________________ A barium swallow is an examination that concentrates on views of the esophagus. This tends to be a double contrast exam (barium and two liquids which, when combined, create a gas to distend the wall of the oesophagus) or single contrast (non-ionic iodine based). The study is usually tailored to your symptoms so a good history is essential. Attention is paid during the study to the form, structure and configuration of the esophagus, looking for functional disorders (such as aspiration, dysphagia, achalasia, motility and reflux) EXAMINATION You may be asked to change into a gown, depending on the type of swallow being performed. A radiologist and radiographer will perform the procedure. The radiologist will advise you of the type of contrast selected for your procedure and direct you during the exam. You will be asked to stand, sit or lie in several different positions and to hold a small amount of fluid in your mouth before being asked to swallow while the imaging is performed .In some instances you may be asked to swallow barium coated marshmallows  to assess the motility of a solid food bolus. The exam can be recorded as a digital or video fluoroscopy procedure. POST PROCEDURE It will take 1-2 days for the barium to pass through your system. To facilitate this, it is important, unless otherwise directed, to increase your fluids for the next 24-48hrs and to resume your normal diet.  This test typically takes about 30 minutes to perform. __________________________________________________________________________________  We have sent the following medications to your pharmacy for you to pick up at your convenience: Omeprazole   You have been scheduled for an endoscopy. Please follow written instructions given to you at your visit today. If you use inhalers (even only as needed), please bring them with you on the day of your procedure. Your physician has requested that you go to www.[email or web address].com and enter the access code given to you at your visit today. This web site gives a general overview about your procedure. However, you should still follow specific instructions given to you by our office regarding your preparation for the procedure.  Thank you,  Dr. Jackquline Denmark

## 2019-10-03 NOTE — Addendum Note (Signed)
Addended by: Karena Addison on: 10/03/2019 11:42 AM   Modules accepted: Orders

## 2019-10-04 ENCOUNTER — Ambulatory Visit (HOSPITAL_COMMUNITY)
Admission: RE | Admit: 2019-10-04 | Discharge: 2019-10-04 | Disposition: A | Discharge status: Home / Self Care | Payer: Medicare Other | Source: Ambulatory Visit | Attending: Radiation Oncology | Admitting: Radiation Oncology

## 2019-10-04 DIAGNOSIS — Z85118 Personal history of other malignant neoplasm of bronchus and lung: Secondary | ICD-10-CM | POA: Diagnosis not present

## 2019-10-04 DIAGNOSIS — Z85820 Personal history of malignant melanoma of skin: Secondary | ICD-10-CM | POA: Diagnosis not present

## 2019-10-04 DIAGNOSIS — C7931 Secondary malignant neoplasm of brain: Secondary | ICD-10-CM

## 2019-10-04 IMAGING — MR MR HEAD WO/W CM
9 of 12 series · 25 of 48 positions shown · IV contrast (gadavist)
Comparison: MRI head [DATE]

CLINICAL DATA: Metastatic disease to brain. History of small cell
lung cancer and melanoma.

EXAM:
MRI HEAD WITHOUT AND WITH CONTRAST
TECHNIQUE: Multiplanar, multiecho pulse sequences of the brain and surrounding
structures were obtained without and with intravenous contrast.
CONTRAST:  6mL GADAVIST GADOBUTROL 1 MMOL/ML IV SOLN

[Series 2: FLAIR · sagittal · 3.0mm · 0.51mm/px · 2 of 55 slices shown (1 of 2)]
[im 1/55]
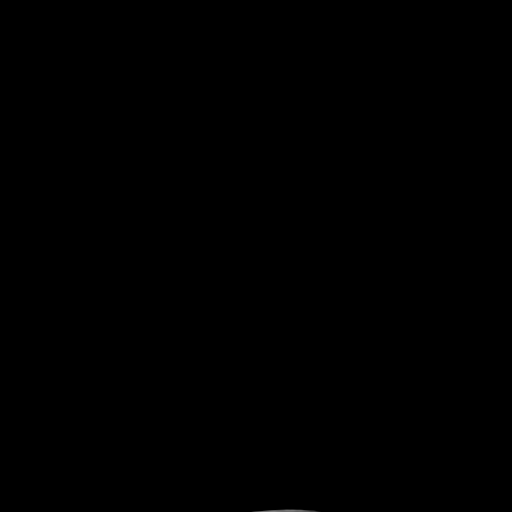
[im 55/55]
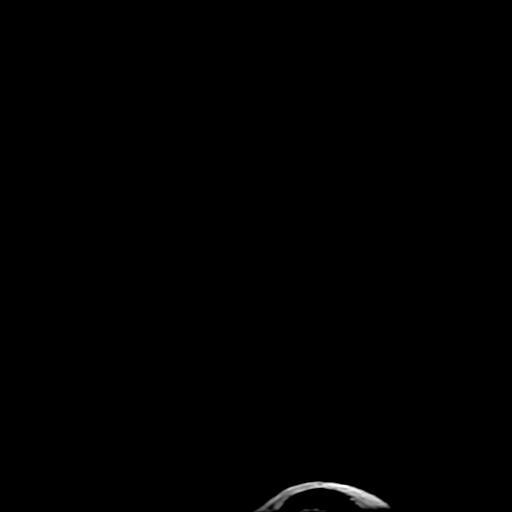

[Series 3: DWI · axial · 3.0mm · 1.09mm/px · z∈[-16,+166]mm · 5 of 128 slices shown]
[im 1/128]
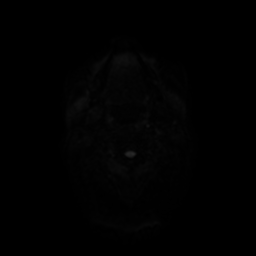
[im 32/128]
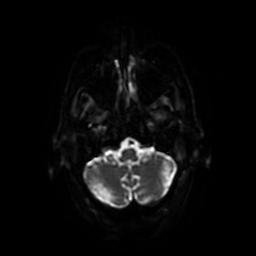
[im 64/128]
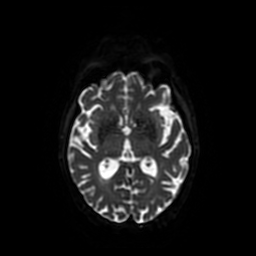
[im 96/128]
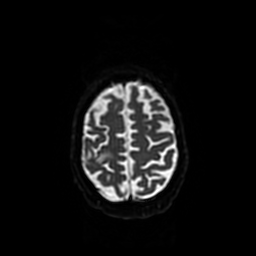
[im 128/128]
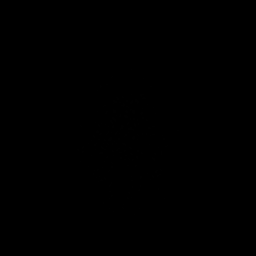

[Series 4: FLAIR · axial · 3.0mm · 0.51mm/px · z∈[-63,+123]mm · 3 of 63 slices shown (2 of 2)]
[im 1/63]
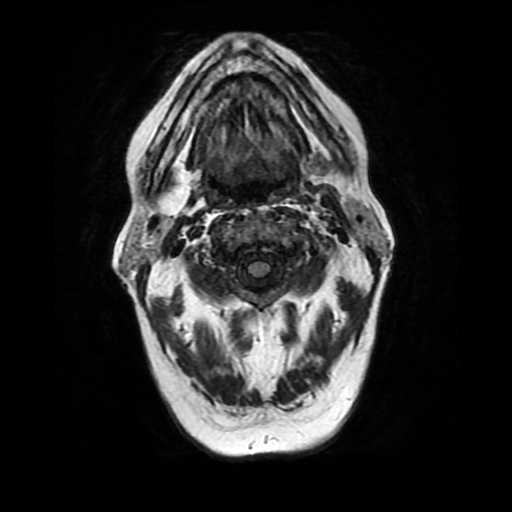
[im 32/63]
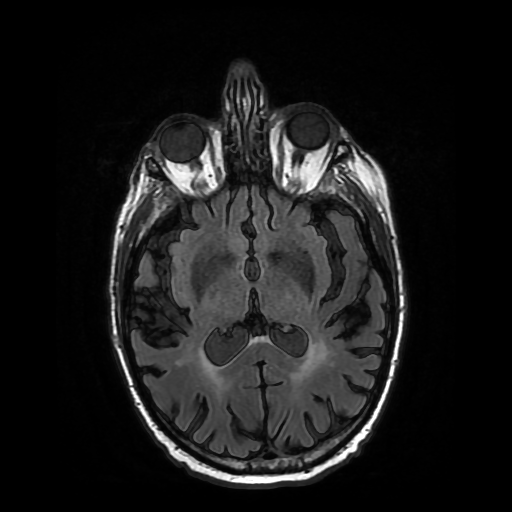
[im 63/63]
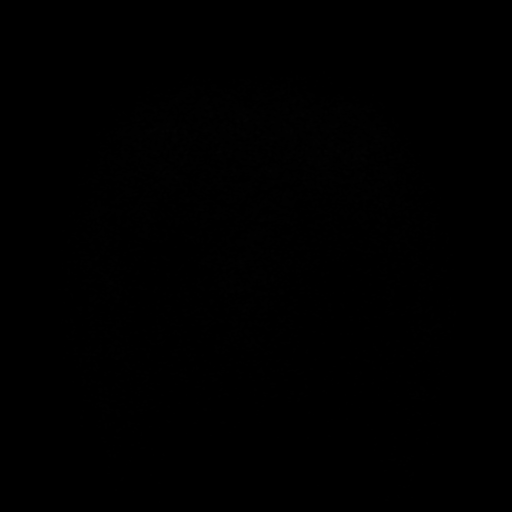

[Series 6: T2 · axial · 5.0mm · 0.47mm/px · 1 of 31 slices shown]
[im 1/31]
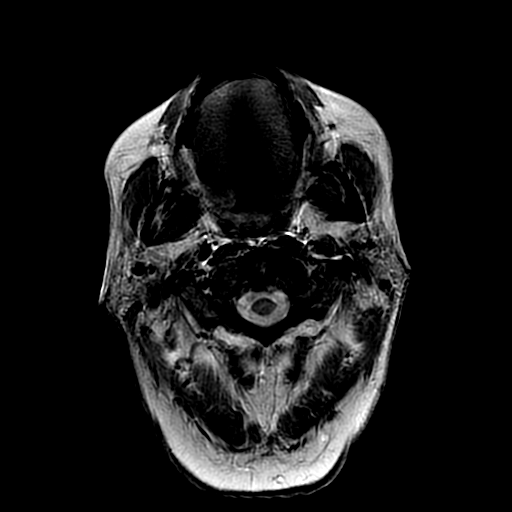

[Series 7: SWI · axial · 3.0mm · 0.47mm/px · z∈[-22,+61]mm · 3 of 116 slices shown]
[im 1/116]
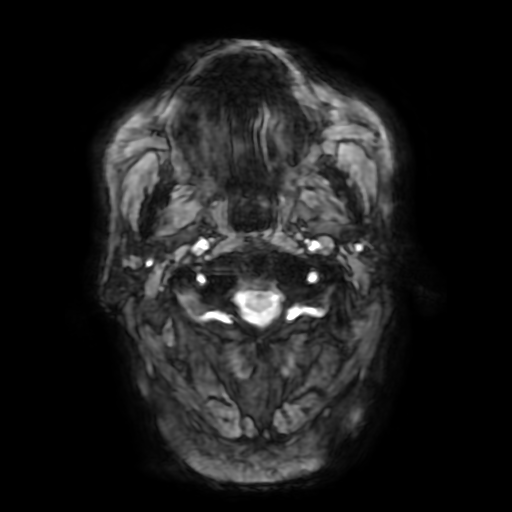
[im 29/116]
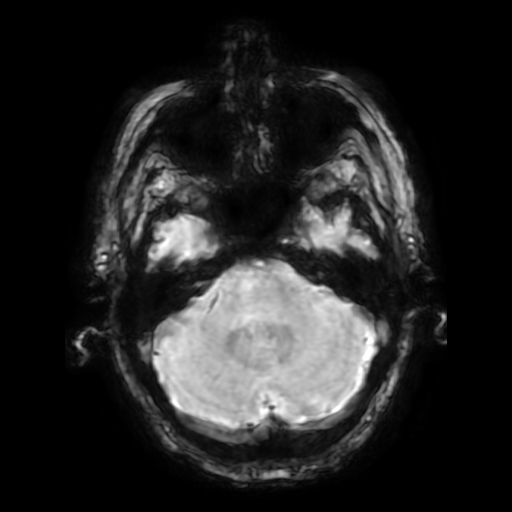
[im 58/116]
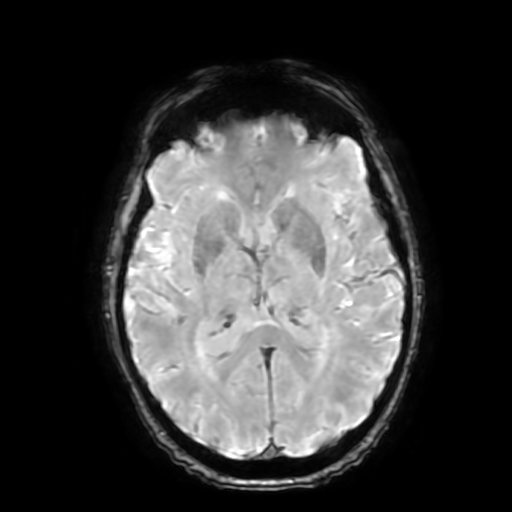

[Series 8: T2 post-contrast · coronal · 3.0mm · 0.47mm/px · 3 of 64 slices shown]
[im 1/64]
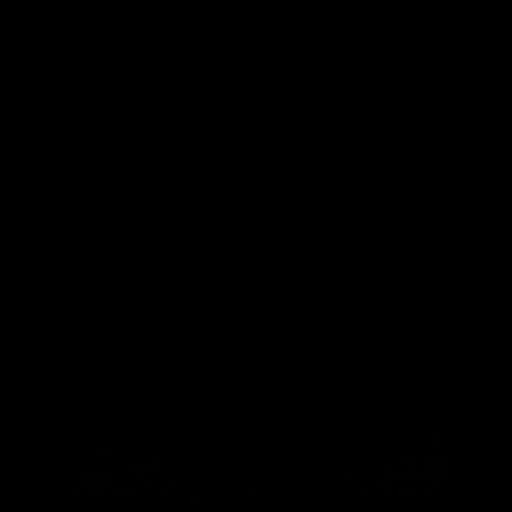
[im 32/64]
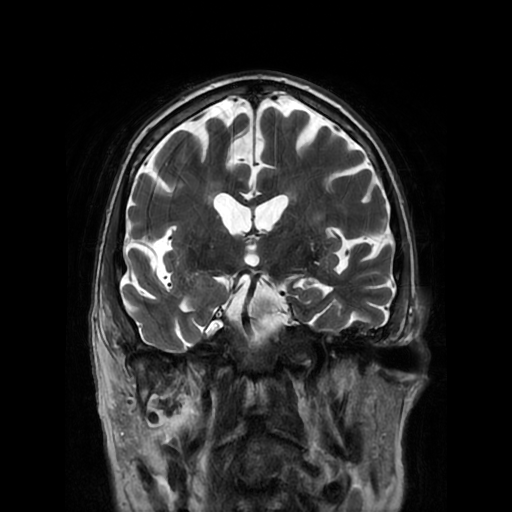
[im 64/64]
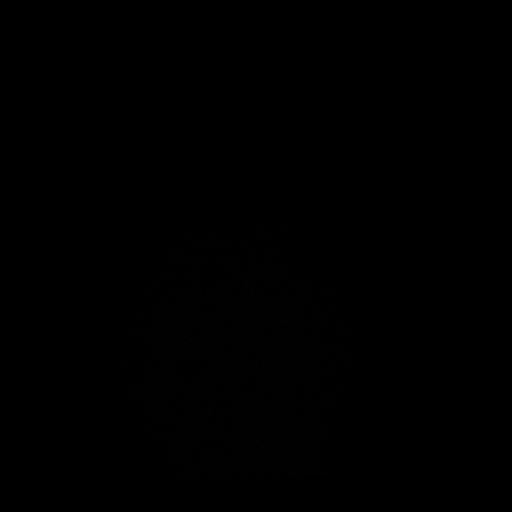

[Series 10: T1 post-contrast · coronal · 3.0mm · 0.47mm/px · 3 of 64 slices shown]
[im 1/64]
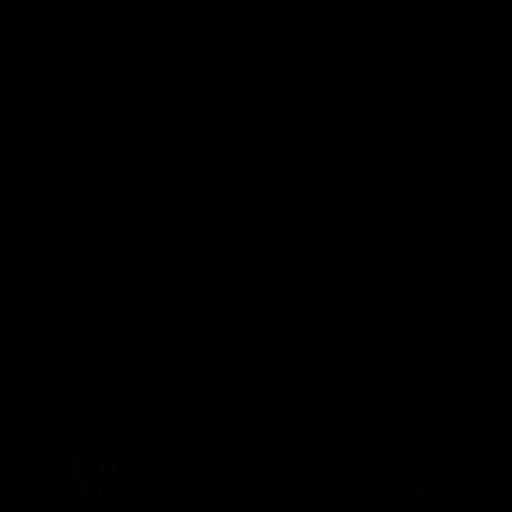
[im 32/64]
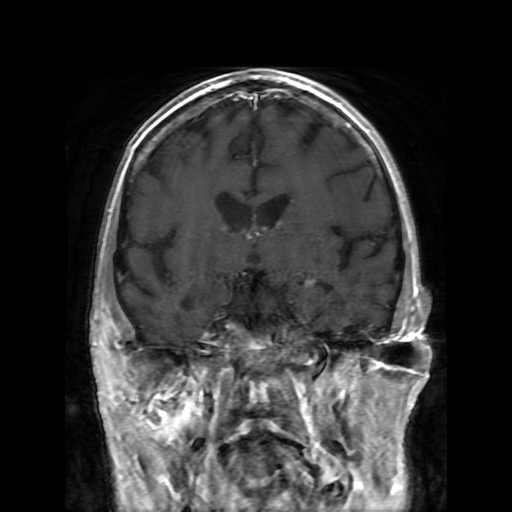
[im 64/64]
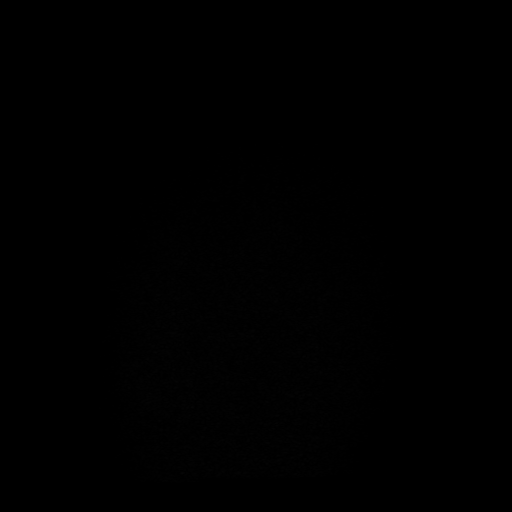

[Series 11: FLAIR post-contrast · sagittal · 3.0mm · 0.51mm/px · 2 of 55 slices shown]
[im 1/55]
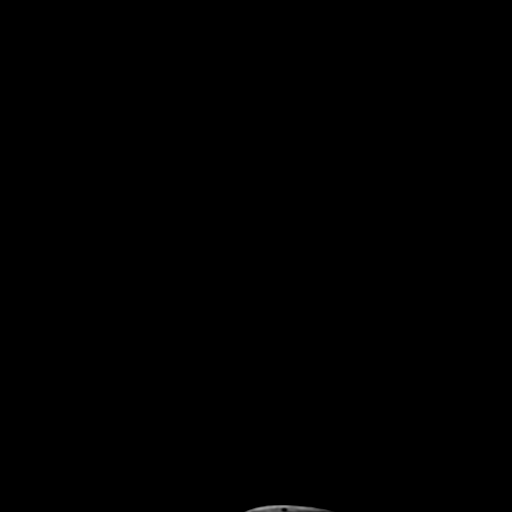
[im 55/55]
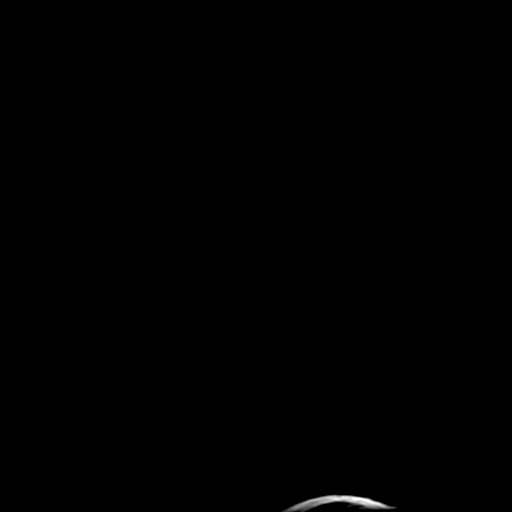

[Series 350: ADC · axial · 3.0mm · 1.09mm/px · z∈[-16,+157]mm · 3 of 61 slices shown]
[im 1/61]
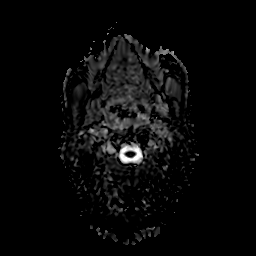
[im 31/61]
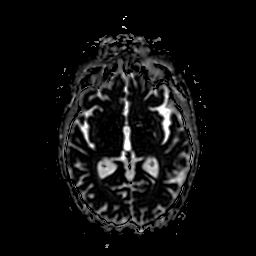
[im 61/61]
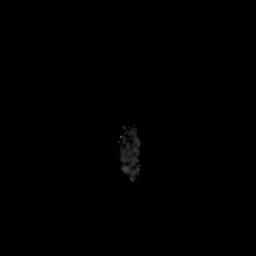

[25 of 48 positions shown; findings below may reference images not displayed]

FINDINGS: Brain: Marked improvement in metastatic disease.

The largest lesion in the right parietal lobe on the prior study is
markedly smaller. Small residual enhancing focus measures
approximately 3 x 12 mm. Minimal residual white matter edema.

Left frontal convexity lesion has nearly completely resolved with
minimal residual enhancement on axial image number 154.

Left lateral frontal lesion has resolved. Left occipital parietal
cortical lesion has resolved. Left parietal periventricular white
matter small lesion has resolved.

No new lesions identified. No hemorrhage or acute infarct. Moderate
atrophy. Chronic microvascular ischemic changes in the white matter
and pons

Vascular: Normal arterial flow voids

Skull and upper cervical spine: No focal skeletal lesion.

Sinuses/Orbits: Negative

Other: None
IMPRESSION: Marked improvement in metastatic disease to the brain. No new
lesions identified.

## 2019-10-04 MED ORDER — GADOBUTROL 1 MMOL/ML IV SOLN
6.0000 mL | Freq: Once | INTRAVENOUS | Status: AC | PRN
  Administered 2019-10-04: 6 mL via INTRAVENOUS

## 2019-10-08 ENCOUNTER — Telehealth: Payer: Self-pay

## 2019-10-08 NOTE — Telephone Encounter (Signed)
Spoke with wife to remind of appointment on 10/09/19 she stated she would like for it to be a telephone encounter instead of of a in office virtual appointment

## 2019-10-09 ENCOUNTER — Ambulatory Visit
Admission: RE | Admit: 2019-10-09 | Discharge: 2019-10-09 | Disposition: A | Discharge status: Home / Self Care | Payer: Medicare Other | Source: Ambulatory Visit | Attending: Radiation Oncology | Admitting: Radiation Oncology

## 2019-10-09 ENCOUNTER — Other Ambulatory Visit: Payer: Self-pay

## 2019-10-09 DIAGNOSIS — Z510 Encounter for antineoplastic radiation therapy: Secondary | ICD-10-CM | POA: Insufficient documentation

## 2019-10-09 DIAGNOSIS — C7931 Secondary malignant neoplasm of brain: Secondary | ICD-10-CM | POA: Insufficient documentation

## 2019-10-09 DIAGNOSIS — Z08 Encounter for follow-up examination after completed treatment for malignant neoplasm: Secondary | ICD-10-CM | POA: Diagnosis not present

## 2019-10-09 DIAGNOSIS — C3490 Malignant neoplasm of unspecified part of unspecified bronchus or lung: Secondary | ICD-10-CM | POA: Insufficient documentation

## 2019-10-09 DIAGNOSIS — Z85118 Personal history of other malignant neoplasm of bronchus and lung: Secondary | ICD-10-CM | POA: Diagnosis not present

## 2019-10-09 NOTE — Progress Notes (Addendum)
Radiation Oncology         [phone] ________________________________  Name: Walter Hernandez MRN: [id number]  Date: 10/09/2019  DOB: 11/21/1950  Post Treatment Note  CC: Lavone Orn, MD  Lavone Orn, MD  Diagnosis:   69 yo man with brain metastases from extensive stage small cell lung cancer.  Interval Since Last Radiation:  3 months  1/13-1/26/21:   The whole brain was treated to 30 Gy in 10 fractions of 3 Gy  Narrative:  I spoke with the patient and his wife to conduct his routine scheduled 3 month follow up visit to review results from his recent brain MRI via telephone to spare the patient unnecessary potential exposure in the healthcare setting during the current COVID-19 pandemic.  The patient was notified in advance and gave permission to proceed with this visit format. The patient has recovered well from his recent WBRT and is currently without complaints aside from some dysphagia that he has been dealing with on and off for several years associated with his known hiatal hernia.   He denies recent headaches, N/V, dizziness, imbalance, focal weakness, changes in auditory or visual acuity, tremors or seizure activity. He has completed 4 cycles of systemic chemotherapy with Carboplatin/VP-16/Tecentriq- started on 07/07/2018 under the care and direction of Dr. Marin Olp. He recently started maintenance immunotherapy with Tecentriq- first cycle was 10/01/19 which he has tolerated well. The plan is to continue this current regimen for now and he will have a restaging PET scan on 10/22/19.    His recent post-treatment brain MRI from 10/04/19 shows an excellent response to his radiation treatment with the only visible lesion remaining being the largest lesion in the right parietal lobe which is markedly decreased in size and the other treated lesions no longer visible.  There were no new lesions noted.             On review of systems, the patient states that he is doing very well in general.  He  has had significant improvement in the fatigue over the past 3-4 weeks and no longer having any N/V/D.  His appetite has improved as well but he continues to have difficulty swallowing anything solid, feeling like it gets stuck in his mid-chest. He is scheduled for barium swallow and endoscopy next week for further evaluation and likely esophageal dilatation. He is drinking 3-4 Ensure per day to supplement his nutrition in addition to eating yogurt, milkshakes/icecream and other soft foods. Otherwise, he denies headaches, dizziness, paraesthesias, focal weakness, tremors or seizure activity.  He reports dramatic improvement in the left sided weakness within a week of starting radiation and this is now completely resolved which he is delighted with. He feels like he is gradually regaining his strength. Overall, he is quite pleased with his progress to date.  ALLERGIES:  has No Known Allergies.  Meds: Current Outpatient Medications  Medication Sig Dispense Refill  . acetaminophen (TYLENOL) 500 MG tablet Take 1,000 mg by mouth every 6 (six) hours as needed for moderate pain.    Marland Kitchen ALPRAZolam (XANAX) 0.5 MG tablet Take 1 tablet (0.5 mg total) by mouth every 6 (six) hours as needed for anxiety. 60 tablet 0  . finasteride (PROSCAR) 5 MG tablet TAKE 1 TABLET BY MOUTH EVERY DAY 90 tablet 1  . memantine (NAMENDA) 5 MG tablet Starting 06/18/19 Take one tab po daily. Starting 06/25/19 Take one tab po q am and one tab po q pm. Starting 1/26/21Take two tabs po q am, and one  tab po q pm. Starting 07/09/19 Take two tabs po q am and two tabs po q pm until 12/03/19. 602 tablet 0  . Multiple Vitamin (MULTIVITAMIN) capsule Take 1 capsule by mouth daily.    . Naphazoline-Pheniramine (OPCON-A OP) Place 2 drops into both eyes as needed (for dry eyes).    . nicotine (NICODERM CQ - DOSED IN MG/24 HOURS) 21 mg/24hr patch PLACE 1 PATCH (21 MG TOTAL) ONTO THE SKIN DAILY. 28 patch 2  . omeprazole (PRILOSEC) 40 MG capsule Take 1  capsule (40 mg total) by mouth 2 (two) times daily. 180 capsule 3  . dronabinol (MARINOL) 5 MG capsule Take 1 capsule (5 mg total) by mouth 2 (two) times daily before a meal. (Patient not taking: Reported on 10/03/2019) 60 capsule 2  . furosemide (LASIX) 20 MG tablet Take 2 tablets (40 mg total) by mouth daily. (Patient not taking: Reported on 10/03/2019) 30 tablet 5  . lidocaine-prilocaine (EMLA) cream Apply to affected area once (Patient not taking: Reported on 10/03/2019) 30 g 3  . LORazepam (ATIVAN) 0.5 MG tablet Take 1 tablet (0.5 mg total) by mouth every 6 (six) hours as needed (Nausea or vomiting). (Patient not taking: Reported on 10/03/2019) 30 tablet 0  . ondansetron (ZOFRAN) 4 MG tablet Take 1 tablet (4 mg total) by mouth every 6 (six) hours as needed for nausea. (Patient not taking: Reported on 10/03/2019) 20 tablet 0  . ondansetron (ZOFRAN) 8 MG tablet Take 1 tablet (8 mg total) by mouth 2 (two) times daily as needed for refractory nausea / vomiting. Start on day 3 after carboplatin chemo. (Patient not taking: Reported on 10/03/2019) 30 tablet 1  . potassium chloride SA (KLOR-CON) 20 MEQ tablet Take 1 tablet (20 mEq total) by mouth daily. (Patient not taking: Reported on 10/03/2019) 30 tablet 4  . prochlorperazine (COMPAZINE) 10 MG tablet Take 1 tablet (10 mg total) by mouth every 6 (six) hours as needed (Nausea or vomiting). (Patient not taking: Reported on 10/03/2019) 30 tablet 1  . tamsulosin (FLOMAX) 0.4 MG CAPS capsule Take 2 capsules (0.8 mg total) by mouth daily. (Patient not taking: Reported on 10/09/2019) 60 capsule 1   No current facility-administered medications for this encounter.    Physical Findings:  vitals were not taken for this visit.   /Unable to assess due to telephone follow-up visit format.  Lab Findings: Lab Results  Component Value Date   WBC 15.1 (H) 10/01/2019   HGB 9.5 (L) 10/01/2019   HCT 29.1 (L) 10/01/2019   MCV 98.3 10/01/2019   PLT 354 10/01/2019      Radiographic Findings: MR Brain W Wo Contrast  Result Date: 10/04/2019 CLINICAL DATA:  Metastatic disease to brain. History of small cell lung cancer and melanoma. EXAM: MRI HEAD WITHOUT AND WITH CONTRAST TECHNIQUE: Multiplanar, multiecho pulse sequences of the brain and surrounding structures were obtained without and with intravenous contrast. CONTRAST:  10mL GADAVIST GADOBUTROL 1 MMOL/ML IV SOLN COMPARISON:  MRI head 06/14/2019 FINDINGS: Brain: Marked improvement in metastatic disease. The largest lesion in the right parietal lobe on the prior study is markedly smaller. Small residual enhancing focus measures approximately 3 x 12 mm. Minimal residual white matter edema. Left frontal convexity lesion has nearly completely resolved with minimal residual enhancement on axial image number 154. Left lateral frontal lesion has resolved. Left occipital parietal cortical lesion has resolved. Left parietal periventricular white matter small lesion has resolved. No new lesions identified. No hemorrhage or acute infarct. Moderate atrophy. Chronic  microvascular ischemic changes in the white matter and pons Vascular: Normal arterial flow voids Skull and upper cervical spine: No focal skeletal lesion. Sinuses/Orbits: Negative Other: None IMPRESSION: Marked improvement in metastatic disease to the brain. No new lesions identified. Electronically Signed   By: Franchot Gallo M.D.   On: 10/04/2019 21:05    Impression/Plan: 1. 69 yo man with brain metastases from extensive stage small cell lung cancer. He appears to have recovered well from the effects of his recent palliative whole brain radiation and currently remains without complaints.  He has continued taking Namenda daily as prescribed and is tolerating this well.  He plans to continue taking the Namenda as directed until the end of June in an effort to prevent/reduce cognitive side effects associated with whole brain radiation.  His recent brain MRI shows an  excellent response to treatment so we discussed the plan to continue to monitor the brain closely with serial MRI scans of the brain every 3 months for the first 1-2 years and then every 4-6 months thereafter. He does require pre-medication with Ativan to tolerate the MRI scans so I will make sure this is available to him for use prior to each scan. We will follow up by telephone following each scan to review results and recommendation from the multi-disciplinary brain conference.   He will also continue in routine follow-up under the care and direction of Dr. Marin Olp for continued management of his systemic disease.  He and his wife appear to have a good understanding of these recommendations and are comfortable and in agreement with the stated plan.  They know to call at anytime with any questions or concerns related to his previous radiation.  Given current concerns for patient exposure during the COVID-19 pandemic, this encounter was conducted via telephone. The patient was notified in advance and was offered a [place] meeting to allow for face to face communication but unfortunately reported that he did not have the appropriate resources/technology to support such a visit and instead preferred to proceed with telephone consult. The patient has given verbal consent for this type of encounter. The time spent during this encounter was 25 minutes. The attendants for this meeting include Emmerich Cryer PA-C, patient, Garnett Nunziata and his wife, Kennyth Lose. During the encounter, Marveen Donlon PA-C, was located at [facility] Radiation Oncology Department.  Patient, Staley Lunz and his wife, Kennyth Lose  were located at home.    Nicholos Johns, PA-C

## 2019-10-14 NOTE — Progress Notes (Signed)
Pharmacist Chemotherapy Monitoring - Follow Up Assessment    I verify that I have reviewed each item in the below checklist:  . Regimen for the patient is scheduled for the appropriate day and plan matches scheduled date. Marland Kitchen Appropriate non-routine labs are ordered dependent on drug ordered. . If applicable, additional medications reviewed and ordered per protocol based on lifetime cumulative doses and/or treatment regimen.   Plan for follow-up and/or issues identified: No . I-vent associated with next due treatment: No . MD and/or nursing notified: No  Moana Munford, [name] 10/14/2019 9:00 AM

## 2019-10-15 ENCOUNTER — Other Ambulatory Visit: Payer: Self-pay

## 2019-10-15 ENCOUNTER — Encounter: Payer: Self-pay | Admitting: *Deleted

## 2019-10-15 ENCOUNTER — Telehealth: Payer: Self-pay | Admitting: Gastroenterology

## 2019-10-15 ENCOUNTER — Ambulatory Visit (HOSPITAL_COMMUNITY)
Admission: RE | Admit: 2019-10-15 | Discharge: 2019-10-15 | Disposition: A | Discharge status: Home / Self Care | Payer: Medicare Other | Source: Ambulatory Visit | Attending: Gastroenterology | Admitting: Gastroenterology

## 2019-10-15 DIAGNOSIS — R1319 Other dysphagia: Secondary | ICD-10-CM

## 2019-10-15 DIAGNOSIS — R1310 Dysphagia, unspecified: Secondary | ICD-10-CM | POA: Insufficient documentation

## 2019-10-15 DIAGNOSIS — K219 Gastro-esophageal reflux disease without esophagitis: Secondary | ICD-10-CM

## 2019-10-15 DIAGNOSIS — K224 Dyskinesia of esophagus: Secondary | ICD-10-CM | POA: Diagnosis not present

## 2019-10-15 IMAGING — RF DG ESOPHAGUS
6 of 7 series · 18 of 22 positions shown · non-contrast
Comparison: None.

CLINICAL DATA: Dysphagia

EXAM:
ESOPHOGRAM / BARIUM SWALLOW / BARIUM TABLET STUDY
TECHNIQUE: Combined double contrast and single contrast examination performed
using effervescent crystals, thick barium liquid, and thin barium
liquid. The patient was observed with fluoroscopy swallowing a 13 mm
barium sulphate tablet.
FLUOROSCOPY TIME:  Fluoroscopy Time:  1 minutes 24 seconds
Radiation Exposure Index (if provided by the fluoroscopic device):
13.5 mGy

[Series 1: cp_standard · 0.58mm/px · 3 of 180 frames shown (1 of 6)]
[frame 16/180]
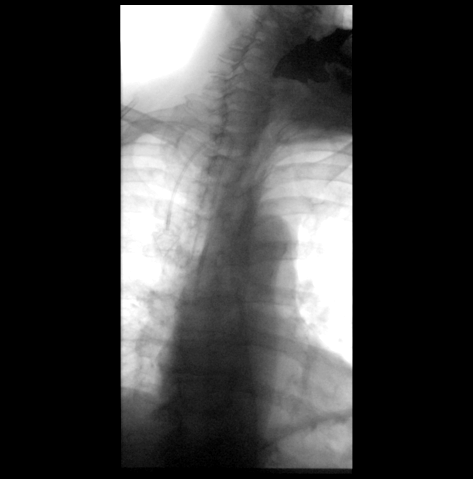
[frame 28/180]
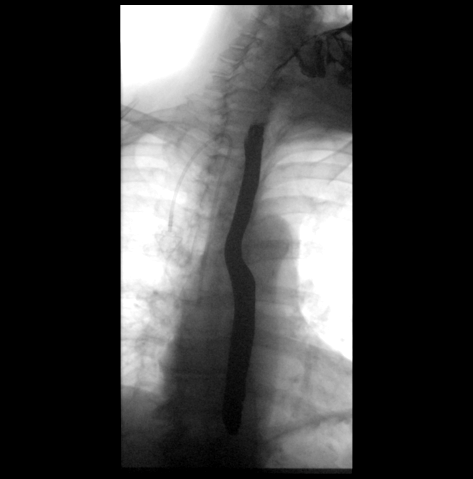
[frame 154/180]
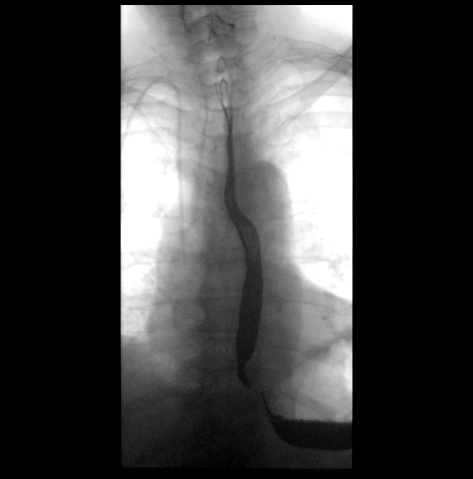

[Series 2: cp_standard · 0.58mm/px · 4 of 161 frames shown (2 of 6)]
[frame 2/161]
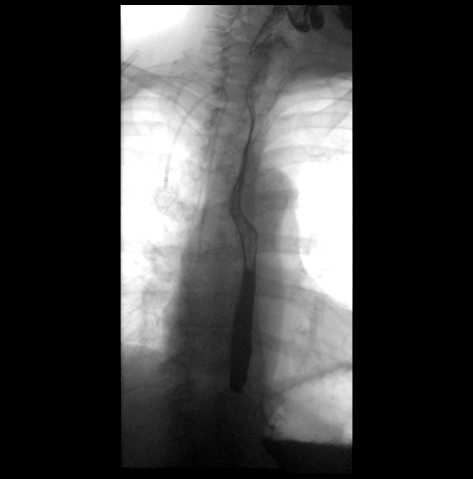
[frame 25/161]
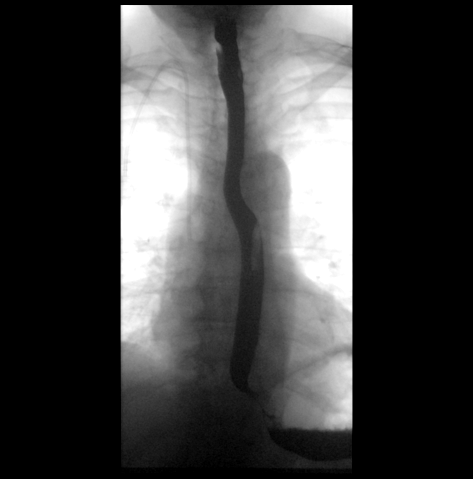
[frame 81/161]
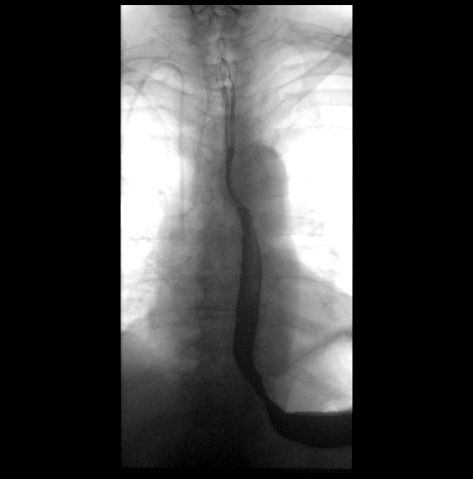
[frame 137/161]
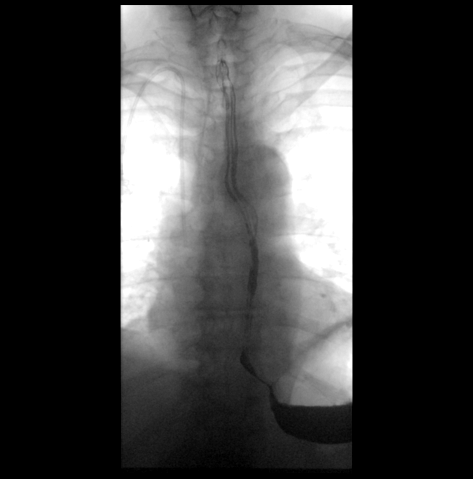

[Series 3: cp_standard · 0.58mm/px · 3 of 158 frames shown (3 of 6)]
[frame 80/158]
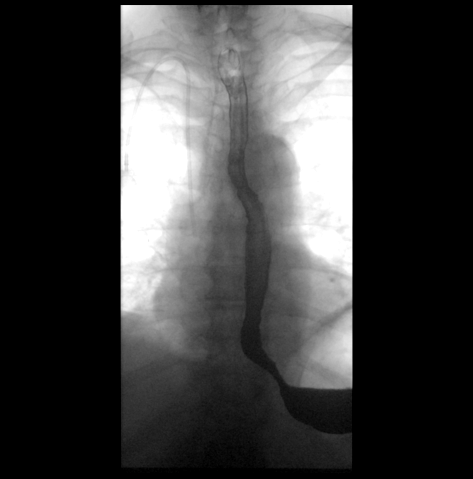
[frame 122/158]
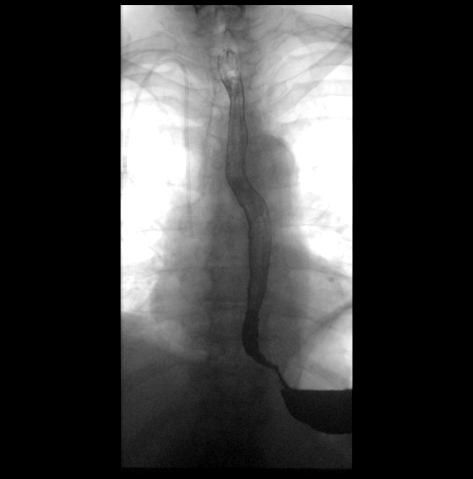
[frame 135/158]
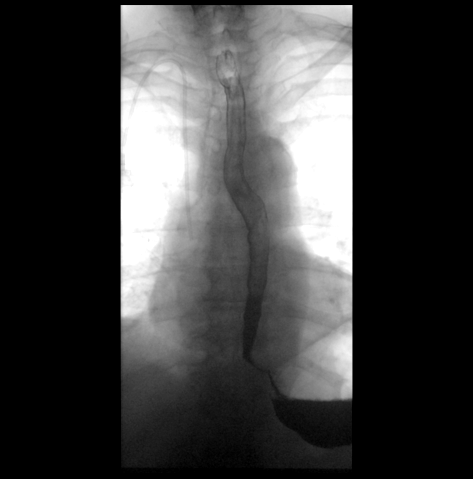

[Series 4: cp_standard · 0.29mm/px · 1 of 1 slices shown (4 of 6)]
[im 1/1]
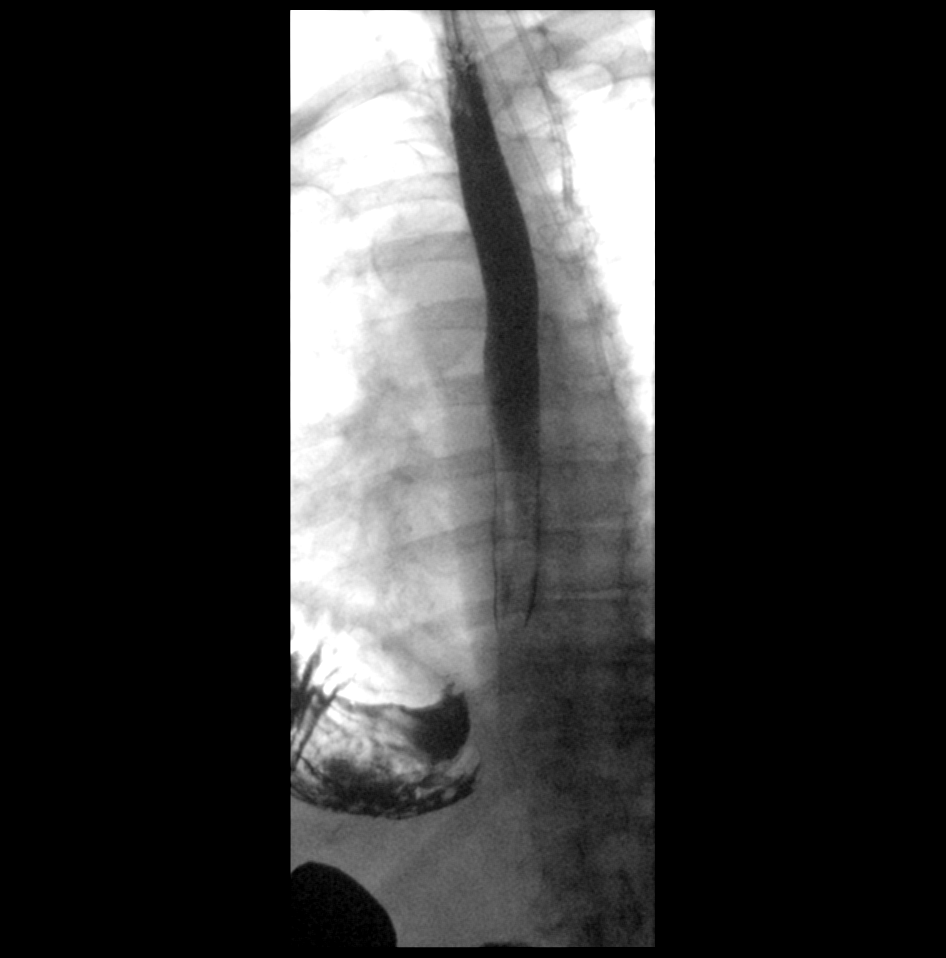

[Series 6: cp_standard · 0.58mm/px · 4 of 106 frames shown (5 of 6)]
[frame 16/106]
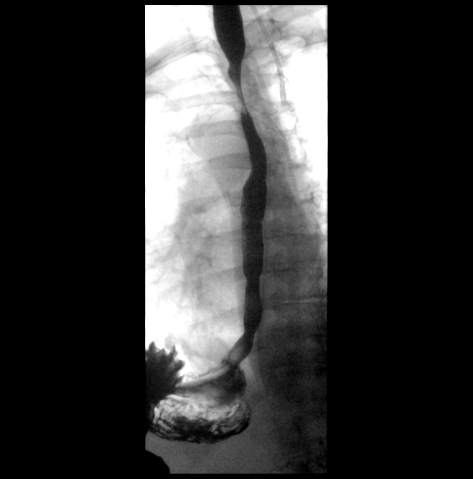
[frame 36/106]
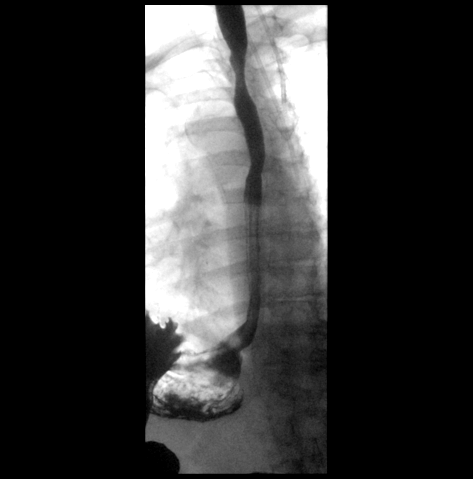
[frame 54/106]
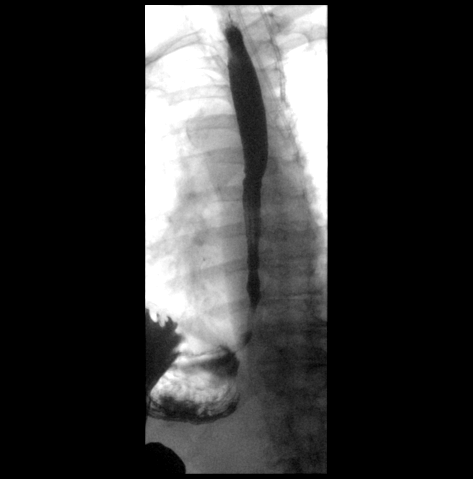
[frame 91/106]
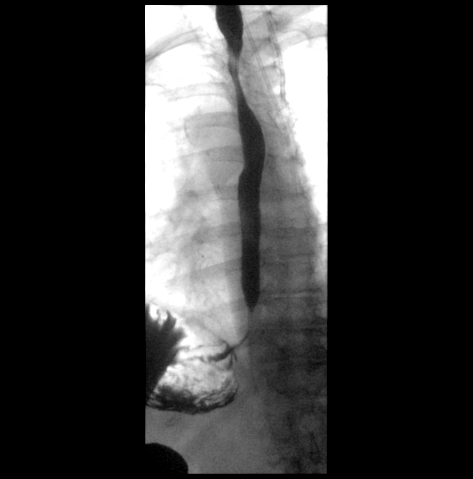

[Series 7: cp_standard · 0.58mm/px · 3 of 134 frames shown (6 of 6)]
[frame 21/134]
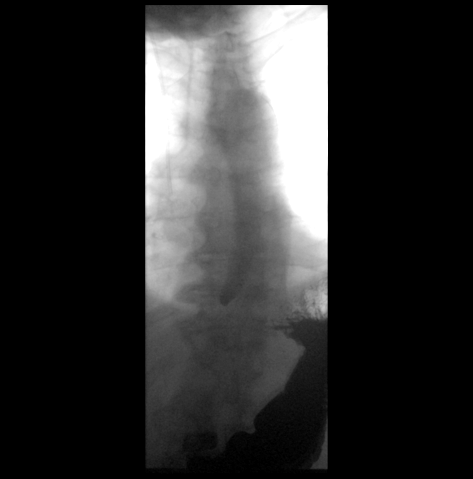
[frame 68/134]
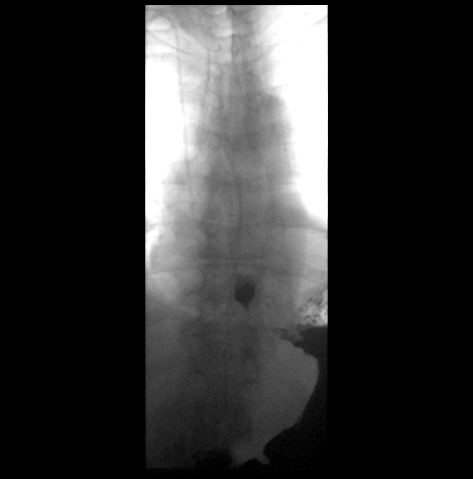
[frame 114/134]
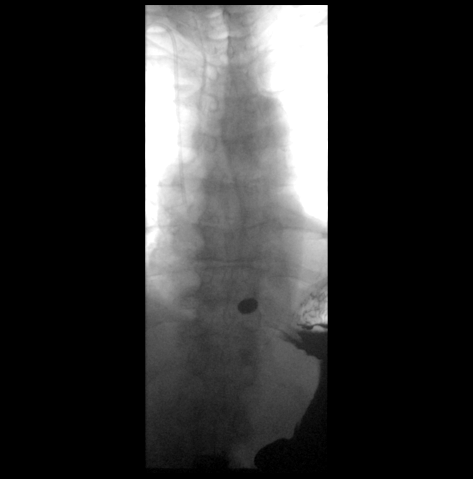

[18 of 22 positions shown; findings below may reference images not displayed]

FINDINGS: Patient swallowed barium without difficulty. There is no mass or
stricture identified. The barium tablet did not immediately passed
through lower esophageal sphincter. However, this did not correlate
with symptoms. Mild dysmotility. No hiatal hernia. No evidence of
spontaneous gastroesophageal reflux.
IMPRESSION: No mass or stricture. Barium tablet did not immediately pass through
lower esophageal sphincter, but this did not correlate with
symptoms. Mild dysmotility.

## 2019-10-16 NOTE — Telephone Encounter (Signed)
I have called and spoke to patient and let him know Dr. Lyndel Safe has not had a chance to review these results yet. Patient wants to know what the next step is.

## 2019-10-16 NOTE — Telephone Encounter (Signed)
Barium swallow reviewed-no definite strictures but there was temporary hang-up of barium tablet at GE junction Proceed with plan as outlined in the last note-proceed with EGD with dilatation Must continue PPIs  RG

## 2019-10-17 ENCOUNTER — Other Ambulatory Visit: Payer: Self-pay

## 2019-10-17 ENCOUNTER — Ambulatory Visit (HOSPITAL_COMMUNITY)
Admission: RE | Admit: 2019-10-17 | Discharge: 2019-10-17 | Disposition: A | Discharge status: Home / Self Care | Payer: Medicare Other | Source: Ambulatory Visit | Attending: Hematology & Oncology | Admitting: Hematology & Oncology

## 2019-10-17 DIAGNOSIS — R591 Generalized enlarged lymph nodes: Secondary | ICD-10-CM | POA: Diagnosis not present

## 2019-10-17 DIAGNOSIS — Z79899 Other long term (current) drug therapy: Secondary | ICD-10-CM | POA: Diagnosis not present

## 2019-10-17 DIAGNOSIS — C787 Secondary malignant neoplasm of liver and intrahepatic bile duct: Secondary | ICD-10-CM | POA: Diagnosis not present

## 2019-10-17 DIAGNOSIS — R918 Other nonspecific abnormal finding of lung field: Secondary | ICD-10-CM | POA: Insufficient documentation

## 2019-10-17 DIAGNOSIS — C3490 Malignant neoplasm of unspecified part of unspecified bronchus or lung: Secondary | ICD-10-CM | POA: Diagnosis not present

## 2019-10-17 LAB — GLUCOSE, CAPILLARY: Glucose-Capillary: 96 mg/dL (ref 70–99)

## 2019-10-17 IMAGING — CT NM PET TUM IMG RESTAG (PS) SKULL BASE T - THIGH
1 of 8 series · 1 of 25 positions shown · non-contrast
Comparison: PET-CT [DATE] and [DATE].

CLINICAL DATA: Subsequent treatment strategy for small cell lung
cancer. Undergoing chemotherapy and immunotherapy.

EXAM:
NUCLEAR MEDICINE PET SKULL BASE TO THIGH
TECHNIQUE: 7.5 mCi F-18 FDG was injected intravenously. Full-ring PET imaging
was performed from the skull base to thigh after the radiotracer. CT
data was obtained and used for attenuation correction and anatomic
localization.
Fasting blood glucose: 96 mg/dl

[Series 4: ct sk_thigh 5.0 b31f · axial · 5.0mm · 0.93mm/px · 1 of 218 slices shown]
[im 218/218  brain]
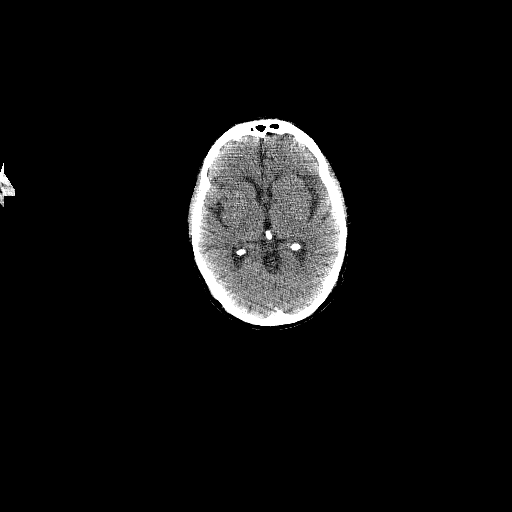

[1 of 25 positions shown; findings below may reference images not displayed]

FINDINGS: Mediastinal blood pool activity: SUV max

NECK:

The hypermetabolic posterolateral right supraclavicular node is
slightly smaller, measuring 2.1 x 2.0 cm on image 46/4, although
demonstrates increased metabolic activity with an SUV max of
(previously 7.2). No new hypermetabolic cervical lymph nodes.There
are no lesions of the pharyngeal mucosal space.

Incidental CT findings: Bilateral carotid atherosclerosis.

CHEST:

The degree of metabolic uptake in multiple mediastinal and hilar
lymph nodes has also increased. Medial AP window node measuring 16
mm on image 70/4 has not significantly changed in size although
demonstrates an SUV max of 14.2 (previously 7.1). 14 mm subcarinal
node on image 74/4 has an SUV max of 10.3 (previously 6.2. There is
no hypermetabolic pulmonary activity or suspicious pulmonary
nodularity. 6 mm left upper lobe nodule on image [DATE] and 9 mm right
lower lobe nodule on image 35/8 are stable.

Incidental CT findings: Right IJ Port-A-Cath extends to the superior
cavoatrial junction. There is atherosclerosis of the aorta, great
vessels and coronary arteries. Interval improvement in the patchy
airspace opacities noted previously in both lungs. No significant
pleural effusion.

ABDOMEN/PELVIS:

Significant interval worsening of the multifocal hepatic metastatic
disease. There are multiple enlarging lesions with increased
metabolic activity. Largest lesion centrally in the right hepatic
lobe measures 2.0 x 1.9 cm on image 102/4 and has an SUV max of
(previously 1.5 cm, SUV max 4.6). There is also mildly progressive
adenopathy in the porta hepatis and gastrohepatic ligament. A
portacaval node has an SUV max of 15.9 (previously 12.8). No
hypermetabolic nodes in the lower abdomen or pelvis. No abnormal
activity within the spleen, pancreas, adrenal glands or kidneys.

Incidental CT findings: There is dense barium throughout the colon
related to recent esophagram. Aortic and branch vessel
atherosclerosis noted.

SKELETON:

Increased activity within the posterior elements of the lumbar
spine, especially on the left at L2-3 (SUV max 4.8). This could be
degenerative, although metastatic disease cannot be completely
excluded. There is multilevel lumbar spondylosis associated with a
scoliosis.

Incidental CT findings: As above, multilevel thoracolumbar
spondylosis.
IMPRESSION: 1. Interval definite worsening in multifocal hepatic metastatic
disease. There are enlarging lesions with increased metabolic
activity.
2. Overall increased metabolic activity within multiple enlarged
lymph nodes in the right supraclavicular space, mediastinum, right
hilum and upper abdomen. Most of these nodes are similar in size to
the previous study, and the increased metabolic activity may in part
be secondary to immunotherapy. However, given the hepatic
progression, nodal progression is likely.
3. Possible metastatic disease within the posterior elements of the
lumbar spine. This is not definitive and may be related to
underlying spondylosis.
4. Stable small bilateral pulmonary nodules without hypermetabolic
activity.

## 2019-10-17 MED ORDER — FLUDEOXYGLUCOSE F - 18 (FDG) INJECTION
7.5000 | Freq: Once | INTRAVENOUS | Status: AC
  Administered 2019-10-17: 7.5 via INTRAVENOUS

## 2019-10-17 NOTE — Telephone Encounter (Signed)
Patient has been informed. Scheduled for EGD on 10/23/19 at 8:30am.

## 2019-10-21 ENCOUNTER — Inpatient Hospital Stay: Payer: Medicare Other | Attending: Hematology & Oncology

## 2019-10-21 ENCOUNTER — Inpatient Hospital Stay: Payer: Medicare Other

## 2019-10-21 ENCOUNTER — Inpatient Hospital Stay (HOSPITAL_BASED_OUTPATIENT_CLINIC_OR_DEPARTMENT_OTHER): Payer: Medicare Other | Admitting: Hematology & Oncology

## 2019-10-21 ENCOUNTER — Encounter: Payer: Self-pay | Admitting: *Deleted

## 2019-10-21 ENCOUNTER — Other Ambulatory Visit: Payer: Self-pay

## 2019-10-21 ENCOUNTER — Encounter: Payer: Self-pay | Admitting: Hematology & Oncology

## 2019-10-21 VITALS — BP 133/77 | HR 103 | Temp 98.0°F | Resp 18 | Wt 142.0 lb

## 2019-10-21 DIAGNOSIS — Z5111 Encounter for antineoplastic chemotherapy: Secondary | ICD-10-CM | POA: Diagnosis not present

## 2019-10-21 DIAGNOSIS — C3431 Malignant neoplasm of lower lobe, right bronchus or lung: Secondary | ICD-10-CM | POA: Diagnosis present

## 2019-10-21 DIAGNOSIS — Z79899 Other long term (current) drug therapy: Secondary | ICD-10-CM | POA: Diagnosis not present

## 2019-10-21 DIAGNOSIS — C7800 Secondary malignant neoplasm of unspecified lung: Secondary | ICD-10-CM | POA: Insufficient documentation

## 2019-10-21 DIAGNOSIS — C4372 Malignant melanoma of left lower limb, including hip: Secondary | ICD-10-CM | POA: Insufficient documentation

## 2019-10-21 DIAGNOSIS — C787 Secondary malignant neoplasm of liver and intrahepatic bile duct: Secondary | ICD-10-CM | POA: Insufficient documentation

## 2019-10-21 DIAGNOSIS — R599 Enlarged lymph nodes, unspecified: Secondary | ICD-10-CM | POA: Insufficient documentation

## 2019-10-21 DIAGNOSIS — C3490 Malignant neoplasm of unspecified part of unspecified bronchus or lung: Secondary | ICD-10-CM

## 2019-10-21 DIAGNOSIS — Z5112 Encounter for antineoplastic immunotherapy: Secondary | ICD-10-CM | POA: Diagnosis not present

## 2019-10-21 DIAGNOSIS — C7931 Secondary malignant neoplasm of brain: Secondary | ICD-10-CM | POA: Insufficient documentation

## 2019-10-21 DIAGNOSIS — R634 Abnormal weight loss: Secondary | ICD-10-CM | POA: Diagnosis not present

## 2019-10-21 LAB — CBC WITH DIFFERENTIAL (CANCER CENTER ONLY)
Abs Immature Granulocytes: 0.06 10*3/uL (ref 0.00–0.07)
Basophils Absolute: 0.1 10*3/uL (ref 0.0–0.1)
Basophils Relative: 1 %
Eosinophils Absolute: 0.1 10*3/uL (ref 0.0–0.5)
Eosinophils Relative: 2 %
HCT: 31.4 % — ABNORMAL LOW (ref 39.0–52.0)
Hemoglobin: 10.4 g/dL — ABNORMAL LOW (ref 13.0–17.0)
Immature Granulocytes: 1 %
Lymphocytes Relative: 21 %
Lymphs Abs: 1.4 10*3/uL (ref 0.7–4.0)
MCH: 32.1 pg (ref 26.0–34.0)
MCHC: 33.1 g/dL (ref 30.0–36.0)
MCV: 96.9 fL (ref 80.0–100.0)
Monocytes Absolute: 0.7 10*3/uL (ref 0.1–1.0)
Monocytes Relative: 11 %
Neutro Abs: 4.2 10*3/uL (ref 1.7–7.7)
Neutrophils Relative %: 64 %
Platelet Count: 243 10*3/uL (ref 150–400)
RBC: 3.24 MIL/uL — ABNORMAL LOW (ref 4.22–5.81)
RDW: 14.2 % (ref 11.5–15.5)
WBC Count: 6.5 10*3/uL (ref 4.0–10.5)
nRBC: 0 % (ref 0.0–0.2)

## 2019-10-21 LAB — CMP (CANCER CENTER ONLY)
ALT: 7 U/L (ref 0–44)
AST: 14 U/L — ABNORMAL LOW (ref 15–41)
Albumin: 3.8 g/dL (ref 3.5–5.0)
Alkaline Phosphatase: 73 U/L (ref 38–126)
Anion gap: 10 (ref 5–15)
BUN: 6 mg/dL — ABNORMAL LOW (ref 8–23)
CO2: 26 mmol/L (ref 22–32)
Calcium: 9.9 mg/dL (ref 8.9–10.3)
Chloride: 99 mmol/L (ref 98–111)
Creatinine: 0.59 mg/dL — ABNORMAL LOW (ref 0.61–1.24)
GFR, Est AFR Am: >60 mL/min (ref >60)
GFR, Estimated: >60 mL/min (ref >60)
Glucose, Bld: 127 mg/dL — ABNORMAL HIGH (ref 70–99)
Potassium: 3.7 mmol/L (ref 3.5–5.1)
Sodium: 135 mmol/L (ref 135–145)
Total Bilirubin: 0.4 mg/dL (ref 0.3–1.2)
Total Protein: 6.3 g/dL — ABNORMAL LOW (ref 6.5–8.1)

## 2019-10-21 LAB — IRON AND TIBC
Iron: 43 ug/dL (ref 42–163)
Saturation Ratios: 19 % — ABNORMAL LOW (ref 20–55)
TIBC: 225 ug/dL (ref 202–409)
UIBC: 182 ug/dL (ref 117–376)

## 2019-10-21 LAB — FERRITIN: Ferritin: 2549 ng/mL — ABNORMAL HIGH (ref 24–336)

## 2019-10-21 LAB — TSH: TSH: 2.329 u[IU]/mL (ref 0.320–4.118)

## 2019-10-21 LAB — LACTATE DEHYDROGENASE: LDH: 156 U/L (ref 98–192)

## 2019-10-21 MED ORDER — HEPARIN SOD (PORK) LOCK FLUSH 100 UNIT/ML IV SOLN
500.0000 [IU] | Freq: Once | INTRAVENOUS | Status: AC
  Administered 2019-10-21: 500 [IU] via INTRAVENOUS
  Filled 2019-10-21: qty 5

## 2019-10-21 MED ORDER — SODIUM CHLORIDE 0.9% FLUSH
10.0000 mL | INTRAVENOUS | Status: AC | PRN
  Administered 2019-10-21: 10 mL via INTRAVENOUS
  Filled 2019-10-21: qty 10

## 2019-10-21 NOTE — Patient Instructions (Signed)

## 2019-10-21 NOTE — Addendum Note (Signed)
Addended by: Rico Ala on: 10/21/2019 09:13 AM   Modules accepted: Orders, SmartSet

## 2019-10-21 NOTE — Progress Notes (Signed)
DISCONTINUE ON PATHWAY REGIMEN - Small Cell Lung     Cycles 1 through 4, every 21 days:     Atezolizumab      Carboplatin      Etoposide    Cycles 5 and beyond, every 21 days:     Atezolizumab   **Always confirm dose/schedule in your pharmacy ordering system**  REASON: Disease Progression PRIOR TREATMENT: [id number]: Atezolizumab 1,200 mg D1 + Carboplatin AUC=5 D1 + Etoposide 100 mg/m2 D1-3 q21 Days x 4 Cycles, Followed by Atezolizumab 1,200 mg Maintenance Until Progression or Unacceptable Toxicity TREATMENT RESPONSE: Partial Response (PR)  START ON PATHWAY REGIMEN - Small Cell Lung     A cycle is every 21 days:     Lurbinectedin   **Always confirm dose/schedule in your pharmacy ordering system**  Patient Characteristics: Relapsed or Progressive Disease, Second Line, Relapse < 3 Months Therapeutic Status: Relapsed or Progressive Disease Line of Therapy: Second Line Time to Relapse: Relapse < 3 Months Intent of Therapy: Non-Curative / Palliative Intent, Discussed with Patient

## 2019-10-21 NOTE — Progress Notes (Signed)
Pharmacist Chemotherapy Monitoring - Initial Assessment    Anticipated start date: 10/28/19  Regimen:  . Are orders appropriate based on the patient's diagnosis, regimen, and cycle? Yes . Does the plan date match the patient's scheduled date? Yes . Is the sequencing of drugs appropriate? Yes . Are the premedications appropriate for the patient's regimen? Yes . Prior Authorization for treatment is: Not Started o If applicable, is the correct biosimilar selected based on the patient's insurance? not applicable  Organ Function and Labs: Marland Kitchen Are dose adjustments needed based on the patient's renal function, hepatic function, or hematologic function? Yes . Are appropriate labs ordered prior to the start of patient's treatment? Yes . Other organ system assessment, if indicated: N/A . The following baseline labs, if indicated, have been ordered: pembrolizumab: baseline TSH +/- T4  Dose Assessment: . Are the drug doses appropriate? Yes . Are the following correct: o Drug concentrations Yes o IV fluid compatible with drug Yes o Administration routes Yes o Timing of therapy Yes . If applicable, does the patient have documented access for treatment and/or plans for port-a-cath placement? yes . If applicable, have lifetime cumulative doses been properly documented and assessed? yes Lifetime Dose Tracking  . Carboplatin: 1,760 mg = 0.01 % of the maximum lifetime dose of 999,999,999 mg  o   Toxicity Monitoring/Prevention: . The patient has the following take home antiemetics prescribed: Ondansetron . The patient has the following take home medications prescribed: N/A . Medication allergies and previous infusion related reactions, if applicable, have been reviewed and addressed. Yes . The patient's current medication list has been assessed for drug-drug interactions with their chemotherapy regimen. no significant drug-drug interactions were identified on review.  Order Review: . Are the treatment  plan orders signed? Yes . Is the patient scheduled to see a provider prior to their treatment? No  I verify that I have reviewed each item in the above checklist and answered each question accordingly.   Kennith Center, Pharm.D., CPP 10/21/2019@3 :14 PM

## 2019-10-21 NOTE — Progress Notes (Signed)
Hematology and Oncology Follow Up Visit  Walter Hernandez [id number] 1950-06-25 69 y.o. 10/21/2019   Principle Diagnosis:  Stage IB ([id number]) superficial spreading melanoma of the left lower leg  Small Cell Lung Cancer -- Extensive Stage -- lung/liver/brain mets  Past Therapy: Cranial XRT -- 06/19/2019 thru 07/02/2019  Current Therapy:        Carboplatin/VP-16/Tecentriq -- started on 07/07/2018, s/p cycle #4 Tecentriq - maintenance -- cycle#1 -- start on 10/01/2019 - d/c on 10/21/2019 Zepzelca/Keytruda -- q 3 wk -- start cycle #1 on 10/28/2019   Interim History:  Walter Hernandez is here today for follow-up.  Unfortunately, we are now dealing with progressive disease.  He had a PET scan done on Friday, May 13.  PET scan showed that he had progression, mainly in the liver.  He had worsening of his hepatic mets.  There was some increase in enlarged lymph nodes in the mediastinum, right hilum and upper abdomen.  There may also be some disease progressive disease in the lumbar spine.  He has had 4 cycles of chemotherapy with carboplatinum/VP-16.  He had Tecentriq along with that.  He now will had been on maintenance Tecentriq.  This is quite concerning that he has recurred/progressed so quickly.  He is still losing weight.  He actually is going for an upper endoscopy on May 19 to see what is going on and see if there is anything that can be done with respect to any stenosis and dilation.  He is able to have some liquids.  I told his wife to try to get protein powder into milkshakes to try to give him more calories.  He is not smoking.  He has had no problems with pain.  He has had no problems with fever.  There is no change in bowel or bladder habits.  Currently, overall, his performance status is probably ECOG 1-2.      Medications:  Allergies as of 10/21/2019   No Known Allergies     Medication List       Accurate as of Oct 21, 2019  8:32 AM. If you have any questions, ask your nurse or  doctor.        acetaminophen 500 MG tablet Commonly known as: TYLENOL Take 1,000 mg by mouth every 6 (six) hours as needed for moderate pain.   ALPRAZolam 0.5 MG tablet Commonly known as: XANAX Take 1 tablet (0.5 mg total) by mouth every 6 (six) hours as needed for anxiety.   dronabinol 5 MG capsule Commonly known as: MARINOL Take 1 capsule (5 mg total) by mouth 2 (two) times daily before a meal.   finasteride 5 MG tablet Commonly known as: PROSCAR TAKE 1 TABLET BY MOUTH EVERY DAY   furosemide 20 MG tablet Commonly known as: LASIX Take 2 tablets (40 mg total) by mouth daily.   lidocaine-prilocaine cream Commonly known as: EMLA Apply to affected area once   LORazepam 0.5 MG tablet Commonly known as: Ativan Take 1 tablet (0.5 mg total) by mouth every 6 (six) hours as needed (Nausea or vomiting).   memantine 5 MG tablet Commonly known as: Namenda Starting 06/18/19 Take one tab po daily. Starting 06/25/19 Take one tab po q am and one tab po q pm. Starting 1/26/21Take two tabs po q am, and one tab po q pm. Starting 07/09/19 Take two tabs po q am and two tabs po q pm until 12/03/19.   multivitamin capsule Take 1 capsule by mouth daily.   nicotine 21 mg/24hr  patch Commonly known as: NICODERM CQ - dosed in mg/24 hours PLACE 1 PATCH (21 MG TOTAL) ONTO THE SKIN DAILY.   omeprazole 40 MG capsule Commonly known as: PRILOSEC Take 1 capsule (40 mg total) by mouth 2 (two) times daily.   ondansetron 4 MG tablet Commonly known as: ZOFRAN Take 1 tablet (4 mg total) by mouth every 6 (six) hours as needed for nausea.   ondansetron 8 MG tablet Commonly known as: Zofran Take 1 tablet (8 mg total) by mouth 2 (two) times daily as needed for refractory nausea / vomiting. Start on day 3 after carboplatin chemo.   OPCON-A OP Place 2 drops into both eyes as needed (for dry eyes).   potassium chloride SA 20 MEQ tablet Commonly known as: KLOR-CON Take 1 tablet (20 mEq total) by mouth  daily.   prochlorperazine 10 MG tablet Commonly known as: COMPAZINE Take 1 tablet (10 mg total) by mouth every 6 (six) hours as needed (Nausea or vomiting).   tamsulosin 0.4 MG Caps capsule Commonly known as: FLOMAX Take 2 capsules (0.8 mg total) by mouth daily.       Allergies: No Known Allergies  Past Medical History, Surgical history, Social history, and Family History were reviewed and updated.  Review of Systems: Review of Systems  Constitutional: Positive for weight loss.  HENT: Negative.   Eyes: Negative.   Respiratory: Negative.   Cardiovascular: Negative.   Gastrointestinal: Negative.   Genitourinary: Negative.   Musculoskeletal: Negative.   Skin: Negative.   Neurological: Negative.   Endo/Heme/Allergies: Negative.   Psychiatric/Behavioral: Negative.      Physical Exam:  vitals were not taken for this visit.   Wt Readings from Last 3 Encounters:  10/03/19 144 lb 2 oz (65.4 kg)  10/01/19 144 lb (65.3 kg)  09/10/19 145 lb 1.9 oz (65.8 kg)    Physical Exam Vitals reviewed.  HENT:     Head: Normocephalic and atraumatic.  Eyes:     Pupils: Pupils are equal, round, and reactive to light.  Cardiovascular:     Rate and Rhythm: Normal rate and regular rhythm.     Heart sounds: Normal heart sounds.  Pulmonary:     Effort: Pulmonary effort is normal.     Breath sounds: Normal breath sounds.  Abdominal:     General: Bowel sounds are normal.     Palpations: Abdomen is soft.  Musculoskeletal:        General: No tenderness or deformity. Normal range of motion.     Cervical back: Normal range of motion.  Lymphadenopathy:     Cervical: No cervical adenopathy.  Skin:    General: Skin is warm and dry.     Findings: No erythema or rash.  Neurological:     Mental Status: He is alert and oriented to person, place, and time.  Psychiatric:        Behavior: Behavior normal.        Thought Content: Thought content normal.        Judgment: Judgment normal.       Lab Results  Component Value Date   WBC 15.1 (H) 10/01/2019   HGB 9.5 (L) 10/01/2019   HCT 29.1 (L) 10/01/2019   MCV 98.3 10/01/2019   PLT 354 10/01/2019   Lab Results  Component Value Date   FERRITIN 2,445 (H) 09/10/2019   IRON 41 (L) 09/10/2019   TIBC 233 09/10/2019   UIBC 192 09/10/2019   IRONPCTSAT 17 (L) 09/10/2019   Lab Results  Component Value Date   RETICCTPCT 1.5 08/07/2019   RBC 2.96 (L) 10/01/2019   No results found for: KPAFRELGTCHN, LAMBDASER, KAPLAMBRATIO No results found for: IGGSERUM, IGA, IGMSERUM No results found for: Odetta Pink, SPEI   Chemistry      Component Value Date/Time   NA 136 10/01/2019 1030   NA 142 01/30/2017 0804   K 3.7 10/01/2019 1030   K 4.1 01/30/2017 0804   CL 100 10/01/2019 1030   CL 105 01/25/2016 0926   CO2 27 10/01/2019 1030   CO2 29 01/30/2017 0804   BUN 7 (L) 10/01/2019 1030   BUN 6.9 (L) 01/30/2017 0804   CREATININE 0.65 10/01/2019 1030   CREATININE 0.8 01/30/2017 0804      Component Value Date/Time   CALCIUM 9.3 10/01/2019 1030   CALCIUM 10.5 (H) 01/30/2017 0804   ALKPHOS 105 10/01/2019 1030   ALKPHOS 79 01/30/2017 0804   AST 17 10/01/2019 1030   AST 34 01/30/2017 0804   ALT 8 10/01/2019 1030   ALT 18 01/30/2017 0804   BILITOT 0.3 10/01/2019 1030   BILITOT 1.49 (H) 01/30/2017 0804       Impression and Plan: Walter Hernandez is a very pleasant 69 yo caucasian gentleman with history of stage 1b melanoma of the left lower leg.   This really is not a problem right now.  The real problem is that he has the extensive stage small cell lung cancer.  He has had 4 cycles of chemotherapy along with immunotherapy.  We had him on maintenance immunotherapy with Tecentriq.  Clearly, he has progressed.  Thankfully, he has not progressed in the brain.  The brain MRI looks good.  I think a good option for him would be lurbinectedin (Zepzelka) along with Keytruda.  By his  molecular studies, he has a very high TMB level.  He still has a decent performance status.  I think would be reasonable to try the Lurbinectedin/Keytruda combination.  I would probably give him 3 cycles and then reevaluate him with another CT/PET scan.  I spent about 45 minutes with he and his wife.  I went over the PET scan that he had done.  I explained why we had to make a change with treatment.  I explained why the Lurbinectedin/Keytruda combination would be reasonable.  He agrees.  I do not think toxicity should be much of a problem  We will start him next week.  His blood counts are good for his upper endoscopy.  Hopefully gastroenterology will be able to help with his swallowing so we can have regular food.  I will plan to see him back at the start of his second cycle of treatment in June.   Volanda Napoleon, MD 5/17/20218:32 AM

## 2019-10-23 ENCOUNTER — Encounter: Payer: Self-pay | Admitting: Gastroenterology

## 2019-10-23 ENCOUNTER — Ambulatory Visit (AMBULATORY_SURGERY_CENTER): Payer: Medicare Other | Admitting: Gastroenterology

## 2019-10-23 ENCOUNTER — Other Ambulatory Visit: Payer: Self-pay

## 2019-10-23 VITALS — BP 124/66 | HR 68 | Temp 97.5°F | Resp 17

## 2019-10-23 DIAGNOSIS — K2270 Barrett's esophagus without dysplasia: Secondary | ICD-10-CM | POA: Diagnosis not present

## 2019-10-23 DIAGNOSIS — R1310 Dysphagia, unspecified: Secondary | ICD-10-CM | POA: Diagnosis not present

## 2019-10-23 DIAGNOSIS — K2950 Unspecified chronic gastritis without bleeding: Secondary | ICD-10-CM | POA: Diagnosis not present

## 2019-10-23 DIAGNOSIS — K219 Gastro-esophageal reflux disease without esophagitis: Secondary | ICD-10-CM

## 2019-10-23 DIAGNOSIS — K3189 Other diseases of stomach and duodenum: Secondary | ICD-10-CM

## 2019-10-23 DIAGNOSIS — R1319 Other dysphagia: Secondary | ICD-10-CM

## 2019-10-23 DIAGNOSIS — I10 Essential (primary) hypertension: Secondary | ICD-10-CM | POA: Diagnosis not present

## 2019-10-23 MED ORDER — SODIUM CHLORIDE 0.9 % IV SOLN: 500.0000 mL | Freq: Once | INTRAVENOUS | Status: DC

## 2019-10-23 NOTE — Patient Instructions (Signed)
YOU HAD AN ENDOSCOPIC PROCEDURE TODAY AT [facility]:   Refer to the procedure report that was given to you for any specific questions about what was found during the examination.  If the procedure report does not answer your questions, please call your gastroenterologist to clarify.  If you requested that your care partner not be given the details of your procedure findings, then the procedure report has been included in a sealed envelope for you to review at your convenience later.  YOU SHOULD EXPECT: Some feelings of bloating in the abdomen. Passage of more gas than usual.  Walking can help get rid of the air that was put into your GI tract during the procedure and reduce the bloating. If you had a lower endoscopy (such as a colonoscopy or flexible sigmoidoscopy) you may notice spotting of blood in your stool or on the toilet paper. If you underwent a bowel prep for your procedure, you may not have a normal bowel movement for a few days.  Please Note:  You might notice some irritation and congestion in your nose or some drainage.  This is from the oxygen used during your procedure.  There is no need for concern and it should clear up in a day or so.  SYMPTOMS TO REPORT IMMEDIATELY:     Following upper endoscopy (EGD)  Vomiting of blood or coffee ground material  New chest pain or pain under the shoulder blades  Painful or persistently difficult swallowing  New shortness of breath  Fever of 100F or higher  Black, tarry-looking stools  For urgent or emergent issues, a gastroenterologist can be reached at any hour by calling [phone]. Do not use MyChart messaging for urgent concerns.    DIET:  See DILATION DIET HANDOUT.  ACTIVITY:  You should plan to take it easy for the rest of today and you should NOT DRIVE or use heavy machinery until tomorrow (because of the sedation medicines used during the test).    FOLLOW UP: Our staff will call the number listed on your  records 48-72 hours following your procedure to check on you and address any questions or concerns that you may have regarding the information given to you following your procedure. If we do not reach you, we will leave a message.  We will attempt to reach you two times.  During this call, we will ask if you have developed any symptoms of COVID 19. If you develop any symptoms (ie: fever, flu-like symptoms, shortness of breath, cough etc.) before then, please call [phone].  If you test positive for Covid 19 in the 2 weeks post procedure, please call and report this information to [place].    If any biopsies were taken you will be contacted by phone or by letter within the next 1-3 weeks.  Please call us at [phone] if you have not heard about the biopsies in 3 weeks.    SIGNATURES/CONFIDENTIALITY: You and/or your care partner have signed paperwork which will be entered into your electronic medical record.  These signatures attest to the fact that that the information above on your After Visit Summary has been reviewed and is understood.  Full responsibility of the confidentiality of this discharge information lies with you and/or your care-partner.  INFORMATION GIVEN ON DILATION DIET,GASTRITIS,HIATAL HERNIA CONTINUE  OMEPRAZOLE 40 MG 2 TIMES DAILY FOR 2 WEEKS THEN REDUCE TO ONCE DAILY.

## 2019-10-23 NOTE — Progress Notes (Signed)
Vitals-CW Temp-JB  Pt's states no medical or surgical changes since previsit or office visit. 

## 2019-10-23 NOTE — Progress Notes (Signed)
Report to PACU, RN, vss, BBS= Clear.  

## 2019-10-23 NOTE — Op Note (Signed)
[facility] Patient Name: Walter Hernandez Procedure Date: 10/23/2019 8:40 AM MRN: [id number] Endoscopist: Jackquline Denmark , MD Age: 69 Referring MD:  Date of Birth: 06-16-1950 Gender: Male Account #: [id number] Procedure:                Upper GI endoscopy Indications:              Dysphagia Medicines:                Monitored Anesthesia Care Procedure:                Pre-Anesthesia Assessment:                           - Prior to the procedure, a History and Physical                            was performed, and patient medications and                            allergies were reviewed. The patient's tolerance of                            previous anesthesia was also reviewed. The risks                            and benefits of the procedure and the sedation                            options and risks were discussed with the patient.                            All questions were answered, and informed consent                            was obtained. Prior Anticoagulants: The patient has                            taken no previous anticoagulant or antiplatelet                            agents. ASA Grade Assessment: III - A patient with                            severe systemic disease. After reviewing the risks                            and benefits, the patient was deemed in                            satisfactory condition to undergo the procedure.                           After obtaining informed consent, the endoscope was  passed under direct vision. Throughout the                            procedure, the patient's blood pressure, pulse, and                            oxygen saturations were monitored continuously. The                            Endoscope was introduced through the mouth, and                            advanced to the second part of duodenum. The upper                            GI endoscopy was accomplished without difficulty.                             The patient tolerated the procedure well. Scope In: Scope Out: Findings:                 The lower third of the esophagus was mildly                            tortuous. Healed distal esophageal erosions at GE                            junction 40 cm from the incisors. Examined by NBI.                            Biopsies were obtained from the proximal and distal                            esophagus with cold forceps for histology. The                            scope was withdrawn. Dilation was performed with a                            Maloney dilator with mild resistance at 48 Fr and                            50 Fr.                           A small transient hiatal hernia was present.                           Localized mild inflammation characterized by                            erythema was found in the gastric antrum. Biopsies  were taken with a cold forceps for histology.                           The examined duodenum was normal. Complications:            No immediate complications. Estimated Blood Loss:     Estimated blood loss: none. Impression:               - Mildly tortuous esophagus s/o presbyesophagus s/p                            dilatation.                           - Small transient hiatal hernia.                           - Mild gastritis. Recommendation:           - Patient has a contact number available for                            emergencies. The signs and symptoms of potential                            delayed complications were discussed with the                            patient. Return to normal activities tomorrow.                            Written discharge instructions were provided to the                            patient.                           - Post dilatation diet.                           - Omeprazole 40 mg p.o. twice daily x 2 weeks, then                            can reduce it to  once a day.                           - Await pathology results.                           - The findings and recommendations were discussed                            with the patient's family. Jackquline Denmark, MD 10/23/2019 9:02:17 AM This report has been signed electronically.

## 2019-10-23 NOTE — Progress Notes (Signed)
Called to room to assist during endoscopic procedure.  Patient ID and intended procedure confirmed with present staff. Received instructions for my participation in the procedure from the performing physician.  

## 2019-10-25 ENCOUNTER — Other Ambulatory Visit: Payer: Self-pay | Admitting: *Deleted

## 2019-10-25 ENCOUNTER — Telehealth: Payer: Self-pay | Admitting: *Deleted

## 2019-10-25 DIAGNOSIS — C4372 Malignant melanoma of left lower limb, including hip: Secondary | ICD-10-CM

## 2019-10-25 DIAGNOSIS — C3490 Malignant neoplasm of unspecified part of unspecified bronchus or lung: Secondary | ICD-10-CM

## 2019-10-25 NOTE — Telephone Encounter (Signed)
Mailbox full unable to leave message.

## 2019-10-25 NOTE — Telephone Encounter (Signed)
Attempted f/u phone call. No answer. Mailbox full, unable to leave message.

## 2019-10-27 ENCOUNTER — Encounter: Payer: Self-pay | Admitting: Gastroenterology

## 2019-10-28 ENCOUNTER — Inpatient Hospital Stay: Payer: Medicare Other

## 2019-10-28 ENCOUNTER — Other Ambulatory Visit: Payer: Self-pay

## 2019-10-28 ENCOUNTER — Encounter: Payer: Self-pay | Admitting: *Deleted

## 2019-10-28 DIAGNOSIS — C3490 Malignant neoplasm of unspecified part of unspecified bronchus or lung: Secondary | ICD-10-CM

## 2019-10-28 DIAGNOSIS — C4372 Malignant melanoma of left lower limb, including hip: Secondary | ICD-10-CM | POA: Diagnosis not present

## 2019-10-28 DIAGNOSIS — Z5111 Encounter for antineoplastic chemotherapy: Secondary | ICD-10-CM | POA: Diagnosis not present

## 2019-10-28 DIAGNOSIS — Z5112 Encounter for antineoplastic immunotherapy: Secondary | ICD-10-CM | POA: Diagnosis not present

## 2019-10-28 DIAGNOSIS — C7931 Secondary malignant neoplasm of brain: Secondary | ICD-10-CM | POA: Diagnosis not present

## 2019-10-28 DIAGNOSIS — C787 Secondary malignant neoplasm of liver and intrahepatic bile duct: Secondary | ICD-10-CM | POA: Diagnosis not present

## 2019-10-28 DIAGNOSIS — C7800 Secondary malignant neoplasm of unspecified lung: Secondary | ICD-10-CM | POA: Diagnosis not present

## 2019-10-28 LAB — CMP (CANCER CENTER ONLY)
ALT: 7 U/L (ref 0–44)
AST: 14 U/L — ABNORMAL LOW (ref 15–41)
Albumin: 4 g/dL (ref 3.5–5.0)
Alkaline Phosphatase: 70 U/L (ref 38–126)
Anion gap: 8 (ref 5–15)
BUN: 8 mg/dL (ref 8–23)
CO2: 28 mmol/L (ref 22–32)
Calcium: 9.9 mg/dL (ref 8.9–10.3)
Chloride: 101 mmol/L (ref 98–111)
Creatinine: 0.62 mg/dL (ref 0.61–1.24)
GFR, Est AFR Am: >60 mL/min (ref >60)
GFR, Estimated: >60 mL/min (ref >60)
Glucose, Bld: 142 mg/dL — ABNORMAL HIGH (ref 70–99)
Potassium: 3.7 mmol/L (ref 3.5–5.1)
Sodium: 137 mmol/L (ref 135–145)
Total Bilirubin: 0.4 mg/dL (ref 0.3–1.2)
Total Protein: 6.8 g/dL (ref 6.5–8.1)

## 2019-10-28 LAB — CBC WITH DIFFERENTIAL (CANCER CENTER ONLY)
Abs Immature Granulocytes: 0.11 10*3/uL — ABNORMAL HIGH (ref 0.00–0.07)
Basophils Absolute: 0.1 10*3/uL (ref 0.0–0.1)
Basophils Relative: 1 %
Eosinophils Absolute: 0.2 10*3/uL (ref 0.0–0.5)
Eosinophils Relative: 3 %
HCT: 33.1 % — ABNORMAL LOW (ref 39.0–52.0)
Hemoglobin: 10.8 g/dL — ABNORMAL LOW (ref 13.0–17.0)
Immature Granulocytes: 1 %
Lymphocytes Relative: 16 %
Lymphs Abs: 1.3 10*3/uL (ref 0.7–4.0)
MCH: 31.9 pg (ref 26.0–34.0)
MCHC: 32.6 g/dL (ref 30.0–36.0)
MCV: 97.6 fL (ref 80.0–100.0)
Monocytes Absolute: 0.8 10*3/uL (ref 0.1–1.0)
Monocytes Relative: 10 %
Neutro Abs: 5.8 10*3/uL (ref 1.7–7.7)
Neutrophils Relative %: 69 %
Platelet Count: 242 10*3/uL (ref 150–400)
RBC: 3.39 MIL/uL — ABNORMAL LOW (ref 4.22–5.81)
RDW: 13.9 % (ref 11.5–15.5)
WBC Count: 8.2 10*3/uL (ref 4.0–10.5)
nRBC: 0 % (ref 0.0–0.2)

## 2019-10-28 MED ORDER — SODIUM CHLORIDE 0.9 % IV SOLN
10.0000 mg | Freq: Once | INTRAVENOUS | Status: AC
  Administered 2019-10-28: 10 mg via INTRAVENOUS
  Filled 2019-10-28: qty 10

## 2019-10-28 MED ORDER — SODIUM CHLORIDE 0.9% FLUSH
10.0000 mL | INTRAVENOUS | Status: DC | PRN
  Administered 2019-10-28: 10 mL
  Filled 2019-10-28: qty 10

## 2019-10-28 MED ORDER — HEPARIN SOD (PORK) LOCK FLUSH 100 UNIT/ML IV SOLN
500.0000 [IU] | Freq: Once | INTRAVENOUS | Status: AC | PRN
  Administered 2019-10-28: 500 [IU]
  Filled 2019-10-28: qty 5

## 2019-10-28 MED ORDER — PALONOSETRON HCL INJECTION 0.25 MG/5ML
0.2500 mg | Freq: Once | INTRAVENOUS | Status: AC
  Administered 2019-10-28: 0.25 mg via INTRAVENOUS

## 2019-10-28 MED ORDER — PALONOSETRON HCL INJECTION 0.25 MG/5ML
INTRAVENOUS | Status: AC
  Filled 2019-10-28: qty 5

## 2019-10-28 MED ORDER — SODIUM CHLORIDE 0.9 % IV SOLN
Freq: Once | INTRAVENOUS | Status: AC
  Filled 2019-10-28: qty 250

## 2019-10-28 MED ORDER — SODIUM CHLORIDE 0.9 % IV SOLN
200.0000 mg | Freq: Once | INTRAVENOUS | Status: AC
  Administered 2019-10-28: 200 mg via INTRAVENOUS
  Filled 2019-10-28: qty 8

## 2019-10-28 MED ORDER — SODIUM CHLORIDE 0.9 % IV SOLN
3.2000 mg/m2 | Freq: Once | INTRAVENOUS | Status: AC
  Administered 2019-10-28: 5.55 mg via INTRAVENOUS
  Filled 2019-10-28: qty 11.1

## 2019-10-28 NOTE — Patient Instructions (Signed)
[facility] Discharge Instructions for Patients Receiving Chemotherapy  Today you received the following chemotherapy agents Keytruda and Zepzelca  To help prevent nausea and vomiting after your treatment, we encourage you to take your nausea medication as prescribed by MD. **DO NOT TAKE ZOFRAN FOR 3 DAYS AFTER CHEMOTHERAPY**   If you develop nausea and vomiting that is not controlled by your nausea medication, call the clinic.   BELOW ARE SYMPTOMS THAT SHOULD BE REPORTED IMMEDIATELY:  *FEVER GREATER THAN 100.5 F  *CHILLS WITH OR WITHOUT FEVER  NAUSEA AND VOMITING THAT IS NOT CONTROLLED WITH YOUR NAUSEA MEDICATION  *UNUSUAL SHORTNESS OF BREATH  *UNUSUAL BRUISING OR BLEEDING  TENDERNESS IN MOUTH AND THROAT WITH OR WITHOUT PRESENCE OF ULCERS  *URINARY PROBLEMS  *BOWEL PROBLEMS  UNUSUAL RASH Items with * indicate a potential emergency and should be followed up as soon as possible.  Feel free to call the clinic should you have any questions or concerns. The clinic phone number is [phone].  Please show the [facility] at check-in to the Emergency Department and triage nurse.

## 2019-10-28 NOTE — Patient Instructions (Signed)

## 2019-11-05 ENCOUNTER — Encounter: Payer: Self-pay | Admitting: *Deleted

## 2019-11-05 NOTE — Progress Notes (Signed)
Oncology Nurse Navigator Documentation  Oncology Nurse Navigator Flowsheets 11/05/2019 10/28/2019 10/21/2019  Abnormal Finding Date - - -  Confirmed Diagnosis Date - - -  Diagnosis Status - - -  Planned Course of Treatment - - -  Phase of Treatment - - -  Chemotherapy Actual Start Date: - - -  Radiation Actual Start Date: - - -  Navigator Follow Up Date: 11/18/2019 11/18/2019 10/28/2019  Navigator Follow Up Reason: Follow-up Appointment;Chemotherapy Follow-up Appointment;Chemotherapy Follow-up Appointment;Chemotherapy  [facility]  Navigator Encounter Type Telephone Treatment Follow-up Appt  Telephone Incoming Call;Appt Confirmation/Clarification - -  Treatment Initiated Date - - -  Patient Visit Type MedOnc MedOnc MedOnc  Treatment Phase Active Tx Active Tx Active Tx  Barriers/Navigation Needs Coordination of Care No Barriers At This Time No Barriers At This Time  Education Other - -  Interventions Coordination of Care Psycho-Social Support Psycho-Social Support  Acuity Level 2-Minimal Needs (1-2 Barriers Identified) Level 1-No Barriers Level 2-Minimal Needs (1-2 Barriers Identified)  Referrals - - -  Coordination of Care Appts - -  Education Method Verbal - -  Support Groups/Services Friends and Family Friends and Family Friends and Family  Time Spent with Patient 15 15 30

## 2019-11-11 NOTE — Progress Notes (Signed)
Pharmacist Chemotherapy Monitoring - Follow Up Assessment    I verify that I have reviewed each item in the below checklist:  . Regimen for the patient is scheduled for the appropriate day and plan matches scheduled date. Marland Kitchen Appropriate non-routine labs are ordered dependent on drug ordered. . If applicable, additional medications reviewed and ordered per protocol based on lifetime cumulative doses and/or treatment regimen.   Plan for follow-up and/or issues identified: No . I-vent associated with next due treatment: No . MD and/or nursing notified: No  Walter Hernandez, Walter Hernandez 11/11/2019 8:16 AM

## 2019-11-12 ENCOUNTER — Ambulatory Visit: Payer: Medicare Other

## 2019-11-12 ENCOUNTER — Other Ambulatory Visit: Payer: Medicare Other

## 2019-11-12 ENCOUNTER — Ambulatory Visit: Payer: Medicare Other | Admitting: Family

## 2019-11-18 ENCOUNTER — Encounter: Payer: Self-pay | Admitting: Hematology & Oncology

## 2019-11-18 ENCOUNTER — Inpatient Hospital Stay (HOSPITAL_BASED_OUTPATIENT_CLINIC_OR_DEPARTMENT_OTHER): Payer: Medicare Other | Admitting: Hematology & Oncology

## 2019-11-18 ENCOUNTER — Inpatient Hospital Stay: Payer: Medicare Other

## 2019-11-18 ENCOUNTER — Inpatient Hospital Stay: Payer: Medicare Other | Attending: Hematology & Oncology

## 2019-11-18 ENCOUNTER — Other Ambulatory Visit: Payer: Self-pay

## 2019-11-18 ENCOUNTER — Encounter: Payer: Self-pay | Admitting: *Deleted

## 2019-11-18 VITALS — BP 114/73 | HR 100 | Temp 98.6°F | Resp 17 | Wt 150.0 lb

## 2019-11-18 DIAGNOSIS — C7931 Secondary malignant neoplasm of brain: Secondary | ICD-10-CM | POA: Diagnosis not present

## 2019-11-18 DIAGNOSIS — C3490 Malignant neoplasm of unspecified part of unspecified bronchus or lung: Secondary | ICD-10-CM

## 2019-11-18 DIAGNOSIS — Z85820 Personal history of malignant melanoma of skin: Secondary | ICD-10-CM | POA: Diagnosis not present

## 2019-11-18 DIAGNOSIS — R634 Abnormal weight loss: Secondary | ICD-10-CM | POA: Diagnosis not present

## 2019-11-18 DIAGNOSIS — Z5112 Encounter for antineoplastic immunotherapy: Secondary | ICD-10-CM | POA: Insufficient documentation

## 2019-11-18 DIAGNOSIS — Z79899 Other long term (current) drug therapy: Secondary | ICD-10-CM | POA: Diagnosis not present

## 2019-11-18 DIAGNOSIS — Z5111 Encounter for antineoplastic chemotherapy: Secondary | ICD-10-CM | POA: Insufficient documentation

## 2019-11-18 DIAGNOSIS — C787 Secondary malignant neoplasm of liver and intrahepatic bile duct: Secondary | ICD-10-CM | POA: Insufficient documentation

## 2019-11-18 DIAGNOSIS — C3431 Malignant neoplasm of lower lobe, right bronchus or lung: Secondary | ICD-10-CM | POA: Diagnosis present

## 2019-11-18 LAB — LACTATE DEHYDROGENASE: LDH: 174 U/L (ref 98–192)

## 2019-11-18 LAB — CMP (CANCER CENTER ONLY)
ALT: 8 U/L (ref 0–44)
AST: 14 U/L — ABNORMAL LOW (ref 15–41)
Albumin: 4.2 g/dL (ref 3.5–5.0)
Alkaline Phosphatase: 49 U/L (ref 38–126)
Anion gap: 9 (ref 5–15)
BUN: 10 mg/dL (ref 8–23)
CO2: 27 mmol/L (ref 22–32)
Calcium: 10 mg/dL (ref 8.9–10.3)
Chloride: 99 mmol/L (ref 98–111)
Creatinine: 0.72 mg/dL (ref 0.61–1.24)
GFR, Est AFR Am: >60 mL/min (ref >60)
GFR, Estimated: >60 mL/min (ref >60)
Glucose, Bld: 112 mg/dL — ABNORMAL HIGH (ref 70–99)
Potassium: 3.8 mmol/L (ref 3.5–5.1)
Sodium: 135 mmol/L (ref 135–145)
Total Bilirubin: 0.4 mg/dL (ref 0.3–1.2)
Total Protein: 6.7 g/dL (ref 6.5–8.1)

## 2019-11-18 LAB — CBC WITH DIFFERENTIAL (CANCER CENTER ONLY)
Abs Immature Granulocytes: 0.17 10*3/uL — ABNORMAL HIGH (ref 0.00–0.07)
Basophils Absolute: 0.1 10*3/uL (ref 0.0–0.1)
Basophils Relative: 1 %
Eosinophils Absolute: 0.1 10*3/uL (ref 0.0–0.5)
Eosinophils Relative: 1 %
HCT: 35.4 % — ABNORMAL LOW (ref 39.0–52.0)
Hemoglobin: 11.9 g/dL — ABNORMAL LOW (ref 13.0–17.0)
Immature Granulocytes: 3 %
Lymphocytes Relative: 30 %
Lymphs Abs: 1.7 10*3/uL (ref 0.7–4.0)
MCH: 32.1 pg (ref 26.0–34.0)
MCHC: 33.6 g/dL (ref 30.0–36.0)
MCV: 95.4 fL (ref 80.0–100.0)
Monocytes Absolute: 0.9 10*3/uL (ref 0.1–1.0)
Monocytes Relative: 16 %
Neutro Abs: 2.7 10*3/uL (ref 1.7–7.7)
Neutrophils Relative %: 49 %
Platelet Count: 281 10*3/uL (ref 150–400)
RBC: 3.71 MIL/uL — ABNORMAL LOW (ref 4.22–5.81)
RDW: 13.6 % (ref 11.5–15.5)
WBC Count: 5.6 10*3/uL (ref 4.0–10.5)
nRBC: 0 % (ref 0.0–0.2)

## 2019-11-18 MED ORDER — SODIUM CHLORIDE 0.9 % IV SOLN
Freq: Once | INTRAVENOUS | Status: AC
  Filled 2019-11-18: qty 250

## 2019-11-18 MED ORDER — LORAZEPAM 0.5 MG PO TABS
0.5000 mg | ORAL_TABLET | Freq: Four times a day (QID) | ORAL | 0 refills | Status: DC | PRN
Start: 2019-11-18 — End: 2020-03-24

## 2019-11-18 MED ORDER — SODIUM CHLORIDE 0.9 % IV SOLN
200.0000 mg | Freq: Once | INTRAVENOUS | Status: AC
  Administered 2019-11-18: 200 mg via INTRAVENOUS
  Filled 2019-11-18: qty 8

## 2019-11-18 MED ORDER — PALONOSETRON HCL INJECTION 0.25 MG/5ML
0.2500 mg | Freq: Once | INTRAVENOUS | Status: AC
  Administered 2019-11-18: 0.25 mg via INTRAVENOUS

## 2019-11-18 MED ORDER — HEPARIN SOD (PORK) LOCK FLUSH 100 UNIT/ML IV SOLN
500.0000 [IU] | Freq: Once | INTRAVENOUS | Status: AC | PRN
  Administered 2019-11-18: 500 [IU]
  Filled 2019-11-18: qty 5

## 2019-11-18 MED ORDER — SODIUM CHLORIDE 0.9 % IV SOLN
10.0000 mg | Freq: Once | INTRAVENOUS | Status: AC
  Administered 2019-11-18: 10 mg via INTRAVENOUS
  Filled 2019-11-18: qty 10

## 2019-11-18 MED ORDER — SODIUM CHLORIDE 0.9 % IV SOLN
3.2000 mg/m2 | Freq: Once | INTRAVENOUS | Status: AC
  Administered 2019-11-18: 5.55 mg via INTRAVENOUS
  Filled 2019-11-18: qty 11.1

## 2019-11-18 MED ORDER — SODIUM CHLORIDE 0.9% FLUSH
10.0000 mL | INTRAVENOUS | Status: DC | PRN
  Administered 2019-11-18: 10 mL
  Filled 2019-11-18: qty 10

## 2019-11-18 MED ORDER — PALONOSETRON HCL INJECTION 0.25 MG/5ML
INTRAVENOUS | Status: AC
  Filled 2019-11-18: qty 5

## 2019-11-18 MED FILL — LORazepam 0.5 MG TABS: 0.5 | 17 days supply | Qty: 60 | Fill #0

## 2019-11-18 NOTE — Progress Notes (Signed)
Hematology and Oncology Follow Up Visit  Walter Hernandez [id number] Jun 29, 1950 69 y.o. 11/18/2019   Principle Diagnosis:  Stage IB (T2aN0M0) superficial spreading melanoma of the left lower leg  Small Cell Lung Cancer -- Extensive Stage -- lung/liver/brain mets  Past Therapy: Cranial XRT -- 06/19/2019 thru 07/02/2019  Current Therapy:        Carboplatin/VP-16/Tecentriq -- started on 07/07/2018, s/p cycle #4 Tecentriq - maintenance -- cycle#1 -- start on 10/01/2019 - d/c on 10/21/2019 Zepzelca/Keytruda -- q 3 wk --s/p cycle #1 - started on 10/28/2019   Interim History:  Walter Hernandez is here today for follow-up.  Actually, he looks pretty good.  He feels okay.  He is eating a little bit better.  His weight is up 6 pounds.  He tolerated the Zepzelca quite nicely.  He had no nausea or vomiting.  He has no problems with pain.  There is no issues with bleeding.  He has had no diarrhea.  He has had no leg swelling.  There is been no problem with mouth sores.  Overall, I would say his performance status probably ECOG 1.   Medications:  Allergies as of 11/18/2019   No Known Allergies     Medication List       Accurate as of November 18, 2019 10:01 AM. If you have any questions, ask your nurse or doctor.        acetaminophen 500 MG tablet Commonly known as: TYLENOL Take 1,000 mg by mouth every 6 (six) hours as needed for moderate pain.   ALPRAZolam 0.5 MG tablet Commonly known as: XANAX Take 1 tablet (0.5 mg total) by mouth every 6 (six) hours as needed for anxiety.   dronabinol 5 MG capsule Commonly known as: MARINOL Take 1 capsule (5 mg total) by mouth 2 (two) times daily before a meal.   finasteride 5 MG tablet Commonly known as: PROSCAR TAKE 1 TABLET BY MOUTH EVERY DAY   furosemide 20 MG tablet Commonly known as: LASIX Take 2 tablets (40 mg total) by mouth daily.   memantine 5 MG tablet Commonly known as: Namenda Starting 06/18/19 Take one tab po daily. Starting 06/25/19  Take one tab po q am and one tab po q pm. Starting 1/26/21Take two tabs po q am, and one tab po q pm. Starting 07/09/19 Take two tabs po q am and two tabs po q pm until 12/03/19.   multivitamin capsule Take 1 capsule by mouth daily.   nicotine 21 mg/24hr patch Commonly known as: NICODERM CQ - dosed in mg/24 hours PLACE 1 PATCH (21 MG TOTAL) ONTO THE SKIN DAILY.   omeprazole 40 MG capsule Commonly known as: PRILOSEC Take 1 capsule (40 mg total) by mouth 2 (two) times daily.   ondansetron 4 MG tablet Commonly known as: ZOFRAN Take 1 tablet (4 mg total) by mouth every 6 (six) hours as needed for nausea.   OPCON-A OP Place 2 drops into both eyes as needed (for dry eyes).   potassium chloride SA 20 MEQ tablet Commonly known as: KLOR-CON Take 1 tablet (20 mEq total) by mouth daily.   tamsulosin 0.4 MG Caps capsule Commonly known as: FLOMAX Take 2 capsules (0.8 mg total) by mouth daily.       Allergies: No Known Allergies  Past Medical History, Surgical history, Social history, and Family History were reviewed and updated.  Review of Systems: Review of Systems  Constitutional: Positive for weight loss.  HENT: Negative.   Eyes: Negative.   Respiratory: Negative.  Cardiovascular: Negative.   Gastrointestinal: Negative.   Genitourinary: Negative.   Musculoskeletal: Negative.   Skin: Negative.   Neurological: Negative.   Endo/Heme/Allergies: Negative.   Psychiatric/Behavioral: Negative.      Physical Exam:  weight is 150 lb (68 kg). His other (comment) temperature is 98.6 F (37 C). His blood pressure is 114/73 and his pulse is 100. His respiration is 17 and oxygen saturation is 100%.   Wt Readings from Last 3 Encounters:  11/18/19 150 lb (68 kg)  10/21/19 142 lb (64.4 kg)  10/03/19 144 lb 2 oz (65.4 kg)    Physical Exam Vitals reviewed.  HENT:     Head: Normocephalic and atraumatic.  Eyes:     Pupils: Pupils are equal, round, and reactive to light.    Cardiovascular:     Rate and Rhythm: Normal rate and regular rhythm.     Heart sounds: Normal heart sounds.  Pulmonary:     Effort: Pulmonary effort is normal.     Breath sounds: Normal breath sounds.  Abdominal:     General: Bowel sounds are normal.     Palpations: Abdomen is soft.  Musculoskeletal:        General: No tenderness or deformity. Normal range of motion.     Cervical back: Normal range of motion.  Lymphadenopathy:     Cervical: No cervical adenopathy.  Skin:    General: Skin is warm and dry.     Findings: No erythema or rash.  Neurological:     Mental Status: He is alert and oriented to person, place, and time.  Psychiatric:        Behavior: Behavior normal.        Thought Content: Thought content normal.        Judgment: Judgment normal.      Lab Results  Component Value Date   WBC 5.6 11/18/2019   HGB 11.9 (L) 11/18/2019   HCT 35.4 (L) 11/18/2019   MCV 95.4 11/18/2019   PLT 281 11/18/2019   Lab Results  Component Value Date   FERRITIN 2,549 (H) 10/21/2019   IRON 43 10/21/2019   TIBC 225 10/21/2019   UIBC 182 10/21/2019   IRONPCTSAT 19 (L) 10/21/2019   Lab Results  Component Value Date   RETICCTPCT 1.5 08/07/2019   RBC 3.71 (L) 11/18/2019   No results found for: KPAFRELGTCHN, LAMBDASER, KAPLAMBRATIO No results found for: IGGSERUM, IGA, IGMSERUM No results found for: Kathrynn Ducking, MSPIKE, SPEI   Chemistry      Component Value Date/Time   NA 135 11/18/2019 0918   NA 142 01/30/2017 0804   K 3.8 11/18/2019 0918   K 4.1 01/30/2017 0804   CL 99 11/18/2019 0918   CL 105 01/25/2016 0926   CO2 27 11/18/2019 0918   CO2 29 01/30/2017 0804   BUN 10 11/18/2019 0918   BUN 6.9 (L) 01/30/2017 0804   CREATININE 0.72 11/18/2019 0918   CREATININE 0.8 01/30/2017 0804      Component Value Date/Time   CALCIUM 10.0 11/18/2019 0918   CALCIUM 10.5 (H) 01/30/2017 0804   ALKPHOS 49 11/18/2019 0918   ALKPHOS 79  01/30/2017 0804   AST 14 (L) 11/18/2019 0918   AST 34 01/30/2017 0804   ALT 8 11/18/2019 0918   ALT 18 01/30/2017 0804   BILITOT 0.4 11/18/2019 0918   BILITOT 1.49 (H) 01/30/2017 0804       Impression and Plan: Walter Hernandez is a very pleasant 69 yo  caucasian gentleman with history of stage 1b melanoma of the left lower leg.   This really is not a problem right now.  The real problem is that he has the extensive stage small cell lung cancer.  He has had 4 cycles of chemotherapy along with immunotherapy.  We had him on maintenance immunotherapy with Tecentriq.  We will go ahead with his second cycle of Zepzelca.  I probably will try 3 cycles and then repeat a scan to see how he is doing.  I am just glad that his quality of life is doing better.  We will get him back in 3 weeks.   Volanda Napoleon, MD 6/14/202110:01 AM

## 2019-11-18 NOTE — Patient Instructions (Signed)
Pembrolizumab injection What is this medicine? PEMBROLIZUMAB (pem broe liz ue mab) is a monoclonal antibody. It is used to treat certain types of cancer. This medicine may be used for other purposes; ask your health care provider or pharmacist if you have questions. COMMON BRAND NAME(S): Keytruda What should I tell my health care provider before I take this medicine? They need to know if you have any of these conditions:  diabetes  immune system problems  inflammatory bowel disease  liver disease  lung or breathing disease  lupus  received or scheduled to receive an organ transplant or a stem-cell transplant that uses donor stem cells  an unusual or allergic reaction to pembrolizumab, other medicines, foods, dyes, or preservatives  pregnant or trying to get pregnant  breast-feeding How should I use this medicine? This medicine is for infusion into a vein. It is given by a health care professional in a hospital or clinic setting. A special MedGuide will be given to you before each treatment. Be sure to read this information carefully each time. Talk to your pediatrician regarding the use of this medicine in children. While this drug may be prescribed for children as young as 6 months for selected conditions, precautions do apply. Overdosage: If you think you have taken too much of this medicine contact a poison control center or emergency room at once. NOTE: This medicine is only for you. Do not share this medicine with others. What if I miss a dose? It is important not to miss your dose. Call your doctor or health care professional if you are unable to keep an appointment. What may interact with this medicine? Interactions have not been studied. Give your health care provider a list of all the medicines, herbs, non-prescription drugs, or dietary supplements you use. Also tell them if you smoke, drink alcohol, or use illegal drugs. Some items may interact with your medicine. This  list may not describe all possible interactions. Give your health care provider a list of all the medicines, herbs, non-prescription drugs, or dietary supplements you use. Also tell them if you smoke, drink alcohol, or use illegal drugs. Some items may interact with your medicine. What should I watch for while using this medicine? Your condition will be monitored carefully while you are receiving this medicine. You may need blood work done while you are taking this medicine. Do not become pregnant while taking this medicine or for 4 months after stopping it. Women should inform their doctor if they wish to become pregnant or think they might be pregnant. There is a potential for serious side effects to an unborn child. Talk to your health care professional or pharmacist for more information. Do not breast-feed an infant while taking this medicine or for 4 months after the last dose. What side effects may I notice from receiving this medicine? Side effects that you should report to your doctor or health care professional as soon as possible:  allergic reactions like skin rash, itching or hives, swelling of the face, lips, or tongue  bloody or black, tarry  breathing problems  changes in vision  chest pain  chills  confusion  constipation  cough  diarrhea  dizziness or feeling faint or lightheaded  fast or irregular heartbeat  fever  flushing  joint pain  low blood counts - this medicine may decrease the number of white blood cells, red blood cells and platelets. You may be at increased risk for infections and bleeding.  muscle pain  muscle   weakness  pain, tingling, numbness in the hands or feet  persistent headache  redness, blistering, peeling or loosening of the skin, including inside the mouth  signs and symptoms of high blood sugar such as dizziness; dry mouth; dry skin; fruity breath; nausea; stomach pain; increased hunger or thirst; increased urination  signs  and symptoms of kidney injury like trouble passing urine or change in the amount of urine  signs and symptoms of liver injury like dark urine, light-colored stools, loss of appetite, nausea, right upper belly pain, yellowing of the eyes or skin  sweating  swollen lymph nodes  weight loss Side effects that usually do not require medical attention (report to your doctor or health care professional if they continue or are bothersome):  decreased appetite  hair loss  muscle pain  tiredness This list may not describe all possible side effects. Call your doctor for medical advice about side effects. You may report side effects to FDA at 1-800-FDA-1088. Where should I keep my medicine? This drug is given in a hospital or clinic and will not be stored at home. NOTE: This sheet is a summary. It may not cover all possible information. If you have questions about this medicine, talk to your doctor, pharmacist, or health care provider.  2020 Elsevier/Gold Standard (2019-03-29 18:07:58)  

## 2019-11-18 NOTE — Patient Instructions (Signed)

## 2019-11-18 NOTE — Addendum Note (Signed)
Addended by: Burney Gauze R on: 11/18/2019 11:53 AM   Modules accepted: Orders

## 2019-11-18 NOTE — Progress Notes (Signed)
Oncology Nurse Navigator Documentation  Oncology Nurse Navigator Flowsheets 11/18/2019  Abnormal Finding Date -  Confirmed Diagnosis Date -  Diagnosis Status -  Planned Course of Treatment -  Phase of Treatment -  Chemotherapy Actual Start Date: -  Radiation Actual Start Date: -  Navigator Follow Up Date: 12/10/2019  Navigator Follow Up Reason: Follow-up Appointment;Chemotherapy  Navigator Location [facility]  Navigator Encounter Type Treatment;Appt/Treatment Plan Review  Telephone -  Treatment Initiated Date -  Patient Visit Type MedOnc  Treatment Phase Active Tx  Barriers/Navigation Needs No Barriers At This Time  Education -  Interventions Psycho-Social Support  Acuity Level 2-Minimal Needs (1-2 Barriers Identified)  Referrals -  Coordination of Care -  Education Method -  Support Groups/Services Friends and Family  Time Spent with Patient 15

## 2019-11-27 ENCOUNTER — Other Ambulatory Visit: Payer: Self-pay | Admitting: *Deleted

## 2019-11-27 DIAGNOSIS — C7931 Secondary malignant neoplasm of brain: Secondary | ICD-10-CM

## 2019-12-03 ENCOUNTER — Other Ambulatory Visit: Payer: Medicare Other

## 2019-12-03 ENCOUNTER — Ambulatory Visit: Payer: Medicare Other

## 2019-12-03 ENCOUNTER — Ambulatory Visit: Payer: Medicare Other | Admitting: Hematology & Oncology

## 2019-12-10 ENCOUNTER — Inpatient Hospital Stay (HOSPITAL_BASED_OUTPATIENT_CLINIC_OR_DEPARTMENT_OTHER): Payer: Medicare Other | Admitting: Hematology & Oncology

## 2019-12-10 ENCOUNTER — Other Ambulatory Visit: Payer: Medicare Other

## 2019-12-10 ENCOUNTER — Other Ambulatory Visit: Payer: Self-pay

## 2019-12-10 ENCOUNTER — Ambulatory Visit: Payer: Medicare Other

## 2019-12-10 ENCOUNTER — Inpatient Hospital Stay: Payer: Medicare Other

## 2019-12-10 ENCOUNTER — Encounter: Payer: Self-pay | Admitting: Hematology & Oncology

## 2019-12-10 ENCOUNTER — Inpatient Hospital Stay: Payer: Medicare Other | Attending: Hematology & Oncology

## 2019-12-10 VITALS — BP 136/81 | HR 84 | Temp 98.3°F | Resp 18 | Wt 157.0 lb

## 2019-12-10 DIAGNOSIS — C3490 Malignant neoplasm of unspecified part of unspecified bronchus or lung: Secondary | ICD-10-CM | POA: Diagnosis not present

## 2019-12-10 DIAGNOSIS — Z5112 Encounter for antineoplastic immunotherapy: Secondary | ICD-10-CM | POA: Insufficient documentation

## 2019-12-10 DIAGNOSIS — C4372 Malignant melanoma of left lower limb, including hip: Secondary | ICD-10-CM | POA: Diagnosis not present

## 2019-12-10 DIAGNOSIS — Z5111 Encounter for antineoplastic chemotherapy: Secondary | ICD-10-CM | POA: Diagnosis not present

## 2019-12-10 DIAGNOSIS — R634 Abnormal weight loss: Secondary | ICD-10-CM | POA: Diagnosis not present

## 2019-12-10 DIAGNOSIS — C7931 Secondary malignant neoplasm of brain: Secondary | ICD-10-CM | POA: Insufficient documentation

## 2019-12-10 DIAGNOSIS — Z79899 Other long term (current) drug therapy: Secondary | ICD-10-CM | POA: Insufficient documentation

## 2019-12-10 DIAGNOSIS — C3431 Malignant neoplasm of lower lobe, right bronchus or lung: Secondary | ICD-10-CM | POA: Insufficient documentation

## 2019-12-10 DIAGNOSIS — R635 Abnormal weight gain: Secondary | ICD-10-CM | POA: Insufficient documentation

## 2019-12-10 DIAGNOSIS — C787 Secondary malignant neoplasm of liver and intrahepatic bile duct: Secondary | ICD-10-CM | POA: Diagnosis not present

## 2019-12-10 LAB — CMP (CANCER CENTER ONLY)
ALT: 8 U/L (ref 0–44)
AST: 13 U/L — ABNORMAL LOW (ref 15–41)
Albumin: 4.4 g/dL (ref 3.5–5.0)
Alkaline Phosphatase: 54 U/L (ref 38–126)
Anion gap: 9 (ref 5–15)
BUN: 9 mg/dL (ref 8–23)
CO2: 27 mmol/L (ref 22–32)
Calcium: 9.7 mg/dL (ref 8.9–10.3)
Chloride: 102 mmol/L (ref 98–111)
Creatinine: 0.81 mg/dL (ref 0.61–1.24)
GFR, Est AFR Am: >60 mL/min (ref >60)
GFR, Estimated: >60 mL/min (ref >60)
Glucose, Bld: 103 mg/dL — ABNORMAL HIGH (ref 70–99)
Potassium: 4 mmol/L (ref 3.5–5.1)
Sodium: 138 mmol/L (ref 135–145)
Total Bilirubin: 0.5 mg/dL (ref 0.3–1.2)
Total Protein: 6.8 g/dL (ref 6.5–8.1)

## 2019-12-10 LAB — CBC WITH DIFFERENTIAL (CANCER CENTER ONLY)
Abs Immature Granulocytes: 0.15 K/uL — ABNORMAL HIGH (ref 0.00–0.07)
Basophils Absolute: 0 K/uL (ref 0.0–0.1)
Basophils Relative: 1 %
Eosinophils Absolute: 0.1 K/uL (ref 0.0–0.5)
Eosinophils Relative: 1 %
HCT: 33.4 % — ABNORMAL LOW (ref 39.0–52.0)
Hemoglobin: 11.3 g/dL — ABNORMAL LOW (ref 13.0–17.0)
Immature Granulocytes: 3 %
Lymphocytes Relative: 28 %
Lymphs Abs: 1.5 K/uL (ref 0.7–4.0)
MCH: 32.5 pg (ref 26.0–34.0)
MCHC: 33.8 g/dL (ref 30.0–36.0)
MCV: 96 fL (ref 80.0–100.0)
Monocytes Absolute: 1.1 K/uL — ABNORMAL HIGH (ref 0.1–1.0)
Monocytes Relative: 19 %
Neutro Abs: 2.8 K/uL (ref 1.7–7.7)
Neutrophils Relative %: 48 %
Platelet Count: 326 K/uL (ref 150–400)
RBC: 3.48 MIL/uL — ABNORMAL LOW (ref 4.22–5.81)
RDW: 14.7 % (ref 11.5–15.5)
WBC Count: 5.6 K/uL (ref 4.0–10.5)
nRBC: 0 % (ref 0.0–0.2)

## 2019-12-10 LAB — LACTATE DEHYDROGENASE: LDH: 190 U/L (ref 98–192)

## 2019-12-10 MED ORDER — SODIUM CHLORIDE 0.9 % IV SOLN
Freq: Once | INTRAVENOUS | Status: AC
  Filled 2019-12-10: qty 250

## 2019-12-10 MED ORDER — PALONOSETRON HCL INJECTION 0.25 MG/5ML
INTRAVENOUS | Status: AC
  Filled 2019-12-10: qty 5

## 2019-12-10 MED ORDER — SODIUM CHLORIDE 0.9 % IV SOLN
200.0000 mg | Freq: Once | INTRAVENOUS | Status: AC
  Administered 2019-12-10: 200 mg via INTRAVENOUS
  Filled 2019-12-10: qty 8

## 2019-12-10 MED ORDER — SODIUM CHLORIDE 0.9% FLUSH
10.0000 mL | INTRAVENOUS | Status: DC | PRN
  Administered 2019-12-10: 10 mL
  Filled 2019-12-10: qty 10

## 2019-12-10 MED ORDER — SODIUM CHLORIDE 0.9 % IV SOLN
3.2000 mg/m2 | Freq: Once | INTRAVENOUS | Status: AC
  Administered 2019-12-10: 5.55 mg via INTRAVENOUS
  Filled 2019-12-10: qty 11.1

## 2019-12-10 MED ORDER — HEPARIN SOD (PORK) LOCK FLUSH 100 UNIT/ML IV SOLN
500.0000 [IU] | Freq: Once | INTRAVENOUS | Status: AC | PRN
  Administered 2019-12-10: 500 [IU]
  Filled 2019-12-10: qty 5

## 2019-12-10 MED ORDER — PALONOSETRON HCL INJECTION 0.25 MG/5ML
0.2500 mg | Freq: Once | INTRAVENOUS | Status: AC
  Administered 2019-12-10: 0.25 mg via INTRAVENOUS

## 2019-12-10 MED ORDER — SODIUM CHLORIDE 0.9 % IV SOLN
10.0000 mg | Freq: Once | INTRAVENOUS | Status: AC
  Administered 2019-12-10: 10 mg via INTRAVENOUS
  Filled 2019-12-10: qty 10

## 2019-12-10 NOTE — Progress Notes (Signed)
Hematology and Oncology Follow Up Visit  Walter Hernandez [id number] 1951/02/22 69 y.o. 12/10/2019   Principle Diagnosis:  Stage IB (T2aN0M0) superficial spreading melanoma of the left lower leg  Small Cell Lung Cancer -- Extensive Stage -- lung/liver/brain mets  Past Therapy: Cranial XRT -- 06/19/2019 thru 07/02/2019  Current Therapy:        Carboplatin/VP-16/Tecentriq -- started on 07/07/2018, s/p cycle #4 Tecentriq - maintenance -- cycle#1 -- start on 10/01/2019 - d/c on 10/21/2019 Zepzelca/Keytruda -- q 3 wk --s/p cycle #2 - started on 10/28/2019   Interim History:  Walter Hernandez is here today for follow-up.  He really looks fantastic.  He had a wonderful July 4 weekend.  He is eating well.  He does not have any problems with pain.  He is gaining weight.  He gained 7 pounds since we last saw him.  He has had no problems with bowels or bladder.  There is no diarrhea.  He has had no bleeding.  There has been no rashes.  He has had a little bit of a cough which is dry.  There has been no headache.  He has had no weakness.  Overall, his performance status is ECOG 1.   Medications:  Allergies as of 12/10/2019   No Known Allergies     Medication List       Accurate as of December 10, 2019  9:28 AM. If you have any questions, ask your nurse or doctor.        acetaminophen 500 MG tablet Commonly known as: TYLENOL Take 1,000 mg by mouth every 6 (six) hours as needed for moderate pain.   ALPRAZolam 0.5 MG tablet Commonly known as: XANAX Take 1 tablet (0.5 mg total) by mouth every 6 (six) hours as needed for anxiety.   dronabinol 5 MG capsule Commonly known as: MARINOL Take 1 capsule (5 mg total) by mouth 2 (two) times daily before a meal.   finasteride 5 MG tablet Commonly known as: PROSCAR TAKE 1 TABLET BY MOUTH EVERY DAY   furosemide 20 MG tablet Commonly known as: LASIX Take 2 tablets (40 mg total) by mouth daily.   LORazepam 0.5 MG tablet Commonly known as: ATIVAN Take 1  tablet (0.5 mg total) by mouth every 6 (six) hours as needed for anxiety.   memantine 5 MG tablet Commonly known as: Namenda Starting 06/18/19 Take one tab po daily. Starting 06/25/19 Take one tab po q am and one tab po q pm. Starting 1/26/21Take two tabs po q am, and one tab po q pm. Starting 07/09/19 Take two tabs po q am and two tabs po q pm until 12/03/19.   multivitamin capsule Take 1 capsule by mouth daily.   nicotine 21 mg/24hr patch Commonly known as: NICODERM CQ - dosed in mg/24 hours PLACE 1 PATCH (21 MG TOTAL) ONTO THE SKIN DAILY.   omeprazole 40 MG capsule Commonly known as: PRILOSEC Take 1 capsule (40 mg total) by mouth 2 (two) times daily.   ondansetron 4 MG tablet Commonly known as: ZOFRAN Take 1 tablet (4 mg total) by mouth every 6 (six) hours as needed for nausea.   OPCON-A OP Place 2 drops into both eyes as needed (for dry eyes).   potassium chloride SA 20 MEQ tablet Commonly known as: KLOR-CON Take 1 tablet (20 mEq total) by mouth daily.   tamsulosin 0.4 MG Caps capsule Commonly known as: FLOMAX Take 2 capsules (0.8 mg total) by mouth daily.  Allergies: No Known Allergies  Past Medical History, Surgical history, Social history, and Family History were reviewed and updated.  Review of Systems: Review of Systems  Constitutional: Positive for weight loss.  HENT: Negative.   Eyes: Negative.   Respiratory: Negative.   Cardiovascular: Negative.   Gastrointestinal: Negative.   Genitourinary: Negative.   Musculoskeletal: Negative.   Skin: Negative.   Neurological: Negative.   Endo/Heme/Allergies: Negative.   Psychiatric/Behavioral: Negative.      Physical Exam:  vitals were not taken for this visit.   Wt Readings from Last 3 Encounters:  11/18/19 150 lb (68 kg)  10/21/19 142 lb (64.4 kg)  10/03/19 144 lb 2 oz (65.4 kg)    Physical Exam Vitals reviewed.  HENT:     Head: Normocephalic and atraumatic.  Eyes:     Pupils: Pupils are equal,  round, and reactive to light.  Cardiovascular:     Rate and Rhythm: Normal rate and regular rhythm.     Heart sounds: Normal heart sounds.  Pulmonary:     Effort: Pulmonary effort is normal.     Breath sounds: Normal breath sounds.  Abdominal:     General: Bowel sounds are normal.     Palpations: Abdomen is soft.  Musculoskeletal:        General: No tenderness or deformity. Normal range of motion.     Cervical back: Normal range of motion.  Lymphadenopathy:     Cervical: No cervical adenopathy.  Skin:    General: Skin is warm and dry.     Findings: No erythema or rash.  Neurological:     Mental Status: He is alert and oriented to person, place, and time.  Psychiatric:        Behavior: Behavior normal.        Thought Content: Thought content normal.        Judgment: Judgment normal.      Lab Results  Component Value Date   WBC 5.6 12/10/2019   HGB 11.3 (L) 12/10/2019   HCT 33.4 (L) 12/10/2019   MCV 96.0 12/10/2019   PLT 326 12/10/2019   Lab Results  Component Value Date   FERRITIN 2,549 (H) 10/21/2019   IRON 43 10/21/2019   TIBC 225 10/21/2019   UIBC 182 10/21/2019   IRONPCTSAT 19 (L) 10/21/2019   Lab Results  Component Value Date   RETICCTPCT 1.5 08/07/2019   RBC 3.48 (L) 12/10/2019   No results found for: KPAFRELGTCHN, LAMBDASER, KAPLAMBRATIO No results found for: IGGSERUM, IGA, IGMSERUM No results found for: Odetta Pink, SPEI   Chemistry      Component Value Date/Time   NA 135 11/18/2019 0918   NA 142 01/30/2017 0804   K 3.8 11/18/2019 0918   K 4.1 01/30/2017 0804   CL 99 11/18/2019 0918   CL 105 01/25/2016 0926   CO2 27 11/18/2019 0918   CO2 29 01/30/2017 0804   BUN 10 11/18/2019 0918   BUN 6.9 (L) 01/30/2017 0804   CREATININE 0.72 11/18/2019 0918   CREATININE 0.8 01/30/2017 0804      Component Value Date/Time   CALCIUM 10.0 11/18/2019 0918   CALCIUM 10.5 (H) 01/30/2017 0804   ALKPHOS 49  11/18/2019 0918   ALKPHOS 79 01/30/2017 0804   AST 14 (L) 11/18/2019 0918   AST 34 01/30/2017 0804   ALT 8 11/18/2019 0918   ALT 18 01/30/2017 0804   BILITOT 0.4 11/18/2019 0918   BILITOT 1.49 (H) 01/30/2017 0804  Impression and Plan: Walter Hernandez is a very pleasant 69 yo caucasian gentleman with history of stage 1b melanoma of the left lower leg.   This really is not a problem right now.  The real problem is that he has the extensive stage small cell lung cancer.  He has had 4 cycles of chemotherapy along with immunotherapy.  We had him on maintenance immunotherapy with Tecentriq.  We will go ahead with his third cycle of Zepzelca.  After this cycle, we will do a PET scan on him and see how everything looks.  I have to believe that the malignancy is going to be improved.  He just looks a lot better.  His weight is going up so this usually is a very good sign for [place].  We will get him back in 3 weeks.   Volanda Napoleon, MD 7/6/20219:28 AM

## 2019-12-10 NOTE — Patient Instructions (Signed)

## 2019-12-10 NOTE — Patient Instructions (Signed)
Pembrolizumab injection What is this medicine? PEMBROLIZUMAB (pem broe liz ue mab) is a monoclonal antibody. It is used to treat certain types of cancer. This medicine may be used for other purposes; ask your health care provider or pharmacist if you have questions. COMMON BRAND NAME(S): Keytruda What should I tell my health care provider before I take this medicine? They need to know if you have any of these conditions:  diabetes  immune system problems  inflammatory bowel disease  liver disease  lung or breathing disease  lupus  received or scheduled to receive an organ transplant or a stem-cell transplant that uses donor stem cells  an unusual or allergic reaction to pembrolizumab, other medicines, foods, dyes, or preservatives  pregnant or trying to get pregnant  breast-feeding How should I use this medicine? This medicine is for infusion into a vein. It is given by a health care professional in a hospital or clinic setting. A special MedGuide will be given to you before each treatment. Be sure to read this information carefully each time. Talk to your pediatrician regarding the use of this medicine in children. While this drug may be prescribed for children as young as 6 months for selected conditions, precautions do apply. Overdosage: If you think you have taken too much of this medicine contact a poison control center or emergency room at once. NOTE: This medicine is only for you. Do not share this medicine with others. What if I miss a dose? It is important not to miss your dose. Call your doctor or health care professional if you are unable to keep an appointment. What may interact with this medicine? Interactions have not been studied. Give your health care provider a list of all the medicines, herbs, non-prescription drugs, or dietary supplements you use. Also tell them if you smoke, drink alcohol, or use illegal drugs. Some items may interact with your medicine. This  list may not describe all possible interactions. Give your health care provider a list of all the medicines, herbs, non-prescription drugs, or dietary supplements you use. Also tell them if you smoke, drink alcohol, or use illegal drugs. Some items may interact with your medicine. What should I watch for while using this medicine? Your condition will be monitored carefully while you are receiving this medicine. You may need blood work done while you are taking this medicine. Do not become pregnant while taking this medicine or for 4 months after stopping it. Women should inform their doctor if they wish to become pregnant or think they might be pregnant. There is a potential for serious side effects to an unborn child. Talk to your health care professional or pharmacist for more information. Do not breast-feed an infant while taking this medicine or for 4 months after the last dose. What side effects may I notice from receiving this medicine? Side effects that you should report to your doctor or health care professional as soon as possible:  allergic reactions like skin rash, itching or hives, swelling of the face, lips, or tongue  bloody or black, tarry  breathing problems  changes in vision  chest pain  chills  confusion  constipation  cough  diarrhea  dizziness or feeling faint or lightheaded  fast or irregular heartbeat  fever  flushing  joint pain  low blood counts - this medicine may decrease the number of white blood cells, red blood cells and platelets. You may be at increased risk for infections and bleeding.  muscle pain  muscle   weakness  pain, tingling, numbness in the hands or feet  persistent headache  redness, blistering, peeling or loosening of the skin, including inside the mouth  signs and symptoms of high blood sugar such as dizziness; dry mouth; dry skin; fruity breath; nausea; stomach pain; increased hunger or thirst; increased urination  signs  and symptoms of kidney injury like trouble passing urine or change in the amount of urine  signs and symptoms of liver injury like dark urine, light-colored stools, loss of appetite, nausea, right upper belly pain, yellowing of the eyes or skin  sweating  swollen lymph nodes  weight loss Side effects that usually do not require medical attention (report to your doctor or health care professional if they continue or are bothersome):  decreased appetite  hair loss  muscle pain  tiredness This list may not describe all possible side effects. Call your doctor for medical advice about side effects. You may report side effects to FDA at 1-800-FDA-1088. Where should I keep my medicine? This drug is given in a hospital or clinic and will not be stored at home. NOTE: This sheet is a summary. It may not cover all possible information. If you have questions about this medicine, talk to your doctor, pharmacist, or health care provider.  2020 Elsevier/Gold Standard (2019-03-29 18:07:58)  

## 2019-12-16 ENCOUNTER — Encounter: Payer: Self-pay | Admitting: *Deleted

## 2019-12-16 NOTE — Progress Notes (Signed)
Patient requires PET scan prior to next treatment. Scheduled for 12/23/2019  Oncology Nurse Navigator Documentation  Oncology Nurse Navigator Flowsheets 12/16/2019  Abnormal Finding Date -  Confirmed Diagnosis Date -  Diagnosis Status -  Planned Course of Treatment -  Phase of Treatment -  Chemotherapy Actual Start Date: -  Radiation Actual Start Date: -  Navigator Follow Up Date: 12/30/2019  Navigator Follow Up Reason: Follow-up Appointment;Chemotherapy  Navigator Restaurant manager, [employer] food Encounter Type Appt/Treatment Plan Review  Telephone -  Treatment Initiated Date -  Patient Visit Type MedOnc  Treatment Phase Active Tx  Barriers/Navigation Needs No Barriers At This Time  Education -  Interventions -  Acuity Level 2-Minimal Needs (1-2 Barriers Identified)  Referrals -  Coordination of Care -  Education Method -  Support Groups/Services Friends and Family  Time Spent with Patient 15

## 2019-12-23 ENCOUNTER — Other Ambulatory Visit: Payer: Self-pay

## 2019-12-23 ENCOUNTER — Ambulatory Visit (HOSPITAL_COMMUNITY)
Admission: RE | Admit: 2019-12-23 | Discharge: 2019-12-23 | Disposition: A | Discharge status: Home / Self Care | Payer: Medicare Other | Source: Ambulatory Visit | Attending: Hematology & Oncology | Admitting: Hematology & Oncology

## 2019-12-23 DIAGNOSIS — J439 Emphysema, unspecified: Secondary | ICD-10-CM | POA: Insufficient documentation

## 2019-12-23 DIAGNOSIS — Z5111 Encounter for antineoplastic chemotherapy: Secondary | ICD-10-CM | POA: Diagnosis not present

## 2019-12-23 DIAGNOSIS — N400 Enlarged prostate without lower urinary tract symptoms: Secondary | ICD-10-CM | POA: Insufficient documentation

## 2019-12-23 DIAGNOSIS — I700 Atherosclerosis of aorta: Secondary | ICD-10-CM | POA: Insufficient documentation

## 2019-12-23 DIAGNOSIS — I2510 Atherosclerotic heart disease of native coronary artery without angina pectoris: Secondary | ICD-10-CM | POA: Diagnosis not present

## 2019-12-23 DIAGNOSIS — C3490 Malignant neoplasm of unspecified part of unspecified bronchus or lung: Secondary | ICD-10-CM

## 2019-12-23 LAB — GLUCOSE, CAPILLARY: Glucose-Capillary: 99 mg/dL (ref 70–99)

## 2019-12-23 IMAGING — PT NM PET TUM IMG RESTAG (PS) SKULL BASE T - THIGH
9 series · 19 of 25 positions shown · non-contrast
Comparison: [DATE] PET-CT.

CLINICAL DATA: Subsequent treatment strategy for extensive stage
small cell lung carcinoma status post 3 cycles chemotherapy.

EXAM:
NUCLEAR MEDICINE PET SKULL BASE TO THIGH
TECHNIQUE: 8.2 mCi F-18 FDG was injected intravenously. Full-ring PET imaging
was performed from the skull base to thigh after the radiotracer. CT
data was obtained and used for attenuation correction and anatomic
localization.
Fasting blood glucose: 99 mg/dl

[Series 3: pet sk_thigh ac · axial · 5.0mm · 4.07mm/px · z∈[-250,+630]mm · 3 of 221 slices shown]
[im 1/221]
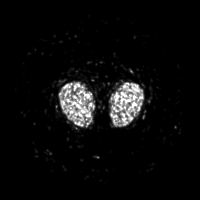
[im 74/221]
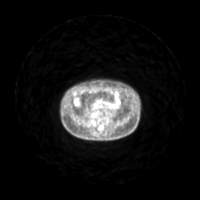
[im 221/221]
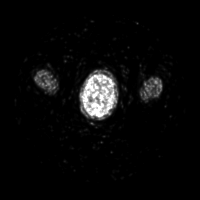

[Series 4: ct sk_thigh 5.0 b31f · axial · 5.0mm · 0.96mm/px · z∈[-250,+630]mm · 3 of 221 slices shown]
[im 1/221]
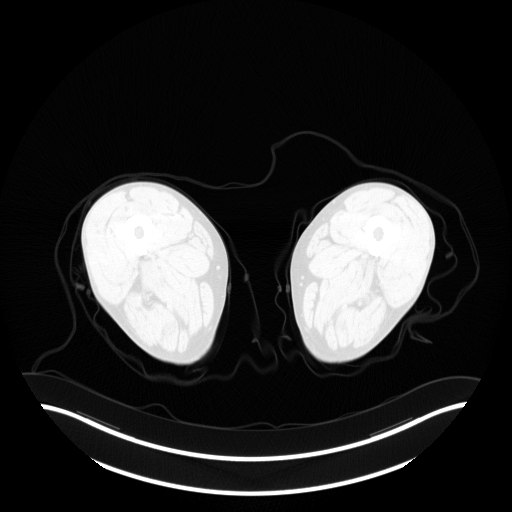
[im 74/221]
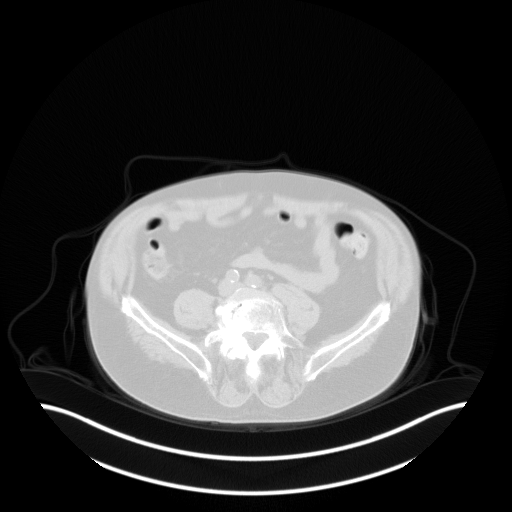
[im 221/221  brain]
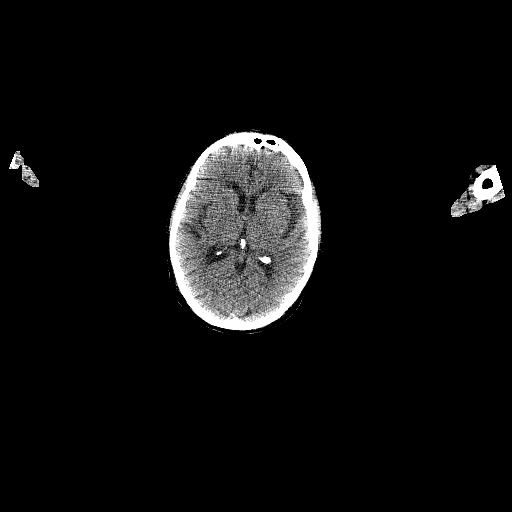

[Series 5: pet sk_thigh nac · axial · 5.0mm · 4.07mm/px · z∈[-250,+630]mm · 4 of 221 slices shown]
[im 1/221]
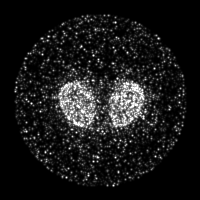
[im 56/221]
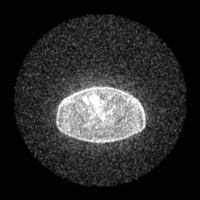
[im 166/221]
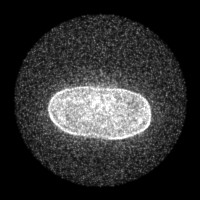
[im 221/221]
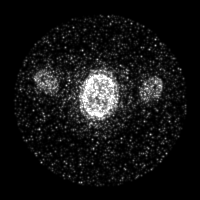

[Series 8: ct sk_thigh 5.0 (id) lung_bone · axial · 5.0mm · 0.73mm/px · 1 of 68 slices shown]
[im 1/68  bone]
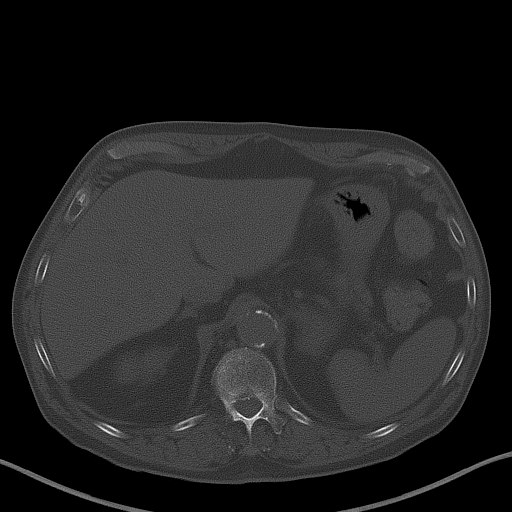

[Series 603: range-ct sk_thigh 5.0 (id)<alpha range> · 2 of 88 slices shown (1 of 2)]
[im 1/88]
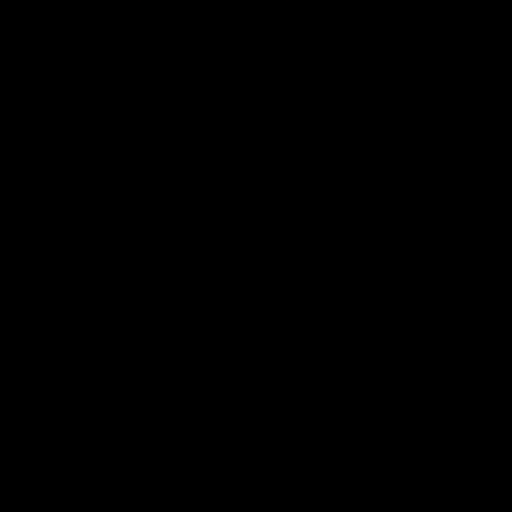
[im 88/88]
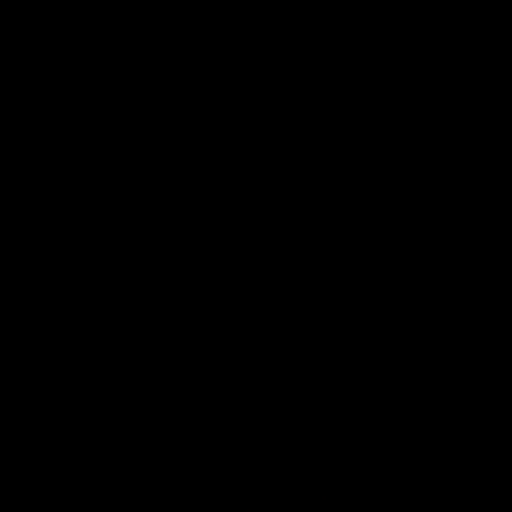

[Series 604: mip range 4 · coronal · 1.83mm/px · 1 of 32 slices shown]
[im 1/32]
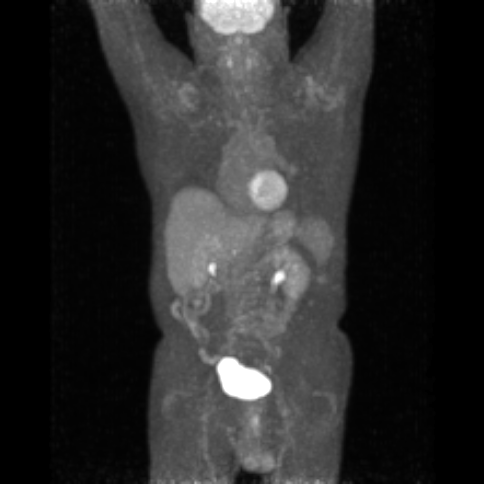

[Series 605: range-ct sk_thigh 5.0 (id)<alpha range> · 3 of 214 slices shown (2 of 2)]
[im 54/214]
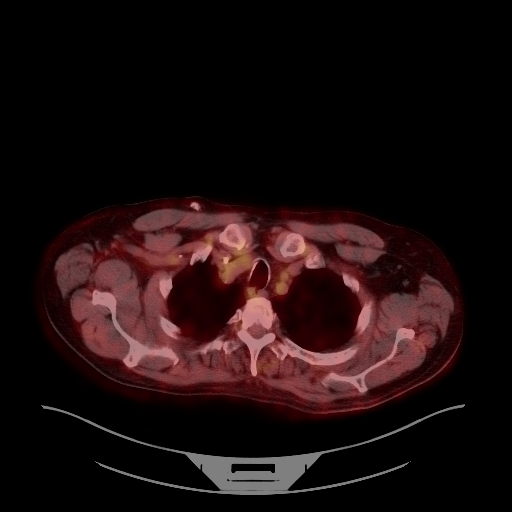
[im 107/214]
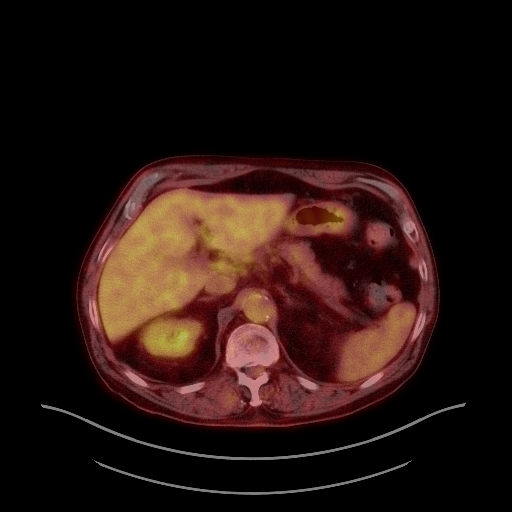
[im 160/214]
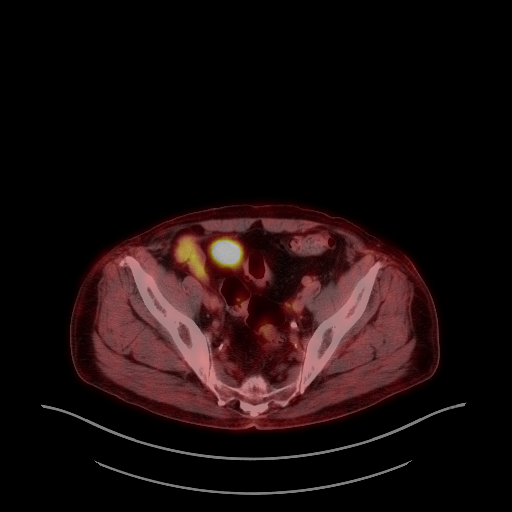

[Series 1808: results mm oncology reading · 3.0mm · 0.45mm/px · 1 of 2 slices shown (1 of 2)]
[im 1/2]
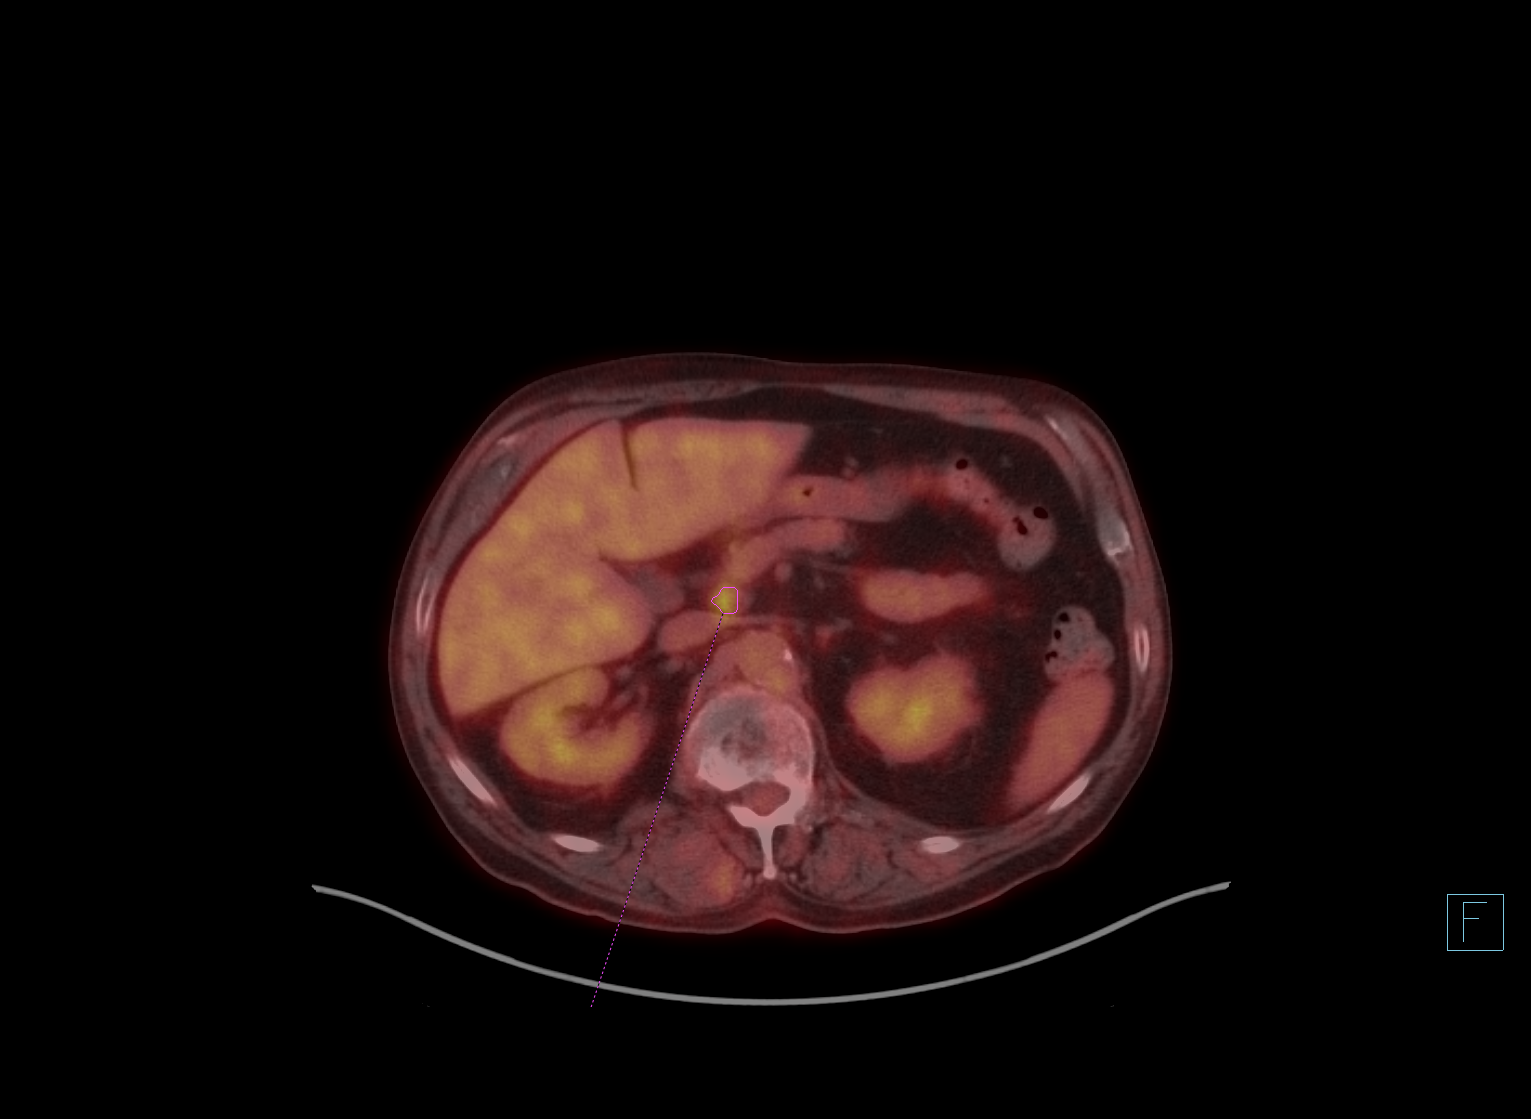

[Series 2100: results mm oncology reading · 5.0mm · 1.03mm/px · 1 of 3 slices shown (2 of 2)]
[im 1/3]
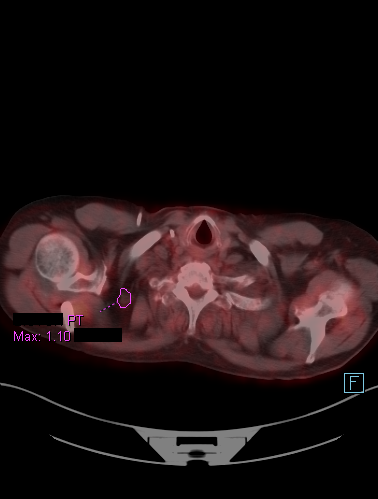

[19 of 25 positions shown; findings below may reference images not displayed]

FINDINGS: Mediastinal blood pool activity: SUV max

Liver activity: SUV max NA

NECK: No hypermetabolic lymph nodes in the neck. Previously
visualized enlarged hypermetabolic lower right posterior triangle
and right supraclavicular neck lymph nodes have resolved.

Incidental CT findings: Right internal jugular Port-A-Cath
terminates at the cavoatrial junction.

CHEST: No enlarged or hypermetabolic axillary, mediastinal or hilar
lymph nodes. Previously visualized hypermetabolic paratracheal,
prevascular, AP window, subcarinal and right hilar nodes have
resolved.

No hypermetabolic pulmonary findings. Right lower lobe 0.8 cm
pulmonary nodule (series 8/image 42) and left upper lobe 0.6 cm
nodule (series 8/image 24) are both stable and demonstrate no FDG
uptake. No new significant pulmonary nodules.

Incidental CT findings: Coronary atherosclerosis. Atherosclerotic
nonaneurysmal thoracic aorta. Mild centrilobular and paraseptal
emphysema with diffuse bronchial wall thickening.

ABDOMEN/PELVIS: No abnormal hypermetabolic activity within the
pancreas, adrenal glands, or spleen.

No residual hypermetabolism within the previously visualized
scattered hypermetabolic liver lesions, which have all decreased in
size and are now not well visualized on the noncontrast CT images.
No new hypermetabolic liver lesions.

Enlarged hypermetabolic 1.2 cm portacaval node with max SUV
(series 4/image 117), previously 1.7 cm with max SUV 15.9 on
[DATE] PET-CT study, decreased in size and significantly
decreased in metabolism. Otherwise no new or residual hypermetabolic
lymph nodes in the abdomen. Previously visualized hypermetabolic
porta hepatis nodes have resolved.

No hypermetabolic lymph nodes in the pelvis.

Incidental CT findings: Atherosclerotic nonaneurysmal abdominal
aorta. Mild prostatomegaly.

SKELETON: No focal hypermetabolic activity to suggest skeletal
metastasis.

Incidental CT findings: none
IMPRESSION: 1. Near complete metabolic response. Mild residual hypermetabolism
within a single portacaval nodal metastasis, which is decreased in
size and significantly decreased in metabolism. Otherwise resolved
right lower neck, mediastinal, right hilar and upper abdominal
lymphadenopathy and resolved liver metastases.
2. Chronic findings include: Aortic Atherosclerosis ([DY]-[DY])
and Emphysema ([DY]-[DY]). Coronary atherosclerosis. Mild
prostatomegaly.

## 2019-12-23 MED ORDER — FLUDEOXYGLUCOSE F - 18 (FDG) INJECTION
8.1600 | Freq: Once | INTRAVENOUS | Status: AC | PRN
  Administered 2019-12-23: 8.16 via INTRAVENOUS

## 2019-12-24 ENCOUNTER — Encounter: Payer: Self-pay | Admitting: *Deleted

## 2019-12-24 NOTE — Progress Notes (Signed)
Volanda Napoleon, MD  P Onc Nurse Hp Call - its a miracle!! Most of the cancer is gone!!! [facility] patient, spoke to wife Colletta [place] and results given  Oncology Nurse Navigator Documentation  Oncology Nurse Navigator Flowsheets 12/24/2019  Abnormal Finding Date -  Confirmed Diagnosis Date -  Diagnosis Status -  Planned Course of Treatment -  Phase of Treatment -  Chemotherapy Actual Start Date: -  Radiation Actual Start Date: -  Navigator Follow Up Date: 12/30/2019  Navigator Follow Up Reason: Follow-up Appointment;Chemotherapy  Production assistant, radio Encounter Type Diagnostic Results;Telephone  Telephone Outgoing Call  Treatment Initiated Date -  Patient Visit Type MedOnc  Treatment Phase Active Tx  Barriers/Navigation Needs Education  Education Other  Interventions Psycho-Social Support;Education  Acuity Level 2-Minimal Needs (1-2 Barriers Identified)  Referrals -  Coordination of Care -  Education Method Verbal  Support Groups/Services Friends and Family  Time Spent with Patient 30

## 2019-12-30 ENCOUNTER — Inpatient Hospital Stay: Payer: Medicare Other

## 2019-12-30 ENCOUNTER — Telehealth: Payer: Self-pay | Admitting: Hematology & Oncology

## 2019-12-30 ENCOUNTER — Other Ambulatory Visit: Payer: Self-pay

## 2019-12-30 ENCOUNTER — Encounter: Payer: Self-pay | Admitting: Hematology & Oncology

## 2019-12-30 ENCOUNTER — Inpatient Hospital Stay (HOSPITAL_BASED_OUTPATIENT_CLINIC_OR_DEPARTMENT_OTHER): Payer: Medicare Other | Admitting: Hematology & Oncology

## 2019-12-30 VITALS — BP 117/78 | HR 97 | Temp 98.5°F | Resp 18 | Wt 163.0 lb

## 2019-12-30 DIAGNOSIS — C3490 Malignant neoplasm of unspecified part of unspecified bronchus or lung: Secondary | ICD-10-CM

## 2019-12-30 DIAGNOSIS — Z5112 Encounter for antineoplastic immunotherapy: Secondary | ICD-10-CM | POA: Diagnosis not present

## 2019-12-30 DIAGNOSIS — C787 Secondary malignant neoplasm of liver and intrahepatic bile duct: Secondary | ICD-10-CM | POA: Diagnosis not present

## 2019-12-30 DIAGNOSIS — Z5111 Encounter for antineoplastic chemotherapy: Secondary | ICD-10-CM | POA: Diagnosis not present

## 2019-12-30 DIAGNOSIS — C4372 Malignant melanoma of left lower limb, including hip: Secondary | ICD-10-CM | POA: Diagnosis not present

## 2019-12-30 DIAGNOSIS — C7931 Secondary malignant neoplasm of brain: Secondary | ICD-10-CM | POA: Diagnosis not present

## 2019-12-30 DIAGNOSIS — C3431 Malignant neoplasm of lower lobe, right bronchus or lung: Secondary | ICD-10-CM | POA: Diagnosis not present

## 2019-12-30 DIAGNOSIS — Z95828 Presence of other vascular implants and grafts: Secondary | ICD-10-CM

## 2019-12-30 LAB — CMP (CANCER CENTER ONLY)
ALT: 11 U/L (ref 0–44)
AST: 19 U/L (ref 15–41)
Albumin: 4.5 g/dL (ref 3.5–5.0)
Alkaline Phosphatase: 55 U/L (ref 38–126)
Anion gap: 10 (ref 5–15)
BUN: 12 mg/dL (ref 8–23)
CO2: 26 mmol/L (ref 22–32)
Calcium: 9.9 mg/dL (ref 8.9–10.3)
Chloride: 101 mmol/L (ref 98–111)
Creatinine: 0.92 mg/dL (ref 0.61–1.24)
GFR, Est AFR Am: >60 mL/min (ref >60)
GFR, Estimated: >60 mL/min (ref >60)
Glucose, Bld: 107 mg/dL — ABNORMAL HIGH (ref 70–99)
Potassium: 4.1 mmol/L (ref 3.5–5.1)
Sodium: 137 mmol/L (ref 135–145)
Total Bilirubin: 0.5 mg/dL (ref 0.3–1.2)
Total Protein: 6.8 g/dL (ref 6.5–8.1)

## 2019-12-30 LAB — CBC WITH DIFFERENTIAL (CANCER CENTER ONLY)
Abs Immature Granulocytes: 0.23 10*3/uL — ABNORMAL HIGH (ref 0.00–0.07)
Basophils Absolute: 0 10*3/uL (ref 0.0–0.1)
Basophils Relative: 1 %
Eosinophils Absolute: 0.1 10*3/uL (ref 0.0–0.5)
Eosinophils Relative: 2 %
HCT: 33.3 % — ABNORMAL LOW (ref 39.0–52.0)
Hemoglobin: 11.6 g/dL — ABNORMAL LOW (ref 13.0–17.0)
Immature Granulocytes: 6 %
Lymphocytes Relative: 38 %
Lymphs Abs: 1.4 10*3/uL (ref 0.7–4.0)
MCH: 33.3 pg (ref 26.0–34.0)
MCHC: 34.8 g/dL (ref 30.0–36.0)
MCV: 95.7 fL (ref 80.0–100.0)
Monocytes Absolute: 0.8 10*3/uL (ref 0.1–1.0)
Monocytes Relative: 22 %
Neutro Abs: 1.1 10*3/uL — ABNORMAL LOW (ref 1.7–7.7)
Neutrophils Relative %: 31 %
Platelet Count: 253 10*3/uL (ref 150–400)
RBC: 3.48 MIL/uL — ABNORMAL LOW (ref 4.22–5.81)
RDW: 15.7 % — ABNORMAL HIGH (ref 11.5–15.5)
WBC Count: 3.6 10*3/uL — ABNORMAL LOW (ref 4.0–10.5)
nRBC: 0 % (ref 0.0–0.2)

## 2019-12-30 LAB — LACTATE DEHYDROGENASE: LDH: 194 U/L — ABNORMAL HIGH (ref 98–192)

## 2019-12-30 MED ORDER — PALONOSETRON HCL INJECTION 0.25 MG/5ML
INTRAVENOUS | Status: AC
  Filled 2019-12-30: qty 5

## 2019-12-30 MED ORDER — SODIUM CHLORIDE 0.9 % IV SOLN
3.2000 mg/m2 | Freq: Once | INTRAVENOUS | Status: AC
  Administered 2019-12-30: 5.55 mg via INTRAVENOUS
  Filled 2019-12-30: qty 11.1

## 2019-12-30 MED ORDER — HEPARIN SOD (PORK) LOCK FLUSH 100 UNIT/ML IV SOLN
500.0000 [IU] | Freq: Once | INTRAVENOUS | Status: AC | PRN
  Administered 2019-12-30: 500 [IU]
  Filled 2019-12-30: qty 5

## 2019-12-30 MED ORDER — SODIUM CHLORIDE 0.9 % IV SOLN
Freq: Once | INTRAVENOUS | Status: AC
  Filled 2019-12-30: qty 250

## 2019-12-30 MED ORDER — PALONOSETRON HCL INJECTION 0.25 MG/5ML
0.2500 mg | Freq: Once | INTRAVENOUS | Status: AC
  Administered 2019-12-30: 0.25 mg via INTRAVENOUS

## 2019-12-30 MED ORDER — SODIUM CHLORIDE 0.9% FLUSH
10.0000 mL | Freq: Once | INTRAVENOUS | Status: AC
  Administered 2019-12-30: 10 mL via INTRAVENOUS
  Filled 2019-12-30: qty 10

## 2019-12-30 MED ORDER — SODIUM CHLORIDE 0.9 % IV SOLN
10.0000 mg | Freq: Once | INTRAVENOUS | Status: AC
  Administered 2019-12-30: 10 mg via INTRAVENOUS
  Filled 2019-12-30: qty 10

## 2019-12-30 MED ORDER — SODIUM CHLORIDE 0.9% FLUSH
10.0000 mL | INTRAVENOUS | Status: DC | PRN
  Administered 2019-12-30: 10 mL
  Filled 2019-12-30: qty 10

## 2019-12-30 MED ORDER — SODIUM CHLORIDE 0.9 % IV SOLN
200.0000 mg | Freq: Once | INTRAVENOUS | Status: AC
  Administered 2019-12-30: 200 mg via INTRAVENOUS
  Filled 2019-12-30: qty 8

## 2019-12-30 NOTE — Telephone Encounter (Signed)
Appointments scheduled calendar printed per  7/26 los

## 2019-12-30 NOTE — Progress Notes (Signed)
Ok to treat with labs and [place] today per MD Marin Olp

## 2019-12-30 NOTE — Patient Instructions (Signed)

## 2019-12-30 NOTE — Patient Instructions (Signed)
[place] Discharge Instructions for Patients Receiving Chemotherapy  Today you received the following chemotherapy agents Keytruda and Zepzelca  To help prevent nausea and vomiting after your treatment, we encourage you to take your nausea medication as prescribed by MD. **DO NOT TAKE ZOFRAN FOR 3 DAYS AFTER CHEMOTHERAPY**   If you develop nausea and vomiting that is not controlled by your nausea medication, call the clinic.   BELOW ARE SYMPTOMS THAT SHOULD BE REPORTED IMMEDIATELY:  *FEVER GREATER THAN 100.5 F  *CHILLS WITH OR WITHOUT FEVER  NAUSEA AND VOMITING THAT IS NOT CONTROLLED WITH YOUR NAUSEA MEDICATION  *UNUSUAL SHORTNESS OF BREATH  *UNUSUAL BRUISING OR BLEEDING  TENDERNESS IN MOUTH AND THROAT WITH OR WITHOUT PRESENCE OF ULCERS  *URINARY PROBLEMS  *BOWEL PROBLEMS  UNUSUAL RASH Items with * indicate a potential emergency and should be followed up as soon as possible.  Feel free to call the clinic should you have any questions or concerns. The clinic phone number is [phone].  Please show the [facility] at check-in to the Emergency Department and triage nurse.

## 2019-12-30 NOTE — Progress Notes (Signed)
Hematology and Oncology Follow Up Visit  Shameek Nyquist [id number] 04-Jan-1951 69 y.o. 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 Palladino is here today for follow-up.  He is doing quite well.  He really has had no specific complaints.  He is gaining weight.  He is eating better.  I suspect that he is feeling so well because his PET scan shows that he has had a fantastic response to the lurbinectedin.  He has had a almost complete metabolic response.  He has had no problems with pain.  He has had no cough or shortness of breath.  He has had no fever.  There is been no change in bowel or bladder habits.  He has had no headache.  There is no weakness.  He has had no rashes.  There is been no leg swelling.  Overall, his performance status is now ECOG 1.    Medications:  Allergies as of 12/30/2019   No Known Allergies     Medication List       Accurate as of December 30, 2019  9:35 AM. If you have any questions, ask your nurse or doctor.        acetaminophen 500 MG tablet Commonly known as: TYLENOL Take 1,000 mg by mouth every 6 (six) hours as needed for moderate pain.   ALPRAZolam 0.5 MG tablet Commonly known as: XANAX Take 1 tablet (0.5 mg total) by mouth every 6 (six) hours as needed for anxiety.   dronabinol 5 MG capsule Commonly known as: MARINOL Take 1 capsule (5 mg total) by mouth 2 (two) times daily before a meal.   finasteride 5 MG tablet Commonly known as: PROSCAR TAKE 1 TABLET BY MOUTH EVERY DAY   furosemide 20 MG  tablet Commonly known as: LASIX Take 2 tablets (40 mg total) by mouth daily.   LORazepam 0.5 MG tablet Commonly known as: ATIVAN Take 1 tablet (0.5 mg total) by mouth every 6 (six) hours as needed for anxiety.   memantine 5 MG tablet Commonly known as: Namenda Starting 06/18/19 Take one tab po daily. Starting 06/25/19 Take one tab po q am and one tab po q pm. Starting 1/26/21Take two tabs po q am, and one tab po q pm. Starting 07/09/19 Take two tabs po q am and two tabs po q pm until 12/03/19.   multivitamin capsule Take 1 capsule by mouth daily.   nicotine 21 mg/24hr patch Commonly known as: NICODERM CQ - dosed in mg/24 hours PLACE 1 PATCH (21 MG TOTAL) ONTO THE SKIN DAILY.   omeprazole 40 MG capsule Commonly known as: PRILOSEC Take 1 capsule (40 mg total) by mouth 2 (two) times daily.   ondansetron 4 MG tablet Commonly known as: ZOFRAN Take 1 tablet (4 mg total) by mouth every 6 (six) hours as needed for nausea.   OPCON-A OP Place 2 drops into both eyes as needed (for dry eyes).   potassium chloride SA 20 MEQ tablet Commonly known as: KLOR-CON Take 1 tablet (20 mEq total) by mouth daily.  tamsulosin 0.4 MG Caps capsule Commonly known as: FLOMAX Take 2 capsules (0.8 mg total) by mouth daily.       Allergies: No Known Allergies  Past Medical History, Surgical history, Social history, and Family History were reviewed and updated.  Review of Systems: Review of Systems  Constitutional: Positive for weight loss.  HENT: Negative.   Eyes: Negative.   Respiratory: Negative.   Cardiovascular: Negative.   Gastrointestinal: Negative.   Genitourinary: Negative.   Musculoskeletal: Negative.   Skin: Negative.   Neurological: Negative.   Endo/Heme/Allergies: Negative.   Psychiatric/Behavioral: Negative.      Physical Exam:  weight is 163 lb (73.9 kg). His oral temperature is 98.5 F (36.9 C). His blood pressure is 117/78 and his pulse is 97. His respiration is 18 and  oxygen saturation is 97%.   Wt Readings from Last 3 Encounters:  12/30/19 163 lb (73.9 kg)  12/30/19 163 lb (73.9 kg)  12/10/19 157 lb (71.2 kg)    Physical Exam Vitals reviewed.  HENT:     Head: Normocephalic and atraumatic.  Eyes:     Pupils: Pupils are equal, round, and reactive to light.  Cardiovascular:     Rate and Rhythm: Normal rate and regular rhythm.     Heart sounds: Normal heart sounds.  Pulmonary:     Effort: Pulmonary effort is normal.     Breath sounds: Normal breath sounds.  Abdominal:     General: Bowel sounds are normal.     Palpations: Abdomen is soft.  Musculoskeletal:        General: No tenderness or deformity. Normal range of motion.     Cervical back: Normal range of motion.  Lymphadenopathy:     Cervical: No cervical adenopathy.  Skin:    General: Skin is warm and dry.     Findings: No erythema or rash.  Neurological:     Mental Status: He is alert and oriented to person, place, and time.  Psychiatric:        Behavior: Behavior normal.        Thought Content: Thought content normal.        Judgment: Judgment normal.      Lab Results  Component Value Date   WBC 3.6 (L) 12/30/2019   HGB 11.6 (L) 12/30/2019   HCT 33.3 (L) 12/30/2019   MCV 95.7 12/30/2019   PLT 253 12/30/2019   Lab Results  Component Value Date   FERRITIN 2,549 (H) 10/21/2019   IRON 43 10/21/2019   TIBC 225 10/21/2019   UIBC 182 10/21/2019   IRONPCTSAT 19 (L) 10/21/2019   Lab Results  Component Value Date   RETICCTPCT 1.5 08/07/2019   RBC 3.48 (L) 12/30/2019   No results found for: KPAFRELGTCHN, LAMBDASER, KAPLAMBRATIO No results found for: IGGSERUM, IGA, IGMSERUM No results found for: Odetta Pink, SPEI   Chemistry      Component Value Date/Time   NA 137 12/30/2019 0855   NA 142 01/30/2017 0804   K 4.1 12/30/2019 0855   K 4.1 01/30/2017 0804   CL 101 12/30/2019 0855   CL 105 01/25/2016 0926   CO2 26  12/30/2019 0855   CO2 29 01/30/2017 0804   BUN 12 12/30/2019 0855   BUN 6.9 (L) 01/30/2017 0804   CREATININE 0.92 12/30/2019 0855   CREATININE 0.8 01/30/2017 0804      Component Value Date/Time   CALCIUM 9.9 12/30/2019 0855   CALCIUM 10.5 (H) 01/30/2017 0804  ALKPHOS 55 12/30/2019 0855   ALKPHOS 79 01/30/2017 0804   AST 19 12/30/2019 0855   AST 34 01/30/2017 0804   ALT 11 12/30/2019 0855   ALT 18 01/30/2017 0804   BILITOT 0.5 12/30/2019 0855   BILITOT 1.49 (H) 01/30/2017 0804       Impression and Plan: Mr. Crymes is a very pleasant 69 yo caucasian gentleman with history of stage 1b melanoma of the left lower leg.   This really is not a problem right now.  I am very impressed with the fact that he has responded so nicely to the [place].  He has had a very good response.  He has had a very good partial remission.  His quality of life is doing much better which is a blessing.  We will go ahead and plan for another follow-up in 3 weeks.  I would plan for 3 cycles of treatment and then go ahead and do another PET scan.    Volanda Napoleon, MD 7/26/20219:35 AM

## 2019-12-31 ENCOUNTER — Encounter: Payer: Self-pay | Admitting: *Deleted

## 2019-12-31 NOTE — Progress Notes (Signed)
Oncology Nurse Navigator Documentation  Oncology Nurse Navigator Flowsheets 12/31/2019  Abnormal Finding Date -  Confirmed Diagnosis Date -  Diagnosis Status -  Planned Course of Treatment -  Phase of Treatment -  Chemotherapy Actual Start Date: -  Radiation Actual Start Date: -  Navigator Follow Up Date: 01/20/2020  Navigator Follow Up Reason: Follow-up Appointment;Chemotherapy  Navigator Location [facility]  Navigator Encounter Type Appt/Treatment Plan Review  Telephone -  Treatment Initiated Date -  Patient Visit Type -  Treatment Phase -  Barriers/Navigation Needs No Barriers At This Time  Education -  Interventions None Required  Acuity -  Referrals -  Coordination of Care -  Education Method -  Support Groups/Services -  Time Spent with Patient 15

## 2020-01-08 ENCOUNTER — Encounter: Payer: Self-pay | Admitting: *Deleted

## 2020-01-09 ENCOUNTER — Ambulatory Visit (HOSPITAL_COMMUNITY)
Admission: RE | Admit: 2020-01-09 | Discharge: 2020-01-09 | Disposition: A | Discharge status: Home / Self Care | Payer: Medicare Other | Source: Ambulatory Visit | Attending: Radiation Oncology | Admitting: Radiation Oncology

## 2020-01-09 ENCOUNTER — Other Ambulatory Visit: Payer: Self-pay

## 2020-01-09 DIAGNOSIS — J3489 Other specified disorders of nose and nasal sinuses: Secondary | ICD-10-CM | POA: Diagnosis not present

## 2020-01-09 DIAGNOSIS — Z85118 Personal history of other malignant neoplasm of bronchus and lung: Secondary | ICD-10-CM | POA: Diagnosis not present

## 2020-01-09 DIAGNOSIS — C7931 Secondary malignant neoplasm of brain: Secondary | ICD-10-CM

## 2020-01-09 DIAGNOSIS — G9389 Other specified disorders of brain: Secondary | ICD-10-CM | POA: Diagnosis not present

## 2020-01-09 DIAGNOSIS — M2548 Effusion, other site: Secondary | ICD-10-CM | POA: Diagnosis not present

## 2020-01-09 IMAGING — MR MR HEAD WO/W CM
10 of 13 series · 27 of 48 positions shown · IV contrast (gadavist)
Comparison: [DATE]

CLINICAL DATA: Metastatic lung cancer

EXAM:
MRI HEAD WITHOUT AND WITH CONTRAST
TECHNIQUE: Multiplanar, multiecho pulse sequences of the brain and surrounding
structures were obtained without and with intravenous contrast.
CONTRAST:  7mL GADAVIST GADOBUTROL 1 MMOL/ML IV SOLN

[Series 3: FLAIR · sagittal · 3.0mm · 0.47mm/px · 2 of 44 slices shown (1 of 2)]
[im 1/44]
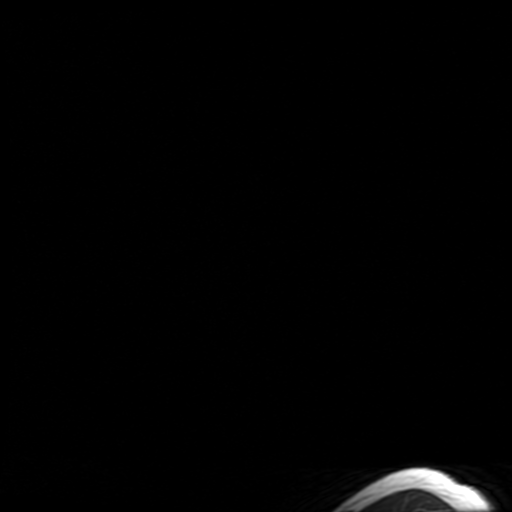
[im 44/44]
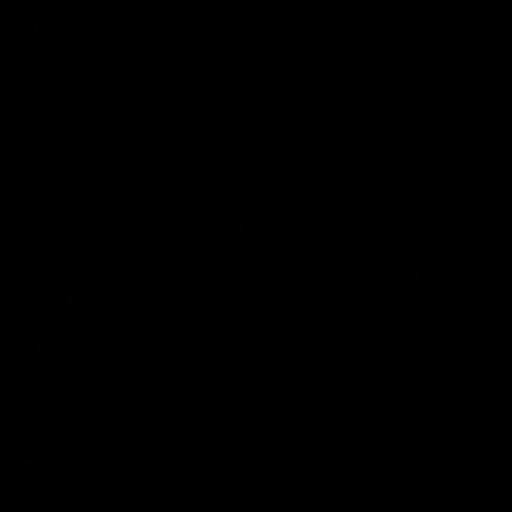

[Series 4: DWI · axial · 3.0mm · 0.94mm/px · z∈[-68,+91]mm · 5 of 108 slices shown (1 of 2)]
[im 1/108]
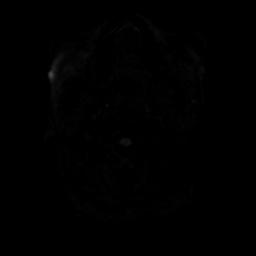
[im 27/108]
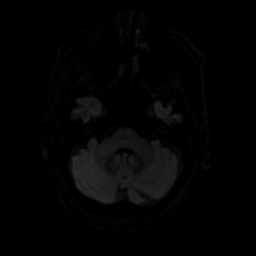
[im 54/108]
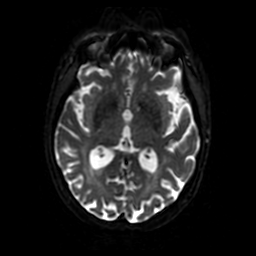
[im 81/108]
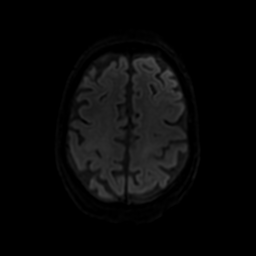
[im 108/108]
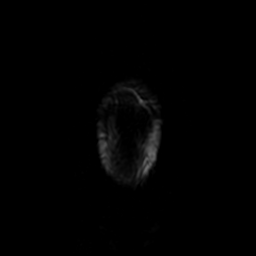

[Series 5: T2 · axial · 5.0mm · 0.47mm/px · 1 of 29 slices shown]
[im 1/29]
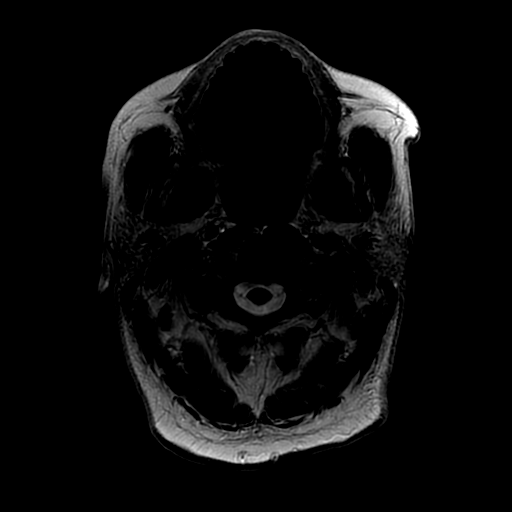

[Series 6: FLAIR · axial · 3.0mm · 0.47mm/px · z∈[-78,+105]mm · 3 of 62 slices shown (2 of 2)]
[im 1/62]
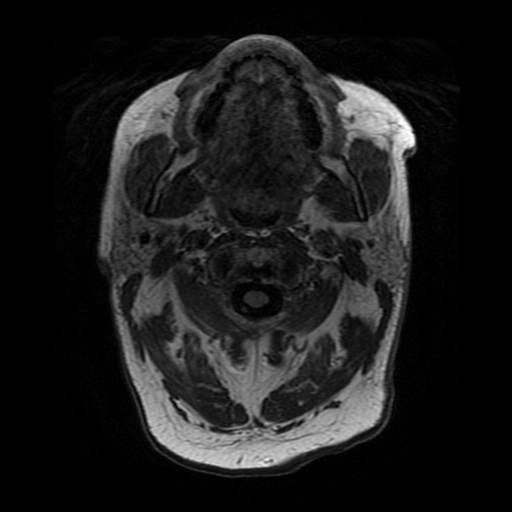
[im 31/62]
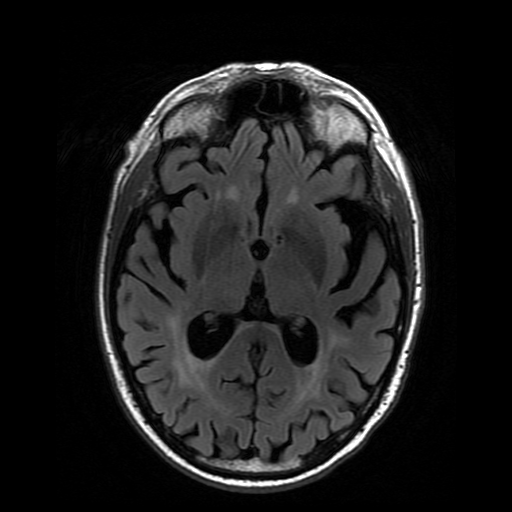
[im 62/62]
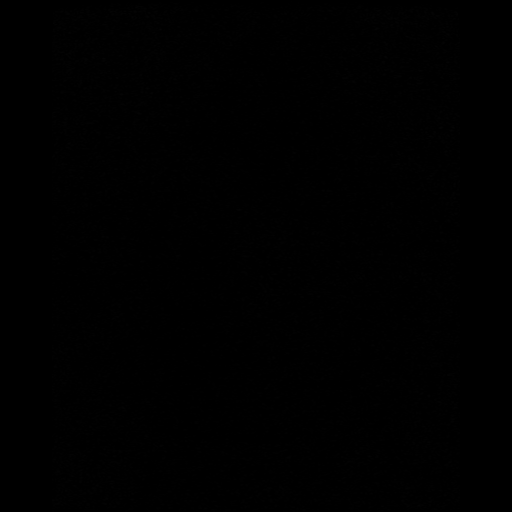

[Series 7: SWI · axial · 3.0mm · 0.47mm/px · z∈[-74,+15]mm · 3 of 120 slices shown]
[im 1/120]
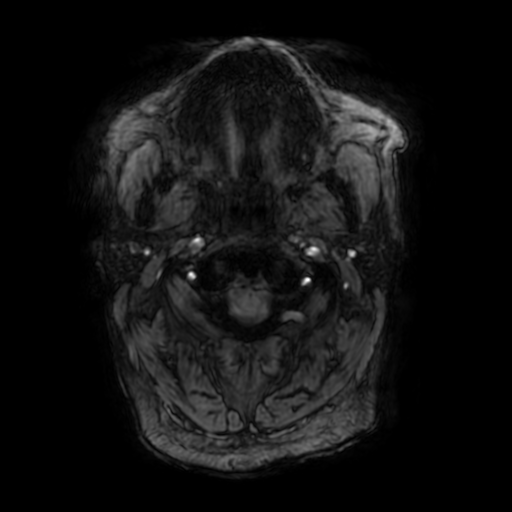
[im 30/120]
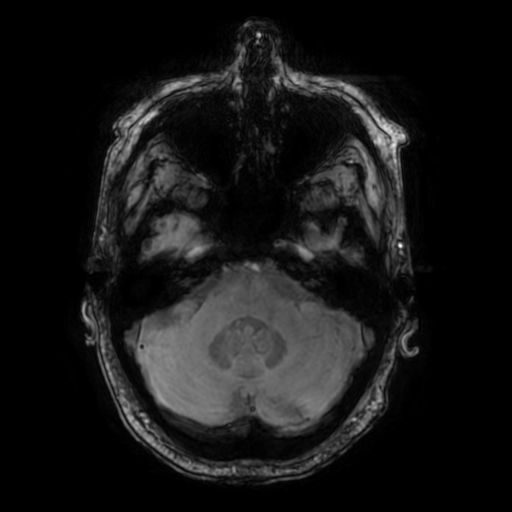
[im 60/120]
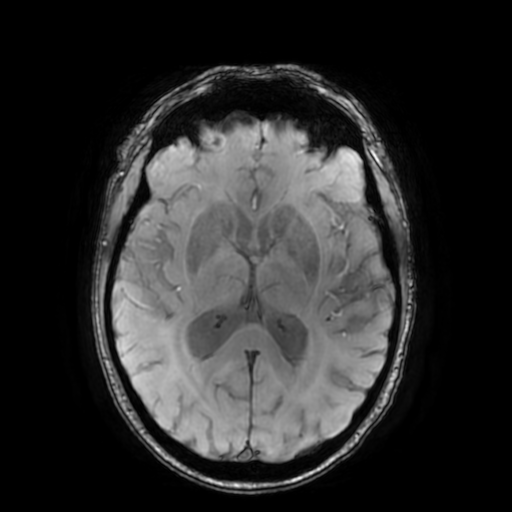

[Series 9: T2 post-contrast · coronal · 3.0mm · 0.39mm/px · 2 of 45 slices shown]
[im 1/45]
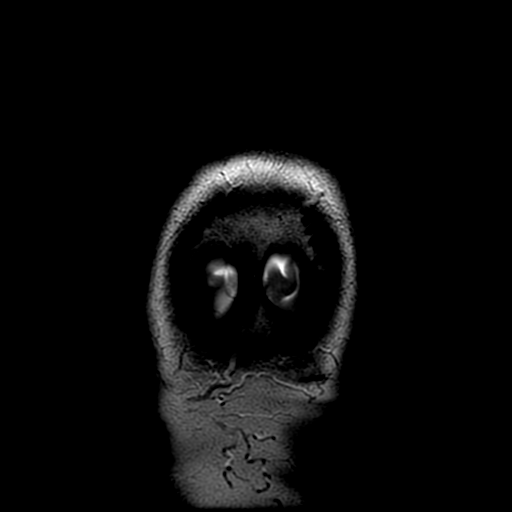
[im 45/45]
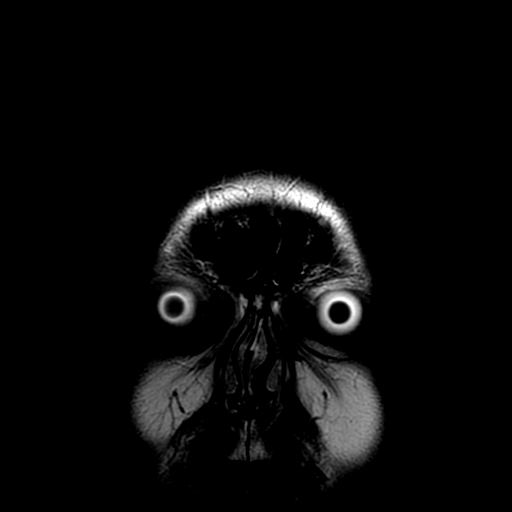

[Series 11: T1 post-contrast · coronal · 3.0mm · 0.43mm/px · 2 of 45 slices shown]
[im 1/45]
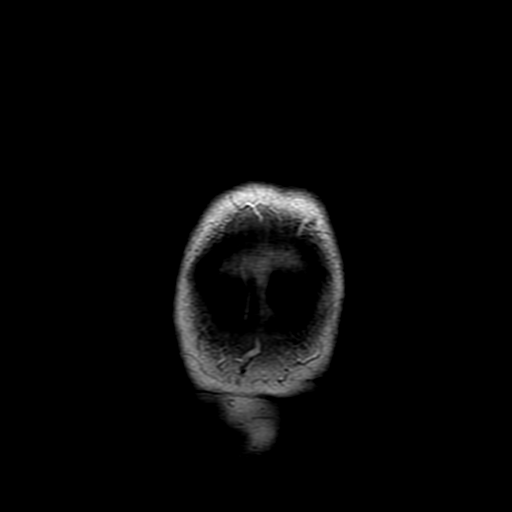
[im 45/45]
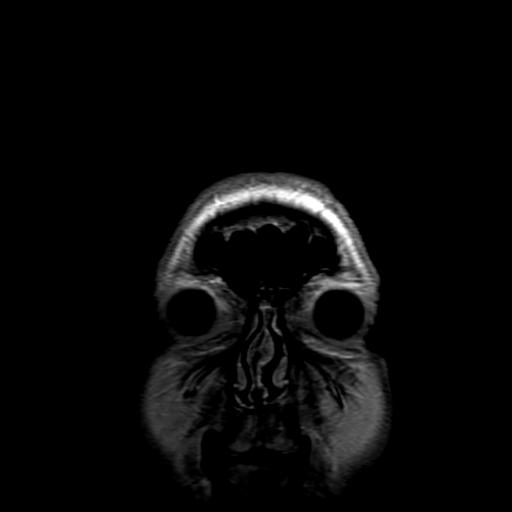

[Series 12: FLAIR post-contrast · sagittal · 3.0mm · 0.47mm/px · 2 of 44 slices shown]
[im 1/44]
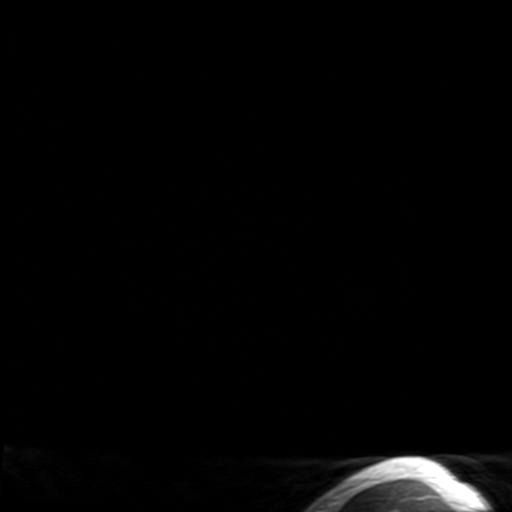
[im 44/44]
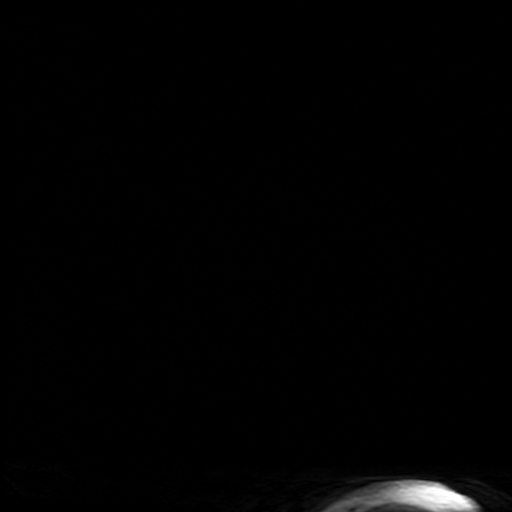

[Series 410: DWI · axial · 3.0mm · 0.94mm/px · z∈[-68,+91]mm · 5 of 108 slices shown (2 of 2)]
[im 1/108]
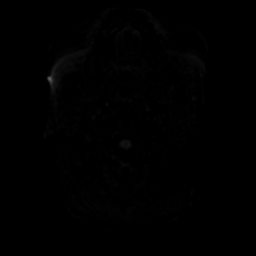
[im 27/108]
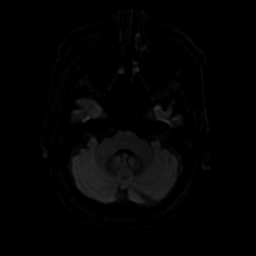
[im 54/108]
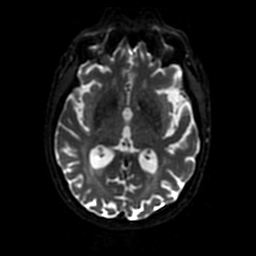
[im 81/108]
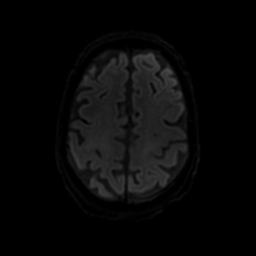
[im 108/108]
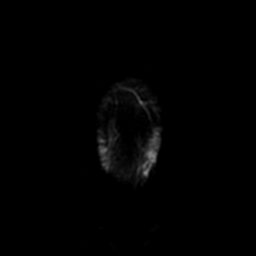

[Series 450: ADC · axial · 3.0mm · 0.94mm/px · z∈[-68,+91]mm · 2 of 54 slices shown]
[im 1/54]
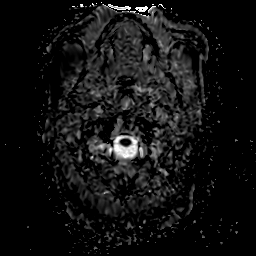
[im 54/54]
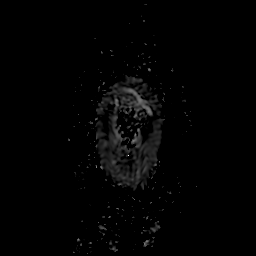

[27 of 48 positions shown; findings below may reference images not displayed]

FINDINGS: Brain: Extent of of enhancing lesion involving the right precentral
gyrus appears stable but enhancement appears thinner. Adjacent T2
FLAIR hyperintensity is stable. Stable punctate focus of enhancement
along the left superior frontal gyrus (series 10, image 138). No new
mass or abnormal enhancement.

Increased confluent T2 hyperintensity in the supratentorial white
matter is likely therapy related.

There is no acute infarction or intracranial hemorrhage. There is no
intracranial mass, mass effect, or edema. There is no hydrocephalus
or extra-axial fluid collection. No abnormal enhancement.

Vascular: Major vessel flow voids at the skull base are preserved.

Skull and upper cervical spine: Normal marrow signal is preserved.

Sinuses/Orbits: Minor mucosal thickening.  Orbits are unremarkable.

Other: Sella is unremarkable.  Bilateral mastoid effusions.
IMPRESSION: Stable to decreased size of right frontal lesion. Stable punctate
left frontal lesion. No new mass or abnormal enhancement.

Increased confluent T2 hyperintensity in the cerebral white matter
is likely therapy related.

## 2020-01-09 MED ORDER — GADOBUTROL 1 MMOL/ML IV SOLN
7.0000 mL | Freq: Once | INTRAVENOUS | Status: AC | PRN
  Administered 2020-01-09: 7 mL via INTRAVENOUS

## 2020-01-13 ENCOUNTER — Telehealth: Payer: Self-pay

## 2020-01-13 ENCOUNTER — Encounter: Payer: Self-pay | Admitting: Urology

## 2020-01-13 NOTE — Telephone Encounter (Signed)
Spoke with patient wife in regards to meaningful use questions. Patient has a telephone visit with Aslyn Bruning on 01/15/20 at 1:00pm. Wife verbalized understanding of appointment date and time. TM

## 2020-01-13 NOTE — Progress Notes (Signed)
Reviewed meaningful use questions for telephone visit with Walter Bruning PA. Patient's wife had a question in regards to continuing Mematine 5mg  tablets.

## 2020-01-14 ENCOUNTER — Telehealth: Payer: Self-pay | Admitting: Radiation Oncology

## 2020-01-14 NOTE — Telephone Encounter (Signed)
Received fax refill request from [facility] for patient's memantine. Returned fax NOT AUTHORIZING refill. Noted the patient should seek refills from neurologist or PCP. Fax confirmation of delivery obtained.

## 2020-01-15 ENCOUNTER — Ambulatory Visit
Admission: RE | Admit: 2020-01-15 | Discharge: 2020-01-15 | Disposition: A | Discharge status: Home / Self Care | Payer: Medicare Other | Source: Ambulatory Visit | Attending: Urology | Admitting: Urology

## 2020-01-15 ENCOUNTER — Other Ambulatory Visit: Payer: Self-pay

## 2020-01-15 DIAGNOSIS — C7931 Secondary malignant neoplasm of brain: Secondary | ICD-10-CM | POA: Diagnosis not present

## 2020-01-15 DIAGNOSIS — Z85118 Personal history of other malignant neoplasm of bronchus and lung: Secondary | ICD-10-CM | POA: Diagnosis not present

## 2020-01-15 DIAGNOSIS — Z08 Encounter for follow-up examination after completed treatment for malignant neoplasm: Secondary | ICD-10-CM | POA: Diagnosis not present

## 2020-01-15 NOTE — Progress Notes (Signed)
Radiation Oncology         [phone] ________________________________  Name: Walter Hernandez MRN: [id number]  Date: 01/15/2020  DOB: [birth year]  Post Treatment Note  CC: Lavone Orn, MD  Lavone Orn, MD  Diagnosis:   69 yo man with brain metastases from extensive stage small cell lung cancer.  Interval Since Last Radiation:  6 months  1/13-1/26/21:   The whole brain was treated to 30 Gy in 10 fractions of 3 Gy  Narrative:  I spoke with the patient and his wife to conduct his routine scheduled 3 month follow up visit to review results from his recent brain MRI via telephone to spare the patient unnecessary potential exposure in the healthcare setting during the current COVID-19 pandemic.  The patient was notified in advance and gave permission to proceed with this visit format. The patient has recovered well from his recent WBRT and is currently without complaints aside from some dysphagia that he has been dealing with on and off for several years associated with his known hiatal hernia.   He denies recent headaches, N/V, dizziness, imbalance, focal weakness, changes in auditory or visual acuity, tremors or seizure activity. He completed 4 cycles of systemic chemotherapy with Carboplatin/VP-16/Tecentriq and transitioned to maintenance immunotherapy with single agent Tecentriq on 10/01/19.  Unfortunately, a repeat PET scan on 10/17/19 showed progressive disease with worsening liver metastases and increased LAN in the mediastinum, hilum and upper abdomen. Therefore, the Tecentriq was discontinued and he was switched to Zelpelca/Keytruda with his first cycle on 10/28/19. A restaging PET scan on 12/23/19, after completing 3 cycles of his new systemic therapy showed a near complete metabolic response to treatment with only mild residual hypermetabolism within a single portacaval nodal metastasis, which is decreased in size and significantly decreased in metabolism. Otherwise resolved right lower neck,  mediastinal, right hilar and upper abdominal lymphadenopathy and resolved liver metastases. As of his most recent follow up with Dr. Marin Olp on 12/30/19, the plan is to continue his current systemic therapy regimen for an additional 3 cycles prior to repeating a PET scan for disease restaging.  His recent post-treatment brain MRI from 01/09/20 continues to show an excellent response to his radiation treatment with stable to decreased size of a treated right frontal lesion, stable punctate left frontal lesion and no new mass or abnormal enhancement. I reviewed these results today with him and his wife.  On review of systems, the patient states that he is doing very well in general.  He has had significant improvement in the fatigue over the past several months and is no longer having any N/V/D.  His appetite has improved as well and he has gained approximately 15 lbs back which he is quite pleased with. He has also had significant improvement in his swallowing with solids since his recent esophageal dilatation. He is drinking 2-3 protein shakes per day to supplement his nutrition in addition to more regular meals. He still has some decreased taste/change in his taste buds but is adapting. Otherwise, he denies headaches, dizziness, paraesthesias, focal weakness, tremors or seizure activity.  The left sided weakness remains completely resolved which he is delighted with. He feels like he is gradually regaining his strength. Overall, he is quite pleased with his progress to date.  ALLERGIES:  has No Known Allergies.  Meds: Current Outpatient Medications  Medication Sig Dispense Refill  . acetaminophen (TYLENOL) 500 MG tablet Take 1,000 mg by mouth every 6 (six) hours as needed for moderate pain.    Marland Kitchen  ALPRAZolam (XANAX) 0.5 MG tablet Take 1 tablet (0.5 mg total) by mouth every 6 (six) hours as needed for anxiety. 60 tablet 0  . finasteride (PROSCAR) 5 MG tablet TAKE 1 TABLET BY MOUTH EVERY DAY 90 tablet 1  .  LORazepam (ATIVAN) 0.5 MG tablet Take 1 tablet (0.5 mg total) by mouth every 6 (six) hours as needed for anxiety. 60 tablet 0  . memantine (NAMENDA) 5 MG tablet Starting 06/18/19 Take one tab po daily. Starting 06/25/19 Take one tab po q am and one tab po q pm. Starting 1/26/21Take two tabs po q am, and one tab po q pm. Starting 07/09/19 Take two tabs po q am and two tabs po q pm until 12/03/19. 602 tablet 0  . Multiple Vitamin (MULTIVITAMIN) capsule Take 1 capsule by mouth daily.    . Naphazoline-Pheniramine (OPCON-A OP) Place 2 drops into both eyes as needed (for dry eyes).    . nicotine (NICODERM CQ - DOSED IN MG/24 HOURS) 21 mg/24hr patch PLACE 1 PATCH (21 MG TOTAL) ONTO THE SKIN DAILY. 28 patch 2  . omeprazole (PRILOSEC) 40 MG capsule Take 1 capsule (40 mg total) by mouth 2 (two) times daily. 180 capsule 3  . potassium chloride SA (KLOR-CON) 20 MEQ tablet Take 1 tablet (20 mEq total) by mouth daily. 30 tablet 4  . dronabinol (MARINOL) 5 MG capsule Take 1 capsule (5 mg total) by mouth 2 (two) times daily before a meal. (Patient not taking: Reported on 10/03/2019) 60 capsule 2  . furosemide (LASIX) 20 MG tablet Take 2 tablets (40 mg total) by mouth daily. (Patient not taking: Reported on 10/03/2019) 30 tablet 5  . ondansetron (ZOFRAN) 4 MG tablet Take 1 tablet (4 mg total) by mouth every 6 (six) hours as needed for nausea. (Patient not taking: Reported on 10/03/2019) 20 tablet 0  . tamsulosin (FLOMAX) 0.4 MG CAPS capsule Take 2 capsules (0.8 mg total) by mouth daily. (Patient not taking: Reported on 10/09/2019) 60 capsule 1   No current facility-administered medications for this encounter.   Facility-Administered Medications Ordered in Other Encounters  Medication Dose Route Frequency Provider Last Rate Last Admin  . sodium chloride flush (NS) 0.9 % injection 10 mL  10 mL Intravenous PRN Volanda Napoleon, MD   10 mL at 10/21/19 8119    Physical Findings:  vitals were not taken for this visit.    Karen Kays to assess due to telephone follow-up visit format.  Lab Findings: Lab Results  Component Value Date   WBC 3.6 (L) 12/30/2019   HGB 11.6 (L) 12/30/2019   HCT 33.3 (L) 12/30/2019   MCV 95.7 12/30/2019   PLT 253 12/30/2019     Radiographic Findings: MR Brain W Wo Contrast  Result Date: 01/09/2020 CLINICAL DATA:  Metastatic lung cancer EXAM: MRI HEAD WITHOUT AND WITH CONTRAST TECHNIQUE: Multiplanar, multiecho pulse sequences of the brain and surrounding structures were obtained without and with intravenous contrast. CONTRAST:  89mL GADAVIST GADOBUTROL 1 MMOL/ML IV SOLN COMPARISON:  10/04/2019 FINDINGS: Brain: Extent of of enhancing lesion involving the right precentral gyrus appears stable but enhancement appears thinner. Adjacent T2 FLAIR hyperintensity is stable. Stable punctate focus of enhancement along the left superior frontal gyrus (series 10, image 138). No new mass or abnormal enhancement. Increased confluent T2 hyperintensity in the supratentorial white matter is likely therapy related. There is no acute infarction or intracranial hemorrhage. There is no intracranial mass, mass effect, or edema. There is no hydrocephalus or extra-axial fluid collection.  No abnormal enhancement. Vascular: Major vessel flow voids at the skull base are preserved. Skull and upper cervical spine: Normal marrow signal is preserved. Sinuses/Orbits: Minor mucosal thickening.  Orbits are unremarkable. Other: Sella is unremarkable.  Bilateral mastoid effusions. IMPRESSION: Stable to decreased size of right frontal lesion. Stable punctate left frontal lesion. No new mass or abnormal enhancement. Increased confluent T2 hyperintensity in the cerebral white matter is likely therapy related. Electronically Signed   By: Macy Mis M.D.   On: 01/09/2020 14:36   NM PET Image Restag (PS) Skull Base To Thigh  Result Date: 12/23/2019 CLINICAL DATA:  Subsequent treatment strategy for extensive stage small cell lung  carcinoma status post 3 cycles chemotherapy. EXAM: NUCLEAR MEDICINE PET SKULL BASE TO THIGH TECHNIQUE: 8.2 mCi F-18 FDG was injected intravenously. Full-ring PET imaging was performed from the skull base to thigh after the radiotracer. CT data was obtained and used for attenuation correction and anatomic localization. Fasting blood glucose: 99 mg/dl COMPARISON:  10/17/2019 PET-CT. FINDINGS: Mediastinal blood pool activity: SUV max 2.4 Liver activity: SUV max NA NECK: No hypermetabolic lymph nodes in the neck. Previously visualized enlarged hypermetabolic lower right posterior triangle and right supraclavicular neck lymph nodes have resolved. Incidental CT findings: Right internal jugular Port-A-Cath terminates at the cavoatrial junction. CHEST: No enlarged or hypermetabolic axillary, mediastinal or hilar lymph nodes. Previously visualized hypermetabolic paratracheal, prevascular, AP window, subcarinal and right hilar nodes have resolved. No hypermetabolic pulmonary findings. Right lower lobe 0.8 cm pulmonary nodule (series 8/image 42) and left upper lobe 0.6 cm nodule (series 8/image 24) are both stable and demonstrate no FDG uptake. No new significant pulmonary nodules. Incidental CT findings: Coronary atherosclerosis. Atherosclerotic nonaneurysmal thoracic aorta. Mild centrilobular and paraseptal emphysema with diffuse bronchial wall thickening. ABDOMEN/PELVIS: No abnormal hypermetabolic activity within the pancreas, adrenal glands, or spleen. No residual hypermetabolism within the previously visualized scattered hypermetabolic liver lesions, which have all decreased in size and are now not well visualized on the noncontrast CT images. No new hypermetabolic liver lesions. Enlarged hypermetabolic 1.2 cm portacaval node with max SUV 5.0 (series 4/image 117), previously 1.7 cm with max SUV 15.9 on 10/17/2019 PET-CT study, decreased in size and significantly decreased in metabolism. Otherwise no new or residual  hypermetabolic lymph nodes in the abdomen. Previously visualized hypermetabolic porta hepatis nodes have resolved. No hypermetabolic lymph nodes in the pelvis. Incidental CT findings: Atherosclerotic nonaneurysmal abdominal aorta. Mild prostatomegaly. SKELETON: No focal hypermetabolic activity to suggest skeletal metastasis. Incidental CT findings: none IMPRESSION: 1. Near complete metabolic response. Mild residual hypermetabolism within a single portacaval nodal metastasis, which is decreased in size and significantly decreased in metabolism. Otherwise resolved right lower neck, mediastinal, right hilar and upper abdominal lymphadenopathy and resolved liver metastases. 2. Chronic findings include: Aortic Atherosclerosis (ICD10-I70.0) and Emphysema (ICD10-J43.9). Coronary atherosclerosis. Mild prostatomegaly. Electronically Signed   By: Ilona Sorrel M.D.   On: 12/23/2019 11:56    Impression/Plan: 1. 69 yo man with brain metastases from extensive stage small cell lung cancer. He appears to have recovered well from the effects of his recent palliative whole brain radiation and currently remains without complaints.  He has recently completed the 6 months of prophylactic Namenda as prescribed and tolerated this well.  His recent brain MRI continues to show an excellent response to treatment so we discussed the plan to continue to monitor the brain closely with serial MRI scans of the brain every 3 months for the first 1-2 years and then every 4-6 months thereafter. He  does require pre-medication with Ativan to tolerate the MRI scans so I will make sure this is available to him for use prior to each scan. We will follow up by telephone following each scan to review results and recommendation from the multi-disciplinary brain conference.   He will also continue in routine follow-up under the care and direction of Dr. Marin Olp for continued management of his systemic disease.  He and his wife appear to have a good  understanding of these recommendations and are comfortable and in agreement with the stated plan.  They know to call at anytime with any questions or concerns related to his previous radiation.  Given current concerns for patient exposure during the COVID-19 pandemic, this encounter was conducted via telephone. The patient was notified in advance and was offered a [place] meeting to allow for face to face communication but unfortunately reported that he did not have the appropriate resources/technology to support such a visit and instead preferred to proceed with telephone consult. The patient has given verbal consent for this type of encounter. The time spent during this encounter was 25 minutes. The attendants for this meeting include [place] Huberty PA-C, patient, Milfred Krammes and his wife, Kennyth Lose. During the encounter, Taiga Lupinacci PA-C, was located at [facility] Oncology Department.  Patient, Kyion Gautier and his wife, Kennyth Lose  were located at home.    Nicholos Johns, PA-C

## 2020-01-20 ENCOUNTER — Inpatient Hospital Stay: Payer: Medicare Other

## 2020-01-20 ENCOUNTER — Other Ambulatory Visit: Payer: Self-pay

## 2020-01-20 ENCOUNTER — Inpatient Hospital Stay: Payer: Medicare Other | Attending: Hematology & Oncology

## 2020-01-20 ENCOUNTER — Telehealth: Payer: Self-pay | Admitting: Hematology & Oncology

## 2020-01-20 ENCOUNTER — Encounter: Payer: Self-pay | Admitting: Hematology & Oncology

## 2020-01-20 ENCOUNTER — Encounter: Payer: Self-pay | Admitting: *Deleted

## 2020-01-20 ENCOUNTER — Inpatient Hospital Stay (HOSPITAL_BASED_OUTPATIENT_CLINIC_OR_DEPARTMENT_OTHER): Payer: Medicare Other | Admitting: Hematology & Oncology

## 2020-01-20 VITALS — HR 81

## 2020-01-20 VITALS — BP 100/49 | HR 107 | Temp 98.0°F | Resp 19 | Wt 166.0 lb

## 2020-01-20 DIAGNOSIS — C4372 Malignant melanoma of left lower limb, including hip: Secondary | ICD-10-CM | POA: Diagnosis not present

## 2020-01-20 DIAGNOSIS — C7931 Secondary malignant neoplasm of brain: Secondary | ICD-10-CM | POA: Insufficient documentation

## 2020-01-20 DIAGNOSIS — R634 Abnormal weight loss: Secondary | ICD-10-CM | POA: Diagnosis not present

## 2020-01-20 DIAGNOSIS — Z5112 Encounter for antineoplastic immunotherapy: Secondary | ICD-10-CM | POA: Diagnosis not present

## 2020-01-20 DIAGNOSIS — C787 Secondary malignant neoplasm of liver and intrahepatic bile duct: Secondary | ICD-10-CM | POA: Insufficient documentation

## 2020-01-20 DIAGNOSIS — Z5111 Encounter for antineoplastic chemotherapy: Secondary | ICD-10-CM | POA: Insufficient documentation

## 2020-01-20 DIAGNOSIS — C3490 Malignant neoplasm of unspecified part of unspecified bronchus or lung: Secondary | ICD-10-CM | POA: Diagnosis not present

## 2020-01-20 DIAGNOSIS — Z79899 Other long term (current) drug therapy: Secondary | ICD-10-CM | POA: Diagnosis not present

## 2020-01-20 DIAGNOSIS — C3431 Malignant neoplasm of lower lobe, right bronchus or lung: Secondary | ICD-10-CM | POA: Insufficient documentation

## 2020-01-20 LAB — CBC WITH DIFFERENTIAL (CANCER CENTER ONLY)
Abs Immature Granulocytes: 0.3 10*3/uL — ABNORMAL HIGH (ref 0.00–0.07)
Basophils Absolute: 0.1 10*3/uL (ref 0.0–0.1)
Basophils Relative: 1 %
Eosinophils Absolute: 0.1 10*3/uL (ref 0.0–0.5)
Eosinophils Relative: 1 %
HCT: 33.4 % — ABNORMAL LOW (ref 39.0–52.0)
Hemoglobin: 11.7 g/dL — ABNORMAL LOW (ref 13.0–17.0)
Immature Granulocytes: 5 %
Lymphocytes Relative: 24 %
Lymphs Abs: 1.5 10*3/uL (ref 0.7–4.0)
MCH: 34.1 pg — ABNORMAL HIGH (ref 26.0–34.0)
MCHC: 35 g/dL (ref 30.0–36.0)
MCV: 97.4 fL (ref 80.0–100.0)
Monocytes Absolute: 1.2 10*3/uL — ABNORMAL HIGH (ref 0.1–1.0)
Monocytes Relative: 20 %
Neutro Abs: 2.9 10*3/uL (ref 1.7–7.7)
Neutrophils Relative %: 49 %
Platelet Count: 252 10*3/uL (ref 150–400)
RBC: 3.43 MIL/uL — ABNORMAL LOW (ref 4.22–5.81)
RDW: 16.5 % — ABNORMAL HIGH (ref 11.5–15.5)
WBC Count: 6 10*3/uL (ref 4.0–10.5)
nRBC: 0 % (ref 0.0–0.2)

## 2020-01-20 LAB — CMP (CANCER CENTER ONLY)
ALT: 10 U/L (ref 0–44)
AST: 16 U/L (ref 15–41)
Albumin: 4.5 g/dL (ref 3.5–5.0)
Alkaline Phosphatase: 69 U/L (ref 38–126)
Anion gap: 9 (ref 5–15)
BUN: 12 mg/dL (ref 8–23)
CO2: 27 mmol/L (ref 22–32)
Calcium: 9.8 mg/dL (ref 8.9–10.3)
Chloride: 102 mmol/L (ref 98–111)
Creatinine: 1.04 mg/dL (ref 0.61–1.24)
GFR, Est AFR Am: >60 mL/min (ref >60)
GFR, Estimated: >60 mL/min (ref >60)
Glucose, Bld: 117 mg/dL — ABNORMAL HIGH (ref 70–99)
Potassium: 3.8 mmol/L (ref 3.5–5.1)
Sodium: 138 mmol/L (ref 135–145)
Total Bilirubin: 0.6 mg/dL (ref 0.3–1.2)
Total Protein: 6.7 g/dL (ref 6.5–8.1)

## 2020-01-20 LAB — LACTATE DEHYDROGENASE: LDH: 229 U/L — ABNORMAL HIGH (ref 98–192)

## 2020-01-20 MED ORDER — SODIUM CHLORIDE 0.9 % IV SOLN
10.0000 mg | Freq: Once | INTRAVENOUS | Status: AC
  Administered 2020-01-20: 10 mg via INTRAVENOUS
  Filled 2020-01-20: qty 10

## 2020-01-20 MED ORDER — SODIUM CHLORIDE 0.9 % IV SOLN
200.0000 mg | Freq: Once | INTRAVENOUS | Status: AC
  Administered 2020-01-20: 200 mg via INTRAVENOUS
  Filled 2020-01-20: qty 8

## 2020-01-20 MED ORDER — SODIUM CHLORIDE 0.9 % IV SOLN
3.2000 mg/m2 | Freq: Once | INTRAVENOUS | Status: AC
  Administered 2020-01-20: 5.55 mg via INTRAVENOUS
  Filled 2020-01-20: qty 11.1

## 2020-01-20 MED ORDER — SODIUM CHLORIDE 0.9 % IV SOLN
Freq: Once | INTRAVENOUS | Status: AC
  Filled 2020-01-20: qty 250

## 2020-01-20 MED ORDER — PALONOSETRON HCL INJECTION 0.25 MG/5ML
0.2500 mg | Freq: Once | INTRAVENOUS | Status: AC
  Administered 2020-01-20: 0.25 mg via INTRAVENOUS

## 2020-01-20 MED ORDER — DOXYCYCLINE HYCLATE 100 MG PO TABS
100.0000 mg | ORAL_TABLET | Freq: Two times a day (BID) | ORAL | 0 refills | Status: DC
Start: 2020-01-20 — End: 2020-02-04

## 2020-01-20 MED ORDER — HEPARIN SOD (PORK) LOCK FLUSH 100 UNIT/ML IV SOLN
500.0000 [IU] | Freq: Once | INTRAVENOUS | Status: AC | PRN
  Administered 2020-01-20: 500 [IU]
  Filled 2020-01-20: qty 5

## 2020-01-20 MED ORDER — PALONOSETRON HCL INJECTION 0.25 MG/5ML
INTRAVENOUS | Status: AC
  Filled 2020-01-20: qty 5

## 2020-01-20 MED ORDER — SODIUM CHLORIDE 0.9% FLUSH
10.0000 mL | INTRAVENOUS | Status: DC | PRN
  Administered 2020-01-20: 10 mL
  Filled 2020-01-20: qty 10

## 2020-01-20 MED FILL — DOXYCYCLINE HYCLATE 100 MG: 100 | 10 days supply | Qty: 20 | Fill #0

## 2020-01-20 NOTE — Progress Notes (Signed)
Oncology Nurse Navigator Documentation  Oncology Nurse Navigator Flowsheets 01/20/2020  Abnormal Finding Date -  Confirmed Diagnosis Date -  Diagnosis Status -  Planned Course of Treatment -  Phase of Treatment -  Chemotherapy Actual Start Date: -  Radiation Actual Start Date: -  Navigator Follow Up Date: 02/11/2020  Navigator Follow Up Reason: Follow-up Appointment;Chemotherapy  Navigator Restaurant manager, fast food Encounter Type Treatment  Telephone -  Treatment Initiated Date -  Patient Visit Type MedOnc  Treatment Phase Active Tx  Barriers/Navigation Needs No Barriers At This Time  Education -  Interventions Psycho-Social Support  Acuity Level 2-Minimal Needs (1-2 Barriers Identified)  Referrals -  Coordination of Care -  Education Method -  Support Groups/Services Friends and Family  Time Spent with Patient 30

## 2020-01-20 NOTE — Patient Instructions (Signed)
[facility] Discharge Instructions for Patients Receiving Chemotherapy  Today you received the following chemotherapy agents Keytruda and Zepzelca  To help prevent nausea and vomiting after your treatment, we encourage you to take your nausea medication as prescribed by MD. **DO NOT TAKE ZOFRAN FOR 3 DAYS AFTER CHEMOTHERAPY**   If you develop nausea and vomiting that is not controlled by your nausea medication, call the clinic.   BELOW ARE SYMPTOMS THAT SHOULD BE REPORTED IMMEDIATELY:  *FEVER GREATER THAN 100.5 F  *CHILLS WITH OR WITHOUT FEVER  NAUSEA AND VOMITING THAT IS NOT CONTROLLED WITH YOUR NAUSEA MEDICATION  *UNUSUAL SHORTNESS OF BREATH  *UNUSUAL BRUISING OR BLEEDING  TENDERNESS IN MOUTH AND THROAT WITH OR WITHOUT PRESENCE OF ULCERS  *URINARY PROBLEMS  *BOWEL PROBLEMS  UNUSUAL RASH Items with * indicate a potential emergency and should be followed up as soon as possible.  Feel free to call the clinic should you have any questions or concerns. The clinic phone number is [phone].  Please show the [place] [facility] at check-in to the Emergency Department and triage nurse.

## 2020-01-20 NOTE — Addendum Note (Signed)
Addended by: Burney Gauze R on: 01/20/2020 02:24 PM   Modules accepted: Orders

## 2020-01-20 NOTE — Progress Notes (Signed)
Hematology and Oncology Follow Up Visit  Walter Hernandez [id number] 1950-08-28 69 y.o. 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  He is doing quite well.  Had a very nice weekend.  He is gaining some weight.  He is eating nicely.  He admits that he does not walk as much as he would like.  He has had no problems with cough or shortness of breath.  There has been no change in bowel or bladder habits.  He has had no rashes.  There is been no headache.  He had a MRI of the brain done on 01/09/2020.  This showed stable to slightly decreased size of right frontal lesion.  There is nothing new that was noted.  There is no edema in the brain.  Overall, his performance status is ECOG 1.    Medications:  Allergies as of 01/20/2020   No Known Allergies     Medication List       Accurate as of January 20, 2020 12:38 PM. If you have any questions, ask your nurse or doctor.        acetaminophen 500 MG tablet Commonly known as: TYLENOL Take 1,000 mg by mouth every 6 (six) hours as needed for moderate pain.   ALPRAZolam 0.5 MG tablet Commonly known as: XANAX Take 1 tablet (0.5 mg total) by mouth every 6 (six) hours as needed for anxiety.   dronabinol 5 MG capsule Commonly known as: MARINOL Take 1 capsule (5 mg total) by mouth 2 (two) times daily before a meal.   finasteride 5 MG tablet Commonly known as: PROSCAR TAKE 1 TABLET BY MOUTH EVERY DAY   furosemide 20 MG tablet Commonly known as: LASIX Take 2  tablets (40 mg total) by mouth daily.   LORazepam 0.5 MG tablet Commonly known as: ATIVAN Take 1 tablet (0.5 mg total) by mouth every 6 (six) hours as needed for anxiety.   memantine 5 MG tablet Commonly known as: Namenda Starting 06/18/19 Take one tab po daily. Starting 06/25/19 Take one tab po q am and one tab po q pm. Starting 1/26/21Take two tabs po q am, and one tab po q pm. Starting 07/09/19 Take two tabs po q am and two tabs po q pm until 12/03/19.   memantine 10 MG tablet Commonly known as: NAMENDA Take 10 mg by mouth daily.   multivitamin capsule Take 1 capsule by mouth daily.   nicotine 21 mg/24hr patch Commonly known as: NICODERM CQ - dosed in mg/24 hours PLACE 1 PATCH (21 MG TOTAL) ONTO THE SKIN DAILY.   omeprazole 40 MG capsule Commonly known as: PRILOSEC Take 1 capsule (40 mg total) by mouth 2 (two) times daily.   ondansetron 4 MG tablet Commonly known as: ZOFRAN Take 1 tablet (4 mg total) by mouth every 6 (six) hours as needed for nausea.   OPCON-A OP Place 2 drops into both eyes as needed (for dry eyes).   potassium chloride SA 20 MEQ tablet Commonly known as: KLOR-CON Take 1  tablet (20 mEq total) by mouth daily.   tamsulosin 0.4 MG Caps capsule Commonly known as: FLOMAX Take 2 capsules (0.8 mg total) by mouth daily.       Allergies: No Known Allergies  Past Medical History, Surgical history, Social history, and Family History were reviewed and updated.  Review of Systems: Review of Systems  Constitutional: Positive for weight loss.  HENT: Negative.   Eyes: Negative.   Respiratory: Negative.   Cardiovascular: Negative.   Gastrointestinal: Negative.   Genitourinary: Negative.   Musculoskeletal: Negative.   Skin: Negative.   Neurological: Negative.   Endo/Heme/Allergies: Negative.   Psychiatric/Behavioral: Negative.      Physical Exam:  weight is 166 lb (75.3 kg). His oral temperature is 98 F (36.7 C). His blood pressure is 100/49  (abnormal) and his pulse is 107 (abnormal). His respiration is 19 and oxygen saturation is 99%.   Wt Readings from Last 3 Encounters:  01/20/20 166 lb (75.3 kg)  12/30/19 163 lb (73.9 kg)  12/30/19 163 lb (73.9 kg)    Physical Exam Vitals reviewed.  HENT:     Head: Normocephalic and atraumatic.  Eyes:     Pupils: Pupils are equal, round, and reactive to light.  Cardiovascular:     Rate and Rhythm: Normal rate and regular rhythm.     Heart sounds: Normal heart sounds.  Pulmonary:     Effort: Pulmonary effort is normal.     Breath sounds: Normal breath sounds.  Abdominal:     General: Bowel sounds are normal.     Palpations: Abdomen is soft.  Musculoskeletal:        General: No tenderness or deformity. Normal range of motion.     Cervical back: Normal range of motion.  Lymphadenopathy:     Cervical: No cervical adenopathy.  Skin:    General: Skin is warm and dry.     Findings: No erythema or rash.  Neurological:     Mental Status: He is alert and oriented to person, place, and time.  Psychiatric:        Behavior: Behavior normal.        Thought Content: Thought content normal.        Judgment: Judgment normal.      Lab Results  Component Value Date   WBC 6.0 01/20/2020   HGB 11.7 (L) 01/20/2020   HCT 33.4 (L) 01/20/2020   MCV 97.4 01/20/2020   PLT 252 01/20/2020   Lab Results  Component Value Date   FERRITIN 2,549 (H) 10/21/2019   IRON 43 10/21/2019   TIBC 225 10/21/2019   UIBC 182 10/21/2019   IRONPCTSAT 19 (L) 10/21/2019   Lab Results  Component Value Date   RETICCTPCT 1.5 08/07/2019   RBC 3.43 (L) 01/20/2020   No results found for: KPAFRELGTCHN, LAMBDASER, KAPLAMBRATIO No results found for: IGGSERUM, IGA, IGMSERUM No results found for: Ronnald Ramp, A1GS, A2GS, Violet Baldy, MSPIKE, SPEI   Chemistry      Component Value Date/Time   NA 138 01/20/2020 1200   NA 142 01/30/2017 0804   K 3.8 01/20/2020 1200   K 4.1 01/30/2017 0804    CL 102 01/20/2020 1200   CL 105 01/25/2016 0926   CO2 27 01/20/2020 1200   CO2 29 01/30/2017 0804   BUN 12 01/20/2020 1200   BUN 6.9 (L) 01/30/2017 0804   CREATININE 1.04 01/20/2020 1200   CREATININE 0.8 01/30/2017 0804      Component Value Date/Time   CALCIUM 9.8  01/20/2020 1200   CALCIUM 10.5 (H) 01/30/2017 0804   ALKPHOS 69 01/20/2020 1200   ALKPHOS 79 01/30/2017 0804   AST 16 01/20/2020 1200   AST 34 01/30/2017 0804   ALT 10 01/20/2020 1200   ALT 18 01/30/2017 0804   BILITOT 0.6 01/20/2020 1200   BILITOT 1.49 (H) 01/30/2017 0804       Impression and Plan: Walter Hernandez is a very pleasant 69 yo caucasian gentleman with history of stage 1b melanoma of the left lower leg.   This really is not a problem right now.  I am very impressed with the fact that he has responded so nicely to the [place].  He has had a very good response.  He has had a very good partial remission.  His quality of life is doing much better which is a blessing.  We will go ahead with his fifth cycle of Zepzelka.  Again he is doing quite nicely with this.  Hopefully, we will see that he is responding.  We will plan for follow-up in 3 weeks.     Volanda Napoleon, MD 8/16/202112:38 PM

## 2020-01-20 NOTE — Telephone Encounter (Signed)
Appointments scheduled calendar printed per 8/16 los

## 2020-01-20 NOTE — Patient Instructions (Signed)

## 2020-01-21 ENCOUNTER — Encounter: Payer: Self-pay | Admitting: *Deleted

## 2020-01-21 DIAGNOSIS — C4372 Malignant melanoma of left lower limb, including hip: Secondary | ICD-10-CM

## 2020-01-21 MED ORDER — TAMSULOSIN HCL 0.4 MG PO CAPS: 0.4000 mg | ORAL_CAPSULE | Freq: Every day | ORAL | 1 refills | Status: AC

## 2020-01-21 NOTE — Progress Notes (Signed)
Received a call from patient's wife, Walter Hernandez. She states patient had a low BP at his appointment yesterday and Dr Marin Olp mentioned it might be due to his flomax. They held his dose last night and his BP was better this morning at 117/70. Walter Hernandez would like to know how they should proceed with flomax.  Spoke with Dr Marin Olp. He doesn't want patient to stop flomax as it may cause his urinary issues to return. Dr Marin Olp would like patient to reduce his dose to one capsule daily.   Reviewed new medication instruction with Walter Hernandez. She confirmed with teach back.   Oncology Nurse Navigator Documentation  Oncology Nurse Navigator Flowsheets 01/21/2020  Abnormal Finding Date -  Confirmed Diagnosis Date -  Diagnosis Status -  Planned Course of Treatment -  Phase of Treatment -  Chemotherapy Actual Start Date: -  Radiation Actual Start Date: -  Navigator Follow Up Date: -  Navigator Follow Up Reason: -  Production assistant, radio Encounter Type Telephone  Telephone Medication Assistance;Incoming Call  Treatment Initiated Date -  Patient Visit Type MedOnc  Treatment Phase Active Tx  Barriers/Navigation Needs Education;Coordination of Care  Education Other  Interventions Medication Assistance;Psycho-Social Support  Acuity Level 2-Minimal Needs (1-2 Barriers Identified)  Referrals -  Coordination of Care -  Education Method Verbal  Support Groups/Services Friends and Family  Time Spent with Patient 30

## 2020-02-04 ENCOUNTER — Other Ambulatory Visit: Payer: Self-pay | Admitting: *Deleted

## 2020-02-04 DIAGNOSIS — Z85820 Personal history of malignant melanoma of skin: Secondary | ICD-10-CM | POA: Diagnosis not present

## 2020-02-04 DIAGNOSIS — C44329 Squamous cell carcinoma of skin of other parts of face: Secondary | ICD-10-CM | POA: Diagnosis not present

## 2020-02-04 DIAGNOSIS — D485 Neoplasm of uncertain behavior of skin: Secondary | ICD-10-CM | POA: Diagnosis not present

## 2020-02-04 MED ORDER — DOXYCYCLINE HYCLATE 100 MG PO TABS: 100.0000 mg | ORAL_TABLET | Freq: Two times a day (BID) | ORAL | 0 refills | Status: DC

## 2020-02-11 ENCOUNTER — Inpatient Hospital Stay (HOSPITAL_BASED_OUTPATIENT_CLINIC_OR_DEPARTMENT_OTHER): Payer: Medicare Other | Admitting: Hematology & Oncology

## 2020-02-11 ENCOUNTER — Telehealth: Payer: Self-pay | Admitting: Hematology & Oncology

## 2020-02-11 ENCOUNTER — Inpatient Hospital Stay: Payer: Medicare Other | Attending: Hematology & Oncology

## 2020-02-11 ENCOUNTER — Encounter: Payer: Self-pay | Admitting: *Deleted

## 2020-02-11 ENCOUNTER — Inpatient Hospital Stay: Payer: Medicare Other

## 2020-02-11 ENCOUNTER — Other Ambulatory Visit: Payer: Self-pay

## 2020-02-11 ENCOUNTER — Encounter: Payer: Self-pay | Admitting: Hematology & Oncology

## 2020-02-11 VITALS — BP 141/71 | HR 82 | Temp 98.2°F | Resp 18 | Wt 168.0 lb

## 2020-02-11 DIAGNOSIS — C4372 Malignant melanoma of left lower limb, including hip: Secondary | ICD-10-CM | POA: Insufficient documentation

## 2020-02-11 DIAGNOSIS — Z79899 Other long term (current) drug therapy: Secondary | ICD-10-CM | POA: Diagnosis not present

## 2020-02-11 DIAGNOSIS — C7931 Secondary malignant neoplasm of brain: Secondary | ICD-10-CM | POA: Diagnosis not present

## 2020-02-11 DIAGNOSIS — Z5112 Encounter for antineoplastic immunotherapy: Secondary | ICD-10-CM | POA: Insufficient documentation

## 2020-02-11 DIAGNOSIS — R634 Abnormal weight loss: Secondary | ICD-10-CM | POA: Insufficient documentation

## 2020-02-11 DIAGNOSIS — C3431 Malignant neoplasm of lower lobe, right bronchus or lung: Secondary | ICD-10-CM | POA: Diagnosis not present

## 2020-02-11 DIAGNOSIS — Z5111 Encounter for antineoplastic chemotherapy: Secondary | ICD-10-CM | POA: Diagnosis not present

## 2020-02-11 DIAGNOSIS — C3490 Malignant neoplasm of unspecified part of unspecified bronchus or lung: Secondary | ICD-10-CM

## 2020-02-11 DIAGNOSIS — C787 Secondary malignant neoplasm of liver and intrahepatic bile duct: Secondary | ICD-10-CM | POA: Diagnosis not present

## 2020-02-11 LAB — LACTATE DEHYDROGENASE: LDH: 164 U/L (ref 98–192)

## 2020-02-11 LAB — CBC WITH DIFFERENTIAL (CANCER CENTER ONLY)
Abs Immature Granulocytes: 0.26 10*3/uL — ABNORMAL HIGH (ref 0.00–0.07)
Basophils Absolute: 0 10*3/uL (ref 0.0–0.1)
Basophils Relative: 1 %
Eosinophils Absolute: 0.1 10*3/uL (ref 0.0–0.5)
Eosinophils Relative: 1 %
HCT: 30.7 % — ABNORMAL LOW (ref 39.0–52.0)
Hemoglobin: 10.9 g/dL — ABNORMAL LOW (ref 13.0–17.0)
Immature Granulocytes: 6 %
Lymphocytes Relative: 32 %
Lymphs Abs: 1.5 10*3/uL (ref 0.7–4.0)
MCH: 35.6 pg — ABNORMAL HIGH (ref 26.0–34.0)
MCHC: 35.5 g/dL (ref 30.0–36.0)
MCV: 100.3 fL — ABNORMAL HIGH (ref 80.0–100.0)
Monocytes Absolute: 1 10*3/uL (ref 0.1–1.0)
Monocytes Relative: 20 %
Neutro Abs: 1.9 10*3/uL (ref 1.7–7.7)
Neutrophils Relative %: 40 %
Platelet Count: 235 10*3/uL (ref 150–400)
RBC: 3.06 MIL/uL — ABNORMAL LOW (ref 4.22–5.81)
RDW: 15.5 % (ref 11.5–15.5)
WBC Count: 4.8 10*3/uL (ref 4.0–10.5)
nRBC: 0 % (ref 0.0–0.2)

## 2020-02-11 LAB — CMP (CANCER CENTER ONLY)
ALT: 10 U/L (ref 0–44)
AST: 17 U/L (ref 15–41)
Albumin: 4.1 g/dL (ref 3.5–5.0)
Alkaline Phosphatase: 68 U/L (ref 38–126)
Anion gap: 9 (ref 5–15)
BUN: 12 mg/dL (ref 8–23)
CO2: 26 mmol/L (ref 22–32)
Calcium: 9.6 mg/dL (ref 8.9–10.3)
Chloride: 100 mmol/L (ref 98–111)
Creatinine: 1.07 mg/dL (ref 0.61–1.24)
GFR, Est AFR Am: >60 mL/min (ref >60)
GFR, Estimated: >60 mL/min (ref >60)
Glucose, Bld: 95 mg/dL (ref 70–99)
Potassium: 4.2 mmol/L (ref 3.5–5.1)
Sodium: 135 mmol/L (ref 135–145)
Total Bilirubin: 0.8 mg/dL (ref 0.3–1.2)
Total Protein: 6.5 g/dL (ref 6.5–8.1)

## 2020-02-11 LAB — TSH: TSH: 2.368 u[IU]/mL (ref 0.320–4.118)

## 2020-02-11 MED ORDER — HEPARIN SOD (PORK) LOCK FLUSH 100 UNIT/ML IV SOLN
500.0000 [IU] | Freq: Once | INTRAVENOUS | Status: AC | PRN
  Administered 2020-02-11: 500 [IU]
  Filled 2020-02-11: qty 5

## 2020-02-11 MED ORDER — PALONOSETRON HCL INJECTION 0.25 MG/5ML
0.2500 mg | Freq: Once | INTRAVENOUS | Status: AC
  Administered 2020-02-11: 0.25 mg via INTRAVENOUS

## 2020-02-11 MED ORDER — SODIUM CHLORIDE 0.9 % IV SOLN
3.2000 mg/m2 | Freq: Once | INTRAVENOUS | Status: AC
  Administered 2020-02-11: 5.55 mg via INTRAVENOUS
  Filled 2020-02-11: qty 11.1

## 2020-02-11 MED ORDER — SODIUM CHLORIDE 0.9 % IV SOLN
200.0000 mg | Freq: Once | INTRAVENOUS | Status: AC
  Administered 2020-02-11: 200 mg via INTRAVENOUS
  Filled 2020-02-11: qty 8

## 2020-02-11 MED ORDER — SODIUM CHLORIDE 0.9 % IV SOLN
10.0000 mg | Freq: Once | INTRAVENOUS | Status: AC
  Administered 2020-02-11: 10 mg via INTRAVENOUS
  Filled 2020-02-11: qty 10

## 2020-02-11 MED ORDER — SODIUM CHLORIDE 0.9% FLUSH
10.0000 mL | INTRAVENOUS | Status: DC | PRN
  Administered 2020-02-11: 10 mL
  Filled 2020-02-11: qty 10

## 2020-02-11 MED ORDER — PALONOSETRON HCL INJECTION 0.25 MG/5ML
INTRAVENOUS | Status: AC
  Filled 2020-02-11: qty 5

## 2020-02-11 MED ORDER — SODIUM CHLORIDE 0.9 % IV SOLN
Freq: Once | INTRAVENOUS | Status: AC
  Filled 2020-02-11: qty 250

## 2020-02-11 NOTE — Telephone Encounter (Signed)
Appointments scheduled calendar printed per 9/7 los

## 2020-02-11 NOTE — Progress Notes (Signed)
Oncology Nurse Navigator Documentation  Oncology Nurse Navigator Flowsheets 02/11/2020  Abnormal Finding Date -  Confirmed Diagnosis Date -  Diagnosis Status -  Planned Course of Treatment -  Phase of Treatment -  Chemotherapy Actual Start Date: -  Radiation Actual Start Date: -  Navigator Follow Up Date: 03/02/2020  Navigator Follow Up Reason: Follow-up Appointment;Chemotherapy  Navigator [employer] manager, [employer] Encounter Type Treatment  Telephone -  Treatment Initiated Date -  Patient Visit Type MedOnc  Treatment Phase Active Tx  Barriers/Navigation Needs Coordination of Care  Education -  Interventions Psycho-Social Support  Acuity Level 2-Minimal Needs (1-2 Barriers Identified)  Referrals -  Coordination of Care -  Education Method -  Support Groups/Services Friends and Family  Time Spent with Patient 15

## 2020-02-11 NOTE — Patient Instructions (Signed)

## 2020-02-11 NOTE — Progress Notes (Signed)
Hematology and Oncology Follow Up Visit  Walter Hernandez [id number] 06/15/1950 69 y.o. 02/11/2020   Principle Diagnosis:  Stage IB (T2aN0M0) superficial spreading melanoma of the left lower leg  Small Cell Lung Cancer -- Extensive Stage -- lung/liver/brain mets  Past Therapy: Cranial XRT -- 06/19/2019 thru 07/02/2019  Current Therapy:        Carboplatin/VP-16/Tecentriq -- started on 07/07/2018, s/p cycle #4 Tecentriq - maintenance -- cycle#1 -- start on 10/01/2019 - d/c on 10/21/2019 Zepzelca/Keytruda -- q 3 wk --s/p cycle #5 - started on 10/28/2019   Interim History:  Walter Hernandez is here today for follow-up.  He is doing quite well.  He had a very nice Labor Day weekend.  He is eating well.  He is not losing weight.  He has had no problems with nausea or vomiting.  There has been no issues with cough or shortness of breath.  He has had no problems with headache.  There has been no problems with leg swelling.  He has had no rashes.  Overall, his performance status is ECOG 1.    Medications:  Allergies as of 02/11/2020   No Known Allergies     Medication List       Accurate as of February 11, 2020  9:23 AM. If you have any questions, ask your nurse or doctor.        STOP taking these medications   doxycycline 100 MG tablet Commonly known as: VIBRA-TABS Stopped by: Volanda Napoleon, MD     TAKE these medications   acetaminophen 500 MG tablet Commonly known as: TYLENOL Take 1,000 mg by mouth every 6 (six) hours as needed for moderate pain.   ALPRAZolam 0.5 MG tablet Commonly known as: XANAX Take 1 tablet (0.5 mg total) by mouth every 6 (six) hours as needed for anxiety.   dronabinol 5 MG capsule Commonly known as: MARINOL Take 1 capsule (5 mg total) by mouth 2 (two) times daily before a meal.   finasteride 5 MG tablet Commonly known as: PROSCAR TAKE 1 TABLET BY MOUTH EVERY DAY   furosemide 20 MG tablet Commonly known as: LASIX Take 2 tablets (40 mg total) by mouth  daily.   LORazepam 0.5 MG tablet Commonly known as: ATIVAN Take 1 tablet (0.5 mg total) by mouth every 6 (six) hours as needed for anxiety.   memantine 10 MG tablet Commonly known as: NAMENDA Take 10 mg by mouth daily. What changed: Another medication with the same name was removed. Continue taking this medication, and follow the directions you see here. Changed by: Volanda Napoleon, MD   multivitamin capsule Take 1 capsule by mouth daily.   nicotine 21 mg/24hr patch Commonly known as: NICODERM CQ - dosed in mg/24 hours PLACE 1 PATCH (21 MG TOTAL) ONTO THE SKIN DAILY.   omeprazole 40 MG capsule Commonly known as: PRILOSEC Take 1 capsule (40 mg total) by mouth 2 (two) times daily.   ondansetron 4 MG tablet Commonly known as: ZOFRAN Take 1 tablet (4 mg total) by mouth every 6 (six) hours as needed for nausea.   OPCON-A OP Place 2 drops into both eyes as needed (for dry eyes).   potassium chloride SA 20 MEQ tablet Commonly known as: KLOR-CON Take 1 tablet (20 mEq total) by mouth daily.   tamsulosin 0.4 MG Caps capsule Commonly known as: FLOMAX Take 1 capsule (0.4 mg total) by mouth daily.       Allergies: No Known Allergies  Past Medical History, Surgical history,  Social history, and Family History were reviewed and updated.  Review of Systems: Review of Systems  Constitutional: Positive for weight loss.  HENT: Negative.   Eyes: Negative.   Respiratory: Negative.   Cardiovascular: Negative.   Gastrointestinal: Negative.   Genitourinary: Negative.   Musculoskeletal: Negative.   Skin: Negative.   Neurological: Negative.   Endo/Heme/Allergies: Negative.   Psychiatric/Behavioral: Negative.      Physical Exam:  weight is 168 lb (76.2 kg). His oral temperature is 98.2 F (36.8 C). His blood pressure is 141/71 (abnormal) and his pulse is 82. His respiration is 18 and oxygen saturation is 99%.   Wt Readings from Last 3 Encounters:  02/11/20 168 lb (76.2 kg)    01/20/20 166 lb (75.3 kg)  12/30/19 163 lb (73.9 kg)    Physical Exam Vitals reviewed.  HENT:     Head: Normocephalic and atraumatic.  Eyes:     Pupils: Pupils are equal, round, and reactive to light.  Cardiovascular:     Rate and Rhythm: Normal rate and regular rhythm.     Heart sounds: Normal heart sounds.  Pulmonary:     Effort: Pulmonary effort is normal.     Breath sounds: Normal breath sounds.  Abdominal:     General: Bowel sounds are normal.     Palpations: Abdomen is soft.  Musculoskeletal:        General: No tenderness or deformity. Normal range of motion.     Cervical back: Normal range of motion.  Lymphadenopathy:     Cervical: No cervical adenopathy.  Skin:    General: Skin is warm and dry.     Findings: No erythema or rash.  Neurological:     Mental Status: He is alert and oriented to person, place, and time.  Psychiatric:        Behavior: Behavior normal.        Thought Content: Thought content normal.        Judgment: Judgment normal.      Lab Results  Component Value Date   WBC 4.8 02/11/2020   HGB 10.9 (L) 02/11/2020   HCT 30.7 (L) 02/11/2020   MCV 100.3 (H) 02/11/2020   PLT 235 02/11/2020   Lab Results  Component Value Date   FERRITIN 2,549 (H) 10/21/2019   IRON 43 10/21/2019   TIBC 225 10/21/2019   UIBC 182 10/21/2019   IRONPCTSAT 19 (L) 10/21/2019   Lab Results  Component Value Date   RETICCTPCT 1.5 08/07/2019   RBC 3.06 (L) 02/11/2020   No results found for: KPAFRELGTCHN, LAMBDASER, KAPLAMBRATIO No results found for: IGGSERUM, IGA, IGMSERUM No results found for: Odetta Pink, SPEI   Chemistry      Component Value Date/Time   NA 135 02/11/2020 0840   NA 142 01/30/2017 0804   K 4.2 02/11/2020 0840   K 4.1 01/30/2017 0804   CL 100 02/11/2020 0840   CL 105 01/25/2016 0926   CO2 26 02/11/2020 0840   CO2 29 01/30/2017 0804   BUN 12 02/11/2020 0840   BUN 6.9 (L) 01/30/2017  0804   CREATININE 1.07 02/11/2020 0840   CREATININE 0.8 01/30/2017 0804      Component Value Date/Time   CALCIUM 9.6 02/11/2020 0840   CALCIUM 10.5 (H) 01/30/2017 0804   ALKPHOS 68 02/11/2020 0840   ALKPHOS 79 01/30/2017 0804   AST 17 02/11/2020 0840   AST 34 01/30/2017 0804   ALT 10 02/11/2020 0840   ALT  18 01/30/2017 0804   BILITOT 0.8 02/11/2020 0840   BILITOT 1.49 (H) 01/30/2017 0804       Impression and Plan: Walter Hernandez is a very pleasant 69 yo caucasian gentleman with history of stage 1b melanoma of the left lower leg.   This really is not a problem right now.  I am hopeful that we are still seeing a response to treatment.  We will find out when we do our next PET scan.    His quality of life is doing much better which is a blessing.  We will go ahead with his 6th cycle of Zepzelka.  Again he is doing quite nicely with this.  Hopefully, we will see that he is responding.  We will plan for follow-up in 3 weeks.     Volanda Napoleon, MD 9/7/20219:23 AM

## 2020-02-11 NOTE — Patient Instructions (Signed)
[facility] Discharge Instructions for Patients Receiving Chemotherapy  Today you received the following chemotherapy agents Lurbinectidin, Keytruda To help prevent nausea and vomiting after your treatment, we encourage you to take your nausea medication    If you develop nausea and vomiting that is not controlled by your nausea medication, call the clinic.   BELOW ARE SYMPTOMS THAT SHOULD BE REPORTED IMMEDIATELY:  *FEVER GREATER THAN 100.5 F  *CHILLS WITH OR WITHOUT FEVER  NAUSEA AND VOMITING THAT IS NOT CONTROLLED WITH YOUR NAUSEA MEDICATION  *UNUSUAL SHORTNESS OF BREATH  *UNUSUAL BRUISING OR BLEEDING  TENDERNESS IN MOUTH AND THROAT WITH OR WITHOUT PRESENCE OF ULCERS  *URINARY PROBLEMS  *BOWEL PROBLEMS  UNUSUAL RASH Items with * indicate a potential emergency and should be followed up as soon as possible.  Feel free to call the clinic should you have any questions or concerns. The clinic phone number is [phone].  Please show the [facility] at check-in to the Emergency Department and triage nurse.

## 2020-03-02 ENCOUNTER — Inpatient Hospital Stay: Payer: Medicare Other

## 2020-03-02 ENCOUNTER — Encounter: Payer: Self-pay | Admitting: *Deleted

## 2020-03-02 ENCOUNTER — Inpatient Hospital Stay (HOSPITAL_BASED_OUTPATIENT_CLINIC_OR_DEPARTMENT_OTHER): Payer: Medicare Other | Admitting: Hematology & Oncology

## 2020-03-02 ENCOUNTER — Other Ambulatory Visit: Payer: Self-pay

## 2020-03-02 ENCOUNTER — Encounter: Payer: Self-pay | Admitting: Hematology & Oncology

## 2020-03-02 VITALS — HR 82

## 2020-03-02 DIAGNOSIS — C3431 Malignant neoplasm of lower lobe, right bronchus or lung: Secondary | ICD-10-CM | POA: Diagnosis not present

## 2020-03-02 DIAGNOSIS — C3490 Malignant neoplasm of unspecified part of unspecified bronchus or lung: Secondary | ICD-10-CM | POA: Diagnosis not present

## 2020-03-02 DIAGNOSIS — Z5111 Encounter for antineoplastic chemotherapy: Secondary | ICD-10-CM | POA: Diagnosis not present

## 2020-03-02 DIAGNOSIS — C4372 Malignant melanoma of left lower limb, including hip: Secondary | ICD-10-CM | POA: Diagnosis not present

## 2020-03-02 DIAGNOSIS — C7931 Secondary malignant neoplasm of brain: Secondary | ICD-10-CM | POA: Diagnosis not present

## 2020-03-02 DIAGNOSIS — Z5112 Encounter for antineoplastic immunotherapy: Secondary | ICD-10-CM | POA: Diagnosis not present

## 2020-03-02 DIAGNOSIS — C787 Secondary malignant neoplasm of liver and intrahepatic bile duct: Secondary | ICD-10-CM | POA: Diagnosis not present

## 2020-03-02 LAB — CMP (CANCER CENTER ONLY)
ALT: 13 U/L (ref 0–44)
AST: 19 U/L (ref 15–41)
Albumin: 4.4 g/dL (ref 3.5–5.0)
Alkaline Phosphatase: 67 U/L (ref 38–126)
Anion gap: 11 (ref 5–15)
BUN: 10 mg/dL (ref 8–23)
CO2: 26 mmol/L (ref 22–32)
Calcium: 9.8 mg/dL (ref 8.9–10.3)
Chloride: 99 mmol/L (ref 98–111)
Creatinine: 1.1 mg/dL (ref 0.61–1.24)
GFR, Est AFR Am: >60 mL/min (ref >60)
GFR, Estimated: >60 mL/min (ref >60)
Glucose, Bld: 97 mg/dL (ref 70–99)
Potassium: 3.9 mmol/L (ref 3.5–5.1)
Sodium: 136 mmol/L (ref 135–145)
Total Bilirubin: 0.5 mg/dL (ref 0.3–1.2)
Total Protein: 7.1 g/dL (ref 6.5–8.1)

## 2020-03-02 LAB — CBC WITH DIFFERENTIAL (CANCER CENTER ONLY)
Abs Immature Granulocytes: 0.34 10*3/uL — ABNORMAL HIGH (ref 0.00–0.07)
Basophils Absolute: 0.1 10*3/uL (ref 0.0–0.1)
Basophils Relative: 2 %
Eosinophils Absolute: 0 10*3/uL (ref 0.0–0.5)
Eosinophils Relative: 1 %
HCT: 29.2 % — ABNORMAL LOW (ref 39.0–52.0)
Hemoglobin: 10.4 g/dL — ABNORMAL LOW (ref 13.0–17.0)
Immature Granulocytes: 11 %
Lymphocytes Relative: 37 %
Lymphs Abs: 1.2 10*3/uL (ref 0.7–4.0)
MCH: 35.7 pg — ABNORMAL HIGH (ref 26.0–34.0)
MCHC: 35.6 g/dL (ref 30.0–36.0)
MCV: 100.3 fL — ABNORMAL HIGH (ref 80.0–100.0)
Monocytes Absolute: 0.8 10*3/uL (ref 0.1–1.0)
Monocytes Relative: 24 %
Neutro Abs: 0.8 10*3/uL — ABNORMAL LOW (ref 1.7–7.7)
Neutrophils Relative %: 25 %
Platelet Count: 289 10*3/uL (ref 150–400)
RBC: 2.91 MIL/uL — ABNORMAL LOW (ref 4.22–5.81)
RDW: 13.9 % (ref 11.5–15.5)
WBC Count: 3.2 10*3/uL — ABNORMAL LOW (ref 4.0–10.5)
nRBC: 0 % (ref 0.0–0.2)

## 2020-03-02 LAB — LACTATE DEHYDROGENASE: LDH: 207 U/L — ABNORMAL HIGH (ref 98–192)

## 2020-03-02 MED ORDER — SODIUM CHLORIDE 0.9 % IV SOLN
Freq: Once | INTRAVENOUS | Status: AC
  Filled 2020-03-02: qty 250

## 2020-03-02 MED ORDER — SODIUM CHLORIDE 0.9% FLUSH
10.0000 mL | INTRAVENOUS | Status: DC | PRN
  Administered 2020-03-02: 10 mL
  Filled 2020-03-02: qty 10

## 2020-03-02 MED ORDER — SODIUM CHLORIDE 0.9 % IV SOLN
200.0000 mg | Freq: Once | INTRAVENOUS | Status: AC
  Administered 2020-03-02: 200 mg via INTRAVENOUS
  Filled 2020-03-02: qty 8

## 2020-03-02 MED ORDER — HEPARIN SOD (PORK) LOCK FLUSH 100 UNIT/ML IV SOLN
500.0000 [IU] | Freq: Once | INTRAVENOUS | Status: AC | PRN
  Administered 2020-03-02: 500 [IU]
  Filled 2020-03-02: qty 5

## 2020-03-02 MED ORDER — SODIUM CHLORIDE 0.9 % IV SOLN
3.2000 mg/m2 | Freq: Once | INTRAVENOUS | Status: AC
  Administered 2020-03-02: 5.55 mg via INTRAVENOUS
  Filled 2020-03-02: qty 11.1

## 2020-03-02 MED ORDER — PALONOSETRON HCL INJECTION 0.25 MG/5ML
0.2500 mg | Freq: Once | INTRAVENOUS | Status: AC
  Administered 2020-03-02: 0.25 mg via INTRAVENOUS

## 2020-03-02 MED ORDER — PALONOSETRON HCL INJECTION 0.25 MG/5ML
INTRAVENOUS | Status: AC
  Filled 2020-03-02: qty 5

## 2020-03-02 MED ORDER — HEPARIN SOD (PORK) LOCK FLUSH 100 UNIT/ML IV SOLN
250.0000 [IU] | Freq: Once | INTRAVENOUS | Status: DC | PRN
  Filled 2020-03-02: qty 5

## 2020-03-02 MED ORDER — SODIUM CHLORIDE 0.9 % IV SOLN
10.0000 mg | Freq: Once | INTRAVENOUS | Status: AC
  Administered 2020-03-02: 10 mg via INTRAVENOUS
  Filled 2020-03-02: qty 10

## 2020-03-02 NOTE — Progress Notes (Signed)
Hematology and Oncology Follow Up Visit  Walter Hernandez [id number] 07-20-1950 69 y.o. 03/02/2020   Principle Diagnosis:  Stage IB (T2aN0M0) superficial spreading melanoma of the left lower leg  Small Cell Lung Cancer -- Extensive Stage -- lung/liver/brain mets  Past Therapy: Cranial XRT -- 06/19/2019 thru 07/02/2019  Current Therapy:        Carboplatin/VP-16/Tecentriq -- started on 07/07/2018, s/p cycle #4 Tecentriq - maintenance -- cycle#1 -- start on 10/01/2019 - d/c on 10/21/2019 Zepzelca/Keytruda -- q 3 wk --s/p cycle #6 - started on 10/28/2019   Interim History:  Walter Hernandez is here today for follow-up.  He is doing quite well.  He is eating well.  He still has taste buds I do not work as well as he would like.  He has had no problems with nausea or vomiting.  There has been no pain.  He has had no change in bowel or bladder habits.  He has had no leg swelling.  There has been no headaches.  He has had no bleeding.  We probably have to set him up with his scans to make sure everything is going okay for him.  Overall, his performance status is ECOG 1.    Medications:  Allergies as of 03/02/2020   No Known Allergies     Medication List       Accurate as of March 02, 2020 12:16 PM. If you have any questions, ask your nurse or doctor.        acetaminophen 500 MG tablet Commonly known as: TYLENOL Take 1,000 mg by mouth every 6 (six) hours as needed for moderate pain.   ALPRAZolam 0.5 MG tablet Commonly known as: XANAX Take 1 tablet (0.5 mg total) by mouth every 6 (six) hours as needed for anxiety.   dronabinol 5 MG capsule Commonly known as: MARINOL Take 1 capsule (5 mg total) by mouth 2 (two) times daily before a meal.   finasteride 5 MG tablet Commonly known as: PROSCAR TAKE 1 TABLET BY MOUTH EVERY DAY   furosemide 20 MG tablet Commonly known as: LASIX Take 2 tablets (40 mg total) by mouth daily.   LORazepam 0.5 MG tablet Commonly known as: ATIVAN Take 1  tablet (0.5 mg total) by mouth every 6 (six) hours as needed for anxiety.   memantine 10 MG tablet Commonly known as: NAMENDA Take 10 mg by mouth daily.   multivitamin capsule Take 1 capsule by mouth daily.   nicotine 21 mg/24hr patch Commonly known as: NICODERM CQ - dosed in mg/24 hours PLACE 1 PATCH (21 MG TOTAL) ONTO THE SKIN DAILY.   omeprazole 40 MG capsule Commonly known as: PRILOSEC Take 1 capsule (40 mg total) by mouth 2 (two) times daily.   ondansetron 4 MG tablet Commonly known as: ZOFRAN Take 1 tablet (4 mg total) by mouth every 6 (six) hours as needed for nausea.   OPCON-A OP Place 2 drops into both eyes as needed (for dry eyes).   potassium chloride SA 20 MEQ tablet Commonly known as: KLOR-CON Take 1 tablet (20 mEq total) by mouth daily.   tamsulosin 0.4 MG Caps capsule Commonly known as: FLOMAX Take 1 capsule (0.4 mg total) by mouth daily.       Allergies: No Known Allergies  Past Medical History, Surgical history, Social history, and Family History were reviewed and updated.  Review of Systems: Review of Systems  Constitutional: Positive for weight loss.  HENT: Negative.   Eyes: Negative.   Respiratory: Negative.  Cardiovascular: Negative.   Gastrointestinal: Negative.   Genitourinary: Negative.   Musculoskeletal: Negative.   Skin: Negative.   Neurological: Negative.   Endo/Heme/Allergies: Negative.   Psychiatric/Behavioral: Negative.      Physical Exam:  vitals were not taken for this visit.   Wt Readings from Last 3 Encounters:  03/02/20 165 lb (74.8 kg)  02/11/20 168 lb (76.2 kg)  01/20/20 166 lb (75.3 kg)    Physical Exam Vitals reviewed.  HENT:     Head: Normocephalic and atraumatic.  Eyes:     Pupils: Pupils are equal, round, and reactive to light.  Cardiovascular:     Rate and Rhythm: Normal rate and regular rhythm.     Heart sounds: Normal heart sounds.  Pulmonary:     Effort: Pulmonary effort is normal.     Breath  sounds: Normal breath sounds.  Abdominal:     General: Bowel sounds are normal.     Palpations: Abdomen is soft.  Musculoskeletal:        General: No tenderness or deformity. Normal range of motion.     Cervical back: Normal range of motion.  Lymphadenopathy:     Cervical: No cervical adenopathy.  Skin:    General: Skin is warm and dry.     Findings: No erythema or rash.  Neurological:     Mental Status: He is alert and oriented to person, place, and time.  Psychiatric:        Behavior: Behavior normal.        Thought Content: Thought content normal.        Judgment: Judgment normal.      Lab Results  Component Value Date   WBC 3.2 (L) 03/02/2020   HGB 10.4 (L) 03/02/2020   HCT 29.2 (L) 03/02/2020   MCV 100.3 (H) 03/02/2020   PLT 289 03/02/2020   Lab Results  Component Value Date   FERRITIN 2,549 (H) 10/21/2019   IRON 43 10/21/2019   TIBC 225 10/21/2019   UIBC 182 10/21/2019   IRONPCTSAT 19 (L) 10/21/2019   Lab Results  Component Value Date   RETICCTPCT 1.5 08/07/2019   RBC 2.91 (L) 03/02/2020   No results found for: KPAFRELGTCHN, LAMBDASER, KAPLAMBRATIO No results found for: IGGSERUM, IGA, IGMSERUM No results found for: Odetta Pink, SPEI   Chemistry      Component Value Date/Time   NA 136 03/02/2020 1135   NA 142 01/30/2017 0804   K 3.9 03/02/2020 1135   K 4.1 01/30/2017 0804   CL 99 03/02/2020 1135   CL 105 01/25/2016 0926   CO2 26 03/02/2020 1135   CO2 29 01/30/2017 0804   BUN 10 03/02/2020 1135   BUN 6.9 (L) 01/30/2017 0804   CREATININE 1.10 03/02/2020 1135   CREATININE 0.8 01/30/2017 0804      Component Value Date/Time   CALCIUM 9.8 03/02/2020 1135   CALCIUM 10.5 (H) 01/30/2017 0804   ALKPHOS 67 03/02/2020 1135   ALKPHOS 79 01/30/2017 0804   AST 19 03/02/2020 1135   AST 34 01/30/2017 0804   ALT 13 03/02/2020 1135   ALT 18 01/30/2017 0804   BILITOT 0.5 03/02/2020 1135   BILITOT 1.49 (H)  01/30/2017 0804       Impression and Plan: Walter Hernandez is a very pleasant 69 yo caucasian gentleman with history of stage 1b melanoma of the left lower leg.   This really is not a problem right now.  I am hopeful that we  are still seeing a response to treatment.  We will find out when we do our next PET scan.    His quality of life is doing much better which is a blessing.  We will go ahead with his 7th cycle of Zepzelka.  Again he is doing quite nicely with this.  Hopefully, we will see that he is responding.  We will plan for follow-up in 3 weeks.     Volanda Napoleon, MD 9/27/202112:16 PM

## 2020-03-02 NOTE — Progress Notes (Signed)
OK to treat with today's blood test results per Dr. Ennever. 

## 2020-03-02 NOTE — Progress Notes (Signed)
Oncology Nurse Navigator Documentation  Oncology Nurse Navigator Flowsheets 03/02/2020  Abnormal Finding Date -  Confirmed Diagnosis Date -  Diagnosis Status -  Planned Course of Treatment -  Phase of Treatment -  Chemotherapy Actual Start Date: -  Radiation Actual Start Date: -  Navigator Follow Up Date: 04/01/2020  Navigator Follow Up Reason: Follow-up Appointment;Chemotherapy  Navigator Location [facility]  Navigator Encounter Type Follow-up Appt  Telephone -  Treatment Initiated Date -  Patient Visit Type MedOnc  Treatment Phase Active Tx  Barriers/Navigation Needs Coordination of Care  Education -  Interventions Psycho-Social Support  Acuity Level 2-Minimal Needs (1-2 Barriers Identified)  Referrals -  Coordination of Care -  Education Method -  Support Groups/Services Friends and Family  Time Spent with Patient 30

## 2020-03-02 NOTE — Patient Instructions (Signed)

## 2020-03-03 ENCOUNTER — Inpatient Hospital Stay: Payer: Medicare Other | Admitting: Hematology & Oncology

## 2020-03-03 ENCOUNTER — Inpatient Hospital Stay: Payer: Medicare Other

## 2020-03-03 ENCOUNTER — Encounter: Payer: Self-pay | Admitting: *Deleted

## 2020-03-03 NOTE — Progress Notes (Signed)
Colletta Maryland called to question the MRI ordered by Dr Marin Olp. She scheduled the PET scan but felt that the MRI would be ordered for the beginning of November per Dr Johny Shears protocol and not in October.   Contacted Ashlyn Bruning PA with Dr Tammi Klippel and she confirms that they require a special protocol. Will cancel Dr Antonieta Pert MRI order and defer MRI to them.   Dr Marin Olp notified and Colletta Maryland notified.   Oncology Nurse Navigator Documentation  Oncology Nurse Navigator Flowsheets 03/03/2020  Abnormal Finding Date -  Confirmed Diagnosis Date -  Diagnosis Status -  Planned Course of Treatment -  Phase of Treatment -  Chemotherapy Actual Start Date: -  Radiation Actual Start Date: -  Navigator Follow Up Date: 03/16/2020  Navigator Follow Up Reason: Radiology  Navigator Location [facility]  Navigator Encounter Type Telephone;Appt/Treatment Plan Review  Telephone Incoming Call  Treatment Initiated Date -  Patient Visit Type MedOnc  Treatment Phase Active Tx  Barriers/Navigation Needs Coordination of Care  Education -  Interventions Coordination of Care  Acuity Level 2-Minimal Needs (1-2 Barriers Identified)  Referrals -  Coordination of Care Radiology  Education Method -  Support Groups/Services Friends and Family  Time Spent with Patient 30

## 2020-03-12 ENCOUNTER — Other Ambulatory Visit: Payer: Self-pay | Admitting: Hematology & Oncology

## 2020-03-16 ENCOUNTER — Encounter (HOSPITAL_COMMUNITY)
Admission: RE | Admit: 2020-03-16 | Discharge: 2020-03-16 | Disposition: A | Discharge status: Home / Self Care | Payer: Medicare Other | Source: Ambulatory Visit | Attending: Hematology & Oncology | Admitting: Hematology & Oncology

## 2020-03-16 ENCOUNTER — Encounter: Payer: Self-pay | Admitting: *Deleted

## 2020-03-16 ENCOUNTER — Other Ambulatory Visit: Payer: Self-pay

## 2020-03-16 ENCOUNTER — Other Ambulatory Visit (HOSPITAL_COMMUNITY): Payer: Medicare Other

## 2020-03-16 DIAGNOSIS — I700 Atherosclerosis of aorta: Secondary | ICD-10-CM | POA: Insufficient documentation

## 2020-03-16 DIAGNOSIS — C787 Secondary malignant neoplasm of liver and intrahepatic bile duct: Secondary | ICD-10-CM | POA: Insufficient documentation

## 2020-03-16 DIAGNOSIS — K5730 Diverticulosis of large intestine without perforation or abscess without bleeding: Secondary | ICD-10-CM | POA: Insufficient documentation

## 2020-03-16 DIAGNOSIS — R590 Localized enlarged lymph nodes: Secondary | ICD-10-CM | POA: Diagnosis not present

## 2020-03-16 DIAGNOSIS — I2510 Atherosclerotic heart disease of native coronary artery without angina pectoris: Secondary | ICD-10-CM | POA: Insufficient documentation

## 2020-03-16 DIAGNOSIS — C3490 Malignant neoplasm of unspecified part of unspecified bronchus or lung: Secondary | ICD-10-CM | POA: Diagnosis not present

## 2020-03-16 DIAGNOSIS — M5136 Other intervertebral disc degeneration, lumbar region: Secondary | ICD-10-CM | POA: Diagnosis not present

## 2020-03-16 DIAGNOSIS — M47816 Spondylosis without myelopathy or radiculopathy, lumbar region: Secondary | ICD-10-CM | POA: Insufficient documentation

## 2020-03-16 LAB — GLUCOSE, CAPILLARY: Glucose-Capillary: 101 mg/dL — ABNORMAL HIGH (ref 70–99)

## 2020-03-16 IMAGING — CT NM PET TUM IMG RESTAG (PS) SKULL BASE T - THIGH
1 of 7 series · 1 of 25 positions shown · non-contrast
Comparison: Multiple exams, including [DATE]

CLINICAL DATA: Subsequent treatment strategy for non-small cell
lung cancer.

Current therapy: Carboplatin/[RO]/Tecentriq -- started on
[DATE]Zepzelca/Keytruda -- q 3 wk --- started on [DATE]
EXAM:
NUCLEAR MEDICINE PET SKULL BASE TO THIGH
TECHNIQUE: 8.2 mCi F-18 FDG was injected intravenously. Full-ring PET imaging
was performed from the skull base to thigh after the radiotracer. CT
data was obtained and used for attenuation correction and anatomic
localization.
Fasting blood glucose: 101 mg/dl

[Series 4: ct sk_thigh 5.0 bf37 · axial · 5.0mm · 0.98mm/px · 1 of 237 slices shown]
[im 237/237  brain]
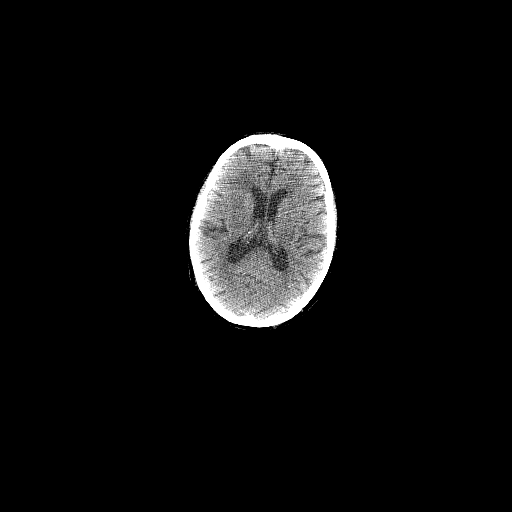

[1 of 25 positions shown; findings below may reference images not displayed]

FINDINGS: Mediastinal blood pool activity: SUV max

Liver activity: SUV max N/A

NECK: The patient has known intracranial lesions based on prior MRI,
but these are near the vertex and above the level of today's
imaging.

Right level V lymph node measures 0.8 cm in short axis on image 50/4
(previously 0.5 cm) maximum SUV of 5.6 (formerly 1.1).

Incidental CT findings: Bilateral common carotid atherosclerotic
calcification. Left mastoid effusion.

CHEST: Small but hypermetabolic left paratracheal, subcarinal, right
hilar, and right infrahilar lymph nodes. Index subcarinal node
cm in short axis on image 77 of series 4 (formerly 0.6 cm) with
maximum SUV 9.8 (formerly 2.3).

Incidental CT findings: Right Port-A-Cath tip: SVC. Coronary, aortic
arch, and branch vessel atherosclerotic vascular disease.

ABDOMEN/PELVIS: There are approximately 12 new metastatic lesions in
the liver. One of the largest is in the right hepatic lobe and
measures 4.8 by 3.5 cm on image 103/4 with maximum SUV of 16.1.

Hypermetabolic and pathologic porta hepatis and portacaval
adenopathy is present. A portacaval node measuring 1.3 cm in short
axis on image 119 of series 4 (formerly 1.2 cm by my measurements)
has maximum SUV of 10.6 (formerly 5.0).

Incidental CT findings: Aortoiliac atherosclerotic vascular disease.
Mild sigmoid colon diverticulosis.

SKELETON: INSERT skip region

Incidental CT findings: Dextroconvex lumbar scoliosis with rotary
component. Thoracolumbar spondylosis and degenerative disc disease.
IMPRESSION: 1. Progression of malignancy with approximately 12 new metastatic
lesions in the liver; new and increased porta hepatis and portacaval
adenopathy; and new and increased thoracic adenopathy in addition to
a right level V lymph node which is small but hypermetabolic.
2. Other imaging findings of potential clinical significance: Left
mastoid effusion. Aortic Atherosclerosis ([RO]-[RO]). Coronary
atherosclerosis. Mild sigmoid colon diverticulosis. Lumbar scoliosis
with spondylosis and degenerative disc disease.

## 2020-03-16 MED ORDER — FLUDEOXYGLUCOSE F - 18 (FDG) INJECTION
8.2200 | Freq: Once | INTRAVENOUS | Status: AC
  Administered 2020-03-16: 8.22 via INTRAVENOUS

## 2020-03-16 NOTE — Progress Notes (Signed)
Oncology Nurse Navigator Documentation  Oncology Nurse Navigator Flowsheets 03/16/2020  Abnormal Finding Date -  Confirmed Diagnosis Date -  Diagnosis Status -  Planned Course of Treatment -  Phase of Treatment -  Chemotherapy Actual Start Date: -  Radiation Actual Start Date: -  Navigator Follow Up Date: 04/01/2020  Navigator Follow Up Reason: Follow-up Appointment  Navigator Location [facility]  Navigator Encounter Type Scan Review  Telephone -  Treatment Initiated Date -  Patient Visit Type MedOnc  Treatment Phase Active Tx  Barriers/Navigation Needs Coordination of Care  Education -  Interventions None Required  Acuity Level 2-Minimal Needs (1-2 Barriers Identified)  Referrals -  Coordination of Care -  Education Method -  Support Groups/Services Friends and Family  Time Spent with Patient 15

## 2020-03-17 ENCOUNTER — Encounter: Payer: Self-pay | Admitting: *Deleted

## 2020-03-17 NOTE — Progress Notes (Signed)
Patient's wife calling the office because she has seen PET scan results and needs to speak to Dr Marin Olp. Dr Marin Olp has left for the day. Message has been sent to Dr Marin Olp requesting he call patients wife.  Colletta Maryland aware that today is MDs half day, and he will get message to call tomorrow.  Oncology Nurse Navigator Documentation  Oncology Nurse Navigator Flowsheets 03/17/2020  Abnormal Finding Date -  Confirmed Diagnosis Date -  Diagnosis Status -  Planned Course of Treatment -  Phase of Treatment -  Chemotherapy Actual Start Date: -  Radiation Actual Start Date: -  Navigator Follow Up Date: 04/01/2020  Navigator Follow Up Reason: Follow-up Appointment  Navigator Location [facility]  Navigator Encounter Type Telephone  Telephone Incoming Call;Diagnostic Results  Treatment Initiated Date -  Patient Visit Type MedOnc  Treatment Phase Active Tx  Barriers/Navigation Needs Coordination of Care  Education Other  Interventions Psycho-Social Support  Acuity Level 2-Minimal Needs (1-2 Barriers Identified)  Referrals -  Coordination of Care -  Education Method Verbal  Support Groups/Services Friends and Family  Time Spent with Patient 15

## 2020-03-18 ENCOUNTER — Telehealth: Payer: Self-pay | Admitting: Radiation Oncology

## 2020-03-18 ENCOUNTER — Other Ambulatory Visit: Payer: Self-pay | Admitting: Radiation Therapy

## 2020-03-18 ENCOUNTER — Other Ambulatory Visit: Payer: Self-pay | Admitting: Hematology & Oncology

## 2020-03-18 DIAGNOSIS — C7949 Secondary malignant neoplasm of other parts of nervous system: Secondary | ICD-10-CM

## 2020-03-18 DIAGNOSIS — C7931 Secondary malignant neoplasm of brain: Secondary | ICD-10-CM

## 2020-03-18 NOTE — Telephone Encounter (Signed)
Unfortunately, not. Due to the number of lesions in the liver, radiation is not the recommended treatment, it will almost certainly be a change in systemic therapy. I would recommend that they reach out to Dr. Marin Olp since they are not scheduled for follow up until 04/01/20. If he recommends a change in therapy, he may want to get things in order with insurance approval prior to that visit. Walter Hernandez

## 2020-03-18 NOTE — Telephone Encounter (Signed)
Phoned patient's home to follow up on earlier conversation with wife, Walter Hernandez. No answer. Left detailed message explaining that due to the number of liver lesions noted on PET radiation is not recommended nor considered helpful. Encouraged they reach out to [facility] since their follow up isn't until later this month. Explained sometimes systemic therapy can be changed to address liver lesions. Encouraged return call for further questions.

## 2020-03-18 NOTE — Progress Notes (Signed)
DISCONTINUE ON PATHWAY REGIMEN - Small Cell Lung     A cycle is every 21 days:     Lurbinectedin   **Always confirm dose/schedule in your pharmacy ordering system**  REASON: Disease Progression PRIOR TREATMENT: [id number]: Lurbinectedin 3.2 mg/m2 IV q21 Days Until Progression or Unacceptable Toxicity TREATMENT RESPONSE: Partial Response (PR)  START OFF PATHWAY REGIMEN - Small Cell Lung   [id number]:Cisplatin + Irinotecan:   A cycle is every 21 days:     Irinotecan      Cisplatin   **Always confirm dose/schedule in your pharmacy ordering system**  Patient Characteristics: Relapsed or Progressive Disease, Third Line and Beyond Therapeutic Status: Relapsed or Progressive Disease Line of Therapy: Third Line and Beyond  Intent of Therapy: Non-Curative / Palliative Intent, Discussed with Patient

## 2020-03-18 NOTE — Telephone Encounter (Signed)
-----   Message from Freeman Caldron, [place] sent at 03/18/2020  1:33 PM EDT ----- Regarding: FW: phone call Please reach out to Ms. Silverthorn to see if you can triage their concerns and let me know if I need to get involved. Thank you! -Ashlyn ----- Message ----- From: Kerri Perches Sent: 03/18/2020   1:12 PM EDT To: Freeman Caldron, PA-C Subject: phone call                                     Hi Ashlyn,   This patient's wife Kennyth Lose) left a message on my vm for you to give her a call.  Ms. Potempa phone number is [phone].  Thanks,  [employer]

## 2020-03-18 NOTE — Telephone Encounter (Signed)
Phoned Colletta Maryland, patient's wife, as requested by Freeman Caldron, PA-C. Colletta Maryland explains her husband's recent PET scan done 03/16/2020 revealed 12 liver mets. She explains they were shocked because the PET done 12/23/19 "was clear." She questions if any of "the three forms of radiation" could be used to treat the disease in her husband's liver.   Also, Colletta Maryland questions if its time for her husband's brain scan. Explained that per Mont Dutton, [facility] navigator, she will be reaching out to her soon to schedule. Jacki verbalized understanding.   Colletta Maryland understands this RN will present her question to her husband's providers and phone them back with her response. Jacki verbalized appreciation for the call.   03/16/2020 PET: There are approximately 12 new metastatic lesions in the liver. One of the largest is in the right hepatic lobe and measures 4.8 by 3.5 cm on image 103/4 with maximum SUV of 16.1.  Diagnosis:   69 yo man with brain metastases from extensive stage small cell lung cancer.  Interval Since Last Radiation:  6 months  1/13-1/26/21:The whole brain was treated to 30Gy in 68fractions of 3Gy

## 2020-03-20 ENCOUNTER — Telehealth: Payer: Self-pay | Admitting: *Deleted

## 2020-03-20 NOTE — Telephone Encounter (Signed)
CALLED PATIENT TO INFORM THAT ASHLYN WILL BE OUT OF THE OFFICE ON 04-15-20, RESCHEDULED TELEPHONE FU FOR 05-06-20 @ 9 AM, SPOKE WITH PATIENT'S WIFE- JACKIE AND SHE IS AWARE OF THIS CHANGE.

## 2020-03-20 NOTE — Progress Notes (Signed)
Pharmacist Chemotherapy Monitoring - Initial Assessment    Anticipated start date: 03/23/20   Regimen:  . Are orders appropriate based on the patient's diagnosis, regimen, and cycle? Yes . Does the plan date match the patient's scheduled date? Yes . Is the sequencing of drugs appropriate? Yes . Are the premedications appropriate for the patient's regimen? Yes . Prior Authorization for treatment is: Approved o If applicable, is the correct biosimilar selected based on the patient's insurance? not applicable  Organ Function and Labs: Marland Kitchen Are dose adjustments needed based on the patient's renal function, hepatic function, or hematologic function? No . Are appropriate labs ordered prior to the start of patient's treatment? Yes . Other organ system assessment, if indicated: N/A . The following baseline labs, if indicated, have been ordered: N/A  Dose Assessment: . Are the drug doses appropriate? Yes . Are the following correct: o Drug concentrations Yes o IV fluid compatible with drug Yes o Administration routes Yes o Timing of therapy Yes . If applicable, does the patient have documented access for treatment and/or plans for port-a-cath placement? yes . If applicable, have lifetime cumulative doses been properly documented and assessed? not applicable Lifetime Dose Tracking  . Carboplatin: 1,760 mg = 0.01 % of the maximum lifetime dose of 999,999,999 mg  o   Toxicity Monitoring/Prevention: . The patient has the following take home antiemetics prescribed: Ondansetron, Prochlorperazine, Dexamethasone and Lorazepam . The patient has the following take home medications prescribed: N/A . Medication allergies and previous infusion related reactions, if applicable, have been reviewed and addressed. Yes . The patient's current medication list has been assessed for drug-drug interactions with their chemotherapy regimen. no significant drug-drug interactions were identified on review.  Order  Review: . Are the treatment plan orders signed? Yes . Is the patient scheduled to see a provider prior to their treatment? Yes  I verify that I have reviewed each item in the above checklist and answered each question accordingly.  Jaire Pinkham, [name] 03/20/2020 7:37 AM

## 2020-03-23 ENCOUNTER — Inpatient Hospital Stay: Payer: Medicare Other

## 2020-03-23 ENCOUNTER — Other Ambulatory Visit: Payer: Self-pay

## 2020-03-23 ENCOUNTER — Inpatient Hospital Stay (HOSPITAL_BASED_OUTPATIENT_CLINIC_OR_DEPARTMENT_OTHER): Payer: Medicare Other | Admitting: Hematology & Oncology

## 2020-03-23 ENCOUNTER — Encounter: Payer: Self-pay | Admitting: Hematology & Oncology

## 2020-03-23 ENCOUNTER — Inpatient Hospital Stay: Payer: Medicare Other | Attending: Hematology & Oncology

## 2020-03-23 ENCOUNTER — Other Ambulatory Visit: Payer: Self-pay | Admitting: Hematology & Oncology

## 2020-03-23 VITALS — BP 117/76 | HR 76 | Temp 98.7°F | Resp 16 | Wt 164.0 lb

## 2020-03-23 DIAGNOSIS — C7931 Secondary malignant neoplasm of brain: Secondary | ICD-10-CM | POA: Insufficient documentation

## 2020-03-23 DIAGNOSIS — R634 Abnormal weight loss: Secondary | ICD-10-CM | POA: Insufficient documentation

## 2020-03-23 DIAGNOSIS — C3490 Malignant neoplasm of unspecified part of unspecified bronchus or lung: Secondary | ICD-10-CM

## 2020-03-23 DIAGNOSIS — C4372 Malignant melanoma of left lower limb, including hip: Secondary | ICD-10-CM | POA: Insufficient documentation

## 2020-03-23 DIAGNOSIS — Z5111 Encounter for antineoplastic chemotherapy: Secondary | ICD-10-CM | POA: Diagnosis not present

## 2020-03-23 DIAGNOSIS — C3431 Malignant neoplasm of lower lobe, right bronchus or lung: Secondary | ICD-10-CM | POA: Diagnosis not present

## 2020-03-23 DIAGNOSIS — Z79899 Other long term (current) drug therapy: Secondary | ICD-10-CM | POA: Diagnosis not present

## 2020-03-23 DIAGNOSIS — C787 Secondary malignant neoplasm of liver and intrahepatic bile duct: Secondary | ICD-10-CM | POA: Diagnosis not present

## 2020-03-23 LAB — CBC WITH DIFFERENTIAL (CANCER CENTER ONLY)
Abs Immature Granulocytes: 0.14 10*3/uL — ABNORMAL HIGH (ref 0.00–0.07)
Basophils Absolute: 0 10*3/uL (ref 0.0–0.1)
Basophils Relative: 1 %
Eosinophils Absolute: 0.1 10*3/uL (ref 0.0–0.5)
Eosinophils Relative: 1 %
HCT: 29.1 % — ABNORMAL LOW (ref 39.0–52.0)
Hemoglobin: 10.2 g/dL — ABNORMAL LOW (ref 13.0–17.0)
Immature Granulocytes: 4 %
Lymphocytes Relative: 29 %
Lymphs Abs: 1.1 10*3/uL (ref 0.7–4.0)
MCH: 36.2 pg — ABNORMAL HIGH (ref 26.0–34.0)
MCHC: 35.1 g/dL (ref 30.0–36.0)
MCV: 103.2 fL — ABNORMAL HIGH (ref 80.0–100.0)
Monocytes Absolute: 0.9 10*3/uL (ref 0.1–1.0)
Monocytes Relative: 25 %
Neutro Abs: 1.5 10*3/uL — ABNORMAL LOW (ref 1.7–7.7)
Neutrophils Relative %: 40 %
Platelet Count: 263 10*3/uL (ref 150–400)
RBC: 2.82 MIL/uL — ABNORMAL LOW (ref 4.22–5.81)
RDW: 13.7 % (ref 11.5–15.5)
WBC Count: 3.7 10*3/uL — ABNORMAL LOW (ref 4.0–10.5)
nRBC: 0 % (ref 0.0–0.2)

## 2020-03-23 LAB — CMP (CANCER CENTER ONLY)
ALT: 12 U/L (ref 0–44)
AST: 22 U/L (ref 15–41)
Albumin: 4.4 g/dL (ref 3.5–5.0)
Alkaline Phosphatase: 74 U/L (ref 38–126)
Anion gap: 11 (ref 5–15)
BUN: 12 mg/dL (ref 8–23)
CO2: 25 mmol/L (ref 22–32)
Calcium: 10.2 mg/dL (ref 8.9–10.3)
Chloride: 100 mmol/L (ref 98–111)
Creatinine: 1.15 mg/dL (ref 0.61–1.24)
GFR, Estimated: >60 mL/min (ref >60)
Glucose, Bld: 116 mg/dL — ABNORMAL HIGH (ref 70–99)
Potassium: 3.8 mmol/L (ref 3.5–5.1)
Sodium: 136 mmol/L (ref 135–145)
Total Bilirubin: 0.5 mg/dL (ref 0.3–1.2)
Total Protein: 7.3 g/dL (ref 6.5–8.1)

## 2020-03-23 LAB — MAGNESIUM: Magnesium: 1.7 mg/dL (ref 1.7–2.4)

## 2020-03-23 LAB — LACTATE DEHYDROGENASE: LDH: 162 U/L (ref 98–192)

## 2020-03-23 MED ORDER — ONDANSETRON HCL 8 MG PO TABS: 8.0000 mg | ORAL_TABLET | Freq: Two times a day (BID) | ORAL | 1 refills | Status: DC | PRN

## 2020-03-23 MED ORDER — SODIUM CHLORIDE 0.9 % IV SOLN
65.0000 mg/m2 | Freq: Once | INTRAVENOUS | Status: AC
  Administered 2020-03-23: 120 mg via INTRAVENOUS
  Filled 2020-03-23: qty 6

## 2020-03-23 MED ORDER — LIDOCAINE-PRILOCAINE 2.5-2.5 % EX CREA: TOPICAL_CREAM | CUTANEOUS | 3 refills | Status: DC

## 2020-03-23 MED ORDER — PROCHLORPERAZINE MALEATE 10 MG PO TABS: 10.0000 mg | ORAL_TABLET | Freq: Four times a day (QID) | ORAL | 1 refills | Status: DC | PRN

## 2020-03-23 MED ORDER — PALONOSETRON HCL INJECTION 0.25 MG/5ML
0.2500 mg | Freq: Once | INTRAVENOUS | Status: AC
  Administered 2020-03-23: 0.25 mg via INTRAVENOUS

## 2020-03-23 MED ORDER — HEPARIN SOD (PORK) LOCK FLUSH 100 UNIT/ML IV SOLN
500.0000 [IU] | Freq: Once | INTRAVENOUS | Status: AC | PRN
  Administered 2020-03-23: 500 [IU]
  Filled 2020-03-23: qty 5

## 2020-03-23 MED ORDER — SODIUM CHLORIDE 0.9 % IV SOLN
Freq: Once | INTRAVENOUS | Status: AC
  Filled 2020-03-23: qty 10

## 2020-03-23 MED ORDER — DEXAMETHASONE 4 MG PO TABS: 8.0000 mg | ORAL_TABLET | Freq: Every day | ORAL | 1 refills | Status: DC

## 2020-03-23 MED ORDER — LORAZEPAM 0.5 MG PO TABS: 0.5000 mg | ORAL_TABLET | Freq: Four times a day (QID) | ORAL | 0 refills | Status: DC | PRN

## 2020-03-23 MED ORDER — SODIUM CHLORIDE 0.9 % IV SOLN
30.0000 mg/m2 | Freq: Once | INTRAVENOUS | Status: AC
  Administered 2020-03-23: 56 mg via INTRAVENOUS
  Filled 2020-03-23: qty 50

## 2020-03-23 MED ORDER — PALONOSETRON HCL INJECTION 0.25 MG/5ML
INTRAVENOUS | Status: AC
  Filled 2020-03-23: qty 5

## 2020-03-23 MED ORDER — LOPERAMIDE HCL 2 MG PO TABS: ORAL_TABLET | ORAL | 1 refills | Status: DC

## 2020-03-23 MED ORDER — SODIUM CHLORIDE 0.9 % IV SOLN
10.0000 mg | Freq: Once | INTRAVENOUS | Status: AC
  Administered 2020-03-23: 10 mg via INTRAVENOUS
  Filled 2020-03-23: qty 10

## 2020-03-23 MED ORDER — SODIUM CHLORIDE 0.9 % IV SOLN
150.0000 mg | Freq: Once | INTRAVENOUS | Status: AC
  Administered 2020-03-23: 150 mg via INTRAVENOUS
  Filled 2020-03-23: qty 150

## 2020-03-23 MED ORDER — SODIUM CHLORIDE 0.9% FLUSH
10.0000 mL | INTRAVENOUS | Status: DC | PRN
  Administered 2020-03-23: 10 mL
  Filled 2020-03-23: qty 10

## 2020-03-23 MED ORDER — SODIUM CHLORIDE 0.9 % IV SOLN
Freq: Once | INTRAVENOUS | Status: AC
  Filled 2020-03-23: qty 250

## 2020-03-23 NOTE — Progress Notes (Signed)
Hematology and Oncology Follow Up Visit  Jcion Buddenhagen [id number] Feb 24, 1951 69 y.o. 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 Brubacher is here today for follow-up.  He looks wonderful.  However, the PET scan clearly shows that he has progressive disease.  The PET scan was done a week ago.  He has a new liver metastasis.  I think the radiologist pointed out 12 liver metastasis.  I think he has some adenopathy also.  Again, he looks good.  He feels good.  His appetite is good.  His weight is holding steady.  He has had no nausea or vomiting.  He does still feel a little bit tired.  He has had no problems with balance.  He goes for his MRI of the brain on November 4.  This [birth year] actually [birth year] birthday.  He has had no bleeding.  He has had no leg swelling.  He has had no cough or shortness of breath.    He has had no numbness or tingling in the hands or feet.  Overall, his performance status is ECOG 1.    Medications:  Allergies as of 03/23/2020   No Known Allergies     Medication List       Accurate as of March 23, 2020  8:25 AM. If you have any questions, ask your nurse or doctor.        acetaminophen 500 MG tablet Commonly known as: TYLENOL Take 1,000 mg by mouth every 6 (six) hours as needed for moderate pain.   ALPRAZolam 0.5 MG tablet Commonly known as: XANAX Take 1 tablet (0.5 mg total) by mouth every 6 (six) hours as needed for anxiety.   dronabinol 5 MG  capsule Commonly known as: MARINOL Take 1 capsule (5 mg total) by mouth 2 (two) times daily before a meal.   finasteride 5 MG tablet Commonly known as: PROSCAR TAKE 1 TABLET BY MOUTH EVERY DAY   furosemide 20 MG tablet Commonly known as: LASIX Take 2 tablets (40 mg total) by mouth daily.   LORazepam 0.5 MG tablet Commonly known as: ATIVAN Take 1 tablet (0.5 mg total) by mouth every 6 (six) hours as needed for anxiety.   memantine 10 MG tablet Commonly known as: NAMENDA Take 10 mg by mouth daily.   multivitamin capsule Take 1 capsule by mouth daily.   nicotine 21 mg/24hr patch Commonly known as: NICODERM CQ - dosed in mg/24 hours PLACE 1 PATCH (21 MG TOTAL) ONTO THE SKIN DAILY.   omeprazole 40 MG capsule Commonly known as: PRILOSEC Take 1 capsule (40 mg total) by mouth 2 (two) times daily.   ondansetron 4 MG tablet Commonly known as: ZOFRAN Take 1 tablet (4 mg total) by mouth every 6 (six) hours as needed for nausea.   OPCON-A OP Place 2 drops into both eyes as needed (for dry eyes).   potassium chloride SA 20 MEQ tablet Commonly known as: KLOR-CON Take 1 tablet (20 mEq total) by mouth daily.  tamsulosin 0.4 MG Caps capsule Commonly known as: FLOMAX Take 1 capsule (0.4 mg total) by mouth daily.       Allergies: No Known Allergies  Past Medical History, Surgical history, Social history, and Family History were reviewed and updated.  Review of Systems: Review of Systems  Constitutional: Positive for weight loss.  HENT: Negative.   Eyes: Negative.   Respiratory: Negative.   Cardiovascular: Negative.   Gastrointestinal: Negative.   Genitourinary: Negative.   Musculoskeletal: Negative.   Skin: Negative.   Neurological: Negative.   Endo/Heme/Allergies: Negative.   Psychiatric/Behavioral: Negative.      Physical Exam:  weight is 164 lb (74.4 kg). His oral temperature is 98.7 F (37.1 C). His blood pressure is 117/76 and his pulse is 76. His respiration  is 16 and oxygen saturation is 96%.   Wt Readings from Last 3 Encounters:  03/23/20 164 lb (74.4 kg)  03/02/20 165 lb (74.8 kg)  02/11/20 168 lb (76.2 kg)    Physical Exam Vitals reviewed.  HENT:     Head: Normocephalic and atraumatic.  Eyes:     Pupils: Pupils are equal, round, and reactive to light.  Cardiovascular:     Rate and Rhythm: Normal rate and regular rhythm.     Heart sounds: Normal heart sounds.  Pulmonary:     Effort: Pulmonary effort is normal.     Breath sounds: Normal breath sounds.  Abdominal:     General: Bowel sounds are normal.     Palpations: Abdomen is soft.  Musculoskeletal:        General: No tenderness or deformity. Normal range of motion.     Cervical back: Normal range of motion.  Lymphadenopathy:     Cervical: No cervical adenopathy.  Skin:    General: Skin is warm and dry.     Findings: No erythema or rash.  Neurological:     Mental Status: He is alert and oriented to person, place, and time.  Psychiatric:        Behavior: Behavior normal.        Thought Content: Thought content normal.        Judgment: Judgment normal.      Lab Results  Component Value Date   WBC 3.7 (L) 03/23/2020   HGB 10.2 (L) 03/23/2020   HCT 29.1 (L) 03/23/2020   MCV 103.2 (H) 03/23/2020   PLT 263 03/23/2020   Lab Results  Component Value Date   FERRITIN 2,549 (H) 10/21/2019   IRON 43 10/21/2019   TIBC 225 10/21/2019   UIBC 182 10/21/2019   IRONPCTSAT 19 (L) 10/21/2019   Lab Results  Component Value Date   RETICCTPCT 1.5 08/07/2019   RBC 2.82 (L) 03/23/2020   No results found for: KPAFRELGTCHN, LAMBDASER, KAPLAMBRATIO No results found for: IGGSERUM, IGA, IGMSERUM No results found for: Odetta Pink, SPEI   Chemistry      Component Value Date/Time   NA 136 03/02/2020 1135   NA 142 01/30/2017 0804   K 3.9 03/02/2020 1135   K 4.1 01/30/2017 0804   CL 99 03/02/2020 1135   CL 105 01/25/2016 0926    CO2 26 03/02/2020 1135   CO2 29 01/30/2017 0804   BUN 10 03/02/2020 1135   BUN 6.9 (L) 01/30/2017 0804   CREATININE 1.10 03/02/2020 1135   CREATININE 0.8 01/30/2017 0804      Component Value Date/Time   CALCIUM 9.8 03/02/2020 1135   CALCIUM 10.5 (H) 01/30/2017 0804  ALKPHOS 67 03/02/2020 1135   ALKPHOS 79 01/30/2017 0804   AST 19 03/02/2020 1135   AST 34 01/30/2017 0804   ALT 13 03/02/2020 1135   ALT 18 01/30/2017 0804   BILITOT 0.5 03/02/2020 1135   BILITOT 1.49 (H) 01/30/2017 0804       Impression and Plan: Mr. Stemm is a very pleasant 69 yo caucasian gentleman with history of stage 1b melanoma of the left lower leg.   This really is not a problem right now.  We will have to make another change with his protocol.  I would now try him on cisplatin/irinotecan.  I think this would be reasonable.  Hopefully, with the cis-platinum and around he can, this will also help with any brain metastasis that he might have.  Again I am happy that his performance status is doing well.  I am glad his quality of life is doing pretty well.  His wife is doing a fantastic job with him.  She is really a great support for him.  She is feeding him well.  She is make sure that he stays well-hydrated.  She is make sure that he gets some exercise.  We will start treatment today.  Hopefully, we will see that he is responding.  I will give him 2 or 3 cycles and then try for another PET scan.       Volanda Napoleon, MD 10/18/20218:25 AM

## 2020-03-23 NOTE — Patient Instructions (Signed)

## 2020-03-23 NOTE — Patient Instructions (Signed)
[facility] Discharge Instructions for Patients Receiving Chemotherapy  Today you received the following chemotherapy agents Irinotecan, Cisplatin  To help prevent nausea and vomiting after your treatment, we encourage you to take your nausea medication    If you develop nausea and vomiting that is not controlled by your nausea medication, call the clinic.   BELOW ARE SYMPTOMS THAT SHOULD BE REPORTED IMMEDIATELY:  *FEVER GREATER THAN 100.5 F  *CHILLS WITH OR WITHOUT FEVER  NAUSEA AND VOMITING THAT IS NOT CONTROLLED WITH YOUR NAUSEA MEDICATION  *UNUSUAL SHORTNESS OF BREATH  *UNUSUAL BRUISING OR BLEEDING  TENDERNESS IN MOUTH AND THROAT WITH OR WITHOUT PRESENCE OF ULCERS  *URINARY PROBLEMS  *BOWEL PROBLEMS  UNUSUAL RASH Items with * indicate a potential emergency and should be followed up as soon as possible.  Feel free to call the clinic should you have any questions or concerns. The clinic phone number is [phone].  Please show the [facility] at check-in to the Emergency Department and triage nurse.

## 2020-03-23 NOTE — Progress Notes (Signed)
Patient has urinated 50 cc after 500 cc hydration fluids.  Spoke with Dr Marin Olp.  Ok to give Cisplatin regardless of UOP today.  Ok to run CDDP with IVF today per Dr Marin Olp

## 2020-03-24 ENCOUNTER — Encounter: Payer: Self-pay | Admitting: *Deleted

## 2020-03-24 ENCOUNTER — Other Ambulatory Visit: Payer: Self-pay | Admitting: *Deleted

## 2020-03-24 DIAGNOSIS — C3490 Malignant neoplasm of unspecified part of unspecified bronchus or lung: Secondary | ICD-10-CM

## 2020-03-24 MED ORDER — LORAZEPAM 0.5 MG PO TABS: 0.5000 mg | ORAL_TABLET | Freq: Four times a day (QID) | ORAL | 0 refills | Status: DC | PRN

## 2020-03-24 NOTE — Progress Notes (Signed)
Called and spoke to patient, and wife, Colletta Maryland. Patient is doing well this morning. He has no complaints. He states he slept better last night than he has recently. He plans to hydrate well today. Colletta Maryland reviewed all his medications with me and clarified the instructions for decadron. She requests a refill on the Ativan as she didn't pick that medication up.  Reviewed the possibility for diarrhea and that this should be treated early. Instructions for imodium reviewed and Colletta Maryland confirmed with teach back. Also instructed her to call the office if the imodium was ineffective in controlling the diarrhea. She understood.   They both know to call the office with any questions or concerns.   Oncology Nurse Navigator Documentation  Oncology Nurse Navigator Flowsheets 03/24/2020  Abnormal Finding Date -  Confirmed Diagnosis Date -  Diagnosis Status -  Planned Course of Treatment -  Phase of Treatment -  Chemotherapy Actual Start Date: -  Radiation Actual Start Date: -  Navigator Follow Up Date: 04/13/2020  Navigator Follow Up Reason: Follow-up Appointment;Chemotherapy  Production assistant, radio Encounter Type Telephone  Telephone Outgoing Call;Patient Update  Treatment Initiated Date -  Patient Visit Type MedOnc  Treatment Phase Active Tx  Barriers/Navigation Needs Coordination of Care;Education  Education Other;Pain/ Symptom Management  Interventions Psycho-Social Support;Education  Acuity Level 2-Minimal Needs (1-2 Barriers Identified)  Referrals -  Coordination of Care -  Education Method Verbal;Teach-back  Support Groups/Services Friends and Family  Time Spent with Patient 30

## 2020-03-27 ENCOUNTER — Other Ambulatory Visit: Payer: Self-pay

## 2020-03-27 DIAGNOSIS — C4372 Malignant melanoma of left lower limb, including hip: Secondary | ICD-10-CM

## 2020-03-30 ENCOUNTER — Encounter: Payer: Self-pay | Admitting: *Deleted

## 2020-03-30 ENCOUNTER — Inpatient Hospital Stay: Payer: Medicare Other

## 2020-03-30 ENCOUNTER — Other Ambulatory Visit: Payer: Self-pay

## 2020-03-30 DIAGNOSIS — C3490 Malignant neoplasm of unspecified part of unspecified bronchus or lung: Secondary | ICD-10-CM

## 2020-03-30 DIAGNOSIS — R634 Abnormal weight loss: Secondary | ICD-10-CM | POA: Diagnosis not present

## 2020-03-30 DIAGNOSIS — C3431 Malignant neoplasm of lower lobe, right bronchus or lung: Secondary | ICD-10-CM | POA: Diagnosis not present

## 2020-03-30 DIAGNOSIS — C4372 Malignant melanoma of left lower limb, including hip: Secondary | ICD-10-CM | POA: Diagnosis not present

## 2020-03-30 DIAGNOSIS — C787 Secondary malignant neoplasm of liver and intrahepatic bile duct: Secondary | ICD-10-CM | POA: Diagnosis not present

## 2020-03-30 DIAGNOSIS — Z5111 Encounter for antineoplastic chemotherapy: Secondary | ICD-10-CM | POA: Diagnosis not present

## 2020-03-30 DIAGNOSIS — C7931 Secondary malignant neoplasm of brain: Secondary | ICD-10-CM | POA: Diagnosis not present

## 2020-03-30 LAB — CBC WITH DIFFERENTIAL (CANCER CENTER ONLY)
Abs Immature Granulocytes: 0.2 10*3/uL — ABNORMAL HIGH (ref 0.00–0.07)
Basophils Absolute: 0 10*3/uL (ref 0.0–0.1)
Basophils Relative: 1 %
Eosinophils Absolute: 0.1 10*3/uL (ref 0.0–0.5)
Eosinophils Relative: 1 %
HCT: 29.8 % — ABNORMAL LOW (ref 39.0–52.0)
Hemoglobin: 10.4 g/dL — ABNORMAL LOW (ref 13.0–17.0)
Immature Granulocytes: 3 %
Lymphocytes Relative: 16 %
Lymphs Abs: 1 10*3/uL (ref 0.7–4.0)
MCH: 35.3 pg — ABNORMAL HIGH (ref 26.0–34.0)
MCHC: 34.9 g/dL (ref 30.0–36.0)
MCV: 101 fL — ABNORMAL HIGH (ref 80.0–100.0)
Monocytes Absolute: 0.7 10*3/uL (ref 0.1–1.0)
Monocytes Relative: 11 %
Neutro Abs: 4.3 10*3/uL (ref 1.7–7.7)
Neutrophils Relative %: 68 %
Platelet Count: 221 10*3/uL (ref 150–400)
RBC: 2.95 MIL/uL — ABNORMAL LOW (ref 4.22–5.81)
RDW: 13.1 % (ref 11.5–15.5)
WBC Count: 6.3 10*3/uL (ref 4.0–10.5)
nRBC: 0 % (ref 0.0–0.2)

## 2020-03-30 LAB — CMP (CANCER CENTER ONLY)
ALT: 17 U/L (ref 0–44)
AST: 16 U/L (ref 15–41)
Albumin: 4.3 g/dL (ref 3.5–5.0)
Alkaline Phosphatase: 68 U/L (ref 38–126)
Anion gap: 9 (ref 5–15)
BUN: 16 mg/dL (ref 8–23)
CO2: 26 mmol/L (ref 22–32)
Calcium: 10.1 mg/dL (ref 8.9–10.3)
Chloride: 99 mmol/L (ref 98–111)
Creatinine: 1.01 mg/dL (ref 0.61–1.24)
GFR, Estimated: >60 mL/min (ref >60)
Glucose, Bld: 119 mg/dL — ABNORMAL HIGH (ref 70–99)
Potassium: 3.7 mmol/L (ref 3.5–5.1)
Sodium: 134 mmol/L — ABNORMAL LOW (ref 135–145)
Total Bilirubin: 0.5 mg/dL (ref 0.3–1.2)
Total Protein: 7.2 g/dL (ref 6.5–8.1)

## 2020-03-30 MED ORDER — SODIUM CHLORIDE 0.9 % IV SOLN
65.0000 mg/m2 | Freq: Once | INTRAVENOUS | Status: AC
  Administered 2020-03-30: 120 mg via INTRAVENOUS
  Filled 2020-03-30: qty 6

## 2020-03-30 MED ORDER — SODIUM CHLORIDE 0.9 % IV SOLN
Freq: Once | INTRAVENOUS | Status: AC
  Filled 2020-03-30: qty 10

## 2020-03-30 MED ORDER — SODIUM CHLORIDE 0.9 % IV SOLN
10.0000 mg | Freq: Once | INTRAVENOUS | Status: AC
  Administered 2020-03-30: 10 mg via INTRAVENOUS
  Filled 2020-03-30: qty 10

## 2020-03-30 MED ORDER — PALONOSETRON HCL INJECTION 0.25 MG/5ML
INTRAVENOUS | Status: AC
  Filled 2020-03-30: qty 5

## 2020-03-30 MED ORDER — SODIUM CHLORIDE 0.9 % IV SOLN
150.0000 mg | Freq: Once | INTRAVENOUS | Status: AC
  Administered 2020-03-30: 150 mg via INTRAVENOUS
  Filled 2020-03-30: qty 150

## 2020-03-30 MED ORDER — PALONOSETRON HCL INJECTION 0.25 MG/5ML
0.2500 mg | Freq: Once | INTRAVENOUS | Status: AC
  Administered 2020-03-30: 0.25 mg via INTRAVENOUS

## 2020-03-30 MED ORDER — SODIUM CHLORIDE 0.9 % IV SOLN
Freq: Once | INTRAVENOUS | Status: AC
  Filled 2020-03-30: qty 250

## 2020-03-30 MED ORDER — HEPARIN SOD (PORK) LOCK FLUSH 100 UNIT/ML IV SOLN
500.0000 [IU] | Freq: Once | INTRAVENOUS | Status: AC
  Administered 2020-03-30: 500 [IU] via INTRAVENOUS
  Filled 2020-03-30: qty 5

## 2020-03-30 MED ORDER — SODIUM CHLORIDE 0.9 % IV SOLN
30.0000 mg/m2 | Freq: Once | INTRAVENOUS | Status: AC
  Administered 2020-03-30: 56 mg via INTRAVENOUS
  Filled 2020-03-30: qty 50

## 2020-03-30 MED ORDER — SODIUM CHLORIDE 0.9% FLUSH
10.0000 mL | INTRAVENOUS | Status: DC | PRN
  Administered 2020-03-30: 10 mL via INTRAVENOUS
  Filled 2020-03-30: qty 10

## 2020-03-30 NOTE — Patient Instructions (Signed)
[facility] Discharge Instructions for Patients Receiving Chemotherapy  Today you received the following chemotherapy agents Cisplatin, Irinotecan  To help prevent nausea and vomiting after your treatment, we encourage you to take your nausea medication    If you develop nausea and vomiting that is not controlled by your nausea medication, call the clinic.   BELOW ARE SYMPTOMS THAT SHOULD BE REPORTED IMMEDIATELY:  *FEVER GREATER THAN 100.5 F  *CHILLS WITH OR WITHOUT FEVER  NAUSEA AND VOMITING THAT IS NOT CONTROLLED WITH YOUR NAUSEA MEDICATION  *UNUSUAL SHORTNESS OF BREATH  *UNUSUAL BRUISING OR BLEEDING  TENDERNESS IN MOUTH AND THROAT WITH OR WITHOUT PRESENCE OF ULCERS  *URINARY PROBLEMS  *BOWEL PROBLEMS  UNUSUAL RASH Items with * indicate a potential emergency and should be followed up as soon as possible.  Feel free to call the clinic should you have any questions or concerns. The clinic phone number is [phone].  Please show the [place] at check-in to the Emergency Department and triage nurse.

## 2020-03-30 NOTE — Patient Instructions (Signed)

## 2020-03-30 NOTE — Progress Notes (Signed)
Spoke with patient in treatment. He states he is doing well with no complaints.  Wife called and expressed concern over patient having increased symptoms of vertigo. She states he's been more dizzy, and having issues with his equilibrium. She wonders if he needs a referral to an ENT for further workup.   Spoke with Dr Marin Olp. At this time Dr Marin Olp believes we need to wait on his brain MRI which is scheduled for 11/4 as this might show the reason for his symptoms. I called centralized scheduling to see if MRI could be done any sooner, but there are no earlier appointments.  Oncology Nurse Navigator Documentation  Oncology Nurse Navigator Flowsheets 03/30/2020  Abnormal Finding Date -  Confirmed Diagnosis Date -  Diagnosis Status -  Planned Course of Treatment -  Phase of Treatment -  Chemotherapy Actual Start Date: -  Radiation Actual Start Date: -  Navigator Follow Up Date: 04/13/2020  Navigator Follow Up Reason: Follow-up Appointment;Chemotherapy  Production assistant, radio Encounter Type Telephone;Treatment  Telephone Incoming Call  Treatment Initiated Date -  Patient Visit Type MedOnc  Treatment Phase Active Tx  Barriers/Navigation Needs Coordination of Care;Education  Education -  Interventions Psycho-Social Support  Acuity Level 2-Minimal Needs (1-2 Barriers Identified)  Referrals -  Coordination of Care -  Education Method -  Support Groups/Services Friends and Family  Time Spent with Patient 56

## 2020-03-31 DIAGNOSIS — H6983 Other specified disorders of Eustachian tube, bilateral: Secondary | ICD-10-CM | POA: Diagnosis not present

## 2020-03-31 DIAGNOSIS — H6123 Impacted cerumen, bilateral: Secondary | ICD-10-CM | POA: Diagnosis not present

## 2020-04-01 ENCOUNTER — Other Ambulatory Visit: Payer: Medicare Other

## 2020-04-01 ENCOUNTER — Ambulatory Visit: Payer: Medicare Other

## 2020-04-01 ENCOUNTER — Ambulatory Visit: Payer: Medicare Other | Admitting: Hematology & Oncology

## 2020-04-07 ENCOUNTER — Other Ambulatory Visit: Payer: Self-pay | Admitting: *Deleted

## 2020-04-07 MED ORDER — DIPHENOXYLATE-ATROPINE 2.5-0.025 MG PO TABS: ORAL_TABLET | ORAL | 0 refills | Status: DC

## 2020-04-09 ENCOUNTER — Other Ambulatory Visit: Payer: Self-pay

## 2020-04-09 ENCOUNTER — Ambulatory Visit (HOSPITAL_COMMUNITY)
Admission: RE | Admit: 2020-04-09 | Discharge: 2020-04-09 | Disposition: A | Discharge status: Home / Self Care | Payer: Medicare Other | Source: Ambulatory Visit | Attending: Radiation Oncology | Admitting: Radiation Oncology

## 2020-04-09 DIAGNOSIS — M2548 Effusion, other site: Secondary | ICD-10-CM | POA: Diagnosis not present

## 2020-04-09 DIAGNOSIS — C7931 Secondary malignant neoplasm of brain: Secondary | ICD-10-CM | POA: Diagnosis not present

## 2020-04-09 DIAGNOSIS — C7949 Secondary malignant neoplasm of other parts of nervous system: Secondary | ICD-10-CM

## 2020-04-09 DIAGNOSIS — C3490 Malignant neoplasm of unspecified part of unspecified bronchus or lung: Secondary | ICD-10-CM | POA: Diagnosis not present

## 2020-04-09 DIAGNOSIS — G9389 Other specified disorders of brain: Secondary | ICD-10-CM | POA: Diagnosis not present

## 2020-04-09 IMAGING — MR MR HEAD WO/W CM
9 of 12 series · 24 of 48 positions shown · IV contrast (7.5 M gad)
Comparison: Brain MRI [DATE] and earlier.

CLINICAL DATA: 69-year-old male with extensive stage small cell
lung cancer status post whole brain radiation in [REDACTED] this year.
Six brain metastases at the time of radiation treatment. Restaging.

EXAM:
MRI HEAD WITHOUT AND WITH CONTRAST
TECHNIQUE: Multiplanar, multiecho pulse sequences of the brain and surrounding
structures were obtained without and with intravenous contrast.
CONTRAST:  7.5mL GADAVIST GADOBUTROL 1 MMOL/ML IV SOLN

[Series 2: FLAIR · sagittal · 3.0mm · 0.47mm/px · 2 of 40 slices shown (1 of 2)]
[im 1/40]
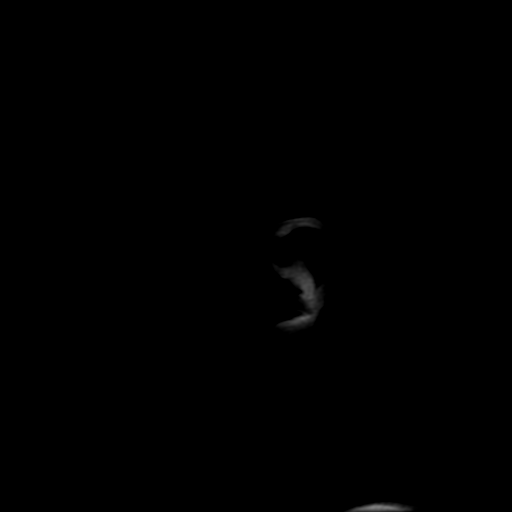
[im 40/40]
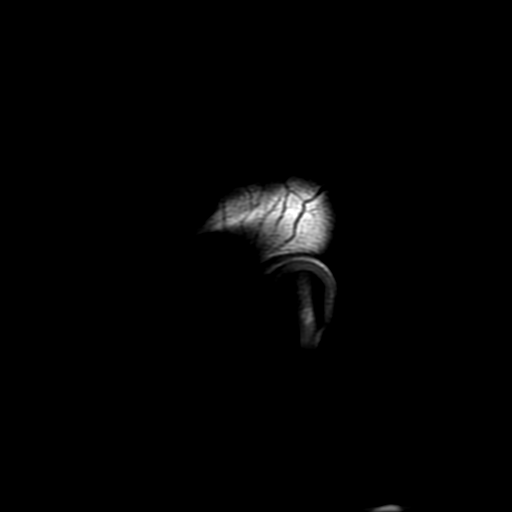

[Series 3: DWI · axial · 3.0mm · 0.94mm/px · z∈[-44,+136]mm · 5 of 122 slices shown]
[im 1/122]
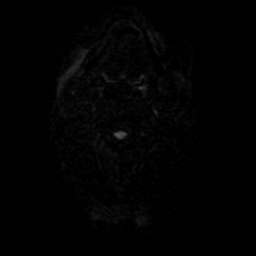
[im 31/122]
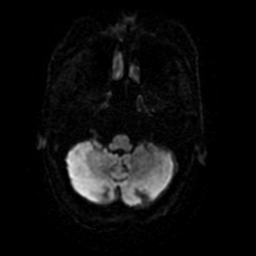
[im 61/122]
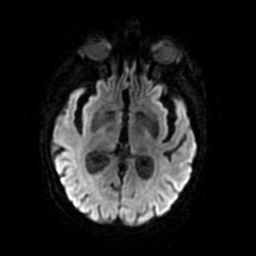
[im 91/122]
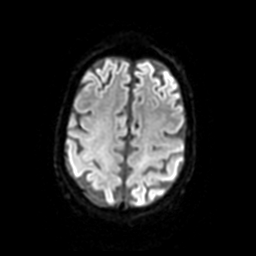
[im 122/122]
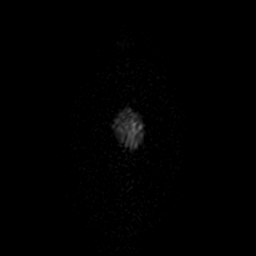

[Series 4: T2 · axial · 5.0mm · 0.47mm/px · 1 of 31 slices shown]
[im 1/31]
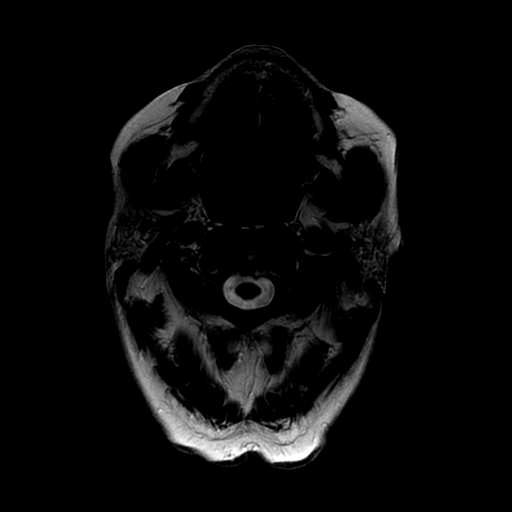

[Series 5: FLAIR · axial · 3.0mm · 0.47mm/px · z∈[-52,+140]mm · 3 of 65 slices shown (2 of 2)]
[im 1/65]
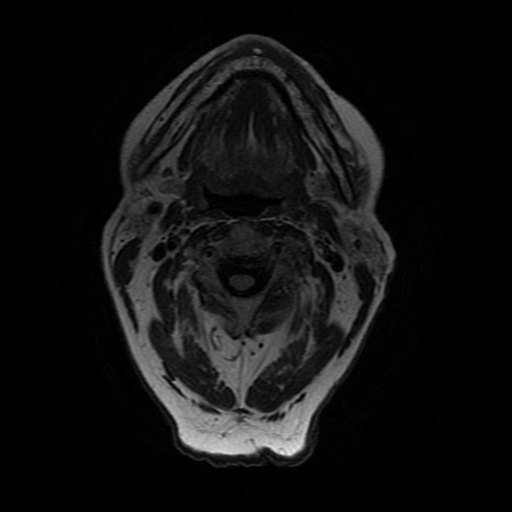
[im 33/65]
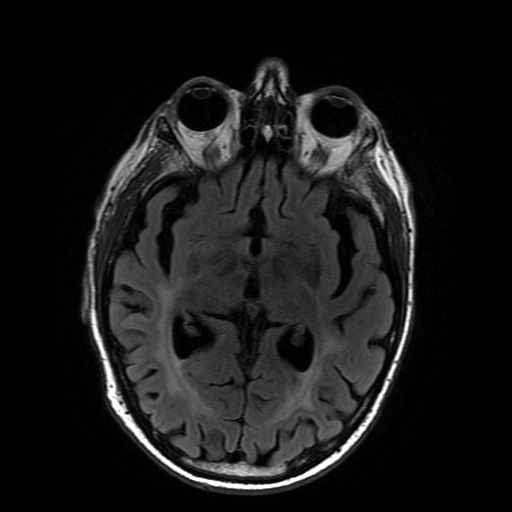
[im 65/65]
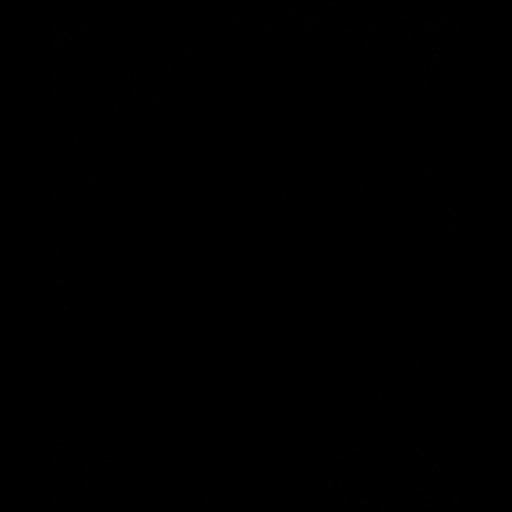

[Series 6: SWI · axial · 3.0mm · 0.47mm/px · z∈[-36,+7]mm · 2 of 120 slices shown]
[im 1/120]
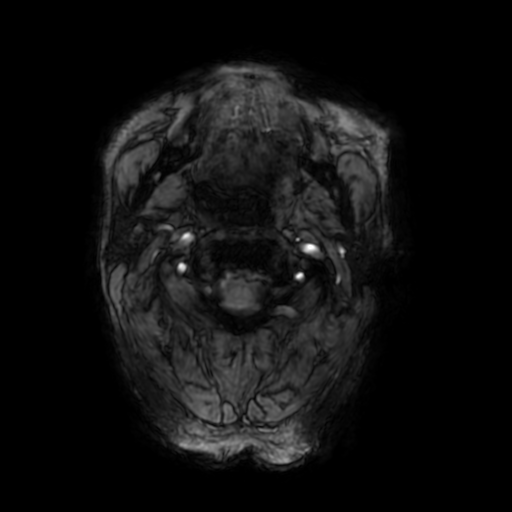
[im 30/120]
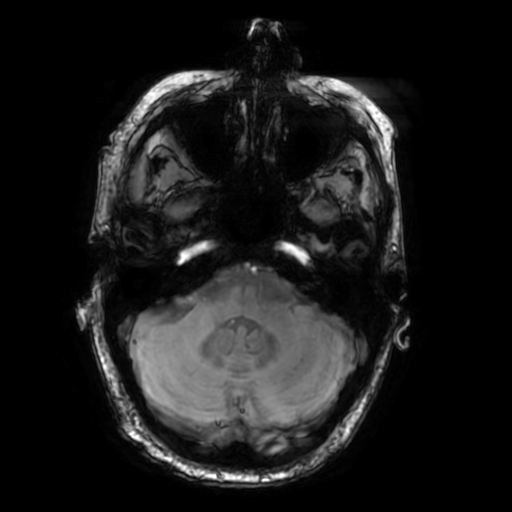

[Series 8: T2 post-contrast · coronal · 3.0mm · 0.39mm/px · 3 of 57 slices shown]
[im 1/57]
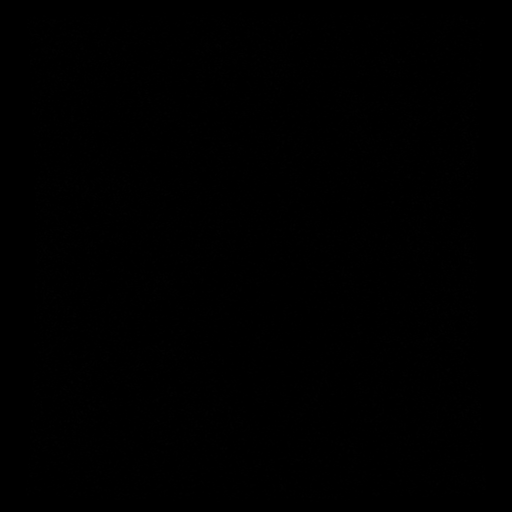
[im 29/57]
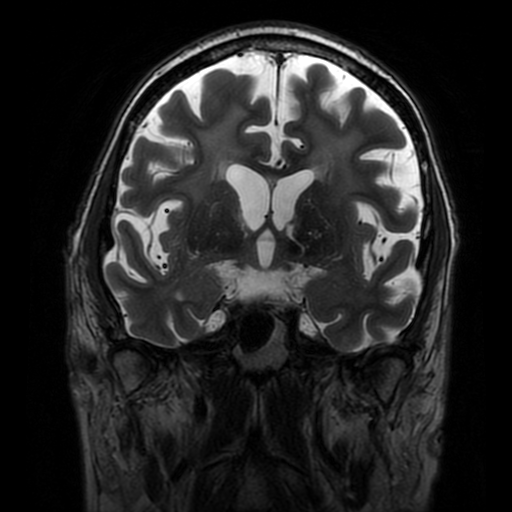
[im 57/57]
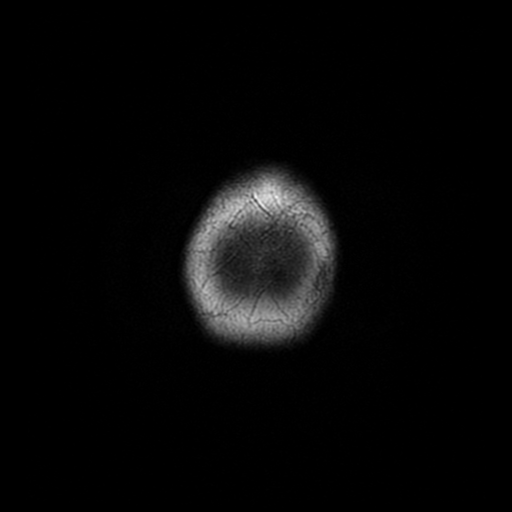

[Series 10: T1 post-contrast · coronal · 3.0mm · 0.39mm/px · 3 of 57 slices shown]
[im 1/57]
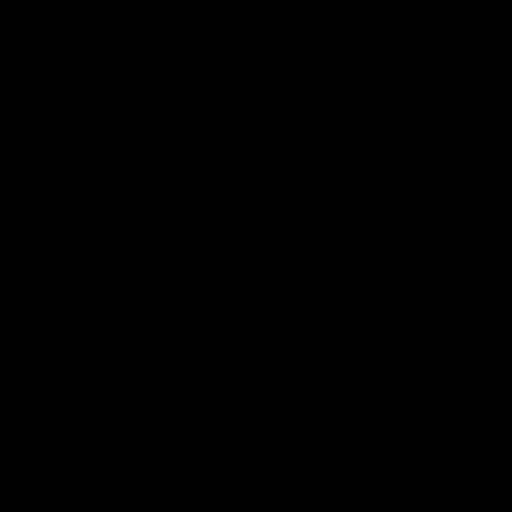
[im 29/57]
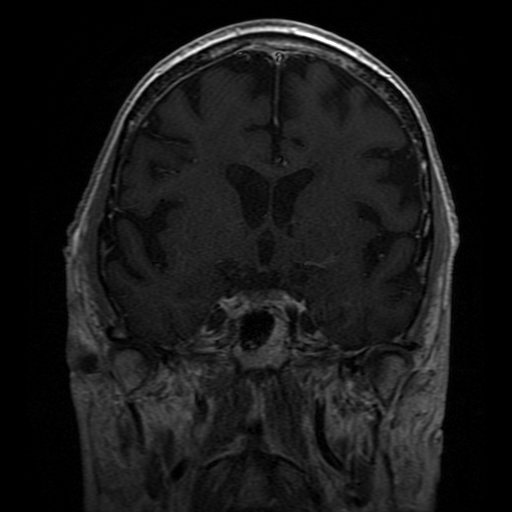
[im 57/57]
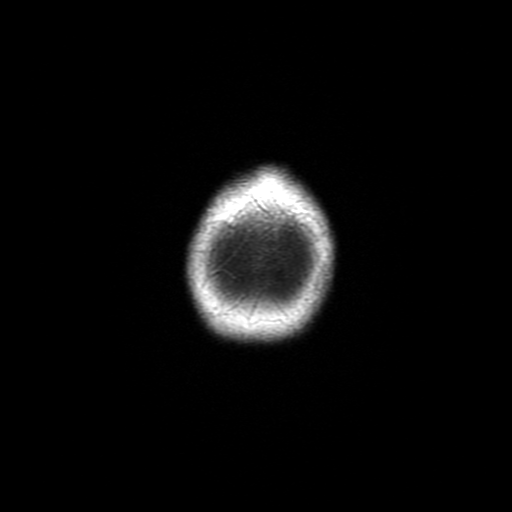

[Series 11: FLAIR post-contrast · sagittal · 3.0mm · 0.47mm/px · 2 of 40 slices shown]
[im 1/40]
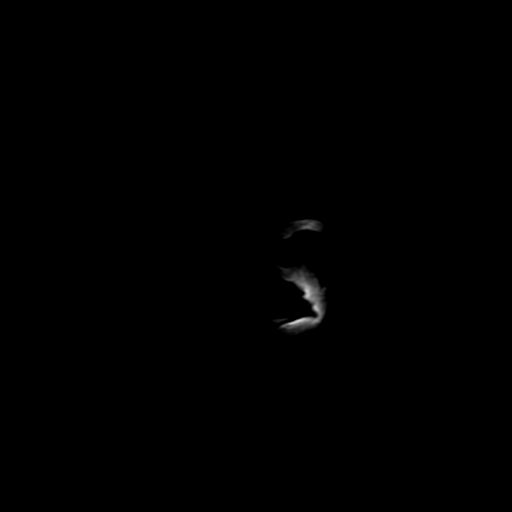
[im 40/40]
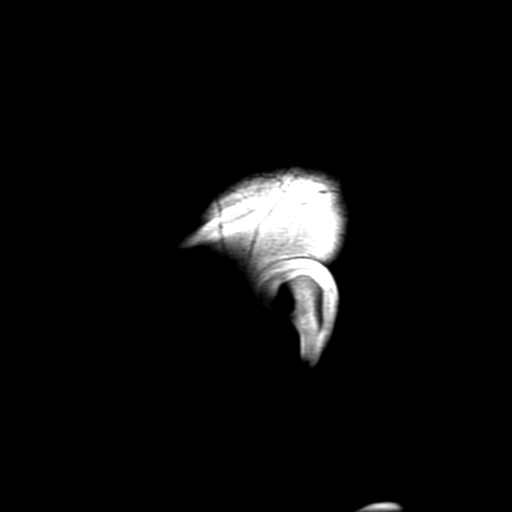

[Series 350: ADC · axial · 3.0mm · 0.94mm/px · z∈[-44,+136]mm · 3 of 61 slices shown]
[im 1/61]
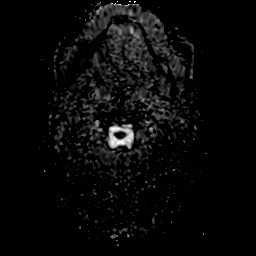
[im 31/61]
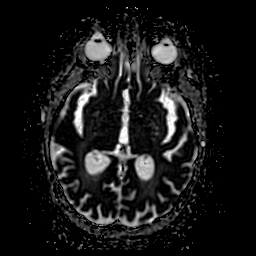
[im 61/61]
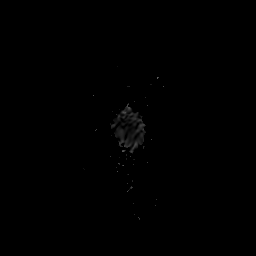

[24 of 48 positions shown; findings below may reference images not displayed]

FINDINGS: BRAIN

New Lesions: None.

Larger lesions: None.

Stable or Smaller lesions:

Punctate mm enhancing lesion located left superior frontal gyrus
seen on series 9, image 151.

Regression of patchy, stellate enhancement now encompassing about 6
mm located at the right perirolandic cortex seen on series 9, image
137. Stable mild associated hemosiderin.

Other Brain findings:

Stable cerebral volume. No restricted diffusion to suggest acute
infarction. No midline shift, mass effect, ventriculomegaly,
extra-axial collection or acute intracranial hemorrhage.
Cervicomedullary junction and pituitary are within normal limits.

Confluent post treatment cerebral white matter T2 and FLAIR
hyperintensity is stable. Occasional microhemorrhages are stable
(series 6, image 77). Stable small area of chronic T2 hyperintensity
in the right pons.

Vascular: Major intracranial vascular flow voids are stable. The
major dural venous sinuses are enhancing and appear to be patent.

Skull and upper cervical spine: Negative visible cervical spine and
spinal cord. Visualized bone marrow signal is within normal limits.

Sinuses/Orbits: Stable, negative.

Other: Stable mild mastoid effusions. Negative nasopharynx. Visible
internal auditory structures are within normal limits.
IMPRESSION: 1. Satisfactory post treatment appearance of the brain; two small
residual enhancing metastases are stable to slightly further
regressed since [DATE]. No new metastatic disease or acute intracranial abnormality
identified.

## 2020-04-09 MED ORDER — GADOBUTROL 1 MMOL/ML IV SOLN
7.5000 mL | Freq: Once | INTRAVENOUS | Status: AC | PRN
  Administered 2020-04-09: 7.5 mL via INTRAVENOUS

## 2020-04-13 ENCOUNTER — Other Ambulatory Visit: Payer: Self-pay

## 2020-04-13 ENCOUNTER — Inpatient Hospital Stay (HOSPITAL_BASED_OUTPATIENT_CLINIC_OR_DEPARTMENT_OTHER): Payer: Medicare Other | Admitting: Hematology & Oncology

## 2020-04-13 ENCOUNTER — Inpatient Hospital Stay: Payer: Medicare Other

## 2020-04-13 ENCOUNTER — Inpatient Hospital Stay: Payer: Medicare Other | Attending: Hematology & Oncology

## 2020-04-13 ENCOUNTER — Encounter: Payer: Self-pay | Admitting: Hematology & Oncology

## 2020-04-13 VITALS — BP 122/56 | HR 86 | Temp 98.6°F | Resp 17 | Wt 157.0 lb

## 2020-04-13 DIAGNOSIS — C3490 Malignant neoplasm of unspecified part of unspecified bronchus or lung: Secondary | ICD-10-CM | POA: Diagnosis not present

## 2020-04-13 DIAGNOSIS — Z5111 Encounter for antineoplastic chemotherapy: Secondary | ICD-10-CM | POA: Diagnosis not present

## 2020-04-13 DIAGNOSIS — Z79899 Other long term (current) drug therapy: Secondary | ICD-10-CM | POA: Insufficient documentation

## 2020-04-13 DIAGNOSIS — C3431 Malignant neoplasm of lower lobe, right bronchus or lung: Secondary | ICD-10-CM | POA: Insufficient documentation

## 2020-04-13 DIAGNOSIS — C4372 Malignant melanoma of left lower limb, including hip: Secondary | ICD-10-CM | POA: Diagnosis not present

## 2020-04-13 DIAGNOSIS — C787 Secondary malignant neoplasm of liver and intrahepatic bile duct: Secondary | ICD-10-CM | POA: Insufficient documentation

## 2020-04-13 DIAGNOSIS — R197 Diarrhea, unspecified: Secondary | ICD-10-CM | POA: Diagnosis not present

## 2020-04-13 DIAGNOSIS — R634 Abnormal weight loss: Secondary | ICD-10-CM | POA: Diagnosis not present

## 2020-04-13 DIAGNOSIS — C7931 Secondary malignant neoplasm of brain: Secondary | ICD-10-CM | POA: Insufficient documentation

## 2020-04-13 LAB — CBC WITH DIFFERENTIAL (CANCER CENTER ONLY)
Abs Immature Granulocytes: 0.03 10*3/uL (ref 0.00–0.07)
Basophils Absolute: 0 10*3/uL (ref 0.0–0.1)
Basophils Relative: 1 %
Eosinophils Absolute: 0.2 10*3/uL (ref 0.0–0.5)
Eosinophils Relative: 6 %
HCT: 25.9 % — ABNORMAL LOW (ref 39.0–52.0)
Hemoglobin: 8.9 g/dL — ABNORMAL LOW (ref 13.0–17.0)
Immature Granulocytes: 1 %
Lymphocytes Relative: 28 %
Lymphs Abs: 0.9 10*3/uL (ref 0.7–4.0)
MCH: 34.2 pg — ABNORMAL HIGH (ref 26.0–34.0)
MCHC: 34.4 g/dL (ref 30.0–36.0)
MCV: 99.6 fL (ref 80.0–100.0)
Monocytes Absolute: 0.4 10*3/uL (ref 0.1–1.0)
Monocytes Relative: 13 %
Neutro Abs: 1.7 10*3/uL (ref 1.7–7.7)
Neutrophils Relative %: 51 %
Platelet Count: 142 10*3/uL — ABNORMAL LOW (ref 150–400)
RBC: 2.6 MIL/uL — ABNORMAL LOW (ref 4.22–5.81)
RDW: 13.5 % (ref 11.5–15.5)
WBC Count: 3.3 10*3/uL — ABNORMAL LOW (ref 4.0–10.5)
nRBC: 0 % (ref 0.0–0.2)

## 2020-04-13 LAB — CMP (CANCER CENTER ONLY)
ALT: 17 U/L (ref 0–44)
AST: 14 U/L — ABNORMAL LOW (ref 15–41)
Albumin: 4.1 g/dL (ref 3.5–5.0)
Alkaline Phosphatase: 76 U/L (ref 38–126)
Anion gap: 9 (ref 5–15)
BUN: 14 mg/dL (ref 8–23)
CO2: 26 mmol/L (ref 22–32)
Calcium: 9.8 mg/dL (ref 8.9–10.3)
Chloride: 99 mmol/L (ref 98–111)
Creatinine: 1.21 mg/dL (ref 0.61–1.24)
GFR, Estimated: >60 mL/min (ref >60)
Glucose, Bld: 125 mg/dL — ABNORMAL HIGH (ref 70–99)
Potassium: 3.6 mmol/L (ref 3.5–5.1)
Sodium: 134 mmol/L — ABNORMAL LOW (ref 135–145)
Total Bilirubin: 0.4 mg/dL (ref 0.3–1.2)
Total Protein: 6.8 g/dL (ref 6.5–8.1)

## 2020-04-13 LAB — LACTATE DEHYDROGENASE: LDH: 113 U/L (ref 98–192)

## 2020-04-13 MED ORDER — SODIUM CHLORIDE 0.9 % IV SOLN
30.0000 mg/m2 | Freq: Once | INTRAVENOUS | Status: AC
  Administered 2020-04-13: 56 mg via INTRAVENOUS
  Filled 2020-04-13: qty 56

## 2020-04-13 MED ORDER — PALONOSETRON HCL INJECTION 0.25 MG/5ML
INTRAVENOUS | Status: AC
  Filled 2020-04-13: qty 5

## 2020-04-13 MED ORDER — PALONOSETRON HCL INJECTION 0.25 MG/5ML
0.2500 mg | Freq: Once | INTRAVENOUS | Status: AC
  Administered 2020-04-13: 0.25 mg via INTRAVENOUS

## 2020-04-13 MED ORDER — ATROPINE SULFATE 1 MG/ML IJ SOLN: 0.5000 mg | Freq: Once | INTRAMUSCULAR | Status: DC | PRN

## 2020-04-13 MED ORDER — HEPARIN SOD (PORK) LOCK FLUSH 100 UNIT/ML IV SOLN
500.0000 [IU] | Freq: Once | INTRAVENOUS | Status: AC | PRN
  Administered 2020-04-13: 500 [IU]
  Filled 2020-04-13: qty 5

## 2020-04-13 MED ORDER — SODIUM CHLORIDE 0.9 % IV SOLN
Freq: Once | INTRAVENOUS | Status: AC
  Filled 2020-04-13: qty 250

## 2020-04-13 MED ORDER — SODIUM CHLORIDE 0.9 % IV SOLN
Freq: Once | INTRAVENOUS | Status: AC
  Filled 2020-04-13: qty 10

## 2020-04-13 MED ORDER — SODIUM CHLORIDE 0.9 % IV SOLN
65.0000 mg/m2 | Freq: Once | INTRAVENOUS | Status: AC
  Administered 2020-04-13: 120 mg via INTRAVENOUS
  Filled 2020-04-13: qty 6

## 2020-04-13 MED ORDER — SODIUM CHLORIDE 0.9 % IV SOLN
150.0000 mg | Freq: Once | INTRAVENOUS | Status: AC
  Administered 2020-04-13: 150 mg via INTRAVENOUS
  Filled 2020-04-13: qty 150

## 2020-04-13 MED ORDER — SODIUM CHLORIDE 0.9% FLUSH
10.0000 mL | INTRAVENOUS | Status: DC | PRN
  Administered 2020-04-13: 10 mL
  Filled 2020-04-13: qty 10

## 2020-04-13 MED ORDER — SODIUM CHLORIDE 0.9 % IV SOLN
10.0000 mg | Freq: Once | INTRAVENOUS | Status: AC
  Administered 2020-04-13: 10 mg via INTRAVENOUS
  Filled 2020-04-13: qty 10

## 2020-04-13 NOTE — Progress Notes (Signed)
Hematology and Oncology Follow Up Visit  Walter Hernandez [id number] 1950-11-11 68 y.o. 04/13/2020   Principle Diagnosis:  Stage IB ([id number]) superficial spreading melanoma of the left lower leg  Small Cell Lung Cancer -- Extensive Stage -- lung/liver/brain mets  Past Therapy: Cranial XRT -- 06/19/2019 thru 07/02/2019  Current Therapy:        Carboplatin/VP-16/Tecentriq -- started on 07/07/2018, s/p cycle #4 Tecentriq - maintenance -- cycle#1 -- start on 10/01/2019 - d/c on 10/21/2019 Zepzelca/Keytruda -- q 3 wk --s/p cycle #6 - started on 10/28/2019 -- d/c on 03/15/2020 CDDP/Irinotecan -- s/p cycle #1 -- start on 03/23/2020   Interim History:  Walter Hernandez is here today for follow-up.  He does look quite good.  He has a great attitude.  He has had no problems with the chemotherapy although there was a little bit of diarrhea.  I told him to take the Lomotil a little more often.  His appetite is good.  His weight is down a little bit but he feels that this will come back up.  He has had no pain.  He has had no cough or shortness of breath.  He has had no headache.  He recently had an MRI of the brain done by radiation oncology.This was done on November 4.  This showed treatment of the brain mets.  He had 2 small residual metastasis that were further regressed.  There is no evidence of new metastatic disease.  Overall, his quality of life is doing pretty well right now.  He has had no leg swelling.  He has had no rashes.  He has had no mouth sores.  Overall, his performance status is ECOG 1.    Medications:  Allergies as of 04/13/2020   No Known Allergies     Medication List       Accurate as of April 13, 2020  8:22 AM. If you have any questions, ask your nurse or doctor.        acetaminophen 500 MG tablet Commonly known as: TYLENOL Take 1,000 mg by mouth every 6 (six) hours as needed for moderate pain.   ALPRAZolam 0.5 MG tablet Commonly known as: XANAX Take 1 tablet (0.5  mg total) by mouth every 6 (six) hours as needed for anxiety.   dexamethasone 4 MG tablet Commonly known as: DECADRON Take 2 tablets (8 mg total) by mouth daily. Take daily x 3 days starting the day after cisplatin chemotherapy. Take with food.   diphenoxylate-atropine 2.5-0.025 MG tablet Commonly known as: LOMOTIL Take 2 tablets at onset of diarrhea. Take one tablet after each loose stool. Max 8 tablets per day.   dronabinol 5 MG capsule Commonly known as: MARINOL Take 1 capsule (5 mg total) by mouth 2 (two) times daily before a meal.   finasteride 5 MG tablet Commonly known as: PROSCAR TAKE 1 TABLET BY MOUTH EVERY DAY   furosemide 20 MG tablet Commonly known as: LASIX Take 2 tablets (40 mg total) by mouth daily.   lidocaine-prilocaine cream Commonly known as: EMLA Apply to affected area once   loperamide 2 MG tablet Commonly known as: IMODIUM A-D Take 2 at diarrhea onset, then 1 every 2hr until 12hrs with no BM. May take 2 every 4hrs at night. If diarrhea recurs repeat.   LORazepam 0.5 MG tablet Commonly known as: Ativan Take 1 tablet (0.5 mg total) by mouth every 6 (six) hours as needed (Nausea or vomiting).   memantine 10 MG tablet Commonly known as: NAMENDA Take  10 mg by mouth daily.   multivitamin capsule Take 1 capsule by mouth daily.   nicotine 21 mg/24hr patch Commonly known as: NICODERM CQ - dosed in mg/24 hours PLACE 1 PATCH (21 MG TOTAL) ONTO THE SKIN DAILY.   omeprazole 40 MG capsule Commonly known as: PRILOSEC Take 1 capsule (40 mg total) by mouth 2 (two) times daily.   ondansetron 4 MG tablet Commonly known as: ZOFRAN Take 1 tablet (4 mg total) by mouth every 6 (six) hours as needed for nausea.   ondansetron 8 MG tablet Commonly known as: Zofran Take 1 tablet (8 mg total) by mouth 2 (two) times daily as needed. Start on the third day after cisplatin chemotherapy.   OPCON-A OP Place 2 drops into both eyes as needed (for dry eyes).   potassium  chloride SA 20 MEQ tablet Commonly known as: KLOR-CON Take 1 tablet (20 mEq total) by mouth daily.   prochlorperazine 10 MG tablet Commonly known as: COMPAZINE Take 1 tablet (10 mg total) by mouth every 6 (six) hours as needed (Nausea or vomiting).   tamsulosin 0.4 MG Caps capsule Commonly known as: FLOMAX Take 1 capsule (0.4 mg total) by mouth daily.       Allergies: No Known Allergies  Past Medical History, Surgical history, Social history, and Family History were reviewed and updated.  Review of Systems: Review of Systems  Constitutional: Positive for weight loss.  HENT: Negative.   Eyes: Negative.   Respiratory: Negative.   Cardiovascular: Negative.   Gastrointestinal: Negative.   Genitourinary: Negative.   Musculoskeletal: Negative.   Skin: Negative.   Neurological: Negative.   Endo/Heme/Allergies: Negative.   Psychiatric/Behavioral: Negative.      Physical Exam:  vitals were not taken for this visit.   Wt Readings from Last 3 Encounters:  03/23/20 164 lb (74.4 kg)  03/02/20 165 lb (74.8 kg)  02/11/20 168 lb (76.2 kg)    Physical Exam Vitals reviewed.  HENT:     Head: Normocephalic and atraumatic.  Eyes:     Pupils: Pupils are equal, round, and reactive to light.  Cardiovascular:     Rate and Rhythm: Normal rate and regular rhythm.     Heart sounds: Normal heart sounds.  Pulmonary:     Effort: Pulmonary effort is normal.     Breath sounds: Normal breath sounds.  Abdominal:     General: Bowel sounds are normal.     Palpations: Abdomen is soft.  Musculoskeletal:        General: No tenderness or deformity. Normal range of motion.     Cervical back: Normal range of motion.  Lymphadenopathy:     Cervical: No cervical adenopathy.  Skin:    General: Skin is warm and dry.     Findings: No erythema or rash.  Neurological:     Mental Status: He is alert and oriented to person, place, and time.  Psychiatric:        Behavior: Behavior normal.         Thought Content: Thought content normal.        Judgment: Judgment normal.      Lab Results  Component Value Date   WBC 3.3 (L) 04/13/2020   HGB 8.9 (L) 04/13/2020   HCT 25.9 (L) 04/13/2020   MCV 99.6 04/13/2020   PLT 142 (L) 04/13/2020   Lab Results  Component Value Date   FERRITIN 2,549 (H) 10/21/2019   IRON 43 10/21/2019   TIBC 225 10/21/2019   UIBC 182  10/21/2019   IRONPCTSAT 19 (L) 10/21/2019   Lab Results  Component Value Date   RETICCTPCT 1.5 08/07/2019   RBC 2.60 (L) 04/13/2020   No results found for: KPAFRELGTCHN, LAMBDASER, KAPLAMBRATIO No results found for: IGGSERUM, IGA, IGMSERUM No results found for: Kathrynn Ducking, MSPIKE, SPEI   Chemistry      Component Value Date/Time   NA 134 (L) 03/30/2020 0820   NA 142 01/30/2017 0804   K 3.7 03/30/2020 0820   K 4.1 01/30/2017 0804   CL 99 03/30/2020 0820   CL 105 01/25/2016 0926   CO2 26 03/30/2020 0820   CO2 29 01/30/2017 0804   BUN 16 03/30/2020 0820   BUN 6.9 (L) 01/30/2017 0804   CREATININE 1.01 03/30/2020 0820   CREATININE 0.8 01/30/2017 0804      Component Value Date/Time   CALCIUM 10.1 03/30/2020 0820   CALCIUM 10.5 (H) 01/30/2017 0804   ALKPHOS 68 03/30/2020 0820   ALKPHOS 79 01/30/2017 0804   AST 16 03/30/2020 0820   AST 34 01/30/2017 0804   ALT 17 03/30/2020 0820   ALT 18 01/30/2017 0804   BILITOT 0.5 03/30/2020 0820   BILITOT 1.49 (H) 01/30/2017 0804       Impression and Plan: Walter Hernandez is a very pleasant 69 yo caucasian gentleman with history of stage 1b melanoma of the left lower leg.   This really is not a problem right now.  I would like to hope that he is responding to treatment.  This will be the start of the second cycle of treatment.  After this cycle, we will evaluate him with a PET scan.  I am glad that the MRI of the brain looked okay.  This is also a very encouraging sign.  We will plan for his PET scan to be done probably right  around Thanksgiving.  We will plan to get him back to see [place] after Thanksgiving.  We clearly are focusing on his quality of life.  Volanda Napoleon, MD 11/8/20218:22 AM

## 2020-04-13 NOTE — Progress Notes (Signed)
Ok to run post CDDP fluids with CDDP  Per Dr Marin Olp.

## 2020-04-13 NOTE — Patient Instructions (Addendum)
[facility] Discharge Instructions for Patients Receiving Chemotherapy  Today you received the following chemotherapy agents Irinotecan, Cisplatin  To help prevent nausea and vomiting after your treatment, we encourage you to take your nausea medication as prescribed by MD.   If you develop nausea and vomiting that is not controlled by your nausea medication, call the clinic.   BELOW ARE SYMPTOMS THAT SHOULD BE REPORTED IMMEDIATELY:  *FEVER GREATER THAN 100.5 F  *CHILLS WITH OR WITHOUT FEVER  NAUSEA AND VOMITING THAT IS NOT CONTROLLED WITH YOUR NAUSEA MEDICATION  *UNUSUAL SHORTNESS OF BREATH  *UNUSUAL BRUISING OR BLEEDING  TENDERNESS IN MOUTH AND THROAT WITH OR WITHOUT PRESENCE OF ULCERS  *URINARY PROBLEMS  *BOWEL PROBLEMS  UNUSUAL RASH Items with * indicate a potential emergency and should be followed up as soon as possible.  Feel free to call the clinic should you have any questions or concerns. The clinic phone number is [phone].  Please show the [facility] at check-in to the Emergency Department and triage nurse.  Pt discharged in no apparent distress. Pt left ambulatory without assistance. Pt aware of discharge instructions and verbalized understanding and had no further questions.

## 2020-04-13 NOTE — Patient Instructions (Signed)

## 2020-04-13 NOTE — Progress Notes (Signed)
Pt voided x1 UO this am in lobby, UO x50cc at 1045. Reviewed with MD, ok to proceed with treatment with current pt output.

## 2020-04-14 ENCOUNTER — Encounter: Payer: Self-pay | Admitting: *Deleted

## 2020-04-14 NOTE — Progress Notes (Signed)
Patient saw Dr Marin Olp yesterday. He needs a PET scan prior to his next cycle. Scheduled for 04/29/2020.  Called and spoke to [place]. Reviewed appointment date, time and location. Reviewed NPO status and no carbs. She knows he needs to arrive at 8:30a.  Oncology Nurse Navigator Documentation  Oncology Nurse Navigator Flowsheets 04/14/2020  Abnormal Finding Date -  Confirmed Diagnosis Date -  Diagnosis Status -  Planned Course of Treatment -  Phase of Treatment -  Chemotherapy Actual Start Date: -  Radiation Actual Start Date: -  Navigator Follow Up Date: 04/29/2020  Navigator Follow Up Reason: Scan Review  Navigator Location [facility]  Navigator Encounter Type Telephone;Appt/Treatment Plan Review  Telephone Outgoing Call;Appt Confirmation/Clarification  Treatment Initiated Date -  Patient Visit Type MedOnc  Treatment Phase Active Tx  Barriers/Navigation Needs Coordination of Care;Education  Education Other  Interventions Coordination of Care;Education;Psycho-Social Support  Acuity Level 2-Minimal Needs (1-2 Barriers Identified)  Referrals -  Coordination of Care Appts;Radiology  Education Method Verbal;Teach-back  Support Groups/Services Friends and Family  Time Spent with Patient 85

## 2020-04-15 ENCOUNTER — Telehealth: Payer: Self-pay | Admitting: Urology

## 2020-04-20 ENCOUNTER — Inpatient Hospital Stay: Payer: Medicare Other

## 2020-04-20 ENCOUNTER — Other Ambulatory Visit: Payer: Medicare Other

## 2020-04-20 ENCOUNTER — Ambulatory Visit: Payer: Medicare Other | Admitting: Hematology & Oncology

## 2020-04-20 ENCOUNTER — Other Ambulatory Visit: Payer: Self-pay

## 2020-04-20 ENCOUNTER — Ambulatory Visit: Payer: Medicare Other

## 2020-04-20 VITALS — BP 134/74 | HR 83 | Temp 98.2°F | Resp 17

## 2020-04-20 DIAGNOSIS — C787 Secondary malignant neoplasm of liver and intrahepatic bile duct: Secondary | ICD-10-CM | POA: Diagnosis not present

## 2020-04-20 DIAGNOSIS — C7931 Secondary malignant neoplasm of brain: Secondary | ICD-10-CM | POA: Diagnosis not present

## 2020-04-20 DIAGNOSIS — C3490 Malignant neoplasm of unspecified part of unspecified bronchus or lung: Secondary | ICD-10-CM

## 2020-04-20 DIAGNOSIS — C3431 Malignant neoplasm of lower lobe, right bronchus or lung: Secondary | ICD-10-CM | POA: Diagnosis not present

## 2020-04-20 DIAGNOSIS — C4372 Malignant melanoma of left lower limb, including hip: Secondary | ICD-10-CM | POA: Diagnosis not present

## 2020-04-20 DIAGNOSIS — Z5111 Encounter for antineoplastic chemotherapy: Secondary | ICD-10-CM | POA: Diagnosis not present

## 2020-04-20 DIAGNOSIS — R634 Abnormal weight loss: Secondary | ICD-10-CM | POA: Diagnosis not present

## 2020-04-20 LAB — CMP (CANCER CENTER ONLY)
ALT: 11 U/L (ref 0–44)
AST: 12 U/L — ABNORMAL LOW (ref 15–41)
Albumin: 4.1 g/dL (ref 3.5–5.0)
Alkaline Phosphatase: 72 U/L (ref 38–126)
Anion gap: 8 (ref 5–15)
BUN: 13 mg/dL (ref 8–23)
CO2: 28 mmol/L (ref 22–32)
Calcium: 10 mg/dL (ref 8.9–10.3)
Chloride: 99 mmol/L (ref 98–111)
Creatinine: 1.05 mg/dL (ref 0.61–1.24)
GFR, Estimated: >60 mL/min (ref >60)
Glucose, Bld: 117 mg/dL — ABNORMAL HIGH (ref 70–99)
Potassium: 3.8 mmol/L (ref 3.5–5.1)
Sodium: 135 mmol/L (ref 135–145)
Total Bilirubin: 0.3 mg/dL (ref 0.3–1.2)
Total Protein: 6.6 g/dL (ref 6.5–8.1)

## 2020-04-20 LAB — CBC WITH DIFFERENTIAL (CANCER CENTER ONLY)
Abs Immature Granulocytes: 0.05 10*3/uL (ref 0.00–0.07)
Basophils Absolute: 0 10*3/uL (ref 0.0–0.1)
Basophils Relative: 1 %
Eosinophils Absolute: 0 10*3/uL (ref 0.0–0.5)
Eosinophils Relative: 1 %
HCT: 26.1 % — ABNORMAL LOW (ref 39.0–52.0)
Hemoglobin: 9.1 g/dL — ABNORMAL LOW (ref 13.0–17.0)
Immature Granulocytes: 2 %
Lymphocytes Relative: 32 %
Lymphs Abs: 1 10*3/uL (ref 0.7–4.0)
MCH: 34.9 pg — ABNORMAL HIGH (ref 26.0–34.0)
MCHC: 34.9 g/dL (ref 30.0–36.0)
MCV: 100 fL (ref 80.0–100.0)
Monocytes Absolute: 0.3 10*3/uL (ref 0.1–1.0)
Monocytes Relative: 11 %
Neutro Abs: 1.6 10*3/uL — ABNORMAL LOW (ref 1.7–7.7)
Neutrophils Relative %: 53 %
Platelet Count: 124 10*3/uL — ABNORMAL LOW (ref 150–400)
RBC: 2.61 MIL/uL — ABNORMAL LOW (ref 4.22–5.81)
RDW: 13.7 % (ref 11.5–15.5)
WBC Count: 3 10*3/uL — ABNORMAL LOW (ref 4.0–10.5)
nRBC: 0 % (ref 0.0–0.2)

## 2020-04-20 LAB — LACTATE DEHYDROGENASE: LDH: 122 U/L (ref 98–192)

## 2020-04-20 MED ORDER — SODIUM CHLORIDE 0.9 % IV SOLN
30.0000 mg/m2 | Freq: Once | INTRAVENOUS | Status: AC
  Administered 2020-04-20: 56 mg via INTRAVENOUS
  Filled 2020-04-20: qty 56

## 2020-04-20 MED ORDER — PALONOSETRON HCL INJECTION 0.25 MG/5ML
INTRAVENOUS | Status: AC
  Filled 2020-04-20: qty 5

## 2020-04-20 MED ORDER — SODIUM CHLORIDE 0.9 % IV SOLN
Freq: Once | INTRAVENOUS | Status: AC
  Filled 2020-04-20: qty 10

## 2020-04-20 MED ORDER — PALONOSETRON HCL INJECTION 0.25 MG/5ML
0.2500 mg | Freq: Once | INTRAVENOUS | Status: AC
  Administered 2020-04-20: 0.25 mg via INTRAVENOUS

## 2020-04-20 MED ORDER — SODIUM CHLORIDE 0.9 % IV SOLN
Freq: Once | INTRAVENOUS | Status: AC
  Filled 2020-04-20: qty 250

## 2020-04-20 MED ORDER — ATROPINE SULFATE 1 MG/ML IJ SOLN: 0.5000 mg | Freq: Once | INTRAMUSCULAR | Status: DC | PRN

## 2020-04-20 MED ORDER — SODIUM CHLORIDE 0.9 % IV SOLN
65.0000 mg/m2 | Freq: Once | INTRAVENOUS | Status: AC
  Administered 2020-04-20: 120 mg via INTRAVENOUS
  Filled 2020-04-20: qty 6

## 2020-04-20 MED ORDER — SODIUM CHLORIDE 0.9 % IV SOLN
10.0000 mg | Freq: Once | INTRAVENOUS | Status: AC
  Administered 2020-04-20: 10 mg via INTRAVENOUS
  Filled 2020-04-20: qty 10

## 2020-04-20 MED ORDER — ATROPINE SULFATE 1 MG/ML IJ SOLN
INTRAMUSCULAR | Status: AC
  Filled 2020-04-20: qty 1

## 2020-04-20 MED ORDER — SODIUM CHLORIDE 0.9 % IV SOLN
150.0000 mg | Freq: Once | INTRAVENOUS | Status: AC
  Administered 2020-04-20: 150 mg via INTRAVENOUS
  Filled 2020-04-20: qty 150

## 2020-04-20 MED ORDER — SODIUM CHLORIDE 0.9% FLUSH
10.0000 mL | INTRAVENOUS | Status: DC | PRN
  Administered 2020-04-20: 10 mL
  Filled 2020-04-20: qty 10

## 2020-04-20 MED ORDER — HEPARIN SOD (PORK) LOCK FLUSH 100 UNIT/ML IV SOLN
500.0000 [IU] | Freq: Once | INTRAVENOUS | Status: AC | PRN
  Administered 2020-04-20: 500 [IU]
  Filled 2020-04-20: qty 5

## 2020-04-20 NOTE — Patient Instructions (Signed)
[facility] Discharge Instructions for Patients Receiving Chemotherapy  Today you received the following chemotherapy agents Irinotecan, Cisplatin  To help prevent nausea and vomiting after your treatment, we encourage you to take your nausea medication as prescribed by MD.   If you develop nausea and vomiting that is not controlled by your nausea medication, call the clinic.   BELOW ARE SYMPTOMS THAT SHOULD BE REPORTED IMMEDIATELY:  *FEVER GREATER THAN 100.5 F  *CHILLS WITH OR WITHOUT FEVER  NAUSEA AND VOMITING THAT IS NOT CONTROLLED WITH YOUR NAUSEA MEDICATION  *UNUSUAL SHORTNESS OF BREATH  *UNUSUAL BRUISING OR BLEEDING  TENDERNESS IN MOUTH AND THROAT WITH OR WITHOUT PRESENCE OF ULCERS  *URINARY PROBLEMS  *BOWEL PROBLEMS  UNUSUAL RASH Items with * indicate a potential emergency and should be followed up as soon as possible.  Feel free to call the clinic should you have any questions or concerns. The clinic phone number is [phone].  Please show the [facility] at check-in to the Emergency Department and triage nurse.  Pt discharged in no apparent distress. Pt left ambulatory without assistance. Pt aware of discharge instructions and verbalized understanding and had no further questions.

## 2020-04-20 NOTE — Patient Instructions (Signed)

## 2020-04-20 NOTE — Progress Notes (Signed)
Pt discharged in no apparent distress. Pt left ambulatory without assistance. Pt aware of discharge instructions and verbalized understanding and had no further questions.  

## 2020-04-22 ENCOUNTER — Ambulatory Visit
Admission: RE | Admit: 2020-04-22 | Discharge: 2020-04-22 | Disposition: A | Discharge status: Home / Self Care | Payer: Medicare Other | Source: Ambulatory Visit | Attending: Urology | Admitting: Urology

## 2020-04-22 ENCOUNTER — Encounter: Payer: Self-pay | Admitting: Urology

## 2020-04-22 ENCOUNTER — Other Ambulatory Visit: Payer: Self-pay

## 2020-04-22 DIAGNOSIS — Z08 Encounter for follow-up examination after completed treatment for malignant neoplasm: Secondary | ICD-10-CM | POA: Diagnosis not present

## 2020-04-22 DIAGNOSIS — Z85118 Personal history of other malignant neoplasm of bronchus and lung: Secondary | ICD-10-CM | POA: Diagnosis not present

## 2020-04-22 DIAGNOSIS — C7931 Secondary malignant neoplasm of brain: Secondary | ICD-10-CM | POA: Diagnosis not present

## 2020-04-22 NOTE — Progress Notes (Signed)
Radiation Oncology         [phone] ________________________________  Name: Walter Hernandez MRN: [id number]  Date: 04/22/2020  DOB: [birth year]  Post Treatment Note  CC: Lavone Orn, MD  Lavone Orn, MD  Diagnosis:   69 yo man with brain metastases from extensive stage small cell lung cancer.  Interval Since Last Radiation:  10 months  1/13-1/26/21:   The whole brain was treated to 30 Gy in 10 fractions of 3 Gy  Narrative:  I spoke with the patient and his wife to conduct his routine scheduled 3 month follow up visit to review results from his recent brain MRI via telephone to spare the patient unnecessary potential exposure in the healthcare setting during the current COVID-19 pandemic.  The patient was notified in advance and gave permission to proceed with this visit format. The patient has recovered well from his previous WBRT and is currently without complaints aside from some dysphagia that he has been dealing with on and off for several years associated with his known hiatal hernia.   He denies recent headaches, N/V, dizziness, imbalance, focal weakness, changes in auditory or visual acuity, tremors or seizure activity. He completed 4 cycles of systemic chemotherapy with Carboplatin/VP-16/Tecentriq and transitioned to maintenance immunotherapy with single agent Tecentriq on 10/01/19.  Unfortunately, a repeat PET scan on 10/17/19 showed progressive disease with worsening liver metastases and increased LAN in the mediastinum, hilum and upper abdomen. Therefore, the Tecentriq was discontinued and he was switched to Zelpelca/Keytruda with his first cycle on 10/28/19. A restaging PET scan on 12/23/19, after completing 3 cycles of his new systemic therapy showed a near complete metabolic response to treatment with only mild residual hypermetabolism within a single portacaval nodal metastasis, which had decreased in size and significantly decreased in metabolism. Otherwise resolved right lower neck,  mediastinal, right hilar and upper abdominal lymphadenopathy and resolved liver metastases. However, his most recent PET scan for disease restaging, performed on 03/16/20 showed significant disease progression with approximately 12 new metastatic lesions in the liver; new and increased porta hepatis and portacaval adenopathy; and new and increased thoracic adenopathy in addition to a right level V lymph node which is small but hypermetabolic. His systemic therapy was again changed, discontinuing the Zelpelca/Keytruda and starting CDDP/Irinotecan cycle #1 on 03/23/2020. He is tolerating the new systemic therapy well aside from some diarrhea which is manageable. He is scheduled for repeat PET imaging on 04/29/20 and will follow up with Dr. Marin Olp on 05/04/20.  His recent post-treatment brain MRI from 04/09/20 continues to show an excellent response to his radiation treatment with stable to decreased size of a treated right frontal lesion, stable punctate left frontal lesion and no new mass or abnormal enhancement. I reviewed these results today with him and his wife.  On review of systems, the patient states that he is doing very well in general.  He has had significant improvement in the fatigue over the past several months and is no longer having any N/V.  His appetite has improved as well and though he has lost a few pounds recently, likely associated with diarrhea with the new systemic therapy, he is encouraged that this will come back up as he adjusts to the new treatment. He has also had significant improvement in his swallowing with solids since having a recent esophageal dilatation. He is drinking 2-3 protein shakes per day to supplement his nutrition in addition to more regular meals. He still has some decreased taste/change in his taste buds  but is adapting. Otherwise, he denies headaches, dizziness, paraesthesias, focal weakness, tremors or seizure activity.  The left sided weakness remains completely  resolved which he is delighted with. He feels like he is gradually regaining his strength. Overall, he is quite pleased with his progress to date.  ALLERGIES:  has No Known Allergies.  Meds: Current Outpatient Medications  Medication Sig Dispense Refill  . acetaminophen (TYLENOL) 500 MG tablet Take 1,000 mg by mouth every 6 (six) hours as needed for moderate pain.    Marland Kitchen ALPRAZolam (XANAX) 0.5 MG tablet Take 1 tablet (0.5 mg total) by mouth every 6 (six) hours as needed for anxiety. 60 tablet 0  . dexamethasone (DECADRON) 4 MG tablet Take 2 tablets (8 mg total) by mouth daily. Take daily x 3 days starting the day after cisplatin chemotherapy. Take with food. 30 tablet 1  . diphenoxylate-atropine (LOMOTIL) 2.5-0.025 MG tablet Take 2 tablets at onset of diarrhea. Take one tablet after each loose stool. Max 8 tablets per day. 30 tablet 0  . dronabinol (MARINOL) 5 MG capsule Take 1 capsule (5 mg total) by mouth 2 (two) times daily before a meal. (Patient not taking: Reported on 10/03/2019) 60 capsule 2  . finasteride (PROSCAR) 5 MG tablet TAKE 1 TABLET BY MOUTH EVERY DAY 90 tablet 1  . furosemide (LASIX) 20 MG tablet Take 2 tablets (40 mg total) by mouth daily. (Patient not taking: Reported on 10/03/2019) 30 tablet 5  . lidocaine-prilocaine (EMLA) cream Apply to affected area once 30 g 3  . loperamide (IMODIUM A-D) 2 MG tablet Take 2 at diarrhea onset, then 1 every 2hr until 12hrs with no BM. May take 2 every 4hrs at night. If diarrhea recurs repeat. 100 tablet 1  . LORazepam (ATIVAN) 0.5 MG tablet Take 1 tablet (0.5 mg total) by mouth every 6 (six) hours as needed (Nausea or vomiting). 30 tablet 0  . memantine (NAMENDA) 10 MG tablet Take 10 mg by mouth daily.    . Multiple Vitamin (MULTIVITAMIN) capsule Take 1 capsule by mouth daily.    . Naphazoline-Pheniramine (OPCON-A OP) Place 2 drops into both eyes as needed (for dry eyes).    . nicotine (NICODERM CQ - DOSED IN MG/24 HOURS) 21 mg/24hr patch PLACE 1  PATCH (21 MG TOTAL) ONTO THE SKIN DAILY. 28 patch 2  . omeprazole (PRILOSEC) 40 MG capsule Take 1 capsule (40 mg total) by mouth 2 (two) times daily. 180 capsule 3  . ondansetron (ZOFRAN) 4 MG tablet Take 1 tablet (4 mg total) by mouth every 6 (six) hours as needed for nausea. (Patient not taking: Reported on 10/03/2019) 20 tablet 0  . ondansetron (ZOFRAN) 8 MG tablet Take 1 tablet (8 mg total) by mouth 2 (two) times daily as needed. Start on the third day after cisplatin chemotherapy. 30 tablet 1  . potassium chloride SA (KLOR-CON) 20 MEQ tablet Take 1 tablet (20 mEq total) by mouth daily. 30 tablet 4  . prochlorperazine (COMPAZINE) 10 MG tablet Take 1 tablet (10 mg total) by mouth every 6 (six) hours as needed (Nausea or vomiting). 30 tablet 1  . tamsulosin (FLOMAX) 0.4 MG CAPS capsule Take 1 capsule (0.4 mg total) by mouth daily. 60 capsule 1   No current facility-administered medications for this visit.   Facility-Administered Medications Ordered in Other Visits  Medication Dose Route Frequency Provider Last Rate Last Admin  . sodium chloride flush (NS) 0.9 % injection 10 mL  10 mL Intravenous PRN Marin Olp, Rudell Cobb, MD  10 mL at 10/21/19 0912  . sodium chloride flush (NS) 0.9 % injection 10 mL  10 mL Intravenous PRN Volanda Napoleon, MD   10 mL at 03/30/20 1558    Physical Findings:  vitals were not taken for this visit.   Karen Kays to assess due to telephone follow-up visit format.  Lab Findings: Lab Results  Component Value Date   WBC 3.0 (L) 04/20/2020   HGB 9.1 (L) 04/20/2020   HCT 26.1 (L) 04/20/2020   MCV 100.0 04/20/2020   PLT 124 (L) 04/20/2020     Radiographic Findings: MR Brain W Wo Contrast  Result Date: 04/10/2020 CLINICAL DATA:  69 year old male with extensive stage small cell lung cancer status post whole brain radiation in January this year. Six brain metastases at the time of radiation treatment. Restaging. EXAM: MRI HEAD WITHOUT AND WITH CONTRAST TECHNIQUE:  Multiplanar, multiecho pulse sequences of the brain and surrounding structures were obtained without and with intravenous contrast. CONTRAST:  7.22mL GADAVIST GADOBUTROL 1 MMOL/ML IV SOLN COMPARISON:  Brain MRI 01/09/2020 and earlier. FINDINGS: BRAIN New Lesions: None. Larger lesions: None. Stable or Smaller lesions: Punctate mm enhancing lesion located left superior frontal gyrus seen on series 9, image 151. Regression of patchy, stellate enhancement now encompassing about 6 mm located at the right perirolandic cortex seen on series 9, image 137. Stable mild associated hemosiderin. Other Brain findings: Stable cerebral volume. No restricted diffusion to suggest acute infarction. No midline shift, mass effect, ventriculomegaly, extra-axial collection or acute intracranial hemorrhage. Cervicomedullary junction and pituitary are within normal limits. Confluent post treatment cerebral white matter T2 and FLAIR hyperintensity is stable. Occasional microhemorrhages are stable (series 6, image 77). Stable small area of chronic T2 hyperintensity in the right pons. Vascular: Major intracranial vascular flow voids are stable. The major dural venous sinuses are enhancing and appear to be patent. Skull and upper cervical spine: Negative visible cervical spine and spinal cord. Visualized bone marrow signal is within normal limits. Sinuses/Orbits: Stable, negative. Other: Stable mild mastoid effusions. Negative nasopharynx. Visible internal auditory structures are within normal limits. IMPRESSION: 1. Satisfactory post treatment appearance of the brain; two small residual enhancing metastases are stable to slightly further regressed since August. 2. No new metastatic disease or acute intracranial abnormality identified. Electronically Signed   By: Genevie Ann M.D.   On: 04/10/2020 09:34    Impression/Plan: 1. 69 yo male with brain metastases from extensive stage small cell lung cancer. He appears to have recovered well from the  effects of his palliative whole brain radiation and currently remains without complaints.  He completed the 6 months of prophylactic Namenda as prescribed and tolerated this well.  His recent brain MRI continues to show an excellent response to treatment so we discussed the plan to continue to monitor the brain closely with serial MRI scans of the brain every 3 months for the first 1-2 years and then every 4-6 months thereafter. He does require pre-medication with Ativan to tolerate the MRI scans so I will make sure this is available to him for use prior to each scan. We will follow up by telephone following each scan to review results and recommendation from the multi-disciplinary brain conference.  He will also continue in routine follow-up under the care and direction of Dr. Marin Olp for continued management of his systemic disease.  He and his wife appear to have a good understanding of these recommendations and are comfortable and in agreement with the stated plan.  They know to  call at anytime with any questions or concerns related to his previous radiation.  Given current concerns for patient exposure during the COVID-19 pandemic, this encounter was conducted via telephone. The patient was notified in advance and was offered a [place] meeting to allow for face to face communication but unfortunately reported that he did not have the appropriate resources/technology to support such a visit and instead preferred to proceed with telephone consult. The patient has given verbal consent for this type of encounter. The time spent during this encounter was 25 minutes. The attendants for this meeting include Navraj Dreibelbis PA-C, patient, Christorpher Hisaw and his wife, Kennyth Lose. During the encounter, Maisyn Nouri PA-C, was located at [facility] Radiation Oncology Department.  Patient, Stefen Juba and his wife, Kennyth Lose  were located at home.    Nicholos Johns, PA-C

## 2020-04-27 ENCOUNTER — Other Ambulatory Visit: Payer: Self-pay | Admitting: Hematology & Oncology

## 2020-04-27 MED ORDER — DIPHENOXYLATE-ATROPINE 2.5-0.025 MG PO TABS
ORAL_TABLET | ORAL | 0 refills | Status: DC
Start: 2020-04-27 — End: 2020-05-12

## 2020-04-27 MED ORDER — CHOLESTYRAMINE 4 G PO PACK: 4.0000 g | PACK | Freq: Three times a day (TID) | ORAL | 12 refills | Status: DC

## 2020-04-29 ENCOUNTER — Other Ambulatory Visit: Payer: Self-pay | Admitting: *Deleted

## 2020-04-29 ENCOUNTER — Other Ambulatory Visit: Payer: Self-pay

## 2020-04-29 ENCOUNTER — Encounter (HOSPITAL_COMMUNITY)
Admission: RE | Admit: 2020-04-29 | Discharge: 2020-04-29 | Disposition: A | Discharge status: Home / Self Care | Payer: Medicare Other | Source: Ambulatory Visit | Attending: Hematology & Oncology | Admitting: Hematology & Oncology

## 2020-04-29 DIAGNOSIS — M419 Scoliosis, unspecified: Secondary | ICD-10-CM | POA: Insufficient documentation

## 2020-04-29 DIAGNOSIS — I2510 Atherosclerotic heart disease of native coronary artery without angina pectoris: Secondary | ICD-10-CM | POA: Insufficient documentation

## 2020-04-29 DIAGNOSIS — C787 Secondary malignant neoplasm of liver and intrahepatic bile duct: Secondary | ICD-10-CM | POA: Insufficient documentation

## 2020-04-29 DIAGNOSIS — I700 Atherosclerosis of aorta: Secondary | ICD-10-CM | POA: Diagnosis not present

## 2020-04-29 DIAGNOSIS — C4372 Malignant melanoma of left lower limb, including hip: Secondary | ICD-10-CM

## 2020-04-29 DIAGNOSIS — C3490 Malignant neoplasm of unspecified part of unspecified bronchus or lung: Secondary | ICD-10-CM | POA: Diagnosis not present

## 2020-04-29 DIAGNOSIS — M4186 Other forms of scoliosis, lumbar region: Secondary | ICD-10-CM | POA: Diagnosis not present

## 2020-04-29 DIAGNOSIS — H748X2 Other specified disorders of left middle ear and mastoid: Secondary | ICD-10-CM | POA: Diagnosis not present

## 2020-04-29 LAB — GLUCOSE, CAPILLARY: Glucose-Capillary: 100 mg/dL — ABNORMAL HIGH (ref 70–99)

## 2020-04-29 IMAGING — PT NM PET TUM IMG RESTAG (PS) SKULL BASE T - THIGH
7 series · 25 of 25 positions shown · non-contrast
Comparison: PET-CT [DATE]

CLINICAL DATA: Subsequent treatment strategy for small cell lung
cancer.

EXAM:
NUCLEAR MEDICINE PET SKULL BASE TO THIGH
TECHNIQUE: 7.9 mCi F-18 FDG was injected intravenously. Full-ring PET imaging
was performed from the skull base to thigh after the radiotracer. CT
data was obtained and used for attenuation correction and anatomic
localization.
Fasting blood glucose: 100 mg/dl

[Series 3: pet sk_thigh ac · axial · 5.0mm · 4.07mm/px · z∈[-982,-98]mm · 6 of 222 slices shown]
[im 1/222]
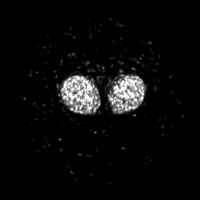
[im 45/222]
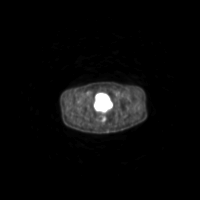
[im 89/222]
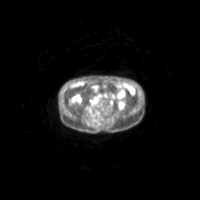
[im 133/222]
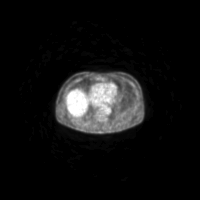
[im 177/222]
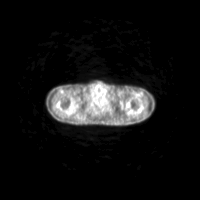
[im 222/222]
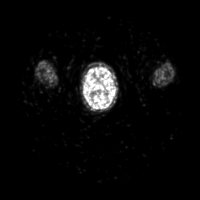

[Series 4: ct sk_thigh 5.0 bf37 · axial · 5.0mm · 0.98mm/px · z∈[-982,-98]mm · 6 of 222 slices shown]
[im 1/222]
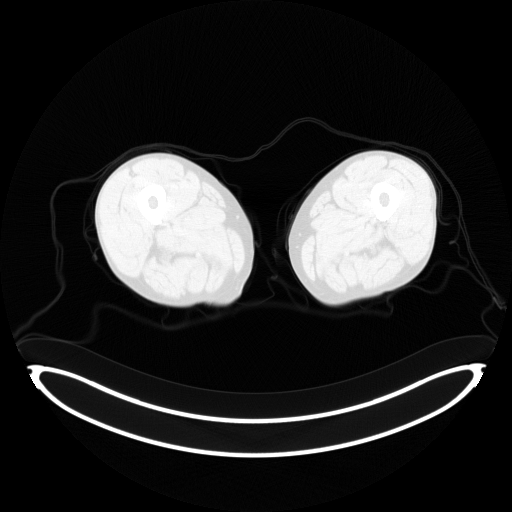
[im 45/222]
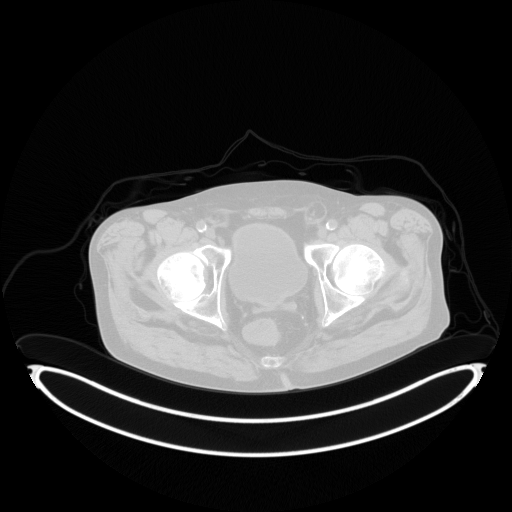
[im 89/222]
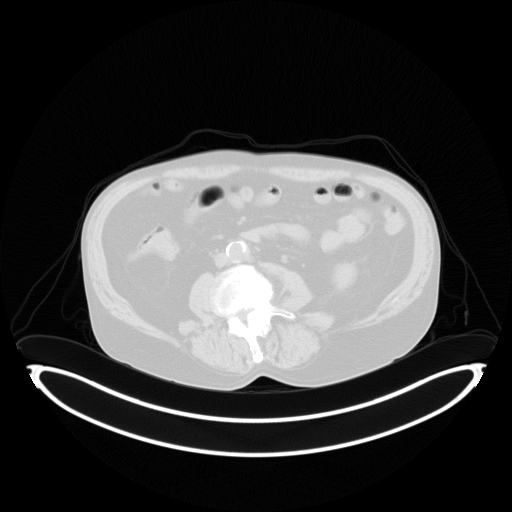
[im 133/222]
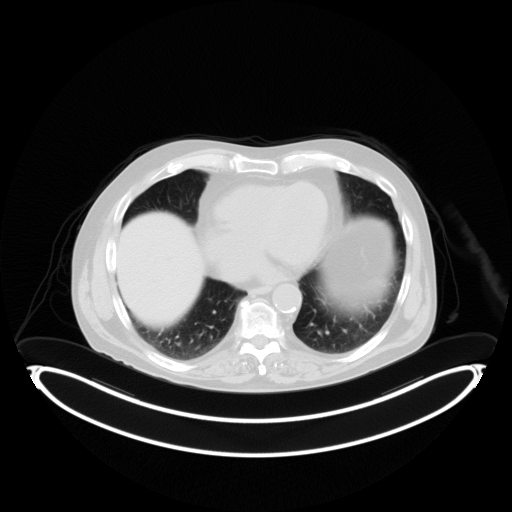
[im 177/222  brain]
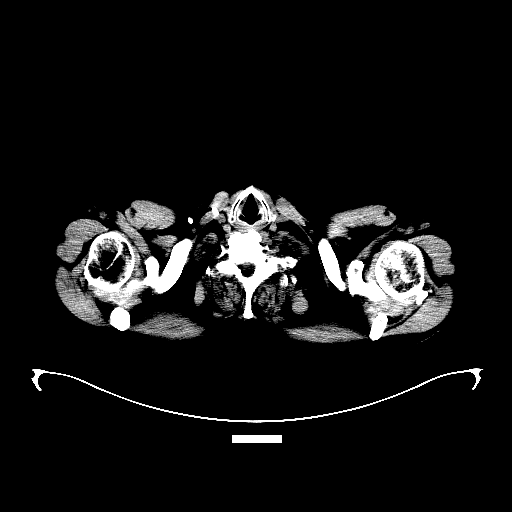
[im 222/222  brain]
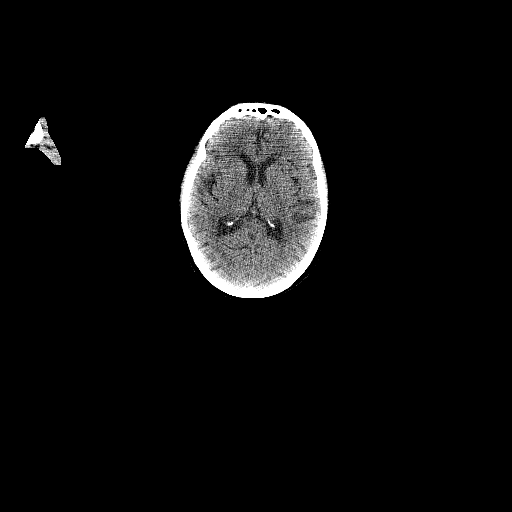

[Series 5: pet sk_thigh nac · axial · 5.0mm · 4.07mm/px · z∈[-982,-98]mm · 5 of 222 slices shown]
[im 1/222]
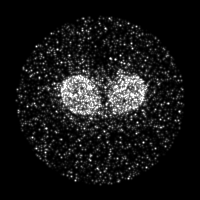
[im 56/222]
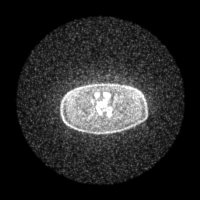
[im 111/222]
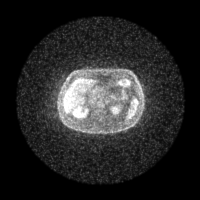
[im 166/222]
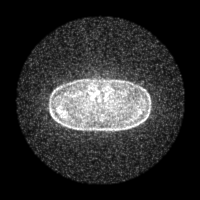
[im 222/222]
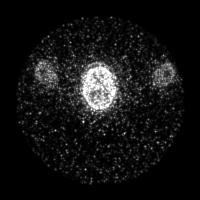

[Series 8: ct sk_thigh 5.0 br59 lung_bone · axial · 5.0mm · 0.65mm/px · 1 of 56 slices shown]
[im 1/56]
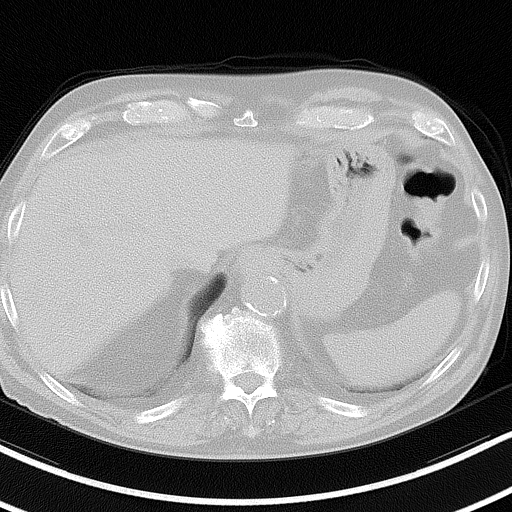

[Series 603: <mip collection> · coronal · 1.83mm/px · 1 of 32 slices shown]
[im 1/32]
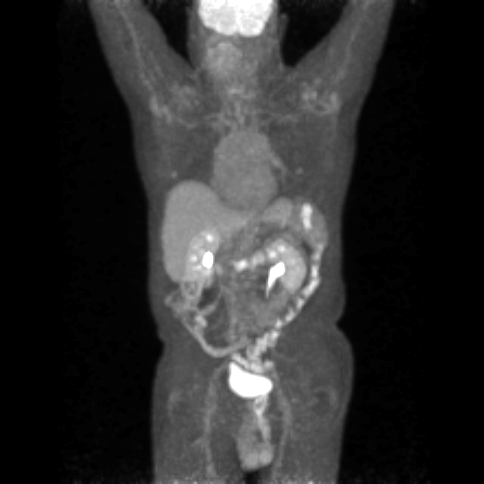

[Series 604: fused cor · 1 of 55 slices shown]
[im 1/55]
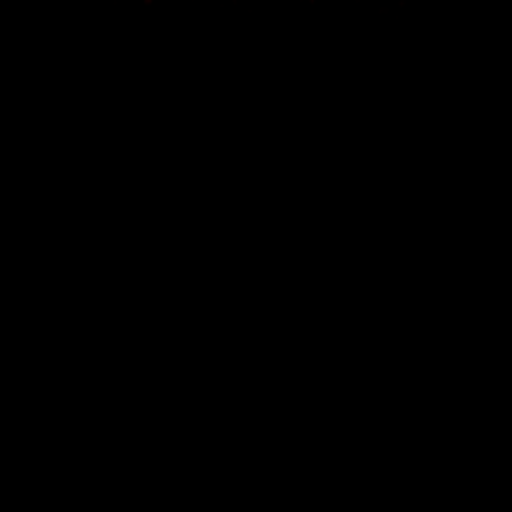

[Series 605: range-ct sk_thigh 5.0 bf37-tra-<alpha range> · 5 of 217 slices shown]
[im 1/217]
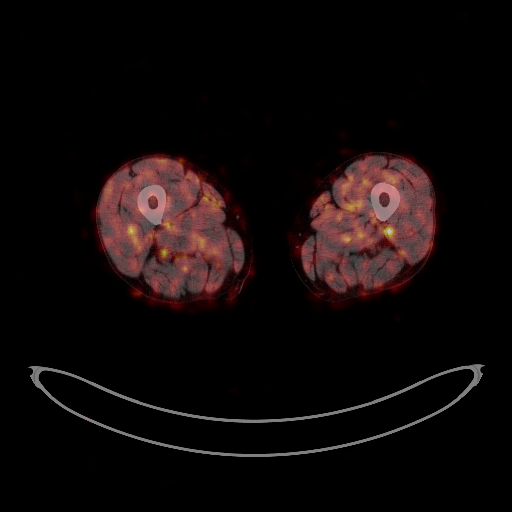
[im 55/217]
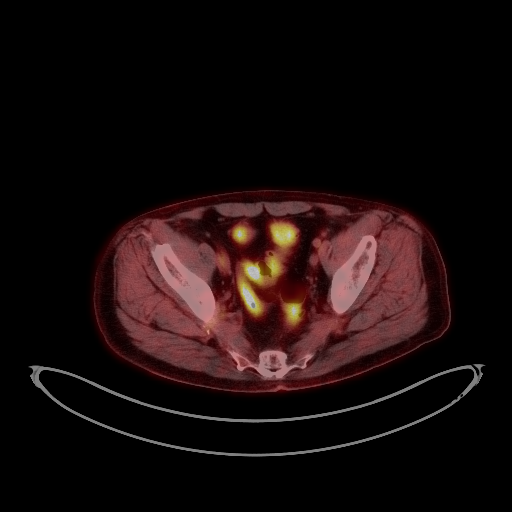
[im 109/217]
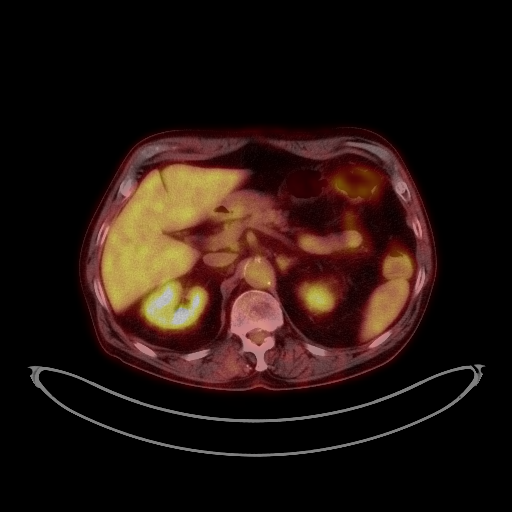
[im 163/217]
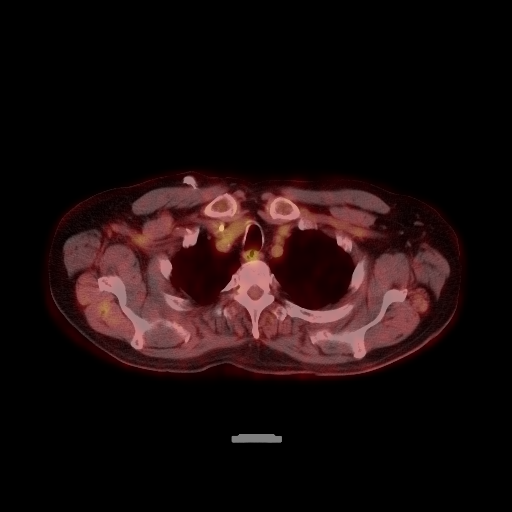
[im 217/217]
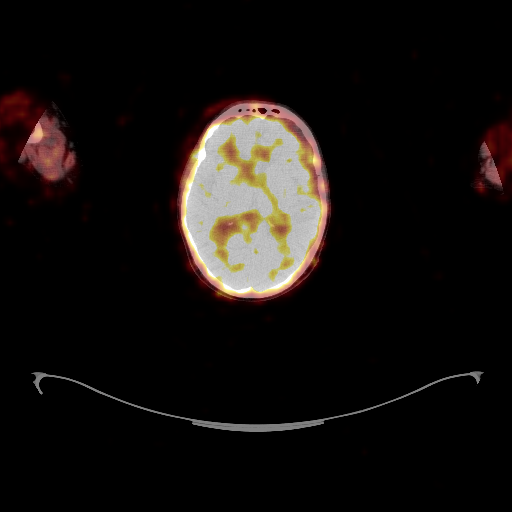

[25 of 25 positions shown; findings below may reference images not displayed]

FINDINGS: Mediastinal blood pool activity: SUV max

Liver activity: SUV max NA

NECK: Previous right level V lymph node has resolved.

Incidental CT findings: Small but improved left mastoid effusion.
Bilateral common carotid atherosclerotic calcification.

CHEST: Index subcarinal lymph node 0.5 cm in short axis on image 76
of series 4 (formerly 1.0 cm) with maximum SUV 2.0 (formerly 9.8).
Small right hilar lymph nodes with maximum SUV 3.0, previously 4.3.
Very small left lower paratracheal lymph node is no longer
hypermetabolic.

Incidental CT findings: Right Port-A-Cath tip: SVC. Coronary, aortic
arch, and branch vessel atherosclerotic vascular disease.

ABDOMEN/PELVIS: Prominent reduction in size and activity of the
hepatic masses. A residual right hepatic lobe hypodense lesion
measuring 2.0 by 1.4 cm on image 100 of series 4 previously measured
4.8 by 3.5 cm, current maximum SUV is 4.0 which is similar to the
rest of the liver, previously 16.1. Similarly, the multiple
additional hypermetabolic liver metastases are markedly reduced in
size and conspicuity and have overall activity similar to or below
that of the liver parenchyma. The porta hepatis and portacaval
adenopathy is no longer hypermetabolic. Index portacaval node 0.6 cm
in short axis on image 113 of series 4, previously 1.3 cm, maximum
SUV 3.2 (formerly 10.6).

Physiologic activity in bowel.

Incidental CT findings: Aortoiliac atherosclerotic vascular disease.

SKELETON: No significant abnormal hypermetabolic activity in this
region.

Incidental CT findings: Stable dextroconvex lumbar scoliosis with
rotary component. Lumbar spondylosis and degenerative disc disease.
Cervical and thoracic spondylosis noted.
IMPRESSION: 1. Marked improvement, with the hepatic metastatic lesions markedly
reduced in size and no longer hypermetabolic; and with the thoracic,
porta hepatis, and right neck level V lymph nodes no longer
hypermetabolic and also reduced in size.
2. Other imaging findings of potential clinical significance: Aortic
Atherosclerosis ([TZ]-[TZ]). Small but improved left mastoid
effusion. Coronary atherosclerosis. Dextroconvex lumbar scoliosis
with rotary component.

## 2020-04-29 MED ORDER — FLUDEOXYGLUCOSE F - 18 (FDG) INJECTION
7.9300 | Freq: Once | INTRAVENOUS | Status: AC
  Administered 2020-04-29: 7.93 via INTRAVENOUS

## 2020-05-04 ENCOUNTER — Other Ambulatory Visit: Payer: Self-pay

## 2020-05-04 ENCOUNTER — Inpatient Hospital Stay: Payer: Medicare Other

## 2020-05-04 ENCOUNTER — Inpatient Hospital Stay (HOSPITAL_BASED_OUTPATIENT_CLINIC_OR_DEPARTMENT_OTHER): Payer: Medicare Other | Admitting: Hematology & Oncology

## 2020-05-04 ENCOUNTER — Encounter: Payer: Self-pay | Admitting: *Deleted

## 2020-05-04 ENCOUNTER — Encounter: Payer: Self-pay | Admitting: Hematology & Oncology

## 2020-05-04 VITALS — BP 132/80 | HR 91 | Temp 98.5°F | Resp 17 | Wt 158.0 lb

## 2020-05-04 DIAGNOSIS — C3431 Malignant neoplasm of lower lobe, right bronchus or lung: Secondary | ICD-10-CM | POA: Diagnosis not present

## 2020-05-04 DIAGNOSIS — C3490 Malignant neoplasm of unspecified part of unspecified bronchus or lung: Secondary | ICD-10-CM

## 2020-05-04 DIAGNOSIS — C4372 Malignant melanoma of left lower limb, including hip: Secondary | ICD-10-CM

## 2020-05-04 DIAGNOSIS — C787 Secondary malignant neoplasm of liver and intrahepatic bile duct: Secondary | ICD-10-CM | POA: Diagnosis not present

## 2020-05-04 DIAGNOSIS — R634 Abnormal weight loss: Secondary | ICD-10-CM | POA: Diagnosis not present

## 2020-05-04 DIAGNOSIS — Z5111 Encounter for antineoplastic chemotherapy: Secondary | ICD-10-CM | POA: Diagnosis not present

## 2020-05-04 DIAGNOSIS — C7931 Secondary malignant neoplasm of brain: Secondary | ICD-10-CM | POA: Diagnosis not present

## 2020-05-04 LAB — CBC WITH DIFFERENTIAL (CANCER CENTER ONLY)
Abs Immature Granulocytes: 0.05 10*3/uL (ref 0.00–0.07)
Basophils Absolute: 0 10*3/uL (ref 0.0–0.1)
Basophils Relative: 0 %
Eosinophils Absolute: 0.1 10*3/uL (ref 0.0–0.5)
Eosinophils Relative: 2 %
HCT: 23.6 % — ABNORMAL LOW (ref 39.0–52.0)
Hemoglobin: 8.2 g/dL — ABNORMAL LOW (ref 13.0–17.0)
Immature Granulocytes: 2 %
Lymphocytes Relative: 30 %
Lymphs Abs: 1 10*3/uL (ref 0.7–4.0)
MCH: 35 pg — ABNORMAL HIGH (ref 26.0–34.0)
MCHC: 34.7 g/dL (ref 30.0–36.0)
MCV: 100.9 fL — ABNORMAL HIGH (ref 80.0–100.0)
Monocytes Absolute: 0.4 10*3/uL (ref 0.1–1.0)
Monocytes Relative: 11 %
Neutro Abs: 1.8 10*3/uL (ref 1.7–7.7)
Neutrophils Relative %: 55 %
Platelet Count: 81 10*3/uL — ABNORMAL LOW (ref 150–400)
RBC: 2.34 MIL/uL — ABNORMAL LOW (ref 4.22–5.81)
RDW: 15.1 % (ref 11.5–15.5)
WBC Count: 3.2 10*3/uL — ABNORMAL LOW (ref 4.0–10.5)
nRBC: 0 % (ref 0.0–0.2)

## 2020-05-04 LAB — CMP (CANCER CENTER ONLY)
ALT: 9 U/L (ref 0–44)
AST: 11 U/L — ABNORMAL LOW (ref 15–41)
Albumin: 4.2 g/dL (ref 3.5–5.0)
Alkaline Phosphatase: 75 U/L (ref 38–126)
Anion gap: 9 (ref 5–15)
BUN: 14 mg/dL (ref 8–23)
CO2: 27 mmol/L (ref 22–32)
Calcium: 9.9 mg/dL (ref 8.9–10.3)
Chloride: 100 mmol/L (ref 98–111)
Creatinine: 1.09 mg/dL (ref 0.61–1.24)
GFR, Estimated: >60 mL/min (ref >60)
Glucose, Bld: 123 mg/dL — ABNORMAL HIGH (ref 70–99)
Potassium: 3.8 mmol/L (ref 3.5–5.1)
Sodium: 136 mmol/L (ref 135–145)
Total Bilirubin: 0.4 mg/dL (ref 0.3–1.2)
Total Protein: 7 g/dL (ref 6.5–8.1)

## 2020-05-04 MED ORDER — ATROPINE SULFATE 1 MG/ML IJ SOLN
0.5000 mg | Freq: Once | INTRAMUSCULAR | Status: AC | PRN
  Administered 2020-05-04: 0.5 mg via INTRAVENOUS

## 2020-05-04 MED ORDER — PALONOSETRON HCL INJECTION 0.25 MG/5ML
0.2500 mg | Freq: Once | INTRAVENOUS | Status: AC
  Administered 2020-05-04: 0.25 mg via INTRAVENOUS

## 2020-05-04 MED ORDER — SODIUM CHLORIDE 0.9 % IV SOLN
150.0000 mg | Freq: Once | INTRAVENOUS | Status: AC
  Administered 2020-05-04: 150 mg via INTRAVENOUS
  Filled 2020-05-04: qty 150

## 2020-05-04 MED ORDER — SODIUM CHLORIDE 0.9 % IV SOLN
10.0000 mg | Freq: Once | INTRAVENOUS | Status: AC
  Administered 2020-05-04: 10 mg via INTRAVENOUS
  Filled 2020-05-04: qty 10

## 2020-05-04 MED ORDER — SODIUM CHLORIDE 0.9 % IV SOLN
60.0000 mg/m2 | Freq: Once | INTRAVENOUS | Status: AC
  Administered 2020-05-04: 120 mg via INTRAVENOUS
  Filled 2020-05-04: qty 6

## 2020-05-04 MED ORDER — ATROPINE SULFATE 1 MG/ML IJ SOLN
INTRAMUSCULAR | Status: AC
  Filled 2020-05-04: qty 1

## 2020-05-04 MED ORDER — SODIUM CHLORIDE 0.9 % IV SOLN
30.0000 mg/m2 | Freq: Once | INTRAVENOUS | Status: AC
  Administered 2020-05-04: 56 mg via INTRAVENOUS
  Filled 2020-05-04: qty 50

## 2020-05-04 MED ORDER — SODIUM CHLORIDE 0.9 % IV SOLN
Freq: Once | INTRAVENOUS | Status: AC
  Filled 2020-05-04: qty 250

## 2020-05-04 MED ORDER — SODIUM CHLORIDE 0.9 % IV SOLN
Freq: Once | INTRAVENOUS | Status: AC
  Filled 2020-05-04: qty 10

## 2020-05-04 MED ORDER — PALONOSETRON HCL INJECTION 0.25 MG/5ML
INTRAVENOUS | Status: AC
  Filled 2020-05-04: qty 5

## 2020-05-04 NOTE — Progress Notes (Signed)
Hematology and Oncology Follow Up Visit  Jhovanny Guinta [id number] [birth year] 69 y.o. 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 Lincoln is here today for follow-up.  I am very surprised as to how well he is done with this third line therapy.  We did do a PET scan on him.  Shockingly, the PET scan showed that he had a fantastic response with a lot of his cancer gone.  His liver looks a whole lot better with a lot of the malignancy not active.  His problems been diarrhea.  He has 3 days diarrhea after the treatment.  I am sure this is from the irinotecan.  We are going to have to make a dosage adjustment on the irinotecan.  I am also going to try him on some Pepto-Bismol to take the day before diarrhea starts.  Typically, the diarrhea starts on Wednesday.  He has had no abdominal pain.  He had a very nice Thanksgiving.  He ate well.  He has had no bleeding.  Is been no fever.  He has had no mouth sores.  Overall, his performance status is ECOG 1.      Medications:  Allergies as of 05/04/2020   No Known Allergies     Medication List       Accurate as of May 04, 2020  8:28 AM. If you have any questions, ask your nurse or doctor.        acetaminophen 500 MG tablet Commonly known as: TYLENOL Take 1,000 mg by mouth every 6 (six) hours as needed for moderate pain.   ALPRAZolam 0.5 MG tablet Commonly known as: XANAX Take 1 tablet (0.5 mg total) by mouth every  6 (six) hours as needed for anxiety.   cholestyramine 4 g packet Commonly known as: Questran Take 1 packet (4 g total) by mouth 3 (three) times daily with meals.   dexamethasone 4 MG tablet Commonly known as: DECADRON Take 2 tablets (8 mg total) by mouth daily. Take daily x 3 days starting the day after cisplatin chemotherapy. Take with food.   diphenoxylate-atropine 2.5-0.025 MG tablet Commonly known as: LOMOTIL Take 2 tablets at onset of diarrhea. Take one tablet after each loose stool. Max 8 tablets per day.   dronabinol 5 MG capsule Commonly known as: MARINOL Take 1 capsule (5 mg total) by mouth 2 (two) times daily before a meal.   finasteride 5 MG tablet Commonly known as: PROSCAR TAKE 1 TABLET BY MOUTH EVERY DAY   furosemide 20 MG tablet Commonly known as: LASIX Take 2 tablets (40 mg total) by mouth daily.   lidocaine-prilocaine cream Commonly known as: EMLA Apply to affected area once   loperamide 2 MG tablet Commonly known as: IMODIUM A-D Take 2 at diarrhea onset, then 1 every 2hr until 12hrs with no BM. May take 2 every 4hrs at night. If diarrhea recurs repeat.   LORazepam 0.5 MG tablet Commonly known as: Ativan Take 1 tablet (0.5 mg total)  by mouth every 6 (six) hours as needed (Nausea or vomiting).   memantine 10 MG tablet Commonly known as: NAMENDA Take 10 mg by mouth daily.   multivitamin capsule Take 1 capsule by mouth daily.   nicotine 21 mg/24hr patch Commonly known as: NICODERM CQ - dosed in mg/24 hours PLACE 1 PATCH (21 MG TOTAL) ONTO THE SKIN DAILY.   omeprazole 40 MG capsule Commonly known as: PRILOSEC Take 1 capsule (40 mg total) by mouth 2 (two) times daily.   ondansetron 4 MG tablet Commonly known as: ZOFRAN Take 1 tablet (4 mg total) by mouth every 6 (six) hours as needed for nausea.   ondansetron 8 MG tablet Commonly known as: Zofran Take 1 tablet (8 mg total) by mouth 2 (two) times daily as needed. Start on the third day after  cisplatin chemotherapy.   OPCON-A OP Place 2 drops into both eyes as needed (for dry eyes).   potassium chloride SA 20 MEQ tablet Commonly known as: KLOR-CON Take 1 tablet (20 mEq total) by mouth daily.   prochlorperazine 10 MG tablet Commonly known as: COMPAZINE Take 1 tablet (10 mg total) by mouth every 6 (six) hours as needed (Nausea or vomiting).   tamsulosin 0.4 MG Caps capsule Commonly known as: FLOMAX Take 1 capsule (0.4 mg total) by mouth daily.       Allergies: No Known Allergies  Past Medical History, Surgical history, Social history, and Family History were reviewed and updated.  Review of Systems: Review of Systems  Constitutional: Positive for weight loss.  HENT: Negative.   Eyes: Negative.   Respiratory: Negative.   Cardiovascular: Negative.   Gastrointestinal: Negative.   Genitourinary: Negative.   Musculoskeletal: Negative.   Skin: Negative.   Neurological: Negative.   Endo/Heme/Allergies: Negative.   Psychiatric/Behavioral: Negative.      Physical Exam:  weight is 158 lb (71.7 kg). His oral temperature is 98.5 F (36.9 C). His blood pressure is 132/80 and his pulse is 91. His respiration is 17 and oxygen saturation is 100%.   Wt Readings from Last 3 Encounters:  05/04/20 158 lb (71.7 kg)  04/13/20 157 lb (71.2 kg)  03/23/20 164 lb (74.4 kg)    Physical Exam Vitals reviewed.  HENT:     Head: Normocephalic and atraumatic.  Eyes:     Pupils: Pupils are equal, round, and reactive to light.  Cardiovascular:     Rate and Rhythm: Normal rate and regular rhythm.     Heart sounds: Normal heart sounds.  Pulmonary:     Effort: Pulmonary effort is normal.     Breath sounds: Normal breath sounds.  Abdominal:     General: Bowel sounds are normal.     Palpations: Abdomen is soft.  Musculoskeletal:        General: No tenderness or deformity. Normal range of motion.     Cervical back: Normal range of motion.  Lymphadenopathy:     Cervical: No  cervical adenopathy.  Skin:    General: Skin is warm and dry.     Findings: No erythema or rash.  Neurological:     Mental Status: He is alert and oriented to person, place, and time.  Psychiatric:        Behavior: Behavior normal.        Thought Content: Thought content normal.        Judgment: Judgment normal.      Lab Results  Component Value Date   WBC 3.2 (L) 05/04/2020   HGB  8.2 (L) 05/04/2020   HCT 23.6 (L) 05/04/2020   MCV 100.9 (H) 05/04/2020   PLT 81 (L) 05/04/2020   Lab Results  Component Value Date   FERRITIN 2,549 (H) 10/21/2019   IRON 43 10/21/2019   TIBC 225 10/21/2019   UIBC 182 10/21/2019   IRONPCTSAT 19 (L) 10/21/2019   Lab Results  Component Value Date   RETICCTPCT 1.5 08/07/2019   RBC 2.34 (L) 05/04/2020   No results found for: KPAFRELGTCHN, LAMBDASER, KAPLAMBRATIO No results found for: IGGSERUM, IGA, IGMSERUM No results found for: Kathrynn Ducking, MSPIKE, SPEI   Chemistry      Component Value Date/Time   NA 135 04/20/2020 0810   NA 142 01/30/2017 0804   K 3.8 04/20/2020 0810   K 4.1 01/30/2017 0804   CL 99 04/20/2020 0810   CL 105 01/25/2016 0926   CO2 28 04/20/2020 0810   CO2 29 01/30/2017 0804   BUN 13 04/20/2020 0810   BUN 6.9 (L) 01/30/2017 0804   CREATININE 1.05 04/20/2020 0810   CREATININE 0.8 01/30/2017 0804      Component Value Date/Time   CALCIUM 10.0 04/20/2020 0810   CALCIUM 10.5 (H) 01/30/2017 0804   ALKPHOS 72 04/20/2020 0810   ALKPHOS 79 01/30/2017 0804   AST 12 (L) 04/20/2020 0810   AST 34 01/30/2017 0804   ALT 11 04/20/2020 0810   ALT 18 01/30/2017 0804   BILITOT 0.3 04/20/2020 0810   BILITOT 1.49 (H) 01/30/2017 0804       Impression and Plan: Mr. Marmolejos is a very pleasant 69 yo caucasian gentleman with history of stage 1b melanoma of the left lower leg.   This really is not a problem right now.  I am glad he is responding.  His quality of life is clearly what is  favorable.  I am happy that he is able to do what he would like to do.  We will continue him on treatment.  We will do 2 more cycles and then repeat a PET scan.  As far as I know, his brain is still doing okay.  Would like to hope that he is responding to treatment.  This will be the start of the second cycle of treatment.  After this cycle, we will evaluate him with a PET scan.  I am glad that the MRI of the brain looked okay.  I think that the irinotecan does have some penetration to the CNS.  We will go ahead with his treatment today.  This will be his third cycle.  We will have to watch his blood counts closely.  I will plan to see him back for the start of his fourth cycle.    Volanda Napoleon, MD 11/29/20218:28 AM

## 2020-05-04 NOTE — Patient Instructions (Signed)
Irinotecan injection What is this medicine? IRINOTECAN (ir in oh TEE kan ) is a chemotherapy drug. It is used to treat colon and rectal cancer. This medicine may be used for other purposes; ask your health care provider or pharmacist if you have questions. COMMON BRAND NAME(S): Camptosar What should I tell my health care provider before I take this medicine? They need to know if you have any of these conditions:  dehydration  diarrhea  infection (especially a virus infection such as chickenpox, cold sores, or herpes)  liver disease  low blood counts, like low white cell, platelet, or red cell counts  low levels of calcium, magnesium, or potassium in the blood  recent or ongoing radiation therapy  an unusual or allergic reaction to irinotecan, other medicines, foods, dyes, or preservatives  pregnant or trying to get pregnant  breast-feeding How should I use this medicine? This drug is given as an infusion into a vein. It is administered in a hospital or clinic by a specially trained health care professional. Talk to your pediatrician regarding the use of this medicine in children. Special care may be needed. Overdosage: If you think you have taken too much of this medicine contact a poison control center or emergency room at once. NOTE: This medicine is only for you. Do not share this medicine with others. What if I miss a dose? It is important not to miss your dose. Call your doctor or health care professional if you are unable to keep an appointment. What may interact with this medicine? This medicine may interact with the following medications:  antiviral medicines for HIV or AIDS  certain antibiotics like rifampin or rifabutin  certain medicines for fungal infections like itraconazole, ketoconazole, posaconazole, and voriconazole  certain medicines for seizures like carbamazepine, phenobarbital, phenotoin  clarithromycin  gemfibrozil  nefazodone  [facility]. John's  Wort This list may not describe all possible interactions. Give your health care provider a list of all the medicines, herbs, non-prescription drugs, or dietary supplements you use. Also tell them if you smoke, drink alcohol, or use illegal drugs. Some items may interact with your medicine. What should I watch for while using this medicine? Your condition will be monitored carefully while you are receiving this medicine. You will need important blood work done while you are taking this medicine. This drug may make you feel generally unwell. This is not uncommon, as chemotherapy can affect healthy cells as well as cancer cells. Report any side effects. Continue your course of treatment even though you feel ill unless your doctor tells you to stop. In some cases, you may be given additional medicines to help with side effects. Follow all directions for their use. You may get drowsy or dizzy. Do not drive, use machinery, or do anything that needs mental alertness until you know how this medicine affects you. Do not stand or sit up quickly, especially if you are an older patient. This reduces the risk of dizzy or fainting spells. Call your health care professional for advice if you get a fever, chills, or sore throat, or other symptoms of a cold or flu. Do not treat yourself. This medicine decreases your body's ability to fight infections. Try to avoid being around people who are sick. Avoid taking products that contain aspirin, acetaminophen, ibuprofen, naproxen, or ketoprofen unless instructed by your doctor. These medicines may hide a fever. This medicine may increase your risk to bruise or bleed. Call your doctor or health care professional if you notice  any unusual bleeding. Be careful brushing and flossing your teeth or using a toothpick because you may get an infection or bleed more easily. If you have any dental work done, tell your dentist you are receiving this medicine. Do not become pregnant while  taking this medicine or for 6 months after stopping it. Women should inform their health care professional if they wish to become pregnant or think they might be pregnant. Men should not father a child while taking this medicine and for 3 months after stopping it. There is potential for serious side effects to an unborn child. Talk to your health care professional for more information. Do not breast-feed an infant while taking this medicine or for 7 days after stopping it. This medicine has caused ovarian failure in some women. This medicine may make it more difficult to get pregnant. Talk to your health care professional if you are concerned about your fertility. This medicine has caused decreased sperm counts in some men. This may make it more difficult to father a child. Talk to your health care professional if you are concerned about your fertility. What side effects may I notice from receiving this medicine? Side effects that you should report to your doctor or health care professional as soon as possible:  allergic reactions like skin rash, itching or hives, swelling of the face, lips, or tongue  chest pain  diarrhea  flushing, runny nose, sweating during infusion  low blood counts - this medicine may decrease the number of white blood cells, red blood cells and platelets. You may be at increased risk for infections and bleeding.  nausea, vomiting  pain, swelling, warmth in the leg  signs of decreased platelets or bleeding - bruising, pinpoint red spots on the skin, black, tarry stools, blood in the urine  signs of infection - fever or chills, cough, sore throat, pain or difficulty passing urine  signs of decreased red blood cells - unusually weak or tired, fainting spells, lightheadedness Side effects that usually do not require medical attention (report to your doctor or health care professional if they continue or are bothersome):  constipation  hair loss  headache  loss of  appetite  mouth sores  stomach pain This list may not describe all possible side effects. Call your doctor for medical advice about side effects. You may report side effects to FDA at 1-800-FDA-1088. Where should I keep my medicine? This drug is given in a hospital or clinic and will not be stored at home. NOTE: This sheet is a summary. It may not cover all possible information. If you have questions about this medicine, talk to your doctor, pharmacist, or health care provider.  2020 Elsevier/Gold Standard (2018-07-13 10:09:17) Cisplatin injection What is this medicine? CISPLATIN (SIS pla tin) is a chemotherapy drug. It targets fast dividing cells, like cancer cells, and causes these cells to die. This medicine is used to treat many types of cancer like bladder, ovarian, and testicular cancers. This medicine may be used for other purposes; ask your health care provider or pharmacist if you have questions. COMMON BRAND NAME(S): Platinol, Platinol -AQ What should I tell my health care provider before I take this medicine? They need to know if you have any of these conditions:  eye disease, vision problems  hearing problems  kidney disease  low blood counts, like white cells, platelets, or red blood cells  tingling of the fingers or toes, or other nerve disorder  an unusual or allergic reaction to cisplatin,  carboplatin, oxaliplatin, other medicines, foods, dyes, or preservatives  pregnant or trying to get pregnant  breast-feeding How should I use this medicine? This drug is given as an infusion into a vein. It is administered in a hospital or clinic by a specially trained health care professional. Talk to your pediatrician regarding the use of this medicine in children. Special care may be needed. Overdosage: If you think you have taken too much of this medicine contact a poison control center or emergency room at once. NOTE: This medicine is only for you. Do not share this  medicine with others. What if I miss a dose? It is important not to miss a dose. Call your doctor or health care professional if you are unable to keep an appointment. What may interact with this medicine? This medicine may interact with the following medications:  foscarnet  certain antibiotics like amikacin, gentamicin, neomycin, polymyxin B, streptomycin, tobramycin, vancomycin This list may not describe all possible interactions. Give your health care provider a list of all the medicines, herbs, non-prescription drugs, or dietary supplements you use. Also tell them if you smoke, drink alcohol, or use illegal drugs. Some items may interact with your medicine. What should I watch for while using this medicine? Your condition will be monitored carefully while you are receiving this medicine. You will need important blood work done while you are taking this medicine. This drug may make you feel generally unwell. This is not uncommon, as chemotherapy can affect healthy cells as well as cancer cells. Report any side effects. Continue your course of treatment even though you feel ill unless your doctor tells you to stop. This medicine may increase your risk of getting an infection. Call your healthcare professional for advice if you get a fever, chills, or sore throat, or other symptoms of a cold or flu. Do not treat yourself. Try to avoid being around people who are sick. Avoid taking medicines that contain aspirin, acetaminophen, ibuprofen, naproxen, or ketoprofen unless instructed by your healthcare professional. These medicines may hide a fever. This medicine may increase your risk to bruise or bleed. Call your doctor or health care professional if you notice any unusual bleeding. Be careful brushing and flossing your teeth or using a toothpick because you may get an infection or bleed more easily. If you have any dental work done, tell your dentist you are receiving this medicine. Do not become  pregnant while taking this medicine or for 14 months after stopping it. Women should inform their healthcare professional if they wish to become pregnant or think they might be pregnant. Men should not father a child while taking this medicine and for 11 months after stopping it. There is potential for serious side effects to an unborn child. Talk to your healthcare professional for more information. Do not breast-feed an infant while taking this medicine. This medicine has caused ovarian failure in some women. This medicine may make it more difficult to get pregnant. Talk to your healthcare professional if you are concerned about your fertility. This medicine has caused decreased sperm counts in some men. This may make it more difficult to father a child. Talk to your healthcare professional if you are concerned about your fertility. Drink fluids as directed while you are taking this medicine. This will help protect your kidneys. Call your doctor or health care professional if you get diarrhea. Do not treat yourself. What side effects may I notice from receiving this medicine? Side effects that you should report  to your doctor or health care professional as soon as possible:  allergic reactions like skin rash, itching or hives, swelling of the face, lips, or tongue  blurred vision  changes in vision  decreased hearing or ringing of the ears  nausea, vomiting  pain, redness, or irritation at site where injected  pain, tingling, numbness in the hands or feet  signs and symptoms of bleeding such as bloody or black, tarry stools; red or dark brown urine; spitting up blood or brown material that looks like coffee grounds; red spots on the skin; unusual bruising or bleeding from the eyes, gums, or nose  signs and symptoms of infection like fever; chills; cough; sore throat; pain or trouble passing urine  signs and symptoms of kidney injury like trouble passing urine or change in the amount of  urine  signs and symptoms of low red blood cells or anemia such as unusually weak or tired; feeling faint or lightheaded; falls; breathing problems Side effects that usually do not require medical attention (report to your doctor or health care professional if they continue or are bothersome):  loss of appetite  mouth sores  muscle cramps This list may not describe all possible side effects. Call your doctor for medical advice about side effects. You may report side effects to FDA at 1-800-FDA-1088. Where should I keep my medicine? This drug is given in a hospital or clinic and will not be stored at home. NOTE: This sheet is a summary. It may not cover all possible information. If you have questions about this medicine, talk to your doctor, pharmacist, or health care provider.  2020 Elsevier/Gold Standard (2018-05-18 15:59:17)

## 2020-05-04 NOTE — Progress Notes (Signed)
Oncology Nurse Navigator Documentation  Oncology Nurse Navigator Flowsheets 05/04/2020  Abnormal Finding Date -  Confirmed Diagnosis Date -  Diagnosis Status -  Planned Course of Treatment -  Phase of Treatment -  Chemotherapy Actual Start Date: -  Radiation Actual Start Date: -  Navigator Follow Up Date: 05/11/2020  Navigator Follow Up Reason: Follow-up Appointment  Navigator Location [facility]  Navigator Encounter Type Treatment;Scan Review  Telephone -  Treatment Initiated Date -  Patient Visit Type MedOnc  Treatment Phase Active Tx  Barriers/Navigation Needs Coordination of Care;Education  Education -  Interventions Psycho-Social Support  Acuity Level 2-Minimal Needs (1-2 Barriers Identified)  Referrals -  Coordination of Care -  Education Method -  Support Groups/Services Friends and Family  Time Spent with Patient 30

## 2020-05-04 NOTE — Patient Instructions (Signed)

## 2020-05-04 NOTE — Progress Notes (Signed)
Ok to treat with platelets of 81 per Dr Marin Olp. dph

## 2020-05-06 ENCOUNTER — Telehealth: Payer: Medicare Other | Admitting: Urology

## 2020-05-11 ENCOUNTER — Inpatient Hospital Stay: Payer: Medicare Other | Attending: Hematology & Oncology

## 2020-05-11 ENCOUNTER — Inpatient Hospital Stay (HOSPITAL_BASED_OUTPATIENT_CLINIC_OR_DEPARTMENT_OTHER): Payer: Medicare Other | Admitting: Hematology & Oncology

## 2020-05-11 ENCOUNTER — Inpatient Hospital Stay: Payer: Medicare Other

## 2020-05-11 ENCOUNTER — Other Ambulatory Visit: Payer: Medicare Other

## 2020-05-11 ENCOUNTER — Ambulatory Visit: Payer: Medicare Other

## 2020-05-11 ENCOUNTER — Encounter: Payer: Self-pay | Admitting: Hematology & Oncology

## 2020-05-11 ENCOUNTER — Other Ambulatory Visit: Payer: Self-pay

## 2020-05-11 ENCOUNTER — Ambulatory Visit: Payer: Medicare Other | Admitting: Hematology & Oncology

## 2020-05-11 ENCOUNTER — Other Ambulatory Visit: Payer: Self-pay | Admitting: *Deleted

## 2020-05-11 ENCOUNTER — Encounter: Payer: Self-pay | Admitting: *Deleted

## 2020-05-11 VITALS — BP 108/77 | HR 113 | Temp 98.6°F | Resp 16 | Wt 155.0 lb

## 2020-05-11 DIAGNOSIS — R000 Tachycardia, unspecified: Secondary | ICD-10-CM | POA: Insufficient documentation

## 2020-05-11 DIAGNOSIS — R197 Diarrhea, unspecified: Secondary | ICD-10-CM | POA: Diagnosis not present

## 2020-05-11 DIAGNOSIS — C7931 Secondary malignant neoplasm of brain: Secondary | ICD-10-CM | POA: Insufficient documentation

## 2020-05-11 DIAGNOSIS — Z79899 Other long term (current) drug therapy: Secondary | ICD-10-CM | POA: Insufficient documentation

## 2020-05-11 DIAGNOSIS — C4372 Malignant melanoma of left lower limb, including hip: Secondary | ICD-10-CM | POA: Diagnosis not present

## 2020-05-11 DIAGNOSIS — I4510 Unspecified right bundle-branch block: Secondary | ICD-10-CM | POA: Diagnosis not present

## 2020-05-11 DIAGNOSIS — C3431 Malignant neoplasm of lower lobe, right bronchus or lung: Secondary | ICD-10-CM | POA: Diagnosis not present

## 2020-05-11 DIAGNOSIS — C3490 Malignant neoplasm of unspecified part of unspecified bronchus or lung: Secondary | ICD-10-CM

## 2020-05-11 DIAGNOSIS — D649 Anemia, unspecified: Secondary | ICD-10-CM

## 2020-05-11 DIAGNOSIS — R634 Abnormal weight loss: Secondary | ICD-10-CM | POA: Diagnosis not present

## 2020-05-11 DIAGNOSIS — I444 Left anterior fascicular block: Secondary | ICD-10-CM | POA: Diagnosis not present

## 2020-05-11 DIAGNOSIS — Z5111 Encounter for antineoplastic chemotherapy: Secondary | ICD-10-CM | POA: Diagnosis not present

## 2020-05-11 DIAGNOSIS — C787 Secondary malignant neoplasm of liver and intrahepatic bile duct: Secondary | ICD-10-CM | POA: Insufficient documentation

## 2020-05-11 LAB — CMP (CANCER CENTER ONLY)
ALT: 11 U/L (ref 0–44)
AST: 12 U/L — ABNORMAL LOW (ref 15–41)
Albumin: 4.2 g/dL (ref 3.5–5.0)
Alkaline Phosphatase: 71 U/L (ref 38–126)
Anion gap: 8 (ref 5–15)
BUN: 17 mg/dL (ref 8–23)
CO2: 28 mmol/L (ref 22–32)
Calcium: 9.9 mg/dL (ref 8.9–10.3)
Chloride: 100 mmol/L (ref 98–111)
Creatinine: 1.13 mg/dL (ref 0.61–1.24)
GFR, Estimated: >60 mL/min (ref >60)
Glucose, Bld: 135 mg/dL — ABNORMAL HIGH (ref 70–99)
Potassium: 3.7 mmol/L (ref 3.5–5.1)
Sodium: 136 mmol/L (ref 135–145)
Total Bilirubin: 0.3 mg/dL (ref 0.3–1.2)
Total Protein: 6.7 g/dL (ref 6.5–8.1)

## 2020-05-11 LAB — CBC WITH DIFFERENTIAL (CANCER CENTER ONLY)
Abs Immature Granulocytes: 0.04 10*3/uL (ref 0.00–0.07)
Basophils Absolute: 0 10*3/uL (ref 0.0–0.1)
Basophils Relative: 0 %
Eosinophils Absolute: 0 10*3/uL (ref 0.0–0.5)
Eosinophils Relative: 1 %
HCT: 23.7 % — ABNORMAL LOW (ref 39.0–52.0)
Hemoglobin: 8.1 g/dL — ABNORMAL LOW (ref 13.0–17.0)
Immature Granulocytes: 2 %
Lymphocytes Relative: 31 %
Lymphs Abs: 0.8 10*3/uL (ref 0.7–4.0)
MCH: 34.3 pg — ABNORMAL HIGH (ref 26.0–34.0)
MCHC: 34.2 g/dL (ref 30.0–36.0)
MCV: 100.4 fL — ABNORMAL HIGH (ref 80.0–100.0)
Monocytes Absolute: 0.4 10*3/uL (ref 0.1–1.0)
Monocytes Relative: 15 %
Neutro Abs: 1.3 10*3/uL — ABNORMAL LOW (ref 1.7–7.7)
Neutrophils Relative %: 51 %
Platelet Count: 70 10*3/uL — ABNORMAL LOW (ref 150–400)
RBC: 2.36 MIL/uL — ABNORMAL LOW (ref 4.22–5.81)
RDW: 15.5 % (ref 11.5–15.5)
WBC Count: 2.6 10*3/uL — ABNORMAL LOW (ref 4.0–10.5)
nRBC: 0 % (ref 0.0–0.2)

## 2020-05-11 LAB — SAMPLE TO BLOOD BANK

## 2020-05-11 LAB — PREPARE RBC (CROSSMATCH)

## 2020-05-11 LAB — TYPE AND SCREEN
ABO/RH(D): O POS
Antibody Screen: NEGATIVE

## 2020-05-11 MED ORDER — PALONOSETRON HCL INJECTION 0.25 MG/5ML
0.2500 mg | Freq: Once | INTRAVENOUS | Status: AC
  Administered 2020-05-11: 0.25 mg via INTRAVENOUS

## 2020-05-11 MED ORDER — PALONOSETRON HCL INJECTION 0.25 MG/5ML
INTRAVENOUS | Status: AC
  Filled 2020-05-11: qty 5

## 2020-05-11 MED ORDER — ATROPINE SULFATE 1 MG/ML IJ SOLN
0.5000 mg | Freq: Once | INTRAMUSCULAR | Status: AC | PRN
  Administered 2020-05-11: 0.5 mg via INTRAVENOUS

## 2020-05-11 MED ORDER — SODIUM CHLORIDE 0.9 % IV SOLN
Freq: Once | INTRAVENOUS | Status: AC
  Filled 2020-05-11: qty 10

## 2020-05-11 MED ORDER — SODIUM CHLORIDE 0.9% FLUSH
10.0000 mL | INTRAVENOUS | Status: DC | PRN
  Administered 2020-05-11: 10 mL
  Filled 2020-05-11: qty 10

## 2020-05-11 MED ORDER — SODIUM CHLORIDE 0.9 % IV SOLN
30.0000 mg/m2 | Freq: Once | INTRAVENOUS | Status: AC
  Administered 2020-05-11: 56 mg via INTRAVENOUS
  Filled 2020-05-11: qty 56

## 2020-05-11 MED ORDER — SODIUM CHLORIDE 0.9 % IV SOLN
150.0000 mg | Freq: Once | INTRAVENOUS | Status: AC
  Administered 2020-05-11: 150 mg via INTRAVENOUS
  Filled 2020-05-11: qty 5

## 2020-05-11 MED ORDER — SODIUM CHLORIDE 0.9 % IV SOLN
60.0000 mg/m2 | Freq: Once | INTRAVENOUS | Status: AC
  Administered 2020-05-11: 120 mg via INTRAVENOUS
  Filled 2020-05-11: qty 6

## 2020-05-11 MED ORDER — ATROPINE SULFATE 1 MG/ML IJ SOLN
INTRAMUSCULAR | Status: AC
  Filled 2020-05-11: qty 1

## 2020-05-11 MED ORDER — SODIUM CHLORIDE 0.9 % IV SOLN
10.0000 mg | Freq: Once | INTRAVENOUS | Status: AC
  Administered 2020-05-11: 10 mg via INTRAVENOUS
  Filled 2020-05-11: qty 10

## 2020-05-11 MED ORDER — SODIUM CHLORIDE 0.9 % IV SOLN
Freq: Once | INTRAVENOUS | Status: AC
  Filled 2020-05-11: qty 250

## 2020-05-11 MED ORDER — HEPARIN SOD (PORK) LOCK FLUSH 100 UNIT/ML IV SOLN
500.0000 [IU] | Freq: Once | INTRAVENOUS | Status: AC | PRN
  Administered 2020-05-11: 500 [IU]
  Filled 2020-05-11: qty 5

## 2020-05-11 NOTE — Patient Instructions (Signed)
[facility] Discharge Instructions for Patients Receiving Chemotherapy  Today you received the following chemotherapy agents Irrinotecan, Cisplatin  To help prevent nausea and vomiting after your treatment, we encourage you to take your nausea medication as directed.   If you develop nausea and vomiting that is not controlled by your nausea medication, call the clinic.   BELOW ARE SYMPTOMS THAT SHOULD BE REPORTED IMMEDIATELY:  *FEVER GREATER THAN 100.5 F  *CHILLS WITH OR WITHOUT FEVER  NAUSEA AND VOMITING THAT IS NOT CONTROLLED WITH YOUR NAUSEA MEDICATION  *UNUSUAL SHORTNESS OF BREATH  *UNUSUAL BRUISING OR BLEEDING  TENDERNESS IN MOUTH AND THROAT WITH OR WITHOUT PRESENCE OF ULCERS  *URINARY PROBLEMS  *BOWEL PROBLEMS  UNUSUAL RASH Items with * indicate a potential emergency and should be followed up as soon as possible.  Feel free to call the clinic should you have any questions or concerns. The clinic phone number is [phone].  Please show the [place] [facility] at check-in to the Emergency Department and triage nurse.

## 2020-05-11 NOTE — Progress Notes (Signed)
Patient is feeling well today. He states he had less diarrhea with his last cycle. He feels slightly weak and will be transfused tomorrow.   Oncology Nurse Navigator Documentation  Oncology Nurse Navigator Flowsheets 05/11/2020  Abnormal Finding Date -  Confirmed Diagnosis Date -  Diagnosis Status -  Planned Course of Treatment -  Phase of Treatment -  Chemotherapy Actual Start Date: -  Radiation Actual Start Date: -  Navigator Follow Up Date: 06/01/2020  Navigator Follow Up Reason: Follow-up Appointment  Navigator Location [facility]  Navigator Encounter Type Treatment  Telephone -  Treatment Initiated Date -  Patient Visit Type MedOnc  Treatment Phase Active Tx  Barriers/Navigation Needs Coordination of Care;Education  Education -  Interventions Psycho-Social Support  Acuity Level 2-Minimal Needs (1-2 Barriers Identified)  Referrals -  Coordination of Care -  Education Method -  Support Groups/Services Friends and Family  Time Spent with Patient 15

## 2020-05-11 NOTE — Progress Notes (Signed)
Pt discharged in no apparent distress. Pt left ambulatory without assistance. Pt aware of discharge instructions and verbalized understanding and had no further questions.  

## 2020-05-11 NOTE — Progress Notes (Signed)
Hematology and Oncology Follow Up Visit  Walter Hernandez [id number] Oct 20, 1950 69 y.o. 05/11/2020   Principle Diagnosis:  Stage IB ([id number]) superficial spreading melanoma of the left lower leg  Small Cell Lung Cancer -- Extensive Stage -- lung/liver/brain mets  Past Therapy: Cranial XRT -- 06/19/2019 thru 07/02/2019  Current Therapy:        Carboplatin/VP-16/Tecentriq -- started on 07/07/2018, s/p cycle #4 Tecentriq - maintenance -- cycle#1 -- start on 10/01/2019 - d/c on 10/21/2019 Zepzelca/Keytruda -- q 3 wk --s/p cycle #6 - started on 10/28/2019 -- d/c on 03/15/2020 CDDP/Irinotecan -- s/p cycle #3 -- start on 03/23/2020   Interim History:  Mr. Carstarphen is here today for follow-up.  This is actually date of his 3rd cycle of treatment.  I am unsure as to why he is in today.  He feels a little bit tired.  His hemoglobin is 8.1.  I really think that we are going to have to transfuse him 2 units of blood.  The diarrhea is a little bit better.  He is eating okay.  He is having no nausea or vomiting.  He is having no fever.  He has had no bleeding.  He has had no leg swelling.  There is no cough or headache.  He was somewhat tachycardic.  I did do an EKG on him.  It showed that he had a right bundle branch block and left anterior fascicular block.  This really looks like it was stable compared to what he had before which was a year ago.  He really is not symptomatic with this.  Overall, his performance status is ECOG 1.      Medications:  Allergies as of 05/11/2020   No Known Allergies     Medication List       Accurate as of May 11, 2020  8:58 AM. If you have any questions, ask your nurse or doctor.        acetaminophen 500 MG tablet Commonly known as: TYLENOL Take 1,000 mg by mouth every 6 (six) hours as needed for moderate pain.   ALPRAZolam 0.5 MG tablet Commonly known as: XANAX Take 1 tablet (0.5 mg total) by mouth every 6 (six) hours as needed for anxiety.    cholestyramine 4 g packet Commonly known as: Questran Take 1 packet (4 g total) by mouth 3 (three) times daily with meals.   dexamethasone 4 MG tablet Commonly known as: DECADRON Take 2 tablets (8 mg total) by mouth daily. Take daily x 3 days starting the day after cisplatin chemotherapy. Take with food.   diphenoxylate-atropine 2.5-0.025 MG tablet Commonly known as: LOMOTIL Take 2 tablets at onset of diarrhea. Take one tablet after each loose stool. Max 8 tablets per day.   dronabinol 5 MG capsule Commonly known as: MARINOL Take 1 capsule (5 mg total) by mouth 2 (two) times daily before a meal.   finasteride 5 MG tablet Commonly known as: PROSCAR TAKE 1 TABLET BY MOUTH EVERY DAY   furosemide 20 MG tablet Commonly known as: LASIX Take 2 tablets (40 mg total) by mouth daily.   lidocaine-prilocaine cream Commonly known as: EMLA Apply to affected area once   loperamide 2 MG tablet Commonly known as: IMODIUM A-D Take 2 at diarrhea onset, then 1 every 2hr until 12hrs with no BM. May take 2 every 4hrs at night. If diarrhea recurs repeat.   LORazepam 0.5 MG tablet Commonly known as: Ativan Take 1 tablet (0.5 mg total) by mouth every 6 (six)  hours as needed (Nausea or vomiting).   memantine 10 MG tablet Commonly known as: NAMENDA Take 10 mg by mouth daily.   multivitamin capsule Take 1 capsule by mouth daily.   nicotine 21 mg/24hr patch Commonly known as: NICODERM CQ - dosed in mg/24 hours PLACE 1 PATCH (21 MG TOTAL) ONTO THE SKIN DAILY.   omeprazole 40 MG capsule Commonly known as: PRILOSEC Take 1 capsule (40 mg total) by mouth 2 (two) times daily.   ondansetron 4 MG tablet Commonly known as: ZOFRAN Take 1 tablet (4 mg total) by mouth every 6 (six) hours as needed for nausea.   ondansetron 8 MG tablet Commonly known as: Zofran Take 1 tablet (8 mg total) by mouth 2 (two) times daily as needed. Start on the third day after cisplatin chemotherapy.   OPCON-A  OP Place 2 drops into both eyes as needed (for dry eyes).   potassium chloride SA 20 MEQ tablet Commonly known as: KLOR-CON Take 1 tablet (20 mEq total) by mouth daily.   prochlorperazine 10 MG tablet Commonly known as: COMPAZINE Take 1 tablet (10 mg total) by mouth every 6 (six) hours as needed (Nausea or vomiting).   tamsulosin 0.4 MG Caps capsule Commonly known as: FLOMAX Take 1 capsule (0.4 mg total) by mouth daily.       Allergies: No Known Allergies  Past Medical History, Surgical history, Social history, and Family History were reviewed and updated.  Review of Systems: Review of Systems  Constitutional: Positive for weight loss.  HENT: Negative.   Eyes: Negative.   Respiratory: Negative.   Cardiovascular: Negative.   Gastrointestinal: Negative.   Genitourinary: Negative.   Musculoskeletal: Negative.   Skin: Negative.   Neurological: Negative.   Endo/Heme/Allergies: Negative.   Psychiatric/Behavioral: Negative.      Physical Exam:  weight is 155 lb (70.3 kg). His oral temperature is 98.6 F (37 C). His blood pressure is 108/77 and his pulse is 113 (abnormal). His respiration is 16 and oxygen saturation is 100%.   Wt Readings from Last 3 Encounters:  05/11/20 155 lb (70.3 kg)  05/04/20 158 lb (71.7 kg)  04/13/20 157 lb (71.2 kg)    Physical Exam Vitals reviewed.  HENT:     Head: Normocephalic and atraumatic.  Eyes:     Pupils: Pupils are equal, round, and reactive to light.  Cardiovascular:     Rate and Rhythm: Normal rate and regular rhythm.     Heart sounds: Normal heart sounds.  Pulmonary:     Effort: Pulmonary effort is normal.     Breath sounds: Normal breath sounds.  Abdominal:     General: Bowel sounds are normal.     Palpations: Abdomen is soft.  Musculoskeletal:        General: No tenderness or deformity. Normal range of motion.     Cervical back: Normal range of motion.  Lymphadenopathy:     Cervical: No cervical adenopathy.  Skin:     General: Skin is warm and dry.     Findings: No erythema or rash.  Neurological:     Mental Status: He is alert and oriented to person, place, and time.  Psychiatric:        Behavior: Behavior normal.        Thought Content: Thought content normal.        Judgment: Judgment normal.      Lab Results  Component Value Date   WBC 2.6 (L) 05/11/2020   HGB 8.1 (L) 05/11/2020  HCT 23.7 (L) 05/11/2020   MCV 100.4 (H) 05/11/2020   PLT 70 (L) 05/11/2020   Lab Results  Component Value Date   FERRITIN 2,549 (H) 10/21/2019   IRON 43 10/21/2019   TIBC 225 10/21/2019   UIBC 182 10/21/2019   IRONPCTSAT 19 (L) 10/21/2019   Lab Results  Component Value Date   RETICCTPCT 1.5 08/07/2019   RBC 2.36 (L) 05/11/2020   No results found for: KPAFRELGTCHN, LAMBDASER, KAPLAMBRATIO No results found for: IGGSERUM, IGA, IGMSERUM No results found for: Odetta Pink, SPEI   Chemistry      Component Value Date/Time   NA 136 05/04/2020 0811   NA 142 01/30/2017 0804   K 3.8 05/04/2020 0811   K 4.1 01/30/2017 0804   CL 100 05/04/2020 0811   CL 105 01/25/2016 0926   CO2 27 05/04/2020 0811   CO2 29 01/30/2017 0804   BUN 14 05/04/2020 0811   BUN 6.9 (L) 01/30/2017 0804   CREATININE 1.09 05/04/2020 0811   CREATININE 0.8 01/30/2017 0804      Component Value Date/Time   CALCIUM 9.9 05/04/2020 0811   CALCIUM 10.5 (H) 01/30/2017 0804   ALKPHOS 75 05/04/2020 0811   ALKPHOS 79 01/30/2017 0804   AST 11 (L) 05/04/2020 0811   AST 34 01/30/2017 0804   ALT 9 05/04/2020 0811   ALT 18 01/30/2017 0804   BILITOT 0.4 05/04/2020 0811   BILITOT 1.49 (H) 01/30/2017 0804       Impression and Plan: Mr. Chrostowski is a very pleasant 69 yo caucasian gentleman with history of stage 1b melanoma of the left lower leg.   This really is not a problem right now.  He now has extensive stage small cell lung cancer.  Again, he is here in for early visit.  I am not sure  why I am seeing him today.  However, we will go ahead and transfuse him tomorrow.  We will give him 2 units of blood.  I think this will make him feel well for Christmas.  We made to make a little bit of a dosage adjustment.  I will plan to see him back  I am glad he is responding.  His quality of life is clearly what is favorable.  I am happy that he is able to do what he would like to do.  We will continue him on treatment.  We will start his fourth cycle in December right after Christmas.  I do think that the blood transfusion will help.     Volanda Napoleon, MD 12/6/20218:58 AM

## 2020-05-11 NOTE — Progress Notes (Signed)
     Ok to treat despite counts per MD, Chemotherapy today, blood products tomorrow.

## 2020-05-11 NOTE — Progress Notes (Signed)
Per Dr. Marin Olp ok to run cisplatin and post hydration fluids together for today's treatment.

## 2020-05-11 NOTE — Patient Instructions (Signed)

## 2020-05-12 ENCOUNTER — Inpatient Hospital Stay: Payer: Medicare Other

## 2020-05-12 ENCOUNTER — Encounter: Payer: Self-pay | Admitting: *Deleted

## 2020-05-12 ENCOUNTER — Other Ambulatory Visit: Payer: Self-pay | Admitting: *Deleted

## 2020-05-12 DIAGNOSIS — C787 Secondary malignant neoplasm of liver and intrahepatic bile duct: Secondary | ICD-10-CM | POA: Diagnosis not present

## 2020-05-12 DIAGNOSIS — C7931 Secondary malignant neoplasm of brain: Secondary | ICD-10-CM | POA: Diagnosis not present

## 2020-05-12 DIAGNOSIS — D649 Anemia, unspecified: Secondary | ICD-10-CM

## 2020-05-12 DIAGNOSIS — C4372 Malignant melanoma of left lower limb, including hip: Secondary | ICD-10-CM | POA: Diagnosis not present

## 2020-05-12 DIAGNOSIS — Z5111 Encounter for antineoplastic chemotherapy: Secondary | ICD-10-CM | POA: Diagnosis not present

## 2020-05-12 DIAGNOSIS — C3431 Malignant neoplasm of lower lobe, right bronchus or lung: Secondary | ICD-10-CM | POA: Diagnosis not present

## 2020-05-12 DIAGNOSIS — R197 Diarrhea, unspecified: Secondary | ICD-10-CM | POA: Diagnosis not present

## 2020-05-12 MED ORDER — SODIUM CHLORIDE 0.9% FLUSH
10.0000 mL | INTRAVENOUS | Status: AC | PRN
  Administered 2020-05-12: 10 mL
  Filled 2020-05-12: qty 10

## 2020-05-12 MED ORDER — ACETAMINOPHEN 325 MG PO TABS
650.0000 mg | ORAL_TABLET | Freq: Once | ORAL | Status: AC
  Administered 2020-05-12: 650 mg via ORAL

## 2020-05-12 MED ORDER — DIPHENOXYLATE-ATROPINE 2.5-0.025 MG PO TABS
ORAL_TABLET | ORAL | 2 refills | Status: DC
Start: 2020-05-12 — End: 2020-11-10

## 2020-05-12 MED ORDER — DIPHENHYDRAMINE HCL 25 MG PO CAPS
25.0000 mg | ORAL_CAPSULE | Freq: Once | ORAL | Status: AC
  Administered 2020-05-12: 25 mg via ORAL

## 2020-05-12 MED ORDER — HEPARIN SOD (PORK) LOCK FLUSH 100 UNIT/ML IV SOLN
500.0000 [IU] | Freq: Every day | INTRAVENOUS | Status: AC | PRN
  Administered 2020-05-12: 500 [IU]
  Filled 2020-05-12: qty 5

## 2020-05-12 MED ORDER — SODIUM CHLORIDE 0.9% IV SOLUTION
250.0000 mL | Freq: Once | INTRAVENOUS | Status: AC
  Administered 2020-05-12: 250 mL via INTRAVENOUS
  Filled 2020-05-12: qty 250

## 2020-05-12 NOTE — Progress Notes (Signed)
Received a call from patient's wife, Walter Hernandez. She would like to know if Walter Hernandez can have a Nurse, adult and Flu shot. She also needs a refill for the lomotil sent to the pharmacy.  Refill request sent to Dr Marin Olp. Spoke to Dr Marin Olp and he is okay with patient getting a covid and flu vaccine. He does not want the patient to get them the same day.  Spoke with patient in the infusion room. Offered to give him the flu vaccine today while getting treatment. He declines and wants to get the flu vaccine later with his wife. Educated patient on getting both vaccines on separate dates per Dr Antonieta Pert direction. He understood with teach back.  Oncology Nurse Navigator Documentation  Oncology Nurse Navigator Flowsheets 05/12/2020  Abnormal Finding Date -  Confirmed Diagnosis Date -  Diagnosis Status -  Planned Course of Treatment -  Phase of Treatment -  Chemotherapy Actual Start Date: -  Radiation Actual Start Date: -  Navigator Follow Up Date: 06/01/2020  Navigator Follow Up Reason: Follow-up Appointment  Navigator Location [facility]  Navigator Encounter Type Treatment;Telephone  Telephone Incoming Call  Treatment Initiated Date -  Patient Visit Type MedOnc  Treatment Phase Active Tx  Barriers/Navigation Needs Coordination of Care;Education  Education Other  Interventions Education;Psycho-Social Support  Acuity Level 2-Minimal Needs (1-2 Barriers Identified)  Referrals -  Coordination of Care -  Education Method Verbal  Support Groups/Services Friends and Family  Time Spent with Patient 30

## 2020-05-12 NOTE — Patient Instructions (Signed)

## 2020-05-12 NOTE — Progress Notes (Signed)
Pt discharged in no apparent distress. Pt left ambulatory without assistance. Pt aware of discharge instructions and verbalized understanding and had no further questions.  

## 2020-05-13 LAB — TYPE AND SCREEN
ABO/RH(D): O POS
Antibody Screen: NEGATIVE
Unit division: 0
Unit division: 0

## 2020-05-13 LAB — BPAM RBC
Blood Product Expiration Date: 202201042359
Blood Product Expiration Date: 202201052359
ISSUE DATE / TIME: 202112070802
ISSUE DATE / TIME: 202112070802
Unit Type and Rh: 5100
Unit Type and Rh: 5100

## 2020-05-19 ENCOUNTER — Encounter: Payer: Self-pay | Admitting: *Deleted

## 2020-05-19 ENCOUNTER — Other Ambulatory Visit: Payer: Self-pay

## 2020-05-19 ENCOUNTER — Inpatient Hospital Stay: Payer: Medicare Other

## 2020-05-19 VITALS — BP 126/86 | HR 91 | Resp 18

## 2020-05-19 DIAGNOSIS — R197 Diarrhea, unspecified: Secondary | ICD-10-CM | POA: Diagnosis not present

## 2020-05-19 DIAGNOSIS — C787 Secondary malignant neoplasm of liver and intrahepatic bile duct: Secondary | ICD-10-CM | POA: Diagnosis not present

## 2020-05-19 DIAGNOSIS — C3431 Malignant neoplasm of lower lobe, right bronchus or lung: Secondary | ICD-10-CM | POA: Diagnosis not present

## 2020-05-19 DIAGNOSIS — C3490 Malignant neoplasm of unspecified part of unspecified bronchus or lung: Secondary | ICD-10-CM

## 2020-05-19 DIAGNOSIS — Z5111 Encounter for antineoplastic chemotherapy: Secondary | ICD-10-CM | POA: Diagnosis not present

## 2020-05-19 DIAGNOSIS — C4372 Malignant melanoma of left lower limb, including hip: Secondary | ICD-10-CM | POA: Diagnosis not present

## 2020-05-19 DIAGNOSIS — C7931 Secondary malignant neoplasm of brain: Secondary | ICD-10-CM | POA: Diagnosis not present

## 2020-05-19 LAB — CBC WITH DIFFERENTIAL (CANCER CENTER ONLY)
Abs Immature Granulocytes: 0.04 10*3/uL (ref 0.00–0.07)
Basophils Absolute: 0 10*3/uL (ref 0.0–0.1)
Basophils Relative: 0 %
Eosinophils Absolute: 0 10*3/uL (ref 0.0–0.5)
Eosinophils Relative: 1 %
HCT: 29.6 % — ABNORMAL LOW (ref 39.0–52.0)
Hemoglobin: 10.2 g/dL — ABNORMAL LOW (ref 13.0–17.0)
Immature Granulocytes: 1 %
Lymphocytes Relative: 30 %
Lymphs Abs: 1 10*3/uL (ref 0.7–4.0)
MCH: 32.4 pg (ref 26.0–34.0)
MCHC: 34.5 g/dL (ref 30.0–36.0)
MCV: 94 fL (ref 80.0–100.0)
Monocytes Absolute: 0.4 10*3/uL (ref 0.1–1.0)
Monocytes Relative: 12 %
Neutro Abs: 1.8 10*3/uL (ref 1.7–7.7)
Neutrophils Relative %: 56 %
Platelet Count: 54 10*3/uL — ABNORMAL LOW (ref 150–400)
RBC: 3.15 MIL/uL — ABNORMAL LOW (ref 4.22–5.81)
RDW: 16.8 % — ABNORMAL HIGH (ref 11.5–15.5)
WBC Count: 3.3 10*3/uL — ABNORMAL LOW (ref 4.0–10.5)
nRBC: 0 % (ref 0.0–0.2)

## 2020-05-19 LAB — CMP (CANCER CENTER ONLY)
ALT: 14 U/L (ref 0–44)
AST: 14 U/L — ABNORMAL LOW (ref 15–41)
Albumin: 4.2 g/dL (ref 3.5–5.0)
Alkaline Phosphatase: 85 U/L (ref 38–126)
Anion gap: 12 (ref 5–15)
BUN: 24 mg/dL — ABNORMAL HIGH (ref 8–23)
CO2: 23 mmol/L (ref 22–32)
Calcium: 10 mg/dL (ref 8.9–10.3)
Chloride: 97 mmol/L — ABNORMAL LOW (ref 98–111)
Creatinine: 1.33 mg/dL — ABNORMAL HIGH (ref 0.61–1.24)
GFR, Estimated: 57 mL/min — ABNORMAL LOW (ref >60)
Glucose, Bld: 94 mg/dL (ref 70–99)
Potassium: 4.5 mmol/L (ref 3.5–5.1)
Sodium: 132 mmol/L — ABNORMAL LOW (ref 135–145)
Total Bilirubin: 0.9 mg/dL (ref 0.3–1.2)
Total Protein: 7 g/dL (ref 6.5–8.1)

## 2020-05-19 MED ORDER — HEPARIN SOD (PORK) LOCK FLUSH 100 UNIT/ML IV SOLN
500.0000 [IU] | Freq: Once | INTRAVENOUS | Status: AC | PRN
  Administered 2020-05-19: 14:00:00 500 [IU]
  Filled 2020-05-19: qty 5

## 2020-05-19 MED ORDER — SODIUM CHLORIDE 0.9 % IV SOLN
Freq: Once | INTRAVENOUS | Status: AC
  Filled 2020-05-19: qty 250

## 2020-05-19 MED ORDER — SODIUM CHLORIDE 0.9% FLUSH
10.0000 mL | Freq: Once | INTRAVENOUS | Status: AC | PRN
  Administered 2020-05-19: 14:00:00 10 mL
  Filled 2020-05-19: qty 10

## 2020-05-19 NOTE — Patient Instructions (Signed)

## 2020-05-19 NOTE — Patient Instructions (Signed)
Implanted Port Insertion, Care After °This sheet gives you information about how to care for yourself after your procedure. Your health care provider may also give you more specific instructions. If you have problems or questions, contact your health care provider. °What can I expect after the procedure? °After the procedure, it is common to have: °· Discomfort at the port insertion site. °· Bruising on the skin over the port. This should improve over 3-4 days. °Follow these instructions at home: °Port care °· After your port is placed, you will get a manufacturer's information card. The card has information about your port. Keep this card with you at all times. °· Take care of the port as told by your health care provider. Ask your health care provider if you or a family member can get training for taking care of the port at home. A home health care nurse may also take care of the port. °· Make sure to remember what type of port you have. °Incision care ° °  ° °· Follow instructions from your health care provider about how to take care of your port insertion site. Make sure you: °? Wash your hands with soap and water before and after you change your bandage (dressing). If soap and water are not available, use hand sanitizer. °? Change your dressing as told by your health care provider. °? Leave stitches (sutures), skin glue, or adhesive strips in place. These skin closures may need to stay in place for 2 weeks or longer. If adhesive strip edges start to loosen and curl up, you may trim the loose edges. Do not remove adhesive strips completely unless your health care provider tells you to do that. °· Check your port insertion site every day for signs of infection. Check for: °? Redness, swelling, or pain. °? Fluid or blood. °? Warmth. °? Pus or a bad smell. °Activity °· Return to your normal activities as told by your health care provider. Ask your health care provider what activities are safe for you. °· Do not  lift anything that is heavier than 10 lb (4.5 kg), or the limit that you are told, until your health care provider says that it is safe. °General instructions °· Take over-the-counter and prescription medicines only as told by your health care provider. °· Do not take baths, swim, or use a hot tub until your health care provider approves. Ask your health care provider if you may take showers. You may only be allowed to take sponge baths. °· Do not drive for 24 hours if you were given a sedative during your procedure. °· Wear a medical alert bracelet in case of an emergency. This will tell any health care providers that you have a port. °· Keep all follow-up visits as told by your health care provider. This is important. °Contact a health care provider if: °· You cannot flush your port with saline as directed, or you cannot draw blood from the port. °· You have a fever or chills. °· You have redness, swelling, or pain around your port insertion site. °· You have fluid or blood coming from your port insertion site. °· Your port insertion site feels warm to the touch. °· You have pus or a bad smell coming from the port insertion site. °Get help right away if: °· You have chest pain or shortness of breath. °· You have bleeding from your port that you cannot control. °Summary °· Take care of the port as told by your health   care provider. Keep the manufacturer's information card with you at all times. °· Change your dressing as told by your health care provider. °· Contact a health care provider if you have a fever or chills or if you have redness, swelling, or pain around your port insertion site. °· Keep all follow-up visits as told by your health care provider. °This information is not intended to replace advice given to you by your health care provider. Make sure you discuss any questions you have with your health care provider. °Document Revised: 12/19/2017 Document Reviewed: 12/19/2017 °Elsevier Patient Education ©  2020 [employer] ° °

## 2020-05-19 NOTE — Progress Notes (Signed)
Message left on voice mail 05/18/20 regarding [name] having diarrhea, nausea and some vomiting. I was out of the office, and outgoing message reflected this.  Called and spoke to [place]. She stated [name] had severe diarhea. He felt like everything he ate "went right through him". He had some nausea and one episode of vomiting. He took an antiemetic which was effective. Colletta Maryland states symptoms began to improve at the end of the day, and he feels better this morning. He has not yet tried to eat.  Reviewed with Colletta Maryland how to reach the desk nurse for times I am out of the office and she needs to speak to someone. She is going to call me this afternoon to update me on patient condition. If diarrhea isn't improved, or patient is unable to drink adequate amounts, we will bring patient in for IVF. She agreed.  Oncology Nurse Navigator Documentation  Oncology Nurse Navigator Flowsheets 05/19/2020  Abnormal Finding Date -  Confirmed Diagnosis Date -  Diagnosis Status -  Planned Course of Treatment -  Phase of Treatment -  Chemotherapy Actual Start Date: -  Radiation Actual Start Date: -  Navigator Follow Up Date: 05/19/2020  Navigator Follow Up Reason: Other:  Production assistant, radio Encounter Type Telephone  Telephone Incoming Call;Symptom Mgt  Treatment Initiated Date -  Patient Visit Type MedOnc  Treatment Phase Active Tx  Barriers/Navigation Needs Coordination of Care;Education  Education Pain/ Symptom Management  Interventions Education;Psycho-Social Support  Acuity Level 2-Minimal Needs (1-2 Barriers Identified)  Referrals -  Coordination of Care -  Education Method Verbal  Support Groups/Services Friends and Family  Time Spent with Patient 30

## 2020-05-19 NOTE — Progress Notes (Signed)
Colletta Maryland called back stating the patient had diarrhea this am and wants to know if he can get fluids today. Appointment made. Dr Marin Olp notified. He would also like lab work drawn. Appointment and orders placed.   Oncology Nurse Navigator Documentation  Oncology Nurse Navigator Flowsheets 05/19/2020  Abnormal Finding Date -  Confirmed Diagnosis Date -  Diagnosis Status -  Planned Course of Treatment -  Phase of Treatment -  Chemotherapy Actual Start Date: -  Radiation Actual Start Date: -  Navigator Follow Up Date: 06/01/2020  Navigator Follow Up Reason: Follow-up Appointment;Chemotherapy  Production assistant, radio Encounter Type Telephone  Telephone Incoming Call  Treatment Initiated Date -  Patient Visit Type MedOnc  Treatment Phase Active Tx  Barriers/Navigation Needs Coordination of Care;Education  Education -  Interventions Coordination of Care;Psycho-Social Support  Acuity Level 2-Minimal Needs (1-2 Barriers Identified)  Referrals -  Coordination of Care Appts  Education Method Verbal  Support Groups/Services Friends and Family  Time Spent with Patient 30

## 2020-05-20 ENCOUNTER — Other Ambulatory Visit: Payer: Self-pay | Admitting: Hematology & Oncology

## 2020-05-27 ENCOUNTER — Other Ambulatory Visit: Payer: Self-pay | Admitting: *Deleted

## 2020-05-27 DIAGNOSIS — Z23 Encounter for immunization: Secondary | ICD-10-CM | POA: Diagnosis not present

## 2020-05-27 DIAGNOSIS — C7931 Secondary malignant neoplasm of brain: Secondary | ICD-10-CM

## 2020-06-01 ENCOUNTER — Other Ambulatory Visit: Payer: Self-pay

## 2020-06-01 ENCOUNTER — Other Ambulatory Visit: Payer: Self-pay | Admitting: *Deleted

## 2020-06-01 ENCOUNTER — Inpatient Hospital Stay: Payer: Medicare Other

## 2020-06-01 ENCOUNTER — Encounter: Payer: Self-pay | Admitting: Hematology & Oncology

## 2020-06-01 ENCOUNTER — Inpatient Hospital Stay (HOSPITAL_BASED_OUTPATIENT_CLINIC_OR_DEPARTMENT_OTHER): Payer: Medicare Other | Admitting: Hematology & Oncology

## 2020-06-01 ENCOUNTER — Encounter: Payer: Self-pay | Admitting: *Deleted

## 2020-06-01 VITALS — BP 118/75 | HR 95 | Temp 98.0°F | Resp 18 | Wt 157.0 lb

## 2020-06-01 DIAGNOSIS — C7931 Secondary malignant neoplasm of brain: Secondary | ICD-10-CM | POA: Diagnosis not present

## 2020-06-01 DIAGNOSIS — C3490 Malignant neoplasm of unspecified part of unspecified bronchus or lung: Secondary | ICD-10-CM

## 2020-06-01 DIAGNOSIS — R197 Diarrhea, unspecified: Secondary | ICD-10-CM | POA: Diagnosis not present

## 2020-06-01 DIAGNOSIS — Z5111 Encounter for antineoplastic chemotherapy: Secondary | ICD-10-CM | POA: Diagnosis not present

## 2020-06-01 DIAGNOSIS — C787 Secondary malignant neoplasm of liver and intrahepatic bile duct: Secondary | ICD-10-CM | POA: Diagnosis not present

## 2020-06-01 DIAGNOSIS — C3431 Malignant neoplasm of lower lobe, right bronchus or lung: Secondary | ICD-10-CM | POA: Diagnosis not present

## 2020-06-01 DIAGNOSIS — C4372 Malignant melanoma of left lower limb, including hip: Secondary | ICD-10-CM

## 2020-06-01 LAB — PREPARE RBC (CROSSMATCH)

## 2020-06-01 LAB — CMP (CANCER CENTER ONLY)
ALT: 9 U/L (ref 0–44)
AST: 14 U/L — ABNORMAL LOW (ref 15–41)
Albumin: 3.9 g/dL (ref 3.5–5.0)
Alkaline Phosphatase: 73 U/L (ref 38–126)
Anion gap: 11 (ref 5–15)
BUN: 15 mg/dL (ref 8–23)
CO2: 26 mmol/L (ref 22–32)
Calcium: 9.6 mg/dL (ref 8.9–10.3)
Chloride: 99 mmol/L (ref 98–111)
Creatinine: 1.3 mg/dL — ABNORMAL HIGH (ref 0.61–1.24)
GFR, Estimated: 59 mL/min — ABNORMAL LOW (ref >60)
Glucose, Bld: 108 mg/dL — ABNORMAL HIGH (ref 70–99)
Potassium: 3.8 mmol/L (ref 3.5–5.1)
Sodium: 136 mmol/L (ref 135–145)
Total Bilirubin: 0.4 mg/dL (ref 0.3–1.2)
Total Protein: 6.4 g/dL — ABNORMAL LOW (ref 6.5–8.1)

## 2020-06-01 LAB — CBC WITH DIFFERENTIAL (CANCER CENTER ONLY)
Abs Immature Granulocytes: 0.03 10*3/uL (ref 0.00–0.07)
Basophils Absolute: 0 10*3/uL (ref 0.0–0.1)
Basophils Relative: 0 %
Eosinophils Absolute: 0 10*3/uL (ref 0.0–0.5)
Eosinophils Relative: 2 %
HCT: 23.8 % — ABNORMAL LOW (ref 39.0–52.0)
Hemoglobin: 8.2 g/dL — ABNORMAL LOW (ref 13.0–17.0)
Immature Granulocytes: 1 %
Lymphocytes Relative: 47 %
Lymphs Abs: 1.1 10*3/uL (ref 0.7–4.0)
MCH: 32.7 pg (ref 26.0–34.0)
MCHC: 34.5 g/dL (ref 30.0–36.0)
MCV: 94.8 fL (ref 80.0–100.0)
Monocytes Absolute: 0.4 10*3/uL (ref 0.1–1.0)
Monocytes Relative: 17 %
Neutro Abs: 0.8 10*3/uL — ABNORMAL LOW (ref 1.7–7.7)
Neutrophils Relative %: 33 %
Platelet Count: 51 10*3/uL — ABNORMAL LOW (ref 150–400)
RBC: 2.51 MIL/uL — ABNORMAL LOW (ref 4.22–5.81)
RDW: 17.5 % — ABNORMAL HIGH (ref 11.5–15.5)
WBC Count: 2.4 10*3/uL — ABNORMAL LOW (ref 4.0–10.5)
nRBC: 0 % (ref 0.0–0.2)

## 2020-06-01 LAB — LACTATE DEHYDROGENASE: LDH: 127 U/L (ref 98–192)

## 2020-06-01 LAB — SAMPLE TO BLOOD BANK

## 2020-06-01 MED ORDER — DIPHENHYDRAMINE HCL 25 MG PO CAPS
25.0000 mg | ORAL_CAPSULE | Freq: Once | ORAL | Status: AC
  Administered 2020-06-01: 11:00:00 25 mg via ORAL

## 2020-06-01 MED ORDER — HEPARIN SOD (PORK) LOCK FLUSH 100 UNIT/ML IV SOLN
500.0000 [IU] | Freq: Once | INTRAVENOUS | Status: AC
  Administered 2020-06-01: 500 [IU] via INTRAVENOUS
  Filled 2020-06-01: qty 5

## 2020-06-01 MED ORDER — ACETAMINOPHEN 325 MG PO TABS
ORAL_TABLET | ORAL | Status: AC
  Filled 2020-06-01: qty 2

## 2020-06-01 MED ORDER — DIPHENHYDRAMINE HCL 25 MG PO CAPS
ORAL_CAPSULE | ORAL | Status: AC
  Filled 2020-06-01: qty 1

## 2020-06-01 MED ORDER — ACETAMINOPHEN 325 MG PO TABS
650.0000 mg | ORAL_TABLET | Freq: Once | ORAL | Status: AC
  Administered 2020-06-01: 11:00:00 650 mg via ORAL

## 2020-06-01 MED ORDER — SODIUM CHLORIDE 0.9% FLUSH
10.0000 mL | INTRAVENOUS | Status: DC | PRN
  Administered 2020-06-01: 10 mL via INTRAVENOUS
  Filled 2020-06-01: qty 10

## 2020-06-01 NOTE — Progress Notes (Signed)
Oncology Nurse Navigator Documentation  Oncology Nurse Navigator Flowsheets 06/01/2020  Abnormal Finding Date -  Confirmed Diagnosis Date -  Diagnosis Status -  Planned Course of Treatment -  Phase of Treatment -  Chemotherapy Actual Start Date: -  Radiation Actual Start Date: -  Navigator Follow Up Date: 06/29/2020  Navigator Follow Up Reason: Follow-up Appointment;Chemotherapy  Navigator Restaurant manager, fast food Encounter Type Treatment  Telephone -  Treatment Initiated Date -  Patient Visit Type MedOnc  Treatment Phase Active Tx  Barriers/Navigation Needs Coordination of Care;Education  Education -  Interventions Psycho-Social Support  Acuity Level 2-Minimal Needs (1-2 Barriers Identified)  Referrals -  Coordination of Care -  Education Method -  Support Groups/Services Friends and Family  Time Spent with Patient 30

## 2020-06-01 NOTE — Patient Instructions (Signed)

## 2020-06-01 NOTE — Progress Notes (Signed)
No treatment per MD, Transfusion 2 units

## 2020-06-01 NOTE — Progress Notes (Signed)
Hematology and Oncology Follow Up Visit  Walter Hernandez [id number] 04/29/1951 69 y.o. 06/01/2020   Principle Diagnosis:  Stage IB ([id number]) superficial spreading melanoma of the left lower leg  Small Cell Lung Cancer -- Extensive Stage -- lung/liver/brain mets  Past Therapy: Cranial XRT -- 06/19/2019 thru 07/02/2019  Current Therapy:        Carboplatin/VP-16/Tecentriq -- started on 07/07/2018, s/p cycle #4 Tecentriq - maintenance -- cycle#1 -- start on 10/01/2019 - d/c on 10/21/2019 Zepzelca/Keytruda -- q 3 wk --s/p cycle #6 - started on 10/28/2019 -- d/c on 03/15/2020 CDDP/Irinotecan -- s/p cycle #3 -- start on 03/23/2020   Interim History:  Mr. Walter Hernandez is here today for follow-up.  He did have a very nice Christmas.  He has wife were at home.  Unfortunately, his platelet count is just too low to be treated today.  I told him this was not good to be a problem.  The chemotherapy still in his system is still needs to work its way out.  He is anemic with a hemoglobin 8.2.  I think he would benefit from a transfusion.  He has done very well with transfusions in the past.  It made him feel a whole lot better in the past.  He agrees to the transfusion.  He has not had any issues with nausea or vomiting.  Has had no diarrhea.  We adjusted the dose of irinotecan which I think is made a difference.  He has had no bleeding.  He has had no fever.  There has been no mouth sores.  Overall, his performance status is ECOG 1.   Medications:  Allergies as of 06/01/2020   No Known Allergies     Medication List       Accurate as of June 01, 2020  8:32 AM. If you have any questions, ask your nurse or doctor.        acetaminophen 500 MG tablet Commonly known as: TYLENOL Take 1,000 mg by mouth every 6 (six) hours as needed for moderate pain.   ALPRAZolam 0.5 MG tablet Commonly known as: XANAX Take 1 tablet (0.5 mg total) by mouth every 6 (six) hours as needed for anxiety.    cholestyramine 4 g packet Commonly known as: Questran Take 1 packet (4 g total) by mouth 3 (three) times daily with meals.   dexamethasone 4 MG tablet Commonly known as: DECADRON Take 2 tablets (8 mg total) by mouth daily. Take daily x 3 days starting the day after cisplatin chemotherapy. Take with food.   diphenoxylate-atropine 2.5-0.025 MG tablet Commonly known as: LOMOTIL Take 2 tablets at onset of diarrhea. Take one tablet after each loose stool. Max 8 tablets per day.   dronabinol 5 MG capsule Commonly known as: MARINOL Take 1 capsule (5 mg total) by mouth 2 (two) times daily before a meal.   finasteride 5 MG tablet Commonly known as: PROSCAR TAKE 1 TABLET BY MOUTH EVERY DAY   furosemide 20 MG tablet Commonly known as: LASIX Take 2 tablets (40 mg total) by mouth daily.   lidocaine-prilocaine cream Commonly known as: EMLA Apply to affected area once   loperamide 2 MG tablet Commonly known as: IMODIUM A-D Take 2 at diarrhea onset, then 1 every 2hr until 12hrs with no BM. May take 2 every 4hrs at night. If diarrhea recurs repeat.   LORazepam 0.5 MG tablet Commonly known as: Ativan Take 1 tablet (0.5 mg total) by mouth every 6 (six) hours as needed (Nausea or vomiting).  memantine 10 MG tablet Commonly known as: NAMENDA Take 10 mg by mouth daily.   multivitamin capsule Take 1 capsule by mouth daily.   nicotine 21 mg/24hr patch Commonly known as: NICODERM CQ - dosed in mg/24 hours PLACE 1 PATCH (21 MG TOTAL) ONTO THE SKIN DAILY.   omeprazole 40 MG capsule Commonly known as: PRILOSEC Take 1 capsule (40 mg total) by mouth 2 (two) times daily.   ondansetron 4 MG tablet Commonly known as: ZOFRAN Take 1 tablet (4 mg total) by mouth every 6 (six) hours as needed for nausea.   ondansetron 8 MG tablet Commonly known as: Zofran Take 1 tablet (8 mg total) by mouth 2 (two) times daily as needed. Start on the third day after cisplatin chemotherapy.   ondansetron 8 MG  disintegrating tablet Commonly known as: ZOFRAN-ODT Take 8 mg by mouth.   OPCON-A OP Place 2 drops into both eyes as needed (for dry eyes).   potassium chloride SA 20 MEQ tablet Commonly known as: KLOR-CON Take 1 tablet (20 mEq total) by mouth daily.   prochlorperazine 10 MG tablet Commonly known as: COMPAZINE Take 1 tablet (10 mg total) by mouth every 6 (six) hours as needed (Nausea or vomiting).   tamsulosin 0.4 MG Caps capsule Commonly known as: FLOMAX Take 1 capsule (0.4 mg total) by mouth daily.       Allergies: No Known Allergies  Past Medical History, Surgical history, Social history, and Family History were reviewed and updated.  Review of Systems: Review of Systems  Constitutional: Positive for weight loss.  HENT: Negative.   Eyes: Negative.   Respiratory: Negative.   Cardiovascular: Negative.   Gastrointestinal: Negative.   Genitourinary: Negative.   Musculoskeletal: Negative.   Skin: Negative.   Neurological: Negative.   Endo/Heme/Allergies: Negative.   Psychiatric/Behavioral: Negative.      Physical Exam:  weight is 157 lb (71.2 kg). His oral temperature is 98 F (36.7 C). His blood pressure is 118/75 and his pulse is 95. His respiration is 18 and oxygen saturation is 99%.   Wt Readings from Last 3 Encounters:  06/01/20 157 lb (71.2 kg)  05/11/20 155 lb (70.3 kg)  05/04/20 158 lb (71.7 kg)    Physical Exam Vitals reviewed.  HENT:     Head: Normocephalic and atraumatic.  Eyes:     Pupils: Pupils are equal, round, and reactive to light.  Cardiovascular:     Rate and Rhythm: Normal rate and regular rhythm.     Heart sounds: Normal heart sounds.  Pulmonary:     Effort: Pulmonary effort is normal.     Breath sounds: Normal breath sounds.  Abdominal:     General: Bowel sounds are normal.     Palpations: Abdomen is soft.  Musculoskeletal:        General: No tenderness or deformity. Normal range of motion.     Cervical back: Normal range of  motion.  Lymphadenopathy:     Cervical: No cervical adenopathy.  Skin:    General: Skin is warm and dry.     Findings: No erythema or rash.  Neurological:     Mental Status: He is alert and oriented to person, place, and time.  Psychiatric:        Behavior: Behavior normal.        Thought Content: Thought content normal.        Judgment: Judgment normal.      Lab Results  Component Value Date   WBC 2.4 (L) 06/01/2020  HGB 8.2 (L) 06/01/2020   HCT 23.8 (L) 06/01/2020   MCV 94.8 06/01/2020   PLT 51 (L) 06/01/2020   Lab Results  Component Value Date   FERRITIN 2,549 (H) 10/21/2019   IRON 43 10/21/2019   TIBC 225 10/21/2019   UIBC 182 10/21/2019   IRONPCTSAT 19 (L) 10/21/2019   Lab Results  Component Value Date   RETICCTPCT 1.5 08/07/2019   RBC 2.51 (L) 06/01/2020   No results found for: KPAFRELGTCHN, LAMBDASER, KAPLAMBRATIO No results found for: IGGSERUM, IGA, IGMSERUM No results found for: Kathrynn Ducking, MSPIKE, SPEI   Chemistry      Component Value Date/Time   NA 132 (L) 05/19/2020 1159   NA 142 01/30/2017 0804   K 4.5 05/19/2020 1159   K 4.1 01/30/2017 0804   CL 97 (L) 05/19/2020 1159   CL 105 01/25/2016 0926   CO2 23 05/19/2020 1159   CO2 29 01/30/2017 0804   BUN 24 (H) 05/19/2020 1159   BUN 6.9 (L) 01/30/2017 0804   CREATININE 1.33 (H) 05/19/2020 1159   CREATININE 0.8 01/30/2017 0804      Component Value Date/Time   CALCIUM 10.0 05/19/2020 1159   CALCIUM 10.5 (H) 01/30/2017 0804   ALKPHOS 85 05/19/2020 1159   ALKPHOS 79 01/30/2017 0804   AST 14 (L) 05/19/2020 1159   AST 34 01/30/2017 0804   ALT 14 05/19/2020 1159   ALT 18 01/30/2017 0804   BILITOT 0.9 05/19/2020 1159   BILITOT 1.49 (H) 01/30/2017 0804       Impression and Plan: Mr. Tolan is a very pleasant 69 yo caucasian gentleman with history of stage 1b melanoma of the left lower leg.   This really is not a problem right now.  He now has extensive  stage small cell lung cancer.  Again, he is not going to receive chemotherapy today.  We will just move everything back 1 week.  I think that his cycles really need to be 28 days.  I will make sure of this will only see him again.  We will plan to see him for his fifth cycle of treatment.    Volanda Napoleon, MD 12/27/20218:32 AM

## 2020-06-02 ENCOUNTER — Inpatient Hospital Stay: Payer: Medicare Other

## 2020-06-02 DIAGNOSIS — C4372 Malignant melanoma of left lower limb, including hip: Secondary | ICD-10-CM | POA: Diagnosis not present

## 2020-06-02 DIAGNOSIS — C7931 Secondary malignant neoplasm of brain: Secondary | ICD-10-CM | POA: Diagnosis not present

## 2020-06-02 DIAGNOSIS — Z5111 Encounter for antineoplastic chemotherapy: Secondary | ICD-10-CM | POA: Diagnosis not present

## 2020-06-02 DIAGNOSIS — C3431 Malignant neoplasm of lower lobe, right bronchus or lung: Secondary | ICD-10-CM | POA: Diagnosis not present

## 2020-06-02 DIAGNOSIS — C787 Secondary malignant neoplasm of liver and intrahepatic bile duct: Secondary | ICD-10-CM | POA: Diagnosis not present

## 2020-06-02 DIAGNOSIS — R197 Diarrhea, unspecified: Secondary | ICD-10-CM | POA: Diagnosis not present

## 2020-06-02 DIAGNOSIS — C3490 Malignant neoplasm of unspecified part of unspecified bronchus or lung: Secondary | ICD-10-CM

## 2020-06-02 MED ORDER — SODIUM CHLORIDE 0.9% IV SOLUTION
250.0000 mL | Freq: Once | INTRAVENOUS | Status: AC
  Administered 2020-06-02: 250 mL via INTRAVENOUS
  Filled 2020-06-02: qty 250

## 2020-06-02 NOTE — Patient Instructions (Signed)

## 2020-06-03 ENCOUNTER — Encounter: Payer: Self-pay | Admitting: *Deleted

## 2020-06-03 ENCOUNTER — Other Ambulatory Visit: Payer: Self-pay | Admitting: Hematology & Oncology

## 2020-06-03 LAB — TYPE AND SCREEN
ABO/RH(D): O POS
Antibody Screen: NEGATIVE
Unit division: 0
Unit division: 0

## 2020-06-03 LAB — BPAM RBC
Blood Product Expiration Date: 202201292359
Blood Product Expiration Date: 202201292359
ISSUE DATE / TIME: 202112280756
ISSUE DATE / TIME: 202112280756
Unit Type and Rh: 5100
Unit Type and Rh: 5100

## 2020-06-08 ENCOUNTER — Inpatient Hospital Stay: Payer: Medicare Other | Attending: Hematology & Oncology

## 2020-06-08 ENCOUNTER — Inpatient Hospital Stay: Payer: Medicare Other

## 2020-06-08 ENCOUNTER — Other Ambulatory Visit: Payer: Self-pay

## 2020-06-08 ENCOUNTER — Other Ambulatory Visit: Payer: Self-pay | Admitting: *Deleted

## 2020-06-08 DIAGNOSIS — R634 Abnormal weight loss: Secondary | ICD-10-CM | POA: Insufficient documentation

## 2020-06-08 DIAGNOSIS — C787 Secondary malignant neoplasm of liver and intrahepatic bile duct: Secondary | ICD-10-CM | POA: Insufficient documentation

## 2020-06-08 DIAGNOSIS — C4372 Malignant melanoma of left lower limb, including hip: Secondary | ICD-10-CM | POA: Diagnosis not present

## 2020-06-08 DIAGNOSIS — D72819 Decreased white blood cell count, unspecified: Secondary | ICD-10-CM | POA: Insufficient documentation

## 2020-06-08 DIAGNOSIS — C7931 Secondary malignant neoplasm of brain: Secondary | ICD-10-CM

## 2020-06-08 DIAGNOSIS — C3490 Malignant neoplasm of unspecified part of unspecified bronchus or lung: Secondary | ICD-10-CM

## 2020-06-08 DIAGNOSIS — C3431 Malignant neoplasm of lower lobe, right bronchus or lung: Secondary | ICD-10-CM | POA: Insufficient documentation

## 2020-06-08 DIAGNOSIS — Z5111 Encounter for antineoplastic chemotherapy: Secondary | ICD-10-CM | POA: Insufficient documentation

## 2020-06-08 DIAGNOSIS — Z79899 Other long term (current) drug therapy: Secondary | ICD-10-CM | POA: Insufficient documentation

## 2020-06-08 LAB — CMP (CANCER CENTER ONLY)
ALT: 8 U/L (ref 0–44)
AST: 12 U/L — ABNORMAL LOW (ref 15–41)
Albumin: 4.1 g/dL (ref 3.5–5.0)
Alkaline Phosphatase: 65 U/L (ref 38–126)
Anion gap: 9 (ref 5–15)
BUN: 17 mg/dL (ref 8–23)
CO2: 27 mmol/L (ref 22–32)
Calcium: 10 mg/dL (ref 8.9–10.3)
Chloride: 98 mmol/L (ref 98–111)
Creatinine: 1.3 mg/dL — ABNORMAL HIGH (ref 0.61–1.24)
GFR, Estimated: 59 mL/min — ABNORMAL LOW (ref >60)
Glucose, Bld: 104 mg/dL — ABNORMAL HIGH (ref 70–99)
Potassium: 3.9 mmol/L (ref 3.5–5.1)
Sodium: 134 mmol/L — ABNORMAL LOW (ref 135–145)
Total Bilirubin: 0.5 mg/dL (ref 0.3–1.2)
Total Protein: 6.4 g/dL — ABNORMAL LOW (ref 6.5–8.1)

## 2020-06-08 LAB — CBC WITH DIFFERENTIAL (CANCER CENTER ONLY)
Abs Immature Granulocytes: 0.08 10*3/uL — ABNORMAL HIGH (ref 0.00–0.07)
Basophils Absolute: 0 10*3/uL (ref 0.0–0.1)
Basophils Relative: 1 %
Eosinophils Absolute: 0.1 10*3/uL (ref 0.0–0.5)
Eosinophils Relative: 1 %
HCT: 31.1 % — ABNORMAL LOW (ref 39.0–52.0)
Hemoglobin: 10.6 g/dL — ABNORMAL LOW (ref 13.0–17.0)
Immature Granulocytes: 2 %
Lymphocytes Relative: 32 %
Lymphs Abs: 1.2 10*3/uL (ref 0.7–4.0)
MCH: 31.5 pg (ref 26.0–34.0)
MCHC: 34.1 g/dL (ref 30.0–36.0)
MCV: 92.3 fL (ref 80.0–100.0)
Monocytes Absolute: 0.8 10*3/uL (ref 0.1–1.0)
Monocytes Relative: 20 %
Neutro Abs: 1.7 10*3/uL (ref 1.7–7.7)
Neutrophils Relative %: 44 %
Platelet Count: 75 10*3/uL — ABNORMAL LOW (ref 150–400)
RBC: 3.37 MIL/uL — ABNORMAL LOW (ref 4.22–5.81)
RDW: 17.2 % — ABNORMAL HIGH (ref 11.5–15.5)
WBC Count: 3.8 10*3/uL — ABNORMAL LOW (ref 4.0–10.5)
nRBC: 0 % (ref 0.0–0.2)

## 2020-06-08 LAB — MAGNESIUM: Magnesium: 1.4 mg/dL — ABNORMAL LOW (ref 1.7–2.4)

## 2020-06-08 MED ORDER — SODIUM CHLORIDE 0.9 % IV SOLN
Freq: Once | INTRAVENOUS | Status: AC
  Filled 2020-06-08: qty 10

## 2020-06-08 MED ORDER — SODIUM CHLORIDE 0.9 % IV SOLN
30.0000 mg/m2 | Freq: Once | INTRAVENOUS | Status: AC
  Administered 2020-06-08: 56 mg via INTRAVENOUS
  Filled 2020-06-08: qty 56

## 2020-06-08 MED ORDER — SODIUM CHLORIDE 0.9 % IV SOLN
10.0000 mg | Freq: Once | INTRAVENOUS | Status: AC
  Administered 2020-06-08: 10 mg via INTRAVENOUS
  Filled 2020-06-08: qty 10

## 2020-06-08 MED ORDER — PALONOSETRON HCL INJECTION 0.25 MG/5ML
INTRAVENOUS | Status: AC
  Filled 2020-06-08: qty 5

## 2020-06-08 MED ORDER — ATROPINE SULFATE 1 MG/ML IJ SOLN
INTRAMUSCULAR | Status: AC
  Filled 2020-06-08: qty 1

## 2020-06-08 MED ORDER — SODIUM CHLORIDE 0.9 % IV SOLN
60.0000 mg/m2 | Freq: Once | INTRAVENOUS | Status: AC
  Administered 2020-06-08: 120 mg via INTRAVENOUS
  Filled 2020-06-08: qty 6

## 2020-06-08 MED ORDER — SODIUM CHLORIDE 0.9 % IV SOLN
Freq: Once | INTRAVENOUS | Status: AC
  Filled 2020-06-08: qty 250

## 2020-06-08 MED ORDER — ATROPINE SULFATE 1 MG/ML IJ SOLN
0.5000 mg | Freq: Once | INTRAMUSCULAR | Status: AC | PRN
  Administered 2020-06-08: 0.5 mg via INTRAVENOUS

## 2020-06-08 MED ORDER — SODIUM CHLORIDE 0.9 % IV SOLN
150.0000 mg | Freq: Once | INTRAVENOUS | Status: AC
  Administered 2020-06-08: 150 mg via INTRAVENOUS
  Filled 2020-06-08: qty 150

## 2020-06-08 MED ORDER — HEPARIN SOD (PORK) LOCK FLUSH 100 UNIT/ML IV SOLN
500.0000 [IU] | Freq: Once | INTRAVENOUS | Status: AC | PRN
  Administered 2020-06-08: 500 [IU]
  Filled 2020-06-08: qty 5

## 2020-06-08 MED ORDER — PALONOSETRON HCL INJECTION 0.25 MG/5ML
0.2500 mg | Freq: Once | INTRAVENOUS | Status: AC
  Administered 2020-06-08: 0.25 mg via INTRAVENOUS

## 2020-06-08 MED ORDER — SODIUM CHLORIDE 0.9% FLUSH
10.0000 mL | INTRAVENOUS | Status: DC | PRN
  Administered 2020-06-08: 10 mL
  Filled 2020-06-08: qty 10

## 2020-06-08 NOTE — Progress Notes (Signed)
Ok to run hydration fluids with cisplatin per MD

## 2020-06-08 NOTE — Progress Notes (Signed)
OK to treat with today's CBC and CMET per order of Dr. Marin Olp.

## 2020-06-08 NOTE — Patient Instructions (Signed)
[facility] Discharge Instructions for Patients Receiving Chemotherapy  Today you received the following chemotherapy agents Cisplatin and Irinotecan  To help prevent nausea and vomiting after your treatment, we encourage you to take your nausea medication as prescribed by MD. **DO NOT TAKE ZOFRAN FOR 3 DAYS AFTER CHEMOTHERAPY**   If you develop nausea and vomiting that is not controlled by your nausea medication, call the clinic.   BELOW ARE SYMPTOMS THAT SHOULD BE REPORTED IMMEDIATELY:  *FEVER GREATER THAN 100.5 F  *CHILLS WITH OR WITHOUT FEVER  NAUSEA AND VOMITING THAT IS NOT CONTROLLED WITH YOUR NAUSEA MEDICATION  *UNUSUAL SHORTNESS OF BREATH  *UNUSUAL BRUISING OR BLEEDING  TENDERNESS IN MOUTH AND THROAT WITH OR WITHOUT PRESENCE OF ULCERS  *URINARY PROBLEMS  *BOWEL PROBLEMS  UNUSUAL RASH Items with * indicate a potential emergency and should be followed up as soon as possible.  Feel free to call the clinic should you have any questions or concerns. The clinic phone number is [phone].  Please show the [facility] at check-in to the Emergency Department and triage nurse.

## 2020-06-08 NOTE — Patient Instructions (Signed)

## 2020-06-11 ENCOUNTER — Other Ambulatory Visit: Payer: Self-pay | Admitting: Hematology & Oncology

## 2020-06-15 ENCOUNTER — Inpatient Hospital Stay: Payer: Medicare Other

## 2020-06-15 ENCOUNTER — Other Ambulatory Visit: Payer: Self-pay

## 2020-06-15 DIAGNOSIS — C3490 Malignant neoplasm of unspecified part of unspecified bronchus or lung: Secondary | ICD-10-CM

## 2020-06-15 DIAGNOSIS — C3431 Malignant neoplasm of lower lobe, right bronchus or lung: Secondary | ICD-10-CM | POA: Diagnosis not present

## 2020-06-15 DIAGNOSIS — C787 Secondary malignant neoplasm of liver and intrahepatic bile duct: Secondary | ICD-10-CM | POA: Diagnosis not present

## 2020-06-15 DIAGNOSIS — C7931 Secondary malignant neoplasm of brain: Secondary | ICD-10-CM | POA: Diagnosis not present

## 2020-06-15 DIAGNOSIS — Z5111 Encounter for antineoplastic chemotherapy: Secondary | ICD-10-CM | POA: Diagnosis not present

## 2020-06-15 DIAGNOSIS — R634 Abnormal weight loss: Secondary | ICD-10-CM | POA: Diagnosis not present

## 2020-06-15 DIAGNOSIS — C4372 Malignant melanoma of left lower limb, including hip: Secondary | ICD-10-CM | POA: Diagnosis not present

## 2020-06-15 LAB — CMP (CANCER CENTER ONLY)
ALT: 11 U/L (ref 0–44)
AST: 12 U/L — ABNORMAL LOW (ref 15–41)
Albumin: 4.1 g/dL (ref 3.5–5.0)
Alkaline Phosphatase: 71 U/L (ref 38–126)
Anion gap: 9 (ref 5–15)
BUN: 16 mg/dL (ref 8–23)
CO2: 27 mmol/L (ref 22–32)
Calcium: 9.9 mg/dL (ref 8.9–10.3)
Chloride: 99 mmol/L (ref 98–111)
Creatinine: 1.14 mg/dL (ref 0.61–1.24)
GFR, Estimated: >60 mL/min
Glucose, Bld: 103 mg/dL — ABNORMAL HIGH (ref 70–99)
Potassium: 4 mmol/L (ref 3.5–5.1)
Sodium: 135 mmol/L (ref 135–145)
Total Bilirubin: 0.5 mg/dL (ref 0.3–1.2)
Total Protein: 6.5 g/dL (ref 6.5–8.1)

## 2020-06-15 LAB — CBC WITH DIFFERENTIAL (CANCER CENTER ONLY)
Abs Immature Granulocytes: 0.27 K/uL — ABNORMAL HIGH (ref 0.00–0.07)
Basophils Absolute: 0 K/uL (ref 0.0–0.1)
Basophils Relative: 1 %
Eosinophils Absolute: 0 K/uL (ref 0.0–0.5)
Eosinophils Relative: 1 %
HCT: 30.3 % — ABNORMAL LOW (ref 39.0–52.0)
Hemoglobin: 10.4 g/dL — ABNORMAL LOW (ref 13.0–17.0)
Immature Granulocytes: 5 %
Lymphocytes Relative: 25 %
Lymphs Abs: 1.3 K/uL (ref 0.7–4.0)
MCH: 31.6 pg (ref 26.0–34.0)
MCHC: 34.3 g/dL (ref 30.0–36.0)
MCV: 92.1 fL (ref 80.0–100.0)
Monocytes Absolute: 0.7 K/uL (ref 0.1–1.0)
Monocytes Relative: 13 %
Neutro Abs: 3 K/uL (ref 1.7–7.7)
Neutrophils Relative %: 55 %
Platelet Count: 106 K/uL — ABNORMAL LOW (ref 150–400)
RBC: 3.29 MIL/uL — ABNORMAL LOW (ref 4.22–5.81)
RDW: 17.1 % — ABNORMAL HIGH (ref 11.5–15.5)
WBC Count: 5.3 K/uL (ref 4.0–10.5)
nRBC: 0 % (ref 0.0–0.2)

## 2020-06-15 MED ORDER — SODIUM CHLORIDE 0.9 % IV SOLN
150.0000 mg | Freq: Once | INTRAVENOUS | Status: AC
  Administered 2020-06-15: 150 mg via INTRAVENOUS
  Filled 2020-06-15: qty 150

## 2020-06-15 MED ORDER — SODIUM CHLORIDE 0.9 % IV SOLN: Freq: Once | INTRAVENOUS | Status: DC

## 2020-06-15 MED ORDER — ATROPINE SULFATE 1 MG/ML IJ SOLN
INTRAMUSCULAR | Status: AC
  Filled 2020-06-15: qty 1

## 2020-06-15 MED ORDER — PALONOSETRON HCL INJECTION 0.25 MG/5ML
0.2500 mg | Freq: Once | INTRAVENOUS | Status: AC
  Administered 2020-06-15: 0.25 mg via INTRAVENOUS

## 2020-06-15 MED ORDER — SODIUM CHLORIDE 0.9 % IV SOLN
Freq: Once | INTRAVENOUS | Status: AC
  Filled 2020-06-15: qty 250

## 2020-06-15 MED ORDER — POTASSIUM CHLORIDE IN NACL 20-0.9 MEQ/L-% IV SOLN
Freq: Once | INTRAVENOUS | Status: AC
  Filled 2020-06-15: qty 1000

## 2020-06-15 MED ORDER — ATROPINE SULFATE 1 MG/ML IJ SOLN
0.5000 mg | Freq: Once | INTRAMUSCULAR | Status: AC | PRN
  Administered 2020-06-15: 0.5 mg via INTRAVENOUS

## 2020-06-15 MED ORDER — PALONOSETRON HCL INJECTION 0.25 MG/5ML
INTRAVENOUS | Status: AC
  Filled 2020-06-15: qty 5

## 2020-06-15 MED ORDER — HEPARIN SOD (PORK) LOCK FLUSH 100 UNIT/ML IV SOLN
500.0000 [IU] | Freq: Once | INTRAVENOUS | Status: AC | PRN
  Administered 2020-06-15: 500 [IU]
  Filled 2020-06-15: qty 5

## 2020-06-15 MED ORDER — SODIUM CHLORIDE 0.9 % IV SOLN
30.0000 mg/m2 | Freq: Once | INTRAVENOUS | Status: AC
  Administered 2020-06-15: 56 mg via INTRAVENOUS
  Filled 2020-06-15: qty 56

## 2020-06-15 MED ORDER — SODIUM CHLORIDE 0.9 % IV SOLN
60.0000 mg/m2 | Freq: Once | INTRAVENOUS | Status: AC
  Administered 2020-06-15: 120 mg via INTRAVENOUS
  Filled 2020-06-15: qty 6

## 2020-06-15 MED ORDER — MAGNESIUM SULFATE 2 GM/50ML IV SOLN
2.0000 g | Freq: Once | INTRAVENOUS | Status: DC
  Administered 2020-06-15: 2 g via INTRAVENOUS
  Filled 2020-06-15: qty 50

## 2020-06-15 MED ORDER — SODIUM CHLORIDE 0.9% FLUSH
10.0000 mL | INTRAVENOUS | Status: DC | PRN
  Administered 2020-06-15: 10 mL
  Filled 2020-06-15: qty 10

## 2020-06-15 MED ORDER — SODIUM CHLORIDE 0.9 % IV SOLN
Freq: Once | INTRAVENOUS | Status: DC
  Filled 2020-06-15: qty 100

## 2020-06-15 MED ORDER — SODIUM CHLORIDE 0.9 % IV SOLN
10.0000 mg | Freq: Once | INTRAVENOUS | Status: AC
  Administered 2020-06-15: 10 mg via INTRAVENOUS
  Filled 2020-06-15: qty 10

## 2020-06-15 NOTE — Patient Instructions (Signed)
Cisplatin injection What is this medicine? CISPLATIN (SIS pla tin) is a chemotherapy drug. It targets fast dividing cells, like cancer cells, and causes these cells to die. This medicine is used to treat many types of cancer like bladder, ovarian, and testicular cancers. This medicine may be used for other purposes; ask your health care provider or pharmacist if you have questions. COMMON BRAND NAME(S): Platinol, Platinol -AQ What should I tell my health care provider before I take this medicine? They need to know if you have any of these conditions:  eye disease, vision problems  hearing problems  kidney disease  low blood counts, like white cells, platelets, or red blood cells  tingling of the fingers or toes, or other nerve disorder  an unusual or allergic reaction to cisplatin, carboplatin, oxaliplatin, other medicines, foods, dyes, or preservatives  pregnant or trying to get pregnant  breast-feeding How should I use this medicine? This drug is given as an infusion into a vein. It is administered in a hospital or clinic by a specially trained health care professional. Talk to your pediatrician regarding the use of this medicine in children. Special care may be needed. Overdosage: If you think you have taken too much of this medicine contact a poison control center or emergency room at once. NOTE: This medicine is only for you. Do not share this medicine with others. What if I miss a dose? It is important not to miss a dose. Call your doctor or health care professional if you are unable to keep an appointment. What may interact with this medicine? This medicine may interact with the following medications:  foscarnet  certain antibiotics like amikacin, gentamicin, neomycin, polymyxin B, streptomycin, tobramycin, vancomycin This list may not describe all possible interactions. Give your health care provider a list of all the medicines, herbs, non-prescription drugs, or dietary  supplements you use. Also tell them if you smoke, drink alcohol, or use illegal drugs. Some items may interact with your medicine. What should I watch for while using this medicine? Your condition will be monitored carefully while you are receiving this medicine. You will need important blood work done while you are taking this medicine. This drug may make you feel generally unwell. This is not uncommon, as chemotherapy can affect healthy cells as well as cancer cells. Report any side effects. Continue your course of treatment even though you feel ill unless your doctor tells you to stop. This medicine may increase your risk of getting an infection. Call your healthcare professional for advice if you get a fever, chills, or sore throat, or other symptoms of a cold or flu. Do not treat yourself. Try to avoid being around people who are sick. Avoid taking medicines that contain aspirin, acetaminophen, ibuprofen, naproxen, or ketoprofen unless instructed by your healthcare professional. These medicines may hide a fever. This medicine may increase your risk to bruise or bleed. Call your doctor or health care professional if you notice any unusual bleeding. Be careful brushing and flossing your teeth or using a toothpick because you may get an infection or bleed more easily. If you have any dental work done, tell your dentist you are receiving this medicine. Do not become pregnant while taking this medicine or for 14 months after stopping it. Women should inform their healthcare professional if they wish to become pregnant or think they might be pregnant. Men should not father a child while taking this medicine and for 11 months after stopping it. There is potential for  serious side effects to an unborn child. Talk to your healthcare professional for more information. Do not breast-feed an infant while taking this medicine. This medicine has caused ovarian failure in some women. This medicine may make it more  difficult to get pregnant. Talk to your healthcare professional if you are concerned about your fertility. This medicine has caused decreased sperm counts in some men. This may make it more difficult to father a child. Talk to your healthcare professional if you are concerned about your fertility. Drink fluids as directed while you are taking this medicine. This will help protect your kidneys. Call your doctor or health care professional if you get diarrhea. Do not treat yourself. What side effects may I notice from receiving this medicine? Side effects that you should report to your doctor or health care professional as soon as possible:  allergic reactions like skin rash, itching or hives, swelling of the face, lips, or tongue  blurred vision  changes in vision  decreased hearing or ringing of the ears  nausea, vomiting  pain, redness, or irritation at site where injected  pain, tingling, numbness in the hands or feet  signs and symptoms of bleeding such as bloody or black, tarry stools; red or dark brown urine; spitting up blood or brown material that looks like coffee grounds; red spots on the skin; unusual bruising or bleeding from the eyes, gums, or nose  signs and symptoms of infection like fever; chills; cough; sore throat; pain or trouble passing urine  signs and symptoms of kidney injury like trouble passing urine or change in the amount of urine  signs and symptoms of low red blood cells or anemia such as unusually weak or tired; feeling faint or lightheaded; falls; breathing problems Side effects that usually do not require medical attention (report to your doctor or health care professional if they continue or are bothersome):  loss of appetite  mouth sores  muscle cramps This list may not describe all possible side effects. Call your doctor for medical advice about side effects. You may report side effects to FDA at 1-800-FDA-1088. Where should I keep my  medicine? This drug is given in a hospital or clinic and will not be stored at home. NOTE: This sheet is a summary. It may not cover all possible information. If you have questions about this medicine, talk to your doctor, pharmacist, or health care provider.  2021 Elsevier/Gold Standard (2018-05-18 15:59:17) Irinotecan injection What is this medicine? IRINOTECAN (ir in oh TEE kan ) is a chemotherapy drug. It is used to treat colon and rectal cancer. This medicine may be used for other purposes; ask your health care provider or pharmacist if you have questions. COMMON BRAND NAME(S): Camptosar What should I tell my health care provider before I take this medicine? They need to know if you have any of these conditions:  dehydration  diarrhea  infection (especially a virus infection such as chickenpox, cold sores, or herpes)  liver disease  low blood counts, like low white cell, platelet, or red cell counts  low levels of calcium, magnesium, or potassium in the blood  recent or ongoing radiation therapy  an unusual or allergic reaction to irinotecan, other medicines, foods, dyes, or preservatives  pregnant or trying to get pregnant  breast-feeding How should I use this medicine? This drug is given as an infusion into a vein. It is administered in a hospital or clinic by a specially trained health care professional. Talk to your pediatrician  regarding the use of this medicine in children. Special care may be needed. Overdosage: If you think you have taken too much of this medicine contact a poison control center or emergency room at once. NOTE: This medicine is only for you. Do not share this medicine with others. What if I miss a dose? It is important not to miss your dose. Call your doctor or health care professional if you are unable to keep an appointment. What may interact with this medicine? Do not take this medicine with any of the following  medications:  cobicistat  itraconazole This medicine may interact with the following medications:  antiviral medicines for HIV or AIDS  certain antibiotics like rifampin or rifabutin  certain medicines for fungal infections like ketoconazole, posaconazole, and voriconazole  certain medicines for seizures like carbamazepine, phenobarbital, phenotoin  clarithromycin  gemfibrozil  nefazodone  St. John's Wort This list may not describe all possible interactions. Give your health care provider a list of all the medicines, herbs, non-prescription drugs, or dietary supplements you use. Also tell them if you smoke, drink alcohol, or use illegal drugs. Some items may interact with your medicine. What should I watch for while using this medicine? Your condition will be monitored carefully while you are receiving this medicine. You will need important blood work done while you are taking this medicine. This drug may make you feel generally unwell. This is not uncommon, as chemotherapy can affect healthy cells as well as cancer cells. Report any side effects. Continue your course of treatment even though you feel ill unless your doctor tells you to stop. In some cases, you may be given additional medicines to help with side effects. Follow all directions for their use. You may get drowsy or dizzy. Do not drive, use machinery, or do anything that needs mental alertness until you know how this medicine affects you. Do not stand or sit up quickly, especially if you are an older patient. This reduces the risk of dizzy or fainting spells. Call your health care professional for advice if you get a fever, chills, or sore throat, or other symptoms of a cold or flu. Do not treat yourself. This medicine decreases your body's ability to fight infections. Try to avoid being around people who are sick. Avoid taking products that contain aspirin, acetaminophen, ibuprofen, naproxen, or ketoprofen unless instructed  by your doctor. These medicines may hide a fever. This medicine may increase your risk to bruise or bleed. Call your doctor or health care professional if you notice any unusual bleeding. Be careful brushing and flossing your teeth or using a toothpick because you may get an infection or bleed more easily. If you have any dental work done, tell your dentist you are receiving this medicine. Do not become pregnant while taking this medicine or for 6 months after stopping it. Women should inform their health care professional if they wish to become pregnant or think they might be pregnant. Men should not father a child while taking this medicine and for 3 months after stopping it. There is potential for serious side effects to an unborn child. Talk to your health care professional for more information. Do not breast-feed an infant while taking this medicine or for 7 days after stopping it. This medicine has caused ovarian failure in some women. This medicine may make it more difficult to get pregnant. Talk to your health care professional if you are concerned about your fertility. This medicine has caused decreased sperm counts in  some men. This may make it more difficult to father a child. Talk to your health care professional if you are concerned about your fertility. What side effects may I notice from receiving this medicine? Side effects that you should report to your doctor or health care professional as soon as possible:  allergic reactions like skin rash, itching or hives, swelling of the face, lips, or tongue  chest pain  diarrhea  flushing, runny nose, sweating during infusion  low blood counts - this medicine may decrease the number of white blood cells, red blood cells and platelets. You may be at increased risk for infections and bleeding.  nausea, vomiting  pain, swelling, warmth in the leg  signs of decreased platelets or bleeding - bruising, pinpoint red spots on the skin, black,  tarry stools, blood in the urine  signs of infection - fever or chills, cough, sore throat, pain or difficulty passing urine  signs of decreased red blood cells - unusually weak or tired, fainting spells, lightheadedness Side effects that usually do not require medical attention (report to your doctor or health care professional if they continue or are bothersome):  constipation  hair loss  headache  loss of appetite  mouth sores  stomach pain This list may not describe all possible side effects. Call your doctor for medical advice about side effects. You may report side effects to FDA at 1-800-FDA-1088. Where should I keep my medicine? This drug is given in a hospital or clinic and will not be stored at home. NOTE: This sheet is a summary. It may not cover all possible information. If you have questions about this medicine, talk to your doctor, pharmacist, or health care provider.  2021 Elsevier/Gold Standard (2019-04-23 17:46:13)

## 2020-06-29 ENCOUNTER — Ambulatory Visit: Payer: Medicare Other | Admitting: Hematology & Oncology

## 2020-06-29 ENCOUNTER — Other Ambulatory Visit: Payer: Medicare Other

## 2020-06-29 ENCOUNTER — Ambulatory Visit: Payer: Medicare Other

## 2020-07-01 ENCOUNTER — Other Ambulatory Visit: Payer: Self-pay

## 2020-07-01 ENCOUNTER — Encounter: Payer: Self-pay | Admitting: Urology

## 2020-07-01 ENCOUNTER — Telehealth: Payer: Self-pay

## 2020-07-01 NOTE — Telephone Encounter (Signed)
Spoke with patient's wife in regards to telephone appointment on 07/15/20 @ 8:30am. Wife verbalized understanding of appointment date and time. Reviewed meaningful use questions. TM

## 2020-07-06 ENCOUNTER — Inpatient Hospital Stay: Payer: Medicare Other

## 2020-07-06 ENCOUNTER — Encounter: Payer: Self-pay | Admitting: *Deleted

## 2020-07-06 ENCOUNTER — Inpatient Hospital Stay (HOSPITAL_BASED_OUTPATIENT_CLINIC_OR_DEPARTMENT_OTHER): Payer: Medicare Other | Admitting: Hematology & Oncology

## 2020-07-06 ENCOUNTER — Encounter: Payer: Self-pay | Admitting: Hematology & Oncology

## 2020-07-06 ENCOUNTER — Other Ambulatory Visit: Payer: Self-pay

## 2020-07-06 ENCOUNTER — Telehealth: Payer: Self-pay

## 2020-07-06 VITALS — Wt 175.0 lb

## 2020-07-06 DIAGNOSIS — R634 Abnormal weight loss: Secondary | ICD-10-CM | POA: Diagnosis not present

## 2020-07-06 DIAGNOSIS — C787 Secondary malignant neoplasm of liver and intrahepatic bile duct: Secondary | ICD-10-CM | POA: Diagnosis not present

## 2020-07-06 DIAGNOSIS — Z5111 Encounter for antineoplastic chemotherapy: Secondary | ICD-10-CM | POA: Diagnosis not present

## 2020-07-06 DIAGNOSIS — C4372 Malignant melanoma of left lower limb, including hip: Secondary | ICD-10-CM | POA: Diagnosis not present

## 2020-07-06 DIAGNOSIS — C3431 Malignant neoplasm of lower lobe, right bronchus or lung: Secondary | ICD-10-CM | POA: Diagnosis not present

## 2020-07-06 DIAGNOSIS — C3490 Malignant neoplasm of unspecified part of unspecified bronchus or lung: Secondary | ICD-10-CM

## 2020-07-06 DIAGNOSIS — C7931 Secondary malignant neoplasm of brain: Secondary | ICD-10-CM | POA: Diagnosis not present

## 2020-07-06 LAB — CBC WITH DIFFERENTIAL (CANCER CENTER ONLY)
Abs Immature Granulocytes: 0.03 10*3/uL (ref 0.00–0.07)
Basophils Absolute: 0 10*3/uL (ref 0.0–0.1)
Basophils Relative: 0 %
Eosinophils Absolute: 0.1 10*3/uL (ref 0.0–0.5)
Eosinophils Relative: 4 %
HCT: 21.6 % — ABNORMAL LOW (ref 39.0–52.0)
Hemoglobin: 7.6 g/dL — ABNORMAL LOW (ref 13.0–17.0)
Immature Granulocytes: 1 %
Lymphocytes Relative: 48 %
Lymphs Abs: 1.1 10*3/uL (ref 0.7–4.0)
MCH: 33 pg (ref 26.0–34.0)
MCHC: 35.2 g/dL (ref 30.0–36.0)
MCV: 93.9 fL (ref 80.0–100.0)
Monocytes Absolute: 0.4 10*3/uL (ref 0.1–1.0)
Monocytes Relative: 16 %
Neutro Abs: 0.7 10*3/uL — ABNORMAL LOW (ref 1.7–7.7)
Neutrophils Relative %: 31 %
Platelet Count: 50 10*3/uL — ABNORMAL LOW (ref 150–400)
RBC: 2.3 MIL/uL — ABNORMAL LOW (ref 4.22–5.81)
RDW: 18.7 % — ABNORMAL HIGH (ref 11.5–15.5)
WBC Count: 2.3 10*3/uL — ABNORMAL LOW (ref 4.0–10.5)
nRBC: 0 % (ref 0.0–0.2)

## 2020-07-06 LAB — CMP (CANCER CENTER ONLY)
ALT: 9 U/L (ref 0–44)
AST: 13 U/L — ABNORMAL LOW (ref 15–41)
Albumin: 4 g/dL (ref 3.5–5.0)
Alkaline Phosphatase: 65 U/L (ref 38–126)
Anion gap: 9 (ref 5–15)
BUN: 17 mg/dL (ref 8–23)
CO2: 27 mmol/L (ref 22–32)
Calcium: 9.6 mg/dL (ref 8.9–10.3)
Chloride: 99 mmol/L (ref 98–111)
Creatinine: 1.4 mg/dL — ABNORMAL HIGH (ref 0.61–1.24)
GFR, Estimated: 54 mL/min — ABNORMAL LOW (ref >60)
Glucose, Bld: 114 mg/dL — ABNORMAL HIGH (ref 70–99)
Potassium: 3.8 mmol/L (ref 3.5–5.1)
Sodium: 135 mmol/L (ref 135–145)
Total Bilirubin: 0.4 mg/dL (ref 0.3–1.2)
Total Protein: 6.4 g/dL — ABNORMAL LOW (ref 6.5–8.1)

## 2020-07-06 LAB — LACTATE DEHYDROGENASE: LDH: 137 U/L (ref 98–192)

## 2020-07-06 MED ORDER — SODIUM CHLORIDE 0.9 % IV SOLN
INTRAVENOUS | Status: DC
  Filled 2020-07-06 (×2): qty 250

## 2020-07-06 NOTE — Progress Notes (Signed)
Visited with patient in infusion. His treatment has been cancelled for today due to low counts. He will return on Monday. He wants to make sure this doesn't impact his MRI which is scheduled for Friday. Spoke to Dr Marin Olp and confirmed that he needs to follow through with his scheduled MRI.  Later received a call from [place], patient's wife. Reviewed with her the reason for delay. Also instructed her to keep MRI appointment for Friday.   Oncology Nurse Navigator Documentation  Oncology Nurse Navigator Flowsheets 07/06/2020  Abnormal Finding Date -  Confirmed Diagnosis Date -  Diagnosis Status -  Planned Course of Treatment -  Phase of Treatment -  Chemotherapy Actual Start Date: -  Radiation Actual Start Date: -  Navigator Follow Up Date: 07/10/2020  Navigator Follow Up Reason: Scan Review  Navigator Location [facility]  Navigator Encounter Type Treatment;Telephone  Telephone Incoming Call;Patient Update  Treatment Initiated Date -  Patient Visit Type MedOnc  Treatment Phase Active Tx  Barriers/Navigation Needs Coordination of Care;Education  Education Other  Interventions Education;Psycho-Social Support  Acuity Level 2-Minimal Needs (1-2 Barriers Identified)  Referrals -  Coordination of Care -  Education Method Verbal  Support Groups/Services Friends and Family  Time Spent with Patient 30

## 2020-07-06 NOTE — Patient Instructions (Signed)

## 2020-07-06 NOTE — Patient Instructions (Signed)
Dehydration, Adult Dehydration is condition in which there is not enough water or other fluids in the body. This happens when a person loses more fluids than he or she takes in. Important body parts cannot work right without the right amount of fluids. Any loss of fluids from the body can cause dehydration. Dehydration can be mild, worse, or very bad. It should be treated right away to keep it from getting very bad. What are the causes? This condition may be caused by:  Conditions that cause loss of water or other fluids, such as: ? Watery poop (diarrhea). ? Vomiting. ? Sweating a lot. ? Peeing (urinating) a lot.  Not drinking enough fluids, especially when you: ? Are ill. ? Are doing things that take a lot of energy to do.  Other illnesses and conditions, such as fever or infection.  Certain medicines, such as medicines that take extra fluid out of the body (diuretics).  Lack of safe drinking water.  Not being able to get enough water and food. What increases the risk? The following factors may make you more likely to develop this condition:  Having a long-term (chronic) illness that has not been treated the right way, such as: ? Diabetes. ? Heart disease. ? Kidney disease.  Being 65 years of age or older.  Having a disability.  Living in a place that is high above the ground or sea (high in altitude). The thinner, dried air causes more fluid loss.  Doing exercises that put stress on your body for a long time. What are the signs or symptoms? Symptoms of dehydration depend on how bad it is. Mild or worse dehydration  Thirst.  Dry lips or dry mouth.  Feeling dizzy or light-headed, especially when you stand up from sitting.  Muscle cramps.  Your body making: ? Dark pee (urine). Pee may be the color of tea. ? Less pee than normal. ? Less tears than normal.  Headache. Very bad dehydration  Changes in skin. Skin may: ? Be cold to the touch (clammy). ? Be blotchy  or pale. ? Not go back to normal right after you lightly pinch it and let it go.  Little or no tears, pee, or sweat.  Changes in vital signs, such as: ? Fast breathing. ? Low blood pressure. ? Weak pulse. ? Pulse that is more than 100 beats a minute when you are sitting still.  Other changes, such as: ? Feeling very thirsty. ? Eyes that look hollow (sunken). ? Cold hands and feet. ? Being mixed up (confused). ? Being very tired (lethargic) or having trouble waking from sleep. ? Short-term weight loss. ? Loss of consciousness. How is this treated? Treatment for this condition depends on how bad it is. Treatment should start right away. Do not wait until your condition gets very bad. Very bad dehydration is an emergency. You will need to go to a hospital.  Mild or worse dehydration can be treated at home. You may be asked to: ? Drink more fluids. ? Drink an oral rehydration solution (ORS). This drink helps get the right amounts of fluids and salts and minerals in the blood (electrolytes).  Very bad dehydration can be treated: ? With fluids through an IV tube. ? By getting normal levels of salts and minerals in your blood. This is often done by giving salts and minerals through a tube. The tube is passed through your nose and into your stomach. ? By treating the root cause. Follow these instructions at   home: Oral rehydration solution If told by your doctor, drink an ORS:  Make an ORS. Use instructions on the package.  Start by drinking small amounts, about  cup (120 mL) every 5-10 minutes.  Slowly drink more until you have had the amount that your doctor said to have. Eating and drinking  Drink enough clear fluid to keep your pee pale yellow. If you were told to drink an ORS, finish the ORS first. Then, start slowly drinking other clear fluids. Drink fluids such as: ? Water. Do not drink only water. Doing that can make the salt (sodium) level in your body get too low. ? Water  from ice chips you suck on. ? Fruit juice that you have added water to (diluted). ? Low-calorie sports drinks.  Eat foods that have the right amounts of salts and minerals, such as: ? Bananas. ? Oranges. ? Potatoes. ? Tomatoes. ? Spinach.  Do not drink alcohol.  Avoid: ? Drinks that have a lot of sugar. These include:  High-calorie sports drinks.  Fruit juice that you did not add water to.  Soda.  Caffeine. ? Foods that are greasy or have a lot of fat or sugar.         General instructions  Take over-the-counter and prescription medicines only as told by your doctor.  Do not take salt tablets. Doing that can make the salt level in your body get too high.  Return to your normal activities as told by your doctor. Ask your doctor what activities are safe for you.  Keep all follow-up visits as told by your doctor. This is important. Contact a doctor if:  You have pain in your belly (abdomen) and the pain: ? Gets worse. ? Stays in one place.  You have a rash.  You have a stiff neck.  You get angry or annoyed (irritable) more easily than normal.  You are more tired or have a harder time waking than normal.  You feel: ? Weak or dizzy. ? Very thirsty. Get help right away if you have:  Any symptoms of very bad dehydration.  Symptoms of vomiting, such as: ? You cannot eat or drink without vomiting. ? Your vomiting gets worse or does not go away. ? Your vomit has blood or green stuff in it.  Symptoms that get worse with treatment.  A fever.  A very bad headache.  Problems with peeing or pooping (having a bowel movement), such as: ? Watery poop that gets worse or does not go away. ? Blood in your poop (stool). This may cause poop to look black and tarry. ? Not peeing in 6-8 hours. ? Peeing only a small amount of very dark pee in 6-8 hours.  Trouble breathing. These symptoms may be an emergency. Do not wait to see if the symptoms will go away. Get  medical help right away. Call your local emergency services (911 in the [place] Do not drive yourself to the hospital. Summary  Dehydration is a condition in which there is not enough water or other fluids in the body. This happens when a person loses more fluids than he or she takes in.  Treatment for this condition depends on how bad it is. Treatment should be started right away. Do not wait until your condition gets very bad.  Drink enough clear fluid to keep your pee pale yellow. If you were told to drink an oral rehydration solution (ORS), finish the ORS first. Then, start slowly drinking other clear fluids.    Take over-the-counter and prescription medicines only as told by your doctor.  Get help right away if you have any symptoms of very bad dehydration. This information is not intended to replace advice given to you by your health care provider. Make sure you discuss any questions you have with your health care provider. Document Revised: 01/03/2019 Document Reviewed: 01/03/2019 Elsevier Patient Education  2021 [employer]  

## 2020-07-06 NOTE — Progress Notes (Signed)
Hematology and Oncology Follow Up Visit  Daivik Overley [id number] 1951/04/17 70 y.o. 07/06/2020   Principle Diagnosis:  Stage IB ([id number]) superficial spreading melanoma of the left lower leg  Small Cell Lung Cancer -- Extensive Stage -- lung/liver/brain mets  Past Therapy: Cranial XRT -- 06/19/2019 thru 07/02/2019  Current Therapy:        Carboplatin/VP-16/Tecentriq -- started on 07/07/2018, s/p cycle #4 Tecentriq - maintenance -- cycle#1 -- start on 10/01/2019 - d/c on 10/21/2019 Zepzelca/Keytruda -- q 3 wk --s/p cycle #6 - started on 10/28/2019 -- d/c on 03/15/2020 CDDP/Irinotecan -- s/p cycle #4 -- start on 03/23/2020   Interim History:  Mr. Dullea is here today for follow-up.  Unfortunately, his blood counts are still on the low side.  Have to believe that it is the second week that he gets irinotecan that is the problem.  He feels good.  He has had no problems with nausea or vomiting.  He is not taking the Marinol.  I told him that he really needs to take Marinol to try to help with his appetite.  This also likely would help his blood counts.  He has had no bleeding.  He has had no headache.  He goes for an MRI of the brain this Friday.  He has had no leg swelling.  He really does not feel that tired.  His hemoglobin is only 7.6.  We will hold off on transfusing him.  Overall, I would have to say his performance status is probably ECOG 1.    Medications:  Allergies as of 07/06/2020   No Known Allergies     Medication List       Accurate as of July 06, 2020 10:41 AM. If you have any questions, ask your nurse or doctor.        acetaminophen 500 MG tablet Commonly known as: TYLENOL Take 1,000 mg by mouth every 6 (six) hours as needed for moderate pain.   ALPRAZolam 0.5 MG tablet Commonly known as: XANAX Take 1 tablet (0.5 mg total) by mouth every 6 (six) hours as needed for anxiety.   cholestyramine 4 g packet Commonly known as: Questran Take 1 packet (4 g  total) by mouth 3 (three) times daily with meals.   dexamethasone 4 MG tablet Commonly known as: DECADRON Take 2 tablets (8 mg total) by mouth daily. Take daily x 3 days starting the day after cisplatin chemotherapy. Take with food.   diphenoxylate-atropine 2.5-0.025 MG tablet Commonly known as: LOMOTIL Take 2 tablets at onset of diarrhea. Take one tablet after each loose stool. Max 8 tablets per day.   dronabinol 5 MG capsule Commonly known as: MARINOL Take 1 capsule (5 mg total) by mouth 2 (two) times daily before a meal.   finasteride 5 MG tablet Commonly known as: PROSCAR TAKE 1 TABLET BY MOUTH EVERY DAY   furosemide 20 MG tablet Commonly known as: LASIX Take 2 tablets (40 mg total) by mouth daily.   lidocaine-prilocaine cream Commonly known as: EMLA Apply to affected area once   loperamide 2 MG tablet Commonly known as: IMODIUM A-D Take 2 at diarrhea onset, then 1 every 2hr until 12hrs with no BM. May take 2 every 4hrs at night. If diarrhea recurs repeat.   LORazepam 0.5 MG tablet Commonly known as: Ativan Take 1 tablet (0.5 mg total) by mouth every 6 (six) hours as needed (Nausea or vomiting).   memantine 10 MG tablet Commonly known as: NAMENDA Take 10 mg by mouth daily.  multivitamin capsule Take 1 capsule by mouth daily.   nicotine 21 mg/24hr patch Commonly known as: NICODERM CQ - dosed in mg/24 hours PLACE 1 PATCH (21 MG TOTAL) ONTO THE SKIN DAILY.   omeprazole 40 MG capsule Commonly known as: PRILOSEC Take 1 capsule (40 mg total) by mouth 2 (two) times daily.   ondansetron 4 MG tablet Commonly known as: ZOFRAN Take 1 tablet (4 mg total) by mouth every 6 (six) hours as needed for nausea.   ondansetron 8 MG tablet Commonly known as: Zofran Take 1 tablet (8 mg total) by mouth 2 (two) times daily as needed. Start on the third day after cisplatin chemotherapy.   ondansetron 8 MG disintegrating tablet Commonly known as: ZOFRAN-ODT Take 8 mg by mouth.    OPCON-A OP Place 2 drops into both eyes as needed (for dry eyes).   potassium chloride SA 20 MEQ tablet Commonly known as: KLOR-CON Take 1 tablet (20 mEq total) by mouth daily.   prochlorperazine 10 MG tablet Commonly known as: COMPAZINE Take 1 tablet (10 mg total) by mouth every 6 (six) hours as needed (Nausea or vomiting).   tamsulosin 0.4 MG Caps capsule Commonly known as: FLOMAX Take 1 capsule (0.4 mg total) by mouth daily.       Allergies: No Known Allergies  Past Medical History, Surgical history, Social history, and Family History were reviewed and updated.  Review of Systems: Review of Systems  Constitutional: Positive for weight loss.  HENT: Negative.   Eyes: Negative.   Respiratory: Negative.   Cardiovascular: Negative.   Gastrointestinal: Negative.   Genitourinary: Negative.   Musculoskeletal: Negative.   Skin: Negative.   Neurological: Negative.   Endo/Heme/Allergies: Negative.   Psychiatric/Behavioral: Negative.      Physical Exam:  weight is 175 lb (79.4 kg).   Wt Readings from Last 3 Encounters:  07/06/20 175 lb (79.4 kg)  06/01/20 157 lb (71.2 kg)  05/11/20 155 lb (70.3 kg)    Physical Exam Vitals reviewed.  HENT:     Head: Normocephalic and atraumatic.  Eyes:     Pupils: Pupils are equal, round, and reactive to light.  Cardiovascular:     Rate and Rhythm: Normal rate and regular rhythm.     Heart sounds: Normal heart sounds.  Pulmonary:     Effort: Pulmonary effort is normal.     Breath sounds: Normal breath sounds.  Abdominal:     General: Bowel sounds are normal.     Palpations: Abdomen is soft.  Musculoskeletal:        General: No tenderness or deformity. Normal range of motion.     Cervical back: Normal range of motion.  Lymphadenopathy:     Cervical: No cervical adenopathy.  Skin:    General: Skin is warm and dry.     Findings: No erythema or rash.  Neurological:     Mental Status: He is alert and oriented to person,  place, and time.  Psychiatric:        Behavior: Behavior normal.        Thought Content: Thought content normal.        Judgment: Judgment normal.      Lab Results  Component Value Date   WBC 2.3 (L) 07/06/2020   HGB 7.6 (L) 07/06/2020   HCT 21.6 (L) 07/06/2020   MCV 93.9 07/06/2020   PLT 50 (L) 07/06/2020   Lab Results  Component Value Date   FERRITIN 2,549 (H) 10/21/2019   IRON 43 10/21/2019  TIBC 225 10/21/2019   UIBC 182 10/21/2019   IRONPCTSAT 19 (L) 10/21/2019   Lab Results  Component Value Date   RETICCTPCT 1.5 08/07/2019   RBC 2.30 (L) 07/06/2020   No results found for: KPAFRELGTCHN, LAMBDASER, KAPLAMBRATIO No results found for: IGGSERUM, IGA, IGMSERUM No results found for: Kathrynn Ducking, MSPIKE, SPEI   Chemistry      Component Value Date/Time   NA 135 07/06/2020 0800   NA 142 01/30/2017 0804   K 3.8 07/06/2020 0800   K 4.1 01/30/2017 0804   CL 99 07/06/2020 0800   CL 105 01/25/2016 0926   CO2 27 07/06/2020 0800   CO2 29 01/30/2017 0804   BUN 17 07/06/2020 0800   BUN 6.9 (L) 01/30/2017 0804   CREATININE 1.40 (H) 07/06/2020 0800   CREATININE 0.8 01/30/2017 0804      Component Value Date/Time   CALCIUM 9.6 07/06/2020 0800   CALCIUM 10.5 (H) 01/30/2017 0804   ALKPHOS 65 07/06/2020 0800   ALKPHOS 79 01/30/2017 0804   AST 13 (L) 07/06/2020 0800   AST 34 01/30/2017 0804   ALT 9 07/06/2020 0800   ALT 18 01/30/2017 0804   BILITOT 0.4 07/06/2020 0800   BILITOT 1.49 (H) 01/30/2017 0804       Impression and Plan: Mr. Urey is a very pleasant 70 yo caucasian gentleman with history of stage 1b melanoma of the left lower leg.   This really is not a problem right now.  He now has extensive stage small cell lung cancer.  Again, he is not going to receive chemotherapy today.  We will just move everything back 1 week.  I am going to delete the day 8 of irinotecan.  Hopefully this will help with his blood counts,  particularly his platelets.  We will try for his fifth cycle of treatment next week.  I will see him back in February.  After his sixth cycle we will then repeat our scans.   Volanda Napoleon, MD 1/31/202210:41 AM

## 2020-07-06 NOTE — Telephone Encounter (Signed)
appts made for pt per 07/06/20 los and pt to rec. appts in tx/avs    Walter Hernandez

## 2020-07-10 ENCOUNTER — Other Ambulatory Visit: Payer: Self-pay

## 2020-07-10 ENCOUNTER — Encounter: Payer: Self-pay | Admitting: *Deleted

## 2020-07-10 ENCOUNTER — Ambulatory Visit (HOSPITAL_COMMUNITY)
Admission: RE | Admit: 2020-07-10 | Discharge: 2020-07-10 | Disposition: A | Discharge status: Home / Self Care | Payer: Medicare Other | Source: Ambulatory Visit | Attending: Radiation Oncology | Admitting: Radiation Oncology

## 2020-07-10 ENCOUNTER — Telehealth: Payer: Self-pay | Admitting: Radiation Oncology

## 2020-07-10 DIAGNOSIS — C7931 Secondary malignant neoplasm of brain: Secondary | ICD-10-CM | POA: Diagnosis not present

## 2020-07-10 DIAGNOSIS — H748X3 Other specified disorders of middle ear and mastoid, bilateral: Secondary | ICD-10-CM | POA: Diagnosis not present

## 2020-07-10 DIAGNOSIS — C799 Secondary malignant neoplasm of unspecified site: Secondary | ICD-10-CM | POA: Diagnosis not present

## 2020-07-10 DIAGNOSIS — C719 Malignant neoplasm of brain, unspecified: Secondary | ICD-10-CM | POA: Diagnosis not present

## 2020-07-10 DIAGNOSIS — G9389 Other specified disorders of brain: Secondary | ICD-10-CM | POA: Diagnosis not present

## 2020-07-10 IMAGING — MR MR HEAD WO/W CM
10 of 13 series · 18 of 48 positions shown · IV contrast (gadavist)
Comparison: MRI of the brain [DATE].

CLINICAL DATA: Brain/CNS neoplasm.  Surveillance.

EXAM:
MRI HEAD WITHOUT AND WITH CONTRAST
TECHNIQUE: Multiplanar, multiecho pulse sequences of the brain and surrounding
structures were obtained without and with intravenous contrast.
CONTRAST:  7.5mL GADAVIST GADOBUTROL 1 MMOL/ML IV SOLN

[Series 2: FLAIR · sagittal · 3.0mm · 0.47mm/px · 1 of 37 slices shown (1 of 2)]
[im 1/37]
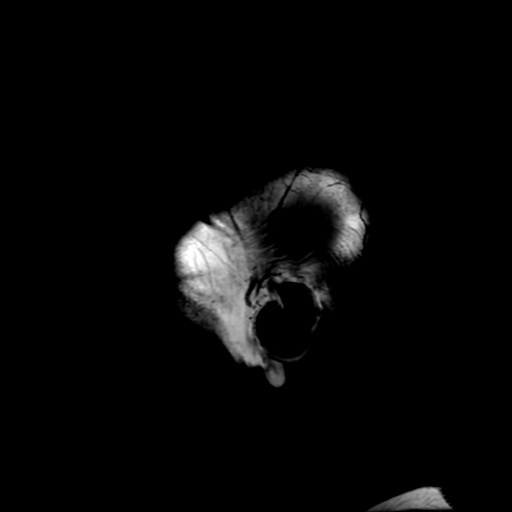

[Series 3: DWI · axial · 3.0mm · 0.94mm/px · z∈[-12,+136]mm · 2 of 102 slices shown]
[im 1/102]
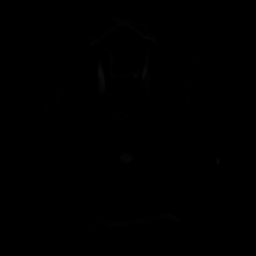
[im 102/102]
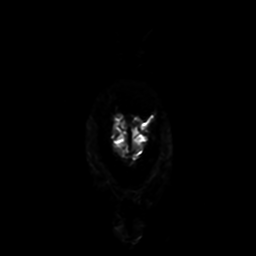

[Series 4: FLAIR · axial · 3.0mm · 0.47mm/px · 1 of 58 slices shown (2 of 2)]
[im 1/58]
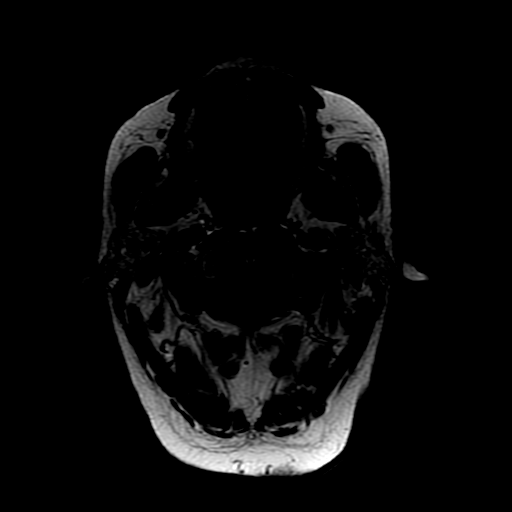

[Series 5: SWI · axial · 3.0mm · 0.47mm/px · 1 of 108 slices shown]
[im 1/108]
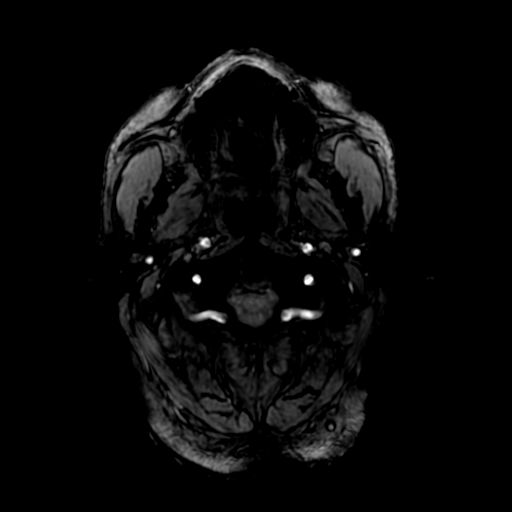

[Series 7: T2 post-contrast · coronal · 3.0mm · 0.39mm/px · 1 of 45 slices shown]
[im 1/45]
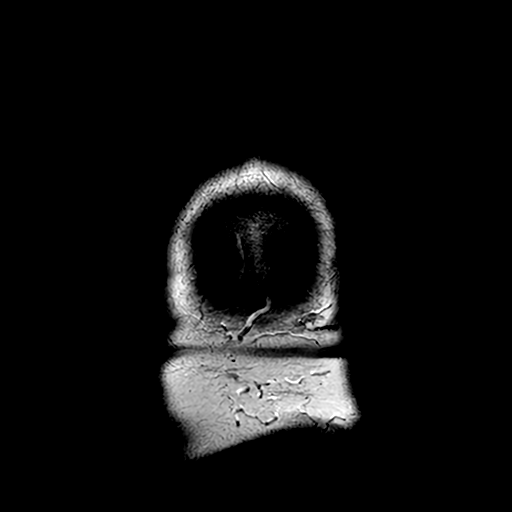

[Series 8: T2 · axial · 5.0mm · 0.23mm/px · 1 of 26 slices shown]
[im 1/26]
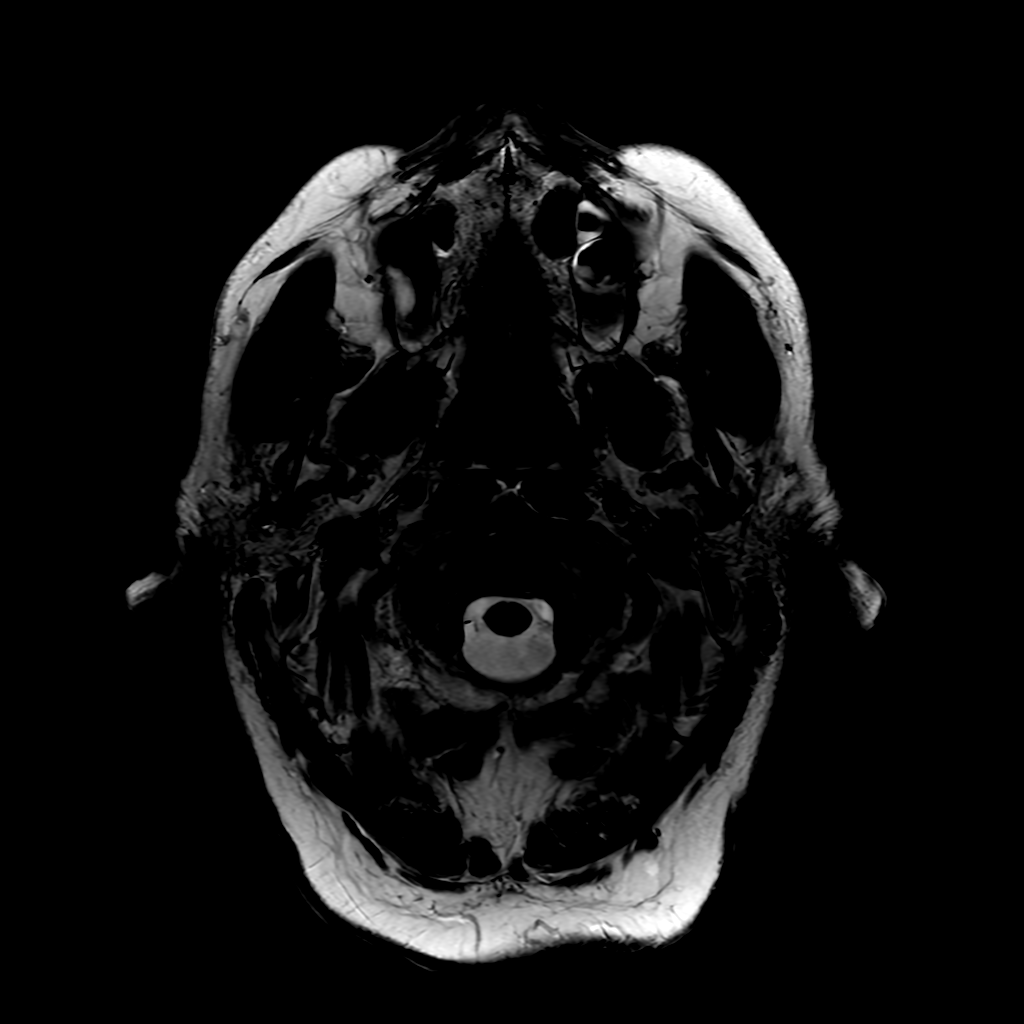

[Series 9: T1 post-contrast · coronal · 3.0mm · 0.43mm/px · 1 of 45 slices shown (1 of 2)]
[im 1/45]
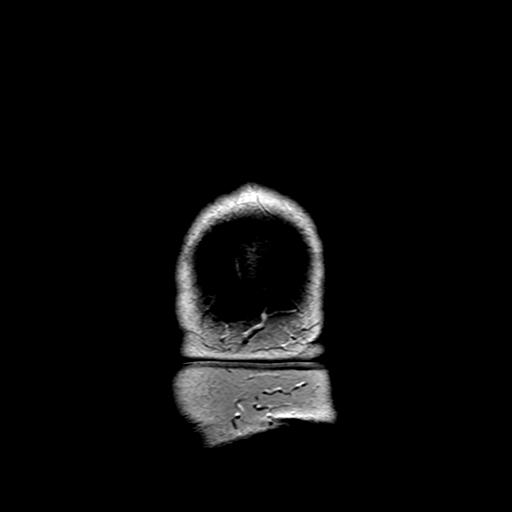

[Series 10: FLAIR post-contrast · sagittal · 3.0mm · 0.47mm/px · 1 of 37 slices shown]
[im 1/37]
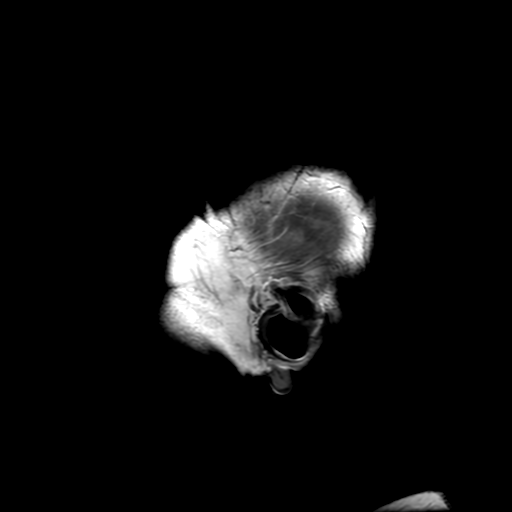

[Series 350: ADC · axial · 3.0mm · 0.94mm/px · 1 of 51 slices shown]
[im 1/51]
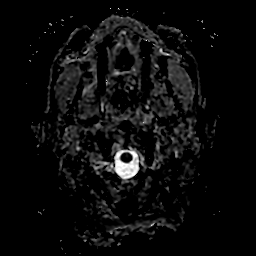

[Series 1100: T1 post-contrast · axial · 0.9mm · 0.50mm/px · z∈[-106,+150]mm · 8 of 302 slices shown (2 of 2)]
[im 1/302]
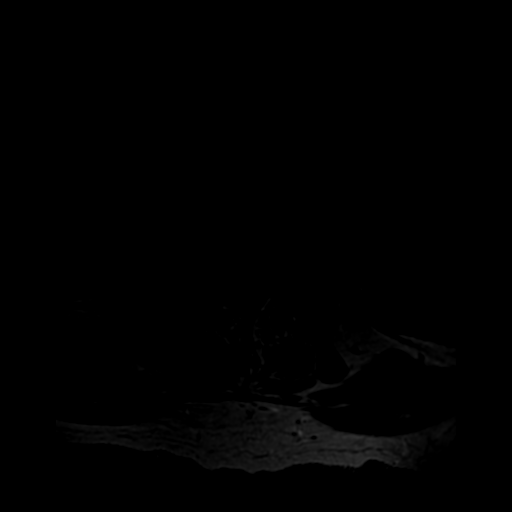
[im 44/302]
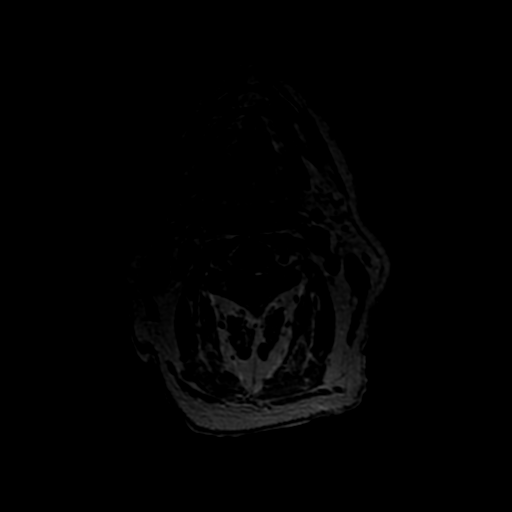
[im 87/302]
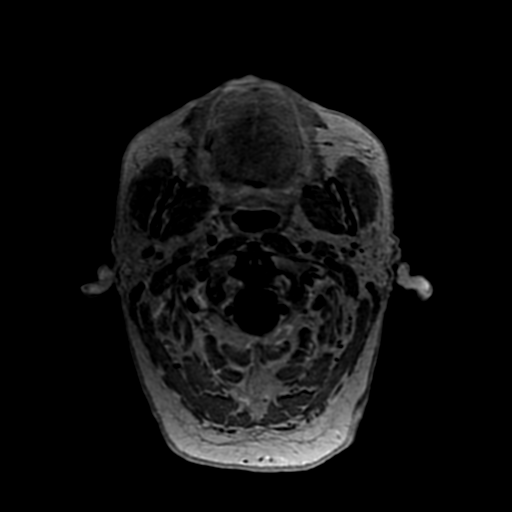
[im 130/302]
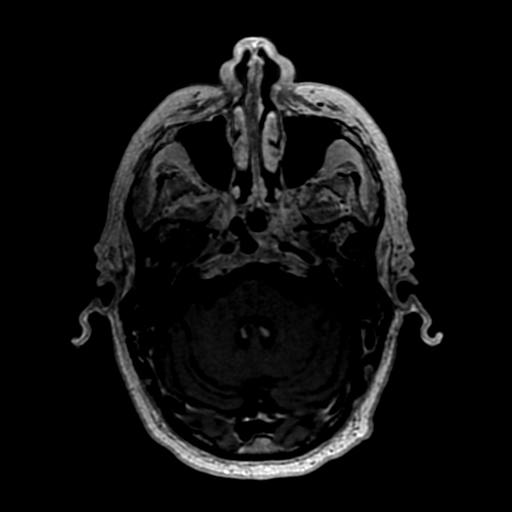
[im 173/302]
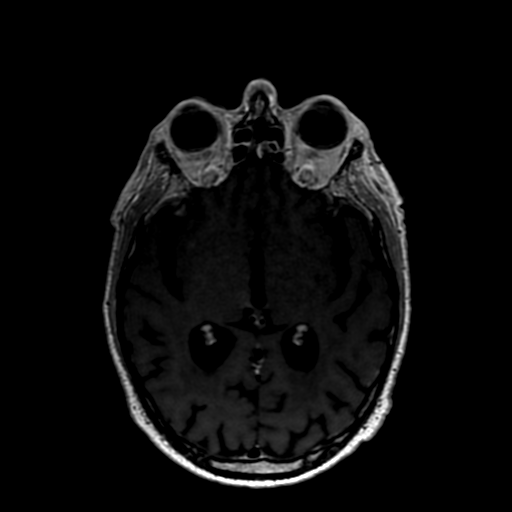
[im 216/302]
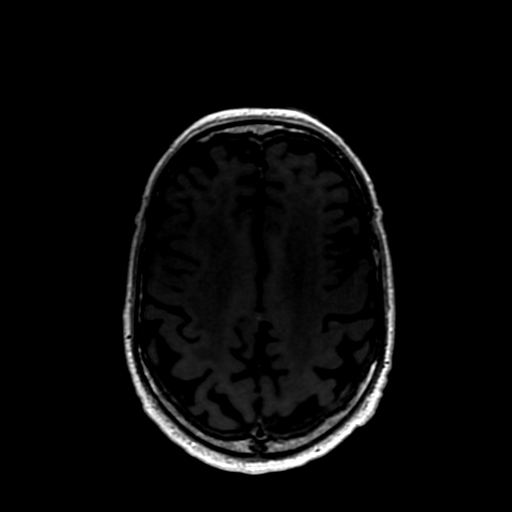
[im 259/302]
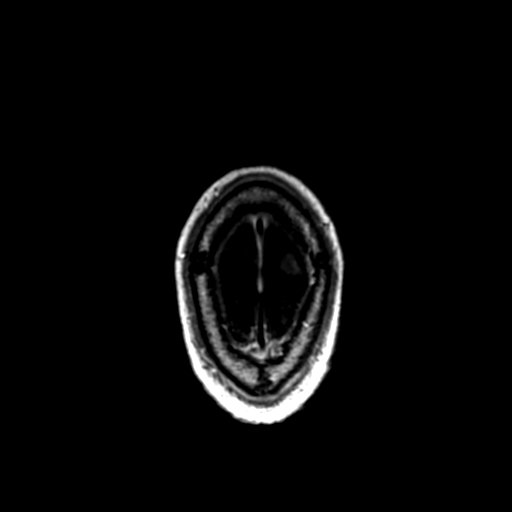
[im 302/302]
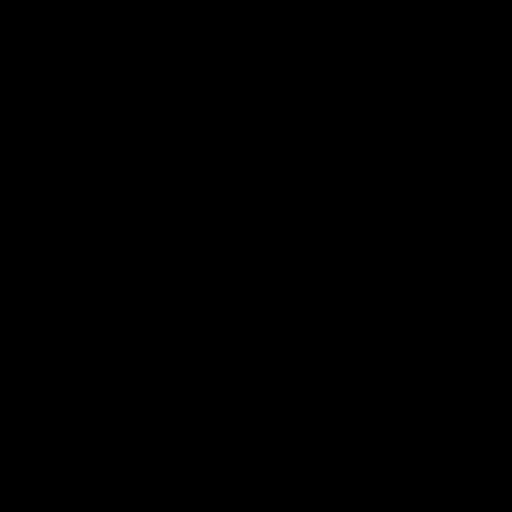

[18 of 48 positions shown; findings below may reference images not displayed]

FINDINGS: Brain: Enhancing lesions labeled on postcontrast axial T1 images
(series [OO]):

Larger lesions: Left superior frontal gyrus, 6 mm, image 254; patchy
enhancing lesion in the right pre frontal gyrus, 7-8 mm, image 232.

New lesions:

Right frontal lobe, 12 mm, image 210;

Medial right thalamus, 10 mm, image 176;

Midbrain on the right side, 6 mm, image 169;

Left temporal lobe, 12 mm, image 170;

Left occipital lobe, 1 mm, image 153;

Left cerebellar hemisphere, 2 mm, image 126.

Confluent T2 hyperintensity within the white matter of the cerebral
hemispheres most consistent with posttreatment changes appear
stable. Mild T2 hyperintensity within the right side of the pons is
also unchanged.

No evidence of an acute infarct, hemorrhage or extra-axial fluid
collection.

Vascular: Normal flow voids.

Skull and upper cervical spine: Normal marrow signal.

Sinuses/Orbits: Trace mucosal thickening throughout the paranasal
sinuses. The orbits are maintained.

Other: Bilateral mastoid effusion.
IMPRESSION: 1. Progression of metastatic disease with 6 new enhancing lesions,
as above.
2. Stable posttreatment changes within the white matter of the
cerebral hemispheres and right side of the pons.
3. Bilateral mastoid effusion.

## 2020-07-10 MED ORDER — GADOBUTROL 1 MMOL/ML IV SOLN
7.5000 mL | Freq: Once | INTRAVENOUS | Status: AC | PRN
  Administered 2020-07-10: 7.5 mL via INTRAVENOUS

## 2020-07-10 NOTE — Telephone Encounter (Addendum)
Received voicemail message from patient's wife, Walter Hernandez. She tearfully requested a call back because her husband's MRI results showed up on MyChart revealing six new brain mets. Phoned Walter Hernandez back. Attempted to reassure her. Explained that Dr. Tammi Klippel has reviewed his imaging and plans to mirror the new MRI with the old treatment plan. Explained that per Dr. Tammi Klippel he suspects that [facility] will be used to treat these lesions (just like the ones before). Explained that all the imaging will be reviewed on Monday during brain conference and [place] will call Wednesday to confirm the plan. Walter Hernandez verbalized understanding of all reviewed and expressed some relief.

## 2020-07-10 NOTE — Progress Notes (Signed)
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

## 2020-07-13 ENCOUNTER — Inpatient Hospital Stay: Payer: Medicare Other

## 2020-07-13 ENCOUNTER — Other Ambulatory Visit: Payer: Self-pay | Admitting: *Deleted

## 2020-07-13 ENCOUNTER — Inpatient Hospital Stay: Payer: Medicare Other | Attending: Hematology & Oncology

## 2020-07-13 ENCOUNTER — Other Ambulatory Visit: Payer: Self-pay

## 2020-07-13 VITALS — BP 125/73 | HR 86 | Temp 98.1°F | Resp 17

## 2020-07-13 DIAGNOSIS — C787 Secondary malignant neoplasm of liver and intrahepatic bile duct: Secondary | ICD-10-CM | POA: Diagnosis not present

## 2020-07-13 DIAGNOSIS — R634 Abnormal weight loss: Secondary | ICD-10-CM | POA: Diagnosis not present

## 2020-07-13 DIAGNOSIS — C7931 Secondary malignant neoplasm of brain: Secondary | ICD-10-CM | POA: Insufficient documentation

## 2020-07-13 DIAGNOSIS — C4372 Malignant melanoma of left lower limb, including hip: Secondary | ICD-10-CM

## 2020-07-13 DIAGNOSIS — C3431 Malignant neoplasm of lower lobe, right bronchus or lung: Secondary | ICD-10-CM | POA: Insufficient documentation

## 2020-07-13 DIAGNOSIS — D649 Anemia, unspecified: Secondary | ICD-10-CM

## 2020-07-13 DIAGNOSIS — Z5111 Encounter for antineoplastic chemotherapy: Secondary | ICD-10-CM | POA: Diagnosis not present

## 2020-07-13 DIAGNOSIS — C3490 Malignant neoplasm of unspecified part of unspecified bronchus or lung: Secondary | ICD-10-CM

## 2020-07-13 DIAGNOSIS — Z79899 Other long term (current) drug therapy: Secondary | ICD-10-CM | POA: Diagnosis not present

## 2020-07-13 LAB — CBC WITH DIFFERENTIAL (CANCER CENTER ONLY)
Abs Immature Granulocytes: 0.02 10*3/uL (ref 0.00–0.07)
Basophils Absolute: 0 10*3/uL (ref 0.0–0.1)
Basophils Relative: 0 %
Eosinophils Absolute: 0 10*3/uL (ref 0.0–0.5)
Eosinophils Relative: 1 %
HCT: 22.4 % — ABNORMAL LOW (ref 39.0–52.0)
Hemoglobin: 7.8 g/dL — ABNORMAL LOW (ref 13.0–17.0)
Immature Granulocytes: 1 %
Lymphocytes Relative: 31 %
Lymphs Abs: 1.2 10*3/uL (ref 0.7–4.0)
MCH: 33.2 pg (ref 26.0–34.0)
MCHC: 34.8 g/dL (ref 30.0–36.0)
MCV: 95.3 fL (ref 80.0–100.0)
Monocytes Absolute: 0.6 10*3/uL (ref 0.1–1.0)
Monocytes Relative: 15 %
Neutro Abs: 2.1 10*3/uL (ref 1.7–7.7)
Neutrophils Relative %: 52 %
Platelet Count: 77 10*3/uL — ABNORMAL LOW (ref 150–400)
RBC: 2.35 MIL/uL — ABNORMAL LOW (ref 4.22–5.81)
RDW: 20.7 % — ABNORMAL HIGH (ref 11.5–15.5)
WBC Count: 3.9 10*3/uL — ABNORMAL LOW (ref 4.0–10.5)
nRBC: 0 % (ref 0.0–0.2)

## 2020-07-13 LAB — COMPREHENSIVE METABOLIC PANEL
ALT: 7 U/L (ref 0–44)
AST: 12 U/L — ABNORMAL LOW (ref 15–41)
Albumin: 4.2 g/dL (ref 3.5–5.0)
Alkaline Phosphatase: 71 U/L (ref 38–126)
Anion gap: 9 (ref 5–15)
BUN: 12 mg/dL (ref 8–23)
CO2: 26 mmol/L (ref 22–32)
Calcium: 9.7 mg/dL (ref 8.9–10.3)
Chloride: 100 mmol/L (ref 98–111)
Creatinine, Ser: 1.23 mg/dL (ref 0.61–1.24)
GFR, Estimated: >60 mL/min (ref >60)
Glucose, Bld: 102 mg/dL — ABNORMAL HIGH (ref 70–99)
Potassium: 3.9 mmol/L (ref 3.5–5.1)
Sodium: 135 mmol/L (ref 135–145)
Total Bilirubin: 0.6 mg/dL (ref 0.3–1.2)
Total Protein: 6.3 g/dL — ABNORMAL LOW (ref 6.5–8.1)

## 2020-07-13 LAB — SAMPLE TO BLOOD BANK

## 2020-07-13 LAB — PREPARE RBC (CROSSMATCH)

## 2020-07-13 MED ORDER — HEPARIN SOD (PORK) LOCK FLUSH 100 UNIT/ML IV SOLN
500.0000 [IU] | Freq: Once | INTRAVENOUS | Status: AC | PRN
  Administered 2020-07-13: 500 [IU]
  Filled 2020-07-13: qty 5

## 2020-07-13 MED ORDER — ATROPINE SULFATE 1 MG/ML IJ SOLN
0.5000 mg | Freq: Once | INTRAMUSCULAR | Status: AC | PRN
  Administered 2020-07-13: 0.5 mg via INTRAVENOUS

## 2020-07-13 MED ORDER — SODIUM CHLORIDE 0.9 % IV SOLN
60.0000 mg/m2 | Freq: Once | INTRAVENOUS | Status: AC
  Administered 2020-07-13: 120 mg via INTRAVENOUS
  Filled 2020-07-13: qty 6

## 2020-07-13 MED ORDER — ATROPINE SULFATE 1 MG/ML IJ SOLN
INTRAMUSCULAR | Status: AC
  Filled 2020-07-13: qty 1

## 2020-07-13 MED ORDER — SODIUM CHLORIDE 0.9% FLUSH
10.0000 mL | INTRAVENOUS | Status: DC | PRN
  Administered 2020-07-13: 10 mL
  Filled 2020-07-13: qty 10

## 2020-07-13 MED ORDER — MAGNESIUM SULFATE 2 GM/50ML IV SOLN
2.0000 g | Freq: Once | INTRAVENOUS | Status: AC
  Administered 2020-07-13: 2 g via INTRAVENOUS
  Filled 2020-07-13: qty 50

## 2020-07-13 MED ORDER — SODIUM CHLORIDE 0.9 % IV SOLN
Freq: Once | INTRAVENOUS | Status: AC
  Filled 2020-07-13: qty 250

## 2020-07-13 MED ORDER — PALONOSETRON HCL INJECTION 0.25 MG/5ML
0.2500 mg | Freq: Once | INTRAVENOUS | Status: AC
  Administered 2020-07-13: 0.25 mg via INTRAVENOUS

## 2020-07-13 MED ORDER — SODIUM CHLORIDE 0.9 % IV SOLN
10.0000 mg | Freq: Once | INTRAVENOUS | Status: AC
  Administered 2020-07-13: 10 mg via INTRAVENOUS
  Filled 2020-07-13: qty 10

## 2020-07-13 MED ORDER — SODIUM CHLORIDE 0.9 % IV SOLN
150.0000 mg | Freq: Once | INTRAVENOUS | Status: AC
  Administered 2020-07-13: 150 mg via INTRAVENOUS
  Filled 2020-07-13: qty 150

## 2020-07-13 MED ORDER — POTASSIUM CHLORIDE IN NACL 20-0.9 MEQ/L-% IV SOLN
Freq: Once | INTRAVENOUS | Status: AC
  Filled 2020-07-13: qty 1000

## 2020-07-13 MED ORDER — SODIUM CHLORIDE 0.9 % IV SOLN: Freq: Once | INTRAVENOUS | Status: DC

## 2020-07-13 MED ORDER — SODIUM CHLORIDE 0.9 % IV SOLN
30.0000 mg/m2 | Freq: Once | INTRAVENOUS | Status: AC
  Administered 2020-07-13: 56 mg via INTRAVENOUS
  Filled 2020-07-13: qty 56

## 2020-07-13 MED ORDER — PALONOSETRON HCL INJECTION 0.25 MG/5ML
INTRAVENOUS | Status: AC
  Filled 2020-07-13: qty 5

## 2020-07-13 NOTE — Patient Instructions (Signed)
Irinotecan injection What is this medicine? IRINOTECAN (ir in oh TEE kan ) is a chemotherapy drug. It is used to treat colon and rectal cancer. This medicine may be used for other purposes; ask your health care provider or pharmacist if you have questions. COMMON BRAND NAME(S): Camptosar What should I tell my health care provider before I take this medicine? They need to know if you have any of these conditions:  dehydration  diarrhea  infection (especially a virus infection such as chickenpox, cold sores, or herpes)  liver disease  low blood counts, like low white cell, platelet, or red cell counts  low levels of calcium, magnesium, or potassium in the blood  recent or ongoing radiation therapy  an unusual or allergic reaction to irinotecan, other medicines, foods, dyes, or preservatives  pregnant or trying to get pregnant  breast-feeding How should I use this medicine? This drug is given as an infusion into a vein. It is administered in a hospital or clinic by a specially trained health care professional. Talk to your pediatrician regarding the use of this medicine in children. Special care may be needed. Overdosage: If you think you have taken too much of this medicine contact a poison control center or emergency room at once. NOTE: This medicine is only for you. Do not share this medicine with others. What if I miss a dose? It is important not to miss your dose. Call your doctor or health care professional if you are unable to keep an appointment. What may interact with this medicine? Do not take this medicine with any of the following medications:  cobicistat  itraconazole This medicine may interact with the following medications:  antiviral medicines for HIV or AIDS  certain antibiotics like rifampin or rifabutin  certain medicines for fungal infections like ketoconazole, posaconazole, and voriconazole  certain medicines for seizures like carbamazepine,  phenobarbital, phenotoin  clarithromycin  gemfibrozil  nefazodone  St. John's Wort This list may not describe all possible interactions. Give your health care provider a list of all the medicines, herbs, non-prescription drugs, or dietary supplements you use. Also tell them if you smoke, drink alcohol, or use illegal drugs. Some items may interact with your medicine. What should I watch for while using this medicine? Your condition will be monitored carefully while you are receiving this medicine. You will need important blood work done while you are taking this medicine. This drug may make you feel generally unwell. This is not uncommon, as chemotherapy can affect healthy cells as well as cancer cells. Report any side effects. Continue your course of treatment even though you feel ill unless your doctor tells you to stop. In some cases, you may be given additional medicines to help with side effects. Follow all directions for their use. You may get drowsy or dizzy. Do not drive, use machinery, or do anything that needs mental alertness until you know how this medicine affects you. Do not stand or sit up quickly, especially if you are an older patient. This reduces the risk of dizzy or fainting spells. Call your health care professional for advice if you get a fever, chills, or sore throat, or other symptoms of a cold or flu. Do not treat yourself. This medicine decreases your body's ability to fight infections. Try to avoid being around people who are sick. Avoid taking products that contain aspirin, acetaminophen, ibuprofen, naproxen, or ketoprofen unless instructed by your doctor. These medicines may hide a fever. This medicine may increase your risk  to bruise or bleed. Call your doctor or health care professional if you notice any unusual bleeding. Be careful brushing and flossing your teeth or using a toothpick because you may get an infection or bleed more easily. If you have any dental work  done, tell your dentist you are receiving this medicine. Do not become pregnant while taking this medicine or for 6 months after stopping it. Women should inform their health care professional if they wish to become pregnant or think they might be pregnant. Men should not father a child while taking this medicine and for 3 months after stopping it. There is potential for serious side effects to an unborn child. Talk to your health care professional for more information. Do not breast-feed an infant while taking this medicine or for 7 days after stopping it. This medicine has caused ovarian failure in some women. This medicine may make it more difficult to get pregnant. Talk to your health care professional if you are concerned about your fertility. This medicine has caused decreased sperm counts in some men. This may make it more difficult to father a child. Talk to your health care professional if you are concerned about your fertility. What side effects may I notice from receiving this medicine? Side effects that you should report to your doctor or health care professional as soon as possible:  allergic reactions like skin rash, itching or hives, swelling of the face, lips, or tongue  chest pain  diarrhea  flushing, runny nose, sweating during infusion  low blood counts - this medicine may decrease the number of white blood cells, red blood cells and platelets. You may be at increased risk for infections and bleeding.  nausea, vomiting  pain, swelling, warmth in the leg  signs of decreased platelets or bleeding - bruising, pinpoint red spots on the skin, black, tarry stools, blood in the urine  signs of infection - fever or chills, cough, sore throat, pain or difficulty passing urine  signs of decreased red blood cells - unusually weak or tired, fainting spells, lightheadedness Side effects that usually do not require medical attention (report to your doctor or health care professional  if they continue or are bothersome):  constipation  hair loss  headache  loss of appetite  mouth sores  stomach pain This list may not describe all possible side effects. Call your doctor for medical advice about side effects. You may report side effects to FDA at 1-800-FDA-1088. Where should I keep my medicine? This drug is given in a hospital or clinic and will not be stored at home. NOTE: This sheet is a summary. It may not cover all possible information. If you have questions about this medicine, talk to your doctor, pharmacist, or health care provider.  2021 [employer]/Gold Standard (2019-04-23 17:46:13) Cisplatin injection What is this medicine? CISPLATIN (SIS pla tin) is a chemotherapy drug. It targets fast dividing cells, like cancer cells, and causes these cells to die. This medicine is used to treat many types of cancer like bladder, ovarian, and testicular cancers. This medicine may be used for other purposes; ask your health care provider or pharmacist if you have questions. COMMON BRAND NAME(S): Platinol, Platinol -AQ What should I tell my health care provider before I take this medicine? They need to know if you have any of these conditions:  eye disease, vision problems  hearing problems  kidney disease  low blood counts, like white cells, platelets, or red blood cells  tingling of the fingers  or toes, or other nerve disorder  an unusual or allergic reaction to cisplatin, carboplatin, oxaliplatin, other medicines, foods, dyes, or preservatives  pregnant or trying to get pregnant  breast-feeding How should I use this medicine? This drug is given as an infusion into a vein. It is administered in a hospital or clinic by a specially trained health care professional. Talk to your pediatrician regarding the use of this medicine in children. Special care may be needed. Overdosage: If you think you have taken too much of this medicine contact a poison control center  or emergency room at once. NOTE: This medicine is only for you. Do not share this medicine with others. What if I miss a dose? It is important not to miss a dose. Call your doctor or health care professional if you are unable to keep an appointment. What may interact with this medicine? This medicine may interact with the following medications:  foscarnet  certain antibiotics like amikacin, gentamicin, neomycin, polymyxin B, streptomycin, tobramycin, vancomycin This list may not describe all possible interactions. Give your health care provider a list of all the medicines, herbs, non-prescription drugs, or dietary supplements you use. Also tell them if you smoke, drink alcohol, or use illegal drugs. Some items may interact with your medicine. What should I watch for while using this medicine? Your condition will be monitored carefully while you are receiving this medicine. You will need important blood work done while you are taking this medicine. This drug may make you feel generally unwell. This is not uncommon, as chemotherapy can affect healthy cells as well as cancer cells. Report any side effects. Continue your course of treatment even though you feel ill unless your doctor tells you to stop. This medicine may increase your risk of getting an infection. Call your healthcare professional for advice if you get a fever, chills, or sore throat, or other symptoms of a cold or flu. Do not treat yourself. Try to avoid being around people who are sick. Avoid taking medicines that contain aspirin, acetaminophen, ibuprofen, naproxen, or ketoprofen unless instructed by your healthcare professional. These medicines may hide a fever. This medicine may increase your risk to bruise or bleed. Call your doctor or health care professional if you notice any unusual bleeding. Be careful brushing and flossing your teeth or using a toothpick because you may get an infection or bleed more easily. If you have any  dental work done, tell your dentist you are receiving this medicine. Do not become pregnant while taking this medicine or for 14 months after stopping it. Women should inform their healthcare professional if they wish to become pregnant or think they might be pregnant. Men should not father a child while taking this medicine and for 11 months after stopping it. There is potential for serious side effects to an unborn child. Talk to your healthcare professional for more information. Do not breast-feed an infant while taking this medicine. This medicine has caused ovarian failure in some women. This medicine may make it more difficult to get pregnant. Talk to your healthcare professional if you are concerned about your fertility. This medicine has caused decreased sperm counts in some men. This may make it more difficult to father a child. Talk to your healthcare professional if you are concerned about your fertility. Drink fluids as directed while you are taking this medicine. This will help protect your kidneys. Call your doctor or health care professional if you get diarrhea. Do not treat yourself. What side  effects may I notice from receiving this medicine? Side effects that you should report to your doctor or health care professional as soon as possible:  allergic reactions like skin rash, itching or hives, swelling of the face, lips, or tongue  blurred vision  changes in vision  decreased hearing or ringing of the ears  nausea, vomiting  pain, redness, or irritation at site where injected  pain, tingling, numbness in the hands or feet  signs and symptoms of bleeding such as bloody or black, tarry stools; red or dark brown urine; spitting up blood or brown material that looks like coffee grounds; red spots on the skin; unusual bruising or bleeding from the eyes, gums, or nose  signs and symptoms of infection like fever; chills; cough; sore throat; pain or trouble passing urine  signs  and symptoms of kidney injury like trouble passing urine or change in the amount of urine  signs and symptoms of low red blood cells or anemia such as unusually weak or tired; feeling faint or lightheaded; falls; breathing problems Side effects that usually do not require medical attention (report to your doctor or health care professional if they continue or are bothersome):  loss of appetite  mouth sores  muscle cramps This list may not describe all possible side effects. Call your doctor for medical advice about side effects. You may report side effects to FDA at 1-800-FDA-1088. Where should I keep my medicine? This drug is given in a hospital or clinic and will not be stored at home. NOTE: This sheet is a summary. It may not cover all possible information. If you have questions about this medicine, talk to your doctor, pharmacist, or health care provider.  2021 [employer]/Gold Standard (2018-05-18 15:59:17)

## 2020-07-13 NOTE — Patient Instructions (Signed)

## 2020-07-13 NOTE — Progress Notes (Signed)
Ok to treat with abnormal CBC per Dr Marin Olp. Pt to receive 1 unit PRBC Wednesday. dph

## 2020-07-14 ENCOUNTER — Encounter: Payer: Self-pay | Admitting: *Deleted

## 2020-07-14 ENCOUNTER — Ambulatory Visit
Admission: RE | Admit: 2020-07-14 | Discharge: 2020-07-14 | Disposition: A | Discharge status: Home / Self Care | Payer: Medicare Other | Source: Ambulatory Visit | Attending: Urology | Admitting: Urology

## 2020-07-14 DIAGNOSIS — C3490 Malignant neoplasm of unspecified part of unspecified bronchus or lung: Secondary | ICD-10-CM | POA: Insufficient documentation

## 2020-07-14 DIAGNOSIS — Z510 Encounter for antineoplastic radiation therapy: Secondary | ICD-10-CM | POA: Insufficient documentation

## 2020-07-14 DIAGNOSIS — C7931 Secondary malignant neoplasm of brain: Secondary | ICD-10-CM | POA: Diagnosis not present

## 2020-07-14 DIAGNOSIS — Z85118 Personal history of other malignant neoplasm of bronchus and lung: Secondary | ICD-10-CM | POA: Diagnosis not present

## 2020-07-14 NOTE — Progress Notes (Signed)
Oncology Nurse Navigator Documentation  Oncology Nurse Navigator Flowsheets 07/14/2020  Abnormal Finding Date -  Confirmed Diagnosis Date -  Diagnosis Status -  Planned Course of Treatment -  Phase of Treatment -  Chemotherapy Actual Start Date: -  Radiation Actual Start Date: -  Navigator Follow Up Date: 07/20/2020  Navigator Follow Up Reason: Follow-up Appointment;Chemotherapy  Navigator [employer], [employer] Encounter Type Appt/Treatment Plan Review  Telephone -  Treatment Initiated Date -  Patient Visit Type MedOnc  Treatment Phase Active Tx  Barriers/Navigation Needs Coordination of Care;Education  Education -  Interventions Coordination of Care  Acuity Level 2-Minimal Needs (1-2 Barriers Identified)  Referrals -  Coordination of Care Appts  Education Method -  Support Groups/Services Friends and Family  Time Spent with Patient 30

## 2020-07-15 ENCOUNTER — Other Ambulatory Visit: Payer: Self-pay

## 2020-07-15 ENCOUNTER — Ambulatory Visit
Admission: RE | Admit: 2020-07-15 | Discharge: 2020-07-15 | Disposition: A | Discharge status: Home / Self Care | Payer: Medicare Other | Source: Ambulatory Visit | Attending: Urology | Admitting: Urology

## 2020-07-15 ENCOUNTER — Inpatient Hospital Stay: Payer: Medicare Other

## 2020-07-15 DIAGNOSIS — Z5111 Encounter for antineoplastic chemotherapy: Secondary | ICD-10-CM | POA: Diagnosis not present

## 2020-07-15 DIAGNOSIS — C3431 Malignant neoplasm of lower lobe, right bronchus or lung: Secondary | ICD-10-CM | POA: Diagnosis not present

## 2020-07-15 DIAGNOSIS — D649 Anemia, unspecified: Secondary | ICD-10-CM

## 2020-07-15 DIAGNOSIS — R634 Abnormal weight loss: Secondary | ICD-10-CM | POA: Diagnosis not present

## 2020-07-15 DIAGNOSIS — C3490 Malignant neoplasm of unspecified part of unspecified bronchus or lung: Secondary | ICD-10-CM | POA: Diagnosis not present

## 2020-07-15 DIAGNOSIS — C7931 Secondary malignant neoplasm of brain: Secondary | ICD-10-CM

## 2020-07-15 DIAGNOSIS — C4372 Malignant melanoma of left lower limb, including hip: Secondary | ICD-10-CM | POA: Diagnosis not present

## 2020-07-15 DIAGNOSIS — C787 Secondary malignant neoplasm of liver and intrahepatic bile duct: Secondary | ICD-10-CM | POA: Diagnosis not present

## 2020-07-15 MED ORDER — SODIUM CHLORIDE 0.9% IV SOLUTION
250.0000 mL | Freq: Once | INTRAVENOUS | Status: AC
  Administered 2020-07-15: 250 mL via INTRAVENOUS
  Filled 2020-07-15: qty 250

## 2020-07-15 MED ORDER — ACETAMINOPHEN 325 MG PO TABS
ORAL_TABLET | ORAL | Status: AC
  Filled 2020-07-15: qty 2

## 2020-07-15 MED ORDER — HEPARIN SOD (PORK) LOCK FLUSH 100 UNIT/ML IV SOLN
500.0000 [IU] | Freq: Every day | INTRAVENOUS | Status: AC | PRN
  Administered 2020-07-15: 500 [IU]
  Filled 2020-07-15: qty 5

## 2020-07-15 MED ORDER — DIPHENHYDRAMINE HCL 25 MG PO CAPS
25.0000 mg | ORAL_CAPSULE | Freq: Once | ORAL | Status: AC
  Administered 2020-07-15: 25 mg via ORAL

## 2020-07-15 MED ORDER — ACETAMINOPHEN 325 MG PO TABS
650.0000 mg | ORAL_TABLET | Freq: Once | ORAL | Status: AC
  Administered 2020-07-15: 650 mg via ORAL

## 2020-07-15 MED ORDER — SODIUM CHLORIDE 0.9% FLUSH
10.0000 mL | INTRAVENOUS | Status: AC | PRN
  Administered 2020-07-15: 10 mL
  Filled 2020-07-15: qty 10

## 2020-07-15 NOTE — Patient Instructions (Signed)

## 2020-07-15 NOTE — Progress Notes (Addendum)
Radiation Oncology         [phone] ________________________________  Name: Walter Hernandez MRN: [id number]  Date: 07/14/2020  DOB: 19-Aug-1950  Post Treatment Note  CC: Lavone Orn, MD  Lavone Orn, MD  Diagnosis:   70 yo man with brain metastases from extensive stage small cell lung cancer.  Interval Since Last Radiation:  1 year  1/13-1/26/21:   The whole brain was treated to 30 Gy in 10 fractions of 3 Gy  Narrative:  I spoke with the patient and his wife to conduct his routine scheduled 3 month follow up visit to review results from his recent brain MRI via telephone to spare the patient unnecessary potential exposure in the healthcare setting during the current COVID-19 pandemic.  The patient was notified in advance and gave permission to proceed with this visit format. The patient has recovered well from his previous WBRT.  He reports occasional mild headaches that have been occurring more frequently over the past month but respond to OTC medications.  He denies, N/V, dizziness, imbalance, focal weakness, changes in auditory or visual acuity, tremors or seizure activity. He completed 4 cycles of systemic chemotherapy with Carboplatin/VP-16/Tecentriq and transitioned to maintenance immunotherapy with single agent Tecentriq on 10/01/19 under the care of Dr. Marin Olp.  Unfortunately, a repeat PET scan on 10/17/19 showed progressive disease with worsening liver metastases and increased LAN in the mediastinum, hilum and upper abdomen. Therefore, the Tecentriq was discontinued and he was switched to Zelpelca/Keytruda with his first cycle on 10/28/19. A restaging PET scan on 12/23/19, after completing 3 cycles of his new systemic therapy showed a near complete metabolic response to treatment with only mild residual hypermetabolism within a single portacaval nodal metastasis, which had decreased in size and significantly decreased in metabolism. Otherwise resolved right lower neck, mediastinal, right  hilar and upper abdominal lymphadenopathy and resolved liver metastases. However, a follow up PET scan for disease restaging, performed on 03/16/20 showed significant disease progression with approximately 12 new metastatic lesions in the liver; new and increased porta hepatis and portacaval adenopathy; and new and increased thoracic adenopathy in addition to a right level V lymph node which is small but hypermetabolic. His systemic therapy was again changed, discontinuing the Zelpelca/Keytruda and starting CDDP/Irinotecan cycle #1 on 03/23/2020. A repeat PET scan on 04/29/20 showed an excellent response to treatment with marked improvement with the hepatic metastatic lesions markedly reduced in size and no longer hypermetabolic and with the thoracic, porta hepatis and right neck level V lymph nodes no longer hypermetabolic and also reduced in size.  His follow-up brain MRI scans continued to show an excellent response to his radiation treatment with stable to decreased size of a treated right frontal lesion, stable punctate left frontal lesion and no new masses or abnormal enhancement. He has continued to tolerate the systemic treatments well aside from some diarrhea which has been manageable.  As of 07/06/2020, he has completed 4 cycles with several treatment delays and dose adjustments along the way.  Unfortunately, his most recent MRI brain scan from 07/10/2020 shows disease progression with 6 new enhancing lesions with a 12 mm right frontal lobe lesion, 10 mm medial right thalamic lesion, 6 mm right mid brain lesion, 12 mm left temporal lesion, 1 mm left occipital lesion and a 2 mm left cerebellar lesion.  On review with the radiologist at multidisciplinary conference, the cerebellar lesion was felt most likely to be vascular and not a metastasis. I reviewed these results today with him and  his wife as well as the consensus recommendation from the recent multidisciplinary brain conference on 07/13/2020, to  proceed with salvage SRS treatment in a single fraction.  On review of systems, the patient states that he is doing well in general.  He has had some significant fatigue over the past several months due to anemia associated with his systemic therapy.  This has led to several treatment delays due to the need for blood products and/or recovery of strength.  He is no longer having any N/V.  He continues with poor appetite but has continued to eat meals regularly in an attempt to maintain his weight.. He has had significant improvement in his swallowing with solids since having an esophageal dilatation. He is drinking 2-3 protein shakes per day to supplement his nutrition in addition to more regular meals. He still has some decreased taste/change in his taste buds but is adapting. Otherwise, he denies severe headaches, dizziness, paraesthesias, focal weakness, tremors or seizure activity.  The left sided weakness remains completely resolved which he is delighted with. He feels like he is gradually regaining his strength. Overall, he is pleased with his progress to date.  ALLERGIES:  has No Known Allergies.  Meds: Current Outpatient Medications  Medication Sig Dispense Refill  . acetaminophen (TYLENOL) 500 MG tablet Take 1,000 mg by mouth every 6 (six) hours as needed for moderate pain.    Marland Kitchen ALPRAZolam (XANAX) 0.5 MG tablet Take 1 tablet (0.5 mg total) by mouth every 6 (six) hours as needed for anxiety. (Patient not taking: No sig reported) 60 tablet 0  . cholestyramine (QUESTRAN) 4 g packet Take 1 packet (4 g total) by mouth 3 (three) times daily with meals. (Patient not taking: Reported on 07/01/2020) 60 each 12  . dexamethasone (DECADRON) 4 MG tablet Take 2 tablets (8 mg total) by mouth daily. Take daily x 3 days starting the day after cisplatin chemotherapy. Take with food. 30 tablet 1  . diphenoxylate-atropine (LOMOTIL) 2.5-0.025 MG tablet Take 2 tablets at onset of diarrhea. Take one tablet after each  loose stool. Max 8 tablets per day. 60 tablet 2  . dronabinol (MARINOL) 5 MG capsule Take 1 capsule (5 mg total) by mouth 2 (two) times daily before a meal. (Patient not taking: No sig reported) 60 capsule 2  . finasteride (PROSCAR) 5 MG tablet TAKE 1 TABLET BY MOUTH EVERY DAY 90 tablet 1  . furosemide (LASIX) 20 MG tablet Take 2 tablets (40 mg total) by mouth daily. 30 tablet 5  . lidocaine-prilocaine (EMLA) cream Apply to affected area once 30 g 3  . loperamide (IMODIUM A-D) 2 MG tablet Take 2 at diarrhea onset, then 1 every 2hr until 12hrs with no BM. May take 2 every 4hrs at night. If diarrhea recurs repeat. 100 tablet 1  . LORazepam (ATIVAN) 0.5 MG tablet Take 1 tablet (0.5 mg total) by mouth every 6 (six) hours as needed (Nausea or vomiting). 30 tablet 0  . memantine (NAMENDA) 10 MG tablet Take 10 mg by mouth daily. (Patient not taking: No sig reported)    . Multiple Vitamin (MULTIVITAMIN) capsule Take 1 capsule by mouth daily.    . Naphazoline-Pheniramine (OPCON-A OP) Place 2 drops into both eyes as needed (for dry eyes).    . nicotine (NICODERM CQ - DOSED IN MG/24 HOURS) 21 mg/24hr patch PLACE 1 PATCH (21 MG TOTAL) ONTO THE SKIN DAILY. 28 patch 2  . omeprazole (PRILOSEC) 40 MG capsule Take 1 capsule (40 mg total) by  mouth 2 (two) times daily. 180 capsule 3  . ondansetron (ZOFRAN) 4 MG tablet Take 1 tablet (4 mg total) by mouth every 6 (six) hours as needed for nausea. (Patient not taking: No sig reported) 20 tablet 0  . ondansetron (ZOFRAN) 8 MG tablet Take 1 tablet (8 mg total) by mouth 2 (two) times daily as needed. Start on the third day after cisplatin chemotherapy. 30 tablet 1  . ondansetron (ZOFRAN-ODT) 8 MG disintegrating tablet Take 8 mg by mouth.    . potassium chloride SA (KLOR-CON) 20 MEQ tablet Take 1 tablet (20 mEq total) by mouth daily. (Patient not taking: No sig reported) 30 tablet 4  . prochlorperazine (COMPAZINE) 10 MG tablet Take 1 tablet (10 mg total) by mouth every 6  (six) hours as needed (Nausea or vomiting). (Patient not taking: No sig reported) 30 tablet 1  . tamsulosin (FLOMAX) 0.4 MG CAPS capsule Take 1 capsule (0.4 mg total) by mouth daily. 60 capsule 1   No current facility-administered medications for this encounter.   Facility-Administered Medications Ordered in Other Encounters  Medication Dose Route Frequency Provider Last Rate Last Admin  . sodium chloride flush (NS) 0.9 % injection 10 mL  10 mL Intravenous PRN Volanda Napoleon, MD   10 mL at 10/21/19 9628    Physical Findings:  vitals were not taken for this visit.   Karen Kays to assess due to telephone follow-up visit format.  Lab Findings: Lab Results  Component Value Date   WBC 3.9 (L) 07/13/2020   HGB 7.8 (L) 07/13/2020   HCT 22.4 (L) 07/13/2020   MCV 95.3 07/13/2020   PLT 77 (L) 07/13/2020     Radiographic Findings: MR Brain W Wo Contrast  Result Date: 07/10/2020 CLINICAL DATA:  Brain/CNS neoplasm.  Surveillance. EXAM: MRI HEAD WITHOUT AND WITH CONTRAST TECHNIQUE: Multiplanar, multiecho pulse sequences of the brain and surrounding structures were obtained without and with intravenous contrast. CONTRAST:  7.48mL GADAVIST GADOBUTROL 1 MMOL/ML IV SOLN COMPARISON:  MRI of the brain April 09, 2020. FINDINGS: Brain: Enhancing lesions labeled on postcontrast axial T1 images (series 1100): Larger lesions: Left superior frontal gyrus, 6 mm, image 254; patchy enhancing lesion in the right pre frontal gyrus, 7-8 mm, image 232. New lesions: Right frontal lobe, 12 mm, image 210; Medial right thalamus, 10 mm, image 176; Midbrain on the right side, 6 mm, image 169; Left temporal lobe, 12 mm, image 170; Left occipital lobe, 1 mm, image 153; Left cerebellar hemisphere, 2 mm, image 126. Confluent T2 hyperintensity within the white matter of the cerebral hemispheres most consistent with posttreatment changes appear stable. Mild T2 hyperintensity within the right side of the pons is also unchanged. No  evidence of an acute infarct, hemorrhage or extra-axial fluid collection. Vascular: Normal flow voids. Skull and upper cervical spine: Normal marrow signal. Sinuses/Orbits: Trace mucosal thickening throughout the paranasal sinuses. The orbits are maintained. Other: Bilateral mastoid effusion. IMPRESSION: 1. Progression of metastatic disease with 6 new enhancing lesions, as above. 2. Stable posttreatment changes within the white matter of the cerebral hemispheres and right side of the pons. 3. Bilateral mastoid effusion. Electronically Signed   By: Pedro Earls M.D.   On: 07/10/2020 11:32    Impression/Plan: 29. 70 yo male with brain metastases from extensive stage small cell lung cancer. Today, we talked to the patient and his wife, Walter Hernandez, about the findings and workup thus far. We discussed the natural history of metastatic small cell carcinoma and general treatment,  highlighting the role of radiotherapy in the management. We discussed the available radiation techniques, and focused on the details and logistics of delivery.  The consensus recommendation from multidisciplinary brain conference is to proceed with a single fraction of salvage SRS targeting the 5 new lesions in the brain.  We reviewed the anticipated acute and late sequelae associated with radiation in this setting. The patient and his wife were encouraged to ask questions that were answered to their satisfaction.  At the conclusion of our discussion, the patient is in agreement to proceed with the recommended single fraction of salvage SRS to treat the 5 new brain lesions.  The new 2 mm cerebellar lesion was felt most likely to be vascular and not a metastasis so we will just continue to monitor this on future scans. He is tentatively scheduled for CT simulation at 10 AM on Thursday, 07/16/2020.  He is set up for a consult visit with Dr. Erline Levine on 07/15/2020 at 1 PM as he will be involved in the planning and delivery of the  [facility] treatment on 07/22/2020.  He appears to have a good understanding of his disease and our treatment recommendations which are of palliative intent and he is comfortable and in agreement with the stated plan.  Therefore, I will share our discussion with Dr. Marin Olp and we will proceed with treatment planning accordingly.   Given current concerns for patient exposure during the COVID-19 pandemic, this encounter was conducted via telephone. The patient was notified in advance and was offered a [facility] meeting to allow for face to face communication but unfortunately reported that he did not have the appropriate resources/technology to support such a visit and instead preferred to proceed with telephone consult. The patient has given verbal consent for this type of encounter. The time spent during this encounter was 25 minutes. The attendants for this meeting include Raheem Kolbe PA-C, Mont Dutton, RTT, patient, Salik Grewell and his wife, Walter Hernandez. During the encounter, Brizeida Mcmurry PA-C, and Mont Dutton, RTT were located at [facility] Radiation Oncology Department.  Patient, Kylee Nardozzi and his wife, Walter Hernandez  were located at home.    Nicholos Johns, PA-C

## 2020-07-16 ENCOUNTER — Ambulatory Visit
Admission: RE | Admit: 2020-07-16 | Discharge: 2020-07-16 | Disposition: A | Discharge status: Home / Self Care | Payer: Medicare Other | Source: Ambulatory Visit | Attending: Radiation Oncology | Admitting: Radiation Oncology

## 2020-07-16 ENCOUNTER — Other Ambulatory Visit: Payer: Self-pay

## 2020-07-16 ENCOUNTER — Other Ambulatory Visit: Payer: Self-pay | Admitting: Hematology & Oncology

## 2020-07-16 VITALS — BP 122/76 | HR 92 | Temp 98.4°F | Resp 20 | Ht 67.0 in | Wt 158.6 lb

## 2020-07-16 DIAGNOSIS — C4372 Malignant melanoma of left lower limb, including hip: Secondary | ICD-10-CM

## 2020-07-16 DIAGNOSIS — C7931 Secondary malignant neoplasm of brain: Secondary | ICD-10-CM

## 2020-07-16 DIAGNOSIS — Z510 Encounter for antineoplastic radiation therapy: Secondary | ICD-10-CM | POA: Diagnosis not present

## 2020-07-16 DIAGNOSIS — C3490 Malignant neoplasm of unspecified part of unspecified bronchus or lung: Secondary | ICD-10-CM | POA: Diagnosis not present

## 2020-07-16 LAB — TYPE AND SCREEN
ABO/RH(D): O POS
Antibody Screen: NEGATIVE
Unit division: 0

## 2020-07-16 LAB — BPAM RBC
Blood Product Expiration Date: 202203072359
ISSUE DATE / TIME: 202202090743
Unit Type and Rh: 5100

## 2020-07-16 MED ORDER — SODIUM CHLORIDE 0.9% FLUSH
10.0000 mL | Freq: Once | INTRAVENOUS | Status: AC
  Administered 2020-07-16: 10 mL via INTRAVENOUS

## 2020-07-16 MED ORDER — HEPARIN SOD (PORK) LOCK FLUSH 100 UNIT/ML IV SOLN
500.0000 [IU] | Freq: Once | INTRAVENOUS | Status: AC
  Administered 2020-07-16: 500 [IU] via INTRAVENOUS

## 2020-07-16 NOTE — Addendum Note (Signed)
Encounter addended by: Freeman Caldron, PA-C on: 07/16/2020 11:00 AM  Actions taken: Clinical Note Signed

## 2020-07-16 NOTE — Progress Notes (Signed)
Flushed access per protocol. Removed access needle. Needle intact upon removal. Patient tolerated well. Applied an occlusive dressing to old access site. Instructed patient to remove dressing in approximately 20 minutes. Patient and wife verbalized understanding.

## 2020-07-16 NOTE — Progress Notes (Signed)
Has armband been applied?  Yes.    Does patient have an allergy to IV contrast dye?: No.   Has patient ever received premedication for IV contrast dye?: No.   Does patient take metformin?: No.  If patient does take metformin when was the last dose: n/a  Date of lab work: 07/13/2020 BUN: 12 CR: 1.23  IV site: subclavian right, condition no redness  Has IV site been added to flowsheet?  Yes.

## 2020-07-16 NOTE — Progress Notes (Signed)
  Radiation Oncology         [phone] ________________________________  Name: Walter Hernandez MRN: [id number]  Date: 07/16/2020  DOB: 1950/09/15  SIMULATION AND TREATMENT PLANNING NOTE    ICD-10-CM   1. Brain metastases ([facility])  C79.31     DIAGNOSIS:  70 yo man with brain metastases from stage small cell lung cancer s/p previous whole brain radiotherapy.  NARRATIVE:  The patient was brought to the [facility].  Identity was confirmed.  All relevant records and images related to the planned course of therapy were reviewed.  The patient freely provided informed written consent to proceed with treatment after reviewing the details related to the planned course of therapy. The consent form was witnessed and verified by the simulation staff. Intravenous access was established for contrast administration. Then, the patient was set-up in a stable reproducible supine position for radiation therapy.  A relocatable thermoplastic stereotactic head frame was fabricated for precise immobilization.  CT images were obtained.  Surface markings were placed.  The CT images were loaded into the planning software and fused with the patient's targeting MRI scan.  Then the target and avoidance structures were contoured.  Treatment planning then occurred.  The radiation prescription was entered and confirmed.  I have requested 3D planning  I have requested a DVH of the following structures: Brain stem, brain, left eye, right eye, lenses, optic chiasm, target volumes, uninvolved brain, and normal tissue.    SPECIAL TREATMENT PROCEDURE:  The planned course of therapy using radiation constitutes a special treatment procedure. Special care is required in the management of this patient for the following reasons. This treatment constitutes a Special Treatment Procedure for the following reason: High dose per fraction requiring special monitoring for increased toxicities of treatment including daily imaging.  The  special nature of the planned course of radiotherapy will require increased physician supervision and oversight to ensure patient's safety with optimal treatment outcomes.  PLAN:  The patient will receive 20 Gy in 1 fraction(s).   Right frontal lobe, 12 mm, image 210;  Medial right thalamus, 10 mm, image 176;  Midbrain on the right side, 6 mm, image 169;  Left temporal lobe, 12 mm, image 170;  Left occipital lobe, 1 mm, image 153;  Left cerebellar hemisphere, 2 mm, image 126. ________________________________  Sheral Apley Tammi Klippel, M.D.

## 2020-07-20 ENCOUNTER — Inpatient Hospital Stay: Payer: Medicare Other

## 2020-07-20 ENCOUNTER — Encounter: Payer: Self-pay | Admitting: Hematology & Oncology

## 2020-07-20 ENCOUNTER — Encounter: Payer: Self-pay | Admitting: *Deleted

## 2020-07-20 ENCOUNTER — Inpatient Hospital Stay (HOSPITAL_BASED_OUTPATIENT_CLINIC_OR_DEPARTMENT_OTHER): Payer: Medicare Other | Admitting: Hematology & Oncology

## 2020-07-20 ENCOUNTER — Other Ambulatory Visit: Payer: Self-pay

## 2020-07-20 VITALS — BP 111/72 | HR 89 | Temp 98.2°F | Resp 17 | Wt 158.0 lb

## 2020-07-20 DIAGNOSIS — R634 Abnormal weight loss: Secondary | ICD-10-CM | POA: Diagnosis not present

## 2020-07-20 DIAGNOSIS — C4372 Malignant melanoma of left lower limb, including hip: Secondary | ICD-10-CM

## 2020-07-20 DIAGNOSIS — Z5111 Encounter for antineoplastic chemotherapy: Secondary | ICD-10-CM | POA: Diagnosis not present

## 2020-07-20 DIAGNOSIS — C787 Secondary malignant neoplasm of liver and intrahepatic bile duct: Secondary | ICD-10-CM | POA: Diagnosis not present

## 2020-07-20 DIAGNOSIS — C7931 Secondary malignant neoplasm of brain: Secondary | ICD-10-CM | POA: Diagnosis not present

## 2020-07-20 DIAGNOSIS — C3490 Malignant neoplasm of unspecified part of unspecified bronchus or lung: Secondary | ICD-10-CM

## 2020-07-20 DIAGNOSIS — C3431 Malignant neoplasm of lower lobe, right bronchus or lung: Secondary | ICD-10-CM | POA: Diagnosis not present

## 2020-07-20 LAB — CBC WITH DIFFERENTIAL (CANCER CENTER ONLY)
Abs Immature Granulocytes: 0.2 10*3/uL — ABNORMAL HIGH (ref 0.00–0.07)
Basophils Absolute: 0 10*3/uL (ref 0.0–0.1)
Basophils Relative: 0 %
Eosinophils Absolute: 0 10*3/uL (ref 0.0–0.5)
Eosinophils Relative: 1 %
HCT: 26.6 % — ABNORMAL LOW (ref 39.0–52.0)
Hemoglobin: 9.3 g/dL — ABNORMAL LOW (ref 13.0–17.0)
Immature Granulocytes: 5 %
Lymphocytes Relative: 27 %
Lymphs Abs: 1.1 10*3/uL (ref 0.7–4.0)
MCH: 33.2 pg (ref 26.0–34.0)
MCHC: 35 g/dL (ref 30.0–36.0)
MCV: 95 fL (ref 80.0–100.0)
Monocytes Absolute: 0.6 10*3/uL (ref 0.1–1.0)
Monocytes Relative: 14 %
Neutro Abs: 2.2 10*3/uL (ref 1.7–7.7)
Neutrophils Relative %: 53 %
Platelet Count: 118 10*3/uL — ABNORMAL LOW (ref 150–400)
RBC: 2.8 MIL/uL — ABNORMAL LOW (ref 4.22–5.81)
RDW: 20.1 % — ABNORMAL HIGH (ref 11.5–15.5)
WBC Count: 4.1 10*3/uL (ref 4.0–10.5)
nRBC: 0 % (ref 0.0–0.2)

## 2020-07-20 LAB — CMP (CANCER CENTER ONLY)
ALT: 11 U/L (ref 0–44)
AST: 14 U/L — ABNORMAL LOW (ref 15–41)
Albumin: 4.2 g/dL (ref 3.5–5.0)
Alkaline Phosphatase: 67 U/L (ref 38–126)
Anion gap: 9 (ref 5–15)
BUN: 20 mg/dL (ref 8–23)
CO2: 27 mmol/L (ref 22–32)
Calcium: 9.6 mg/dL (ref 8.9–10.3)
Chloride: 97 mmol/L — ABNORMAL LOW (ref 98–111)
Creatinine: 1.37 mg/dL — ABNORMAL HIGH (ref 0.61–1.24)
GFR, Estimated: 55 mL/min — ABNORMAL LOW (ref >60)
Glucose, Bld: 106 mg/dL — ABNORMAL HIGH (ref 70–99)
Potassium: 4.1 mmol/L (ref 3.5–5.1)
Sodium: 133 mmol/L — ABNORMAL LOW (ref 135–145)
Total Bilirubin: 0.5 mg/dL (ref 0.3–1.2)
Total Protein: 6.2 g/dL — ABNORMAL LOW (ref 6.5–8.1)

## 2020-07-20 LAB — LACTATE DEHYDROGENASE: LDH: 158 U/L (ref 98–192)

## 2020-07-20 LAB — MAGNESIUM: Magnesium: 1.3 mg/dL — ABNORMAL LOW (ref 1.7–2.4)

## 2020-07-20 MED ORDER — HEPARIN SOD (PORK) LOCK FLUSH 100 UNIT/ML IV SOLN
500.0000 [IU] | Freq: Once | INTRAVENOUS | Status: AC | PRN
  Administered 2020-07-20: 500 [IU]
  Filled 2020-07-20: qty 5

## 2020-07-20 MED ORDER — SODIUM CHLORIDE 0.9% FLUSH
10.0000 mL | INTRAVENOUS | Status: DC | PRN
  Administered 2020-07-20: 10 mL
  Filled 2020-07-20: qty 10

## 2020-07-20 MED ORDER — MAGNESIUM SULFATE 2 GM/50ML IV SOLN
2.0000 g | Freq: Once | INTRAVENOUS | Status: AC
Start: 2020-07-20 — End: 2020-07-20
  Administered 2020-07-20: 2 g via INTRAVENOUS
  Filled 2020-07-20: qty 50

## 2020-07-20 MED ORDER — PALONOSETRON HCL INJECTION 0.25 MG/5ML
0.2500 mg | Freq: Once | INTRAVENOUS | Status: AC
  Administered 2020-07-20: 0.25 mg via INTRAVENOUS

## 2020-07-20 MED ORDER — MAGNESIUM SULFATE 2 GM/50ML IV SOLN
2.0000 g | Freq: Once | INTRAVENOUS | Status: AC
  Administered 2020-07-20: 2 g via INTRAVENOUS
  Filled 2020-07-20: qty 50

## 2020-07-20 MED ORDER — SODIUM CHLORIDE 0.9 % IV SOLN
10.0000 mg | Freq: Once | INTRAVENOUS | Status: AC
  Administered 2020-07-20: 10 mg via INTRAVENOUS
  Filled 2020-07-20: qty 10

## 2020-07-20 MED ORDER — POTASSIUM CHLORIDE IN NACL 20-0.9 MEQ/L-% IV SOLN
Freq: Once | INTRAVENOUS | Status: AC
  Filled 2020-07-20: qty 1000

## 2020-07-20 MED ORDER — SODIUM CHLORIDE 0.9 % IV SOLN
30.0000 mg/m2 | Freq: Once | INTRAVENOUS | Status: AC
  Administered 2020-07-20: 56 mg via INTRAVENOUS
  Filled 2020-07-20: qty 56

## 2020-07-20 MED ORDER — PALONOSETRON HCL INJECTION 0.25 MG/5ML
INTRAVENOUS | Status: AC
  Filled 2020-07-20: qty 5

## 2020-07-20 MED ORDER — SODIUM CHLORIDE 0.9 % IV SOLN
150.0000 mg | Freq: Once | INTRAVENOUS | Status: AC
  Administered 2020-07-20: 150 mg via INTRAVENOUS
  Filled 2020-07-20: qty 150

## 2020-07-20 MED ORDER — SODIUM CHLORIDE 0.9 % IV SOLN
Freq: Once | INTRAVENOUS | Status: AC
  Filled 2020-07-20: qty 250

## 2020-07-20 NOTE — Patient Instructions (Signed)
[facility] Discharge Instructions for Patients Receiving Chemotherapy  Today you received the following chemotherapy agents Cisplatin and Irinotecan  To help prevent nausea and vomiting after your treatment, we encourage you to take your nausea medication as prescribed by MD. **DO NOT TAKE ZOFRAN FOR 3 DAYS AFTER CHEMOTHERAPY**   If you develop nausea and vomiting that is not controlled by your nausea medication, call the clinic.   BELOW ARE SYMPTOMS THAT SHOULD BE REPORTED IMMEDIATELY:  *FEVER GREATER THAN 100.5 F  *CHILLS WITH OR WITHOUT FEVER  NAUSEA AND VOMITING THAT IS NOT CONTROLLED WITH YOUR NAUSEA MEDICATION  *UNUSUAL SHORTNESS OF BREATH  *UNUSUAL BRUISING OR BLEEDING  TENDERNESS IN MOUTH AND THROAT WITH OR WITHOUT PRESENCE OF ULCERS  *URINARY PROBLEMS  *BOWEL PROBLEMS  UNUSUAL RASH Items with * indicate a potential emergency and should be followed up as soon as possible.  Feel free to call the clinic should you have any questions or concerns. The clinic phone number is [phone].  Please show the [facility] at check-in to the Emergency Department and triage nurse.

## 2020-07-20 NOTE — Progress Notes (Signed)
Hematology and Oncology Follow Up Visit  Walter Hernandez [id number] [birth year] 70 y.o. 07/20/2020   Principle Diagnosis:  Stage IB (T2aN0M0) superficial spreading melanoma of the left lower leg  Small Cell Lung Cancer -- Extensive Stage -- lung/liver/brain mets  Past Therapy: Cranial XRT -- 06/19/2019 thru 07/02/2019  Current Therapy:        Carboplatin/VP-16/Tecentriq -- started on 07/07/2018, s/p cycle #4 Tecentriq - maintenance -- cycle#1 -- start on 10/01/2019 - d/c on 10/21/2019 Zepzelca/Keytruda -- q 3 wk --s/p cycle #6 - started on 10/28/2019 -- d/c on 03/15/2020 CDDP/Irinotecan -- s/p cycle #4 -- started on 03/23/2020   Interim History:  Walter Hernandez is here today for follow-up.  He looks good.  He feels good.  Unfortunately, he does have new CNS metastasis.  He had a routine MRI done.  This showed a 6 new small cerebral metastasis.  He will have radiosurgery by Radiation Oncology this week.  We have adjusted his chemotherapy a little bit.  Because of his lower blood counts, we are holding the irinotecan on day 8.  He had a good weekend.  He felt good.  He ate well.  He enjoyed the warm weather that we had on Friday and Saturday.  He has had no diarrhea.  He has had no bleeding.  He has had no visual changes.  He has had no leg swelling.  Overall, I would have to say his performance status is ECOG 1.    Medications:  Allergies as of 07/20/2020   No Known Allergies     Medication List       Accurate as of July 20, 2020  8:37 AM. If you have any questions, ask your nurse or doctor.        acetaminophen 500 MG tablet Commonly known as: TYLENOL Take 1,000 mg by mouth every 6 (six) hours as needed for moderate pain.   ALPRAZolam 0.5 MG tablet Commonly known as: XANAX Take 1 tablet (0.5 mg total) by mouth every 6 (six) hours as needed for anxiety.   cholestyramine 4 g packet Commonly known as: Questran Take 1 packet (4 g total) by mouth 3 (three) times daily with  meals.   dexamethasone 4 MG tablet Commonly known as: DECADRON Take 2 tablets (8 mg total) by mouth daily. Take daily x 3 days starting the day after cisplatin chemotherapy. Take with food.   diphenoxylate-atropine 2.5-0.025 MG tablet Commonly known as: LOMOTIL Take 2 tablets at onset of diarrhea. Take one tablet after each loose stool. Max 8 tablets per day.   dronabinol 5 MG capsule Commonly known as: MARINOL Take 1 capsule (5 mg total) by mouth 2 (two) times daily before a meal.   finasteride 5 MG tablet Commonly known as: PROSCAR TAKE 1 TABLET BY MOUTH EVERY DAY   furosemide 20 MG tablet Commonly known as: LASIX Take 2 tablets (40 mg total) by mouth daily.   lidocaine-prilocaine cream Commonly known as: EMLA Apply to affected area once   loperamide 2 MG capsule Commonly known as: IMODIUM   LORazepam 0.5 MG tablet Commonly known as: Ativan Take 1 tablet (0.5 mg total) by mouth every 6 (six) hours as needed (Nausea or vomiting).   memantine 10 MG tablet Commonly known as: NAMENDA Take 10 mg by mouth daily.   multivitamin capsule Take 1 capsule by mouth daily.   nicotine 21 mg/24hr patch Commonly known as: NICODERM CQ - dosed in mg/24 hours PLACE 1 PATCH (21 MG TOTAL) ONTO THE SKIN DAILY.  omeprazole 40 MG capsule Commonly known as: PRILOSEC Take 1 capsule (40 mg total) by mouth 2 (two) times daily.   ondansetron 4 MG tablet Commonly known as: ZOFRAN Take 1 tablet (4 mg total) by mouth every 6 (six) hours as needed for nausea.   ondansetron 8 MG tablet Commonly known as: Zofran Take 1 tablet (8 mg total) by mouth 2 (two) times daily as needed. Start on the third day after cisplatin chemotherapy.   ondansetron 8 MG disintegrating tablet Commonly known as: ZOFRAN-ODT Take 8 mg by mouth.   OPCON-A OP Place 2 drops into both eyes as needed (for dry eyes).   potassium chloride SA 20 MEQ tablet Commonly known as: KLOR-CON Take 1 tablet (20 mEq total) by  mouth daily.   prochlorperazine 10 MG tablet Commonly known as: COMPAZINE Take 1 tablet (10 mg total) by mouth every 6 (six) hours as needed (Nausea or vomiting).   tamsulosin 0.4 MG Caps capsule Commonly known as: FLOMAX Take 1 capsule (0.4 mg total) by mouth daily.       Allergies: No Known Allergies  Past Medical History, Surgical history, Social history, and Family History were reviewed and updated.  Review of Systems: Review of Systems  Constitutional: Positive for weight loss.  HENT: Negative.   Eyes: Negative.   Respiratory: Negative.   Cardiovascular: Negative.   Gastrointestinal: Negative.   Genitourinary: Negative.   Musculoskeletal: Negative.   Skin: Negative.   Neurological: Negative.   Endo/Heme/Allergies: Negative.   Psychiatric/Behavioral: Negative.      Physical Exam:  weight is 158 lb (71.7 kg). His oral temperature is 98.2 F (36.8 C). His blood pressure is 111/72 and his pulse is 89. His respiration is 17 and oxygen saturation is 100%.   Wt Readings from Last 3 Encounters:  07/20/20 158 lb (71.7 kg)  07/16/20 158 lb 9.6 oz (71.9 kg)  07/06/20 175 lb (79.4 kg)    Physical Exam Vitals reviewed.  HENT:     Head: Normocephalic and atraumatic.  Eyes:     Pupils: Pupils are equal, round, and reactive to light.  Cardiovascular:     Rate and Rhythm: Normal rate and regular rhythm.     Heart sounds: Normal heart sounds.  Pulmonary:     Effort: Pulmonary effort is normal.     Breath sounds: Normal breath sounds.  Abdominal:     General: Bowel sounds are normal.     Palpations: Abdomen is soft.  Musculoskeletal:        General: No tenderness or deformity. Normal range of motion.     Cervical back: Normal range of motion.  Lymphadenopathy:     Cervical: No cervical adenopathy.  Skin:    General: Skin is warm and dry.     Findings: No erythema or rash.  Neurological:     Mental Status: He is alert and oriented to person, place, and time.   Psychiatric:        Behavior: Behavior normal.        Thought Content: Thought content normal.        Judgment: Judgment normal.      Lab Results  Component Value Date   WBC 4.1 07/20/2020   HGB 9.3 (L) 07/20/2020   HCT 26.6 (L) 07/20/2020   MCV 95.0 07/20/2020   PLT 118 (L) 07/20/2020   Lab Results  Component Value Date   FERRITIN 2,549 (H) 10/21/2019   IRON 43 10/21/2019   TIBC 225 10/21/2019   UIBC 182  10/21/2019   IRONPCTSAT 19 (L) 10/21/2019   Lab Results  Component Value Date   RETICCTPCT 1.5 08/07/2019   RBC 2.80 (L) 07/20/2020   No results found for: KPAFRELGTCHN, LAMBDASER, KAPLAMBRATIO No results found for: IGGSERUM, IGA, IGMSERUM No results found for: Kathrynn Ducking, MSPIKE, SPEI   Chemistry      Component Value Date/Time   NA 135 07/13/2020 0834   NA 142 01/30/2017 0804   K 3.9 07/13/2020 0834   K 4.1 01/30/2017 0804   CL 100 07/13/2020 0834   CL 105 01/25/2016 0926   CO2 26 07/13/2020 0834   CO2 29 01/30/2017 0804   BUN 12 07/13/2020 0834   BUN 6.9 (L) 01/30/2017 0804   CREATININE 1.23 07/13/2020 0834   CREATININE 1.40 (H) 07/06/2020 0800   CREATININE 0.8 01/30/2017 0804      Component Value Date/Time   CALCIUM 9.7 07/13/2020 0834   CALCIUM 10.5 (H) 01/30/2017 0804   ALKPHOS 71 07/13/2020 0834   ALKPHOS 79 01/30/2017 0804   AST 12 (L) 07/13/2020 0834   AST 13 (L) 07/06/2020 0800   AST 34 01/30/2017 0804   ALT 7 07/13/2020 0834   ALT 9 07/06/2020 0800   ALT 18 01/30/2017 0804   BILITOT 0.6 07/13/2020 0834   BILITOT 0.4 07/06/2020 0800   BILITOT 1.49 (H) 01/30/2017 0804       Impression and Plan: Mr. Ashmore is a very pleasant 70 yo caucasian gentleman with history of stage 1b melanoma of the left lower leg.   This really is not a problem right now.  He now has extensive stage small cell lung cancer.  We will go ahead with his day 8 of the fifth cycle of chemotherapy.  I will probably have to  rescan him after this cycle.  Hopefully, by omitting the irinotecan on day 8, we will be able to help with his blood counts and try to keep him on schedule.  I will plan to get him back to see me in another 3 weeks.   Volanda Napoleon, MD 2/14/20228:37 AM

## 2020-07-20 NOTE — Patient Instructions (Signed)

## 2020-07-20 NOTE — Progress Notes (Signed)
Oncology Nurse Navigator Documentation  Oncology Nurse Navigator Flowsheets 07/20/2020  Abnormal Finding Date -  Confirmed Diagnosis Date -  Diagnosis Status -  Planned Course of Treatment -  Phase of Treatment -  Chemotherapy Actual Start Date: -  Radiation Actual Start Date: -  Navigator Follow Up Date: 08/10/2020  Navigator Follow Up Reason: Follow-up Appointment;Chemotherapy  Navigator Location [facility]  Navigator Encounter Type Treatment;Appt/Treatment Plan Review  Telephone -  Treatment Initiated Date -  Patient Visit Type MedOnc  Treatment Phase Active Tx  Barriers/Navigation Needs Coordination of Care;Education  Education -  Interventions Psycho-Social Support  Acuity Level 2-Minimal Needs (1-2 Barriers Identified)  Referrals -  Coordination of Care -  Education Method -  Support Groups/Services Friends and Family  Time Spent with Patient 30

## 2020-07-20 NOTE — Progress Notes (Signed)
Per Dr. Marin Olp ok to proceed with premeds and chemotherapy with only 125 ml of urine output.

## 2020-07-22 ENCOUNTER — Other Ambulatory Visit: Payer: Self-pay

## 2020-07-22 ENCOUNTER — Encounter: Payer: Self-pay | Admitting: Radiation Oncology

## 2020-07-22 ENCOUNTER — Ambulatory Visit
Admission: RE | Admit: 2020-07-22 | Discharge: 2020-07-22 | Disposition: A | Discharge status: Home / Self Care | Payer: Medicare Other | Source: Ambulatory Visit | Attending: Radiation Oncology | Admitting: Radiation Oncology

## 2020-07-22 VITALS — BP 124/66 | HR 80 | Temp 97.7°F | Resp 18

## 2020-07-22 DIAGNOSIS — C3490 Malignant neoplasm of unspecified part of unspecified bronchus or lung: Secondary | ICD-10-CM

## 2020-07-22 DIAGNOSIS — Z510 Encounter for antineoplastic radiation therapy: Secondary | ICD-10-CM | POA: Diagnosis not present

## 2020-07-22 DIAGNOSIS — C7931 Secondary malignant neoplasm of brain: Secondary | ICD-10-CM | POA: Diagnosis not present

## 2020-07-22 NOTE — Progress Notes (Signed)
Patient here in clinic today after [place] Tx. Patient denies having any nausea/ vomiting, dizziness or headache. Advised no heavy lifting for 24 hours and if any of these symptoms should occur to contact the cancer center at [phone] and request the radiation oncologist.

## 2020-07-22 NOTE — Progress Notes (Signed)
  Radiation Oncology         ([phone]) [phone] ________________________________  Stereotactic Treatment Procedure Note  Name: Mclane Arora MRN: [id number]  Date: 07/22/2020  DOB: [birth year]  SPECIAL TREATMENT PROCEDURE    ICD-10-CM   1. Brain metastases ([place])  C79.31   2. Extensive stage primary small cell carcinoma of lung ([facility])  C34.90     3D TREATMENT PLANNING AND DOSIMETRY:  The patient's radiation plan was reviewed and approved by neurosurgery and radiation oncology prior to treatment.  It showed 3-dimensional radiation distributions overlaid onto the planning CT/MRI image set.  The [facility] for the target structures as well as the organs at risk were reviewed. The documentation of the 3D plan and dosimetry are filed in the radiation oncology EMR.  NARRATIVE:  Odin Mariani was brought to the TrueBeam stereotactic radiation treatment machine and placed supine on the CT couch. The head frame was applied, and the patient was set up for stereotactic radiosurgery.  Neurosurgery was present for the set-up and delivery  SIMULATION VERIFICATION:  In the couch zero-angle position, the patient underwent Exactrac imaging using the Brainlab system with orthogonal KV images.  These were carefully aligned and repeated to confirm treatment position for each of the isocenters.  The Exactrac snap film verification was repeated at each couch angle.  PROCEDURE: [name] received stereotactic radiosurgery to the following targets:   These six distinct brain metastases were treated using single isocenter 8 Rapid Arc VMAT Beams to a prescription dose of 18-20 Gy.  ExacTrac registration was performed for each couch angle.  The 100% isodose line was prescribed.  6 MV X-rays were delivered in the flattening filter free beam mode.  STEREOTACTIC TREATMENT MANAGEMENT:  Following delivery, the patient was transported to nursing in stable condition and monitored for possible acute effects.  Vital signs were recorded BP  124/66 (BP Location: Left Arm)   Pulse 80   Temp 97.7 F (36.5 C) (Oral)   Resp 18   SpO2 100% . The patient tolerated treatment without significant acute effects, and was discharged to home in stable condition.    PLAN: Follow-up in one month.  ________________________________  Sheral Apley. Tammi Klippel, M.D.

## 2020-07-23 NOTE — Op Note (Signed)
  Name: Walter Hernandez  MRN: [id number]  Date: 07/22/2020   DOB: 1951-02-12  Stereotactic Radiosurgery Operative Note  PRE-OPERATIVE DIAGNOSIS:  Multiple Brain Metastases  POST-OPERATIVE DIAGNOSIS:  Multiple Brain Metastases  PROCEDURE:  Stereotactic Radiosurgery  SURGEON:  Peggyann Shoals, MD  NARRATIVE: The patient underwent a radiation treatment planning session in the radiation oncology simulation suite under the care of the radiation oncology physician and physicist.  I participated closely in the radiation treatment planning afterwards. The patient underwent planning CT which was fused to 3T high resolution MRI with 1 mm axial slices.  These images were fused on the planning system.  We contoured the gross target volumes and subsequently expanded this to yield the Planning Target Volume. I actively participated in the planning process.  I helped to define and review the target contours and also the contours of the optic pathway, eyes, brainstem and selected nearby organs at risk.  All the dose constraints for critical structures were reviewed and compared to AAPM Task Group 101.  The prescription dose conformity was reviewed.  I approved the plan electronically.    Accordingly, [name] was brought to the TrueBeam stereotactic radiation treatment linac and placed in the custom immobilization mask.  The patient was aligned according to the IR fiducial markers with BrainLab Exactrac, then orthogonal x-rays were used in ExacTrac with the 6DOF robotic table and the shifts were made to align the patient  [name] received stereotactic radiosurgery uneventfully.    Lesions treated:  6   Complex lesions treated:  1 (>3.5 cm, <75mm of optic path, or within the brainstem)   The detailed description of the procedure is recorded in the radiation oncology procedure note.  I was present for the duration of the procedure.  DISPOSITION:  Following delivery, the patient was transported to nursing in  stable condition and monitored for possible acute effects to be discharged to home in stable condition with follow-up in one month.  Peggyann Shoals, MD 07/23/2020 7:25 AM

## 2020-07-23 NOTE — Addendum Note (Signed)
Encounter addended by: Erline Levine, MD on: 07/23/2020 7:25 AM  Actions taken: Clinical Note Signed

## 2020-07-27 ENCOUNTER — Telehealth: Payer: Self-pay | Admitting: Radiation Therapy

## 2020-07-27 ENCOUNTER — Telehealth: Payer: Self-pay | Admitting: Radiation Oncology

## 2020-07-27 ENCOUNTER — Other Ambulatory Visit: Payer: Self-pay | Admitting: Radiation Oncology

## 2020-07-27 MED ORDER — DEXAMETHASONE 4 MG PO TABS: ORAL_TABLET | ORAL | 0 refills | Status: DC

## 2020-07-27 NOTE — Telephone Encounter (Signed)
Phoned patient's home. Spoke with wife, Colletta Maryland. Explained decadron taper has been prescribed to their preferred pharmacy. Reviewed directions for use. Encouraged patient take his prilosec as directed each day he is on steroids. Colletta Maryland verbalized understanding and appreciation for the call.

## 2020-07-27 NOTE — Telephone Encounter (Addendum)
Mr. Daviyon wife called to share some concerns she has about her husband's behavior over the weekend. She said that he has been very tired and does not want to get up out of the bed. She was expecting some fatigue, but nothing like he has been over the past several days. He has not gotten up to eat anything for three days. On Saturday morning, he tried to get up out of the bed, swung his legs off the side, but then just laid back down leaving half his body off the bed. He was very hard to awaken and did not respond or speak to her for a couple minutes. Frightened, she called 911 to check him. His vitals were all normal, he was not confused or complaining of headache, nausea, dizziness, change in vision, or any other new CNS symptoms, so he did not go to the hospital for further workup.   Mrs. Hisham called me to see if this is normal or expected behavior and wants to know what she should do. I shared that fatigue is very normal and the fact that he has not eaten anything for several days is probably adding to this. Again, she denied him having any headache, nausea, confusion, change in vision, or dizziness. He is drinking water and continuing to go to the bathroom. I encouraged her to get him to eat something and asked her to call back if he continues to not eat or has any new symptoms. I will check back with them this afternoon and also forward this note to Dr. Tammi Klippel and his nurse Sam for further recommendations.    Mont Dutton R.T.(R)(T) Radiation Special Procedures Navigator    Follow-up from Dr. Tammi Klippel was for the patient to begin a course of steroids to treat potential edema/swelling. This has been called into his pharmacy and instructions shared with the patient's wife.   Dexamethasone 4 mg TID x 2 days, BID x 2 days, then 2 mg BID x 7 days, then 2 mg QD x 7 days then stop.    Mont Dutton

## 2020-08-03 NOTE — Progress Notes (Signed)
  Radiation Oncology         [phone]) [phone] ________________________________  Name: Walter Hernandez MRN: [id number]  Date: 07/22/2020  DOB: 07/25/1950  End of Treatment Note  Diagnosis:   70 yo man with brain metastases from small cell lung cancer s/p previous whole brain radiotherapy.     Indication for treatment:  Palliation       Radiation treatment dates:   07/22/20  Site/dose/beams/energy:    Walter Hernandez received stereotactic radiosurgery to the following targets:   These six distinct brain metastases were treated using single isocenter 8 Rapid Arc VMAT Beams to a prescription dose of 18-20 Gy.  ExacTrac registration was performed for each couch angle.  The 100% isodose line was prescribed.  6 MV X-rays were delivered in the flattening filter free beam mode.   Narrative: The patient tolerated radiation treatment relatively well.     Plan: The patient has completed radiation treatment. The patient will return to radiation oncology clinic for routine followup in one month. I advised him to call or return sooner if he has any questions or concerns related to his recovery or treatment. ________________________________  Sheral Apley. Tammi Klippel, M.D.

## 2020-08-06 ENCOUNTER — Telehealth: Payer: Self-pay | Admitting: Radiation Therapy

## 2020-08-06 NOTE — Telephone Encounter (Signed)
Walter Hernandez called to report that Walter Hernandez is doing much better since starting the steroid prescription. He has more energy and is eating again. However, she is concerned about the cognitive changes noticed since having his treatment. He has become forgetful, they had to reset all of his online passwords because he could no longer remember them.   Walter Hernandez mentioned that during the whole brain course, [name] was given a prescription for Namenda. She is asking if it would help if he restarted this medication, or if there is anything else to recommend for improving his current state.    Mont Dutton R.T.(R)(T)

## 2020-08-07 ENCOUNTER — Encounter: Payer: Self-pay | Admitting: Internal Medicine

## 2020-08-07 ENCOUNTER — Other Ambulatory Visit: Payer: Self-pay | Admitting: Radiation Therapy

## 2020-08-07 ENCOUNTER — Telehealth: Payer: Self-pay | Admitting: Radiation Therapy

## 2020-08-07 ENCOUNTER — Inpatient Hospital Stay: Payer: Medicare Other | Attending: Hematology & Oncology | Admitting: Internal Medicine

## 2020-08-07 ENCOUNTER — Other Ambulatory Visit: Payer: Self-pay

## 2020-08-07 ENCOUNTER — Ambulatory Visit (HOSPITAL_COMMUNITY)
Admission: RE | Admit: 2020-08-07 | Discharge: 2020-08-07 | Disposition: A | Discharge status: Home / Self Care | Payer: Medicare Other | Source: Ambulatory Visit | Attending: Hematology & Oncology | Admitting: Hematology & Oncology

## 2020-08-07 VITALS — BP 138/67 | HR 68 | Temp 97.2°F | Resp 16 | Ht 67.0 in | Wt 151.8 lb

## 2020-08-07 DIAGNOSIS — Z79899 Other long term (current) drug therapy: Secondary | ICD-10-CM | POA: Diagnosis not present

## 2020-08-07 DIAGNOSIS — C3431 Malignant neoplasm of lower lobe, right bronchus or lung: Secondary | ICD-10-CM | POA: Insufficient documentation

## 2020-08-07 DIAGNOSIS — Z87891 Personal history of nicotine dependence: Secondary | ICD-10-CM | POA: Insufficient documentation

## 2020-08-07 DIAGNOSIS — Z5111 Encounter for antineoplastic chemotherapy: Secondary | ICD-10-CM | POA: Insufficient documentation

## 2020-08-07 DIAGNOSIS — C3490 Malignant neoplasm of unspecified part of unspecified bronchus or lung: Secondary | ICD-10-CM | POA: Diagnosis not present

## 2020-08-07 DIAGNOSIS — R410 Disorientation, unspecified: Secondary | ICD-10-CM | POA: Diagnosis not present

## 2020-08-07 DIAGNOSIS — C7931 Secondary malignant neoplasm of brain: Secondary | ICD-10-CM | POA: Diagnosis not present

## 2020-08-07 DIAGNOSIS — C4372 Malignant melanoma of left lower limb, including hip: Secondary | ICD-10-CM | POA: Insufficient documentation

## 2020-08-07 DIAGNOSIS — K219 Gastro-esophageal reflux disease without esophagitis: Secondary | ICD-10-CM | POA: Diagnosis not present

## 2020-08-07 DIAGNOSIS — K7689 Other specified diseases of liver: Secondary | ICD-10-CM | POA: Diagnosis not present

## 2020-08-07 DIAGNOSIS — I999 Unspecified disorder of circulatory system: Secondary | ICD-10-CM | POA: Diagnosis not present

## 2020-08-07 LAB — GLUCOSE, CAPILLARY: Glucose-Capillary: 88 mg/dL (ref 70–99)

## 2020-08-07 IMAGING — CT NM PET TUM IMG RESTAG (PS) SKULL BASE T - THIGH
1 of 7 series · 1 of 25 positions shown · non-contrast
Comparison: Multiple prior PET CTs.  The most recent is [DATE].

CLINICAL DATA: Subsequent treatment strategy for small cell lung
cancer.

EXAM:
NUCLEAR MEDICINE PET SKULL BASE TO THIGH
TECHNIQUE: 7.7 mCi F-18 FDG was injected intravenously. Full-ring PET imaging
was performed from the skull base to thigh after the radiotracer. CT
data was obtained and used for attenuation correction and anatomic
localization.
Fasting blood glucose: 88 mg/dl

[Series 4: ct sk_thigh 5.0 bf37 · axial · 5.0mm · 0.98mm/px · 1 of 234 slices shown]
[im 234/234  brain]
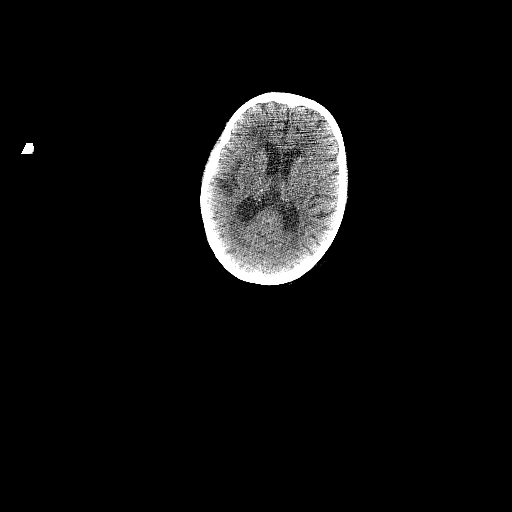

[1 of 25 positions shown; findings below may reference images not displayed]

FINDINGS: Mediastinal blood pool activity: SUV max

Liver activity: SUV max NA

NECK: No neck mass or lymphadenopathy. No residual or recurrent
nodal disease in the right supraclavicular fossa.

Incidental CT findings: Stable advanced bilateral coronary artery
calcifications.

CHEST: No residual or recurrent mediastinal or hilar
lymphadenopathy. No worrisome pulmonary nodules to suggest pulmonary
metastatic disease. No axillary adenopathy.

Incidental CT findings: Stable vascular calcifications.

ABDOMEN/PELVIS: There are few small residual low-attenuation lesions
in the liver the 12 mm segment 7 lesion is unchanged. No residual
hypermetabolism is demonstrated. No new hepatic lesions.

No enlarged or hypermetabolic abdominal or pelvic lymphadenopathy.
No adrenal gland lesions.

Incidental CT findings: Stable vascular calcifications.

SKELETON: No findings suspicious for osseous metastatic disease.

Incidental CT findings: none
IMPRESSION: 1. Negative PET-CT. No findings for residual or recurrent tumor or
metastatic disease. Findings suggest an excellent response to
treatment with complete metabolic response.
2. Stable vascular disease.

## 2020-08-07 MED ORDER — FLUDEOXYGLUCOSE F - 18 (FDG) INJECTION
7.7000 | Freq: Once | INTRAVENOUS | Status: AC | PRN
  Administered 2020-08-07: 7.7 via INTRAVENOUS

## 2020-08-07 NOTE — Progress Notes (Signed)
Walter Hernandez at [place], [place] [address] [phone]   New Patient Evaluation  Date of Service: 08/07/20 Patient Name: Walter Hernandez Patient MRN: [id number] Patient DOB: 1950-10-27 Provider: Ventura Sellers, MD  Identifying Statement:  Walter Hernandez is a 70 y.o. male with Brain metastases ([place]) [C79.31] who presents for initial consultation and evaluation regarding cancer associated neurologic deficits.    Referring Provider: Tyler Pita, MD [address] [place],   [id number]  Primary Cancer:  Oncologic History: Oncology History  Extensive stage primary small cell carcinoma of lung ([place])  06/11/2019 Initial Diagnosis   Extensive stage primary small cell carcinoma of lung ([place])   07/08/2019 - 10/01/2019 Chemotherapy   The patient had palonosetron (ALOXI) injection 0.25 mg, 0.25 mg, Intravenous,  Once, 4 of 4 cycles Administration: 0.25 mg (07/08/2019), 0.25 mg (07/29/2019), 0.25 mg (08/20/2019), 0.25 mg (09/10/2019) pegfilgrastim-jmdb (FULPHILA) injection 6 mg, 6 mg, Subcutaneous,  Once, 4 of 4 cycles Administration: 6 mg (07/12/2019), 6 mg (08/01/2019), 6 mg (08/23/2019), 6 mg (09/13/2019) CARBOplatin (PARAPLATIN) 440 mg in sodium chloride 0.9 % 250 mL chemo infusion, 440 mg (100 % of original dose 441 mg), Intravenous,  Once, 4 of 4 cycles Dose modification:   (original dose 441 mg, Cycle 1) Administration: 440 mg (07/08/2019), 440 mg (07/29/2019), 440 mg (08/20/2019), 440 mg (09/10/2019) etoposide (VEPESID) 180 mg in sodium chloride 0.9 % 500 mL chemo infusion, 100 mg/m2 = 180 mg, Intravenous,  Once, 4 of 4 cycles Dose modification: 80 mg/m2 (80 % of original dose 100 mg/m2, Cycle 3, Reason: Provider Judgment), 90 mg/m2 (90 % of original dose 100 mg/m2, Cycle 3, Reason: Provider Judgment) Administration: 180 mg (07/08/2019), 180 mg (07/09/2019), 180 mg (07/10/2019), 180 mg (07/29/2019), 180 mg (07/30/2019), 180 mg (07/31/2019), 160 mg  (08/20/2019), 160 mg (08/21/2019), 160 mg (08/22/2019), 160 mg (09/10/2019), 160 mg (09/11/2019), 160 mg (09/12/2019) fosaprepitant (EMEND) 150 mg in sodium chloride 0.9 % 145 mL IVPB, 150 mg, Intravenous,  Once, 4 of 4 cycles Administration: 150 mg (07/08/2019), 150 mg (07/29/2019), 150 mg (08/20/2019), 150 mg (09/10/2019) atezolizumab (TECENTRIQ) 1,200 mg in sodium chloride 0.9 % 250 mL chemo infusion, 1,200 mg, Intravenous, Once, 5 of 8 cycles Administration: 1,200 mg (07/08/2019), 1,200 mg (07/29/2019), 1,200 mg (08/20/2019), 1,200 mg (09/10/2019), 1,200 mg (10/01/2019)  for chemotherapy treatment.    10/28/2019 - 03/02/2020 Chemotherapy   The patient had dexamethasone (DECADRON) 4 MG tablet, 8 mg, Oral, Daily, 1 of 1 cycle, Start date: --, End date: -- palonosetron (ALOXI) injection 0.25 mg, 0.25 mg, Intravenous,  Once, 7 of 11 cycles Administration: 0.25 mg (10/28/2019), 0.25 mg (11/18/2019), 0.25 mg (12/10/2019), 0.25 mg (12/30/2019), 0.25 mg (01/20/2020), 0.25 mg (02/11/2020), 0.25 mg (03/02/2020) pembrolizumab (KEYTRUDA) 200 mg in sodium chloride 0.9 % 50 mL chemo infusion, 200 mg (100 % of original dose 200 mg), Intravenous, Once, 7 of 11 cycles Dose modification: 200 mg (original dose 200 mg, Cycle 1, Reason: Provider Judgment, Comment: flat dose; confirmed w/ MD) Administration: 200 mg (10/28/2019), 200 mg (11/18/2019), 200 mg (12/10/2019), 200 mg (12/30/2019), 200 mg (01/20/2020), 200 mg (02/11/2020), 200 mg (03/02/2020) lurbinectedin (ZEPZELCA) 5.55 mg in sodium chloride 0.9 % 250 mL chemo infusion, 3.2 mg/m2 = 5.55 mg, Intravenous,  Once, 7 of 11 cycles Administration: 5.55 mg (10/28/2019), 5.55 mg (11/18/2019), 5.55 mg (12/10/2019), 5.55 mg (12/30/2019), 5.55 mg (01/20/2020), 5.55 mg (02/11/2020), 5.55 mg (03/02/2020)  for chemotherapy treatment.    03/23/2020 -  Chemotherapy  Patient is on Treatment Plan: LUNG SMALL CELL CISPLATIN + IRINOTECAN D1,8 Q21D       CNS Oncologic History 07/02/19: Completes WBRT  Walter Hernandez) 07/22/20: Salvage SRS to 6 additional metastases   History of Present Illness: The patient's records from the referring physician were obtained and reviewed and the patient interviewed to confirm this HPI.  Walter Hernandez presents today for evaluation after recent clinical event.  His wife describes episode of sudden loss of awareness, "staring into space" which was appreciated after being seen normal just minutes prior.  This was followed by period of confusion upon return of awareness, the confusion lasted for minutes or even an hour, possibly.  Since that time he has been in normal state of mentation.  No other neurologic deficits accompanied the event.  He does describe loss of short term memory over the past year, his wife has to help him remember things around the home and passwords for the computer.  No issues with gait/walking.  Currently dosing cisplatin and CPT-11 with Dr. Marin Olp for progressive small cell carcinoma.    Medications: Current Outpatient Medications on File Prior to Visit  Medication Sig Dispense Refill  . acetaminophen (TYLENOL) 500 MG tablet Take 1,000 mg by mouth every 6 (six) hours as needed for moderate pain.    Marland Kitchen ALPRAZolam (XANAX) 0.5 MG tablet Take 1 tablet (0.5 mg total) by mouth every 6 (six) hours as needed for anxiety. 60 tablet 0  . dexamethasone (DECADRON) 4 MG tablet Dexamethasone 4 mg TID x 2 days, BID x 2 days, then 2 mg (1/2 tab) BID x 7 days, then 2 mg (1/2 tab) QD x 7 days then stop. 30 tablet 0  . diphenoxylate-atropine (LOMOTIL) 2.5-0.025 MG tablet Take 2 tablets at onset of diarrhea. Take one tablet after each loose stool. Max 8 tablets per day. 60 tablet 2  . dronabinol (MARINOL) 5 MG capsule Take 1 capsule (5 mg total) by mouth 2 (two) times daily before a meal. 60 capsule 2  . finasteride (PROSCAR) 5 MG tablet TAKE 1 TABLET BY MOUTH EVERY DAY 90 tablet 1  . lidocaine-prilocaine (EMLA) cream Apply to affected area once 30 g 3  . loperamide  (IMODIUM) 2 MG capsule     . LORazepam (ATIVAN) 0.5 MG tablet Take 1 tablet (0.5 mg total) by mouth every 6 (six) hours as needed (Nausea or vomiting). 30 tablet 0  . Multiple Vitamin (MULTIVITAMIN) capsule Take 1 capsule by mouth daily.    . Naphazoline-Pheniramine (OPCON-A OP) Place 2 drops into both eyes as needed (for dry eyes).    Marland Kitchen omeprazole (PRILOSEC) 40 MG capsule Take 1 capsule (40 mg total) by mouth 2 (two) times daily. 180 capsule 3  . ondansetron (ZOFRAN) 4 MG tablet Take 1 tablet (4 mg total) by mouth every 6 (six) hours as needed for nausea. 20 tablet 0  . ondansetron (ZOFRAN) 8 MG tablet Take 1 tablet (8 mg total) by mouth 2 (two) times daily as needed. Start on the third day after cisplatin chemotherapy. 30 tablet 1  . ondansetron (ZOFRAN-ODT) 8 MG disintegrating tablet Take 8 mg by mouth.    . prochlorperazine (COMPAZINE) 10 MG tablet Take 1 tablet (10 mg total) by mouth every 6 (six) hours as needed (Nausea or vomiting). 30 tablet 1  . tamsulosin (FLOMAX) 0.4 MG CAPS capsule Take 1 capsule (0.4 mg total) by mouth daily. 60 capsule 1  . cholestyramine (QUESTRAN) 4 g packet Take 1 packet (4 g total) by  mouth 3 (three) times daily with meals. (Patient not taking: Reported on 08/07/2020) 60 each 12  . furosemide (LASIX) 20 MG tablet Take 2 tablets (40 mg total) by mouth daily. (Patient not taking: Reported on 08/07/2020) 30 tablet 5  . memantine (NAMENDA) 10 MG tablet Take 10 mg by mouth daily. (Patient not taking: Reported on 08/07/2020)    . nicotine (NICODERM CQ - DOSED IN MG/24 HOURS) 21 mg/24hr patch PLACE 1 PATCH (21 MG TOTAL) ONTO THE SKIN DAILY. (Patient not taking: Reported on 08/07/2020) 28 patch 2  . potassium chloride SA (KLOR-CON) 20 MEQ tablet Take 1 tablet (20 mEq total) by mouth daily. (Patient not taking: Reported on 08/07/2020) 30 tablet 4   Current Facility-Administered Medications on File Prior to Visit  Medication Dose Route Frequency Provider Last Rate Last Admin  .  sodium chloride flush (NS) 0.9 % injection 10 mL  10 mL Intravenous PRN Volanda Napoleon, MD   10 mL at 10/21/19 0912    Allergies: No Known Allergies Past Medical History:  Past Medical History:  Diagnosis Date  . Cancer [facility])    recently discovered melanoma  . GERD (gastroesophageal reflux disease)   . Goals of care, counseling/discussion 06/19/2019  . History of hiatal hernia   . Hypertension    recently placed on new blood pressure med  . Malignant melanoma of skin of left lower leg ([place]) 07/28/2015   Past Surgical History:  Past Surgical History:  Procedure Laterality Date  . BRONCHIAL NEEDLE ASPIRATION BIOPSY  06/05/2019   Procedure: Bronchial Needle Aspiration Biopsies;  Surgeon: Candee Furbish, MD;  Location: [facility];  Service: Thoracic;;  . CARDIAC CATHETERIZATION     15 yrs ago...came out normal   -- hiatal hernia  . COLONOSCOPY  2012   Dr Earlean Shawl   . ENDOBRONCHIAL ULTRASOUND N/A 06/11/2019   Procedure: ENDOBRONCHIAL ULTRASOUND;  Surgeon: Garner Nash, DO;  Location: [facility];  Service: Endoscopy;  Laterality: N/A;  . ESOPHAGOGASTRODUODENOSCOPY  2012   Dr Earlean Shawl. has a really bad hiatal hernia   . FINE NEEDLE ASPIRATION  06/11/2019   Procedure: FINE NEEDLE ASPIRATION (FNA) LINEAR;  Surgeon: Garner Nash, DO;  Location: [facility];  Service: Endoscopy;;  . IR IMAGING GUIDED PORT INSERTION  07/03/2019  . MELANOMA EXCISION WITH SENTINEL LYMPH NODE BIOPSY Left 09/29/2014   Procedure: WIDE EXCISION MELANOMA LEFT CALF WITH LEFT INGUINAL  SENTINEL LYMPH NODE BIOPSY;  Surgeon: Georganna Skeans, MD;  Location: [facility];  Service: General;  Laterality: Left;  . skin lesion excised     non cancerous  . VIDEO BRONCHOSCOPY N/A 06/11/2019   Procedure: VIDEO BRONCHOSCOPY WITHOUT FLUORO;  Surgeon: Garner Nash, DO;  Location: [facility];  Service: Endoscopy;  Laterality: N/A;  . VIDEO BRONCHOSCOPY WITH ENDOBRONCHIAL ULTRASOUND N/A 06/05/2019   Procedure: VIDEO BRONCHOSCOPY WITH  ENDOBRONCHIAL ULTRASOUND;  Surgeon: Candee Furbish, MD;  [facility];  Service: Thoracic;  Laterality: N/A;   Social History:  Social History   Socioeconomic History  . Marital status: Married    Spouse name: Not on file  . Number of children: Not on file  . Years of education: Not on file  . Highest education level: Not on file  Occupational History  . Not on file  Tobacco Use  . Smoking status: Former Smoker    Packs/day: 1.00    Years: 50.00    Pack years: 50.00    Types: Cigarettes  . Smokeless tobacco: Never Used  .  Tobacco comment: quit  06/03/2019  Vaping Use  . Vaping Use: Never used  Substance and Sexual Activity  . Alcohol use: Not Currently    Alcohol/week: 0.0 standard drinks    Comment: socially....beer  wine & liquor  . Drug use: No  . Sexual activity: Not on file  Other Topics Concern  . Not on file  Social History Narrative  . Not on file   Social Determinants of Health   Financial Resource Strain: Not on file  Food Insecurity: Not on file  Transportation Needs: Not on file  Physical Activity: Not on file  Stress: Not on file  Social Connections: Not on file  Intimate Partner Violence: Not on file   Family History:  Family History  Problem Relation Age of Onset  . Colon cancer Neg Hx   . Esophageal cancer Neg Hx     Review of Systems: Constitutional: Doesn't report fevers, chills or abnormal weight loss Eyes: Doesn't report blurriness of vision Ears, nose, mouth, throat, and face: Doesn't report sore throat Respiratory: Doesn't report cough, dyspnea or wheezes Cardiovascular: Doesn't report palpitation, chest discomfort  Gastrointestinal:  Doesn't report nausea, constipation, diarrhea GU: Doesn't report incontinence Skin: Doesn't report skin rashes Neurological: Per HPI Musculoskeletal: Doesn't report joint pain Behavioral/Psych: Doesn't report anxiety  Physical Exam: Vitals:   08/07/20 0954  BP: 138/67  Pulse: 68  Resp: 16   Temp: (!) 97.2 F (36.2 C)  SpO2: 100%   KPS: 80. General: Alert, cooperative, pleasant, in no acute distress Head: Normal EENT: No conjunctival injection or scleral icterus.  Lungs: Resp effort normal Cardiac: Regular rate Abdomen: Non-distended abdomen Skin: No rashes cyanosis or petechiae. Extremities: No clubbing or edema  Neurologic Exam: Mental Status: Awake, alert, attentive to examiner. Oriented to self and environment. Language is fluent with intact comprehension.  Age advanced psychomotor slowing, delayed recall. Cranial Nerves: Visual acuity is grossly normal. Visual fields are full. Extra-ocular movements intact. No ptosis. Face is symmetric Motor: Tone and bulk are normal. Power is full in both arms and legs. Reflexes are symmetric, no pathologic reflexes present.  Sensory: Intact to light touch Gait: Normal.   Labs: I have reviewed the data as listed    Component Value Date/Time   NA 133 (L) 07/20/2020 0813   NA 142 01/30/2017 0804   K 4.1 07/20/2020 0813   K 4.1 01/30/2017 0804   CL 97 (L) 07/20/2020 0813   CL 105 01/25/2016 0926   CO2 27 07/20/2020 0813   CO2 29 01/30/2017 0804   GLUCOSE 106 (H) 07/20/2020 0813   GLUCOSE 105 01/30/2017 0804   GLUCOSE 113 01/25/2016 0926   BUN 20 07/20/2020 0813   BUN 6.9 (L) 01/30/2017 0804   CREATININE 1.37 (H) 07/20/2020 0813   CREATININE 0.8 01/30/2017 0804   CALCIUM 9.6 07/20/2020 0813   CALCIUM 10.5 (H) 01/30/2017 0804   PROT 6.2 (L) 07/20/2020 0813   PROT 7.9 01/30/2017 0804   ALBUMIN 4.2 07/20/2020 0813   ALBUMIN 4.5 01/30/2017 0804   AST 14 (L) 07/20/2020 0813   AST 34 01/30/2017 0804   ALT 11 07/20/2020 0813   ALT 18 01/30/2017 0804   ALKPHOS 67 07/20/2020 0813   ALKPHOS 79 01/30/2017 0804   BILITOT 0.5 07/20/2020 0813   BILITOT 1.49 (H) 01/30/2017 0804   GFRNONAA 55 (L) 07/20/2020 0813   GFRAA >60 03/02/2020 1135   Lab Results  Component Value Date   WBC 4.1 07/20/2020   NEUTROABS 2.2 07/20/2020  HGB 9.3 (L) 07/20/2020   HCT 26.6 (L) 07/20/2020   MCV 95.0 07/20/2020   PLT 118 (L) 07/20/2020    Imaging:  MR Brain W Wo Contrast  Result Date: 07/10/2020 CLINICAL DATA:  Brain/CNS neoplasm.  Surveillance. EXAM: MRI HEAD WITHOUT AND WITH CONTRAST TECHNIQUE: Multiplanar, multiecho pulse sequences of the brain and surrounding structures were obtained without and with intravenous contrast. CONTRAST:  7.24mL GADAVIST GADOBUTROL 1 MMOL/ML IV SOLN COMPARISON:  MRI of the brain April 09, 2020. FINDINGS: Brain: Enhancing lesions labeled on postcontrast axial T1 images (series 1100): Larger lesions: Left superior frontal gyrus, 6 mm, image 254; patchy enhancing lesion in the right pre frontal gyrus, 7-8 mm, image 232. New lesions: Right frontal lobe, 12 mm, image 210; Medial right thalamus, 10 mm, image 176; Midbrain on the right side, 6 mm, image 169; Left temporal lobe, 12 mm, image 170; Left occipital lobe, 1 mm, image 153; Left cerebellar hemisphere, 2 mm, image 126. Confluent T2 hyperintensity within the white matter of the cerebral hemispheres most consistent with posttreatment changes appear stable. Mild T2 hyperintensity within the right side of the pons is also unchanged. No evidence of an acute infarct, hemorrhage or extra-axial fluid collection. Vascular: Normal flow voids. Skull and upper cervical spine: Normal marrow signal. Sinuses/Orbits: Trace mucosal thickening throughout the paranasal sinuses. The orbits are maintained. Other: Bilateral mastoid effusion. IMPRESSION: 1. Progression of metastatic disease with 6 new enhancing lesions, as above. 2. Stable posttreatment changes within the white matter of the cerebral hemispheres and right side of the pons. 3. Bilateral mastoid effusion. Electronically Signed   By: Pedro Earls M.D.   On: 07/10/2020 11:32     Assessment/Plan Brain metastases ([place]) [C79.31]  Tarus Briski presents today with clinical syndrome consistent with  suspected epileptic event.  This is supported by subjective history, but also history of cortically based brain metastases and recent exposure to radiosurgery.    He is currently near completion of a decadron taper through radiation oncology, now dosing 2mg  daily.    We recommended initiation of Keppra 500mg  BID for seizure prevention, given suspicion of seizure event.  We counseled him and his wife regarding cognitive impairment from brain radiation and cancer more generally.  Do not recommend further Namenda at this time.  We spent twenty additional minutes teaching regarding the natural history, biology, and historical experience in the treatment of neurologic complications of cancer.   We appreciate the opportunity to participate in the care of [facility].   We ask that Jerone Cudmore return to clinic in 2 months following next brain MRI, or sooner as needed.  All questions were answered. The patient knows to call the clinic with any problems, questions or concerns. No barriers to learning were detected.  The total time spent in the encounter was 45 minutes and more than 50% was on counseling and review of test results   Ventura Sellers, MD Medical Director of Neuro-Oncology [facility] at [place] 08/07/20 2:25 PM

## 2020-08-07 NOTE — Telephone Encounter (Signed)
Walter Hernandez called to say thanks for setting Walter Hernandez up to see Dr. Mickeal Skinner to evaluate his recent cognitive changes. They were thankful for the visit, but want to continue his future follow-up care with Dr. Tammi Klippel and Ailene Ards. Walter Hernandez shared that Walter Hernandez has improved since starting steroids, and they would like to hold off on starting any new medications unless absolutely necessary. If he has another episode of loss of consciousness/awareness, she will let us know and consider starting Keppra at that time.   Mont Dutton R.T.(R)(T) Radiation Special Procedures Navigator

## 2020-08-10 ENCOUNTER — Inpatient Hospital Stay: Payer: Medicare Other

## 2020-08-10 ENCOUNTER — Other Ambulatory Visit: Payer: Self-pay | Admitting: Family

## 2020-08-10 ENCOUNTER — Other Ambulatory Visit: Payer: Self-pay

## 2020-08-10 ENCOUNTER — Inpatient Hospital Stay (HOSPITAL_BASED_OUTPATIENT_CLINIC_OR_DEPARTMENT_OTHER): Payer: Medicare Other | Admitting: Hematology & Oncology

## 2020-08-10 ENCOUNTER — Other Ambulatory Visit: Payer: Self-pay | Admitting: *Deleted

## 2020-08-10 VITALS — Wt 150.0 lb

## 2020-08-10 DIAGNOSIS — R410 Disorientation, unspecified: Secondary | ICD-10-CM | POA: Diagnosis not present

## 2020-08-10 DIAGNOSIS — C7931 Secondary malignant neoplasm of brain: Secondary | ICD-10-CM

## 2020-08-10 DIAGNOSIS — C3490 Malignant neoplasm of unspecified part of unspecified bronchus or lung: Secondary | ICD-10-CM

## 2020-08-10 DIAGNOSIS — C3431 Malignant neoplasm of lower lobe, right bronchus or lung: Secondary | ICD-10-CM | POA: Diagnosis not present

## 2020-08-10 DIAGNOSIS — D649 Anemia, unspecified: Secondary | ICD-10-CM

## 2020-08-10 DIAGNOSIS — K219 Gastro-esophageal reflux disease without esophagitis: Secondary | ICD-10-CM | POA: Diagnosis not present

## 2020-08-10 DIAGNOSIS — Z5111 Encounter for antineoplastic chemotherapy: Secondary | ICD-10-CM | POA: Diagnosis not present

## 2020-08-10 DIAGNOSIS — C4372 Malignant melanoma of left lower limb, including hip: Secondary | ICD-10-CM

## 2020-08-10 LAB — CBC WITH DIFFERENTIAL (CANCER CENTER ONLY)
Abs Immature Granulocytes: 0.21 10*3/uL — ABNORMAL HIGH (ref 0.00–0.07)
Basophils Absolute: 0 10*3/uL (ref 0.0–0.1)
Basophils Relative: 0 %
Eosinophils Absolute: 0 10*3/uL (ref 0.0–0.5)
Eosinophils Relative: 1 %
HCT: 22.7 % — ABNORMAL LOW (ref 39.0–52.0)
Hemoglobin: 7.8 g/dL — ABNORMAL LOW (ref 13.0–17.0)
Immature Granulocytes: 4 %
Lymphocytes Relative: 42 %
Lymphs Abs: 2.2 10*3/uL (ref 0.7–4.0)
MCH: 34.2 pg — ABNORMAL HIGH (ref 26.0–34.0)
MCHC: 34.4 g/dL (ref 30.0–36.0)
MCV: 99.6 fL (ref 80.0–100.0)
Monocytes Absolute: 0.7 10*3/uL (ref 0.1–1.0)
Monocytes Relative: 13 %
Neutro Abs: 2.1 10*3/uL (ref 1.7–7.7)
Neutrophils Relative %: 40 %
Platelet Count: 58 10*3/uL — ABNORMAL LOW (ref 150–400)
RBC: 2.28 MIL/uL — ABNORMAL LOW (ref 4.22–5.81)
WBC Count: 5.3 10*3/uL (ref 4.0–10.5)
nRBC: 0 % (ref 0.0–0.2)

## 2020-08-10 LAB — CMP (CANCER CENTER ONLY)
ALT: 16 U/L (ref 0–44)
AST: 12 U/L — ABNORMAL LOW (ref 15–41)
Albumin: 3.9 g/dL (ref 3.5–5.0)
Alkaline Phosphatase: 70 U/L (ref 38–126)
Anion gap: 7 (ref 5–15)
BUN: 22 mg/dL (ref 8–23)
CO2: 28 mmol/L (ref 22–32)
Calcium: 9.3 mg/dL (ref 8.9–10.3)
Chloride: 99 mmol/L (ref 98–111)
Creatinine: 1.32 mg/dL — ABNORMAL HIGH (ref 0.61–1.24)
GFR, Estimated: 58 mL/min — ABNORMAL LOW (ref >60)
Glucose, Bld: 80 mg/dL (ref 70–99)
Potassium: 3.9 mmol/L (ref 3.5–5.1)
Sodium: 134 mmol/L — ABNORMAL LOW (ref 135–145)
Total Bilirubin: 0.5 mg/dL (ref 0.3–1.2)
Total Protein: 6.3 g/dL — ABNORMAL LOW (ref 6.5–8.1)

## 2020-08-10 LAB — LACTATE DEHYDROGENASE: LDH: 142 U/L (ref 98–192)

## 2020-08-10 LAB — PREPARE RBC (CROSSMATCH)

## 2020-08-10 LAB — SAMPLE TO BLOOD BANK

## 2020-08-10 MED ORDER — SODIUM CHLORIDE 0.9% FLUSH
10.0000 mL | INTRAVENOUS | Status: DC | PRN
  Administered 2020-08-10: 10 mL via INTRAVENOUS
  Filled 2020-08-10: qty 10

## 2020-08-10 MED ORDER — SODIUM CHLORIDE 0.9 % IV SOLN
Freq: Once | INTRAVENOUS | Status: DC
  Filled 2020-08-10: qty 250

## 2020-08-10 MED ORDER — SODIUM CHLORIDE 0.9 % IV SOLN
Freq: Once | INTRAVENOUS | Status: AC
  Filled 2020-08-10: qty 250

## 2020-08-10 MED ORDER — HEPARIN SOD (PORK) LOCK FLUSH 100 UNIT/ML IV SOLN
500.0000 [IU] | Freq: Once | INTRAVENOUS | Status: AC
  Administered 2020-08-10: 500 [IU] via INTRAVENOUS
  Filled 2020-08-10: qty 5

## 2020-08-10 NOTE — Patient Instructions (Signed)
Implanted Port Insertion, Care After This sheet gives you information about how to care for yourself after your procedure. Your health care provider may also give you more specific instructions. If you have problems or questions, contact your health care provider. What can I expect after the procedure? After the procedure, it is common to have:  Discomfort at the port insertion site.  Bruising on the skin over the port. This should improve over 3-4 days. Follow these instructions at home: Port care  After your port is placed, you will get a manufacturer's information card. The card has information about your port. Keep this card with you at all times.  Take care of the port as told by your health care provider. Ask your health care provider if you or a family member can get training for taking care of the port at home. A home health care nurse may also take care of the port.  Make sure to remember what type of port you have. Incision care  Follow instructions from your health care provider about how to take care of your port insertion site. Make sure you: ? Wash your hands with soap and water before and after you change your bandage (dressing). If soap and water are not available, use hand sanitizer. ? Change your dressing as told by your health care provider. ? Leave stitches (sutures), skin glue, or adhesive strips in place. These skin closures may need to stay in place for 2 weeks or longer. If adhesive strip edges start to loosen and curl up, you may trim the loose edges. Do not remove adhesive strips completely unless your health care provider tells you to do that.  Check your port insertion site every day for signs of infection. Check for: ? Redness, swelling, or pain. ? Fluid or blood. ? Warmth. ? Pus or a bad smell.      Activity  Return to your normal activities as told by your health care provider. Ask your health care provider what activities are safe for you.  Do not  lift anything that is heavier than 10 lb (4.5 kg), or the limit that you are told, until your health care provider says that it is safe. General instructions  Take over-the-counter and prescription medicines only as told by your health care provider.  Do not take baths, swim, or use a hot tub until your health care provider approves. Ask your health care provider if you may take showers. You may only be allowed to take sponge baths.  Do not drive for 24 hours if you were given a sedative during your procedure.  Wear a medical alert bracelet in case of an emergency. This will tell any health care providers that you have a port.  Keep all follow-up visits as told by your health care provider. This is important. Contact a health care provider if:  You cannot flush your port with saline as directed, or you cannot draw blood from the port.  You have a fever or chills.  You have redness, swelling, or pain around your port insertion site.  You have fluid or blood coming from your port insertion site.  Your port insertion site feels warm to the touch.  You have pus or a bad smell coming from the port insertion site. Get help right away if:  You have chest pain or shortness of breath.  You have bleeding from your port that you cannot control. Summary  Take care of the port as told by your   health care provider. Keep the manufacturer's information card with you at all times.  Change your dressing as told by your health care provider.  Contact a health care provider if you have a fever or chills or if you have redness, swelling, or pain around your port insertion site.  Keep all follow-up visits as told by your health care provider. This information is not intended to replace advice given to you by your health care provider. Make sure you discuss any questions you have with your health care provider. Document Revised: 12/19/2017 Document Reviewed: 12/19/2017 Elsevier Patient Education   2021 [employer]  

## 2020-08-10 NOTE — Progress Notes (Signed)
Hematology and Oncology Follow Up Visit  Walter Hernandez [id number] 03/03/1951 70 y.o. 08/10/2020   Principle Diagnosis:  Stage IB ([id number]) superficial spreading melanoma of the left lower leg  Small Cell Lung Cancer -- Extensive Stage -- lung/liver/brain mets  Past Therapy: Cranial XRT -- 06/19/2019 thru 07/02/2019  Current Therapy:        Carboplatin/VP-16/Tecentriq -- started on 07/07/2018, s/p cycle #4 Tecentriq - maintenance -- cycle#1 -- start on 10/01/2019 - d/c on 10/21/2019 Zepzelca/Keytruda -- q 3 wk --s/p cycle #6 - started on 10/28/2019 -- d/c on 03/15/2020 CDDP/Irinotecan -- s/p cycle #4 -- started on 03/23/2020   Interim History:  Walter Hernandez is here today for follow-up.  He actually has done incredibly well with chemotherapy.  His PET scan does not show evidence of systemic disease.  I must say, I am quite surprised by this.  He did have radiosurgery for the cerebral metastasis.  He has had after he had this he slept for about 3 days.  His weight is down a pound since we last saw him.  This is the big problem that we have.  I hate the fact that he is not gaining weight.  I told him that if he continues to lose weight, that he is going to start have more stress on his heart and his heart would not be able to take this.  Hopefully, he will be able to increase his appetite.  I am sure we have him on stuff to help increase his appetite.  Has had no nausea or vomiting.  There is been no diarrhea.  He has had no problems with fever.  He has had no leg swelling.  There has been no rashes.  Overall, I would say his performance status is ECOG 1.    Medications:  Allergies as of 08/10/2020   No Known Allergies     Medication List       Accurate as of August 10, 2020  8:45 AM. If you have any questions, ask your nurse or doctor.        STOP taking these medications   cholestyramine 4 g packet Commonly known as: Questran Stopped by: Volanda Napoleon, MD   nicotine 21  mg/24hr patch Commonly known as: NICODERM CQ - dosed in mg/24 hours Stopped by: Volanda Napoleon, MD     TAKE these medications   acetaminophen 500 MG tablet Commonly known as: TYLENOL Take 1,000 mg by mouth every 6 (six) hours as needed for moderate pain.   ALPRAZolam 0.5 MG tablet Commonly known as: XANAX Take 1 tablet (0.5 mg total) by mouth every 6 (six) hours as needed for anxiety.   dexamethasone 4 MG tablet Commonly known as: DECADRON Dexamethasone 4 mg TID x 2 days, BID x 2 days, then 2 mg (1/2 tab) BID x 7 days, then 2 mg (1/2 tab) QD x 7 days then stop.   diphenoxylate-atropine 2.5-0.025 MG tablet Commonly known as: LOMOTIL Take 2 tablets at onset of diarrhea. Take one tablet after each loose stool. Max 8 tablets per day.   dronabinol 5 MG capsule Commonly known as: MARINOL Take 1 capsule (5 mg total) by mouth 2 (two) times daily before a meal.   finasteride 5 MG tablet Commonly known as: PROSCAR TAKE 1 TABLET BY MOUTH EVERY DAY   furosemide 20 MG tablet Commonly known as: LASIX Take 2 tablets (40 mg total) by mouth daily.   lidocaine-prilocaine cream Commonly known as: EMLA Apply to affected area  once   loperamide 2 MG capsule Commonly known as: IMODIUM   LORazepam 0.5 MG tablet Commonly known as: Ativan Take 1 tablet (0.5 mg total) by mouth every 6 (six) hours as needed (Nausea or vomiting).   memantine 10 MG tablet Commonly known as: NAMENDA Take 10 mg by mouth daily.   multivitamin capsule Take 1 capsule by mouth daily.   omeprazole 40 MG capsule Commonly known as: PRILOSEC Take 1 capsule (40 mg total) by mouth 2 (two) times daily.   ondansetron 4 MG tablet Commonly known as: ZOFRAN Take 1 tablet (4 mg total) by mouth every 6 (six) hours as needed for nausea.   ondansetron 8 MG tablet Commonly known as: Zofran Take 1 tablet (8 mg total) by mouth 2 (two) times daily as needed. Start on the third day after cisplatin chemotherapy.    ondansetron 8 MG disintegrating tablet Commonly known as: ZOFRAN-ODT Take 8 mg by mouth.   OPCON-A OP Place 2 drops into both eyes as needed (for dry eyes).   potassium chloride SA 20 MEQ tablet Commonly known as: KLOR-CON Take 1 tablet (20 mEq total) by mouth daily.   prochlorperazine 10 MG tablet Commonly known as: COMPAZINE Take 1 tablet (10 mg total) by mouth every 6 (six) hours as needed (Nausea or vomiting).   tamsulosin 0.4 MG Caps capsule Commonly known as: FLOMAX Take 1 capsule (0.4 mg total) by mouth daily.       Allergies: No Known Allergies  Past Medical History, Surgical history, Social history, and Family History were reviewed and updated.  Review of Systems: Review of Systems  Constitutional: Positive for weight loss.  HENT: Negative.   Eyes: Negative.   Respiratory: Negative.   Cardiovascular: Negative.   Gastrointestinal: Negative.   Genitourinary: Negative.   Musculoskeletal: Negative.   Skin: Negative.   Neurological: Negative.   Endo/Heme/Allergies: Negative.   Psychiatric/Behavioral: Negative.      Physical Exam:  weight is 150 lb 0.6 oz (68.1 kg).   Wt Readings from Last 3 Encounters:  08/10/20 150 lb 0.6 oz (68.1 kg)  08/07/20 151 lb 12.8 oz (68.9 kg)  07/20/20 158 lb (71.7 kg)    Physical Exam Vitals reviewed.  HENT:     Head: Normocephalic and atraumatic.  Eyes:     Pupils: Pupils are equal, round, and reactive to light.  Cardiovascular:     Rate and Rhythm: Normal rate and regular rhythm.     Heart sounds: Normal heart sounds.  Pulmonary:     Effort: Pulmonary effort is normal.     Breath sounds: Normal breath sounds.  Abdominal:     General: Bowel sounds are normal.     Palpations: Abdomen is soft.  Musculoskeletal:        General: No tenderness or deformity. Normal range of motion.     Cervical back: Normal range of motion.  Lymphadenopathy:     Cervical: No cervical adenopathy.  Skin:    General: Skin is warm and  dry.     Findings: No erythema or rash.  Neurological:     Mental Status: He is alert and oriented to person, place, and time.  Psychiatric:        Behavior: Behavior normal.        Thought Content: Thought content normal.        Judgment: Judgment normal.      Lab Results  Component Value Date   WBC 5.3 08/10/2020   HGB 7.8 (L) 08/10/2020  HCT 22.7 (L) 08/10/2020   MCV 99.6 08/10/2020   PLT 58 (L) 08/10/2020   Lab Results  Component Value Date   FERRITIN 2,549 (H) 10/21/2019   IRON 43 10/21/2019   TIBC 225 10/21/2019   UIBC 182 10/21/2019   IRONPCTSAT 19 (L) 10/21/2019   Lab Results  Component Value Date   RETICCTPCT 1.5 08/07/2019   RBC 2.28 (L) 08/10/2020   No results found for: KPAFRELGTCHN, LAMBDASER, KAPLAMBRATIO No results found for: IGGSERUM, IGA, IGMSERUM No results found for: Kathrynn Ducking, MSPIKE, SPEI   Chemistry      Component Value Date/Time   NA 133 (L) 07/20/2020 0813   NA 142 01/30/2017 0804   K 4.1 07/20/2020 0813   K 4.1 01/30/2017 0804   CL 97 (L) 07/20/2020 0813   CL 105 01/25/2016 0926   CO2 27 07/20/2020 0813   CO2 29 01/30/2017 0804   BUN 20 07/20/2020 0813   BUN 6.9 (L) 01/30/2017 0804   CREATININE 1.37 (H) 07/20/2020 0813   CREATININE 0.8 01/30/2017 0804      Component Value Date/Time   CALCIUM 9.6 07/20/2020 0813   CALCIUM 10.5 (H) 01/30/2017 0804   ALKPHOS 67 07/20/2020 0813   ALKPHOS 79 01/30/2017 0804   AST 14 (L) 07/20/2020 0813   AST 34 01/30/2017 0804   ALT 11 07/20/2020 0813   ALT 18 01/30/2017 0804   BILITOT 0.5 07/20/2020 0813   BILITOT 1.49 (H) 01/30/2017 0804       Impression and Plan: Walter Hernandez is a very pleasant 70 yo caucasian gentleman with history of stage 1b melanoma of the left lower leg.   This really is not a problem right now.  He now has extensive stage small cell lung cancer.  Again, his PET scan shows a very nice response.  As such, we have to continue  to try to treat him.  Our options are very, very limited.  Maybe, his blood counts are down because of the radiation that received to the brain metastasis.  We will give him 2 units of blood today.  I think he needs this.  I think this will help him out.  We will see about his treatment be moved to 1 week.  I will plan to see him back myself in April for his 7th cycle of treatment.  I just want his quality life to be good.    Volanda Napoleon, MD 3/7/20228:45 AM

## 2020-08-10 NOTE — Progress Notes (Signed)
9:04 AM Spoke with wife regarding medication reconciliation.

## 2020-08-10 NOTE — Patient Instructions (Signed)
Dehydration, Adult Dehydration is condition in which there is not enough water or other fluids in the body. This happens when a person loses more fluids than he or she takes in. Important body parts cannot work right without the right amount of fluids. Any loss of fluids from the body can cause dehydration. Dehydration can be mild, worse, or very bad. It should be treated right away to keep it from getting very bad. What are the causes? This condition may be caused by:  Conditions that cause loss of water or other fluids, such as: ? Watery poop (diarrhea). ? Vomiting. ? Sweating a lot. ? Peeing (urinating) a lot.  Not drinking enough fluids, especially when you: ? Are ill. ? Are doing things that take a lot of energy to do.  Other illnesses and conditions, such as fever or infection.  Certain medicines, such as medicines that take extra fluid out of the body (diuretics).  Lack of safe drinking water.  Not being able to get enough water and food. What increases the risk? The following factors may make you more likely to develop this condition:  Having a long-term (chronic) illness that has not been treated the right way, such as: ? Diabetes. ? Heart disease. ? Kidney disease.  Being 65 years of age or older.  Having a disability.  Living in a place that is high above the ground or sea (high in altitude). The thinner, dried air causes more fluid loss.  Doing exercises that put stress on your body for a long time. What are the signs or symptoms? Symptoms of dehydration depend on how bad it is. Mild or worse dehydration  Thirst.  Dry lips or dry mouth.  Feeling dizzy or light-headed, especially when you stand up from sitting.  Muscle cramps.  Your body making: ? Dark pee (urine). Pee may be the color of tea. ? Less pee than normal. ? Less tears than normal.  Headache. Very bad dehydration  Changes in skin. Skin may: ? Be cold to the touch (clammy). ? Be blotchy  or pale. ? Not go back to normal right after you lightly pinch it and let it go.  Little or no tears, pee, or sweat.  Changes in vital signs, such as: ? Fast breathing. ? Low blood pressure. ? Weak pulse. ? Pulse that is more than 100 beats a minute when you are sitting still.  Other changes, such as: ? Feeling very thirsty. ? Eyes that look hollow (sunken). ? Cold hands and feet. ? Being mixed up (confused). ? Being very tired (lethargic) or having trouble waking from sleep. ? Short-term weight loss. ? Loss of consciousness. How is this treated? Treatment for this condition depends on how bad it is. Treatment should start right away. Do not wait until your condition gets very bad. Very bad dehydration is an emergency. You will need to go to a hospital.  Mild or worse dehydration can be treated at home. You may be asked to: ? Drink more fluids. ? Drink an oral rehydration solution (ORS). This drink helps get the right amounts of fluids and salts and minerals in the blood (electrolytes).  Very bad dehydration can be treated: ? With fluids through an IV tube. ? By getting normal levels of salts and minerals in your blood. This is often done by giving salts and minerals through a tube. The tube is passed through your nose and into your stomach. ? By treating the root cause. Follow these instructions at   home: Oral rehydration solution If told by your doctor, drink an ORS:  Make an ORS. Use instructions on the package.  Start by drinking small amounts, about  cup (120 mL) every 5-10 minutes.  Slowly drink more until you have had the amount that your doctor said to have. Eating and drinking  Drink enough clear fluid to keep your pee pale yellow. If you were told to drink an ORS, finish the ORS first. Then, start slowly drinking other clear fluids. Drink fluids such as: ? Water. Do not drink only water. Doing that can make the salt (sodium) level in your body get too low. ? Water  from ice chips you suck on. ? Fruit juice that you have added water to (diluted). ? Low-calorie sports drinks.  Eat foods that have the right amounts of salts and minerals, such as: ? Bananas. ? Oranges. ? Potatoes. ? Tomatoes. ? Spinach.  Do not drink alcohol.  Avoid: ? Drinks that have a lot of sugar. These include:  High-calorie sports drinks.  Fruit juice that you did not add water to.  Soda.  Caffeine. ? Foods that are greasy or have a lot of fat or sugar.         General instructions  Take over-the-counter and prescription medicines only as told by your doctor.  Do not take salt tablets. Doing that can make the salt level in your body get too high.  Return to your normal activities as told by your doctor. Ask your doctor what activities are safe for you.  Keep all follow-up visits as told by your doctor. This is important. Contact a doctor if:  You have pain in your belly (abdomen) and the pain: ? Gets worse. ? Stays in one place.  You have a rash.  You have a stiff neck.  You get angry or annoyed (irritable) more easily than normal.  You are more tired or have a harder time waking than normal.  You feel: ? Weak or dizzy. ? Very thirsty. Get help right away if you have:  Any symptoms of very bad dehydration.  Symptoms of vomiting, such as: ? You cannot eat or drink without vomiting. ? Your vomiting gets worse or does not go away. ? Your vomit has blood or green stuff in it.  Symptoms that get worse with treatment.  A fever.  A very bad headache.  Problems with peeing or pooping (having a bowel movement), such as: ? Watery poop that gets worse or does not go away. ? Blood in your poop (stool). This may cause poop to look black and tarry. ? Not peeing in 6-8 hours. ? Peeing only a small amount of very dark pee in 6-8 hours.  Trouble breathing. These symptoms may be an emergency. Do not wait to see if the symptoms will go away. Get  medical help right away. Call your local emergency services (911 in the [place] Do not drive yourself to the hospital. Summary  Dehydration is a condition in which there is not enough water or other fluids in the body. This happens when a person loses more fluids than he or she takes in.  Treatment for this condition depends on how bad it is. Treatment should be started right away. Do not wait until your condition gets very bad.  Drink enough clear fluid to keep your pee pale yellow. If you were told to drink an oral rehydration solution (ORS), finish the ORS first. Then, start slowly drinking other clear fluids.    Take over-the-counter and prescription medicines only as told by your doctor.  Get help right away if you have any symptoms of very bad dehydration. This information is not intended to replace advice given to you by your health care provider. Make sure you discuss any questions you have with your health care provider. Document Revised: 01/03/2019 Document Reviewed: 01/03/2019 Elsevier Patient Education  2021 [employer]  

## 2020-08-11 ENCOUNTER — Inpatient Hospital Stay: Payer: Medicare Other

## 2020-08-11 ENCOUNTER — Encounter: Payer: Self-pay | Admitting: *Deleted

## 2020-08-11 ENCOUNTER — Ambulatory Visit: Payer: Medicare Other | Admitting: Internal Medicine

## 2020-08-11 DIAGNOSIS — C7931 Secondary malignant neoplasm of brain: Secondary | ICD-10-CM | POA: Diagnosis not present

## 2020-08-11 DIAGNOSIS — C3431 Malignant neoplasm of lower lobe, right bronchus or lung: Secondary | ICD-10-CM | POA: Diagnosis not present

## 2020-08-11 DIAGNOSIS — C4372 Malignant melanoma of left lower limb, including hip: Secondary | ICD-10-CM | POA: Diagnosis not present

## 2020-08-11 DIAGNOSIS — C3490 Malignant neoplasm of unspecified part of unspecified bronchus or lung: Secondary | ICD-10-CM

## 2020-08-11 DIAGNOSIS — R410 Disorientation, unspecified: Secondary | ICD-10-CM | POA: Diagnosis not present

## 2020-08-11 DIAGNOSIS — K219 Gastro-esophageal reflux disease without esophagitis: Secondary | ICD-10-CM | POA: Diagnosis not present

## 2020-08-11 DIAGNOSIS — Z5111 Encounter for antineoplastic chemotherapy: Secondary | ICD-10-CM | POA: Diagnosis not present

## 2020-08-11 DIAGNOSIS — D649 Anemia, unspecified: Secondary | ICD-10-CM

## 2020-08-11 MED ORDER — DIPHENHYDRAMINE HCL 25 MG PO CAPS: 25.0000 mg | ORAL_CAPSULE | Freq: Once | ORAL | Status: DC

## 2020-08-11 MED ORDER — SODIUM CHLORIDE 0.9% IV SOLUTION
250.0000 mL | Freq: Once | INTRAVENOUS | Status: AC
  Administered 2020-08-11: 250 mL via INTRAVENOUS
  Filled 2020-08-11: qty 250

## 2020-08-11 MED ORDER — SODIUM CHLORIDE 0.9% FLUSH
10.0000 mL | INTRAVENOUS | Status: AC | PRN
  Administered 2020-08-11: 10 mL
  Filled 2020-08-11: qty 10

## 2020-08-11 MED ORDER — ACETAMINOPHEN 325 MG PO TABS: 650.0000 mg | ORAL_TABLET | Freq: Once | ORAL | Status: DC

## 2020-08-11 MED ORDER — HEPARIN SOD (PORK) LOCK FLUSH 100 UNIT/ML IV SOLN
500.0000 [IU] | Freq: Every day | INTRAVENOUS | Status: AC | PRN
  Administered 2020-08-11: 500 [IU]
  Filled 2020-08-11: qty 5

## 2020-08-11 NOTE — Progress Notes (Signed)
Patient's treatment delayed by one week. Here today getting blood. He is doing well without complaint. He is trying to increase his intake to gain some weight.   Oncology Nurse Navigator Documentation  Oncology Nurse Navigator Flowsheets 08/11/2020  Abnormal Finding Date -  Confirmed Diagnosis Date -  Diagnosis Status -  Planned Course of Treatment -  Phase of Treatment -  Chemotherapy Actual Start Date: -  Radiation Actual Start Date: -  Navigator Follow Up Date: 08/17/2020  Navigator Follow Up Reason: Follow-up Appointment;Chemotherapy  Navigator Location [facility]  Navigator Encounter Type Treatment;Appt/Treatment Plan Review  Telephone -  Treatment Initiated Date -  Patient Visit Type MedOnc  Treatment Phase Active Tx  Barriers/Navigation Needs Coordination of Care;Education  Education -  Interventions Psycho-Social Support  Acuity Level 2-Minimal Needs (1-2 Barriers Identified)  Referrals -  Coordination of Care -  Education Method -  Support Groups/Services Friends and Family  Time Spent with Patient 15

## 2020-08-11 NOTE — Patient Instructions (Signed)

## 2020-08-12 ENCOUNTER — Other Ambulatory Visit: Payer: Self-pay | Admitting: Hematology & Oncology

## 2020-08-12 LAB — TYPE AND SCREEN
ABO/RH(D): O POS
Antibody Screen: NEGATIVE
Unit division: 0
Unit division: 0

## 2020-08-12 LAB — BPAM RBC
Blood Product Expiration Date: 202203282359
Blood Product Expiration Date: 202204022359
ISSUE DATE / TIME: 202203080730
ISSUE DATE / TIME: 202203080730
Unit Type and Rh: 5100
Unit Type and Rh: 5100

## 2020-08-14 ENCOUNTER — Other Ambulatory Visit: Payer: Self-pay | Admitting: *Deleted

## 2020-08-14 DIAGNOSIS — C3490 Malignant neoplasm of unspecified part of unspecified bronchus or lung: Secondary | ICD-10-CM

## 2020-08-14 DIAGNOSIS — C4372 Malignant melanoma of left lower limb, including hip: Secondary | ICD-10-CM

## 2020-08-14 DIAGNOSIS — C7931 Secondary malignant neoplasm of brain: Secondary | ICD-10-CM

## 2020-08-14 DIAGNOSIS — D649 Anemia, unspecified: Secondary | ICD-10-CM

## 2020-08-17 ENCOUNTER — Inpatient Hospital Stay: Payer: Medicare Other

## 2020-08-17 ENCOUNTER — Other Ambulatory Visit: Payer: Self-pay

## 2020-08-17 ENCOUNTER — Encounter: Payer: Self-pay | Admitting: *Deleted

## 2020-08-17 VITALS — HR 96

## 2020-08-17 DIAGNOSIS — C4372 Malignant melanoma of left lower limb, including hip: Secondary | ICD-10-CM

## 2020-08-17 DIAGNOSIS — C3431 Malignant neoplasm of lower lobe, right bronchus or lung: Secondary | ICD-10-CM | POA: Diagnosis not present

## 2020-08-17 DIAGNOSIS — C7931 Secondary malignant neoplasm of brain: Secondary | ICD-10-CM

## 2020-08-17 DIAGNOSIS — D649 Anemia, unspecified: Secondary | ICD-10-CM

## 2020-08-17 DIAGNOSIS — K219 Gastro-esophageal reflux disease without esophagitis: Secondary | ICD-10-CM | POA: Diagnosis not present

## 2020-08-17 DIAGNOSIS — C3490 Malignant neoplasm of unspecified part of unspecified bronchus or lung: Secondary | ICD-10-CM

## 2020-08-17 DIAGNOSIS — R410 Disorientation, unspecified: Secondary | ICD-10-CM | POA: Diagnosis not present

## 2020-08-17 DIAGNOSIS — Z5111 Encounter for antineoplastic chemotherapy: Secondary | ICD-10-CM | POA: Diagnosis not present

## 2020-08-17 LAB — CMP (CANCER CENTER ONLY)
ALT: 11 U/L (ref 0–44)
AST: 11 U/L — ABNORMAL LOW (ref 15–41)
Albumin: 3.9 g/dL (ref 3.5–5.0)
Alkaline Phosphatase: 67 U/L (ref 38–126)
Anion gap: 8 (ref 5–15)
BUN: 23 mg/dL (ref 8–23)
CO2: 26 mmol/L (ref 22–32)
Calcium: 9.5 mg/dL (ref 8.9–10.3)
Chloride: 99 mmol/L (ref 98–111)
Creatinine: 1.34 mg/dL — ABNORMAL HIGH (ref 0.61–1.24)
GFR, Estimated: 57 mL/min — ABNORMAL LOW (ref >60)
Glucose, Bld: 126 mg/dL — ABNORMAL HIGH (ref 70–99)
Potassium: 3.8 mmol/L (ref 3.5–5.1)
Sodium: 133 mmol/L — ABNORMAL LOW (ref 135–145)
Total Bilirubin: 0.6 mg/dL (ref 0.3–1.2)
Total Protein: 6.4 g/dL — ABNORMAL LOW (ref 6.5–8.1)

## 2020-08-17 LAB — CBC WITH DIFFERENTIAL (CANCER CENTER ONLY)
Abs Immature Granulocytes: 0.11 K/uL — ABNORMAL HIGH (ref 0.00–0.07)
Basophils Absolute: 0 K/uL (ref 0.0–0.1)
Basophils Relative: 0 %
Eosinophils Absolute: 0 K/uL (ref 0.0–0.5)
Eosinophils Relative: 1 %
HCT: 30.8 % — ABNORMAL LOW (ref 39.0–52.0)
Hemoglobin: 10.5 g/dL — ABNORMAL LOW (ref 13.0–17.0)
Immature Granulocytes: 2 %
Lymphocytes Relative: 49 %
Lymphs Abs: 2.5 K/uL (ref 0.7–4.0)
MCH: 32.6 pg (ref 26.0–34.0)
MCHC: 34.1 g/dL (ref 30.0–36.0)
MCV: 95.7 fL (ref 80.0–100.0)
Monocytes Absolute: 0.6 K/uL (ref 0.1–1.0)
Monocytes Relative: 12 %
Neutro Abs: 1.8 K/uL (ref 1.7–7.7)
Neutrophils Relative %: 36 %
Platelet Count: 54 K/uL — ABNORMAL LOW (ref 150–400)
RBC: 3.22 MIL/uL — ABNORMAL LOW (ref 4.22–5.81)
RDW: 19.2 % — ABNORMAL HIGH (ref 11.5–15.5)
WBC Count: 5.1 K/uL (ref 4.0–10.5)
nRBC: 0 % (ref 0.0–0.2)

## 2020-08-17 MED ORDER — SODIUM CHLORIDE 0.9 % IV SOLN
150.0000 mg | Freq: Once | INTRAVENOUS | Status: AC
  Administered 2020-08-17: 150 mg via INTRAVENOUS
  Filled 2020-08-17: qty 150

## 2020-08-17 MED ORDER — PALONOSETRON HCL INJECTION 0.25 MG/5ML
INTRAVENOUS | Status: AC
  Filled 2020-08-17: qty 5

## 2020-08-17 MED ORDER — SODIUM CHLORIDE 0.9 % IV SOLN
60.0000 mg/m2 | Freq: Once | INTRAVENOUS | Status: AC
  Administered 2020-08-17: 120 mg via INTRAVENOUS
  Filled 2020-08-17: qty 5

## 2020-08-17 MED ORDER — MAGNESIUM SULFATE 2 GM/50ML IV SOLN
2.0000 g | Freq: Once | INTRAVENOUS | Status: AC
  Administered 2020-08-17: 2 g via INTRAVENOUS
  Filled 2020-08-17: qty 50

## 2020-08-17 MED ORDER — HEPARIN SOD (PORK) LOCK FLUSH 100 UNIT/ML IV SOLN
500.0000 [IU] | Freq: Once | INTRAVENOUS | Status: AC | PRN
  Administered 2020-08-17: 500 [IU]
  Filled 2020-08-17: qty 5

## 2020-08-17 MED ORDER — SODIUM CHLORIDE 0.9 % IV SOLN
30.0000 mg/m2 | Freq: Once | INTRAVENOUS | Status: AC
  Administered 2020-08-17: 56 mg via INTRAVENOUS
  Filled 2020-08-17: qty 56

## 2020-08-17 MED ORDER — SODIUM CHLORIDE 0.9% FLUSH
10.0000 mL | INTRAVENOUS | Status: DC | PRN
  Administered 2020-08-17: 10 mL
  Filled 2020-08-17: qty 10

## 2020-08-17 MED ORDER — SODIUM CHLORIDE 0.9 % IV SOLN
Freq: Once | INTRAVENOUS | Status: AC
  Filled 2020-08-17: qty 250

## 2020-08-17 MED ORDER — SODIUM CHLORIDE 0.9 % IV SOLN
10.0000 mg | Freq: Once | INTRAVENOUS | Status: AC
  Administered 2020-08-17: 10 mg via INTRAVENOUS
  Filled 2020-08-17: qty 10

## 2020-08-17 MED ORDER — POTASSIUM CHLORIDE IN NACL 20-0.9 MEQ/L-% IV SOLN
Freq: Once | INTRAVENOUS | Status: AC
  Filled 2020-08-17: qty 1000

## 2020-08-17 MED ORDER — PALONOSETRON HCL INJECTION 0.25 MG/5ML
0.2500 mg | Freq: Once | INTRAVENOUS | Status: AC
  Administered 2020-08-17: 0.25 mg via INTRAVENOUS

## 2020-08-17 NOTE — Patient Instructions (Signed)
Irinotecan injection What is this medicine? IRINOTECAN (ir in oh TEE kan ) is a chemotherapy drug. It is used to treat colon and rectal cancer. This medicine may be used for other purposes; ask your health care provider or pharmacist if you have questions. COMMON BRAND NAME(S): Camptosar What should I tell my health care provider before I take this medicine? They need to know if you have any of these conditions:  dehydration  diarrhea  infection (especially a virus infection such as chickenpox, cold sores, or herpes)  liver disease  low blood counts, like low white cell, platelet, or red cell counts  low levels of calcium, magnesium, or potassium in the blood  recent or ongoing radiation therapy  an unusual or allergic reaction to irinotecan, other medicines, foods, dyes, or preservatives  pregnant or trying to get pregnant  breast-feeding How should I use this medicine? This drug is given as an infusion into a vein. It is administered in a hospital or clinic by a specially trained health care professional. Talk to your pediatrician regarding the use of this medicine in children. Special care may be needed. Overdosage: If you think you have taken too much of this medicine contact a poison control center or emergency room at once. NOTE: This medicine is only for you. Do not share this medicine with others. What if I miss a dose? It is important not to miss your dose. Call your doctor or health care professional if you are unable to keep an appointment. What may interact with this medicine? Do not take this medicine with any of the following medications:  cobicistat  itraconazole This medicine may interact with the following medications:  antiviral medicines for HIV or AIDS  certain antibiotics like rifampin or rifabutin  certain medicines for fungal infections like ketoconazole, posaconazole, and voriconazole  certain medicines for seizures like carbamazepine,  phenobarbital, phenotoin  clarithromycin  gemfibrozil  nefazodone  St. John's Wort This list may not describe all possible interactions. Give your health care provider a list of all the medicines, herbs, non-prescription drugs, or dietary supplements you use. Also tell them if you smoke, drink alcohol, or use illegal drugs. Some items may interact with your medicine. What should I watch for while using this medicine? Your condition will be monitored carefully while you are receiving this medicine. You will need important blood work done while you are taking this medicine. This drug may make you feel generally unwell. This is not uncommon, as chemotherapy can affect healthy cells as well as cancer cells. Report any side effects. Continue your course of treatment even though you feel ill unless your doctor tells you to stop. In some cases, you may be given additional medicines to help with side effects. Follow all directions for their use. You may get drowsy or dizzy. Do not drive, use machinery, or do anything that needs mental alertness until you know how this medicine affects you. Do not stand or sit up quickly, especially if you are an older patient. This reduces the risk of dizzy or fainting spells. Call your health care professional for advice if you get a fever, chills, or sore throat, or other symptoms of a cold or flu. Do not treat yourself. This medicine decreases your body's ability to fight infections. Try to avoid being around people who are sick. Avoid taking products that contain aspirin, acetaminophen, ibuprofen, naproxen, or ketoprofen unless instructed by your doctor. These medicines may hide a fever. This medicine may increase your risk  to bruise or bleed. Call your doctor or health care professional if you notice any unusual bleeding. Be careful brushing and flossing your teeth or using a toothpick because you may get an infection or bleed more easily. If you have any dental work  done, tell your dentist you are receiving this medicine. Do not become pregnant while taking this medicine or for 6 months after stopping it. Women should inform their health care professional if they wish to become pregnant or think they might be pregnant. Men should not father a child while taking this medicine and for 3 months after stopping it. There is potential for serious side effects to an unborn child. Talk to your health care professional for more information. Do not breast-feed an infant while taking this medicine or for 7 days after stopping it. This medicine has caused ovarian failure in some women. This medicine may make it more difficult to get pregnant. Talk to your health care professional if you are concerned about your fertility. This medicine has caused decreased sperm counts in some men. This may make it more difficult to father a child. Talk to your health care professional if you are concerned about your fertility. What side effects may I notice from receiving this medicine? Side effects that you should report to your doctor or health care professional as soon as possible:  allergic reactions like skin rash, itching or hives, swelling of the face, lips, or tongue  chest pain  diarrhea  flushing, runny nose, sweating during infusion  low blood counts - this medicine may decrease the number of white blood cells, red blood cells and platelets. You may be at increased risk for infections and bleeding.  nausea, vomiting  pain, swelling, warmth in the leg  signs of decreased platelets or bleeding - bruising, pinpoint red spots on the skin, black, tarry stools, blood in the urine  signs of infection - fever or chills, cough, sore throat, pain or difficulty passing urine  signs of decreased red blood cells - unusually weak or tired, fainting spells, lightheadedness Side effects that usually do not require medical attention (report to your doctor or health care professional  if they continue or are bothersome):  constipation  hair loss  headache  loss of appetite  mouth sores  stomach pain This list may not describe all possible side effects. Call your doctor for medical advice about side effects. You may report side effects to FDA at 1-800-FDA-1088. Where should I keep my medicine? This drug is given in a hospital or clinic and will not be stored at home. NOTE: This sheet is a summary. It may not cover all possible information. If you have questions about this medicine, talk to your doctor, pharmacist, or health care provider.  2021 [employer]/Gold Standard (2019-04-23 17:46:13) Cisplatin injection What is this medicine? CISPLATIN (SIS pla tin) is a chemotherapy drug. It targets fast dividing cells, like cancer cells, and causes these cells to die. This medicine is used to treat many types of cancer like bladder, ovarian, and testicular cancers. This medicine may be used for other purposes; ask your health care provider or pharmacist if you have questions. COMMON BRAND NAME(S): Platinol, Platinol -AQ What should I tell my health care provider before I take this medicine? They need to know if you have any of these conditions:  eye disease, vision problems  hearing problems  kidney disease  low blood counts, like white cells, platelets, or red blood cells  tingling of the fingers  or toes, or other nerve disorder  an unusual or allergic reaction to cisplatin, carboplatin, oxaliplatin, other medicines, foods, dyes, or preservatives  pregnant or trying to get pregnant  breast-feeding How should I use this medicine? This drug is given as an infusion into a vein. It is administered in a hospital or clinic by a specially trained health care professional. Talk to your pediatrician regarding the use of this medicine in children. Special care may be needed. Overdosage: If you think you have taken too much of this medicine contact a poison control center  or emergency room at once. NOTE: This medicine is only for you. Do not share this medicine with others. What if I miss a dose? It is important not to miss a dose. Call your doctor or health care professional if you are unable to keep an appointment. What may interact with this medicine? This medicine may interact with the following medications:  foscarnet  certain antibiotics like amikacin, gentamicin, neomycin, polymyxin B, streptomycin, tobramycin, vancomycin This list may not describe all possible interactions. Give your health care provider a list of all the medicines, herbs, non-prescription drugs, or dietary supplements you use. Also tell them if you smoke, drink alcohol, or use illegal drugs. Some items may interact with your medicine. What should I watch for while using this medicine? Your condition will be monitored carefully while you are receiving this medicine. You will need important blood work done while you are taking this medicine. This drug may make you feel generally unwell. This is not uncommon, as chemotherapy can affect healthy cells as well as cancer cells. Report any side effects. Continue your course of treatment even though you feel ill unless your doctor tells you to stop. This medicine may increase your risk of getting an infection. Call your healthcare professional for advice if you get a fever, chills, or sore throat, or other symptoms of a cold or flu. Do not treat yourself. Try to avoid being around people who are sick. Avoid taking medicines that contain aspirin, acetaminophen, ibuprofen, naproxen, or ketoprofen unless instructed by your healthcare professional. These medicines may hide a fever. This medicine may increase your risk to bruise or bleed. Call your doctor or health care professional if you notice any unusual bleeding. Be careful brushing and flossing your teeth or using a toothpick because you may get an infection or bleed more easily. If you have any  dental work done, tell your dentist you are receiving this medicine. Do not become pregnant while taking this medicine or for 14 months after stopping it. Women should inform their healthcare professional if they wish to become pregnant or think they might be pregnant. Men should not father a child while taking this medicine and for 11 months after stopping it. There is potential for serious side effects to an unborn child. Talk to your healthcare professional for more information. Do not breast-feed an infant while taking this medicine. This medicine has caused ovarian failure in some women. This medicine may make it more difficult to get pregnant. Talk to your healthcare professional if you are concerned about your fertility. This medicine has caused decreased sperm counts in some men. This may make it more difficult to father a child. Talk to your healthcare professional if you are concerned about your fertility. Drink fluids as directed while you are taking this medicine. This will help protect your kidneys. Call your doctor or health care professional if you get diarrhea. Do not treat yourself. What side  effects may I notice from receiving this medicine? Side effects that you should report to your doctor or health care professional as soon as possible:  allergic reactions like skin rash, itching or hives, swelling of the face, lips, or tongue  blurred vision  changes in vision  decreased hearing or ringing of the ears  nausea, vomiting  pain, redness, or irritation at site where injected  pain, tingling, numbness in the hands or feet  signs and symptoms of bleeding such as bloody or black, tarry stools; red or dark brown urine; spitting up blood or brown material that looks like coffee grounds; red spots on the skin; unusual bruising or bleeding from the eyes, gums, or nose  signs and symptoms of infection like fever; chills; cough; sore throat; pain or trouble passing urine  signs  and symptoms of kidney injury like trouble passing urine or change in the amount of urine  signs and symptoms of low red blood cells or anemia such as unusually weak or tired; feeling faint or lightheaded; falls; breathing problems Side effects that usually do not require medical attention (report to your doctor or health care professional if they continue or are bothersome):  loss of appetite  mouth sores  muscle cramps This list may not describe all possible side effects. Call your doctor for medical advice about side effects. You may report side effects to FDA at 1-800-FDA-1088. Where should I keep my medicine? This drug is given in a hospital or clinic and will not be stored at home. NOTE: This sheet is a summary. It may not cover all possible information. If you have questions about this medicine, talk to your doctor, pharmacist, or health care provider.  2021 [employer]/Gold Standard (2018-05-18 15:59:17)

## 2020-08-17 NOTE — Progress Notes (Signed)
Oncology Nurse Navigator Documentation  Oncology Nurse Navigator Flowsheets 08/17/2020  Abnormal Finding Date -  Confirmed Diagnosis Date -  Diagnosis Status -  Planned Course of Treatment -  Phase of Treatment -  Chemotherapy Actual Start Date: -  Radiation Actual Start Date: -  Navigator Follow Up Date: 09/07/2020  Navigator Follow Up Reason: Follow-up Appointment;Chemotherapy  Production assistant, radio Encounter Type Treatment  Telephone -  Treatment Initiated Date -  Patient Visit Type MedOnc  Treatment Phase Active Tx  Barriers/Navigation Needs Coordination of Care;Education  Education -  Interventions Psycho-Social Support  Acuity Level 2-Minimal Needs (1-2 Barriers Identified)  Referrals -  Coordination of Care -  Education Method -  Support Groups/Services Friends and Family  Time Spent with Patient 15

## 2020-08-17 NOTE — Patient Instructions (Signed)

## 2020-08-17 NOTE — Progress Notes (Signed)
Reviewed pt labs with Dr. Marin Olp and pt ok to treat with platelets 54.

## 2020-08-19 ENCOUNTER — Encounter: Payer: Self-pay | Admitting: Urology

## 2020-08-20 ENCOUNTER — Ambulatory Visit
Admission: RE | Admit: 2020-08-20 | Discharge: 2020-08-20 | Disposition: A | Discharge status: Home / Self Care | Payer: Medicare Other | Source: Ambulatory Visit | Attending: Urology | Admitting: Urology

## 2020-08-20 ENCOUNTER — Other Ambulatory Visit: Payer: Self-pay

## 2020-08-20 DIAGNOSIS — C7931 Secondary malignant neoplasm of brain: Secondary | ICD-10-CM

## 2020-08-20 NOTE — Progress Notes (Signed)
Radiation Oncology         [phone] ________________________________  Name: Walter Hernandez MRN: [id number]  Date: 08/20/2020  DOB: September 14, 1950  Post Treatment Note  CC: Lavone Orn, MD  Lavone Orn, MD  Diagnosis:   70 yo man with brain metastases from extensive stage small cell lung cancer.  Interval Since Last Radiation:  1 year  07/22/20:  These six distinct brain metastases were treated using single isocenter to a prescription dose of 18-20 Gy in a single fraction as below:   1/13-1/26/21:   The whole brain was treated to 30 Gy in 10 fractions of 3 Gy  Narrative:  I spoke with the patient and his wife to conduct his routine scheduled 1 month follow up visit  via telephone to spare the patient unnecessary potential exposure in the healthcare setting during the current COVID-19 pandemic.  The patient was notified in advance and gave permission to proceed with this visit format.  The patient has recovered well from his recent [facility] treatment and had previous WBRT that was tolerated well also.  At the time of our last visit in 07/2020, he reported occasional mild headaches that have been occurring more frequently over the past month but respond to OTC medications.  He denied, N/V, dizziness, imbalance, focal weakness, changes in auditory or visual acuity, tremors or seizure activity. He completed 4 cycles of systemic chemotherapy with Carboplatin/VP-16/Tecentriq and transitioned to maintenance immunotherapy with single agent Tecentriq on 10/01/19 under the care of Dr. Marin Olp.  Unfortunately, a repeat PET scan on 10/17/19 showed progressive disease with worsening liver metastases and increased LAN in the mediastinum, hilum and upper abdomen. Therefore, the Tecentriq was discontinued and he was switched to Zelpelca/Keytruda with his first cycle on 10/28/19. A restaging PET scan on 12/23/19, after completing 3 cycles of his new systemic therapy showed a near complete metabolic response to treatment  with only mild residual hypermetabolism within a single portacaval nodal metastasis, which had decreased in size and significantly decreased in metabolism. Otherwise resolved right lower neck, mediastinal, right hilar and upper abdominal lymphadenopathy and resolved liver metastases. However, a follow up PET scan for disease restaging, performed on 03/16/20 showed significant disease progression with approximately 12 new metastatic lesions in the liver; new and increased porta hepatis and portacaval adenopathy; and new and increased thoracic adenopathy in addition to a right level V lymph node which is small but hypermetabolic. His systemic therapy was again changed, discontinuing the Zelpelca/Keytruda and starting CDDP/Irinotecan cycle #1 on 03/23/2020. A repeat PET scan on 04/29/20 showed an excellent response to treatment with marked improvement with the hepatic metastatic lesions markedly reduced in size and no longer hypermetabolic and with the thoracic, porta hepatis and right neck level V lymph nodes no longer hypermetabolic and also reduced in size.  His follow-up brain MRI scans continued to show an excellent response to his whole brain radiation treatment with stable to decreased size of a treated right frontal lesion, stable punctate left frontal lesion and no new masses or abnormal enhancement. He continued to tolerate the systemic treatments well aside from some diarrhea which has been manageable.  As of 07/06/2020, he had completed 4 cycles with several treatment delays and dose adjustments along the way.  Unfortunately, his follow up MRI brain scan from 07/10/2020 showed disease progression with 6 new enhancing lesions with a 12 mm right frontal lobe lesion, 10 mm medial right thalamic lesion, 6 mm right mid brain lesion, 12 mm left temporal lesion, 1 mm  left occipital lesion and a 2 mm left cerebellar lesion.  On review with the radiologist at multidisciplinary conference, the cerebellar lesion was  felt most likely to be vascular and not a metastasis. I reviewed the results with him and his wife via telephone on 07/14/2020 as well as the consensus recommendation from the recent multidisciplinary brain conference on 07/13/2020, to proceed with salvage SRS treatment in a single fraction. He was in agreement and this treatment was completed on 07/22/20. He tolerated the treatment well but did have some cognitive changes a few days following treatment where he was extremely tired and not wanting to get out of bed and was not eating due to anorexia. He denied headache, nausea, dizziness, change in vision, or any other new CNS symptoms. He was started on a short course of low dose steroids to treat potential treatment related edema/swelling and he completed the taper as prescribed with resolution of symptoms and today, she reports that he is back to his baseline.  He did meet with Dr. Mickeal Skinner on 08/07/20 and was recommended to start Magnet for possible focal seizure but he did not take the medication.  On review of systems, the patient states that he is doing well in general.  He has had some mild residual fatigue but reports that this is much improved and he is back to caring form most of his ADLs independently. He was able to resume chemotherapy recently since his blood counts have now improved.  His most recent cycle of CDDP/Irinotecan  Was received on 08/17/20 and he has tolerated this well. He is no longer having any N/V.  He continues with poor appetite but has continued to eat meals regularly in an attempt to maintain his weight.. He has had significant improvement in his swallowing with solids since having an esophageal dilatation. He is drinking 2-3 protein shakes per day to supplement his nutrition in addition to more regular meals. He still has some decreased taste/change in his taste buds but is adapting. Otherwise, he denies severe headaches, dizziness, paraesthesias, focal weakness, tremors or seizure  activity.  The left sided weakness remains completely resolved which he is delighted with. He feels like he is gradually regaining his strength. Overall, he is pleased with his progress to date.  ALLERGIES:  has No Known Allergies.  Meds: Current Outpatient Medications  Medication Sig Dispense Refill  . acetaminophen (TYLENOL) 500 MG tablet Take 1,000 mg by mouth every 6 (six) hours as needed for moderate pain.    Marland Kitchen dexamethasone (DECADRON) 4 MG tablet Dexamethasone 4 mg TID x 2 days, BID x 2 days, then 2 mg (1/2 tab) BID x 7 days, then 2 mg (1/2 tab) QD x 7 days then stop. 30 tablet 0  . diphenoxylate-atropine (LOMOTIL) 2.5-0.025 MG tablet Take 2 tablets at onset of diarrhea. Take one tablet after each loose stool. Max 8 tablets per day. 60 tablet 2  . finasteride (PROSCAR) 5 MG tablet TAKE 1 TABLET BY MOUTH EVERY DAY 90 tablet 1  . furosemide (LASIX) 20 MG tablet Take 2 tablets (40 mg total) by mouth daily. 30 tablet 5  . lidocaine-prilocaine (EMLA) cream Apply to affected area once 30 g 3  . LORazepam (ATIVAN) 0.5 MG tablet Take 1 tablet (0.5 mg total) by mouth every 6 (six) hours as needed (Nausea or vomiting). 30 tablet 0  . Multiple Vitamin (MULTIVITAMIN) capsule Take 1 capsule by mouth daily.    . Naphazoline-Pheniramine (OPCON-A OP) Place 2 drops into both eyes  as needed (for dry eyes).    Marland Kitchen omeprazole (PRILOSEC) 40 MG capsule Take 1 capsule (40 mg total) by mouth 2 (two) times daily. 180 capsule 3  . ondansetron (ZOFRAN) 4 MG tablet Take 1 tablet (4 mg total) by mouth every 6 (six) hours as needed for nausea. 20 tablet 0  . ondansetron (ZOFRAN) 8 MG tablet Take 1 tablet (8 mg total) by mouth 2 (two) times daily as needed. Start on the third day after cisplatin chemotherapy. 30 tablet 1  . ondansetron (ZOFRAN-ODT) 8 MG disintegrating tablet Take 8 mg by mouth.    . prochlorperazine (COMPAZINE) 10 MG tablet Take 1 tablet (10 mg total) by mouth every 6 (six) hours as needed (Nausea or  vomiting). 30 tablet 1  . tamsulosin (FLOMAX) 0.4 MG CAPS capsule Take 1 capsule (0.4 mg total) by mouth daily. 60 capsule 1  . ALPRAZolam (XANAX) 0.5 MG tablet Take 1 tablet (0.5 mg total) by mouth every 6 (six) hours as needed for anxiety. (Patient not taking: Reported on 08/19/2020) 60 tablet 0  . dronabinol (MARINOL) 5 MG capsule Take 1 capsule (5 mg total) by mouth 2 (two) times daily before a meal. (Patient not taking: No sig reported) 60 capsule 2  . loperamide (IMODIUM) 2 MG capsule  (Patient not taking: Reported on 08/19/2020)     No current facility-administered medications for this encounter.   Facility-Administered Medications Ordered in Other Encounters  Medication Dose Route Frequency Provider Last Rate Last Admin  . sodium chloride flush (NS) 0.9 % injection 10 mL  10 mL Intravenous PRN Volanda Napoleon, MD   10 mL at 10/21/19 5053    Physical Findings:  vitals were not taken for this visit.   Karen Kays to assess due to telephone follow-up visit format.  Lab Findings: Lab Results  Component Value Date   WBC 5.1 08/17/2020   HGB 10.5 (L) 08/17/2020   HCT 30.8 (L) 08/17/2020   MCV 95.7 08/17/2020   PLT 54 (L) 08/17/2020     Radiographic Findings: NM PET Image Restag (PS) Skull Base To Thigh  Result Date: 08/08/2020 CLINICAL DATA:  Subsequent treatment strategy for small cell lung cancer. EXAM: NUCLEAR MEDICINE PET SKULL BASE TO THIGH TECHNIQUE: 7.7 mCi F-18 FDG was injected intravenously. Full-ring PET imaging was performed from the skull base to thigh after the radiotracer. CT data was obtained and used for attenuation correction and anatomic localization. Fasting blood glucose: 88 mg/dl COMPARISON:  Multiple prior PET CTs.  The most recent is 04/29/2020. FINDINGS: Mediastinal blood pool activity: SUV max 2.43 Liver activity: SUV max NA NECK: No neck mass or lymphadenopathy. No residual or recurrent nodal disease in the right supraclavicular fossa. Incidental CT findings:  Stable advanced bilateral coronary artery calcifications. CHEST: No residual or recurrent mediastinal or hilar lymphadenopathy. No worrisome pulmonary nodules to suggest pulmonary metastatic disease. No axillary adenopathy. Incidental CT findings: Stable vascular calcifications. ABDOMEN/PELVIS: There are few small residual low-attenuation lesions in the liver the 12 mm segment 7 lesion is unchanged. No residual hypermetabolism is demonstrated. No new hepatic lesions. No enlarged or hypermetabolic abdominal or pelvic lymphadenopathy. No adrenal gland lesions. Incidental CT findings: Stable vascular calcifications. SKELETON: No findings suspicious for osseous metastatic disease. Incidental CT findings: none IMPRESSION: 1. Negative PET-CT. No findings for residual or recurrent tumor or metastatic disease. Findings suggest an excellent response to treatment with complete metabolic response. 2. Stable vascular disease. Electronically Signed   By: Marijo Sanes M.D.   On: 08/08/2020  10:09    Impression/Plan: 56. 70 yo male with brain metastases from extensive stage small cell lung cancer. He appears to have recovered well from the effects of his recent salvage SRS brain radiation and currently remains without complaints.  We discussed the plan to obtain a post-treatment MRI brain in 10/2020 to assess treatment response and pending this scan is stable, we would anticipate resuming serial MRI scans of the brain every 3 months for the first 1-2 years and then every 4-6 months thereafter. He does require pre-medication with Ativan to tolerate the MRI scans so I will make sure this is available to him for use prior to each scan. We will follow up by telephone following each scan to review results and recommendation from the multi-disciplinary brain conference.  He will also continue in routine follow-up under the care and direction of Dr. Marin Olp for continued management of his systemic disease.  He and his wife appear to  have a good understanding of these recommendations and are comfortable and in agreement with the stated plan.  They know to call at anytime with any questions or concerns related to his previous radiation.  Given current concerns for patient exposure during the COVID-19 pandemic, this encounter was conducted via telephone. The patient was notified in advance and was offered a [facility] meeting to allow for face to face communication but unfortunately reported that he did not have the appropriate resources/technology to support such a visit and instead preferred to proceed with telephone consult. The patient has given verbal consent for this type of encounter. The time spent during this encounter was 25 minutes. The attendants for this meeting include Tray Klayman PA-C, Mont Dutton, RTT, patient, [place] Foerster and his wife, Kennyth Lose. During the encounter, Ranny Wiebelhaus PA-C, and Mont Dutton, RTT were located at [facility] Radiation Oncology Department.  Patient, Karmine Kauer and his wife, Kennyth Lose  were located at home.    Nicholos Johns, PA-C

## 2020-08-21 ENCOUNTER — Other Ambulatory Visit: Payer: Self-pay | Admitting: *Deleted

## 2020-08-21 DIAGNOSIS — D649 Anemia, unspecified: Secondary | ICD-10-CM

## 2020-08-21 DIAGNOSIS — C7931 Secondary malignant neoplasm of brain: Secondary | ICD-10-CM

## 2020-08-21 DIAGNOSIS — C3490 Malignant neoplasm of unspecified part of unspecified bronchus or lung: Secondary | ICD-10-CM

## 2020-08-24 ENCOUNTER — Other Ambulatory Visit: Payer: Self-pay

## 2020-08-24 ENCOUNTER — Inpatient Hospital Stay: Payer: Medicare Other

## 2020-08-24 VITALS — HR 102

## 2020-08-24 DIAGNOSIS — D649 Anemia, unspecified: Secondary | ICD-10-CM

## 2020-08-24 DIAGNOSIS — C3490 Malignant neoplasm of unspecified part of unspecified bronchus or lung: Secondary | ICD-10-CM

## 2020-08-24 DIAGNOSIS — C7931 Secondary malignant neoplasm of brain: Secondary | ICD-10-CM | POA: Diagnosis not present

## 2020-08-24 DIAGNOSIS — Z5111 Encounter for antineoplastic chemotherapy: Secondary | ICD-10-CM | POA: Diagnosis not present

## 2020-08-24 DIAGNOSIS — C4372 Malignant melanoma of left lower limb, including hip: Secondary | ICD-10-CM | POA: Diagnosis not present

## 2020-08-24 DIAGNOSIS — K219 Gastro-esophageal reflux disease without esophagitis: Secondary | ICD-10-CM | POA: Diagnosis not present

## 2020-08-24 DIAGNOSIS — R410 Disorientation, unspecified: Secondary | ICD-10-CM | POA: Diagnosis not present

## 2020-08-24 DIAGNOSIS — C3431 Malignant neoplasm of lower lobe, right bronchus or lung: Secondary | ICD-10-CM | POA: Diagnosis not present

## 2020-08-24 LAB — CBC WITH DIFFERENTIAL (CANCER CENTER ONLY)
Abs Immature Granulocytes: 0.04 10*3/uL (ref 0.00–0.07)
Basophils Absolute: 0 10*3/uL (ref 0.0–0.1)
Basophils Relative: 0 %
Eosinophils Absolute: 0.1 10*3/uL (ref 0.0–0.5)
Eosinophils Relative: 2 %
HCT: 29.7 % — ABNORMAL LOW (ref 39.0–52.0)
Hemoglobin: 10.4 g/dL — ABNORMAL LOW (ref 13.0–17.0)
Immature Granulocytes: 1 %
Lymphocytes Relative: 31 %
Lymphs Abs: 1.1 10*3/uL (ref 0.7–4.0)
MCH: 32.9 pg (ref 26.0–34.0)
MCHC: 35 g/dL (ref 30.0–36.0)
MCV: 94 fL (ref 80.0–100.0)
Monocytes Absolute: 0.3 10*3/uL (ref 0.1–1.0)
Monocytes Relative: 9 %
Neutro Abs: 2 10*3/uL (ref 1.7–7.7)
Neutrophils Relative %: 57 %
Platelet Count: 51 10*3/uL — ABNORMAL LOW (ref 150–400)
RBC: 3.16 MIL/uL — ABNORMAL LOW (ref 4.22–5.81)
RDW: 18.1 % — ABNORMAL HIGH (ref 11.5–15.5)
WBC Count: 3.6 10*3/uL — ABNORMAL LOW (ref 4.0–10.5)
nRBC: 0 % (ref 0.0–0.2)

## 2020-08-24 LAB — CMP (CANCER CENTER ONLY)
ALT: 18 U/L (ref 0–44)
AST: 12 U/L — ABNORMAL LOW (ref 15–41)
Albumin: 3.9 g/dL (ref 3.5–5.0)
Alkaline Phosphatase: 76 U/L (ref 38–126)
Anion gap: 8 (ref 5–15)
BUN: 24 mg/dL — ABNORMAL HIGH (ref 8–23)
CO2: 28 mmol/L (ref 22–32)
Calcium: 9.2 mg/dL (ref 8.9–10.3)
Chloride: 95 mmol/L — ABNORMAL LOW (ref 98–111)
Creatinine: 1.42 mg/dL — ABNORMAL HIGH (ref 0.61–1.24)
GFR, Estimated: 53 mL/min — ABNORMAL LOW (ref >60)
Glucose, Bld: 124 mg/dL — ABNORMAL HIGH (ref 70–99)
Potassium: 3.9 mmol/L (ref 3.5–5.1)
Sodium: 131 mmol/L — ABNORMAL LOW (ref 135–145)
Total Bilirubin: 0.6 mg/dL (ref 0.3–1.2)
Total Protein: 6.5 g/dL (ref 6.5–8.1)

## 2020-08-24 MED ORDER — PALONOSETRON HCL INJECTION 0.25 MG/5ML
0.2500 mg | Freq: Once | INTRAVENOUS | Status: AC
  Administered 2020-08-24: 0.25 mg via INTRAVENOUS

## 2020-08-24 MED ORDER — SODIUM CHLORIDE 0.9% FLUSH
10.0000 mL | INTRAVENOUS | Status: DC | PRN
  Administered 2020-08-24: 10 mL
  Filled 2020-08-24: qty 10

## 2020-08-24 MED ORDER — PALONOSETRON HCL INJECTION 0.25 MG/5ML
INTRAVENOUS | Status: AC
  Filled 2020-08-24: qty 5

## 2020-08-24 MED ORDER — SODIUM CHLORIDE 0.9 % IV SOLN
Freq: Once | INTRAVENOUS | Status: AC
  Filled 2020-08-24: qty 250

## 2020-08-24 MED ORDER — MAGNESIUM SULFATE 2 GM/50ML IV SOLN: 2.0000 g | Freq: Once | INTRAVENOUS | Status: DC

## 2020-08-24 MED ORDER — COLD PACK MISC ONCOLOGY
1.0000 | Freq: Once | Status: DC | PRN
  Filled 2020-08-24: qty 1

## 2020-08-24 MED ORDER — SODIUM CHLORIDE 0.9 % IV SOLN
10.0000 mg | Freq: Once | INTRAVENOUS | Status: AC
  Administered 2020-08-24: 10 mg via INTRAVENOUS
  Filled 2020-08-24: qty 10

## 2020-08-24 MED ORDER — SODIUM CHLORIDE 0.9 % IV SOLN
30.0000 mg/m2 | Freq: Once | INTRAVENOUS | Status: AC
  Administered 2020-08-24: 56 mg via INTRAVENOUS
  Filled 2020-08-24: qty 50

## 2020-08-24 MED ORDER — SODIUM CHLORIDE 0.9 % IV SOLN: Freq: Once | INTRAVENOUS | Status: DC

## 2020-08-24 MED ORDER — HEPARIN SOD (PORK) LOCK FLUSH 100 UNIT/ML IV SOLN
500.0000 [IU] | Freq: Once | INTRAVENOUS | Status: AC | PRN
  Administered 2020-08-24: 500 [IU]
  Filled 2020-08-24: qty 5

## 2020-08-24 MED ORDER — SODIUM CHLORIDE 0.9 % IV SOLN
150.0000 mg | Freq: Once | INTRAVENOUS | Status: AC
  Administered 2020-08-24: 150 mg via INTRAVENOUS
  Filled 2020-08-24: qty 150

## 2020-08-24 MED ORDER — POTASSIUM CHLORIDE IN NACL 20-0.9 MEQ/L-% IV SOLN: Freq: Once | INTRAVENOUS | Status: DC

## 2020-08-24 MED ORDER — POTASSIUM CHLORIDE IN NACL 20-0.9 MEQ/L-% IV SOLN
Freq: Once | INTRAVENOUS | Status: AC
  Filled 2020-08-24: qty 1000

## 2020-08-24 MED ORDER — MAGNESIUM SULFATE 2 GM/50ML IV SOLN
2.0000 g | Freq: Once | INTRAVENOUS | Status: AC
  Administered 2020-08-24: 2 g via INTRAVENOUS
  Filled 2020-08-24: qty 50

## 2020-08-24 NOTE — Patient Instructions (Signed)

## 2020-08-24 NOTE — Progress Notes (Signed)
OK to treat with HR 100 and blood test values from today per Dr. Marin Olp. OK to run fluids concomitant with the Cisplatin per Dr. Marin Olp.

## 2020-09-05 ENCOUNTER — Other Ambulatory Visit: Payer: Self-pay

## 2020-09-05 ENCOUNTER — Encounter (HOSPITAL_COMMUNITY): Payer: Self-pay | Admitting: *Deleted

## 2020-09-05 ENCOUNTER — Emergency Department (HOSPITAL_COMMUNITY): Payer: Medicare Other

## 2020-09-05 ENCOUNTER — Emergency Department (HOSPITAL_COMMUNITY)
Admission: EM | Admit: 2020-09-05 | Discharge: 2020-09-06 | Disposition: A | Discharge status: Home / Self Care | Payer: Medicare Other | Attending: Emergency Medicine | Admitting: Emergency Medicine

## 2020-09-05 DIAGNOSIS — Z87891 Personal history of nicotine dependence: Secondary | ICD-10-CM | POA: Diagnosis not present

## 2020-09-05 DIAGNOSIS — D649 Anemia, unspecified: Secondary | ICD-10-CM | POA: Insufficient documentation

## 2020-09-05 DIAGNOSIS — Z85828 Personal history of other malignant neoplasm of skin: Secondary | ICD-10-CM | POA: Diagnosis not present

## 2020-09-05 DIAGNOSIS — Z85841 Personal history of malignant neoplasm of brain: Secondary | ICD-10-CM | POA: Diagnosis not present

## 2020-09-05 DIAGNOSIS — I10 Essential (primary) hypertension: Secondary | ICD-10-CM | POA: Insufficient documentation

## 2020-09-05 DIAGNOSIS — Z79899 Other long term (current) drug therapy: Secondary | ICD-10-CM | POA: Diagnosis not present

## 2020-09-05 DIAGNOSIS — R456 Violent behavior: Secondary | ICD-10-CM | POA: Diagnosis not present

## 2020-09-05 DIAGNOSIS — R569 Unspecified convulsions: Secondary | ICD-10-CM

## 2020-09-05 DIAGNOSIS — R410 Disorientation, unspecified: Secondary | ICD-10-CM | POA: Diagnosis not present

## 2020-09-05 DIAGNOSIS — Z85110 Personal history of malignant carcinoid tumor of bronchus and lung: Secondary | ICD-10-CM | POA: Diagnosis not present

## 2020-09-05 DIAGNOSIS — R000 Tachycardia, unspecified: Secondary | ICD-10-CM | POA: Diagnosis not present

## 2020-09-05 LAB — CBC WITH DIFFERENTIAL/PLATELET
Abs Immature Granulocytes: 0.06 10*3/uL (ref 0.00–0.07)
Basophils Absolute: 0 10*3/uL (ref 0.0–0.1)
Basophils Relative: 0 %
Eosinophils Absolute: 0 10*3/uL (ref 0.0–0.5)
Eosinophils Relative: 1 %
HCT: 22.7 % — ABNORMAL LOW (ref 39.0–52.0)
Hemoglobin: 7.9 g/dL — ABNORMAL LOW (ref 13.0–17.0)
Immature Granulocytes: 3 %
Lymphocytes Relative: 42 %
Lymphs Abs: 0.9 10*3/uL (ref 0.7–4.0)
MCH: 33.2 pg (ref 26.0–34.0)
MCHC: 34.8 g/dL (ref 30.0–36.0)
MCV: 95.4 fL (ref 80.0–100.0)
Monocytes Absolute: 0.2 10*3/uL (ref 0.1–1.0)
Monocytes Relative: 9 %
Neutro Abs: 1 10*3/uL — ABNORMAL LOW (ref 1.7–7.7)
Neutrophils Relative %: 45 %
Platelets: 31 10*3/uL — ABNORMAL LOW (ref 150–400)
RBC: 2.38 MIL/uL — ABNORMAL LOW (ref 4.22–5.81)
RDW: 17.6 % — ABNORMAL HIGH (ref 11.5–15.5)
WBC: 2.2 10*3/uL — ABNORMAL LOW (ref 4.0–10.5)
nRBC: 0 % (ref 0.0–0.2)

## 2020-09-05 LAB — BASIC METABOLIC PANEL
Anion gap: 12 (ref 5–15)
BUN: 16 mg/dL (ref 8–23)
CO2: 24 mmol/L (ref 22–32)
Calcium: 8.5 mg/dL — ABNORMAL LOW (ref 8.9–10.3)
Chloride: 94 mmol/L — ABNORMAL LOW (ref 98–111)
Creatinine, Ser: 1.56 mg/dL — ABNORMAL HIGH (ref 0.61–1.24)
GFR, Estimated: 47 mL/min — ABNORMAL LOW (ref >60)
Glucose, Bld: 112 mg/dL — ABNORMAL HIGH (ref 70–99)
Potassium: 4.1 mmol/L (ref 3.5–5.1)
Sodium: 130 mmol/L — ABNORMAL LOW (ref 135–145)

## 2020-09-05 LAB — PREPARE RBC (CROSSMATCH)

## 2020-09-05 IMAGING — CT CT HEAD W/O CM
4 series · 17 of 47 positions shown, 19 images · non-contrast
Comparison: CT [DATE], MRI [DATE]

CLINICAL DATA: Seizure

EXAM:
CT HEAD WITHOUT CONTRAST
TECHNIQUE: Contiguous axial images were obtained from the base of the skull
through the vertex without intravenous contrast.

[Series 2: head without · axial · non-contrast · 0.43mm/px · z∈[-142,-12]mm · 7 of 36 slices shown, 9 images]
[im 5/36  brain]
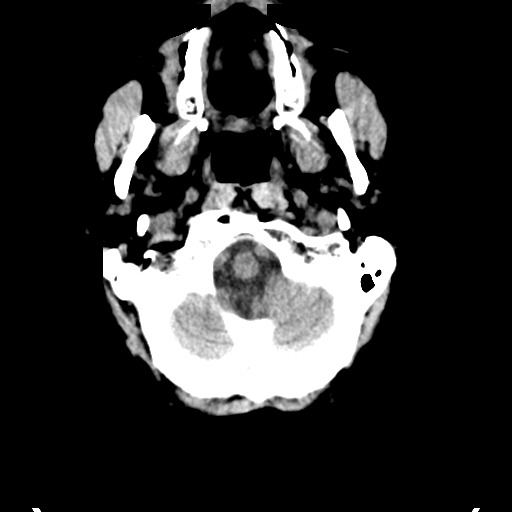
[im 5/36  bone]
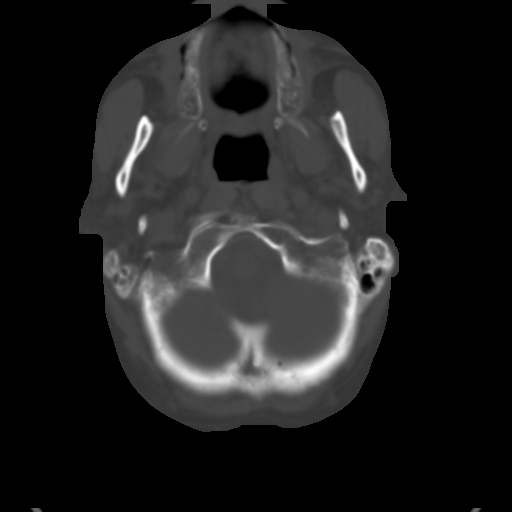
[im 9/36  brain]
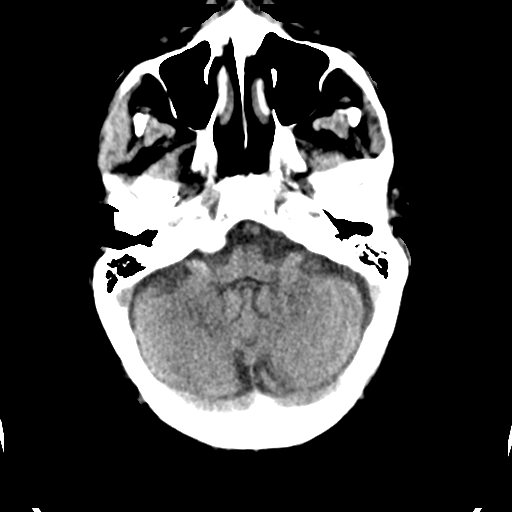
[im 14/36  brain]
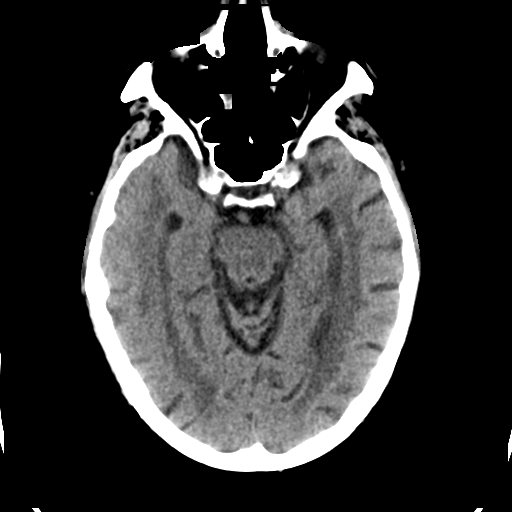
[im 18/36  brain]
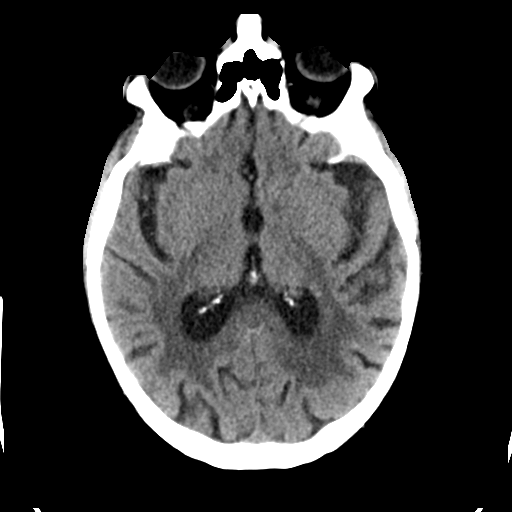
[im 22/36  brain]
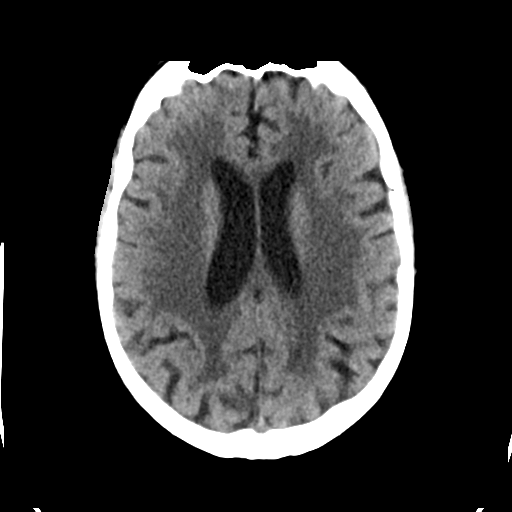
[im 22/36  bone]
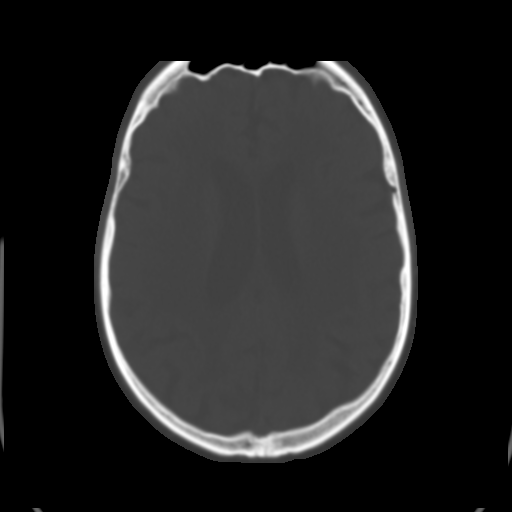
[im 27/36  brain]
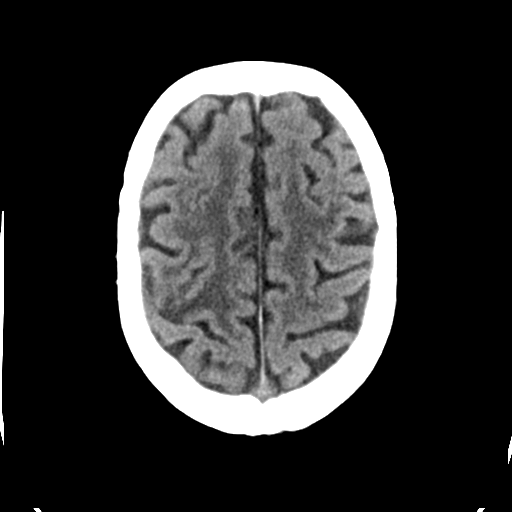
[im 31/36  brain]
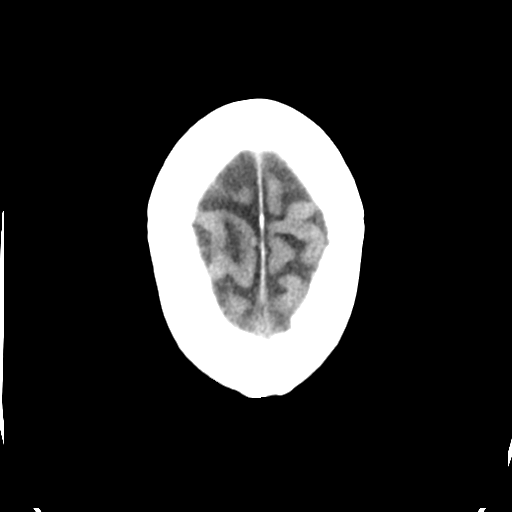

[Series 3: head bone · axial · 0.43mm/px · z∈[-146,-84]mm · 4 of 89 slices shown]
[im 9/89  bone]
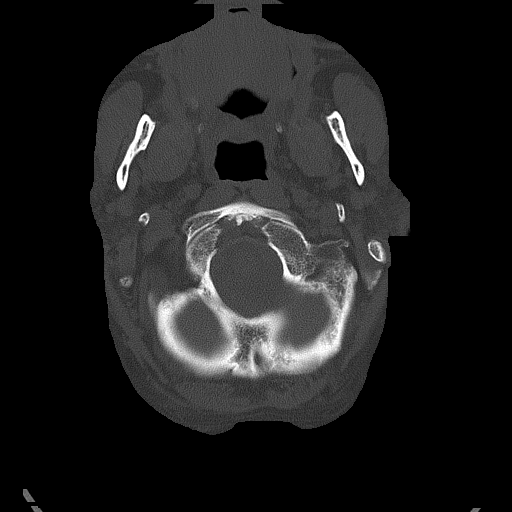
[im 18/89  bone]
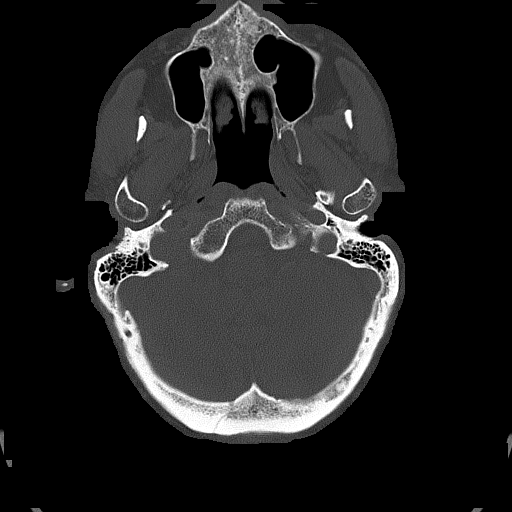
[im 27/89  bone]
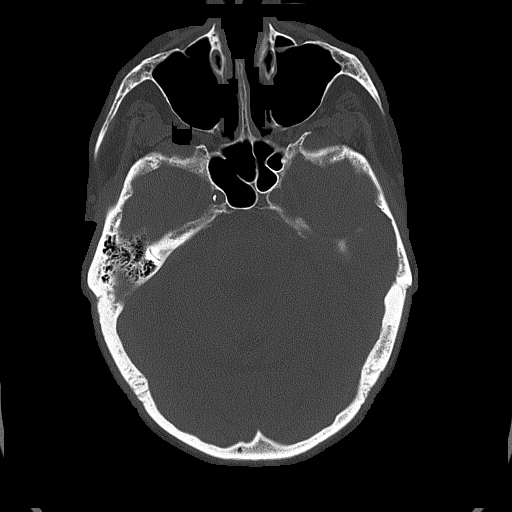
[im 40/89  bone]
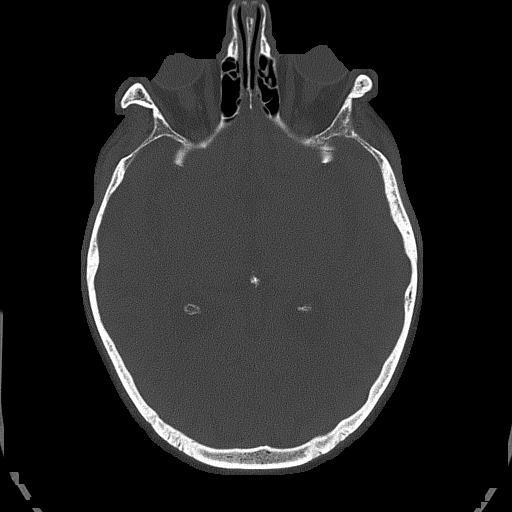

[Series 4: head without cor · coronal · non-contrast · 0.30mm/px · 3 of 67 slices shown]
[im 23/67  brain]
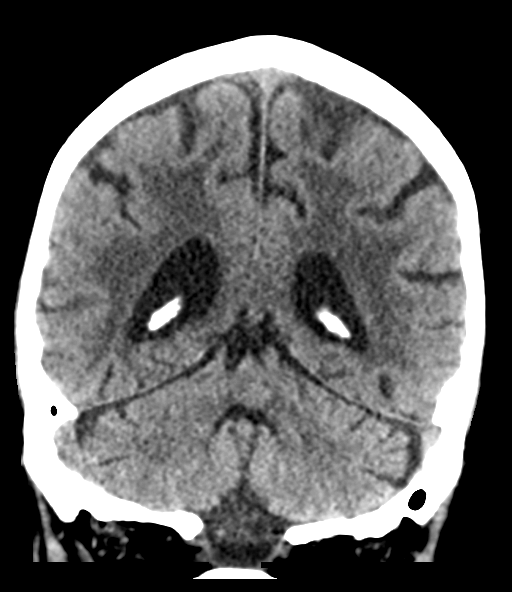
[im 30/67  brain]
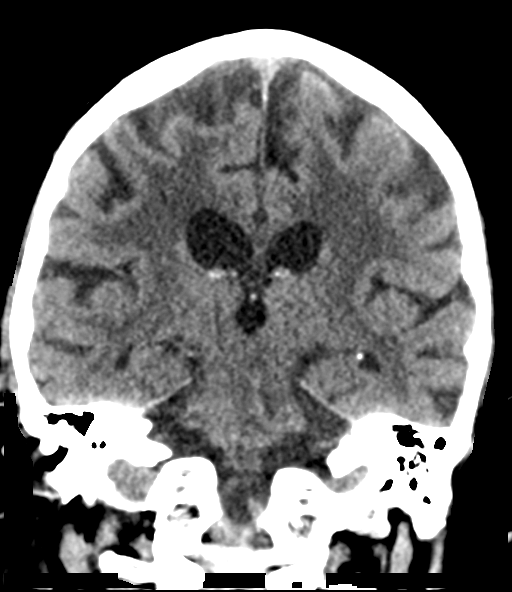
[im 37/67  brain]
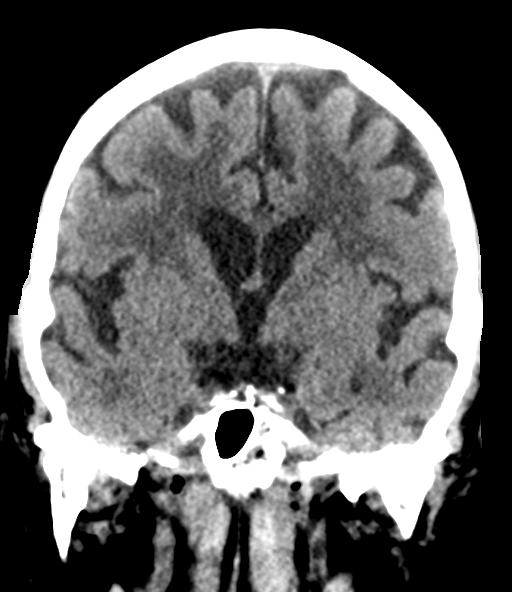

[Series 5: head without sag · sagittal · non-contrast · 0.35mm/px · 3 of 58 slices shown]
[im 20/58  brain]
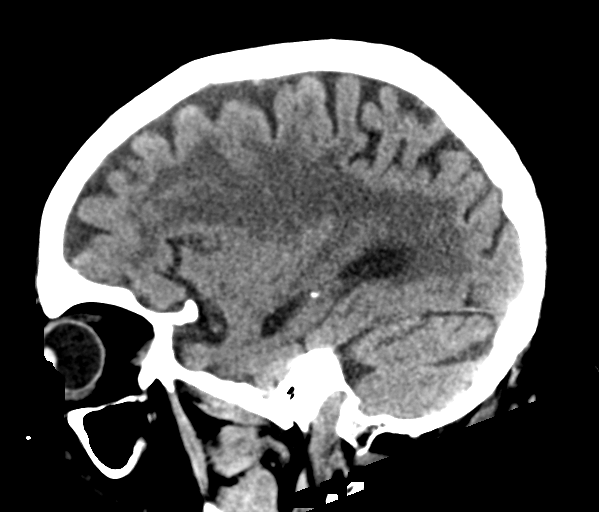
[im 29/58  brain]
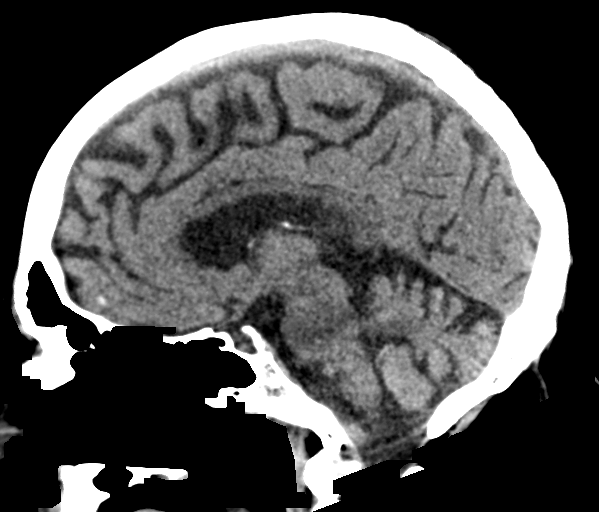
[im 39/58  brain]
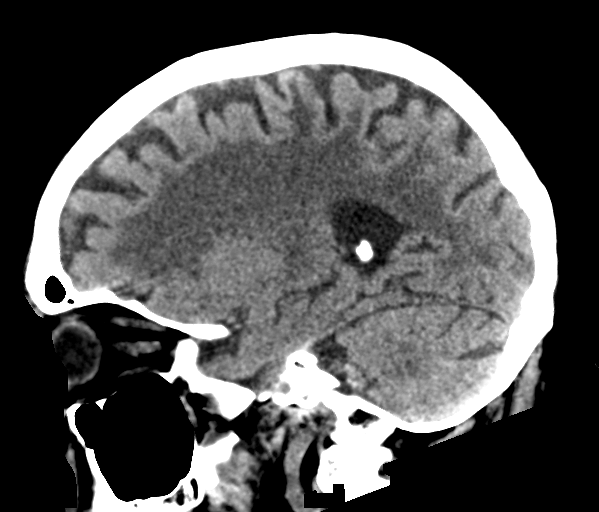

[17 of 47 positions shown; findings below may reference images not displayed]

FINDINGS: Brain: There is atrophy and chronic small vessel disease changes.
Previously seen metastases by MRI not visible on noncontrast CT. No
acute infarct or hemorrhage. No hydrocephalus.

Vascular: No hyperdense vessel or unexpected calcification.

Skull: No acute calvarial abnormality.

Sinuses/Orbits: No acute findings

Other: None
IMPRESSION: Atrophy, chronic microvascular disease.

Previously seen metastatic disease on MRI not appreciable by
noncontrast CT.

## 2020-09-05 MED ORDER — SODIUM CHLORIDE 0.9 % IV SOLN: 10.0000 mL/h | Freq: Once | INTRAVENOUS | Status: DC

## 2020-09-05 MED ORDER — LEVETIRACETAM 500 MG PO TABS: 500.0000 mg | ORAL_TABLET | Freq: Two times a day (BID) | ORAL | 0 refills | Status: DC

## 2020-09-05 MED ORDER — LEVETIRACETAM IN NACL 1500 MG/100ML IV SOLN
1500.0000 mg | Freq: Once | INTRAVENOUS | Status: AC
  Administered 2020-09-05: 1500 mg via INTRAVENOUS
  Filled 2020-09-05: qty 100

## 2020-09-05 NOTE — ED Notes (Signed)
[phone] pt wife would like to know about his labs

## 2020-09-05 NOTE — ED Provider Notes (Signed)
[facility] EMERGENCY DEPARTMENT Provider Note   CSN: [id number] Arrival date & time: 09/05/20  2115     History Chief Complaint  Patient presents with  . Seizures    Walter Hernandez is a 70 y.o. male.  Patient's wife describes a new onset seizure-like episode today.  She states the patient was sitting at dinner when he suddenly had left hand shaking.  She has not was going on he said he was not sure why his head was doing this.  And then he became unresponsive and he had both hands shaking.  Symptoms lasted for about 3 to 4 minutes.  She laid him down and ultimately symptoms resolved spontaneously.  Patient is now back to normal baseline mental status.  Denies headache or chest pain or abdominal pain.  No prior episodes of seizures.  Patient does have a history of lung cancer with brain metastases followed by oncology.        Past Medical History:  Diagnosis Date  . Cancer [facility])    recently discovered melanoma  . GERD (gastroesophageal reflux disease)   . Goals of care, counseling/discussion 06/19/2019  . History of hiatal hernia   . Hypertension    recently placed on new blood pressure med  . Malignant melanoma of skin of left lower leg ([place]) 07/28/2015    Patient Active Problem List   Diagnosis Date Noted  . Goals of care, counseling/discussion 06/19/2019  . 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 Brain metastases ([place]) 06/03/2019  . Essential hypertension 06/03/2019  . GERD (gastroesophageal reflux disease) 06/03/2019  . Malignant melanoma of skin of left lower leg ([place]) 07/28/2015    Past Surgical History:  Procedure Laterality Date  . BRONCHIAL NEEDLE ASPIRATION BIOPSY  06/05/2019   Procedure: Bronchial Needle Aspiration Biopsies;  Surgeon: Candee Furbish, MD;  Location: [place];  Service: Thoracic;;  . CARDIAC CATHETERIZATION     15 yrs ago...came out normal   -- hiatal hernia  . COLONOSCOPY  2012   Dr Earlean Shawl   .  ENDOBRONCHIAL ULTRASOUND N/A 06/11/2019   Procedure: ENDOBRONCHIAL ULTRASOUND;  Surgeon: Garner Nash, DO;  Location: [place];  Service: Endoscopy;  Laterality: N/A;  . ESOPHAGOGASTRODUODENOSCOPY  2012   Dr Earlean Shawl. has a really bad hiatal hernia   . FINE NEEDLE ASPIRATION  06/11/2019   Procedure: FINE NEEDLE ASPIRATION (FNA) LINEAR;  Surgeon: Garner Nash, DO;  Location: [place];  Service: Endoscopy;;  . IR IMAGING GUIDED PORT INSERTION  07/03/2019  . MELANOMA EXCISION WITH SENTINEL LYMPH NODE BIOPSY Left 09/29/2014   Procedure: WIDE EXCISION MELANOMA LEFT CALF WITH LEFT INGUINAL  SENTINEL LYMPH NODE BIOPSY;  Surgeon: Georganna Skeans, MD;  Location: [place];  Service: General;  Laterality: Left;  . skin lesion excised     non cancerous  . VIDEO BRONCHOSCOPY N/A 06/11/2019   Procedure: VIDEO BRONCHOSCOPY WITHOUT FLUORO;  Surgeon: Garner Nash, DO;  Location: [place];  Service: Endoscopy;  Laterality: N/A;  . VIDEO BRONCHOSCOPY WITH ENDOBRONCHIAL ULTRASOUND N/A 06/05/2019   Procedure: VIDEO BRONCHOSCOPY WITH ENDOBRONCHIAL ULTRASOUND;  Surgeon: Candee Furbish, MD;  Location: [facility] OR;  Service: Thoracic;  Laterality: N/A;       Family History  Problem Relation Age of Onset  . Colon cancer Neg Hx   . Esophageal cancer Neg Hx     Social History   Tobacco Use  . Smoking status: Former Smoker    Packs/day: 1.00  Years: 50.00    Pack years: 50.00    Types: Cigarettes  . Smokeless tobacco: Never Used  . Tobacco comment: quit  06/03/2019  Vaping Use  . Vaping Use: Never used  Substance Use Topics  . Alcohol use: Not Currently    Alcohol/week: 0.0 standard drinks    Comment: socially....beer  wine & liquor  . Drug use: No    Home Medications Prior to Admission medications   Medication Sig Start Date End Date Taking? Authorizing Provider  levETIRAcetam (KEPPRA) 500 MG tablet Take 1 tablet (500 mg total) by mouth 2 (two) times daily. 09/05/20  Yes Luna Fuse, MD   acetaminophen (TYLENOL) 500 MG tablet Take 1,000 mg by mouth every 6 (six) hours as needed for moderate pain.    [provider]  ALPRAZolam Duanne Moron) 0.5 MG tablet Take 1 tablet (0.5 mg total) by mouth every 6 (six) hours as needed for anxiety. Patient not taking: Reported on 08/19/2020 06/10/19   Volanda Napoleon, MD  dexamethasone (DECADRON) 4 MG tablet Dexamethasone 4 mg TID x 2 days, BID x 2 days, then 2 mg (1/2 tab) BID x 7 days, then 2 mg (1/2 tab) QD x 7 days then stop. 07/27/20   Hayden Pedro, PA-C  diphenoxylate-atropine (LOMOTIL) 2.5-0.025 MG tablet Take 2 tablets at onset of diarrhea. Take one tablet after each loose stool. Max 8 tablets per day. 05/12/20   Volanda Napoleon, MD  dronabinol (MARINOL) 5 MG capsule Take 1 capsule (5 mg total) by mouth 2 (two) times daily before a meal. Patient not taking: No sig reported 09/10/19   [place], Holli Humbles, NP  finasteride (PROSCAR) 5 MG tablet TAKE 1 TABLET BY MOUTH EVERY DAY 03/12/20   Volanda Napoleon, MD  furosemide (LASIX) 20 MG tablet Take 2 tablets (40 mg total) by mouth daily. 08/01/19   Volanda Napoleon, MD  lidocaine-prilocaine (EMLA) cream Apply to affected area once 03/23/20   Volanda Napoleon, MD  loperamide (IMODIUM) 2 MG capsule  03/24/20   [provider]  LORazepam (ATIVAN) 0.5 MG tablet Take 1 tablet (0.5 mg total) by mouth every 6 (six) hours as needed (Nausea or vomiting). 03/24/20   [place], Holli Humbles, NP  Multiple Vitamin (MULTIVITAMIN) capsule Take 1 capsule by mouth daily.    [provider]  Naphazoline-Pheniramine (OPCON-A OP) Place 2 drops into both eyes as needed (for dry eyes).    [provider]  omeprazole (PRILOSEC) 40 MG capsule Take 1 capsule (40 mg total) by mouth 2 (two) times daily. 10/03/19   Jackquline Denmark, MD  ondansetron (ZOFRAN) 4 MG tablet Take 1 tablet (4 mg total) by mouth every 6 (six) hours as needed for nausea. 06/05/19   Little Ishikawa, MD  ondansetron  (ZOFRAN) 8 MG tablet Take 1 tablet (8 mg total) by mouth 2 (two) times daily as needed. Start on the third day after cisplatin chemotherapy. 03/23/20   Volanda Napoleon, MD  ondansetron (ZOFRAN-ODT) 8 MG disintegrating tablet Take 8 mg by mouth.    [provider]  prochlorperazine (COMPAZINE) 10 MG tablet Take 1 tablet (10 mg total) by mouth every 6 (six) hours as needed (Nausea or vomiting). 03/23/20   Volanda Napoleon, MD  tamsulosin (FLOMAX) 0.4 MG CAPS capsule Take 1 capsule (0.4 mg total) by mouth daily. 01/21/20   Volanda Napoleon, MD    Allergies    Patient has no known allergies.  Review of Systems  Review of Systems  Constitutional: Negative for fever.  HENT: Negative for ear pain and sore throat.   Eyes: Negative for pain.  Respiratory: Negative for cough.   Cardiovascular: Negative for chest pain.  Gastrointestinal: Negative for abdominal pain.  Genitourinary: Negative for flank pain.  Musculoskeletal: Negative for back pain.  Skin: Negative for color change and rash.  Neurological: Negative for syncope.  All other systems reviewed and are negative.   Physical Exam Updated Vital Signs BP 112/71   Pulse 91   Temp (!) 97.1 F (36.2 C) (Temporal)   Resp 18   SpO2 97%   Physical Exam Constitutional:      General: He is not in acute distress.    Appearance: He is well-developed.  HENT:     Head: Normocephalic.     Nose: Nose normal.  Eyes:     Extraocular Movements: Extraocular movements intact.  Cardiovascular:     Rate and Rhythm: Normal rate.  Pulmonary:     Effort: Pulmonary effort is normal.  Skin:    Coloration: Skin is not jaundiced.  Neurological:     General: No focal deficit present.     Mental Status: He is alert and oriented to person, place, and time. Mental status is at baseline.     Cranial Nerves: No cranial nerve deficit.     Motor: No weakness.     Gait: Gait normal.     ED Results / Procedures / Treatments   Labs (all labs  ordered are listed, but only abnormal results are displayed) Labs Reviewed  CBC WITH DIFFERENTIAL/PLATELET - Abnormal; Notable for the following components:      Result Value   WBC 2.2 (*)    RBC 2.38 (*)    Hemoglobin 7.9 (*)    HCT 22.7 (*)    RDW 17.6 (*)    Platelets 31 (*)    Neutro Abs 1.0 (*)    All other components within normal limits  BASIC METABOLIC PANEL - Abnormal; Notable for the following components:   Sodium 130 (*)    Chloride 94 (*)    Glucose, Bld 112 (*)    Creatinine, Ser 1.56 (*)    Calcium 8.5 (*)    GFR, Estimated 47 (*)    All other components within normal limits  PREPARE RBC (CROSSMATCH)    EKG None  Radiology CT Head Wo Contrast  Result Date: 09/05/2020 CLINICAL DATA:  Seizure EXAM: CT HEAD WITHOUT CONTRAST TECHNIQUE: Contiguous axial images were obtained from the base of the skull through the vertex without intravenous contrast. COMPARISON:  CT 06/03/2019, MRI 07/10/2020 FINDINGS: Brain: There is atrophy and chronic small vessel disease changes. Previously seen metastases by MRI not visible on noncontrast CT. No acute infarct or hemorrhage. No hydrocephalus. Vascular: No hyperdense vessel or unexpected calcification. Skull: No acute calvarial abnormality. Sinuses/Orbits: No acute findings Other: None IMPRESSION: Atrophy, chronic microvascular disease. Previously seen metastatic disease on MRI not appreciable by noncontrast CT. Electronically Signed   By: Rolm Baptise M.D.   On: 09/05/2020 21:41    Procedures .Critical Care E&M Performed by: Luna Fuse, MD  Critical care provider statement:    Critical care time (minutes):  30   Critical care time was exclusive of:  Separately billable procedures and treating other patients and teaching time   Critical care was necessary to treat or prevent imminent or life-threatening deterioration of the following conditions:  Circulatory failure After initial E/M assessment, critical care services were  subsequently performed that were exclusive of separately billable procedures or treatment.   Comments:     Severe anemia requiring blood transfusion     Medications Ordered in ED Medications  0.9 %  sodium chloride infusion (has no administration in time range)  levETIRAcetam (KEPPRA) IVPB 1500 mg/ 100 mL premix (0 mg Intravenous Stopped 09/05/20 2219)    ED Course  I have reviewed the triage vital signs and the nursing notes.  Pertinent labs & imaging results that were available during my care of the patient were reviewed by me and considered in my medical decision making (see chart for details).    MDM Rules/Calculators/A&P                          Patient has normal vital signs, is neuro intact at this time.  CT of the brain shows no midline shift or any pathology.  Labs show recurrent anemia of chronic disease hemoglobin 7.6.  It appears patient was transfused in the recent past for similar hemoglobin levels.  Will be transfused 1 unit here prior to discharge.  Case discussed with neurology.  They recommend Keppra loading dose and 500 twice daily and to follow-up with his neurologist on an outpatient basis.  Patient advised not to drive or operate machinery.  Discharged home in stable condition.   Final Clinical Impression(s) / ED Diagnoses Final diagnoses:  Seizure ([place])  Severe anemia    Rx / DC Orders ED Discharge Orders         Ordered    levETIRAcetam (KEPPRA) 500 MG tablet  2 times daily        09/05/20 2315           Luna Fuse, MD 09/05/20 2315

## 2020-09-05 NOTE — ED Triage Notes (Signed)
Pt from home by EMS. Wife called EMS after pt had seizure like activity at home, no known hx of seizures. Pt is receiving radiation from lung cancer that has metastasized to the brain. Last treatment was a week ago.

## 2020-09-05 NOTE — Discharge Instructions (Signed)
Call your neuro oncologist Monday morning to make an appointment.  Do not drive or operate machinery.  Return immediately if you have fevers weakness headaches recurrent seizures or any additional concerns.

## 2020-09-06 ENCOUNTER — Other Ambulatory Visit: Payer: Self-pay | Admitting: Hematology & Oncology

## 2020-09-06 DIAGNOSIS — R569 Unspecified convulsions: Secondary | ICD-10-CM | POA: Diagnosis not present

## 2020-09-06 MED ORDER — ACETAMINOPHEN 325 MG PO TABS
650.0000 mg | ORAL_TABLET | Freq: Once | ORAL | Status: AC
  Administered 2020-09-06: 650 mg via ORAL
  Filled 2020-09-06: qty 2

## 2020-09-06 NOTE — ED Provider Notes (Signed)
Blood transfusion complete. Platelets slightly lower today at 30,000 from baseline of around 50.  No further seizure activity.  Patient knows not to drive after heavy machinery.  He is given prescription for Keppra per plan of Dr. Almyra Free.  Follow-up with his specialist.  Return precautions discussed   Ezequiel Essex, MD 09/06/20 [phone]

## 2020-09-07 ENCOUNTER — Inpatient Hospital Stay: Payer: Medicare Other | Attending: Hematology & Oncology

## 2020-09-07 ENCOUNTER — Encounter: Payer: Self-pay | Admitting: Hematology & Oncology

## 2020-09-07 ENCOUNTER — Inpatient Hospital Stay: Payer: Medicare Other

## 2020-09-07 ENCOUNTER — Other Ambulatory Visit: Payer: Self-pay

## 2020-09-07 ENCOUNTER — Inpatient Hospital Stay (HOSPITAL_BASED_OUTPATIENT_CLINIC_OR_DEPARTMENT_OTHER): Payer: Medicare Other | Admitting: Hematology & Oncology

## 2020-09-07 VITALS — Wt 151.0 lb

## 2020-09-07 DIAGNOSIS — R634 Abnormal weight loss: Secondary | ICD-10-CM | POA: Insufficient documentation

## 2020-09-07 DIAGNOSIS — C7931 Secondary malignant neoplasm of brain: Secondary | ICD-10-CM | POA: Insufficient documentation

## 2020-09-07 DIAGNOSIS — C3431 Malignant neoplasm of lower lobe, right bronchus or lung: Secondary | ICD-10-CM | POA: Diagnosis not present

## 2020-09-07 DIAGNOSIS — Z79899 Other long term (current) drug therapy: Secondary | ICD-10-CM | POA: Insufficient documentation

## 2020-09-07 DIAGNOSIS — Z5111 Encounter for antineoplastic chemotherapy: Secondary | ICD-10-CM | POA: Insufficient documentation

## 2020-09-07 DIAGNOSIS — C3490 Malignant neoplasm of unspecified part of unspecified bronchus or lung: Secondary | ICD-10-CM

## 2020-09-07 DIAGNOSIS — C787 Secondary malignant neoplasm of liver and intrahepatic bile duct: Secondary | ICD-10-CM | POA: Insufficient documentation

## 2020-09-07 DIAGNOSIS — R569 Unspecified convulsions: Secondary | ICD-10-CM | POA: Insufficient documentation

## 2020-09-07 DIAGNOSIS — C4372 Malignant melanoma of left lower limb, including hip: Secondary | ICD-10-CM

## 2020-09-07 LAB — CBC WITH DIFFERENTIAL (CANCER CENTER ONLY)
Abs Immature Granulocytes: 0.03 10*3/uL (ref 0.00–0.07)
Basophils Absolute: 0 10*3/uL (ref 0.0–0.1)
Basophils Relative: 0 %
Eosinophils Absolute: 0 10*3/uL (ref 0.0–0.5)
Eosinophils Relative: 1 %
HCT: 26.1 % — ABNORMAL LOW (ref 39.0–52.0)
Hemoglobin: 9.3 g/dL — ABNORMAL LOW (ref 13.0–17.0)
Immature Granulocytes: 1 %
Lymphocytes Relative: 53 %
Lymphs Abs: 1.1 10*3/uL (ref 0.7–4.0)
MCH: 33 pg (ref 26.0–34.0)
MCHC: 35.6 g/dL (ref 30.0–36.0)
MCV: 92.6 fL (ref 80.0–100.0)
Monocytes Absolute: 0.2 10*3/uL (ref 0.1–1.0)
Monocytes Relative: 11 %
Neutro Abs: 0.7 10*3/uL — ABNORMAL LOW (ref 1.7–7.7)
Neutrophils Relative %: 34 %
Platelet Count: 39 10*3/uL — ABNORMAL LOW (ref 150–400)
RBC: 2.82 MIL/uL — ABNORMAL LOW (ref 4.22–5.81)
RDW: 16.8 % — ABNORMAL HIGH (ref 11.5–15.5)
WBC Count: 2.1 10*3/uL — ABNORMAL LOW (ref 4.0–10.5)
nRBC: 0 % (ref 0.0–0.2)

## 2020-09-07 LAB — CMP (CANCER CENTER ONLY)
ALT: 10 U/L (ref 0–44)
AST: 13 U/L — ABNORMAL LOW (ref 15–41)
Albumin: 3.8 g/dL (ref 3.5–5.0)
Alkaline Phosphatase: 79 U/L (ref 38–126)
Anion gap: 10 (ref 5–15)
BUN: 12 mg/dL (ref 8–23)
CO2: 24 mmol/L (ref 22–32)
Calcium: 9.4 mg/dL (ref 8.9–10.3)
Chloride: 96 mmol/L — ABNORMAL LOW (ref 98–111)
Creatinine: 1.4 mg/dL — ABNORMAL HIGH (ref 0.61–1.24)
GFR, Estimated: 54 mL/min — ABNORMAL LOW (ref >60)
Glucose, Bld: 116 mg/dL — ABNORMAL HIGH (ref 70–99)
Potassium: 3.8 mmol/L (ref 3.5–5.1)
Sodium: 130 mmol/L — ABNORMAL LOW (ref 135–145)
Total Bilirubin: 0.4 mg/dL (ref 0.3–1.2)
Total Protein: 6.5 g/dL (ref 6.5–8.1)

## 2020-09-07 LAB — TYPE AND SCREEN
ABO/RH(D): O POS
Antibody Screen: NEGATIVE
Unit division: 0

## 2020-09-07 LAB — BPAM RBC
Blood Product Expiration Date: 202205012359
ISSUE DATE / TIME: 202204030232
Unit Type and Rh: 5100

## 2020-09-07 LAB — SAVE SMEAR(SSMR), FOR PROVIDER SLIDE REVIEW

## 2020-09-07 LAB — LACTATE DEHYDROGENASE: LDH: 129 U/L (ref 98–192)

## 2020-09-07 NOTE — Progress Notes (Signed)
Hematology and Oncology Follow Up Visit  Walter Hernandez [id number] 17-Jun-1950 70 y.o. 09/07/2020   Principle Diagnosis:  Stage IB (T2aN0M0) superficial spreading melanoma of the left lower leg  Small Cell Lung Cancer -- Extensive Stage -- lung/liver/brain mets  Past Therapy: Cranial XRT -- 06/19/2019 thru 07/02/2019  Current Therapy:        Carboplatin/VP-16/Tecentriq -- started on 07/07/2018, s/p cycle #4 Tecentriq - maintenance -- cycle#1 -- start on 10/01/2019 - d/c on 10/21/2019 Zepzelca/Keytruda -- q 3 wk --s/p cycle #6 - started on 10/28/2019 -- d/c on 03/15/2020 CDDP/Irinotecan -- s/p cycle #5 -- started on 03/23/2020   Interim History:  Walter Hernandez is here today for follow-up.  Unfortunately, he was in the emergency room yesterday.  He had a seizure at home.  This was not a [place] seizure.  He had a CT scan of the ER.  This really was unremarkable.  He did not have an MRI.  He really needs to have an MRI.  He was placed onto Keppra.  His wife unfortunately fractured her right wrist.  This also happened on Saturday.  She has a cast on.  His blood counts clearly are too low to be treated.  He really needs to have a couple of weeks off.  I really would like to see his blood counts improve.  I just feel bad that he had this seizure.  He has never had one before.  There is no bowel or bladder incontinence.  He has not had diarrhea.  He has had no bleeding.  He has had no double vision or blurred vision.  His weight is holding steady right now which is encouraging.  Overall, I would say his performance status is ECOG 2.    Medications:  Allergies as of 09/07/2020   No Known Allergies     Medication List       Accurate as of September 07, 2020  8:54 AM. If you have any questions, ask your nurse or doctor.        acetaminophen 500 MG tablet Commonly known as: TYLENOL Take 1,000 mg by mouth every 6 (six) hours as needed for moderate pain.   ALPRAZolam 0.5 MG  tablet Commonly known as: XANAX Take 1 tablet (0.5 mg total) by mouth every 6 (six) hours as needed for anxiety.   dexamethasone 4 MG tablet Commonly known as: DECADRON Dexamethasone 4 mg TID x 2 days, BID x 2 days, then 2 mg (1/2 tab) BID x 7 days, then 2 mg (1/2 tab) QD x 7 days then stop.   diphenoxylate-atropine 2.5-0.025 MG tablet Commonly known as: LOMOTIL Take 2 tablets at onset of diarrhea. Take one tablet after each loose stool. Max 8 tablets per day.   dronabinol 5 MG capsule Commonly known as: MARINOL Take 1 capsule (5 mg total) by mouth 2 (two) times daily before a meal.   finasteride 5 MG tablet Commonly known as: PROSCAR TAKE 1 TABLET BY MOUTH EVERY DAY   furosemide 20 MG tablet Commonly known as: LASIX Take 2 tablets (40 mg total) by mouth daily.   levETIRAcetam 500 MG tablet Commonly known as: Keppra Take 1 tablet (500 mg total) by mouth 2 (two) times daily.   lidocaine-prilocaine cream Commonly known as: EMLA Apply to affected area once   loperamide 2 MG capsule Commonly known as: IMODIUM   LORazepam 0.5 MG tablet Commonly known as: Ativan Take 1 tablet (0.5 mg total) by mouth every 6 (six) hours as needed (  Nausea or vomiting).   multivitamin capsule Take 1 capsule by mouth daily.   omeprazole 40 MG capsule Commonly known as: PRILOSEC Take 1 capsule (40 mg total) by mouth 2 (two) times daily.   ondansetron 4 MG tablet Commonly known as: ZOFRAN Take 1 tablet (4 mg total) by mouth every 6 (six) hours as needed for nausea.   ondansetron 8 MG tablet Commonly known as: Zofran Take 1 tablet (8 mg total) by mouth 2 (two) times daily as needed. Start on the third day after cisplatin chemotherapy.   ondansetron 8 MG disintegrating tablet Commonly known as: ZOFRAN-ODT Take 8 mg by mouth.   OPCON-A OP Place 2 drops into both eyes as needed (for dry eyes).   prochlorperazine 10 MG tablet Commonly known as: COMPAZINE Take 1 tablet (10 mg total) by  mouth every 6 (six) hours as needed (Nausea or vomiting).   tamsulosin 0.4 MG Caps capsule Commonly known as: FLOMAX Take 1 capsule (0.4 mg total) by mouth daily.       Allergies: No Known Allergies  Past Medical History, Surgical history, Social history, and Family History were reviewed and updated.  Review of Systems: Review of Systems  Constitutional: Positive for weight loss.  HENT: Negative.   Eyes: Negative.   Respiratory: Negative.   Cardiovascular: Negative.   Gastrointestinal: Negative.   Genitourinary: Negative.   Musculoskeletal: Negative.   Skin: Negative.   Neurological: Negative.   Endo/Heme/Allergies: Negative.   Psychiatric/Behavioral: Negative.      Physical Exam:  weight is 151 lb (68.5 kg).   Wt Readings from Last 3 Encounters:  09/07/20 151 lb (68.5 kg)  08/10/20 150 lb 0.6 oz (68.1 kg)  08/07/20 151 lb 12.8 oz (68.9 kg)    Physical Exam Vitals reviewed.  HENT:     Head: Normocephalic and atraumatic.  Eyes:     Pupils: Pupils are equal, round, and reactive to light.  Cardiovascular:     Rate and Rhythm: Normal rate and regular rhythm.     Heart sounds: Normal heart sounds.  Pulmonary:     Effort: Pulmonary effort is normal.     Breath sounds: Normal breath sounds.  Abdominal:     General: Bowel sounds are normal.     Palpations: Abdomen is soft.  Musculoskeletal:        General: No tenderness or deformity. Normal range of motion.     Cervical back: Normal range of motion.  Lymphadenopathy:     Cervical: No cervical adenopathy.  Skin:    General: Skin is warm and dry.     Findings: No erythema or rash.  Neurological:     Mental Status: He is alert and oriented to person, place, and time.  Psychiatric:        Behavior: Behavior normal.        Thought Content: Thought content normal.        Judgment: Judgment normal.      Lab Results  Component Value Date   WBC 2.1 (L) 09/07/2020   HGB 9.3 (L) 09/07/2020   HCT 26.1 (L)  09/07/2020   MCV 92.6 09/07/2020   PLT 39 (L) 09/07/2020   Lab Results  Component Value Date   FERRITIN 2,549 (H) 10/21/2019   IRON 43 10/21/2019   TIBC 225 10/21/2019   UIBC 182 10/21/2019   IRONPCTSAT 19 (L) 10/21/2019   Lab Results  Component Value Date   RETICCTPCT 1.5 08/07/2019   RBC 2.82 (L) 09/07/2020   No results  found for: KPAFRELGTCHN, LAMBDASER, KAPLAMBRATIO No results found for: IGGSERUM, IGA, IGMSERUM No results found for: Ronnald Ramp, A1GS, A2GS, Violet Baldy, MSPIKE, SPEI   Chemistry      Component Value Date/Time   NA 130 (L) 09/05/2020 2127   NA 142 01/30/2017 0804   K 4.1 09/05/2020 2127   K 4.1 01/30/2017 0804   CL 94 (L) 09/05/2020 2127   CL 105 01/25/2016 0926   CO2 24 09/05/2020 2127   CO2 29 01/30/2017 0804   BUN 16 09/05/2020 2127   BUN 6.9 (L) 01/30/2017 0804   CREATININE 1.56 (H) 09/05/2020 2127   CREATININE 1.42 (H) 08/24/2020 0827   CREATININE 0.8 01/30/2017 0804      Component Value Date/Time   CALCIUM 8.5 (L) 09/05/2020 2127   CALCIUM 10.5 (H) 01/30/2017 0804   ALKPHOS 76 08/24/2020 0827   ALKPHOS 79 01/30/2017 0804   AST 12 (L) 08/24/2020 0827   AST 34 01/30/2017 0804   ALT 18 08/24/2020 0827   ALT 18 01/30/2017 0804   BILITOT 0.6 08/24/2020 0827   BILITOT 1.49 (H) 01/30/2017 0804       Impression and Plan: Walter Hernandez is a very pleasant 70 yo caucasian gentleman with history of stage 1b melanoma of the left lower leg.   This really is not a problem right now.  He now has extensive stage small cell lung cancer.  Again, his blood counts are just too low to be treated.  I really think it would be a good idea to give him 2 weeks off so that he can at least enjoy Easter with his family.  Again he needs to have an MRI of his brain.  This will certainly show [place] what is going on with the brain mets.  He has had radiosurgery in the past for his brain mets.  His electrolytes all look okay.  He has been followed by  neurology.  Monitor when he goes back to see them for the seizure follow-up.  We will plan to get him back in 2 weeks.      Volanda Napoleon, MD 4/4/20228:54 AM

## 2020-09-07 NOTE — Progress Notes (Signed)
Per dr Marin Olp, no treatment today. Port flushed per protocol.

## 2020-09-07 NOTE — Patient Instructions (Signed)

## 2020-09-08 ENCOUNTER — Encounter: Payer: Self-pay | Admitting: *Deleted

## 2020-09-08 NOTE — Progress Notes (Signed)
Patient was seen yesterday. Treatment help due to patient counts, but also due to seizure activity over the weekend. Dr Marin Olp ordered a MRI to be done before the next appointment.  Walter Hernandez, from Radiation Oncology called to try to coordinate the MRI as they also require a follow up MRI, but with their imaging requirements. They were able to move MRI up to 09/21/2020. Dr Marin Olp notified.   Spoke with Walter Hernandez this morning. [name] is doing well. He is still sleepy but overall "okay". Walter Hernandez also is dealing with a broken wrist that may need surgery. She is very stressed with everything going on right now. She has her sister at the home to help at this time.   Oncology Nurse Navigator Documentation  Oncology Nurse Navigator Flowsheets 09/08/2020  Abnormal Finding Date -  Confirmed Diagnosis Date -  Diagnosis Status -  Planned Course of Treatment -  Phase of Treatment -  Chemotherapy Actual Start Date: -  Radiation Actual Start Date: -  Navigator Follow Up Date: 09/22/2020  Navigator Follow Up Reason: Follow-up Appointment  Navigator Location [facility]  Navigator Encounter Type Telephone;Appt/Treatment Plan Review  Telephone Outgoing Call;Appt Confirmation/Clarification  Treatment Initiated Date -  Patient Visit Type MedOnc  Treatment Phase Active Tx  Barriers/Navigation Needs Coordination of Care;Education  Education Other  Interventions Education;Psycho-Social Support;Coordination of Care  Acuity Level 2-Minimal Needs (1-2 Barriers Identified)  Referrals -  Coordination of Care Radiology  Education Method Verbal  Support Groups/Services Friends and Family  Time Spent with Patient 49

## 2020-09-14 ENCOUNTER — Other Ambulatory Visit: Payer: Medicare Other

## 2020-09-14 ENCOUNTER — Encounter: Payer: Self-pay | Admitting: Urology

## 2020-09-14 ENCOUNTER — Ambulatory Visit: Payer: Medicare Other

## 2020-09-14 ENCOUNTER — Other Ambulatory Visit: Payer: Self-pay

## 2020-09-14 ENCOUNTER — Telehealth: Payer: Self-pay

## 2020-09-14 ENCOUNTER — Ambulatory Visit: Payer: Medicare Other | Admitting: Hematology & Oncology

## 2020-09-14 NOTE — Telephone Encounter (Signed)
Spoke with patients wife in regards to telephone visit with Freeman Caldron PA on 09/25/20 @ 9:00am. Patient wife verbalized understanding of appointment date and time. Meaningful use questions were reviewed. TM

## 2020-09-21 ENCOUNTER — Ambulatory Visit: Payer: Medicare Other | Admitting: Hematology & Oncology

## 2020-09-21 ENCOUNTER — Other Ambulatory Visit: Payer: Self-pay

## 2020-09-21 ENCOUNTER — Other Ambulatory Visit: Payer: Medicare Other

## 2020-09-21 ENCOUNTER — Ambulatory Visit (HOSPITAL_COMMUNITY)
Admission: RE | Admit: 2020-09-21 | Discharge: 2020-09-21 | Disposition: A | Discharge status: Home / Self Care | Payer: Medicare Other | Source: Ambulatory Visit | Attending: Internal Medicine | Admitting: Internal Medicine

## 2020-09-21 ENCOUNTER — Ambulatory Visit: Payer: Medicare Other

## 2020-09-21 DIAGNOSIS — G9389 Other specified disorders of brain: Secondary | ICD-10-CM | POA: Diagnosis not present

## 2020-09-21 DIAGNOSIS — C7931 Secondary malignant neoplasm of brain: Secondary | ICD-10-CM | POA: Diagnosis not present

## 2020-09-21 IMAGING — MR MR HEAD WO/W CM
11 of 14 series · 21 of 48 positions shown · IV contrast (cc gad)
Comparison: [DATE]

CLINICAL DATA: SRS protocol. Melanoma metastatic disease. Cranial
XRT -- [DATE] thru [DATE]

EXAM:
MRI HEAD WITHOUT AND WITH CONTRAST
TECHNIQUE: Multiplanar, multiecho pulse sequences of the brain and surrounding
structures were obtained without and with intravenous contrast.
CONTRAST:  6mL GADAVIST GADOBUTROL 1 MMOL/ML IV SOLN

[Series 2: FLAIR · sagittal · 3.0mm · 0.51mm/px · 1 of 49 slices shown (1 of 2)]
[im 1/49]
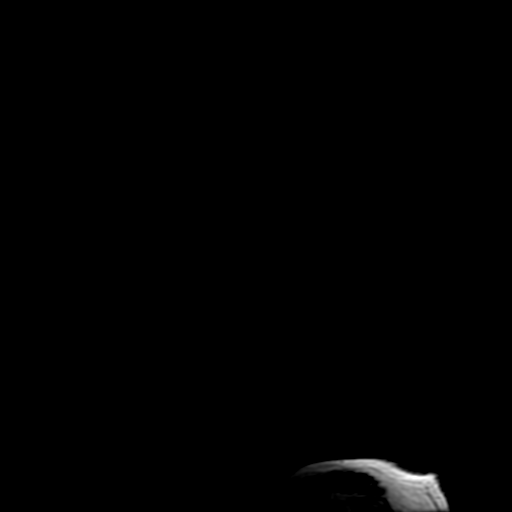

[Series 3: DWI · axial · 3.0mm · 0.94mm/px · z∈[-19,+158]mm · 2 of 124 slices shown]
[im 1/124]
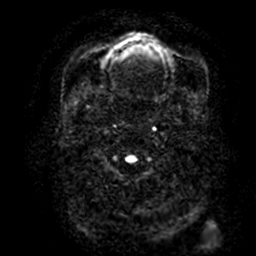
[im 124/124]
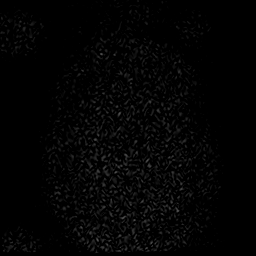

[Series 4: FLAIR · axial · 3.0mm · 0.51mm/px · z∈[-79,+119]mm · 2 of 67 slices shown (2 of 2)]
[im 1/67]
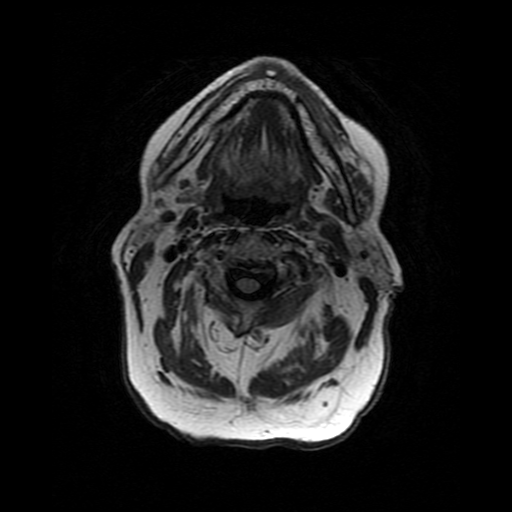
[im 67/67]
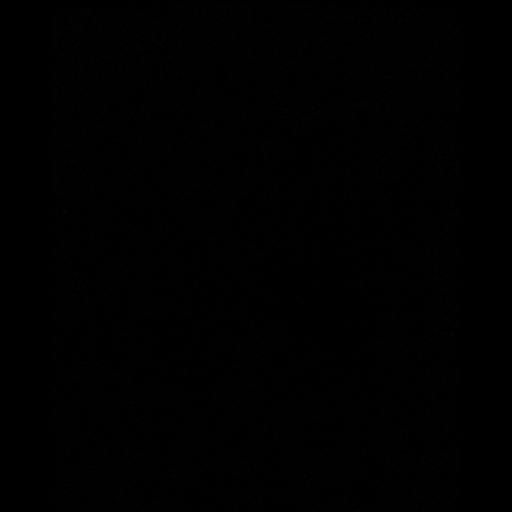

[Series 5: SWI · axial · 3.0mm · 0.51mm/px · z∈[-29,+144]mm · 3 of 118 slices shown (1 of 2)]
[im 1/118]
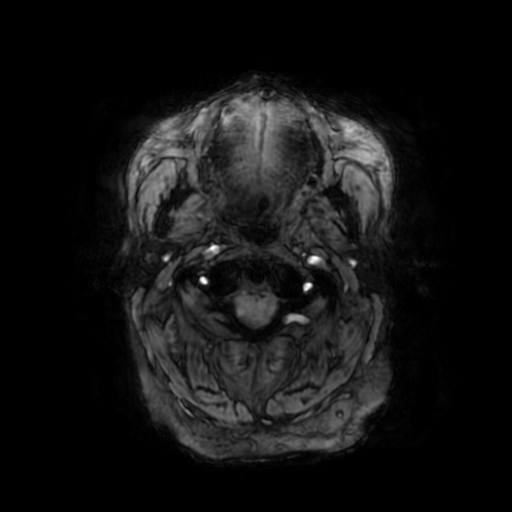
[im 59/118]
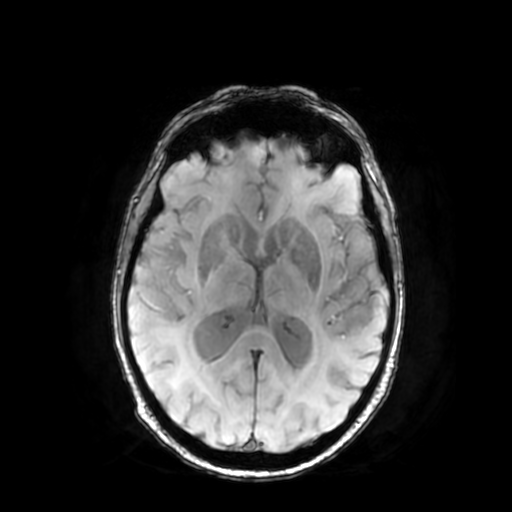
[im 118/118]
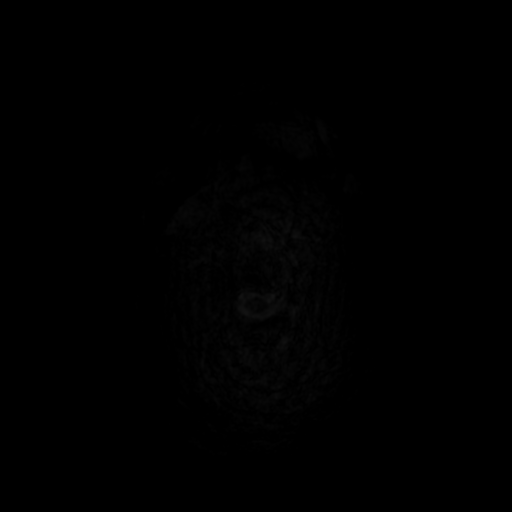

[Series 7: T2 post-contrast · coronal · 3.0mm · 0.43mm/px · 1 of 61 slices shown (1 of 2)]
[im 1/61]
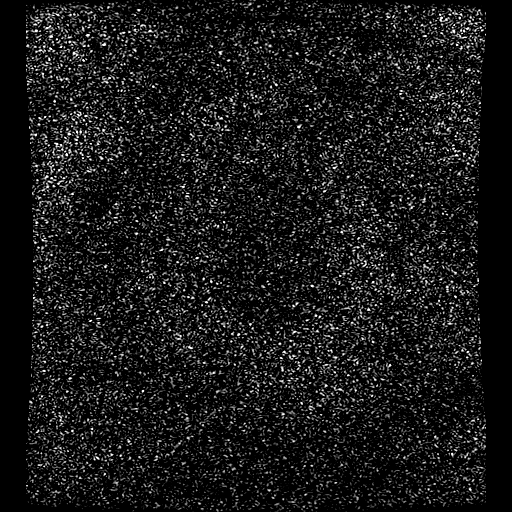

[Series 8: T2 post-contrast · axial · 5.0mm · 0.47mm/px · 1 of 30 slices shown (2 of 2)]
[im 1/30]
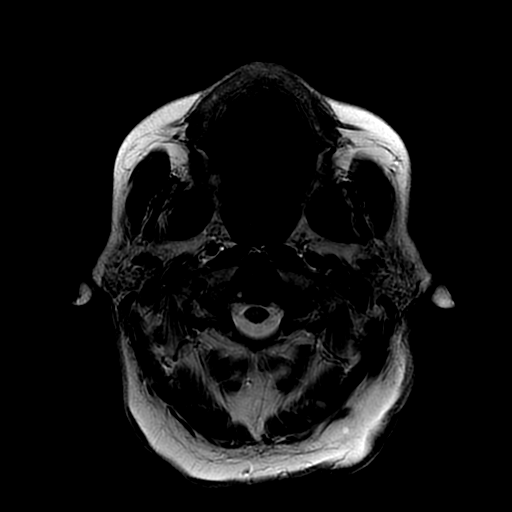

[Series 9: T1 post-contrast · coronal · 3.0mm · 0.43mm/px · 1 of 61 slices shown (1 of 2)]
[im 1/61]
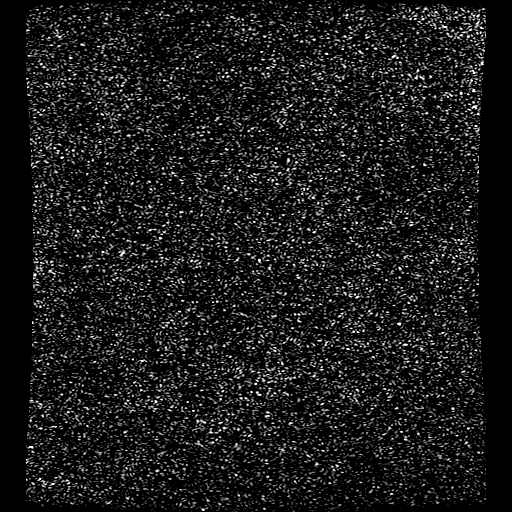

[Series 10: FLAIR post-contrast · sagittal · 3.0mm · 0.51mm/px · 1 of 49 slices shown]
[im 1/49]
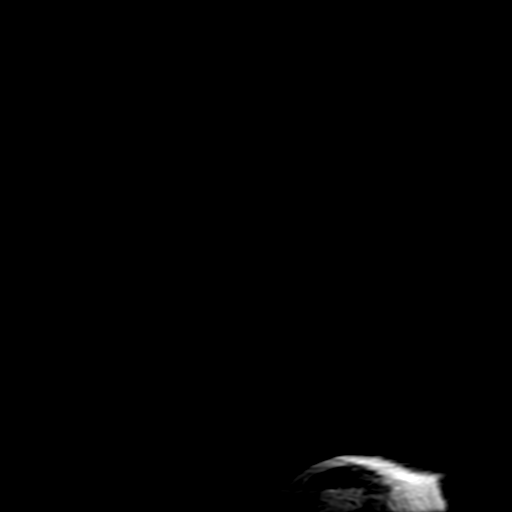

[Series 350: ADC · axial · 3.0mm · 0.94mm/px · 1 of 62 slices shown]
[im 1/62]
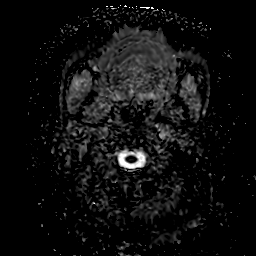

[Series 500: SWI · axial · 3.0mm · 0.51mm/px · 1 of 118 slices shown (2 of 2)]
[im 1/118]
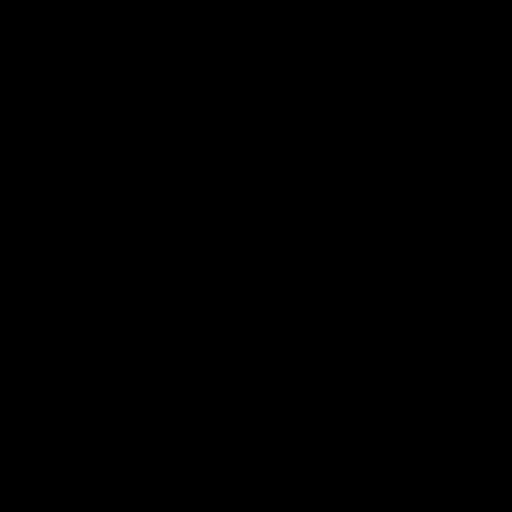

[Series 1100: T1 post-contrast · axial · 0.9mm · 0.50mm/px · z∈[-126,+129]mm · 7 of 301 slices shown (2 of 2)]
[im 1/301]
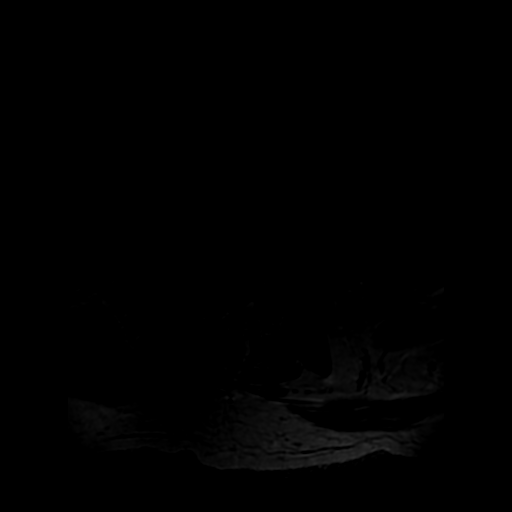
[im 51/301]
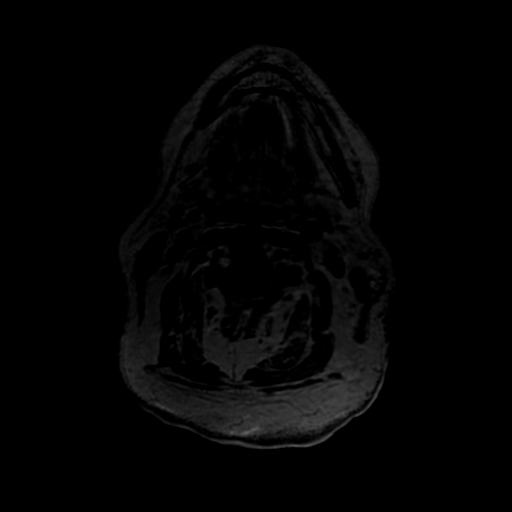
[im 101/301]
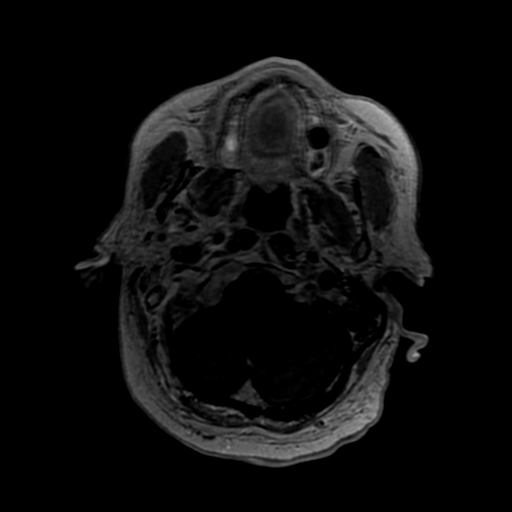
[im 151/301]
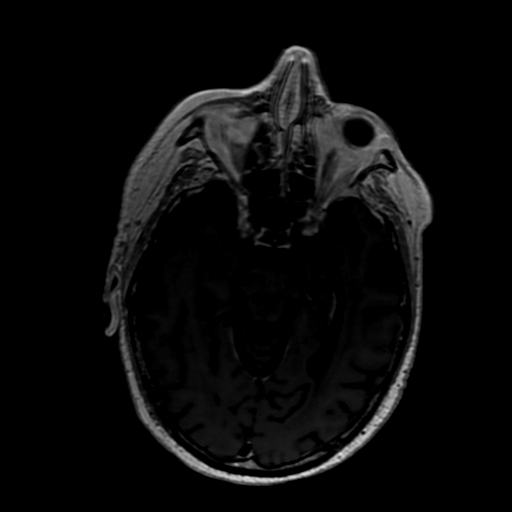
[im 201/301]
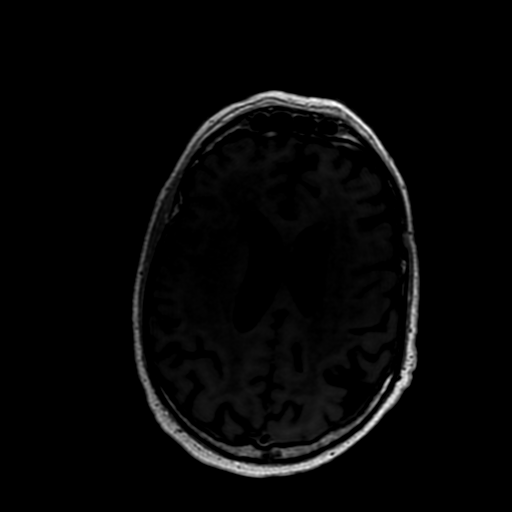
[im 251/301]
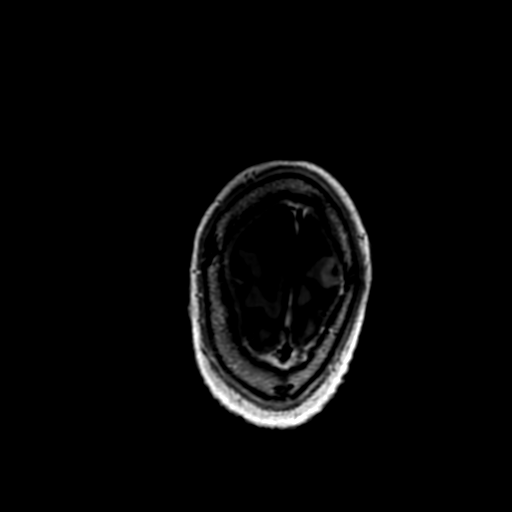
[im 301/301]
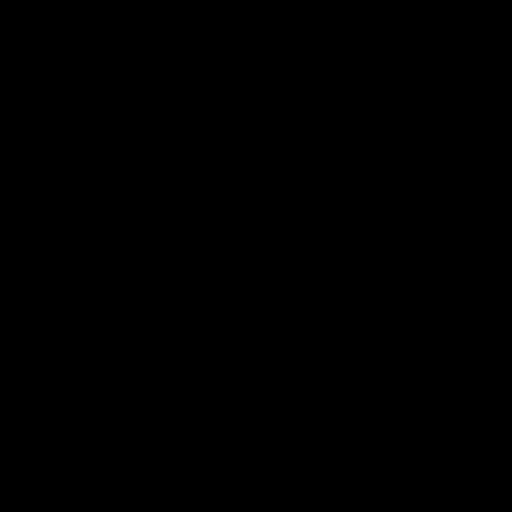

[21 of 48 positions shown; findings below may reference images not displayed]

FINDINGS: Brain: There are 2 lesions at of increased in size. The first is a
superior left frontal lesion that is dura form and measures
approximately 16 mm in greatest dimension (image 237). The second is
at the right insula and measures 7 mm (image 176). The other
previously demonstrated lesions no longer show contrast enhancement.
There is a questionable focus of contrast enhancement at the left
caudate tail (image 165).

Otherwise, there are no new lesions. No midline shift or other mass
effect. No acute hemorrhage or infarct. Confluent white matter
hyperintensity compatible with radiation treatment.

Vascular: Major flow voids are preserved.

Skull and upper cervical spine: Normal calvarium and skull base.
Visualized upper cervical spine and soft tissues are normal.

Sinuses/Orbits:No paranasal sinus fluid levels or advanced mucosal
thickening. Small amount of left mastoid fluid. Normal orbits.
IMPRESSION: 1. Mixed response with increased size of 2 metastatic lesions, but
resolution of the other previously seen lesions.
2. Questionable focus of contrast enhancement at the left caudate
tail, favored to be vascular. Otherwise, no new lesions.

## 2020-09-21 MED ORDER — GADOBUTROL 1 MMOL/ML IV SOLN
6.0000 mL | Freq: Once | INTRAVENOUS | Status: AC | PRN
  Administered 2020-09-21: 6 mL via INTRAVENOUS

## 2020-09-21 NOTE — Progress Notes (Signed)
Patient declines having PAC accessed and would prefer having and IV started.  MRI staff to start IV.

## 2020-09-22 ENCOUNTER — Encounter: Payer: Self-pay | Admitting: Radiation Oncology

## 2020-09-22 ENCOUNTER — Inpatient Hospital Stay: Payer: Medicare Other

## 2020-09-22 ENCOUNTER — Inpatient Hospital Stay (HOSPITAL_BASED_OUTPATIENT_CLINIC_OR_DEPARTMENT_OTHER): Payer: Medicare Other | Admitting: Hematology & Oncology

## 2020-09-22 ENCOUNTER — Encounter: Payer: Self-pay | Admitting: *Deleted

## 2020-09-22 ENCOUNTER — Encounter: Payer: Self-pay | Admitting: Hematology & Oncology

## 2020-09-22 DIAGNOSIS — C3431 Malignant neoplasm of lower lobe, right bronchus or lung: Secondary | ICD-10-CM | POA: Diagnosis not present

## 2020-09-22 DIAGNOSIS — C3490 Malignant neoplasm of unspecified part of unspecified bronchus or lung: Secondary | ICD-10-CM | POA: Diagnosis not present

## 2020-09-22 DIAGNOSIS — Z5111 Encounter for antineoplastic chemotherapy: Secondary | ICD-10-CM | POA: Diagnosis not present

## 2020-09-22 DIAGNOSIS — C4372 Malignant melanoma of left lower limb, including hip: Secondary | ICD-10-CM

## 2020-09-22 DIAGNOSIS — R569 Unspecified convulsions: Secondary | ICD-10-CM | POA: Diagnosis not present

## 2020-09-22 DIAGNOSIS — C7931 Secondary malignant neoplasm of brain: Secondary | ICD-10-CM | POA: Diagnosis not present

## 2020-09-22 DIAGNOSIS — C787 Secondary malignant neoplasm of liver and intrahepatic bile duct: Secondary | ICD-10-CM | POA: Diagnosis not present

## 2020-09-22 LAB — CBC WITH DIFFERENTIAL (CANCER CENTER ONLY)
Abs Immature Granulocytes: 0.07 10*3/uL (ref 0.00–0.07)
Basophils Absolute: 0 10*3/uL (ref 0.0–0.1)
Basophils Relative: 1 %
Eosinophils Absolute: 0 10*3/uL (ref 0.0–0.5)
Eosinophils Relative: 1 %
HCT: 24.9 % — ABNORMAL LOW (ref 39.0–52.0)
Hemoglobin: 8.8 g/dL — ABNORMAL LOW (ref 13.0–17.0)
Immature Granulocytes: 2 %
Lymphocytes Relative: 43 %
Lymphs Abs: 1.8 10*3/uL (ref 0.7–4.0)
MCH: 33.2 pg (ref 26.0–34.0)
MCHC: 35.3 g/dL (ref 30.0–36.0)
MCV: 94 fL (ref 80.0–100.0)
Monocytes Absolute: 0.6 10*3/uL (ref 0.1–1.0)
Monocytes Relative: 14 %
Neutro Abs: 1.6 10*3/uL — ABNORMAL LOW (ref 1.7–7.7)
Neutrophils Relative %: 39 %
Platelet Count: 88 10*3/uL — ABNORMAL LOW (ref 150–400)
RBC: 2.65 MIL/uL — ABNORMAL LOW (ref 4.22–5.81)
RDW: 17.4 % — ABNORMAL HIGH (ref 11.5–15.5)
WBC Count: 4.1 10*3/uL (ref 4.0–10.5)
nRBC: 0 % (ref 0.0–0.2)

## 2020-09-22 LAB — CMP (CANCER CENTER ONLY)
ALT: 8 U/L (ref 0–44)
AST: 13 U/L — ABNORMAL LOW (ref 15–41)
Albumin: 3.9 g/dL (ref 3.5–5.0)
Alkaline Phosphatase: 82 U/L (ref 38–126)
Anion gap: 10 (ref 5–15)
BUN: 14 mg/dL (ref 8–23)
CO2: 26 mmol/L (ref 22–32)
Calcium: 9.7 mg/dL (ref 8.9–10.3)
Chloride: 91 mmol/L — ABNORMAL LOW (ref 98–111)
Creatinine: 1.45 mg/dL — ABNORMAL HIGH (ref 0.61–1.24)
GFR, Estimated: 52 mL/min — ABNORMAL LOW (ref >60)
Glucose, Bld: 122 mg/dL — ABNORMAL HIGH (ref 70–99)
Potassium: 4.1 mmol/L (ref 3.5–5.1)
Sodium: 127 mmol/L — ABNORMAL LOW (ref 135–145)
Total Bilirubin: 0.4 mg/dL (ref 0.3–1.2)
Total Protein: 6.5 g/dL (ref 6.5–8.1)

## 2020-09-22 LAB — LACTATE DEHYDROGENASE: LDH: 142 U/L (ref 98–192)

## 2020-09-22 LAB — SAMPLE TO BLOOD BANK

## 2020-09-22 MED ORDER — POTASSIUM CHLORIDE IN NACL 20-0.9 MEQ/L-% IV SOLN
Freq: Once | INTRAVENOUS | Status: AC
Start: 2020-09-22 — End: 2020-09-22
  Filled 2020-09-22: qty 1000

## 2020-09-22 MED ORDER — PALONOSETRON HCL INJECTION 0.25 MG/5ML
0.2500 mg | Freq: Once | INTRAVENOUS | Status: AC
  Administered 2020-09-22: 0.25 mg via INTRAVENOUS

## 2020-09-22 MED ORDER — SODIUM CHLORIDE 0.9 % IV SOLN
40.0000 mg | Freq: Once | INTRAVENOUS | Status: AC
  Administered 2020-09-22: 40 mg via INTRAVENOUS
  Filled 2020-09-22: qty 4

## 2020-09-22 MED ORDER — PALONOSETRON HCL INJECTION 0.25 MG/5ML
INTRAVENOUS | Status: AC
  Filled 2020-09-22: qty 5

## 2020-09-22 MED ORDER — SODIUM CHLORIDE 0.9% FLUSH
10.0000 mL | INTRAVENOUS | Status: DC | PRN
  Administered 2020-09-22: 10 mL
  Filled 2020-09-22: qty 10

## 2020-09-22 MED ORDER — HEPARIN SOD (PORK) LOCK FLUSH 100 UNIT/ML IV SOLN
500.0000 [IU] | Freq: Once | INTRAVENOUS | Status: AC | PRN
  Administered 2020-09-22: 500 [IU]
  Filled 2020-09-22: qty 5

## 2020-09-22 MED ORDER — SODIUM CHLORIDE 0.9 % IV SOLN
150.0000 mg | Freq: Once | INTRAVENOUS | Status: AC
  Administered 2020-09-22: 150 mg via INTRAVENOUS
  Filled 2020-09-22: qty 150

## 2020-09-22 MED ORDER — DARBEPOETIN ALFA 300 MCG/0.6ML IJ SOSY
PREFILLED_SYRINGE | INTRAMUSCULAR | Status: AC
  Filled 2020-09-22: qty 1.2

## 2020-09-22 MED ORDER — SODIUM CHLORIDE 0.9 % IV SOLN
60.0000 mg/m2 | Freq: Once | INTRAVENOUS | Status: AC
  Administered 2020-09-22: 120 mg via INTRAVENOUS
  Filled 2020-09-22: qty 6

## 2020-09-22 MED ORDER — LEVETIRACETAM 500 MG PO TABS: 500.0000 mg | ORAL_TABLET | Freq: Two times a day (BID) | ORAL | 6 refills | Status: DC

## 2020-09-22 MED ORDER — EPOETIN ALFA-EPBX 40000 UNIT/ML IJ SOLN
INTRAMUSCULAR | Status: AC
  Filled 2020-09-22: qty 1

## 2020-09-22 MED ORDER — MAGNESIUM SULFATE 2 GM/50ML IV SOLN
2.0000 g | Freq: Once | INTRAVENOUS | Status: AC
  Administered 2020-09-22: 2 g via INTRAVENOUS
  Filled 2020-09-22: qty 50

## 2020-09-22 MED ORDER — SODIUM CHLORIDE 0.9 % IV SOLN
Freq: Once | INTRAVENOUS | Status: AC
Start: 2020-09-22 — End: 2020-09-22
  Filled 2020-09-22: qty 250

## 2020-09-22 MED ORDER — DEXAMETHASONE 4 MG PO TABS: 8.0000 mg | ORAL_TABLET | Freq: Two times a day (BID) | ORAL | 4 refills | Status: DC

## 2020-09-22 MED ORDER — SODIUM CHLORIDE 0.9 % IV SOLN
30.0000 mg/m2 | Freq: Once | INTRAVENOUS | Status: AC
  Administered 2020-09-22: 56 mg via INTRAVENOUS
  Filled 2020-09-22: qty 50

## 2020-09-22 MED ORDER — SODIUM CHLORIDE 0.9 % IV SOLN: 10.0000 mg | Freq: Once | INTRAVENOUS | Status: DC

## 2020-09-22 NOTE — Patient Instructions (Signed)
Implanted Port Insertion, Care After This sheet gives you information about how to care for yourself after your procedure. Your health care provider may also give you more specific instructions. If you have problems or questions, contact your health care provider. What can I expect after the procedure? After the procedure, it is common to have:  Discomfort at the port insertion site.  Bruising on the skin over the port. This should improve over 3-4 days. Follow these instructions at home: Port care  After your port is placed, you will get a manufacturer's information card. The card has information about your port. Keep this card with you at all times.  Take care of the port as told by your health care provider. Ask your health care provider if you or a family member can get training for taking care of the port at home. A home health care nurse may also take care of the port.  Make sure to remember what type of port you have. Incision care  Follow instructions from your health care provider about how to take care of your port insertion site. Make sure you: ? Wash your hands with soap and water before and after you change your bandage (dressing). If soap and water are not available, use hand sanitizer. ? Change your dressing as told by your health care provider. ? Leave stitches (sutures), skin glue, or adhesive strips in place. These skin closures may need to stay in place for 2 weeks or longer. If adhesive strip edges start to loosen and curl up, you may trim the loose edges. Do not remove adhesive strips completely unless your health care provider tells you to do that.  Check your port insertion site every day for signs of infection. Check for: ? Redness, swelling, or pain. ? Fluid or blood. ? Warmth. ? Pus or a bad smell.      Activity  Return to your normal activities as told by your health care provider. Ask your health care provider what activities are safe for you.  Do not  lift anything that is heavier than 10 lb (4.5 kg), or the limit that you are told, until your health care provider says that it is safe. General instructions  Take over-the-counter and prescription medicines only as told by your health care provider.  Do not take baths, swim, or use a hot tub until your health care provider approves. Ask your health care provider if you may take showers. You may only be allowed to take sponge baths.  Do not drive for 24 hours if you were given a sedative during your procedure.  Wear a medical alert bracelet in case of an emergency. This will tell any health care providers that you have a port.  Keep all follow-up visits as told by your health care provider. This is important. Contact a health care provider if:  You cannot flush your port with saline as directed, or you cannot draw blood from the port.  You have a fever or chills.  You have redness, swelling, or pain around your port insertion site.  You have fluid or blood coming from your port insertion site.  Your port insertion site feels warm to the touch.  You have pus or a bad smell coming from the port insertion site. Get help right away if:  You have chest pain or shortness of breath.  You have bleeding from your port that you cannot control. Summary  Take care of the port as told by your   health care provider. Keep the manufacturer's information card with you at all times.  Change your dressing as told by your health care provider.  Contact a health care provider if you have a fever or chills or if you have redness, swelling, or pain around your port insertion site.  Keep all follow-up visits as told by your health care provider. This information is not intended to replace advice given to you by your health care provider. Make sure you discuss any questions you have with your health care provider. Document Revised: 12/19/2017 Document Reviewed: 12/19/2017 Elsevier Patient Education   2021 [employer]  

## 2020-09-22 NOTE — Progress Notes (Signed)
Hematology and Oncology Follow Up Visit  Walter Hernandez [id number] 05-07-1951 70 y.o. 09/22/2020   Principle Diagnosis:  Stage IB (T2aN0M0) superficial spreading melanoma of the left lower leg  Small Cell Lung Cancer -- Extensive Stage -- lung/liver/brain mets  Past Therapy: Cranial XRT -- 06/19/2019 thru 07/02/2019  Current Therapy:        Carboplatin/VP-16/Tecentriq -- started on 07/07/2018, s/p cycle #4 Tecentriq - maintenance -- cycle#1 -- start on 10/01/2019 - d/c on 10/21/2019 Zepzelca/Keytruda -- q 3 wk --s/p cycle #6 - started on 10/28/2019 -- d/c on 03/15/2020 CDDP/Irinotecan -- s/p cycle #5 -- started on 03/23/2020   Interim History:  [name] is here today for follow-up.  He might be doing a little bit better than when he last saw him.  We last saw him, he had a seizure.  This was in the emergency room the day before we saw him.  We did do an MRI of his brain.  This showed that there were 2 lesions in the brain that were enlarging.  One was 16 mm and the other was 7 mm.  I suspect we will have to see if he can have radiosurgery on these.  I just worry that we are losing ground on him.  We will have to see what his PET scan shows.  I think this will be helpful for see how is the systemic disease is going.  He is lost a little bit of weight.  I talked to his wife on the phone.  She unfortunately fractured her wrist and needs to have surgery on this tomorrow.  He needs to be on Decadron.  I will call in some Decadron for him.  He will be on 8 mg p.o. twice daily.  He has had no problems with seizures since the one episode.  He is on Keppra.  He has had no problems with bowels or bladder.  There is no incontinence.  He has had no cough or shortness of breath.  He has had no bleeding.  There has been no leg swelling.  Currently, I would say his performance status is probably ECOG 2.     Medications:  Allergies as of 09/22/2020   No Known Allergies     Medication List        Accurate as of September 22, 2020  9:04 AM. If you have any questions, ask your nurse or doctor.        acetaminophen 500 MG tablet Commonly known as: TYLENOL Take 1,000 mg by mouth every 6 (six) hours as needed for moderate pain.   ALPRAZolam 0.5 MG tablet Commonly known as: XANAX Take 1 tablet (0.5 mg total) by mouth every 6 (six) hours as needed for anxiety.   dexamethasone 4 MG tablet Commonly known as: DECADRON Dexamethasone 4 mg TID x 2 days, BID x 2 days, then 2 mg (1/2 tab) BID x 7 days, then 2 mg (1/2 tab) QD x 7 days then stop.   diphenoxylate-atropine 2.5-0.025 MG tablet Commonly known as: LOMOTIL Take 2 tablets at onset of diarrhea. Take one tablet after each loose stool. Max 8 tablets per day.   dronabinol 5 MG capsule Commonly known as: MARINOL Take 1 capsule (5 mg total) by mouth 2 (two) times daily before a meal.   finasteride 5 MG tablet Commonly known as: PROSCAR TAKE 1 TABLET BY MOUTH EVERY DAY   furosemide 20 MG tablet Commonly known as: LASIX Take 2 tablets (40 mg total) by mouth daily.  levETIRAcetam 500 MG tablet Commonly known as: Keppra Take 1 tablet (500 mg total) by mouth 2 (two) times daily.   lidocaine-prilocaine cream Commonly known as: EMLA Apply to affected area once   loperamide 2 MG capsule Commonly known as: IMODIUM   LORazepam 0.5 MG tablet Commonly known as: Ativan Take 1 tablet (0.5 mg total) by mouth every 6 (six) hours as needed (Nausea or vomiting).   multivitamin capsule Take 1 capsule by mouth daily.   omeprazole 40 MG capsule Commonly known as: PRILOSEC Take 1 capsule (40 mg total) by mouth 2 (two) times daily.   ondansetron 4 MG tablet Commonly known as: ZOFRAN Take 1 tablet (4 mg total) by mouth every 6 (six) hours as needed for nausea.   ondansetron 8 MG tablet Commonly known as: Zofran Take 1 tablet (8 mg total) by mouth 2 (two) times daily as needed. Start on the third day after cisplatin  chemotherapy.   ondansetron 8 MG disintegrating tablet Commonly known as: ZOFRAN-ODT Take 8 mg by mouth.   OPCON-A OP Place 2 drops into both eyes as needed (for dry eyes).   prochlorperazine 10 MG tablet Commonly known as: COMPAZINE Take 1 tablet (10 mg total) by mouth every 6 (six) hours as needed (Nausea or vomiting).   tamsulosin 0.4 MG Caps capsule Commonly known as: FLOMAX Take 1 capsule (0.4 mg total) by mouth daily.       Allergies: No Known Allergies  Past Medical History, Surgical history, Social history, and Family History were reviewed and updated.  Review of Systems: Review of Systems  Constitutional: Positive for weight loss.  HENT: Negative.   Eyes: Negative.   Respiratory: Negative.   Cardiovascular: Negative.   Gastrointestinal: Negative.   Genitourinary: Negative.   Musculoskeletal: Negative.   Skin: Negative.   Neurological: Negative.   Endo/Heme/Allergies: Negative.   Psychiatric/Behavioral: Negative.      Physical Exam:  vitals were not taken for this visit.   Wt Readings from Last 3 Encounters:  09/07/20 151 lb (68.5 kg)  08/10/20 150 lb 0.6 oz (68.1 kg)  08/07/20 151 lb 12.8 oz (68.9 kg)    Physical Exam Vitals reviewed.  HENT:     Head: Normocephalic and atraumatic.  Eyes:     Pupils: Pupils are equal, round, and reactive to light.  Cardiovascular:     Rate and Rhythm: Normal rate and regular rhythm.     Heart sounds: Normal heart sounds.  Pulmonary:     Effort: Pulmonary effort is normal.     Breath sounds: Normal breath sounds.  Abdominal:     General: Bowel sounds are normal.     Palpations: Abdomen is soft.  Musculoskeletal:        General: No tenderness or deformity. Normal range of motion.     Cervical back: Normal range of motion.  Lymphadenopathy:     Cervical: No cervical adenopathy.  Skin:    General: Skin is warm and dry.     Findings: No erythema or rash.  Neurological:     Mental Status: He is alert and  oriented to person, place, and time.  Psychiatric:        Behavior: Behavior normal.        Thought Content: Thought content normal.        Judgment: Judgment normal.      Lab Results  Component Value Date   WBC 4.1 09/22/2020   HGB 8.8 (L) 09/22/2020   HCT 24.9 (L) 09/22/2020  MCV 94.0 09/22/2020   PLT 88 (L) 09/22/2020   Lab Results  Component Value Date   FERRITIN 2,549 (H) 10/21/2019   IRON 43 10/21/2019   TIBC 225 10/21/2019   UIBC 182 10/21/2019   IRONPCTSAT 19 (L) 10/21/2019   Lab Results  Component Value Date   RETICCTPCT 1.5 08/07/2019   RBC 2.65 (L) 09/22/2020   No results found for: KPAFRELGTCHN, LAMBDASER, KAPLAMBRATIO No results found for: IGGSERUM, IGA, IGMSERUM No results found for: Kathrynn Ducking, MSPIKE, SPEI   Chemistry      Component Value Date/Time   NA 130 (L) 09/07/2020 0827   NA 142 01/30/2017 0804   K 3.8 09/07/2020 0827   K 4.1 01/30/2017 0804   CL 96 (L) 09/07/2020 0827   CL 105 01/25/2016 0926   CO2 24 09/07/2020 0827   CO2 29 01/30/2017 0804   BUN 12 09/07/2020 0827   BUN 6.9 (L) 01/30/2017 0804   CREATININE 1.40 (H) 09/07/2020 0827   CREATININE 0.8 01/30/2017 0804      Component Value Date/Time   CALCIUM 9.4 09/07/2020 0827   CALCIUM 10.5 (H) 01/30/2017 0804   ALKPHOS 79 09/07/2020 0827   ALKPHOS 79 01/30/2017 0804   AST 13 (L) 09/07/2020 0827   AST 34 01/30/2017 0804   ALT 10 09/07/2020 0827   ALT 18 01/30/2017 0804   BILITOT 0.4 09/07/2020 0827   BILITOT 1.49 (H) 01/30/2017 0804       Impression and Plan: Mr. Creasy is a very pleasant 70 yo caucasian gentleman with history of stage 1b melanoma of the left lower leg.   This really is not a problem right now.  He now has extensive stage small cell lung cancer.  I worry that he will continue to have CNS metastasis.  At some point, these will not be amenable to therapy.  We will have to see what his systemic disease is all about.   I suppose it would not surprise me if he has systemic progression.  I think that if he does, there is very little that we can do for this and I would consider him for Hospice.  Right now, we will have to speak with Radiation Oncology.  We will see if they can see him and see about the radiosurgery.  I am going to omit day 8 of his protocol from here on out.  I just do not think that is bone marrow can handle a second chemotherapy treatment per protocol.  I does think that we have to maintain his quality life is much as we can.  I think his blood counts being little bit better helps with this.  I will plan for a PET scan in about 3 weeks.  We will see him back in 4 weeks, or sooner if he has any problems.     Volanda Napoleon, MD 4/19/20229:04 AM

## 2020-09-22 NOTE — Progress Notes (Signed)
Oncology Nurse Navigator Documentation  Oncology Nurse Navigator Flowsheets 09/22/2020  Abnormal Finding Date -  Confirmed Diagnosis Date -  Diagnosis Status -  Planned Course of Treatment -  Phase of Treatment -  Chemotherapy Actual Start Date: -  Radiation Actual Start Date: -  Navigator Follow Up Date: 10/21/2020  Navigator Follow Up Reason: Follow-up Appointment  Navigator Location [facility]  Navigator Encounter Type Appt/Treatment Plan Review  Telephone -  Treatment Initiated Date -  Patient Visit Type MedOnc  Treatment Phase Active Tx  Barriers/Navigation Needs Coordination of Care;Education  Education -  Interventions None Required  Acuity Level 2-Minimal Needs (1-2 Barriers Identified)  Referrals -  Coordination of Care -  Education Method -  Support Groups/Services Friends and Family  Time Spent with Patient 15

## 2020-09-22 NOTE — Progress Notes (Signed)
Per Dr. Marin Olp, okay to treat today with platelets of 88,000

## 2020-09-22 NOTE — Patient Instructions (Signed)

## 2020-09-22 NOTE — Progress Notes (Signed)
Pt. Seems to be confused today.

## 2020-09-22 NOTE — Progress Notes (Signed)
  Radiation Oncology         [phone] ________________________________  Name: Walter Hernandez MRN: [id number]  Date: 09/22/2020  DOB: Sep 28, 1950  Chart Note:  I received a call from Dr. Marin Olp about this patient.  MRI brain yesterday shows two enlarging untreated brain metastases.    Radiographic Findings: MR BRAIN W WO CONTRAST  Result Date: 09/22/2020 CLINICAL DATA:  SRS protocol. Melanoma metastatic disease. Cranial XRT -- 06/19/2019 thru 07/02/2019 EXAM: MRI HEAD WITHOUT AND WITH CONTRAST TECHNIQUE: Multiplanar, multiecho pulse sequences of the brain and surrounding structures were obtained without and with intravenous contrast. CONTRAST:  34mL GADAVIST GADOBUTROL 1 MMOL/ML IV SOLN COMPARISON:  07/10/2020 FINDINGS: Brain: There are 2 lesions at of increased in size. The first is a superior left frontal lesion that is dura form and measures approximately 16 mm in greatest dimension (image 237). The second is at the right insula and measures 7 mm (image 176). The other previously demonstrated lesions no longer show contrast enhancement. There is a questionable focus of contrast enhancement at the left caudate tail (image 165). Otherwise, there are no new lesions. No midline shift or other mass effect. No acute hemorrhage or infarct. Confluent white matter hyperintensity compatible with radiation treatment. Vascular: Major flow voids are preserved. Skull and upper cervical spine: Normal calvarium and skull base. Visualized upper cervical spine and soft tissues are normal. Sinuses/Orbits:No paranasal sinus fluid levels or advanced mucosal thickening. Small amount of left mastoid fluid. Normal orbits. IMPRESSION: 1. Mixed response with increased size of 2 metastatic lesions, but resolution of the other previously seen lesions. 2. Questionable focus of contrast enhancement at the left caudate tail, favored to be vascular. Otherwise, no new lesions. Electronically Signed   By: Ulyses Jarred M.D.   On:  09/22/2020 01:04    Impression:  In light of this information, the patient may benefit from salvage SRS.  Plan:  At this point, we will work to coordinate evaluation in rad-onc.  ________________________________  Sheral Apley. Tammi Klippel, M.D.

## 2020-09-23 NOTE — Progress Notes (Signed)
Has armband been applied?  Yes.    Does patient have an allergy to IV contrast dye?: No.   Has patient ever received premedication for IV contrast dye?: No.   Does patient take metformin?: No.  If patient does take metformin when was the last dose: n/a  Date of lab work: 09/22/2020 BUN: 14 CR: 1.45 GFR 52 Standard dose  IV site: antecubital right, condition patent  Has IV site been added to flowsheet?  Yes.

## 2020-09-24 ENCOUNTER — Encounter: Payer: Self-pay | Admitting: *Deleted

## 2020-09-24 NOTE — Progress Notes (Signed)
Patient needs to have a PET scan scheduled.   Scheduled PET scan. Called and spoke to patient's wife and reviewed appointment date, time and location. Also instructed her on NPO status, and low carb meal the night before.   Oncology Nurse Navigator Documentation  Oncology Nurse Navigator Flowsheets 09/24/2020  Abnormal Finding Date -  Confirmed Diagnosis Date -  Diagnosis Status -  Planned Course of Treatment -  Phase of Treatment -  Chemotherapy Actual Start Date: -  Radiation Actual Start Date: -  Navigator Follow Up Date: 10/19/2020  Navigator Follow Up Reason: Scan Review  Navigator Location [facility]  Navigator Encounter Type Appt/Treatment Plan Review;Telephone  Telephone Appt Confirmation/Clarification;Outgoing Call  Treatment Initiated Date -  Patient Visit Type MedOnc  Treatment Phase Active Tx  Barriers/Navigation Needs Coordination of Care;Education  Education Other  Interventions Coordination of Care;Education;Psycho-Social Support  Acuity Level 2-Minimal Needs (1-2 Barriers Identified)  Referrals -  Coordination of Care Appts;Radiology  Education Method Verbal  Support Groups/Services Friends and Family  Time Spent with Patient 30

## 2020-09-25 ENCOUNTER — Ambulatory Visit
Admission: RE | Admit: 2020-09-25 | Discharge: 2020-09-25 | Disposition: A | Discharge status: Home / Self Care | Payer: Medicare Other | Source: Ambulatory Visit | Attending: Urology | Admitting: Urology

## 2020-09-25 ENCOUNTER — Ambulatory Visit
Admission: RE | Admit: 2020-09-25 | Discharge: 2020-09-25 | Disposition: A | Discharge status: Home / Self Care | Payer: Medicare Other | Source: Ambulatory Visit | Attending: Radiation Oncology | Admitting: Radiation Oncology

## 2020-09-25 ENCOUNTER — Other Ambulatory Visit: Payer: Self-pay

## 2020-09-25 VITALS — BP 117/86 | HR 108 | Temp 97.6°F | Resp 20 | Ht 67.0 in | Wt 151.4 lb

## 2020-09-25 DIAGNOSIS — C7931 Secondary malignant neoplasm of brain: Secondary | ICD-10-CM

## 2020-09-25 DIAGNOSIS — C3490 Malignant neoplasm of unspecified part of unspecified bronchus or lung: Secondary | ICD-10-CM | POA: Diagnosis not present

## 2020-09-25 MED ORDER — SODIUM CHLORIDE 0.9% FLUSH
10.0000 mL | Freq: Once | INTRAVENOUS | Status: AC
  Administered 2020-09-25: 10 mL via INTRAVENOUS

## 2020-09-28 ENCOUNTER — Inpatient Hospital Stay: Payer: Medicare Other

## 2020-09-28 ENCOUNTER — Other Ambulatory Visit: Payer: Medicare Other

## 2020-09-28 ENCOUNTER — Ambulatory Visit: Payer: Medicare Other

## 2020-09-29 DIAGNOSIS — C3490 Malignant neoplasm of unspecified part of unspecified bronchus or lung: Secondary | ICD-10-CM | POA: Diagnosis not present

## 2020-09-29 DIAGNOSIS — C7931 Secondary malignant neoplasm of brain: Secondary | ICD-10-CM | POA: Diagnosis not present

## 2020-09-29 NOTE — Progress Notes (Signed)
  Radiation Oncology         [phone] ________________________________  Name: Walter Hernandez MRN: [id number]  Date: 09/25/2020  DOB: [birth year]  SIMULATION AND TREATMENT PLANNING NOTE    ICD-10-CM   1. Brain metastases ([place])  C79.31     DIAGNOSIS:  70 yo man with two brain metastases from small cell lung cancer s/p previous whole brain radiotherapy.     NARRATIVE:  The patient was brought to the [facility].  Identity was confirmed.  All relevant records and images related to the planned course of therapy were reviewed.  The patient freely provided informed written consent to proceed with treatment after reviewing the details related to the planned course of therapy. The consent form was witnessed and verified by the simulation staff. Intravenous access was established for contrast administration. Then, the patient was set-up in a stable reproducible supine position for radiation therapy.  A relocatable thermoplastic stereotactic head frame was fabricated for precise immobilization.  CT images were obtained.  Surface markings were placed.  The CT images were loaded into the planning software and fused with the patient's targeting MRI scan.  Then the target and avoidance structures were contoured.  Treatment planning then occurred.  The radiation prescription was entered and confirmed.  I have requested 3D planning  I have requested a DVH of the following structures: Brain stem, brain, left eye, right eye, lenses, optic chiasm, target volumes, uninvolved brain, and normal tissue.    SPECIAL TREATMENT PROCEDURE:  The planned course of therapy using radiation constitutes a special treatment procedure. Special care is required in the management of this patient for the following reasons. This treatment constitutes a Special Treatment Procedure for the following reason: High dose per fraction requiring special monitoring for increased toxicities of treatment including daily imaging.  The  special nature of the planned course of radiotherapy will require increased physician supervision and oversight to ensure patient's safety with optimal treatment outcomes.  PLAN:  The patient will receive 20 Gy in 1 fraction.  ________________________________  Sheral Apley Tammi Klippel, M.D.

## 2020-10-05 ENCOUNTER — Encounter: Payer: Self-pay | Admitting: Urology

## 2020-10-05 ENCOUNTER — Ambulatory Visit
Admission: RE | Admit: 2020-10-05 | Discharge: 2020-10-05 | Disposition: A | Discharge status: Home / Self Care | Payer: Medicare Other | Source: Ambulatory Visit | Attending: Urology | Admitting: Urology

## 2020-10-05 ENCOUNTER — Other Ambulatory Visit: Payer: Self-pay

## 2020-10-05 VITALS — BP 130/75 | HR 71 | Temp 96.7°F | Resp 18

## 2020-10-05 DIAGNOSIS — C7931 Secondary malignant neoplasm of brain: Secondary | ICD-10-CM | POA: Diagnosis not present

## 2020-10-05 DIAGNOSIS — C3490 Malignant neoplasm of unspecified part of unspecified bronchus or lung: Secondary | ICD-10-CM | POA: Diagnosis not present

## 2020-10-05 NOTE — Op Note (Signed)
  Name: Walter Hernandez  MRN: [id number]  Date: 10/05/2020   DOB: [birth year]  Stereotactic Radiosurgery Operative Note  PRE-OPERATIVE DIAGNOSIS:  Multiple Brain Metastases  POST-OPERATIVE DIAGNOSIS:  Multiple Brain Metastases  PROCEDURE:  Stereotactic Radiosurgery  SURGEON:  Peggyann Shoals, MD  NARRATIVE: The patient underwent a radiation treatment planning session in the radiation oncology simulation suite under the care of the radiation oncology physician and physicist.  I participated closely in the radiation treatment planning afterwards. The patient underwent planning CT which was fused to 3T high resolution MRI with 1 mm axial slices.  These images were fused on the planning system.  We contoured the gross target volumes and subsequently expanded this to yield the Planning Target Volume. I actively participated in the planning process.  I helped to define and review the target contours and also the contours of the optic pathway, eyes, brainstem and selected nearby organs at risk.  All the dose constraints for critical structures were reviewed and compared to AAPM Task Group 101.  The prescription dose conformity was reviewed.  I approved the plan electronically.    Accordingly, Walter Hernandez was brought to the TrueBeam stereotactic radiation treatment linac and placed in the custom immobilization mask.  The patient was aligned according to the IR fiducial markers with BrainLab Exactrac, then orthogonal x-rays were used in ExacTrac with the 6DOF robotic table and the shifts were made to align the patient  Walter Hernandez received stereotactic radiosurgery uneventfully.    Lesions treated:  2   Complex lesions treated:  0 (>3.5 cm, <30mm of optic path, or within the brainstem)   The detailed description of the procedure is recorded in the radiation oncology procedure note.  I was present for the duration of the procedure.  DISPOSITION:  Following delivery, the patient was transported to nursing in  stable condition and monitored for possible acute effects to be discharged to home in stable condition with follow-up in one month.  Peggyann Shoals, MD 10/05/2020 3:35 PM

## 2020-10-06 ENCOUNTER — Telehealth: Payer: Self-pay | Admitting: Radiation Oncology

## 2020-10-06 NOTE — Progress Notes (Signed)
Nurse monitoring following SRS treatment complete. Patient reports feeling well other than fatigue. Patient anxious to return home, get in his recliner, find a comfortable spot, and rest. Patient denies development of new neurologic symptoms. Patient and wife both deny any needs at this time. Instructed patient to avoid strenuous activity for the next 24 hours. Instructed both to phone [phone] for any needs after hours as Tammi Klippel is on call. Both verbalized understanding of all reviewed. Patient discharged home with his wife, Kennyth Lose. Darlene, [place] wheeled patient to the lobby. No distress noted at discharge.  BP 130/75   Pulse 71   Temp (!) 96.7 F (35.9 C)   Resp 18

## 2020-10-06 NOTE — Progress Notes (Signed)
  Radiation Oncology         [phone] ________________________________  Stereotactic Treatment Procedure Note  Name: Idris Edmundson MRN: [id number]  Date: 10/05/2020  DOB: [birth year]  SPECIAL TREATMENT PROCEDURE    ICD-10-CM   1. Brain metastases ([place])  C79.31     3D TREATMENT PLANNING AND DOSIMETRY:  The patient's radiation plan was reviewed and approved by neurosurgery and radiation oncology prior to treatment.  It showed 3-dimensional radiation distributions overlaid onto the planning CT/MRI image set.  [facility] for the target structures as well as the organs at risk were reviewed. The documentation of the 3D plan and dosimetry are filed in the radiation oncology EMR.  NARRATIVE:  Caelen Reierson was brought to the TrueBeam stereotactic radiation treatment machine and placed supine on the CT couch. The head frame was applied, and the patient was set up for stereotactic radiosurgery.  Neurosurgery was present for the set-up and delivery  SIMULATION VERIFICATION:  In the couch zero-angle position, the patient underwent Exactrac imaging using the Brainlab system with orthogonal KV images.  These were carefully aligned and repeated to confirm treatment position for each of the isocenters.  The Exactrac snap film verification was repeated at each couch angle.  PROCEDURE: [name] received stereotactic radiosurgery to the following targets: The left 16 mm frontal and right 7 mm insular target were treated using 4 Rapid Arc VMAT Beams to a prescription dose of 20 Gy.  ExacTrac registration was performed for each couch angle.  The 100% isodose line was prescribed.  6 MV X-rays were delivered in the flattening filter free beam mode.  STEREOTACTIC TREATMENT MANAGEMENT:  Following delivery, the patient was transported to nursing in stable condition and monitored for possible acute effects.  Vital signs were recorded BP 130/75   Pulse 71   Temp (!) 96.7 F (35.9 C)   Resp 18 . The patient tolerated  treatment without significant acute effects, and was discharged to home in stable condition.    PLAN: Follow-up in one month.  ________________________________  Sheral Apley. Tammi Klippel, M.D.

## 2020-10-06 NOTE — Telephone Encounter (Signed)
Phoned patient's home. Spoke with wife, Colletta Maryland. She reports her husband has been doing well since his [place] treatment yesterday. Per Ashlyn Bruning's direction I instructed Colletta Maryland to begin tapering her husband off decadron tomorrow. Verbalized the following instructions as Colletta Maryland wrote them down:  4mg  po BID x7 days, then 2mg  po BID x7 days, then 2mg  po qd x 7 days and then stop  [place] read the directions back correctly. Encouraged Colletta Maryland to call Manuela Schwartz or myself if any of her husband's neurologic symptoms began to resurface. Encouraged her to watch his mouth for thrush and phone either Manuela Schwartz or myself should this present. Encouraged her to administer doses earlier in the day allowing greater potential for him to get a good nights rest. Colletta Maryland verbalized understanding of all reviewed.

## 2020-10-06 NOTE — Telephone Encounter (Signed)
-----   Message from Freeman Caldron, [place] sent at 10/06/2020  1:22 PM EDT ----- Regarding: RE: steroid taper instructions You are correct. Ennever did start these but it would be ok to begin tapering.  Please let patient and wife know that he can start taking 4mg  po BID x7 days, then 2mg  po BID x7 days, then 2mg  po qd x 7 days and then stop. Thank you!!!! -Lia Foyer ----- Message ----- From: Heywood Footman, RN Sent: 10/06/2020  11:25 AM EDT To: Tyler Pita, MD, Freeman Caldron, PA-C, # Subject: RE: steroid taper instructions                 I believe Ennever started these!?! ----- Message ----- From: Pincus Large Sent: 10/06/2020  10:07 AM EDT To: Tyler Pita, MD, Freeman Caldron, PA-C, # Subject: steroid taper instructions                     Hello team, Garth Schlatter has been taking steroids, 8mg  BID, since 4/19. He completed his [facility] treatment yesterday, and tolerated that very well. Would you like to start a taper?    Manuela Schwartz

## 2020-10-15 ENCOUNTER — Other Ambulatory Visit (HOSPITAL_COMMUNITY): Payer: Medicare Other

## 2020-10-16 ENCOUNTER — Ambulatory Visit (HOSPITAL_COMMUNITY): Payer: Medicare Other

## 2020-10-19 ENCOUNTER — Other Ambulatory Visit: Payer: Self-pay

## 2020-10-19 ENCOUNTER — Ambulatory Visit (HOSPITAL_COMMUNITY)
Admission: RE | Admit: 2020-10-19 | Discharge: 2020-10-19 | Disposition: A | Discharge status: Home / Self Care | Payer: Medicare Other | Source: Ambulatory Visit | Attending: Hematology & Oncology | Admitting: Hematology & Oncology

## 2020-10-19 DIAGNOSIS — C7931 Secondary malignant neoplasm of brain: Secondary | ICD-10-CM | POA: Diagnosis not present

## 2020-10-19 DIAGNOSIS — C787 Secondary malignant neoplasm of liver and intrahepatic bile duct: Secondary | ICD-10-CM | POA: Diagnosis not present

## 2020-10-19 DIAGNOSIS — I700 Atherosclerosis of aorta: Secondary | ICD-10-CM | POA: Diagnosis not present

## 2020-10-19 DIAGNOSIS — C3490 Malignant neoplasm of unspecified part of unspecified bronchus or lung: Secondary | ICD-10-CM | POA: Diagnosis not present

## 2020-10-19 DIAGNOSIS — C779 Secondary and unspecified malignant neoplasm of lymph node, unspecified: Secondary | ICD-10-CM | POA: Insufficient documentation

## 2020-10-19 LAB — GLUCOSE, CAPILLARY: Glucose-Capillary: 77 mg/dL (ref 70–99)

## 2020-10-19 IMAGING — CT NM PET TUM IMG RESTAG (PS) SKULL BASE T - THIGH
1 of 7 series · 2 of 25 positions shown · non-contrast
Comparison: [DATE]

CLINICAL DATA: Subsequent treatment strategy for small-cell lung
cancer.

EXAM:
NUCLEAR MEDICINE PET SKULL BASE TO THIGH
TECHNIQUE: 7.5 mCi F-18 FDG was injected intravenously. Full-ring PET imaging
was performed from the skull base to thigh after the radiotracer. CT
data was obtained and used for attenuation correction and anatomic
localization.
Fasting blood glucose: 77 mg/dl

[Series 4: ct sk_thigh 5.0 bf37 · axial · 5.0mm · 0.98mm/px · z∈[-724,-532]mm · 2 of 240 slices shown]
[im 192/240  brain]
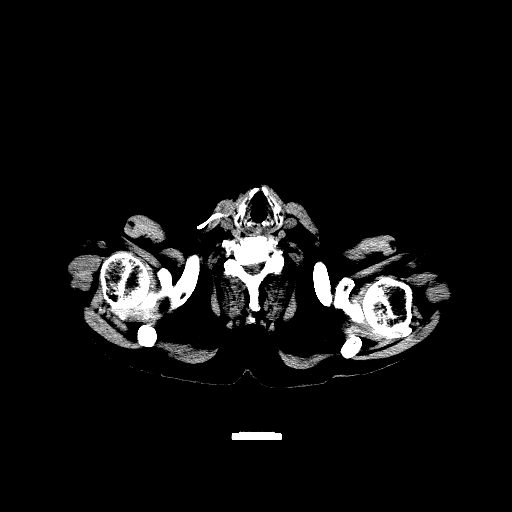
[im 240/240  brain]
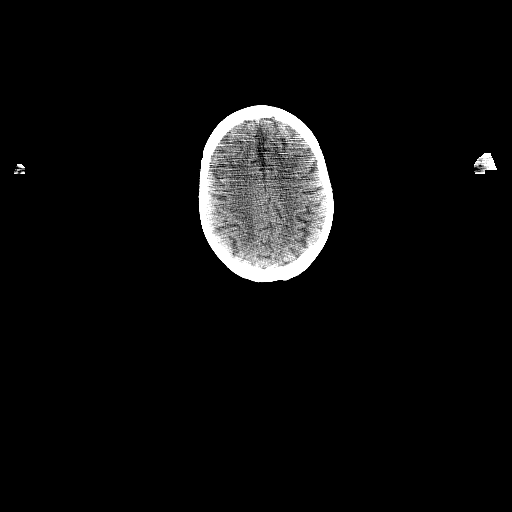

[2 of 25 positions shown; findings below may reference images not displayed]

FINDINGS: Mediastinal blood pool activity: SUV max

Liver activity: SUV max NA

NECK: No hypermetabolic lymph nodes in the neck.

Incidental CT findings: none

CHEST: No hypermetabolic mediastinal or hilar nodes. No suspicious
pulmonary nodules on the CT scan.

Incidental CT findings: Right Port-A-Cath tip is positioned in the
distal SVC. Ascending thoracic aorta measures 4.1 cm diameter.
Coronary artery calcification is evident. Atherosclerotic
calcification is noted in the wall of the thoracic aorta.

ABDOMEN/PELVIS: No abnormal hypermetabolic activity within the
liver, pancreas, adrenal glands, or spleen. No hypermetabolic lymph
nodes in the abdomen or pelvis.

Incidental CT findings: There is abdominal aortic atherosclerosis
without aneurysm. Mild circumferential bladder wall thickening noted
with tiny bladder diverticuli evident. Prostate gland appears
enlarged.

SKELETON: No focal hypermetabolic activity to suggest skeletal
metastasis. Degenerative uptake again noted in the left humeral
head.

Incidental CT findings: none
IMPRESSION: 1. Stable exam. No new suspicious hypermetabolic activity to suggest
recurrent or metastatic disease.
2. 4.1 cm diameter ascending thoracic aorta. Attention on
surveillance imaging recommended.
3.  Aortic Atherosclerois ([JK]-170.0)

## 2020-10-19 MED ORDER — FLUDEOXYGLUCOSE F - 18 (FDG) INJECTION
7.5000 | Freq: Once | INTRAVENOUS | Status: AC | PRN
  Administered 2020-10-19: 7.5 via INTRAVENOUS

## 2020-10-20 ENCOUNTER — Encounter: Payer: Self-pay | Admitting: *Deleted

## 2020-10-20 ENCOUNTER — Ambulatory Visit: Payer: Medicare Other | Admitting: Internal Medicine

## 2020-10-20 NOTE — Progress Notes (Signed)
Oncology Nurse Navigator Documentation  Oncology Nurse Navigator Flowsheets 10/20/2020  Abnormal Finding Date -  Confirmed Diagnosis Date -  Diagnosis Status -  Planned Course of Treatment -  Phase of Treatment -  Chemotherapy Actual Start Date: -  Radiation Actual Start Date: -  Navigator Follow Up Date: 10/21/2020  Navigator Follow Up Reason: Follow-up Appointment  Navigator Location [facility]  Navigator Encounter Type Scan Review  Telephone -  Treatment Initiated Date -  Patient Visit Type MedOnc  Treatment Phase Active Tx  Barriers/Navigation Needs Coordination of Care;Education  Education -  Interventions None Required  Acuity Level 2-Minimal Needs (1-2 Barriers Identified)  Referrals -  Coordination of Care -  Education Method -  Support Groups/Services Friends and Family  Time Spent with Patient 15

## 2020-10-21 ENCOUNTER — Inpatient Hospital Stay: Payer: Medicare Other

## 2020-10-21 ENCOUNTER — Other Ambulatory Visit: Payer: Self-pay

## 2020-10-21 ENCOUNTER — Inpatient Hospital Stay (HOSPITAL_BASED_OUTPATIENT_CLINIC_OR_DEPARTMENT_OTHER): Payer: Medicare Other | Admitting: Hematology & Oncology

## 2020-10-21 ENCOUNTER — Telehealth: Payer: Self-pay

## 2020-10-21 ENCOUNTER — Other Ambulatory Visit: Payer: Self-pay | Admitting: Gastroenterology

## 2020-10-21 ENCOUNTER — Encounter: Payer: Self-pay | Admitting: Hematology & Oncology

## 2020-10-21 ENCOUNTER — Inpatient Hospital Stay: Payer: Medicare Other | Attending: Hematology & Oncology

## 2020-10-21 ENCOUNTER — Encounter: Payer: Self-pay | Admitting: *Deleted

## 2020-10-21 ENCOUNTER — Ambulatory Visit: Payer: Self-pay | Admitting: Urology

## 2020-10-21 VITALS — BP 128/75 | HR 72 | Temp 97.9°F | Resp 18 | Wt 149.0 lb

## 2020-10-21 DIAGNOSIS — C787 Secondary malignant neoplasm of liver and intrahepatic bile duct: Secondary | ICD-10-CM | POA: Insufficient documentation

## 2020-10-21 DIAGNOSIS — Z79899 Other long term (current) drug therapy: Secondary | ICD-10-CM | POA: Diagnosis not present

## 2020-10-21 DIAGNOSIS — R634 Abnormal weight loss: Secondary | ICD-10-CM | POA: Insufficient documentation

## 2020-10-21 DIAGNOSIS — C3490 Malignant neoplasm of unspecified part of unspecified bronchus or lung: Secondary | ICD-10-CM

## 2020-10-21 DIAGNOSIS — C7931 Secondary malignant neoplasm of brain: Secondary | ICD-10-CM | POA: Insufficient documentation

## 2020-10-21 DIAGNOSIS — C4372 Malignant melanoma of left lower limb, including hip: Secondary | ICD-10-CM | POA: Diagnosis not present

## 2020-10-21 DIAGNOSIS — C3431 Malignant neoplasm of lower lobe, right bronchus or lung: Secondary | ICD-10-CM | POA: Insufficient documentation

## 2020-10-21 DIAGNOSIS — R5383 Other fatigue: Secondary | ICD-10-CM | POA: Insufficient documentation

## 2020-10-21 LAB — CMP (CANCER CENTER ONLY)
ALT: 13 U/L (ref 0–44)
AST: 10 U/L — ABNORMAL LOW (ref 15–41)
Albumin: 3.5 g/dL (ref 3.5–5.0)
Alkaline Phosphatase: 53 U/L (ref 38–126)
Anion gap: 6 (ref 5–15)
BUN: 20 mg/dL (ref 8–23)
CO2: 27 mmol/L (ref 22–32)
Calcium: 8.8 mg/dL — ABNORMAL LOW (ref 8.9–10.3)
Chloride: 101 mmol/L (ref 98–111)
Creatinine: 1.39 mg/dL — ABNORMAL HIGH (ref 0.61–1.24)
GFR, Estimated: 54 mL/min — ABNORMAL LOW (ref >60)
Glucose, Bld: 93 mg/dL (ref 70–99)
Potassium: 4 mmol/L (ref 3.5–5.1)
Sodium: 134 mmol/L — ABNORMAL LOW (ref 135–145)
Total Bilirubin: 0.5 mg/dL (ref 0.3–1.2)
Total Protein: 5.5 g/dL — ABNORMAL LOW (ref 6.5–8.1)

## 2020-10-21 LAB — CBC WITH DIFFERENTIAL (CANCER CENTER ONLY)
Abs Immature Granulocytes: 0.52 10*3/uL — ABNORMAL HIGH (ref 0.00–0.07)
Basophils Absolute: 0 10*3/uL (ref 0.0–0.1)
Basophils Relative: 0 %
Eosinophils Absolute: 0 10*3/uL (ref 0.0–0.5)
Eosinophils Relative: 0 %
HCT: 23.3 % — ABNORMAL LOW (ref 39.0–52.0)
Hemoglobin: 8.2 g/dL — ABNORMAL LOW (ref 13.0–17.0)
Immature Granulocytes: 7 %
Lymphocytes Relative: 24 %
Lymphs Abs: 1.7 10*3/uL (ref 0.7–4.0)
MCH: 36.3 pg — ABNORMAL HIGH (ref 26.0–34.0)
MCHC: 35.2 g/dL (ref 30.0–36.0)
MCV: 103.1 fL — ABNORMAL HIGH (ref 80.0–100.0)
Monocytes Absolute: 0.5 10*3/uL (ref 0.1–1.0)
Monocytes Relative: 7 %
Neutro Abs: 4.3 10*3/uL (ref 1.7–7.7)
Neutrophils Relative %: 62 %
Platelet Count: 56 10*3/uL — ABNORMAL LOW (ref 150–400)
RBC: 2.26 MIL/uL — ABNORMAL LOW (ref 4.22–5.81)
RDW: 20.8 % — ABNORMAL HIGH (ref 11.5–15.5)
WBC Count: 7 10*3/uL (ref 4.0–10.5)
nRBC: 0 % (ref 0.0–0.2)

## 2020-10-21 LAB — LACTATE DEHYDROGENASE: LDH: 153 U/L (ref 98–192)

## 2020-10-21 LAB — PREPARE RBC (CROSSMATCH)

## 2020-10-21 LAB — SAMPLE TO BLOOD BANK

## 2020-10-21 MED ORDER — DIPHENHYDRAMINE HCL 25 MG PO CAPS
ORAL_CAPSULE | ORAL | Status: AC
  Filled 2020-10-21: qty 1

## 2020-10-21 MED ORDER — ACETAMINOPHEN 325 MG PO TABS
ORAL_TABLET | ORAL | Status: AC
  Filled 2020-10-21: qty 2

## 2020-10-21 MED ORDER — ACETAMINOPHEN 325 MG PO TABS
650.0000 mg | ORAL_TABLET | Freq: Once | ORAL | Status: AC
  Administered 2020-10-21: 650 mg via ORAL

## 2020-10-21 MED ORDER — SODIUM CHLORIDE 0.9% IV SOLUTION
250.0000 mL | Freq: Once | INTRAVENOUS | Status: AC
  Administered 2020-10-21: 250 mL via INTRAVENOUS
  Filled 2020-10-21: qty 250

## 2020-10-21 MED ORDER — DIPHENHYDRAMINE HCL 25 MG PO CAPS
25.0000 mg | ORAL_CAPSULE | Freq: Once | ORAL | Status: AC
Start: 2020-10-21 — End: 2020-10-21
  Administered 2020-10-21: 25 mg via ORAL

## 2020-10-21 NOTE — Patient Instructions (Signed)

## 2020-10-21 NOTE — Progress Notes (Signed)
Hematology and Oncology Follow Up Visit  Sabino Denning [id number] 1950/07/10 70 y.o. 10/21/2020   Principle Diagnosis:  Stage IB (T2aN0M0) superficial spreading melanoma of the left lower leg  Small Cell Lung Cancer -- Extensive Stage -- lung/liver/brain mets  Past Therapy: Cranial XRT -- 06/19/2019 thru 07/02/2019  Current Therapy:        Carboplatin/VP-16/Tecentriq -- started on 07/07/2018, s/p cycle #4 Tecentriq - maintenance -- cycle#1 -- start on 10/01/2019 - d/c on 10/21/2019 Zepzelca/Keytruda -- q 3 wk --s/p cycle #6 - started on 10/28/2019 -- d/c on 03/15/2020 CDDP/Irinotecan -- s/p cycle #5 -- started on 03/23/2020   Interim History:  Mr. Hetz is here today for follow-up.  He comes in with his wife.  He comes in in a wheelchair.  Feels okay he says.  His wife says that he is doing a bit better.  Henrene Pastor did have radiosurgery for a couple brain lesions.  He got through this without difficulty.  We did do a PET scan on him.  This was done 2 days ago.  PET scan, thankfully, did not show any evidence of cancer growth.  There is no new small cell lung cancer disease.  He seems to be managing fairly well with the cisplatin/irinotecan.  We hold day 8 of the irinotecan because of blood counts.  He has had some fatigue.  He says he is quite fatigued right now.  His hemoglobin is 8.2.  I think that we probably are going to have to transfuse him to try to improve his quality of life.  He has had no diarrhea.  He has had no bleeding.  There is been no leg swelling.  He has had no rashes.  Overall, I would say his performance status is probably ECOG 2.     Medications:  Allergies as of 10/21/2020   No Known Allergies     Medication List       Accurate as of Oct 21, 2020 12:55 PM. If you have any questions, ask your nurse or doctor.        acetaminophen 500 MG tablet Commonly known as: TYLENOL Take 1,000 mg by mouth every 6 (six) hours as needed for moderate pain.    ALPRAZolam 0.5 MG tablet Commonly known as: XANAX Take 1 tablet (0.5 mg total) by mouth every 6 (six) hours as needed for anxiety.   dexamethasone 4 MG tablet Commonly known as: DECADRON Dexamethasone 4 mg TID x 2 days, BID x 2 days, then 2 mg (1/2 tab) BID x 7 days, then 2 mg (1/2 tab) QD x 7 days then stop.   dexamethasone 4 MG tablet Commonly known as: DECADRON Take 2 tablets (8 mg total) by mouth 2 (two) times daily. You must take with food in the morning and at dinner.   diphenoxylate-atropine 2.5-0.025 MG tablet Commonly known as: LOMOTIL Take 2 tablets at onset of diarrhea. Take one tablet after each loose stool. Max 8 tablets per day.   dronabinol 5 MG capsule Commonly known as: MARINOL Take 1 capsule (5 mg total) by mouth 2 (two) times daily before a meal.   finasteride 5 MG tablet Commonly known as: PROSCAR TAKE 1 TABLET BY MOUTH EVERY DAY   furosemide 20 MG tablet Commonly known as: LASIX Take 2 tablets (40 mg total) by mouth daily.   levETIRAcetam 500 MG tablet Commonly known as: Keppra Take 1 tablet (500 mg total) by mouth 2 (two) times daily.   levETIRAcetam 500 MG tablet Commonly known as:  KEPPRA Take 1 tablet (500 mg total) by mouth 2 (two) times daily.   lidocaine-prilocaine cream Commonly known as: EMLA Apply to affected area once   loperamide 2 MG capsule Commonly known as: IMODIUM   LORazepam 0.5 MG tablet Commonly known as: Ativan Take 1 tablet (0.5 mg total) by mouth every 6 (six) hours as needed (Nausea or vomiting).   multivitamin capsule Take 1 capsule by mouth daily.   omeprazole 40 MG capsule Commonly known as: PRILOSEC Take 1 capsule (40 mg total) by mouth 2 (two) times daily. Please call [phone] to schedule an office visit for more refills. What changed:   when to take this  additional instructions Changed by: Jackquline Denmark, MD   ondansetron 4 MG tablet Commonly known as: ZOFRAN Take 1 tablet (4 mg total) by mouth every  6 (six) hours as needed for nausea.   ondansetron 8 MG tablet Commonly known as: Zofran Take 1 tablet (8 mg total) by mouth 2 (two) times daily as needed. Start on the third day after cisplatin chemotherapy.   ondansetron 8 MG disintegrating tablet Commonly known as: ZOFRAN-ODT Take 8 mg by mouth.   OPCON-A OP Place 2 drops into both eyes as needed (for dry eyes).   prochlorperazine 10 MG tablet Commonly known as: COMPAZINE Take 1 tablet (10 mg total) by mouth every 6 (six) hours as needed (Nausea or vomiting).   tamsulosin 0.4 MG Caps capsule Commonly known as: FLOMAX Take 1 capsule (0.4 mg total) by mouth daily.       Allergies: No Known Allergies  Past Medical History, Surgical history, Social history, and Family History were reviewed and updated.  Review of Systems: Review of Systems  Constitutional: Positive for weight loss.  HENT: Negative.   Eyes: Negative.   Respiratory: Negative.   Cardiovascular: Negative.   Gastrointestinal: Negative.   Genitourinary: Negative.   Musculoskeletal: Negative.   Skin: Negative.   Neurological: Negative.   Endo/Heme/Allergies: Negative.   Psychiatric/Behavioral: Negative.      Physical Exam:  weight is 149 lb (67.6 kg). His oral temperature is 97.9 F (36.6 C). His blood pressure is 128/75 and his pulse is 72. His respiration is 18 and oxygen saturation is 100%.   Wt Readings from Last 3 Encounters:  10/21/20 149 lb (67.6 kg)  09/25/20 151 lb 6.4 oz (68.7 kg)  09/07/20 151 lb (68.5 kg)    Physical Exam Vitals reviewed.  HENT:     Head: Normocephalic and atraumatic.  Eyes:     Pupils: Pupils are equal, round, and reactive to light.  Cardiovascular:     Rate and Rhythm: Normal rate and regular rhythm.     Heart sounds: Normal heart sounds.  Pulmonary:     Effort: Pulmonary effort is normal.     Breath sounds: Normal breath sounds.  Abdominal:     General: Bowel sounds are normal.     Palpations: Abdomen is  soft.  Musculoskeletal:        General: No tenderness or deformity. Normal range of motion.     Cervical back: Normal range of motion.  Lymphadenopathy:     Cervical: No cervical adenopathy.  Skin:    General: Skin is warm and dry.     Findings: No erythema or rash.  Neurological:     Mental Status: He is alert and oriented to person, place, and time.  Psychiatric:        Behavior: Behavior normal.        Thought  Content: Thought content normal.        Judgment: Judgment normal.      Lab Results  Component Value Date   WBC 7.0 10/21/2020   HGB 8.2 (L) 10/21/2020   HCT 23.3 (L) 10/21/2020   MCV 103.1 (H) 10/21/2020   PLT 56 (L) 10/21/2020   Lab Results  Component Value Date   FERRITIN 2,549 (H) 10/21/2019   IRON 43 10/21/2019   TIBC 225 10/21/2019   UIBC 182 10/21/2019   IRONPCTSAT 19 (L) 10/21/2019   Lab Results  Component Value Date   RETICCTPCT 1.5 08/07/2019   RBC 2.26 (L) 10/21/2020   No results found for: KPAFRELGTCHN, LAMBDASER, KAPLAMBRATIO No results found for: IGGSERUM, IGA, IGMSERUM No results found for: Kathrynn Ducking, MSPIKE, SPEI   Chemistry      Component Value Date/Time   NA 134 (L) 10/21/2020 0829   NA 142 01/30/2017 0804   K 4.0 10/21/2020 0829   K 4.1 01/30/2017 0804   CL 101 10/21/2020 0829   CL 105 01/25/2016 0926   CO2 27 10/21/2020 0829   CO2 29 01/30/2017 0804   BUN 20 10/21/2020 0829   BUN 6.9 (L) 01/30/2017 0804   CREATININE 1.39 (H) 10/21/2020 0829   CREATININE 0.8 01/30/2017 0804      Component Value Date/Time   CALCIUM 8.8 (L) 10/21/2020 0829   CALCIUM 10.5 (H) 01/30/2017 0804   ALKPHOS 53 10/21/2020 0829   ALKPHOS 79 01/30/2017 0804   AST 10 (L) 10/21/2020 0829   AST 34 01/30/2017 0804   ALT 13 10/21/2020 0829   ALT 18 01/30/2017 0804   BILITOT 0.5 10/21/2020 0829   BILITOT 1.49 (H) 01/30/2017 0804       Impression and Plan: Mr. Choplin is a very pleasant 70 yo caucasian  gentleman with history of stage 1b melanoma of the left lower leg.   This really is not a problem right now.  He now has extensive stage small cell lung cancer.  I am going to hold his chemotherapy this week.  His platelet count is little bit too low.  I just do not think that he is ready for treatment.  I do want to give him 2 units of packed red blood cells.  I think this will help him out.  I think we can do the transfusions today.  Systemically, he is holding his own.  At some point, I know that he will progress.  I worry more that he is going to have new brain metastasis.  It certainly appears that his small cell lung cancer has a predilection for his brain.  Again, we are just dealing with quality of life issues.  This is what is important.  I know that the packed red blood cells will help him.  Again, we will hold his chemotherapy this week.  He will come back next week for his sixth cycle of treatment.  I will plan to see him back for his seventh cycle of treatment.     Volanda Napoleon, MD 5/18/202212:55 PM

## 2020-10-21 NOTE — Telephone Encounter (Signed)
appts made per 10/21/20 los and pt to gain sch in tx/avs and through [employer]

## 2020-10-21 NOTE — Patient Instructions (Signed)
Implanted Port Insertion, Care After This sheet gives you information about how to care for yourself after your procedure. Your health care provider may also give you more specific instructions. If you have problems or questions, contact your health care provider. What can I expect after the procedure? After the procedure, it is common to have:  Discomfort at the port insertion site.  Bruising on the skin over the port. This should improve over 3-4 days. Follow these instructions at home: Port care  After your port is placed, you will get a manufacturer's information card. The card has information about your port. Keep this card with you at all times.  Take care of the port as told by your health care provider. Ask your health care provider if you or a family member can get training for taking care of the port at home. A home health care nurse may also take care of the port.  Make sure to remember what type of port you have. Incision care  Follow instructions from your health care provider about how to take care of your port insertion site. Make sure you: ? Wash your hands with soap and water before and after you change your bandage (dressing). If soap and water are not available, use hand sanitizer. ? Change your dressing as told by your health care provider. ? Leave stitches (sutures), skin glue, or adhesive strips in place. These skin closures may need to stay in place for 2 weeks or longer. If adhesive strip edges start to loosen and curl up, you may trim the loose edges. Do not remove adhesive strips completely unless your health care provider tells you to do that.  Check your port insertion site every day for signs of infection. Check for: ? Redness, swelling, or pain. ? Fluid or blood. ? Warmth. ? Pus or a bad smell.      Activity  Return to your normal activities as told by your health care provider. Ask your health care provider what activities are safe for you.  Do not  lift anything that is heavier than 10 lb (4.5 kg), or the limit that you are told, until your health care provider says that it is safe. General instructions  Take over-the-counter and prescription medicines only as told by your health care provider.  Do not take baths, swim, or use a hot tub until your health care provider approves. Ask your health care provider if you may take showers. You may only be allowed to take sponge baths.  Do not drive for 24 hours if you were given a sedative during your procedure.  Wear a medical alert bracelet in case of an emergency. This will tell any health care providers that you have a port.  Keep all follow-up visits as told by your health care provider. This is important. Contact a health care provider if:  You cannot flush your port with saline as directed, or you cannot draw blood from the port.  You have a fever or chills.  You have redness, swelling, or pain around your port insertion site.  You have fluid or blood coming from your port insertion site.  Your port insertion site feels warm to the touch.  You have pus or a bad smell coming from the port insertion site. Get help right away if:  You have chest pain or shortness of breath.  You have bleeding from your port that you cannot control. Summary  Take care of the port as told by your   health care provider. Keep the manufacturer's information card with you at all times.  Change your dressing as told by your health care provider.  Contact a health care provider if you have a fever or chills or if you have redness, swelling, or pain around your port insertion site.  Keep all follow-up visits as told by your health care provider. This information is not intended to replace advice given to you by your health care provider. Make sure you discuss any questions you have with your health care provider. Document Revised: 12/19/2017 Document Reviewed: 12/19/2017 Elsevier Patient Education   2021 [employer]  

## 2020-10-21 NOTE — Progress Notes (Signed)
Oncology Nurse Navigator Documentation  Oncology Nurse Navigator Flowsheets 10/21/2020  Abnormal Finding Date -  Confirmed Diagnosis Date -  Diagnosis Status -  Planned Course of Treatment -  Phase of Treatment -  Chemotherapy Actual Start Date: -  Radiation Actual Start Date: -  Navigator Follow Up Date: 10/26/2020  Navigator Follow Up Reason: Follow-up Appointment;Chemotherapy  Navigator Restaurant manager, [employer] Encounter Type Appt/Treatment Plan Review  Telephone -  Treatment Initiated Date -  Patient Visit Type MedOnc  Treatment Phase Active Tx  Barriers/Navigation Needs Coordination of Care;Education  Education -  Interventions None Required  Acuity Level 2-Minimal Needs (1-2 Barriers Identified)  Referrals -  Coordination of Care -  Education Method -  Support Groups/Services Friends and Family  Time Spent with Patient 15

## 2020-10-22 LAB — BPAM RBC
Blood Product Expiration Date: 202206142359
Blood Product Expiration Date: 202206142359
ISSUE DATE / TIME: 202205181119
ISSUE DATE / TIME: 202205181119
Unit Type and Rh: 5100
Unit Type and Rh: 5100

## 2020-10-22 LAB — TYPE AND SCREEN
ABO/RH(D): O POS
Antibody Screen: NEGATIVE
Unit division: 0
Unit division: 0

## 2020-10-26 ENCOUNTER — Telehealth: Payer: Self-pay

## 2020-10-26 ENCOUNTER — Inpatient Hospital Stay: Payer: Medicare Other

## 2020-10-26 ENCOUNTER — Encounter: Payer: Self-pay | Admitting: *Deleted

## 2020-10-26 ENCOUNTER — Other Ambulatory Visit: Payer: Self-pay

## 2020-10-26 DIAGNOSIS — R634 Abnormal weight loss: Secondary | ICD-10-CM | POA: Diagnosis not present

## 2020-10-26 DIAGNOSIS — C3490 Malignant neoplasm of unspecified part of unspecified bronchus or lung: Secondary | ICD-10-CM

## 2020-10-26 DIAGNOSIS — C4372 Malignant melanoma of left lower limb, including hip: Secondary | ICD-10-CM | POA: Diagnosis not present

## 2020-10-26 DIAGNOSIS — C7931 Secondary malignant neoplasm of brain: Secondary | ICD-10-CM | POA: Diagnosis not present

## 2020-10-26 DIAGNOSIS — Z95828 Presence of other vascular implants and grafts: Secondary | ICD-10-CM

## 2020-10-26 DIAGNOSIS — R5383 Other fatigue: Secondary | ICD-10-CM | POA: Diagnosis not present

## 2020-10-26 DIAGNOSIS — C787 Secondary malignant neoplasm of liver and intrahepatic bile duct: Secondary | ICD-10-CM | POA: Diagnosis not present

## 2020-10-26 DIAGNOSIS — C3431 Malignant neoplasm of lower lobe, right bronchus or lung: Secondary | ICD-10-CM | POA: Diagnosis not present

## 2020-10-26 LAB — CMP (CANCER CENTER ONLY)
ALT: 13 U/L (ref 0–44)
AST: 11 U/L — ABNORMAL LOW (ref 15–41)
Albumin: 3.8 g/dL (ref 3.5–5.0)
Alkaline Phosphatase: 51 U/L (ref 38–126)
Anion gap: 8 (ref 5–15)
BUN: 16 mg/dL (ref 8–23)
CO2: 27 mmol/L (ref 22–32)
Calcium: 9 mg/dL (ref 8.9–10.3)
Chloride: 103 mmol/L (ref 98–111)
Creatinine: 1.39 mg/dL — ABNORMAL HIGH (ref 0.61–1.24)
GFR, Estimated: 54 mL/min — ABNORMAL LOW (ref >60)
Glucose, Bld: 96 mg/dL (ref 70–99)
Potassium: 3.7 mmol/L (ref 3.5–5.1)
Sodium: 138 mmol/L (ref 135–145)
Total Bilirubin: 0.6 mg/dL (ref 0.3–1.2)
Total Protein: 5.9 g/dL — ABNORMAL LOW (ref 6.5–8.1)

## 2020-10-26 LAB — CBC WITH DIFFERENTIAL (CANCER CENTER ONLY)
Abs Immature Granulocytes: 0.31 10*3/uL — ABNORMAL HIGH (ref 0.00–0.07)
Basophils Absolute: 0 10*3/uL (ref 0.0–0.1)
Basophils Relative: 0 %
Eosinophils Absolute: 0 10*3/uL (ref 0.0–0.5)
Eosinophils Relative: 0 %
HCT: 32.8 % — ABNORMAL LOW (ref 39.0–52.0)
Hemoglobin: 11.5 g/dL — ABNORMAL LOW (ref 13.0–17.0)
Immature Granulocytes: 6 %
Lymphocytes Relative: 35 %
Lymphs Abs: 1.9 10*3/uL (ref 0.7–4.0)
MCH: 34.2 pg — ABNORMAL HIGH (ref 26.0–34.0)
MCHC: 35.1 g/dL (ref 30.0–36.0)
MCV: 97.6 fL (ref 80.0–100.0)
Monocytes Absolute: 0.5 10*3/uL (ref 0.1–1.0)
Monocytes Relative: 8 %
Neutro Abs: 2.8 10*3/uL (ref 1.7–7.7)
Neutrophils Relative %: 51 %
Platelet Count: 53 10*3/uL — ABNORMAL LOW (ref 150–400)
RBC: 3.36 MIL/uL — ABNORMAL LOW (ref 4.22–5.81)
RDW: 19.4 % — ABNORMAL HIGH (ref 11.5–15.5)
WBC Count: 5.6 10*3/uL (ref 4.0–10.5)
nRBC: 0 % (ref 0.0–0.2)

## 2020-10-26 MED ORDER — HEPARIN SOD (PORK) LOCK FLUSH 100 UNIT/ML IV SOLN
500.0000 [IU] | Freq: Once | INTRAVENOUS | Status: AC
  Administered 2020-10-26: 500 [IU] via INTRAVENOUS
  Filled 2020-10-26: qty 5

## 2020-10-26 MED ORDER — SODIUM CHLORIDE 0.9% FLUSH
10.0000 mL | Freq: Once | INTRAVENOUS | Status: AC
  Administered 2020-10-26: 10 mL via INTRAVENOUS
  Filled 2020-10-26: qty 10

## 2020-10-26 NOTE — Telephone Encounter (Signed)
Pt aware of new appt time per sch message for 11/05/20   Walter Hernandez

## 2020-10-26 NOTE — Progress Notes (Signed)
Reviewed pt labs with Dr. Marin Olp and treatment to be held for one week. Pt educated on plan of care and verbalized understanding. Pt aware to continue to rest and that there was nothing he could do to make his counts better. All questions answered and pt had no further questions. Pt aware scheduling will reach out with his appt for next week.

## 2020-10-26 NOTE — Patient Instructions (Signed)

## 2020-10-26 NOTE — Progress Notes (Signed)
Patient's counts still not recovered. Treatment pushed back an additional week. Patient states he is doing well.   Oncology Nurse Navigator Documentation  Oncology Nurse Navigator Flowsheets 10/26/2020  Abnormal Finding Date -  Confirmed Diagnosis Date -  Diagnosis Status -  Planned Course of Treatment -  Phase of Treatment -  Chemotherapy Actual Start Date: -  Radiation Actual Start Date: -  Navigator Follow Up Date: 11/05/2020  Navigator Follow Up Reason: Chemotherapy  Navigator Location [facility]  Navigator Encounter Type Treatment  Telephone -  Treatment Initiated Date -  Patient Visit Type MedOnc  Treatment Phase Active Tx  Barriers/Navigation Needs Coordination of Care;Education  Education -  Interventions Psycho-Social Support  Acuity Level 2-Minimal Needs (1-2 Barriers Identified)  Referrals -  Coordination of Care -  Education Method -  Support Groups/Services Friends and Family  Time Spent with Patient 15

## 2020-10-27 ENCOUNTER — Encounter: Payer: Self-pay | Admitting: *Deleted

## 2020-10-27 NOTE — Progress Notes (Signed)
Received a call from patient's wife, Colletta Maryland, regarding the PET results. She wonders if there is any concern, or need for referrals for the findings concerning patient's bladder and ascending aorta.   Reviewed PET with Dr Marin Olp. He is not concerned with these findings and referrals are not necessary. We will continue to follow with PET scans, and if these findings become more severe or worrisome, further workup can be done at that time.   Notified Colletta Maryland of his response.   Oncology Nurse Navigator Documentation  Oncology Nurse Navigator Flowsheets 10/27/2020  Abnormal Finding Date -  Confirmed Diagnosis Date -  Diagnosis Status -  Planned Course of Treatment -  Phase of Treatment -  Chemotherapy Actual Start Date: -  Radiation Actual Start Date: -  Navigator Follow Up Date: 11/05/2020  Navigator Follow Up Reason: Chemotherapy  Navigator Location [facility]  Navigator Encounter Type Telephone  Telephone Diagnostic Results;Incoming Call  Treatment Initiated Date -  Patient Visit Type MedOnc  Treatment Phase Active Tx  Barriers/Navigation Needs Coordination of Care;Education  Education Other  Interventions Education;Psycho-Social Support  Acuity Level 2-Minimal Needs (1-2 Barriers Identified)  Referrals -  Coordination of Care -  Education Method Verbal  Support Groups/Services Friends and Family  Time Spent with Patient 79

## 2020-11-04 ENCOUNTER — Encounter: Payer: Self-pay | Admitting: *Deleted

## 2020-11-04 ENCOUNTER — Other Ambulatory Visit: Payer: Self-pay | Admitting: *Deleted

## 2020-11-04 DIAGNOSIS — T451X5A Adverse effect of antineoplastic and immunosuppressive drugs, initial encounter: Secondary | ICD-10-CM

## 2020-11-04 DIAGNOSIS — Z95828 Presence of other vascular implants and grafts: Secondary | ICD-10-CM

## 2020-11-04 DIAGNOSIS — C7931 Secondary malignant neoplasm of brain: Secondary | ICD-10-CM

## 2020-11-04 DIAGNOSIS — D701 Agranulocytosis secondary to cancer chemotherapy: Secondary | ICD-10-CM

## 2020-11-04 DIAGNOSIS — D649 Anemia, unspecified: Secondary | ICD-10-CM

## 2020-11-04 DIAGNOSIS — C3490 Malignant neoplasm of unspecified part of unspecified bronchus or lung: Secondary | ICD-10-CM

## 2020-11-04 NOTE — Progress Notes (Signed)
Patient's wife, Colletta Maryland, called concerned with patient's continued anemia. She is aware that prolonged treatment with chemo can cause anemia, both acute and chronic, but she also wonders if there may be other reasons for his anemia. She states patient has frequent fatigue, weakness and lack of stamina. She is concerned he won't be able to receive his treatment tomorrow.   We will run more lab work tomorrow for other possible causes of anemia/fatigue.  Oncology Nurse Navigator Documentation  Oncology Nurse Navigator Flowsheets 11/04/2020  Abnormal Finding Date -  Confirmed Diagnosis Date -  Diagnosis Status -  Planned Course of Treatment -  Phase of Treatment -  Chemotherapy Actual Start Date: -  Radiation Actual Start Date: -  Navigator Follow Up Date: 11/05/2020  Navigator Follow Up Reason: Chemotherapy  Navigator Location [facility]  Navigator Encounter Type Telephone  Telephone Education;Incoming Call;Symptom Mgt  Treatment Initiated Date -  Patient Visit Type MedOnc  Treatment Phase Active Tx  Barriers/Navigation Needs Coordination of Care;Education  Education Pain/ Symptom Management  Interventions Coordination of Care;Education;Psycho-Social Support  Acuity Level 2-Minimal Needs (1-2 Barriers Identified)  Referrals -  Coordination of Care Other  Education Method Verbal  Support Groups/Services Friends and Family  Time Spent with Patient 30

## 2020-11-05 ENCOUNTER — Other Ambulatory Visit: Payer: Self-pay | Admitting: Gastroenterology

## 2020-11-05 ENCOUNTER — Inpatient Hospital Stay: Payer: Medicare Other

## 2020-11-05 ENCOUNTER — Inpatient Hospital Stay: Payer: Medicare Other | Attending: Hematology & Oncology

## 2020-11-05 ENCOUNTER — Other Ambulatory Visit: Payer: Self-pay

## 2020-11-05 ENCOUNTER — Encounter: Payer: Self-pay | Admitting: *Deleted

## 2020-11-05 VITALS — BP 123/85 | HR 96 | Temp 97.4°F | Resp 17

## 2020-11-05 DIAGNOSIS — C7931 Secondary malignant neoplasm of brain: Secondary | ICD-10-CM | POA: Insufficient documentation

## 2020-11-05 DIAGNOSIS — M545 Low back pain, unspecified: Secondary | ICD-10-CM | POA: Insufficient documentation

## 2020-11-05 DIAGNOSIS — R197 Diarrhea, unspecified: Secondary | ICD-10-CM | POA: Diagnosis not present

## 2020-11-05 DIAGNOSIS — Z5111 Encounter for antineoplastic chemotherapy: Secondary | ICD-10-CM | POA: Diagnosis not present

## 2020-11-05 DIAGNOSIS — C787 Secondary malignant neoplasm of liver and intrahepatic bile duct: Secondary | ICD-10-CM | POA: Diagnosis not present

## 2020-11-05 DIAGNOSIS — D701 Agranulocytosis secondary to cancer chemotherapy: Secondary | ICD-10-CM

## 2020-11-05 DIAGNOSIS — C3490 Malignant neoplasm of unspecified part of unspecified bronchus or lung: Secondary | ICD-10-CM

## 2020-11-05 DIAGNOSIS — R350 Frequency of micturition: Secondary | ICD-10-CM | POA: Diagnosis not present

## 2020-11-05 DIAGNOSIS — R32 Unspecified urinary incontinence: Secondary | ICD-10-CM | POA: Insufficient documentation

## 2020-11-05 DIAGNOSIS — R410 Disorientation, unspecified: Secondary | ICD-10-CM | POA: Insufficient documentation

## 2020-11-05 DIAGNOSIS — R5383 Other fatigue: Secondary | ICD-10-CM | POA: Insufficient documentation

## 2020-11-05 DIAGNOSIS — C4372 Malignant melanoma of left lower limb, including hip: Secondary | ICD-10-CM | POA: Diagnosis not present

## 2020-11-05 DIAGNOSIS — D649 Anemia, unspecified: Secondary | ICD-10-CM

## 2020-11-05 DIAGNOSIS — Z79899 Other long term (current) drug therapy: Secondary | ICD-10-CM | POA: Insufficient documentation

## 2020-11-05 DIAGNOSIS — C3431 Malignant neoplasm of lower lobe, right bronchus or lung: Secondary | ICD-10-CM | POA: Diagnosis not present

## 2020-11-05 LAB — CBC WITH DIFFERENTIAL (CANCER CENTER ONLY)
Abs Immature Granulocytes: 0.14 10*3/uL — ABNORMAL HIGH (ref 0.00–0.07)
Basophils Absolute: 0 10*3/uL (ref 0.0–0.1)
Basophils Relative: 0 %
Eosinophils Absolute: 0 10*3/uL (ref 0.0–0.5)
Eosinophils Relative: 0 %
HCT: 28.8 % — ABNORMAL LOW (ref 39.0–52.0)
Hemoglobin: 10.4 g/dL — ABNORMAL LOW (ref 13.0–17.0)
Immature Granulocytes: 3 %
Lymphocytes Relative: 40 %
Lymphs Abs: 1.8 10*3/uL (ref 0.7–4.0)
MCH: 34.6 pg — ABNORMAL HIGH (ref 26.0–34.0)
MCHC: 36.1 g/dL — ABNORMAL HIGH (ref 30.0–36.0)
MCV: 95.7 fL (ref 80.0–100.0)
Monocytes Absolute: 0.6 10*3/uL (ref 0.1–1.0)
Monocytes Relative: 13 %
Neutro Abs: 1.9 10*3/uL (ref 1.7–7.7)
Neutrophils Relative %: 44 %
Platelet Count: 61 10*3/uL — ABNORMAL LOW (ref 150–400)
RBC: 3.01 MIL/uL — ABNORMAL LOW (ref 4.22–5.81)
RDW: 18.4 % — ABNORMAL HIGH (ref 11.5–15.5)
WBC Count: 4.5 10*3/uL (ref 4.0–10.5)
nRBC: 0 % (ref 0.0–0.2)

## 2020-11-05 LAB — CMP (CANCER CENTER ONLY)
ALT: 9 U/L (ref 0–44)
AST: 11 U/L — ABNORMAL LOW (ref 15–41)
Albumin: 3.9 g/dL (ref 3.5–5.0)
Alkaline Phosphatase: 64 U/L (ref 38–126)
Anion gap: 9 (ref 5–15)
BUN: 10 mg/dL (ref 8–23)
CO2: 26 mmol/L (ref 22–32)
Calcium: 9.7 mg/dL (ref 8.9–10.3)
Chloride: 97 mmol/L — ABNORMAL LOW (ref 98–111)
Creatinine: 1.51 mg/dL — ABNORMAL HIGH (ref 0.61–1.24)
GFR, Estimated: 49 mL/min — ABNORMAL LOW (ref >60)
Glucose, Bld: 117 mg/dL — ABNORMAL HIGH (ref 70–99)
Potassium: 3.6 mmol/L (ref 3.5–5.1)
Sodium: 132 mmol/L — ABNORMAL LOW (ref 135–145)
Total Bilirubin: 0.6 mg/dL (ref 0.3–1.2)
Total Protein: 6.3 g/dL — ABNORMAL LOW (ref 6.5–8.1)

## 2020-11-05 LAB — VITAMIN B12: Vitamin B-12: 720 pg/mL (ref 180–914)

## 2020-11-05 LAB — FERRITIN: Ferritin: 3494 ng/mL — ABNORMAL HIGH (ref 24–336)

## 2020-11-05 LAB — IRON AND TIBC
Iron: 115 ug/dL (ref 42–163)
Saturation Ratios: 59 % — ABNORMAL HIGH (ref 20–55)
TIBC: 195 ug/dL — ABNORMAL LOW (ref 202–409)
UIBC: 80 ug/dL — ABNORMAL LOW (ref 117–376)

## 2020-11-05 LAB — SAMPLE TO BLOOD BANK

## 2020-11-05 LAB — TSH: TSH: 3.489 u[IU]/mL (ref 0.320–4.118)

## 2020-11-05 LAB — LACTATE DEHYDROGENASE: LDH: 166 U/L (ref 98–192)

## 2020-11-05 MED ORDER — DEXAMETHASONE SODIUM PHOSPHATE 100 MG/10ML IJ SOLN
10.0000 mg | Freq: Once | INTRAMUSCULAR | Status: AC
Start: 2020-11-05 — End: 2020-11-05
  Administered 2020-11-05: 10 mg via INTRAVENOUS
  Filled 2020-11-05: qty 10

## 2020-11-05 MED ORDER — SODIUM CHLORIDE 0.9 % IV SOLN
30.0000 mg/m2 | Freq: Once | INTRAVENOUS | Status: AC
  Administered 2020-11-05: 53 mg via INTRAVENOUS
  Filled 2020-11-05: qty 53

## 2020-11-05 MED ORDER — PALONOSETRON HCL INJECTION 0.25 MG/5ML
INTRAVENOUS | Status: AC
  Filled 2020-11-05: qty 5

## 2020-11-05 MED ORDER — HEPARIN SOD (PORK) LOCK FLUSH 100 UNIT/ML IV SOLN
500.0000 [IU] | Freq: Once | INTRAVENOUS | Status: AC | PRN
  Administered 2020-11-05: 500 [IU]
  Filled 2020-11-05: qty 5

## 2020-11-05 MED ORDER — SODIUM CHLORIDE 0.9 % IV SOLN
150.0000 mg | Freq: Once | INTRAVENOUS | Status: AC
  Administered 2020-11-05: 150 mg via INTRAVENOUS
  Filled 2020-11-05: qty 150

## 2020-11-05 MED ORDER — ACETAMINOPHEN 325 MG PO TABS
ORAL_TABLET | ORAL | Status: AC
  Filled 2020-11-05: qty 2

## 2020-11-05 MED ORDER — SODIUM CHLORIDE 0.9 % IV SOLN
60.0000 mg/m2 | Freq: Once | INTRAVENOUS | Status: AC
  Administered 2020-11-05: 100 mg via INTRAVENOUS
  Filled 2020-11-05: qty 5

## 2020-11-05 MED ORDER — PALONOSETRON HCL INJECTION 0.25 MG/5ML
0.2500 mg | Freq: Once | INTRAVENOUS | Status: AC
Start: 2020-11-05 — End: 2020-11-05
  Administered 2020-11-05: 0.25 mg via INTRAVENOUS

## 2020-11-05 MED ORDER — POTASSIUM CHLORIDE IN NACL 20-0.9 MEQ/L-% IV SOLN
Freq: Once | INTRAVENOUS | Status: AC
  Filled 2020-11-05: qty 1000

## 2020-11-05 MED ORDER — SODIUM CHLORIDE 0.9% FLUSH
10.0000 mL | INTRAVENOUS | Status: DC | PRN
  Administered 2020-11-05: 10 mL
  Filled 2020-11-05: qty 10

## 2020-11-05 MED ORDER — ACETAMINOPHEN 325 MG PO TABS
650.0000 mg | ORAL_TABLET | Freq: Once | ORAL | Status: AC
  Administered 2020-11-05: 650 mg via ORAL

## 2020-11-05 MED ORDER — MAGNESIUM SULFATE 2 GM/50ML IV SOLN
2.0000 g | Freq: Once | INTRAVENOUS | Status: AC
  Administered 2020-11-05: 2 g via INTRAVENOUS
  Filled 2020-11-05: qty 50

## 2020-11-05 MED ORDER — SODIUM CHLORIDE 0.9 % IV SOLN
Freq: Once | INTRAVENOUS | Status: AC
  Filled 2020-11-05: qty 250

## 2020-11-05 NOTE — Patient Instructions (Signed)
[facility]  Discharge Instructions: Thank you for choosing [facility] to provide your oncology and hematology care.   If you have a lab appointment with the [facility], please go directly to the [facility] and check in at the registration area.  Wear comfortable clothing and clothing appropriate for easy access to any Portacath or PICC line.   We strive to give you quality time with your provider. You may need to reschedule your appointment if you arrive late (15 or more minutes).  Arriving late affects you and other patients whose appointments are after yours.  Also, if you miss three or more appointments without notifying the office, you may be dismissed from the clinic at the provider's discretion.      For prescription refill requests, have your pharmacy contact our office and allow 72 hours for refills to be completed.    Today you received the following chemotherapy and/or immunotherapy agents camptosar, cisplatin      To help prevent nausea and vomiting after your treatment, we encourage you to take your nausea medication as directed.  BELOW ARE SYMPTOMS THAT SHOULD BE REPORTED IMMEDIATELY: . *FEVER GREATER THAN 100.4 F (38 C) OR HIGHER . *CHILLS OR SWEATING . *NAUSEA AND VOMITING THAT IS NOT CONTROLLED WITH YOUR NAUSEA MEDICATION . *UNUSUAL SHORTNESS OF BREATH . *UNUSUAL BRUISING OR BLEEDING . *URINARY PROBLEMS (pain or burning when urinating, or frequent urination) . *BOWEL PROBLEMS (unusual diarrhea, constipation, pain near the anus) . TENDERNESS IN MOUTH AND THROAT WITH OR WITHOUT PRESENCE OF ULCERS (sore throat, sores in mouth, or a toothache) . UNUSUAL RASH, SWELLING OR PAIN  . UNUSUAL VAGINAL DISCHARGE OR ITCHING   Items with * indicate a potential emergency and should be followed up as soon as possible or go to the Emergency Department if any problems should occur.  Please show the CHEMOTHERAPY ALERT CARD or IMMUNOTHERAPY  ALERT CARD at check-in to the Emergency Department and triage nurse. Should you have questions after your visit or need to cancel or reschedule your appointment, please contact [facility]  [phone] and follow the prompts.  Office hours are 8:00 a.m. to 4:30 p.m. Monday - Friday. Please note that voicemails left after 4:00 p.m. may not be returned until the following business day.  We are closed weekends and major holidays. You have access to a nurse at all times for urgent questions. Please call the main number to the clinic [phone] and follow the prompts.  For any non-urgent questions, you may also contact your provider using MyChart. We now offer e-Visits for anyone 27 and older to request care online for non-urgent symptoms. For details visit [email or web address].   Also download the MyChart app! Go to the app store, search "MyChart", open the app, select [facility], and log in with your MyChart username and password.  Due to Covid, a mask is required upon entering the hospital/clinic. If you do not have a mask, one will be given to you upon arrival. For doctor visits, patients may have 1 support person aged 12 or older with them. For treatment visits, patients cannot have anyone with them due to current Covid guidelines and our immunocompromised population. Irinotecan injection What is this medicine? IRINOTECAN (ir in oh TEE kan ) is a chemotherapy drug. It is used to treat colon and rectal cancer. This medicine may be used for other purposes; ask your health care provider or pharmacist if you have  questions. COMMON BRAND NAME(S): Camptosar What should I tell my health care provider before I take this medicine? They need to know if you have any of these conditions:  dehydration  diarrhea  infection (especially a virus infection such as chickenpox, cold sores, or herpes)  liver disease  low blood counts, like low white cell, platelet, or red cell  counts  low levels of calcium, magnesium, or potassium in the blood  recent or ongoing radiation therapy  an unusual or allergic reaction to irinotecan, other medicines, foods, dyes, or preservatives  pregnant or trying to get pregnant  breast-feeding How should I use this medicine? This drug is given as an infusion into a vein. It is administered in a hospital or clinic by a specially trained health care professional. Talk to your pediatrician regarding the use of this medicine in children. Special care may be needed. Overdosage: If you think you have taken too much of this medicine contact a poison control center or emergency room at once. NOTE: This medicine is only for you. Do not share this medicine with others. What if I miss a dose? It is important not to miss your dose. Call your doctor or health care professional if you are unable to keep an appointment. What may interact with this medicine? Do not take this medicine with any of the following medications:  cobicistat  itraconazole This medicine may interact with the following medications:  antiviral medicines for HIV or AIDS  certain antibiotics like rifampin or rifabutin  certain medicines for fungal infections like ketoconazole, posaconazole, and voriconazole  certain medicines for seizures like carbamazepine, phenobarbital, phenotoin  clarithromycin  gemfibrozil  nefazodone  St. John's Wort This list may not describe all possible interactions. Give your health care provider a list of all the medicines, herbs, non-prescription drugs, or dietary supplements you use. Also tell them if you smoke, drink alcohol, or use illegal drugs. Some items may interact with your medicine. What should I watch for while using this medicine? Your condition will be monitored carefully while you are receiving this medicine. You will need important blood work done while you are taking this medicine. This drug may make you feel  generally unwell. This is not uncommon, as chemotherapy can affect healthy cells as well as cancer cells. Report any side effects. Continue your course of treatment even though you feel ill unless your doctor tells you to stop. In some cases, you may be given additional medicines to help with side effects. Follow all directions for their use. You may get drowsy or dizzy. Do not drive, use machinery, or do anything that needs mental alertness until you know how this medicine affects you. Do not stand or sit up quickly, especially if you are an older patient. This reduces the risk of dizzy or fainting spells. Call your health care professional for advice if you get a fever, chills, or sore throat, or other symptoms of a cold or flu. Do not treat yourself. This medicine decreases your body's ability to fight infections. Try to avoid being around people who are sick. Avoid taking products that contain aspirin, acetaminophen, ibuprofen, naproxen, or ketoprofen unless instructed by your doctor. These medicines may hide a fever. This medicine may increase your risk to bruise or bleed. Call your doctor or health care professional if you notice any unusual bleeding. Be careful brushing and flossing your teeth or using a toothpick because you may get an infection or bleed more easily. If you have any dental  work done, tell your dentist you are receiving this medicine. Do not become pregnant while taking this medicine or for 6 months after stopping it. Women should inform their health care professional if they wish to become pregnant or think they might be pregnant. Men should not father a child while taking this medicine and for 3 months after stopping it. There is potential for serious side effects to an unborn child. Talk to your health care professional for more information. Do not breast-feed an infant while taking this medicine or for 7 days after stopping it. This medicine has caused ovarian failure in some  women. This medicine may make it more difficult to get pregnant. Talk to your health care professional if you are concerned about your fertility. This medicine has caused decreased sperm counts in some men. This may make it more difficult to father a child. Talk to your health care professional if you are concerned about your fertility. What side effects may I notice from receiving this medicine? Side effects that you should report to your doctor or health care professional as soon as possible:  allergic reactions like skin rash, itching or hives, swelling of the face, lips, or tongue  chest pain  diarrhea  flushing, runny nose, sweating during infusion  low blood counts - this medicine may decrease the number of white blood cells, red blood cells and platelets. You may be at increased risk for infections and bleeding.  nausea, vomiting  pain, swelling, warmth in the leg  signs of decreased platelets or bleeding - bruising, pinpoint red spots on the skin, black, tarry stools, blood in the urine  signs of infection - fever or chills, cough, sore throat, pain or difficulty passing urine  signs of decreased red blood cells - unusually weak or tired, fainting spells, lightheadedness Side effects that usually do not require medical attention (report to your doctor or health care professional if they continue or are bothersome):  constipation  hair loss  headache  loss of appetite  mouth sores  stomach pain This list may not describe all possible side effects. Call your doctor for medical advice about side effects. You may report side effects to FDA at 1-800-FDA-1088. Where should I keep my medicine? This drug is given in a hospital or clinic and will not be stored at home. NOTE: This sheet is a summary. It may not cover all possible information. If you have questions about this medicine, talk to your doctor, pharmacist, or health care provider.  2021 Elsevier/Gold Standard  (2019-04-23 17:46:13)

## 2020-11-05 NOTE — Progress Notes (Signed)
Patient has had weight loss. Will decrease doses to correspond with patient's current weight/BSA per Dr. Antonieta Pert instructions.

## 2020-11-05 NOTE — Progress Notes (Signed)
Per dr Marin Olp, okay to treat today despite labs.

## 2020-11-05 NOTE — Progress Notes (Signed)
Visited with patient in the treatment room. He c/o feeling fatigued and wore out. He had to come in today in a wheelchair as walking is difficult for him. He denies any pain, altered sensation or injuries to his legs, just states he is so weak, he's unable to confidently ambulate. He states he's drinking well, but his appetite is reduced and he's not eating the way he thinks he should be.   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

## 2020-11-05 NOTE — Patient Instructions (Signed)

## 2020-11-09 ENCOUNTER — Other Ambulatory Visit: Payer: Self-pay

## 2020-11-09 ENCOUNTER — Emergency Department (HOSPITAL_COMMUNITY): Payer: Medicare Other

## 2020-11-09 ENCOUNTER — Encounter: Payer: Self-pay | Admitting: *Deleted

## 2020-11-09 ENCOUNTER — Emergency Department (HOSPITAL_COMMUNITY)
Admission: EM | Admit: 2020-11-09 | Discharge: 2020-11-09 | Disposition: A | Discharge status: Home / Self Care | Payer: Medicare Other | Attending: Emergency Medicine | Admitting: Emergency Medicine

## 2020-11-09 DIAGNOSIS — Z85820 Personal history of malignant melanoma of skin: Secondary | ICD-10-CM | POA: Diagnosis not present

## 2020-11-09 DIAGNOSIS — I700 Atherosclerosis of aorta: Secondary | ICD-10-CM | POA: Diagnosis not present

## 2020-11-09 DIAGNOSIS — R350 Frequency of micturition: Secondary | ICD-10-CM | POA: Insufficient documentation

## 2020-11-09 DIAGNOSIS — Z85118 Personal history of other malignant neoplasm of bronchus and lung: Secondary | ICD-10-CM | POA: Insufficient documentation

## 2020-11-09 DIAGNOSIS — R262 Difficulty in walking, not elsewhere classified: Secondary | ICD-10-CM | POA: Diagnosis not present

## 2020-11-09 DIAGNOSIS — R531 Weakness: Secondary | ICD-10-CM | POA: Diagnosis not present

## 2020-11-09 DIAGNOSIS — I10 Essential (primary) hypertension: Secondary | ICD-10-CM | POA: Insufficient documentation

## 2020-11-09 DIAGNOSIS — C3490 Malignant neoplasm of unspecified part of unspecified bronchus or lung: Secondary | ICD-10-CM | POA: Diagnosis not present

## 2020-11-09 DIAGNOSIS — D649 Anemia, unspecified: Secondary | ICD-10-CM | POA: Insufficient documentation

## 2020-11-09 DIAGNOSIS — Z9861 Coronary angioplasty status: Secondary | ICD-10-CM | POA: Insufficient documentation

## 2020-11-09 DIAGNOSIS — E860 Dehydration: Secondary | ICD-10-CM | POA: Diagnosis not present

## 2020-11-09 DIAGNOSIS — R42 Dizziness and giddiness: Secondary | ICD-10-CM | POA: Diagnosis not present

## 2020-11-09 DIAGNOSIS — Z79899 Other long term (current) drug therapy: Secondary | ICD-10-CM | POA: Insufficient documentation

## 2020-11-09 DIAGNOSIS — Z87891 Personal history of nicotine dependence: Secondary | ICD-10-CM | POA: Insufficient documentation

## 2020-11-09 DIAGNOSIS — R569 Unspecified convulsions: Secondary | ICD-10-CM | POA: Diagnosis not present

## 2020-11-09 LAB — COMPREHENSIVE METABOLIC PANEL
ALT: 13 U/L (ref 0–44)
AST: 14 U/L — ABNORMAL LOW (ref 15–41)
Albumin: 3.3 g/dL — ABNORMAL LOW (ref 3.5–5.0)
Alkaline Phosphatase: 49 U/L (ref 38–126)
Anion gap: 6 (ref 5–15)
BUN: 16 mg/dL (ref 8–23)
CO2: 25 mmol/L (ref 22–32)
Calcium: 8.4 mg/dL — ABNORMAL LOW (ref 8.9–10.3)
Chloride: 102 mmol/L (ref 98–111)
Creatinine, Ser: 1.54 mg/dL — ABNORMAL HIGH (ref 0.61–1.24)
GFR, Estimated: 48 mL/min — ABNORMAL LOW (ref >60)
Glucose, Bld: 97 mg/dL (ref 70–99)
Potassium: 3.9 mmol/L (ref 3.5–5.1)
Sodium: 133 mmol/L — ABNORMAL LOW (ref 135–145)
Total Bilirubin: 0.5 mg/dL (ref 0.3–1.2)
Total Protein: 5.9 g/dL — ABNORMAL LOW (ref 6.5–8.1)

## 2020-11-09 LAB — URINALYSIS, ROUTINE W REFLEX MICROSCOPIC
Bilirubin Urine: NEGATIVE
Glucose, UA: NEGATIVE mg/dL
Hgb urine dipstick: NEGATIVE
Ketones, ur: NEGATIVE mg/dL
Leukocytes,Ua: NEGATIVE
Nitrite: NEGATIVE
Protein, ur: NEGATIVE mg/dL
Specific Gravity, Urine: 1.01 (ref 1.005–1.030)
pH: 5 (ref 5.0–8.0)

## 2020-11-09 LAB — CBC WITH DIFFERENTIAL/PLATELET
Abs Immature Granulocytes: 0.09 10*3/uL — ABNORMAL HIGH (ref 0.00–0.07)
Basophils Absolute: 0 10*3/uL (ref 0.0–0.1)
Basophils Relative: 0 %
Eosinophils Absolute: 0 10*3/uL (ref 0.0–0.5)
Eosinophils Relative: 0 %
HCT: 27 % — ABNORMAL LOW (ref 39.0–52.0)
Hemoglobin: 9.3 g/dL — ABNORMAL LOW (ref 13.0–17.0)
Immature Granulocytes: 2 %
Lymphocytes Relative: 32 %
Lymphs Abs: 1.6 10*3/uL (ref 0.7–4.0)
MCH: 33.9 pg (ref 26.0–34.0)
MCHC: 34.4 g/dL (ref 30.0–36.0)
MCV: 98.5 fL (ref 80.0–100.0)
Monocytes Absolute: 0.3 10*3/uL (ref 0.1–1.0)
Monocytes Relative: 6 %
Neutro Abs: 3 10*3/uL (ref 1.7–7.7)
Neutrophils Relative %: 60 %
Platelets: 82 10*3/uL — ABNORMAL LOW (ref 150–400)
RBC: 2.74 MIL/uL — ABNORMAL LOW (ref 4.22–5.81)
RDW: 18.2 % — ABNORMAL HIGH (ref 11.5–15.5)
WBC: 5 10*3/uL (ref 4.0–10.5)
nRBC: 0 % (ref 0.0–0.2)

## 2020-11-09 IMAGING — CT CT HEAD W/O CM
4 series · 16 of 47 positions shown, 18 images · non-contrast
Comparison: [DATE] head CT.

CLINICAL DATA: Generalized weakness for 1 month with dizziness.
Increased urination at night. No reported injury. History of
melanoma metastatic to the brain.

EXAM:
CT HEAD WITHOUT CONTRAST
TECHNIQUE: Contiguous axial images were obtained from the base of the skull
through the vertex without intravenous contrast.

[Series 2: head bone · axial · 0.42mm/px · z∈[-139,-107]mm · 3 of 83 slices shown]
[im 9/83  bone]
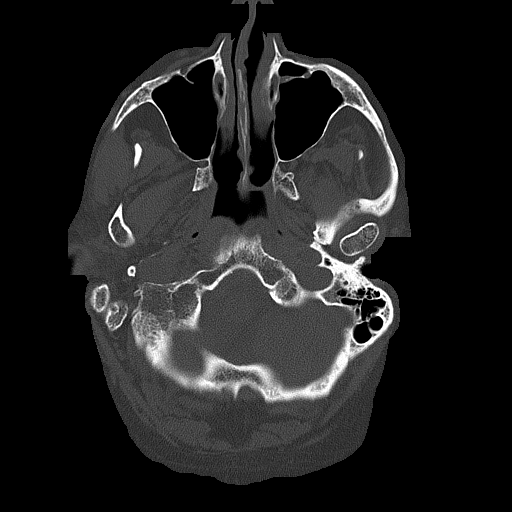
[im 17/83  bone]
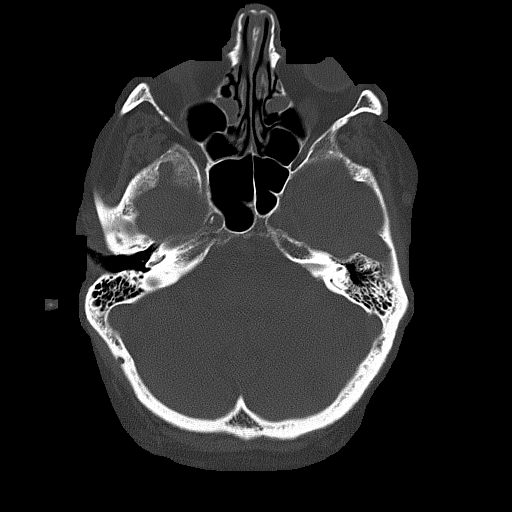
[im 25/83  bone]
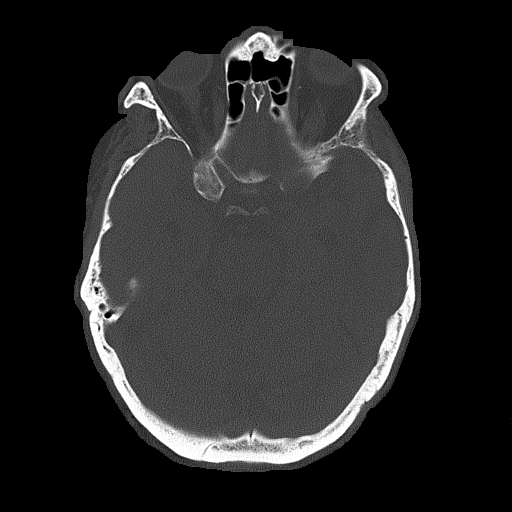

[Series 3: head wo · axial · 0.42mm/px · z∈[-135,-15]mm · 7 of 33 slices shown, 9 images]
[im 5/33  brain]
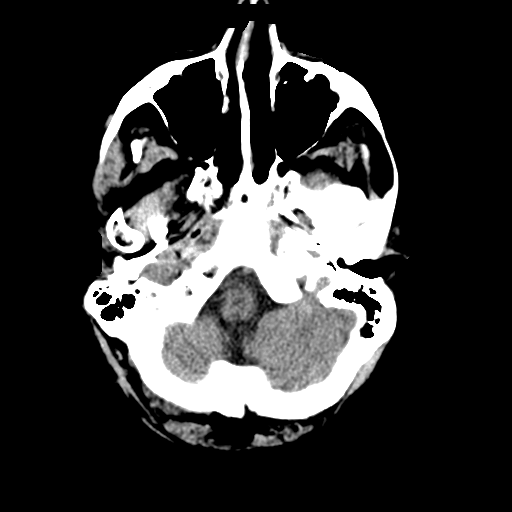
[im 5/33  bone]
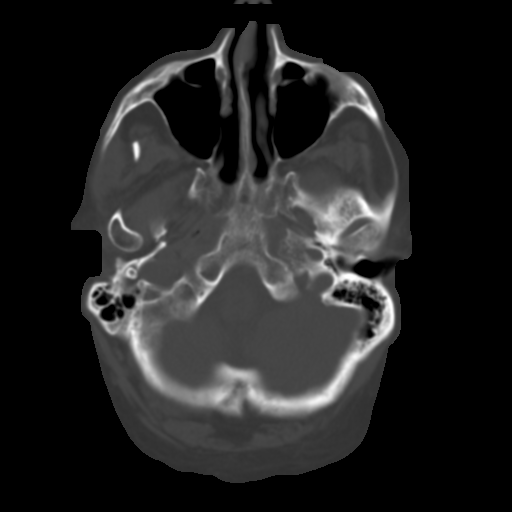
[im 9/33  brain]
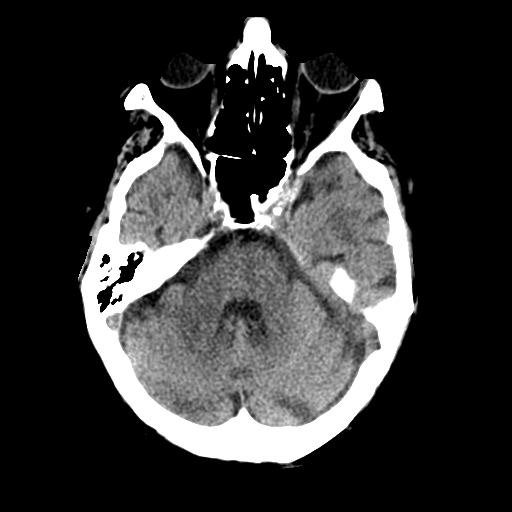
[im 13/33  brain]
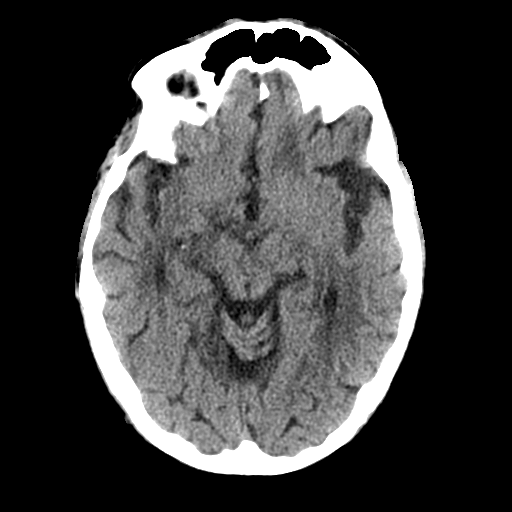
[im 17/33  brain]
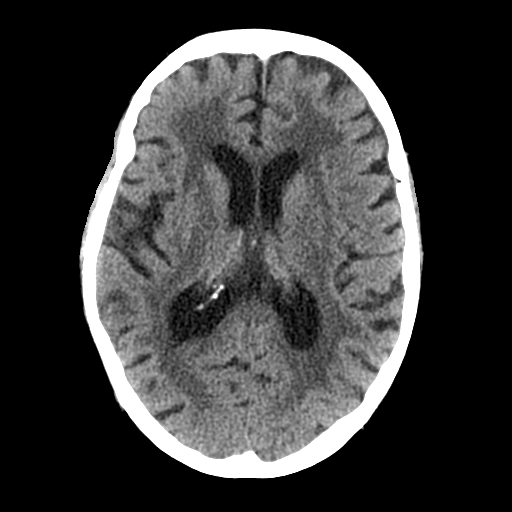
[im 21/33  brain]
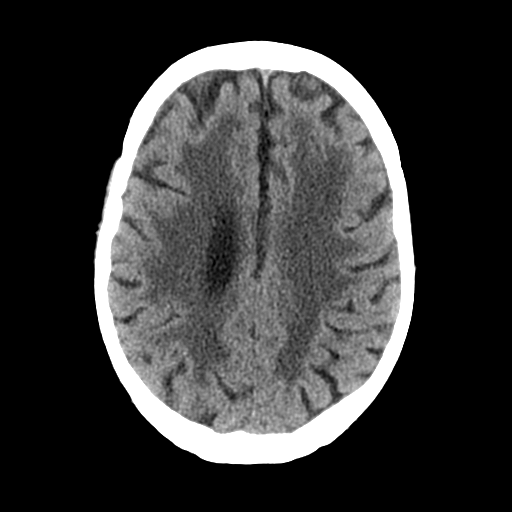
[im 21/33  bone]
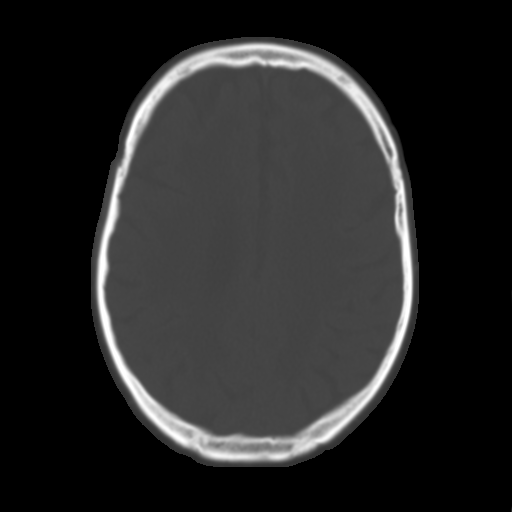
[im 25/33  brain]
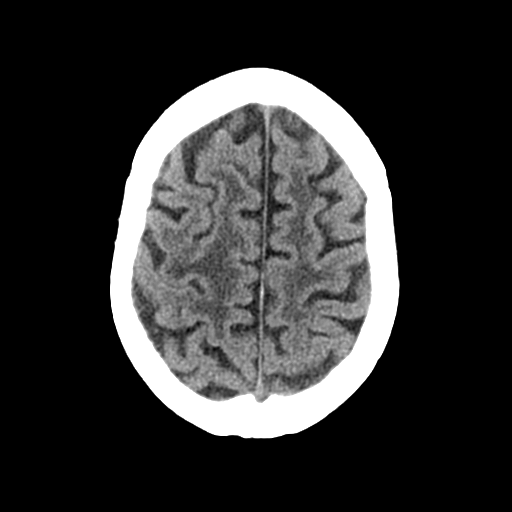
[im 29/33  brain]
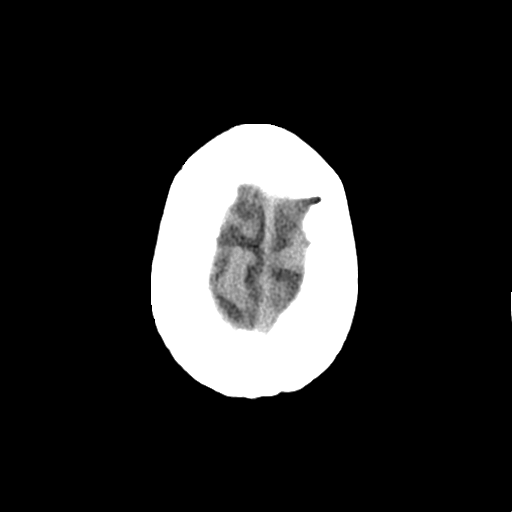

[Series 5: coronal soft tissue · coronal · 0.32mm/px · 3 of 67 slices shown]
[im 30/67  brain]
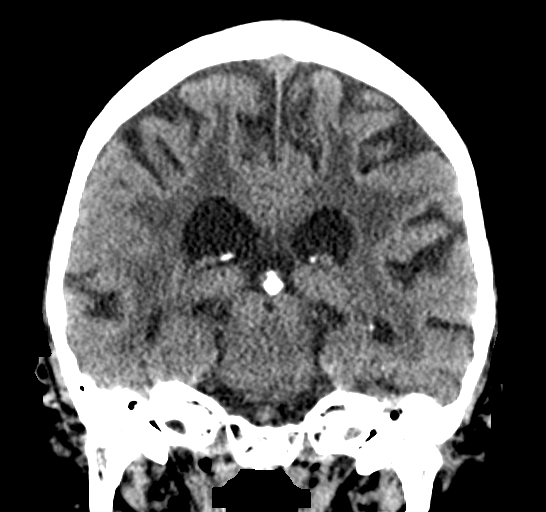
[im 37/67  brain]
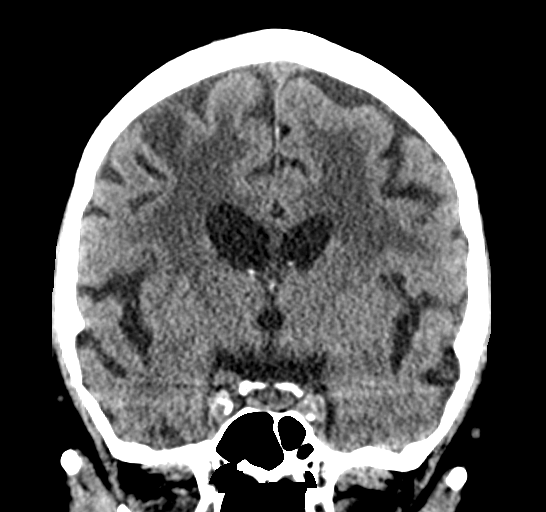
[im 45/67  brain]
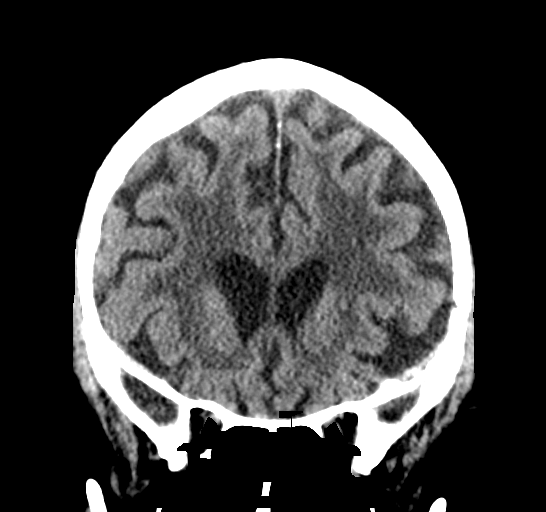

[Series 6: sagittal soft tissue · sagittal · 0.32mm/px · 3 of 57 slices shown]
[im 19/57  brain]
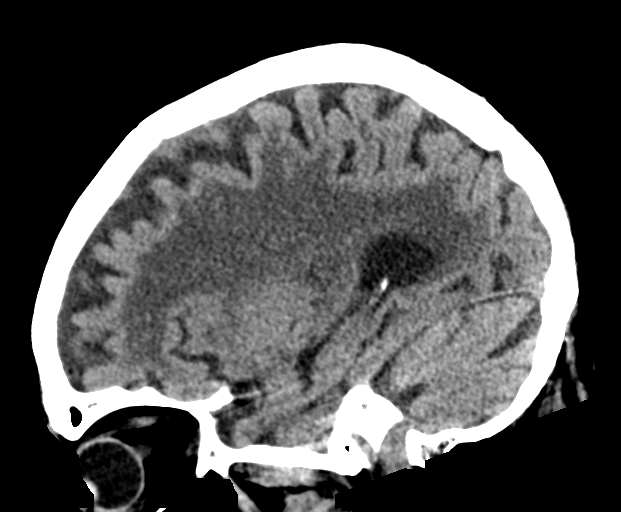
[im 29/57  brain]
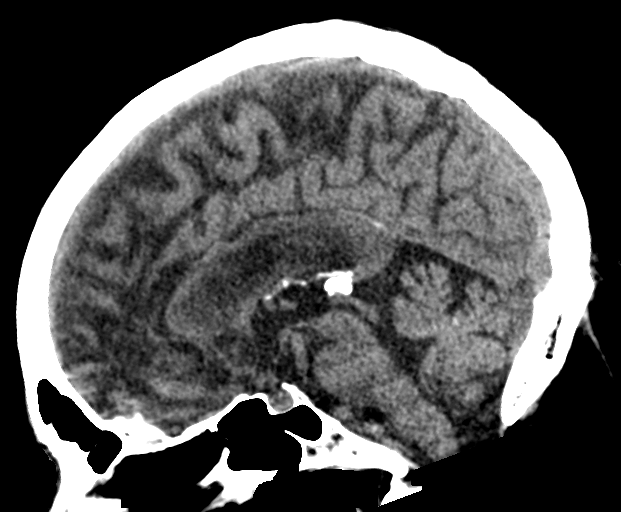
[im 38/57  brain]
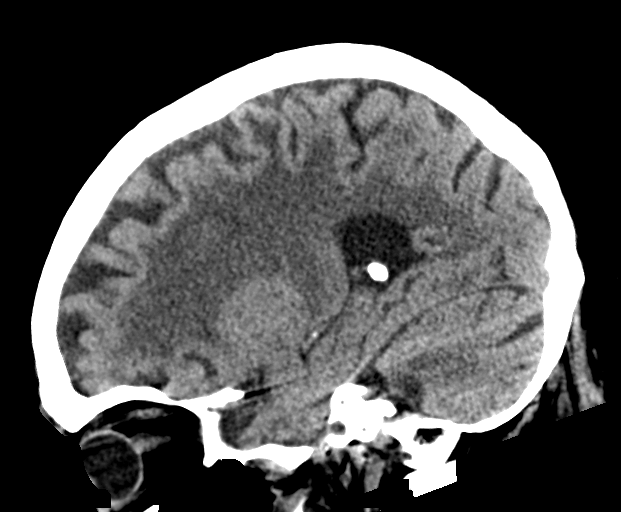

[16 of 47 positions shown; findings below may reference images not displayed]

FINDINGS: Brain: Generalized cerebral volume loss. Nonspecific prominent
subcortical and periventricular white matter hypodensity, most in
keeping with chronic small vessel ischemic change. No evidence of
parenchymal hemorrhage or extra-axial fluid collection. No mass
lesion, mass effect, or midline shift. No CT evidence of acute
infarction. No ventriculomegaly.

Vascular: No acute abnormality.

Skull: No evidence of calvarial fracture.

Sinuses/Orbits: No fluid levels. Mild patchy opacification of the
ethmoidal air cells.

Other:  The mastoid air cells are unopacified.
IMPRESSION: 1. No evidence of acute intracranial abnormality.
2. Generalized cerebral volume loss and prominent chronic small
vessel ischemic changes in the cerebral white matter.
3. Two known brain metastases seen on [DATE] MRI are occult by
today's noncontrast CT.

## 2020-11-09 IMAGING — CR DG CHEST 2V
2 series · 2 of 2 positions shown · non-contrast
Comparison: PET-CT [DATE]

CLINICAL DATA: Weakness and dizziness.  Small cell lung cancer.

EXAM:
CHEST - 2 VIEW

[w chest lat]
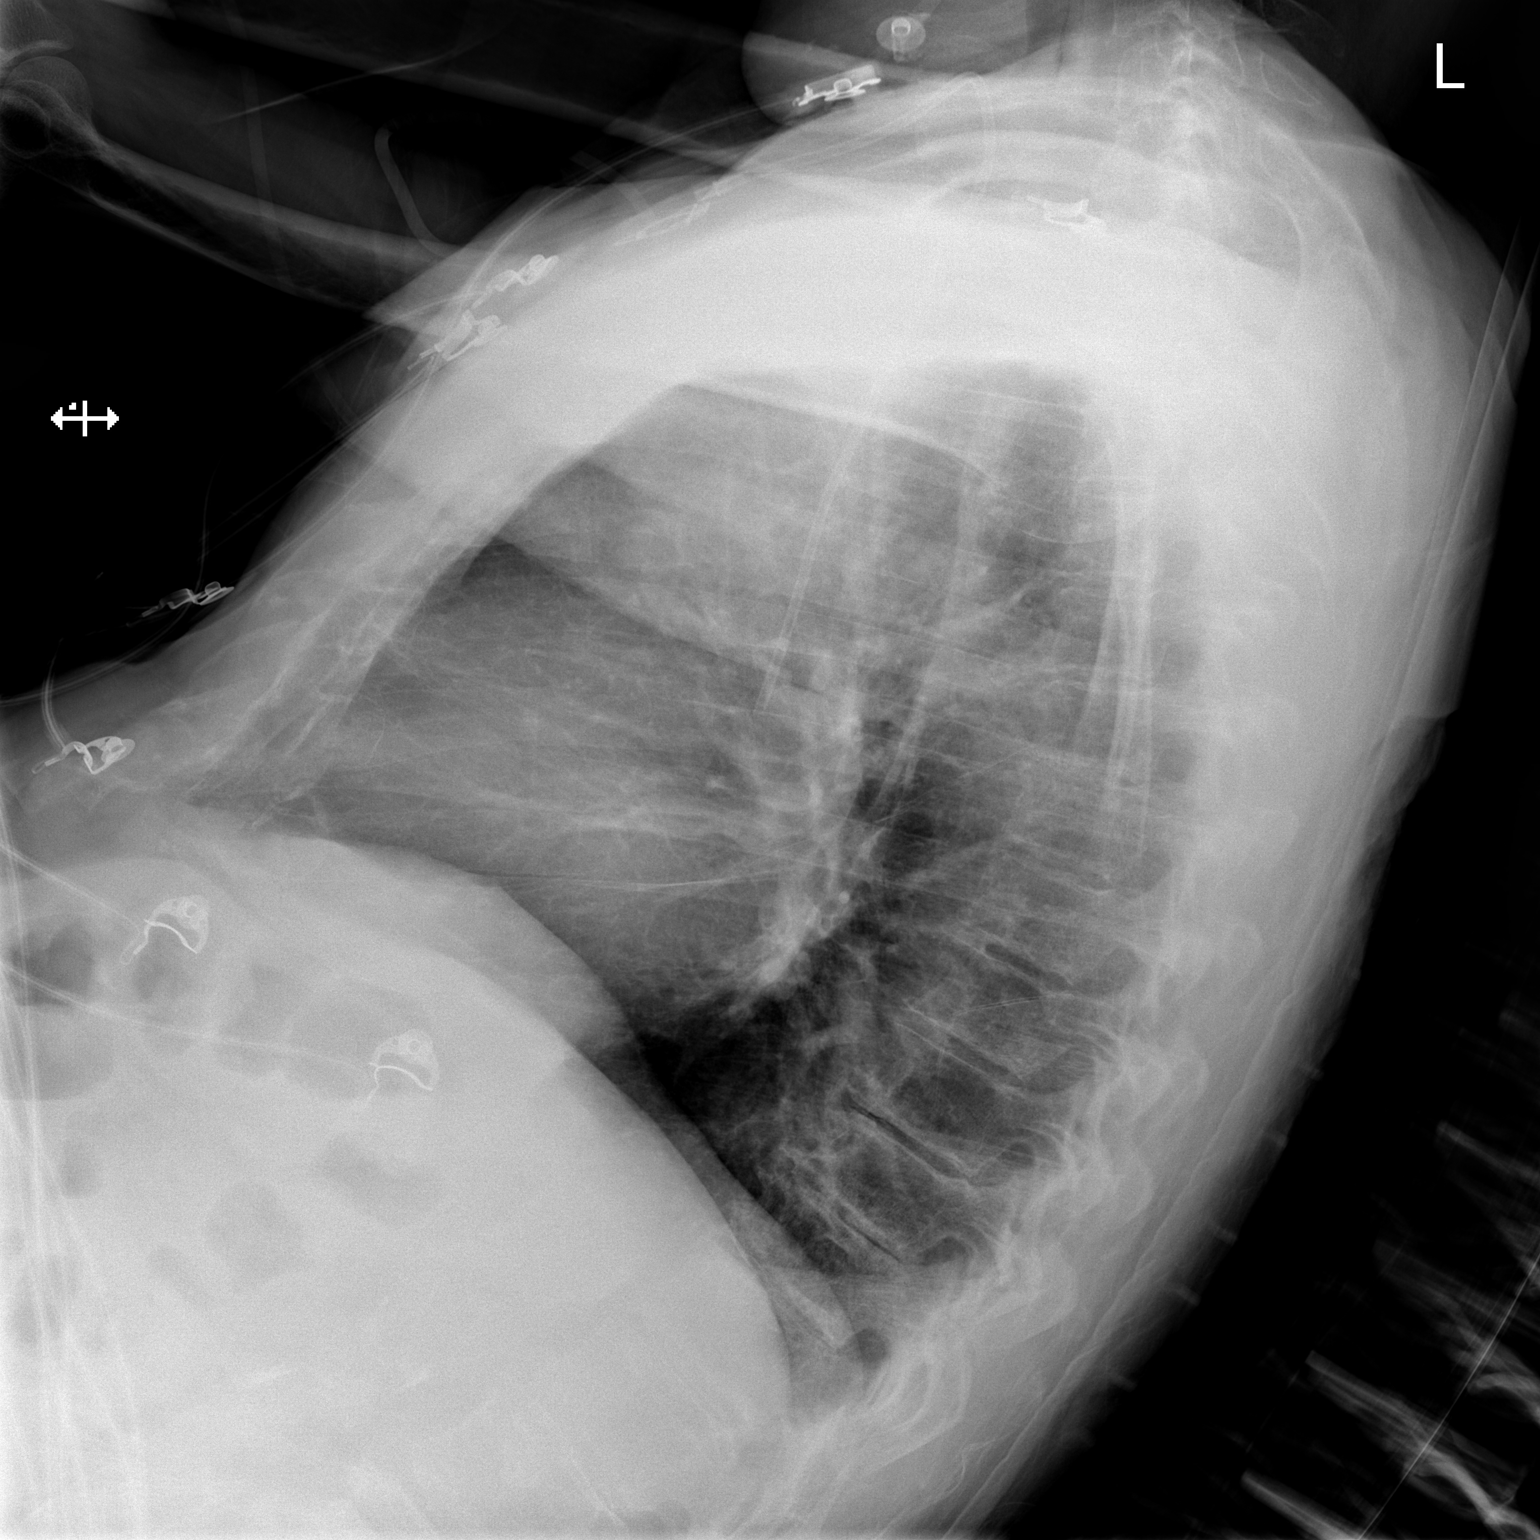

[x chest ap]
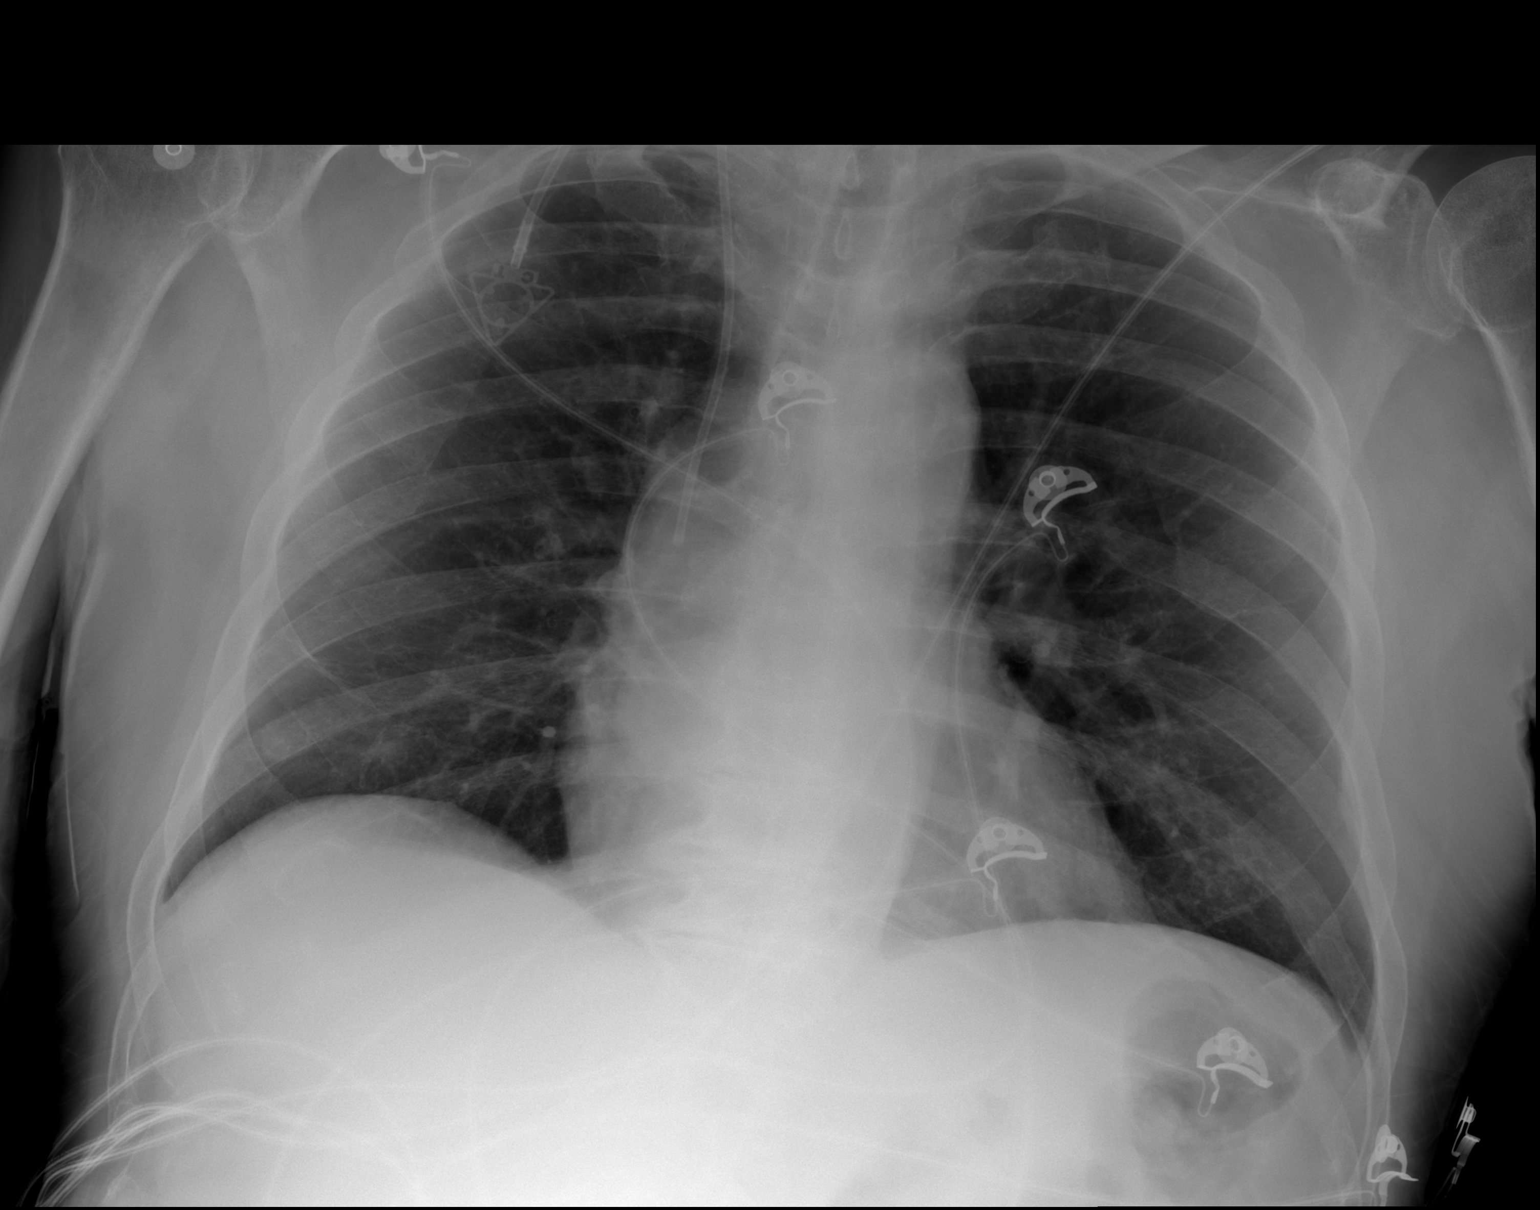

[2 of 2 positions shown; findings below may reference images not displayed]

FINDINGS: Power injectable Port-A-Cath tip: SVC. Symmetric nodularity at the
lung bases ascribed nipple shadows. Thoracic spondylosis. Heart size
within normal limits. Atherosclerotic calcification of the aortic
arch. No blunting of the costophrenic angles.
IMPRESSION: 1.  No active cardiopulmonary disease is radiographically apparent.
2.  Aortic Atherosclerosis ([LO]-[LO]).
3. Thoracic spondylosis.

## 2020-11-09 MED ORDER — SODIUM CHLORIDE 0.9 % IV BOLUS
1000.0000 mL | Freq: Once | INTRAVENOUS | Status: AC
  Administered 2020-11-09: 1000 mL via INTRAVENOUS

## 2020-11-09 NOTE — ED Provider Notes (Signed)
[place] DEPT Provider Note   CSN: [id number] Arrival date & time: 11/09/20  1613     History Chief Complaint  Patient presents with  . Weakness    Walter Hernandez is a 70 y.o. male.  Pt presents to the ED today with weakness.  Pt said he's had weakness for 1 month with dizziness with standing and frequent urination at night.  He does have a hx of stage 1b malignant melanoma to the left leg as well as extensive stage small cell lung cancer.  He did get 2 units of blood on 5/18 and he had his last chemo on 6/2.  The oncology nurse navigator spoke with pt's wife today:  "Spoke with patient's wife, Colletta Maryland. Reviewed lab results from last week. Nothing suggests an alternate cause to patient's anemia. It is likely from mylosuppression from his treatments. Colletta Maryland expresses some relief.  Patient is doing okay after his chemo last week. He continues to be fatigued and weak. She believes his appetite to be good and his intake is stable. "  Pt could not tell me what made him decide to call EMS today.        Past Medical History:  Diagnosis Date  . Cancer [facility])    recently discovered melanoma  . GERD (gastroesophageal reflux disease)   . Goals of care, counseling/discussion 06/19/2019  . History of hiatal hernia   . Hypertension    recently placed on new blood pressure med  . Malignant melanoma of skin of left lower leg ([place]) 07/28/2015    Patient Active Problem List   Diagnosis Date Noted  . Goals of care, counseling/discussion 06/19/2019  . Extensive stage primary small cell carcinoma of lung ([place]) 06/11/2019  . Brain metastases ([place]) 06/03/2019  . Essential hypertension 06/03/2019  . GERD (gastroesophageal reflux disease) 06/03/2019  . Malignant melanoma of skin of left lower leg ([place]) 07/28/2015    Past Surgical History:  Procedure Laterality Date  . BRONCHIAL NEEDLE ASPIRATION BIOPSY  06/05/2019   Procedure: Bronchial Needle Aspiration Biopsies;   Surgeon: Candee Furbish, MD;  Location: [place];  Service: Thoracic;;  . CARDIAC CATHETERIZATION     15 yrs ago...came out normal   -- hiatal hernia  . COLONOSCOPY  2012   Dr Earlean Shawl   . ENDOBRONCHIAL ULTRASOUND N/A 06/11/2019   Procedure: ENDOBRONCHIAL ULTRASOUND;  Surgeon: Garner Nash, DO;  Location: [place];  Service: Endoscopy;  Laterality: N/A;  . ESOPHAGOGASTRODUODENOSCOPY  2012   Dr Earlean Shawl. has a really bad hiatal hernia   . FINE NEEDLE ASPIRATION  06/11/2019   Procedure: FINE NEEDLE ASPIRATION (FNA) LINEAR;  Surgeon: Garner Nash, DO;  Location: [place];  Service: Endoscopy;;  . IR IMAGING GUIDED PORT INSERTION  07/03/2019  . MELANOMA EXCISION WITH SENTINEL LYMPH NODE BIOPSY Left 09/29/2014   Procedure: WIDE EXCISION MELANOMA LEFT CALF WITH LEFT INGUINAL  SENTINEL LYMPH NODE BIOPSY;  Surgeon: Georganna Skeans, MD;  Location: [place];  Service: General;  Laterality: Left;  . skin lesion excised     non cancerous  . VIDEO BRONCHOSCOPY N/A 06/11/2019   Procedure: VIDEO BRONCHOSCOPY WITHOUT FLUORO;  Surgeon: Garner Nash, DO;  Location: [place];  Service: Endoscopy;  Laterality: N/A;  . VIDEO BRONCHOSCOPY WITH ENDOBRONCHIAL ULTRASOUND N/A 06/05/2019   Procedure: VIDEO BRONCHOSCOPY WITH ENDOBRONCHIAL ULTRASOUND;  Surgeon: Candee Furbish, MD;  Location: [facility];  Service: Thoracic;  Laterality: N/A;       Family History  Problem Relation  Age of Onset  . Colon cancer Neg Hx   . Esophageal cancer Neg Hx     Social History   Tobacco Use  . Smoking status: Former Smoker    Packs/day: 1.00    Years: 50.00    Pack years: 50.00    Types: Cigarettes  . Smokeless tobacco: Never Used  . Tobacco comment: quit  06/03/2019  Vaping Use  . Vaping Use: Never used  Substance Use Topics  . Alcohol use: Not Currently    Alcohol/week: 0.0 standard drinks    Comment: socially....beer  wine & liquor  . Drug use: No    Home Medications Prior to Admission medications    Medication Sig Start Date End Date Taking? Authorizing Provider  acetaminophen (TYLENOL) 500 MG tablet Take 1,000 mg by mouth every 6 (six) hours as needed for moderate pain.    [provider]  ALPRAZolam Duanne Moron) 0.5 MG tablet Take 1 tablet (0.5 mg total) by mouth every 6 (six) hours as needed for anxiety. 06/10/19   Volanda Napoleon, MD  dexamethasone (DECADRON) 4 MG tablet Dexamethasone 4 mg TID x 2 days, BID x 2 days, then 2 mg (1/2 tab) BID x 7 days, then 2 mg (1/2 tab) QD x 7 days then stop. 07/27/20   Hayden Pedro, PA-C  dexamethasone (DECADRON) 4 MG tablet Take 2 tablets (8 mg total) by mouth 2 (two) times daily. You must take with food in the morning and at dinner. 09/22/20   Volanda Napoleon, MD  diphenoxylate-atropine (LOMOTIL) 2.5-0.025 MG tablet Take 2 tablets at onset of diarrhea. Take one tablet after each loose stool. Max 8 tablets per day. 05/12/20   Volanda Napoleon, MD  dronabinol (MARINOL) 5 MG capsule Take 1 capsule (5 mg total) by mouth 2 (two) times daily before a meal. 09/10/19   [place], Holli Humbles, NP  finasteride (PROSCAR) 5 MG tablet TAKE 1 TABLET BY MOUTH EVERY DAY 09/07/20   Volanda Napoleon, MD  furosemide (LASIX) 20 MG tablet Take 2 tablets (40 mg total) by mouth daily. 08/01/19   Volanda Napoleon, MD  levETIRAcetam (KEPPRA) 500 MG tablet Take 1 tablet (500 mg total) by mouth 2 (two) times daily. 09/05/20   Luna Fuse, MD  levETIRAcetam (KEPPRA) 500 MG tablet Take 1 tablet (500 mg total) by mouth 2 (two) times daily. 09/22/20   Volanda Napoleon, MD  lidocaine-prilocaine (EMLA) cream Apply to affected area once 03/23/20   Volanda Napoleon, MD  loperamide (IMODIUM) 2 MG capsule  03/24/20   [provider]  LORazepam (ATIVAN) 0.5 MG tablet Take 1 tablet (0.5 mg total) by mouth every 6 (six) hours as needed (Nausea or vomiting). 03/24/20   [place], Holli Humbles, NP  Multiple Vitamin (MULTIVITAMIN) capsule Take 1 capsule by mouth daily.    [provider]  Naphazoline-Pheniramine (OPCON-A OP) Place 2 drops into both eyes as needed (for dry eyes).    [provider]  omeprazole (PRILOSEC) 40 MG capsule Take 1 capsule (40 mg total) by mouth 2 (two) times daily. Please call [phone] to schedule an office visit for more refills. 10/21/20   Jackquline Denmark, MD  ondansetron (ZOFRAN) 4 MG tablet Take 1 tablet (4 mg total) by mouth every 6 (six) hours as needed for nausea. 06/05/19   Little Ishikawa, MD  ondansetron (ZOFRAN) 8 MG tablet Take 1 tablet (8 mg total) by mouth 2 (two) times daily as needed. Start on the third  day after cisplatin chemotherapy. 03/23/20   Volanda Napoleon, MD  ondansetron (ZOFRAN-ODT) 8 MG disintegrating tablet Take 8 mg by mouth.    [provider]  prochlorperazine (COMPAZINE) 10 MG tablet Take 1 tablet (10 mg total) by mouth every 6 (six) hours as needed (Nausea or vomiting). 03/23/20   Volanda Napoleon, MD  tamsulosin (FLOMAX) 0.4 MG CAPS capsule Take 1 capsule (0.4 mg total) by mouth daily. 01/21/20   Volanda Napoleon, MD    Allergies    Patient has no known allergies.  Review of Systems   Review of Systems  Neurological: Positive for dizziness and weakness.  All other systems reviewed and are negative.   Physical Exam Updated Vital Signs BP (!) 161/94 (BP Location: Left Arm)   Pulse 72   Temp 98.1 F (36.7 C) (Oral)   Resp (!) 21   Ht 5\' 7"  (1.702 m)   Wt 68 kg   SpO2 99%   BMI 23.49 kg/m   Physical Exam Vitals and nursing note reviewed.  Constitutional:      Appearance: Normal appearance.  HENT:     Head: Normocephalic and atraumatic.     Right Ear: External ear normal.     Left Ear: External ear normal.     Nose: Nose normal.     Mouth/Throat:     Mouth: Mucous membranes are dry.     Pharynx: Oropharynx is clear.  Eyes:     Extraocular Movements: Extraocular movements intact.     Conjunctiva/sclera: Conjunctivae normal.     Pupils: Pupils are equal, round,  and reactive to light.  Cardiovascular:     Rate and Rhythm: Normal rate and regular rhythm.     Pulses: Normal pulses.     Heart sounds: Normal heart sounds.  Pulmonary:     Effort: Pulmonary effort is normal.     Breath sounds: Normal breath sounds.  Abdominal:     General: Abdomen is flat. Bowel sounds are normal.     Palpations: Abdomen is soft.  Musculoskeletal:        General: Normal range of motion.     Cervical back: Normal range of motion and neck supple.  Skin:    General: Skin is warm.     Capillary Refill: Capillary refill takes less than 2 seconds.  Neurological:     General: No focal deficit present.     Mental Status: He is alert and oriented to person, place, and time.  Psychiatric:        Mood and Affect: Mood normal.        Behavior: Behavior normal.     ED Results / Procedures / Treatments   Labs (all labs ordered are listed, but only abnormal results are displayed) Labs Reviewed  COMPREHENSIVE METABOLIC PANEL - Abnormal; Notable for the following components:      Result Value   Sodium 133 (*)    Creatinine, Ser 1.54 (*)    Calcium 8.4 (*)    Total Protein 5.9 (*)    Albumin 3.3 (*)    AST 14 (*)    GFR, Estimated 48 (*)    All other components within normal limits  CBC WITH DIFFERENTIAL/PLATELET - Abnormal; Notable for the following components:   RBC 2.74 (*)    Hemoglobin 9.3 (*)    HCT 27.0 (*)    RDW 18.2 (*)    Platelets 82 (*)    Abs Immature Granulocytes 0.09 (*)    All other  components within normal limits  URINE CULTURE  URINALYSIS, ROUTINE W REFLEX MICROSCOPIC    EKG EKG Interpretation  Date/Time:  Monday November 09 2020 16:28:53 EDT Ventricular Rate:  80 PR Interval:  212 QRS Duration: 148 QT Interval:  392 QTC Calculation: 453 R Axis:   -78 Text Interpretation: Sinus rhythm Borderline prolonged PR interval RBBB and LAFB No significant change since last tracing Confirmed by Isla Pence [id number]) on 11/09/2020 6:44:47  PM   Radiology DG Chest 2 View  Result Date: 11/09/2020 CLINICAL DATA:  Weakness and dizziness.  Small cell lung cancer. EXAM: CHEST - 2 VIEW COMPARISON:  PET-CT 10/19/2020 FINDINGS: Power injectable Port-A-Cath tip: SVC. Symmetric nodularity at the lung bases ascribed nipple shadows. Thoracic spondylosis. Heart size within normal limits. Atherosclerotic calcification of the aortic arch. No blunting of the costophrenic angles. IMPRESSION: 1.  No active cardiopulmonary disease is radiographically apparent. 2.  Aortic Atherosclerosis (ICD10-I70.0). 3. Thoracic spondylosis. Electronically Signed   By: Van Clines M.D.   On: 11/09/2020 17:42   CT Head Wo Contrast  Result Date: 11/09/2020 CLINICAL DATA:  Generalized weakness for 1 month with dizziness. Increased urination at night. No reported injury. History of melanoma metastatic to the brain. EXAM: CT HEAD WITHOUT CONTRAST TECHNIQUE: Contiguous axial images were obtained from the base of the skull through the vertex without intravenous contrast. COMPARISON:  09/05/2020 head CT. FINDINGS: Brain: Generalized cerebral volume loss. Nonspecific prominent subcortical and periventricular white matter hypodensity, most in keeping with chronic small vessel ischemic change. No evidence of parenchymal hemorrhage or extra-axial fluid collection. No mass lesion, mass effect, or midline shift. No CT evidence of acute infarction. No ventriculomegaly. Vascular: No acute abnormality. Skull: No evidence of calvarial fracture. Sinuses/Orbits: No fluid levels. Mild patchy opacification of the ethmoidal air cells. Other:  The mastoid air cells are unopacified. IMPRESSION: 1. No evidence of acute intracranial abnormality. 2. Generalized cerebral volume loss and prominent chronic small vessel ischemic changes in the cerebral white matter. 3. Two known brain metastases seen on 09/21/2020 MRI are occult by today's noncontrast CT. Electronically Signed   By: Ilona Sorrel M.D.    On: 11/09/2020 17:37    Procedures Procedures   Medications Ordered in ED Medications  sodium chloride 0.9 % bolus 1,000 mL (1,000 mLs Intravenous New Bag/Given 11/09/20 1651)    ED Course  I have reviewed the triage vital signs and the nursing notes.  Pertinent labs & imaging results that were available during my care of the patient were reviewed by me and considered in my medical decision making (see chart for details).    MDM Rules/Calculators/A&P                          Pt is feeling better after the 1L NS.  He feels back to normal.  Urine nl.  Pt wants to go home.  He is stable for d/c.  Return if worse.  Final Clinical Impression(s) / ED Diagnoses Final diagnoses:  Dehydration  Chronic anemia    Rx / DC Orders ED Discharge Orders    None       Isla Pence, MD 11/09/20 [phone]

## 2020-11-09 NOTE — ED Triage Notes (Signed)
Ems brings pt in from home for generalized weakness for about 1 month along with dizziness. Family reports increased urination at night time.

## 2020-11-09 NOTE — Progress Notes (Signed)
Spoke with patient's wife, Walter Hernandez. Reviewed lab results from last week. Nothing suggests an alternate cause to patient's anemia. It is likely from mylosuppression from his treatments. Walter Hernandez expresses some relief.  Patient is doing okay after his chemo last week. He continues to be fatigued and weak. She believes his appetite to be good and his intake is stable.   Oncology Nurse Navigator Documentation  Oncology Nurse Navigator Flowsheets 11/09/2020  Abnormal Finding Date -  Confirmed Diagnosis Date -  Diagnosis Status -  Planned Course of Treatment -  Phase of Treatment -  Chemotherapy Actual Start Date: -  Radiation Actual Start Date: -  Navigator Follow Up Date: 11/24/2020  Navigator Follow Up Reason: Follow-up Appointment;Chemotherapy  Navigator Restaurant manager, fast food Encounter Type Telephone  Telephone Patient Update;Symptom Mgt;Outgoing Call;Diagnostic Results  Treatment Initiated Date -  Patient Visit Type MedOnc  Treatment Phase Active Tx  Barriers/Navigation Needs Coordination of Care;Education  Education Pain/ Symptom Management  Interventions Education;Psycho-Social Support  Acuity Level 2-Minimal Needs (1-2 Barriers Identified)  Referrals -  Coordination of Care -  Education Method Verbal  Support Groups/Services Friends and Family  Time Spent with Patient 15

## 2020-11-10 ENCOUNTER — Other Ambulatory Visit: Payer: Self-pay | Admitting: Hematology & Oncology

## 2020-11-10 ENCOUNTER — Other Ambulatory Visit: Payer: Self-pay | Admitting: *Deleted

## 2020-11-10 DIAGNOSIS — C3490 Malignant neoplasm of unspecified part of unspecified bronchus or lung: Secondary | ICD-10-CM

## 2020-11-10 DIAGNOSIS — C7931 Secondary malignant neoplasm of brain: Secondary | ICD-10-CM

## 2020-11-10 DIAGNOSIS — D649 Anemia, unspecified: Secondary | ICD-10-CM

## 2020-11-10 DIAGNOSIS — Z95828 Presence of other vascular implants and grafts: Secondary | ICD-10-CM

## 2020-11-11 ENCOUNTER — Telehealth: Payer: Self-pay | Admitting: *Deleted

## 2020-11-11 ENCOUNTER — Inpatient Hospital Stay (HOSPITAL_BASED_OUTPATIENT_CLINIC_OR_DEPARTMENT_OTHER): Payer: Medicare Other | Admitting: Family

## 2020-11-11 ENCOUNTER — Inpatient Hospital Stay: Payer: Medicare Other

## 2020-11-11 ENCOUNTER — Encounter: Payer: Self-pay | Admitting: Family

## 2020-11-11 ENCOUNTER — Other Ambulatory Visit: Payer: Self-pay

## 2020-11-11 VITALS — BP 100/69 | HR 104 | Temp 98.2°F | Resp 18 | Wt 151.0 lb

## 2020-11-11 DIAGNOSIS — Z95828 Presence of other vascular implants and grafts: Secondary | ICD-10-CM

## 2020-11-11 DIAGNOSIS — C7931 Secondary malignant neoplasm of brain: Secondary | ICD-10-CM

## 2020-11-11 DIAGNOSIS — R197 Diarrhea, unspecified: Secondary | ICD-10-CM | POA: Diagnosis not present

## 2020-11-11 DIAGNOSIS — C4372 Malignant melanoma of left lower limb, including hip: Secondary | ICD-10-CM

## 2020-11-11 DIAGNOSIS — K521 Toxic gastroenteritis and colitis: Secondary | ICD-10-CM

## 2020-11-11 DIAGNOSIS — K591 Functional diarrhea: Secondary | ICD-10-CM

## 2020-11-11 DIAGNOSIS — C3490 Malignant neoplasm of unspecified part of unspecified bronchus or lung: Secondary | ICD-10-CM

## 2020-11-11 DIAGNOSIS — Z79899 Other long term (current) drug therapy: Secondary | ICD-10-CM

## 2020-11-11 DIAGNOSIS — R519 Headache, unspecified: Secondary | ICD-10-CM | POA: Diagnosis not present

## 2020-11-11 DIAGNOSIS — D649 Anemia, unspecified: Secondary | ICD-10-CM

## 2020-11-11 DIAGNOSIS — Z5111 Encounter for antineoplastic chemotherapy: Secondary | ICD-10-CM | POA: Diagnosis not present

## 2020-11-11 DIAGNOSIS — C787 Secondary malignant neoplasm of liver and intrahepatic bile duct: Secondary | ICD-10-CM | POA: Diagnosis not present

## 2020-11-11 DIAGNOSIS — C3431 Malignant neoplasm of lower lobe, right bronchus or lung: Secondary | ICD-10-CM | POA: Diagnosis not present

## 2020-11-11 LAB — CMP (CANCER CENTER ONLY)
ALT: 8 U/L (ref 0–44)
AST: 11 U/L — ABNORMAL LOW (ref 15–41)
Albumin: 3.8 g/dL (ref 3.5–5.0)
Alkaline Phosphatase: 58 U/L (ref 38–126)
Anion gap: 9 (ref 5–15)
BUN: 16 mg/dL (ref 8–23)
CO2: 25 mmol/L (ref 22–32)
Calcium: 9.3 mg/dL (ref 8.9–10.3)
Chloride: 102 mmol/L (ref 98–111)
Creatinine: 1.41 mg/dL — ABNORMAL HIGH (ref 0.61–1.24)
GFR, Estimated: 53 mL/min — ABNORMAL LOW (ref >60)
Glucose, Bld: 90 mg/dL (ref 70–99)
Potassium: 4.2 mmol/L (ref 3.5–5.1)
Sodium: 136 mmol/L (ref 135–145)
Total Bilirubin: 0.5 mg/dL (ref 0.3–1.2)
Total Protein: 5.9 g/dL — ABNORMAL LOW (ref 6.5–8.1)

## 2020-11-11 LAB — CBC WITH DIFFERENTIAL (CANCER CENTER ONLY)
Abs Immature Granulocytes: 0.26 10*3/uL — ABNORMAL HIGH (ref 0.00–0.07)
Basophils Absolute: 0 10*3/uL (ref 0.0–0.1)
Basophils Relative: 0 %
Eosinophils Absolute: 0 10*3/uL (ref 0.0–0.5)
Eosinophils Relative: 0 %
HCT: 25.7 % — ABNORMAL LOW (ref 39.0–52.0)
Hemoglobin: 9 g/dL — ABNORMAL LOW (ref 13.0–17.0)
Immature Granulocytes: 6 %
Lymphocytes Relative: 37 %
Lymphs Abs: 1.7 10*3/uL (ref 0.7–4.0)
MCH: 33.6 pg (ref 26.0–34.0)
MCHC: 35 g/dL (ref 30.0–36.0)
MCV: 95.9 fL (ref 80.0–100.0)
Monocytes Absolute: 0.4 10*3/uL (ref 0.1–1.0)
Monocytes Relative: 9 %
Neutro Abs: 2.2 10*3/uL (ref 1.7–7.7)
Neutrophils Relative %: 48 %
Platelet Count: 74 10*3/uL — ABNORMAL LOW (ref 150–400)
RBC: 2.68 MIL/uL — ABNORMAL LOW (ref 4.22–5.81)
RDW: 18.3 % — ABNORMAL HIGH (ref 11.5–15.5)
WBC Count: 4.6 10*3/uL (ref 4.0–10.5)
nRBC: 0 % (ref 0.0–0.2)

## 2020-11-11 LAB — PREALBUMIN: Prealbumin: 30.9 mg/dL (ref 18–38)

## 2020-11-11 LAB — SAMPLE TO BLOOD BANK

## 2020-11-11 LAB — MAGNESIUM: Magnesium: 1.1 mg/dL — ABNORMAL LOW (ref 1.7–2.4)

## 2020-11-11 LAB — URINE CULTURE: Culture: NO GROWTH

## 2020-11-11 MED ORDER — SODIUM CHLORIDE 0.9% FLUSH
10.0000 mL | Freq: Once | INTRAVENOUS | Status: AC
  Administered 2020-11-11: 10 mL via INTRAVENOUS
  Filled 2020-11-11: qty 10

## 2020-11-11 MED ORDER — HEPARIN SOD (PORK) LOCK FLUSH 100 UNIT/ML IV SOLN
500.0000 [IU] | Freq: Once | INTRAVENOUS | Status: AC
Start: 2020-11-11 — End: 2020-11-11
  Administered 2020-11-11: 500 [IU] via INTRAVENOUS
  Filled 2020-11-11: qty 5

## 2020-11-11 MED ORDER — ATROPINE SULFATE 1 MG/ML IJ SOLN
0.5000 mg | Freq: Once | INTRAMUSCULAR | Status: AC
  Administered 2020-11-11: 0.5 mg via INTRAVENOUS

## 2020-11-11 MED ORDER — ACETAMINOPHEN 500 MG PO TABS
ORAL_TABLET | ORAL | Status: AC
  Filled 2020-11-11: qty 1

## 2020-11-11 MED ORDER — ATROPINE SULFATE 1 MG/ML IJ SOLN
INTRAMUSCULAR | Status: AC
  Filled 2020-11-11: qty 1

## 2020-11-11 MED ORDER — ACETAMINOPHEN 500 MG PO TABS
1000.0000 mg | ORAL_TABLET | Freq: Once | ORAL | Status: AC
  Administered 2020-11-11: 1000 mg via ORAL

## 2020-11-11 MED ORDER — TRAMADOL HCL 50 MG PO TABS: 50.0000 mg | ORAL_TABLET | Freq: Four times a day (QID) | ORAL | 0 refills | Status: DC | PRN

## 2020-11-11 MED ORDER — ACETAMINOPHEN 325 MG PO TABS: 650.0000 mg | ORAL_TABLET | Freq: Once | ORAL | Status: DC

## 2020-11-11 NOTE — Progress Notes (Signed)
Called patient to do meaningful use for his appointment on 6/15 with Ashlyn Bruning  Left voice mail.

## 2020-11-11 NOTE — Telephone Encounter (Signed)
Call placed to [facility] and message left for order for Palliative Care per order of Dr. Marin Olp.  Requested return call. Referral placed via Epic.

## 2020-11-11 NOTE — Progress Notes (Signed)
Hematology and Oncology Follow Up Visit  Kaleth Koy [id number] Mar 06, 1951 70 y.o. 11/11/2020   Principle Diagnosis:  Stage IB ([id number]) superficial spreading melanoma of the left lower leg Small Cell Lung Cancer -- Extensive Stage -- lung/liver/brain mets   Past Therapy: Cranial XRT -- 06/19/2019 thru 07/02/2019 Carboplatin/VP-16/Tecentriq -- started on 07/07/2018, s/p cycle #4 Tecentriq - maintenance -- cycle#1 -- start on 10/01/2019 - d/c on 10/21/2019 Zepzelca/Keytruda -- q 3 wk --s/p cycle #6 - started on 10/28/2019 -- d/c on 03/15/2020   Current Therapy:        CDDP/Irinotecan -- s/p cycle 8 -- started on 03/23/2020   Interim History:  Mr. Deason is here today with his wife for symptom management. He has been having frequent diarrhea and urination with incontinence.  He is a little confused at times. He is oriented to person and place at this time.  He was in the ED on 11/09/20 with dizziness and dehydration. His CT of the head was done without contrast and did not show any changes.  He is very weak/fatigued and has not been eating well. He is doing his best to hydrate.  He states that he fell last week and has no leg strength. He has lower back pain that also effects his mobility.  His weight is stable at 151 lbs.  No fever, chills, n/v, cough, rash, dizziness, SOB, chest pain, palpitations or abdominal pain.  No blood loss noted. No bruising or petechiae.   ECOG Performance Status: 3 - Symptomatic, >50% confined to bed  Medications:  Allergies as of 11/11/2020   No Known Allergies      Medication List        Accurate as of November 11, 2020  4:17 PM. If you have any questions, ask your nurse or doctor.          acetaminophen 500 MG tablet Commonly known as: TYLENOL Take 1,000 mg by mouth every 6 (six) hours as needed for moderate pain.   ALPRAZolam 0.5 MG tablet Commonly known as: XANAX Take 1 tablet (0.5 mg total) by mouth every 6 (six) hours as needed for anxiety.    dexamethasone 4 MG tablet Commonly known as: DECADRON Dexamethasone 4 mg TID x 2 days, BID x 2 days, then 2 mg (1/2 tab) BID x 7 days, then 2 mg (1/2 tab) QD x 7 days then stop.   dexamethasone 4 MG tablet Commonly known as: DECADRON Take 2 tablets (8 mg total) by mouth 2 (two) times daily. You must take with food in the morning and at dinner.   diphenoxylate-atropine 2.5-0.025 MG tablet Commonly known as: LOMOTIL TAKE 2 TABLETS AT ONSET OF DIARRHEA. TAKE 1 TABLET AFTER EACH LOOSE STOOL. MAX 8 TABLETS PER DAY.   dronabinol 5 MG capsule Commonly known as: MARINOL Take 1 capsule (5 mg total) by mouth 2 (two) times daily before a meal.   finasteride 5 MG tablet Commonly known as: PROSCAR TAKE 1 TABLET BY MOUTH EVERY DAY   furosemide 20 MG tablet Commonly known as: LASIX Take 2 tablets (40 mg total) by mouth daily.   levETIRAcetam 500 MG tablet Commonly known as: Keppra Take 1 tablet (500 mg total) by mouth 2 (two) times daily.   levETIRAcetam 500 MG tablet Commonly known as: KEPPRA Take 1 tablet (500 mg total) by mouth 2 (two) times daily.   lidocaine-prilocaine cream Commonly known as: EMLA Apply to affected area once   loperamide 2 MG capsule Commonly known as: IMODIUM   LORazepam  0.5 MG tablet Commonly known as: Ativan Take 1 tablet (0.5 mg total) by mouth every 6 (six) hours as needed (Nausea or vomiting).   multivitamin capsule Take 1 capsule by mouth daily.   omeprazole 40 MG capsule Commonly known as: PRILOSEC Take 1 capsule (40 mg total) by mouth 2 (two) times daily. Please call [phone] to schedule an office visit for more refills.   ondansetron 4 MG tablet Commonly known as: ZOFRAN Take 1 tablet (4 mg total) by mouth every 6 (six) hours as needed for nausea.   ondansetron 8 MG tablet Commonly known as: Zofran Take 1 tablet (8 mg total) by mouth 2 (two) times daily as needed. Start on the third day after cisplatin chemotherapy.   ondansetron 8 MG  disintegrating tablet Commonly known as: ZOFRAN-ODT Take 8 mg by mouth.   OPCON-A OP Place 2 drops into both eyes as needed (for dry eyes).   prochlorperazine 10 MG tablet Commonly known as: COMPAZINE Take 1 tablet (10 mg total) by mouth every 6 (six) hours as needed (Nausea or vomiting).   tamsulosin 0.4 MG Caps capsule Commonly known as: FLOMAX Take 1 capsule (0.4 mg total) by mouth daily.   traMADol 50 MG tablet Commonly known as: ULTRAM Take 1 tablet (50 mg total) by mouth every 6 (six) hours as needed. Started by: Laverna Peace, NP        Allergies: No Known Allergies  Past Medical History, Surgical history, Social history, and Family History were reviewed and updated.  Review of Systems: All other 10 point review of systems is negative.   Physical Exam:  weight is 151 lb (68.5 kg). His oral temperature is 98.2 F (36.8 C). His blood pressure is 100/69 and his pulse is 104 (abnormal). His respiration is 18 and oxygen saturation is 94%.   Wt Readings from Last 3 Encounters:  11/11/20 151 lb (68.5 kg)  11/09/20 150 lb (68 kg)  10/21/20 149 lb (67.6 kg)    Ocular: Sclerae unicteric, pupils equal, round and reactive to light Ear-nose-throat: Oropharynx clear, dentition fair Lymphatic: No cervical, supraclavicular or axillary adenopathy Lungs no rales or rhonchi, good excursion bilaterally Heart regular rate and rhythm, no murmur appreciated Abd soft, nontender, positive bowel sounds MSK no focal spinal tenderness, no joint edema Neuro: non-focal, well-oriented, appropriate affect Breasts: Deferred   Lab Results  Component Value Date   WBC 4.6 11/11/2020   HGB 9.0 (L) 11/11/2020   HCT 25.7 (L) 11/11/2020   MCV 95.9 11/11/2020   PLT 74 (L) 11/11/2020   Lab Results  Component Value Date   FERRITIN 3,494 (H) 11/05/2020   IRON 115 11/05/2020   TIBC 195 (L) 11/05/2020   UIBC 80 (L) 11/05/2020   IRONPCTSAT 59 (H) 11/05/2020   Lab Results  Component  Value Date   RETICCTPCT 1.5 08/07/2019   RBC 2.68 (L) 11/11/2020   No results found for: KPAFRELGTCHN, LAMBDASER, KAPLAMBRATIO No results found for: IGGSERUM, IGA, IGMSERUM No results found for: Odetta Pink, SPEI   Chemistry      Component Value Date/Time   NA 136 11/11/2020 1430   NA 142 01/30/2017 0804   K 4.2 11/11/2020 1430   K 4.1 01/30/2017 0804   CL 102 11/11/2020 1430   CL 105 01/25/2016 0926   CO2 25 11/11/2020 1430   CO2 29 01/30/2017 0804   BUN 16 11/11/2020 1430   BUN 6.9 (L) 01/30/2017 0804   CREATININE 1.41 (H) 11/11/2020 1430  CREATININE 0.8 01/30/2017 0804      Component Value Date/Time   CALCIUM 9.3 11/11/2020 1430   CALCIUM 10.5 (H) 01/30/2017 0804   ALKPHOS 58 11/11/2020 1430   ALKPHOS 79 01/30/2017 0804   AST 11 (L) 11/11/2020 1430   AST 34 01/30/2017 0804   ALT 8 11/11/2020 1430   ALT 18 01/30/2017 0804   BILITOT 0.5 11/11/2020 1430   BILITOT 1.49 (H) 01/30/2017 0804       Impression and Plan: Mr. [place] is a very pleasant 70 yo caucasian gentleman with history of stage Ib melanoma of the left lower leg and extensive stage metastatic small cell lung cancer.  He did not receive Atropine with Irinotecan last week so we will give today. Hopefully this will help resolve his diarrhea and he can take Lomotil at home if needed.  We will also have him stop taking Flomax and see if this helps with the frequent urination.  At this time we will discontinue chemotherapy. He is just not able to handle the reduced doses and it is significantly effecting his quality of life.  Dr. Marin Olp was present during this visit and hospice was discussed. At this time they would like to hold off on hospice and move forward with palliative care.  They would like a referral to Dr. Phylliss Bob for possible treatment for the lower back issues and possible PT. Referral placed and Dr. Marin Olp also called and spoke with MD.  No  replacement fluids or transfusion needed at this time.  He has a follow-up appointment with Dr. Marin Olp on 11/24/20 and we will keep this.   Laverna Peace, NP 6/8/20224:17 PM

## 2020-11-11 NOTE — Patient Instructions (Signed)
Atropine injection What is this medicine? ATROPINE (A troe peen) can help treat many conditions. This medicine is used to reduce saliva and fluid in the respiratory tract during surgery. It is also used to treat insecticide or mushroom poisoning. It can be used in an emergency to treat a slow heartbeat. This medicine may be used for other purposes; ask your health care provider or pharmacist if you have questions. What should I tell my health care provider before I take this medicine? They need to know if you have any of these conditions:  closed-angle glaucoma  heart disease, or previous heart attack  kidney disease  prostate trouble  stomach obstruction  an unusual or allergic reaction to atropine, other medicines, foods, dyes, or preservatives  pregnant or trying to get pregnant  breast-feeding How should I use this medicine? This medicine is for injection into a muscle, vein, or under the skin. It is usually given by a health care professional in a hospital or clinic setting. If you get this medicine at home, you will be taught how to prepare and give this medicine. Use exactly as directed. It should only be given by persons who have training in the signs and treatment of nerve agent or insecticide poisoning. It is important that you put your used needles and syringes in a special sharps container. Do not put them in a trash can. If you do not have a sharps container, call your pharmacist or healthcare provider to get one. Talk to your pediatrician regarding the use of this medicine in children. Special care may be needed. Overdosage: If you think you have taken too much of this medicine contact a poison control center or emergency room at once. NOTE: This medicine is only for you. Do not share this medicine with others. What if I miss a dose? This does not apply. What may interact with this medicine?  atomoxetine  barbiturates, like  phenobarbital  benztropine  donepezil  ephedra  galantamine  glutethimide  medicines for mental problems and psychotic disturbances  medicines for Parkinson's disease  pralidoxime  rivastigmine  some medicines for congestion, cold, or allergy  stimulant medicines for attention disorders, weight loss, or to stay awake  tacrine  tegaserod This list may not describe all possible interactions. Give your health care provider a list of all the medicines, herbs, non-prescription drugs, or dietary supplements you use. Also tell them if you smoke, drink alcohol, or use illegal drugs. Some items may interact with your medicine. What should I watch for while using this medicine? Side effects may occur even though you are no longer using this medicine. Contact your doctor or health care professional if you are still experiencing side effects after several days. You may get drowsy or dizzy. Do not drive, use machinery, or do anything that needs mental alertness until you know how this medicine affects you. Do not stand or sit up quickly, especially if you are an older patient. This reduces the risk of dizzy or fainting spells. Alcohol may interfere with the effect of this medicine. Avoid alcoholic drinks. Your mouth may get dry. Chewing sugarless gum or sucking hard candy, and drinking plenty of water may help. Contact your doctor if the problem does not go away or is severe. This medicine may cause dry eyes and blurred vision. If you wear contact lenses you may feel some discomfort. Lubricating drops may help. See your eye doctor if the problem does not go away or is severe. Avoid extreme heat.  This medicine can cause you to sweat less than normal. Your body temperature could increase to dangerous levels, which may lead to heat stroke. What side effects may I notice from receiving this medicine? Side effects that you should report to your doctor or health care professional as soon as  possible:  allergic reactions like skin rash, itching or hives, swelling of the face, lips, or tongue  anxiety, nervousness  changes in vision  confusion  fast or slow heartbeat  feeling faint or lightheaded, falls  hallucinations  memory loss  redness, blistering, peeling or loosening of the skin, including inside the mouth  slurred speech  trouble passing urine or change in the amount of urine  unusually weak or tired  vomiting Side effects that usually do not require medical attention (report to your doctor or health care professional if they continue or are bothersome):  constipation  dry mouth  flushing or redness of face or skin within 15 to 20 minutes after the injection  nausea This list may not describe all possible side effects. Call your doctor for medical advice about side effects. You may report side effects to FDA at 1-800-FDA-1088. Where should I keep my medicine? Keep out of the reach of children. If you are using this medicine at home, you will be instructed on how to store this medicine. Throw away any unused medicine after the expiration date on the label. NOTE: This sheet is a summary. It may not cover all possible information. If you have questions about this medicine, talk to your doctor, pharmacist, or health care provider.  2021 [employer] (2007-08-15 09:47:01)

## 2020-11-11 NOTE — Patient Instructions (Signed)
Implanted Port Insertion, Care After This sheet gives you information about how to care for yourself after your procedure. Your health care provider may also give you more specific instructions. If you have problems or questions, contact your health care provider. What can I expect after the procedure? After the procedure, it is common to have:  Discomfort at the port insertion site.  Bruising on the skin over the port. This should improve over 3-4 days. Follow these instructions at home: Port care  After your port is placed, you will get a manufacturer's information card. The card has information about your port. Keep this card with you at all times.  Take care of the port as told by your health care provider. Ask your health care provider if you or a family member can get training for taking care of the port at home. A home health care nurse may also take care of the port.  Make sure to remember what type of port you have. Incision care  Follow instructions from your health care provider about how to take care of your port insertion site. Make sure you: ? Wash your hands with soap and water before and after you change your bandage (dressing). If soap and water are not available, use hand sanitizer. ? Change your dressing as told by your health care provider. ? Leave stitches (sutures), skin glue, or adhesive strips in place. These skin closures may need to stay in place for 2 weeks or longer. If adhesive strip edges start to loosen and curl up, you may trim the loose edges. Do not remove adhesive strips completely unless your health care provider tells you to do that.  Check your port insertion site every day for signs of infection. Check for: ? Redness, swelling, or pain. ? Fluid or blood. ? Warmth. ? Pus or a bad smell.      Activity  Return to your normal activities as told by your health care provider. Ask your health care provider what activities are safe for you.  Do not  lift anything that is heavier than 10 lb (4.5 kg), or the limit that you are told, until your health care provider says that it is safe. General instructions  Take over-the-counter and prescription medicines only as told by your health care provider.  Do not take baths, swim, or use a hot tub until your health care provider approves. Ask your health care provider if you may take showers. You may only be allowed to take sponge baths.  Do not drive for 24 hours if you were given a sedative during your procedure.  Wear a medical alert bracelet in case of an emergency. This will tell any health care providers that you have a port.  Keep all follow-up visits as told by your health care provider. This is important. Contact a health care provider if:  You cannot flush your port with saline as directed, or you cannot draw blood from the port.  You have a fever or chills.  You have redness, swelling, or pain around your port insertion site.  You have fluid or blood coming from your port insertion site.  Your port insertion site feels warm to the touch.  You have pus or a bad smell coming from the port insertion site. Get help right away if:  You have chest pain or shortness of breath.  You have bleeding from your port that you cannot control. Summary  Take care of the port as told by your   health care provider. Keep the manufacturer's information card with you at all times.  Change your dressing as told by your health care provider.  Contact a health care provider if you have a fever or chills or if you have redness, swelling, or pain around your port insertion site.  Keep all follow-up visits as told by your health care provider. This information is not intended to replace advice given to you by your health care provider. Make sure you discuss any questions you have with your health care provider. Document Revised: 12/19/2017 Document Reviewed: 12/19/2017 Elsevier Patient Education   2021 [employer]  

## 2020-11-12 ENCOUNTER — Telehealth: Payer: Self-pay

## 2020-11-12 ENCOUNTER — Telehealth: Payer: Self-pay | Admitting: *Deleted

## 2020-11-12 NOTE — Telephone Encounter (Signed)
-----   Message from Volanda Napoleon, MD sent at 11/11/2020  5:09 PM EDT ----- Please call his wife.  His magnesium is quite low.  We really need to get him in for some IV magnesium.  This might help with his leg weakness.  Please try to set this up for this week.  Thanks.  Laurey Arrow

## 2020-11-12 NOTE — Telephone Encounter (Signed)
No 11/11/20 los  Hillard Goodwine

## 2020-11-12 NOTE — Progress Notes (Signed)
Spoke with patients wife Kennyth Lose to inform her that patient has a phone visit with Ashlyn Bruning on 6/15.Patients wife states that all medications are the same.Patient  wife states that the patients has headaches ,but that he has medication for relief.Reports moderate fatigue. Denies any nausea or vomiting.

## 2020-11-13 ENCOUNTER — Other Ambulatory Visit: Payer: Self-pay | Admitting: Hematology & Oncology

## 2020-11-13 ENCOUNTER — Encounter: Payer: Self-pay | Admitting: Family

## 2020-11-13 ENCOUNTER — Encounter: Payer: Self-pay | Admitting: Hematology & Oncology

## 2020-11-13 ENCOUNTER — Telehealth: Payer: Self-pay

## 2020-11-13 ENCOUNTER — Other Ambulatory Visit: Payer: Self-pay

## 2020-11-13 ENCOUNTER — Inpatient Hospital Stay: Payer: Medicare Other

## 2020-11-13 VITALS — BP 136/83 | HR 78 | Temp 97.8°F | Resp 19

## 2020-11-13 DIAGNOSIS — C3431 Malignant neoplasm of lower lobe, right bronchus or lung: Secondary | ICD-10-CM | POA: Diagnosis not present

## 2020-11-13 DIAGNOSIS — Z5111 Encounter for antineoplastic chemotherapy: Secondary | ICD-10-CM | POA: Diagnosis not present

## 2020-11-13 DIAGNOSIS — R197 Diarrhea, unspecified: Secondary | ICD-10-CM | POA: Diagnosis not present

## 2020-11-13 DIAGNOSIS — C787 Secondary malignant neoplasm of liver and intrahepatic bile duct: Secondary | ICD-10-CM | POA: Diagnosis not present

## 2020-11-13 DIAGNOSIS — Z79899 Other long term (current) drug therapy: Secondary | ICD-10-CM

## 2020-11-13 DIAGNOSIS — C4372 Malignant melanoma of left lower limb, including hip: Secondary | ICD-10-CM | POA: Diagnosis not present

## 2020-11-13 DIAGNOSIS — C3490 Malignant neoplasm of unspecified part of unspecified bronchus or lung: Secondary | ICD-10-CM

## 2020-11-13 DIAGNOSIS — C7931 Secondary malignant neoplasm of brain: Secondary | ICD-10-CM | POA: Diagnosis not present

## 2020-11-13 MED ORDER — HEPARIN SOD (PORK) LOCK FLUSH 100 UNIT/ML IV SOLN
500.0000 [IU] | Freq: Once | INTRAVENOUS | Status: AC
  Administered 2020-11-13: 500 [IU] via INTRAVENOUS
  Filled 2020-11-13: qty 5

## 2020-11-13 MED ORDER — MAGNESIUM SULFATE 2 GM/50ML IV SOLN
2.0000 g | Freq: Once | INTRAVENOUS | Status: AC
Start: 2020-11-13 — End: 2020-11-13
  Administered 2020-11-13: 2 g via INTRAVENOUS
  Filled 2020-11-13: qty 50

## 2020-11-13 MED ORDER — MAGNESIUM SULFATE 2 GM/50ML IV SOLN
2.0000 g | Freq: Once | INTRAVENOUS | Status: DC
  Filled 2020-11-13: qty 50

## 2020-11-13 MED ORDER — ATROPINE SULFATE 1 MG/ML IJ SOLN
0.5000 mg | Freq: Once | INTRAMUSCULAR | Status: AC
  Administered 2020-11-13: 0.5 mg via INTRAVENOUS

## 2020-11-13 MED ORDER — SODIUM CHLORIDE 0.9 % IV SOLN
Freq: Once | INTRAVENOUS | Status: AC
  Filled 2020-11-13: qty 250

## 2020-11-13 MED ORDER — ATROPINE SULFATE 1 MG/ML IJ SOLN
INTRAMUSCULAR | Status: AC
  Filled 2020-11-13: qty 1

## 2020-11-13 MED ORDER — SODIUM CHLORIDE 0.9% FLUSH
10.0000 mL | Freq: Once | INTRAVENOUS | Status: AC
  Administered 2020-11-13: 10 mL via INTRAVENOUS
  Filled 2020-11-13: qty 10

## 2020-11-13 NOTE — Patient Instructions (Signed)
Magnesium Sulfate Injection What is this medication? MAGNESIUM SULFATE (mag NEE zee um SUL fate) prevents and treats low levels of magnesium in your body. It may also be used to prevent and treat seizures during pregnancy in people with high blood pressure disorders, such as preeclampsia or eclampsia. Magnesium plays an important role in maintaining the health of your muscles and nervous system. This medicine may be used for other purposes; ask your health care provider or pharmacist if you have questions. What should I tell my care team before I take this medication? They need to know if you have any of these conditions: Heart disease History of irregular heart beat Kidney disease An unusual or allergic reaction to magnesium sulfate, medications, foods, dyes, or preservatives Pregnant or trying to get pregnant Breast-feeding How should I use this medication? This medication is for infusion into a vein. It is given in a hospital or clinic setting. Talk to your care team about the use of this medication in children. While this medication may be prescribed for selected conditions, precautions do apply. Overdosage: If you think you have taken too much of this medicine contact a poison control center or emergency room at once. NOTE: This medicine is only for you. Do not share this medicine with others. What if I miss a dose? This does not apply. What may interact with this medication? Certain medications for anxiety or sleep Certain medications for seizures like phenobarbital Digoxin Medications that relax muscles for surgery Narcotic medications for pain This list may not describe all possible interactions. Give your health care provider a list of all the medicines, herbs, non-prescription drugs, or dietary supplements you use. Also tell them if you smoke, drink alcohol, or use illegal drugs. Some items may interact with your medicine. What should I watch for while using this medication? Your  condition will be monitored carefully while you are receiving this medication. You may need blood work done while you are receiving this medication. What side effects may I notice from receiving this medication? Side effects that you should report to your care team as soon as possible: Allergic reactions-skin rash, itching, hives, swelling of the face, lips, tongue, or throat High magnesium level-confusion, drowsiness, facial flushing, redness, sweating, muscle weakness, fast or irregular heartbeat, trouble breathing Low blood pressure-dizziness, feeling faint or lightheaded, blurry vision Side effects that usually do not require medical attention (report to your care team if they continue or are bothersome): Headache Nausea This list may not describe all possible side effects. Call your doctor for medical advice about side effects. You may report side effects to FDA at 1-800-FDA-1088. Where should I keep my medication? This medication is given in a hospital or clinic and will not be stored at home. NOTE: This sheet is a summary. It may not cover all possible information. If you have questions about this medicine, talk to your doctor, pharmacist, or health care provider.  2022 [employer] Standard (2020-07-13 13:10:26)  

## 2020-11-13 NOTE — Telephone Encounter (Signed)
Attempted to contact patient to schedule a Palliative Care consult appointment. No answer left a message to return call.  

## 2020-11-13 NOTE — Progress Notes (Signed)
mag

## 2020-11-17 NOTE — Progress Notes (Signed)
Patient currently receiving supportive care only, chemotherapy careplan removed per Dr. Antonieta Pert instructions.

## 2020-11-18 ENCOUNTER — Encounter: Payer: Self-pay | Admitting: Family

## 2020-11-18 ENCOUNTER — Other Ambulatory Visit: Payer: Self-pay

## 2020-11-18 ENCOUNTER — Ambulatory Visit
Admission: RE | Admit: 2020-11-18 | Discharge: 2020-11-18 | Disposition: A | Discharge status: Home / Self Care | Payer: Medicare Other | Source: Ambulatory Visit | Attending: Urology | Admitting: Urology

## 2020-11-18 DIAGNOSIS — C7931 Secondary malignant neoplasm of brain: Secondary | ICD-10-CM

## 2020-11-18 NOTE — Progress Notes (Signed)
Radiation Oncology         [phone] ________________________________  Name: Walter Hernandez MRN: [id number]  Date: 11/18/2020  DOB: 1951/04/24  Post Treatment Note  CC: Lavone Orn, MD  Lavone Orn, MD  Diagnosis:   70 yo man with brain metastases from extensive stage small cell lung cancer.  Interval Since Last Radiation:  1 month 10/05/20: Patient received stereotactic radiosurgery to the following targets in a single fraction:   07/22/20:  These six distinct brain metastases were treated using single isocenter to a prescription dose of 18-20 Gy in a single fraction as below:    1/13-1/26/21:   The whole brain was treated to 30 Gy in 10 fractions of 3 Gy  Narrative:  I spoke with the patient and his wife to conduct his routine scheduled 1 month follow up visit  via telephone to spare the patient unnecessary potential exposure in the healthcare setting during the current COVID-19 pandemic.  The patient was notified in advance and gave permission to proceed with this visit format.  The patient has recovered well from his recent [facility] treatment and has had previous WBRT that was tolerated well also.  At the time of our last visit in 08/20/2020, he reported some mild residual fatigue following his SRS treatment in 07/2020 but reported that this was much improved and he had gotten back to caring form most of his ADLs independently. He was able to resume chemotherapy with a reduced dose of CDDP/Irinotecan received on 08/17/20 since his blood counts had improved. He tolerated this fairly well but continued with decreased appetite and generalized weakness. He denied, N/V, dizziness, imbalance, focal weakness, changes in auditory or visual acuity, tremors or seizure activity.   In summary, he completed 4 cycles of systemic chemotherapy with Carboplatin/VP-16/Tecentriq and transitioned to maintenance immunotherapy with single agent Tecentriq on 10/01/19 under the care of Dr. Marin Olp.  Unfortunately, a  repeat PET scan on 10/17/19 showed progressive disease with worsening liver metastases and increased LAN in the mediastinum, hilum and upper abdomen. Therefore, the Tecentriq was discontinued and he was switched to Zelpelca/Keytruda with his first cycle on 10/28/19. A restaging PET scan on 12/23/19, after completing 3 cycles of his new systemic therapy showed a near complete metabolic response to treatment with only mild residual hypermetabolism within a single portacaval nodal metastasis, which had decreased in size and significantly decreased in metabolism. Otherwise resolved right lower neck, mediastinal, right hilar and upper abdominal lymphadenopathy and resolved liver metastases. However, a follow up PET scan for disease restaging, performed on 03/16/20 showed significant disease progression with approximately 12 new metastatic lesions in the liver; new and increased porta hepatis and portacaval adenopathy; and new and increased thoracic adenopathy in addition to a right level V lymph node which is small but hypermetabolic. His systemic therapy was again changed, discontinuing the Zelpelca/Keytruda and starting CDDP/Irinotecan cycle #1 on 03/23/2020. A repeat PET scan on 04/29/20 showed an excellent response to treatment with marked improvement with the hepatic metastatic lesions markedly reduced in size and no longer hypermetabolic and with the thoracic, porta hepatis and right neck level V lymph nodes no longer hypermetabolic and also reduced in size.  His follow-up brain MRI scans continued to show an excellent response to his whole brain radiation treatment with stable to decreased size of a treated right frontal lesion, stable punctate left frontal lesion and no new masses or abnormal enhancement. He continued to tolerate the systemic treatments well aside from some diarrhea which has been  manageable.  As of 07/06/2020, he had completed 4 cycles of the CDDP/Irinotecan with several treatment delays and dose  adjustments along the way.  Unfortunately, his follow up MRI brain scan from 07/10/2020 showed disease progression with 6 new enhancing lesions with a 12 mm right frontal lobe lesion, 10 mm medial right thalamic lesion, 6 mm right mid brain lesion, 12 mm left temporal lesion, 1 mm left occipital lesion and a 2 mm left cerebellar lesion.  On review with the radiologist at multidisciplinary conference, the cerebellar lesion was felt most likely to be vascular and not a metastasis. We reviewed the results by telephone on 07/14/2020 as well as the consensus recommendation from the recent multidisciplinary brain conference on 07/13/2020, to proceed with salvage SRS treatment in a single fraction. He was in agreement and this treatment was completed on 07/22/20. He tolerated the treatment well but did have some cognitive changes a few days following treatment where he was extremely tired and not wanting to get out of bed and was not eating due to anorexia. He denied headache, nausea, dizziness, change in vision, or any other new CNS symptoms. He was started on a short course of low dose steroids to treat potential treatment related edema/swelling and he completed the taper as prescribed with resolution of symptoms.  He did meet with Dr. Mickeal Skinner on 08/07/20 and was recommended to start [place] for possible focal seizure but he did not take the medication initially. He then presented to the ED on 09/05/20 for evaluation following new onset seizure-like activity with sudden left hand shaking at dinner, before becoming unresponsive with bilateral hands shaking for approximately 3-4 minutes prior to resolving spontaneously. He returned to his baseline mental status prior to presenting to the ED and has not had any further seizure activity but was started on Keppra 500mg  po BID which he has continued taking as prescribed.  A CT of the brain performed at that visit showed no midline shift or any pathology. His scheduled chemotherapy was  held on 09/07/20 due to low blood counts. MRI brain on 09/21/20 showed a mixed response to treatment with increased size of 2 metastatic lesions (72mm left frontal lesion that is duraforme and a 7 mm lesion in the right insula), but resolution of the other previously seen lesions. There was also a questionable focus of contrast enhancement at the left caudate tail, favored to be vascular.  Otherwise, no new lesions, no midline shift or other mass-effect and no acute hemorrhage or infarct.  Dr. Marin Olp started him on dexamethasone 8 mg p.o. twice daily and we saw him in the office for CT simulation/treatment planning for SRS to both of these lesions in a single fraction, which was completed on 10/05/2020 and tolerated well.  Following treatment, he was tapered off of the Decadron without issue. He had a PET scan on 10/19/2020 which remained stable with no new suspicious hypermetabolic activity to suggest recurrent or metastatic disease.  His blood counts remained low and he continued feeling significantly fatigued with generalized weakness making it very difficult to ambulate despite holding systemic therapy.  He did get a significantly reduced dose of his systemic therapy on 11/05/2020 but ended up in the emergency department on 11/09/2020 with progressive fatigue, dizziness and generalized weakness.  This was managed with IV fluids and he did feel well enough to discharge home that same day.  While he was there, he did have a CT head without contrast and this was without any evidence of acute  intracranial abnormality.  At the time of his follow-up visit with Dr. Marin Olp on 11/11/2020, the decision was made to hold systemic therapy for now and focus more on palliative care for symptomatic management.  They briefly discussed the transition to hospice but he does not feel ready for this currently.  On review of systems, the patient states that he is doing fair in general.  He has had significant fatigue associated with his  low blood counts as well as dehydration secondary to significant diarrhea and lack of appetite associated with systemic therapy.  He continues with poor appetite but has continued to eat meals regularly in an attempt to maintain his weight. He has had significant improvement in his swallowing with solids since having an esophageal dilatation. He is drinking 2-3 protein shakes per day to supplement his nutrition in addition to more regular meals. He still has some decreased taste/change in his taste buds but is adapting.  His main complaint is of persistent generalized weakness and chronic low back pain to the point that he does not feel safe to ambulate independently.  Otherwise, he denies severe headaches, dizziness, paraesthesias, focal weakness, tremors or seizure activity.    ALLERGIES:  has No Known Allergies.  Meds: Current Outpatient Medications  Medication Sig Dispense Refill   acetaminophen (TYLENOL) 500 MG tablet Take 1,000 mg by mouth every 6 (six) hours as needed for moderate pain.     ALPRAZolam (XANAX) 0.5 MG tablet Take 1 tablet (0.5 mg total) by mouth every 6 (six) hours as needed for anxiety. 60 tablet 0   dexamethasone (DECADRON) 4 MG tablet Take 2 tablets (8 mg total) by mouth 2 (two) times daily. You must take with food in the morning and at dinner. 100 tablet 4   diphenoxylate-atropine (LOMOTIL) 2.5-0.025 MG tablet TAKE 2 TABLETS AT ONSET OF DIARRHEA. TAKE 1 TABLET AFTER EACH LOOSE STOOL. MAX 8 TABLETS PER DAY. 60 tablet 0   dronabinol (MARINOL) 5 MG capsule Take 1 capsule (5 mg total) by mouth 2 (two) times daily before a meal. 60 capsule 2   finasteride (PROSCAR) 5 MG tablet TAKE 1 TABLET BY MOUTH EVERY DAY 90 tablet 1   furosemide (LASIX) 20 MG tablet Take 2 tablets (40 mg total) by mouth daily. 30 tablet 5   levETIRAcetam (KEPPRA) 500 MG tablet Take 1 tablet (500 mg total) by mouth 2 (two) times daily. 60 tablet 0   levETIRAcetam (KEPPRA) 500 MG tablet Take 1 tablet (500 mg  total) by mouth 2 (two) times daily. 60 tablet 6   loperamide (IMODIUM) 2 MG capsule      Multiple Vitamin (MULTIVITAMIN) capsule Take 1 capsule by mouth daily.     Naphazoline-Pheniramine (OPCON-A OP) Place 2 drops into both eyes as needed (for dry eyes).     omeprazole (PRILOSEC) 40 MG capsule Take 1 capsule (40 mg total) by mouth 2 (two) times daily. Please call [phone] to schedule an office visit for more refills. 30 capsule 3   ondansetron (ZOFRAN) 4 MG tablet Take 1 tablet (4 mg total) by mouth every 6 (six) hours as needed for nausea. 20 tablet 0   ondansetron (ZOFRAN-ODT) 8 MG disintegrating tablet Take 8 mg by mouth.     tamsulosin (FLOMAX) 0.4 MG CAPS capsule Take 1 capsule (0.4 mg total) by mouth daily. 60 capsule 1   traMADol (ULTRAM) 50 MG tablet Take 1 tablet (50 mg total) by mouth every 6 (six) hours as needed. 30 tablet 0   dexamethasone (  DECADRON) 4 MG tablet Dexamethasone 4 mg TID x 2 days, BID x 2 days, then 2 mg (1/2 tab) BID x 7 days, then 2 mg (1/2 tab) QD x 7 days then stop. (Patient not taking: Reported on 11/12/2020) 30 tablet 0   No current facility-administered medications for this encounter.   Facility-Administered Medications Ordered in Other Encounters  Medication Dose Route Frequency Provider Last Rate Last Admin   sodium chloride flush (NS) 0.9 % injection 10 mL  10 mL Intravenous PRN Volanda Napoleon, MD   10 mL at 10/21/19 [id number]    Physical Findings:  vitals were not taken for this visit.   Karen Kays to assess due to telephone follow-up visit format.  Lab Findings: Lab Results  Component Value Date   WBC 4.6 11/11/2020   HGB 9.0 (L) 11/11/2020   HCT 25.7 (L) 11/11/2020   MCV 95.9 11/11/2020   PLT 74 (L) 11/11/2020     Radiographic Findings: DG Chest 2 View  Result Date: 11/09/2020 CLINICAL DATA:  Weakness and dizziness.  Small cell lung cancer. EXAM: CHEST - 2 VIEW COMPARISON:  PET-CT 10/19/2020 FINDINGS: Power injectable Port-A-Cath tip: SVC.  Symmetric nodularity at the lung bases ascribed nipple shadows. Thoracic spondylosis. Heart size within normal limits. Atherosclerotic calcification of the aortic arch. No blunting of the costophrenic angles. IMPRESSION: 1.  No active cardiopulmonary disease is radiographically apparent. 2.  Aortic Atherosclerosis (ICD10-I70.0). 3. Thoracic spondylosis. Electronically Signed   By: Van Clines M.D.   On: 11/09/2020 17:42   CT Head Wo Contrast  Result Date: 11/09/2020 CLINICAL DATA:  Generalized weakness for 1 month with dizziness. Increased urination at night. No reported injury. History of melanoma metastatic to the brain. EXAM: CT HEAD WITHOUT CONTRAST TECHNIQUE: Contiguous axial images were obtained from the base of the skull through the vertex without intravenous contrast. COMPARISON:  09/05/2020 head CT. FINDINGS: Brain: Generalized cerebral volume loss. Nonspecific prominent subcortical and periventricular white matter hypodensity, most in keeping with chronic small vessel ischemic change. No evidence of parenchymal hemorrhage or extra-axial fluid collection. No mass lesion, mass effect, or midline shift. No CT evidence of acute infarction. No ventriculomegaly. Vascular: No acute abnormality. Skull: No evidence of calvarial fracture. Sinuses/Orbits: No fluid levels. Mild patchy opacification of the ethmoidal air cells. Other:  The mastoid air cells are unopacified. IMPRESSION: 1. No evidence of acute intracranial abnormality. 2. Generalized cerebral volume loss and prominent chronic small vessel ischemic changes in the cerebral white matter. 3. Two known brain metastases seen on 09/21/2020 MRI are occult by today's noncontrast CT. Electronically Signed   By: Ilona Sorrel M.D.   On: 11/09/2020 17:37     Impression/Plan: 69. 70 yo male with brain metastases from extensive stage small cell lung cancer. He appears to have recovered well from the effects of his recent salvage SRS brain radiation but  has continued to see a decline in his performance status associated with even significantly reduced dose of systemic therapy.  We discussed the plan to obtain a post-treatment MRI brain in 01/2021 to assess treatment response and pending this scan is stable, we would anticipate resuming serial MRI scans of the brain every 3 months for surveillance. He does require pre-medication with Ativan to tolerate the MRI scans so I will make sure this is available to him for use prior to each scan. We will follow up by telephone following each scan to review results and recommendation from the multi-disciplinary brain conference.  He will also continue in  routine follow-up under the care and direction of Dr. Marin Olp for continued management of his systemic disease.  He and his wife appear to have a good understanding of these recommendations and are comfortable and in agreement with the stated plan.  They know to call at anytime with any questions or concerns related to his previous radiation.  Given current concerns for patient exposure during the COVID-19 pandemic, this encounter was conducted via telephone. The patient was notified in advance and was offered a [place] meeting to allow for face to face communication but unfortunately reported that he did not have the appropriate resources/technology to support such a visit and instead preferred to proceed with telephone consult. The patient has given verbal consent for this type of encounter. The time spent during this encounter was 25 minutes. The attendants for this meeting include Tamsyn Owusu PA-C, patient, Laiden Milles and his wife, Kennyth Lose. During the encounter, Aidyn Kellis PA-C, was located at [facility] Radiation Oncology Department.  Patient, Ken Bonn and his wife, Kennyth Lose  were located at home.    Nicholos Johns, PA-C

## 2020-11-18 NOTE — Progress Notes (Signed)
  Radiation Oncology         [phone] ________________________________  Name: Walter Hernandez MRN: [id number]  Date: 10/05/2020  DOB: 01-Oct-1950  End of Treatment Note  Diagnosis:   70 yo man with brain metastases from small cell lung cancer s/p previous whole brain radiotherapy.     Indication for treatment:  Palliation       Radiation treatment dates:   10/05/20  Site/dose/beams/energy:    [name] received stereotactic radiosurgery to the following targets:   The 2 new brain metastases were treated using single isocenter 4 Rapid Arc VMAT Beams to a prescription dose of 20 Gy.  ExacTrac registration was performed for each couch angle.  The 100% isodose line was prescribed.  6 MV X-rays were delivered in the flattening filter free beam mode.  Narrative: The patient tolerated radiation treatment relatively well.     Plan: The patient has completed radiation treatment. The patient will return to radiation oncology clinic for routine followup in one month. I advised him to call or return sooner if he has any questions or concerns related to his recovery or treatment. ________________________________  Sheral Apley. Tammi Klippel, M.D.

## 2020-11-19 ENCOUNTER — Telehealth: Payer: Self-pay

## 2020-11-19 ENCOUNTER — Encounter: Payer: Self-pay | Admitting: *Deleted

## 2020-11-19 NOTE — Telephone Encounter (Signed)
Attempted to contact patient to schedule a Palliative Care consult appointment. No answer left a message to return call.  

## 2020-11-19 NOTE — Progress Notes (Signed)
Patient's wife, Colletta [place], seeking clarification on appointment for next week now that patient will not be receiving treatment. Explained that his infusion appointment is there as a Retail banker for any supportive care that [name] may need. She expressed understanding.   Oncology Nurse Navigator Documentation  Oncology Nurse Navigator Flowsheets 11/19/2020  Abnormal Finding Date -  Confirmed Diagnosis Date -  Diagnosis Status -  Planned Course of Treatment -  Phase of Treatment -  Chemotherapy Actual Start Date: -  Radiation Actual Start Date: -  Navigator Follow Up Date: 11/24/2020  Navigator Follow Up Reason: Follow-up Appointment  Navigator Location [facility]  Navigator Encounter Type Telephone  Telephone Appt Confirmation/Clarification;Incoming Call  Treatment Initiated Date -  Patient Visit Type MedOnc  Treatment Phase Active Tx  Barriers/Navigation Needs Coordination of Care;Education  Education Other  Interventions Education;Psycho-Social Support  Acuity Level 2-Minimal Needs (1-2 Barriers Identified)  Referrals -  Coordination of Care -  Education Method Verbal  Support Groups/Services Friends and Family  Time Spent with Patient 15

## 2020-11-23 ENCOUNTER — Telehealth: Payer: Self-pay

## 2020-11-23 NOTE — Telephone Encounter (Signed)
Attempted to contact patient on both contacts numbers in chart to schedule a Palliative Care consult appointment. No answer left a message to return call. Sent My Chart message also.

## 2020-11-24 ENCOUNTER — Encounter: Payer: Self-pay | Admitting: *Deleted

## 2020-11-24 ENCOUNTER — Encounter: Payer: Self-pay | Admitting: Family

## 2020-11-24 ENCOUNTER — Inpatient Hospital Stay: Payer: Medicare Other

## 2020-11-24 ENCOUNTER — Other Ambulatory Visit: Payer: Self-pay

## 2020-11-24 ENCOUNTER — Inpatient Hospital Stay (HOSPITAL_BASED_OUTPATIENT_CLINIC_OR_DEPARTMENT_OTHER): Payer: Medicare Other | Admitting: Family

## 2020-11-24 ENCOUNTER — Telehealth: Payer: Self-pay | Admitting: *Deleted

## 2020-11-24 VITALS — BP 113/84 | HR 115 | Temp 97.7°F | Resp 17 | Ht 67.0 in | Wt 143.0 lb

## 2020-11-24 DIAGNOSIS — C3490 Malignant neoplasm of unspecified part of unspecified bronchus or lung: Secondary | ICD-10-CM | POA: Diagnosis not present

## 2020-11-24 DIAGNOSIS — C7931 Secondary malignant neoplasm of brain: Secondary | ICD-10-CM

## 2020-11-24 DIAGNOSIS — C4372 Malignant melanoma of left lower limb, including hip: Secondary | ICD-10-CM | POA: Diagnosis not present

## 2020-11-24 DIAGNOSIS — D649 Anemia, unspecified: Secondary | ICD-10-CM

## 2020-11-24 DIAGNOSIS — R197 Diarrhea, unspecified: Secondary | ICD-10-CM

## 2020-11-24 DIAGNOSIS — Z5111 Encounter for antineoplastic chemotherapy: Secondary | ICD-10-CM | POA: Diagnosis not present

## 2020-11-24 DIAGNOSIS — C787 Secondary malignant neoplasm of liver and intrahepatic bile duct: Secondary | ICD-10-CM | POA: Diagnosis not present

## 2020-11-24 DIAGNOSIS — C3431 Malignant neoplasm of lower lobe, right bronchus or lung: Secondary | ICD-10-CM | POA: Diagnosis not present

## 2020-11-24 LAB — CBC WITH DIFFERENTIAL (CANCER CENTER ONLY)
Abs Immature Granulocytes: 0.11 10*3/uL — ABNORMAL HIGH (ref 0.00–0.07)
Basophils Absolute: 0 10*3/uL (ref 0.0–0.1)
Basophils Relative: 0 %
Eosinophils Absolute: 0.1 10*3/uL (ref 0.0–0.5)
Eosinophils Relative: 1 %
HCT: 24 % — ABNORMAL LOW (ref 39.0–52.0)
Hemoglobin: 8.5 g/dL — ABNORMAL LOW (ref 13.0–17.0)
Immature Granulocytes: 2 %
Lymphocytes Relative: 34 %
Lymphs Abs: 1.9 10*3/uL (ref 0.7–4.0)
MCH: 34.4 pg — ABNORMAL HIGH (ref 26.0–34.0)
MCHC: 35.4 g/dL (ref 30.0–36.0)
MCV: 97.2 fL (ref 80.0–100.0)
Monocytes Absolute: 0.9 10*3/uL (ref 0.1–1.0)
Monocytes Relative: 15 %
Neutro Abs: 2.6 10*3/uL (ref 1.7–7.7)
Neutrophils Relative %: 48 %
Platelet Count: 97 10*3/uL — ABNORMAL LOW (ref 150–400)
RBC: 2.47 MIL/uL — ABNORMAL LOW (ref 4.22–5.81)
RDW: 18.6 % — ABNORMAL HIGH (ref 11.5–15.5)
WBC Count: 5.5 10*3/uL (ref 4.0–10.5)
nRBC: 0 % (ref 0.0–0.2)

## 2020-11-24 LAB — CMP (CANCER CENTER ONLY)
ALT: 7 U/L (ref 0–44)
AST: 13 U/L — ABNORMAL LOW (ref 15–41)
Albumin: 4 g/dL (ref 3.5–5.0)
Alkaline Phosphatase: 97 U/L (ref 38–126)
Anion gap: 9 (ref 5–15)
BUN: 11 mg/dL (ref 8–23)
CO2: 25 mmol/L (ref 22–32)
Calcium: 8.9 mg/dL (ref 8.9–10.3)
Chloride: 96 mmol/L — ABNORMAL LOW (ref 98–111)
Creatinine: 1.53 mg/dL — ABNORMAL HIGH (ref 0.61–1.24)
GFR, Estimated: 48 mL/min — ABNORMAL LOW (ref >60)
Glucose, Bld: 115 mg/dL — ABNORMAL HIGH (ref 70–99)
Potassium: 3.8 mmol/L (ref 3.5–5.1)
Sodium: 130 mmol/L — ABNORMAL LOW (ref 135–145)
Total Bilirubin: 0.4 mg/dL (ref 0.3–1.2)
Total Protein: 6.3 g/dL — ABNORMAL LOW (ref 6.5–8.1)

## 2020-11-24 LAB — LACTATE DEHYDROGENASE: LDH: 194 U/L — ABNORMAL HIGH (ref 98–192)

## 2020-11-24 LAB — PREPARE RBC (CROSSMATCH)

## 2020-11-24 LAB — MAGNESIUM: Magnesium: 0.8 mg/dL — CL (ref 1.7–2.4)

## 2020-11-24 LAB — SAMPLE TO BLOOD BANK

## 2020-11-24 MED ORDER — ACETAMINOPHEN 325 MG PO TABS
ORAL_TABLET | ORAL | Status: AC
  Filled 2020-11-24: qty 2

## 2020-11-24 MED ORDER — SODIUM CHLORIDE 0.9 % IV SOLN
Freq: Once | INTRAVENOUS | Status: AC
  Filled 2020-11-24: qty 250

## 2020-11-24 MED ORDER — ACETAMINOPHEN 325 MG PO TABS
650.0000 mg | ORAL_TABLET | Freq: Once | ORAL | Status: AC
  Administered 2020-11-24: 650 mg via ORAL

## 2020-11-24 MED ORDER — HEPARIN SOD (PORK) LOCK FLUSH 100 UNIT/ML IV SOLN
500.0000 [IU] | Freq: Once | INTRAVENOUS | Status: AC | PRN
  Administered 2020-11-24: 500 [IU]
  Filled 2020-11-24: qty 5

## 2020-11-24 MED ORDER — MAGNESIUM SULFATE 2 GM/50ML IV SOLN
2.0000 g | Freq: Once | INTRAVENOUS | Status: AC
  Administered 2020-11-24: 2 g via INTRAVENOUS
  Filled 2020-11-24: qty 50

## 2020-11-24 MED ORDER — SODIUM CHLORIDE 0.9% FLUSH
10.0000 mL | Freq: Once | INTRAVENOUS | Status: AC | PRN
  Administered 2020-11-24: 10 mL
  Filled 2020-11-24: qty 10

## 2020-11-24 MED ORDER — MAGNESIUM SULFATE 2 GM/50ML IV SOLN
2.0000 g | Freq: Once | INTRAVENOUS | Status: AC
Start: 2020-11-24 — End: 2020-11-24
  Administered 2020-11-24: 2 g via INTRAVENOUS
  Filled 2020-11-24: qty 50

## 2020-11-24 MED ORDER — METRONIDAZOLE 500 MG PO TABS: 500.0000 mg | ORAL_TABLET | Freq: Three times a day (TID) | ORAL | 0 refills | Status: AC

## 2020-11-24 NOTE — Patient Instructions (Signed)

## 2020-11-24 NOTE — Telephone Encounter (Signed)
Jory Ee NP notified of Magnesium-0.8. Orders received for pt to get IV Magnesium today per S. [place] NP.

## 2020-11-24 NOTE — Progress Notes (Signed)
Hematology and Oncology Follow Up Visit  Walter Hernandez [id number] 01-Aug-1950 70 y.o. 11/24/2020   Principle Diagnosis:  Stage IB ([id number]) superficial spreading melanoma of the left lower leg Small Cell Lung Cancer -- Extensive Stage -- lung/liver/brain mets   Past Therapy: Cranial XRT -- 06/19/2019 thru 07/02/2019 Carboplatin/VP-16/Tecentriq -- started on 07/07/2018, s/p cycle #4 Tecentriq - maintenance -- cycle#1 -- start on 10/01/2019 - d/c on 10/21/2019 Zepzelca/Keytruda -- q 3 wk --s/p cycle #6 - started on 10/28/2019 -- d/c on 03/15/2020   Current Therapy:        CDDP/Irinotecan -- s/p cycle 8 -- started on 03/23/2020   Interim History:  [name] is here today with his wife for follow-up. He is fatigued, weak, still having diarrhea, SOB with over exertion.  He still has lower back pain and weakness in his legs. He has a new patient appointment with Dr. Lynann Bologna for further eval.  He is dehydrated at this time, magnesium is low and he needs 2 units of blood.  No fever, chills, n/v, cough, rash, dizziness, SOB, chest pain, palpitations or changes in bladder habits at this time.  He still has frequent urination.  No swelling, numbness or tingling in his extremities.  No falls or syncope to report.  He does not have much of an appetite and is supplementing as needed with Boost and ensure.   ECOG Performance Status: 1 - Symptomatic but completely ambulatory  Medications:  Allergies as of 11/24/2020   No Known Allergies      Medication List        Accurate as of November 24, 2020  9:02 AM. If you have any questions, ask your nurse or doctor.          STOP taking these medications    lidocaine-prilocaine cream Commonly known as: EMLA   LORazepam 0.5 MG tablet Commonly known as: Ativan   prochlorperazine 10 MG tablet Commonly known as: COMPAZINE       TAKE these medications    acetaminophen 500 MG tablet Commonly known as: TYLENOL Take 1,000 mg by mouth every 6  (six) hours as needed for moderate pain.   ALPRAZolam 0.5 MG tablet Commonly known as: XANAX Take 1 tablet (0.5 mg total) by mouth every 6 (six) hours as needed for anxiety.   dexamethasone 4 MG tablet Commonly known as: DECADRON Dexamethasone 4 mg TID x 2 days, BID x 2 days, then 2 mg (1/2 tab) BID x 7 days, then 2 mg (1/2 tab) QD x 7 days then stop.   dexamethasone 4 MG tablet Commonly known as: DECADRON Take 2 tablets (8 mg total) by mouth 2 (two) times daily. You must take with food in the morning and at dinner.   diphenoxylate-atropine 2.5-0.025 MG tablet Commonly known as: LOMOTIL TAKE 2 TABLETS AT ONSET OF DIARRHEA. TAKE 1 TABLET AFTER EACH LOOSE STOOL. MAX 8 TABLETS PER DAY.   dronabinol 5 MG capsule Commonly known as: MARINOL Take 1 capsule (5 mg total) by mouth 2 (two) times daily before a meal.   finasteride 5 MG tablet Commonly known as: PROSCAR TAKE 1 TABLET BY MOUTH EVERY DAY   furosemide 20 MG tablet Commonly known as: LASIX Take 2 tablets (40 mg total) by mouth daily.   levETIRAcetam 500 MG tablet Commonly known as: Keppra Take 1 tablet (500 mg total) by mouth 2 (two) times daily.   levETIRAcetam 500 MG tablet Commonly known as: KEPPRA Take 1 tablet (500 mg total) by mouth 2 (two) times  daily.   loperamide 2 MG capsule Commonly known as: IMODIUM   multivitamin capsule Take 1 capsule by mouth daily.   omeprazole 40 MG capsule Commonly known as: PRILOSEC Take 1 capsule (40 mg total) by mouth 2 (two) times daily. Please call [phone] to schedule an office visit for more refills.   ondansetron 4 MG tablet Commonly known as: ZOFRAN Take 1 tablet (4 mg total) by mouth every 6 (six) hours as needed for nausea. What changed: Another medication with the same name was removed. Continue taking this medication, and follow the directions you see here.   ondansetron 8 MG disintegrating tablet Commonly known as: ZOFRAN-ODT Take 8 mg by mouth.   OPCON-A  OP Place 2 drops into both eyes as needed (for dry eyes).   tamsulosin 0.4 MG Caps capsule Commonly known as: FLOMAX Take 1 capsule (0.4 mg total) by mouth daily.   traMADol 50 MG tablet Commonly known as: ULTRAM Take 1 tablet (50 mg total) by mouth every 6 (six) hours as needed.        Allergies: No Known Allergies  Past Medical History, Surgical history, Social history, and Family History were reviewed and updated.  Review of Systems: All other 10 point review of systems is negative.   Physical Exam:  height is 5\' 7"  (1.702 m) and weight is 143 lb (64.9 kg). His oral temperature is 97.7 F (36.5 C). His blood pressure is 113/84 and his pulse is 115 (abnormal). His respiration is 17 and oxygen saturation is 100%.   Wt Readings from Last 3 Encounters:  11/24/20 143 lb (64.9 kg)  11/11/20 151 lb (68.5 kg)  11/09/20 150 lb (68 kg)    Ocular: Sclerae unicteric, pupils equal, round and reactive to light Ear-nose-throat: Oropharynx clear, dentition fair Lymphatic: No cervical or supraclavicular adenopathy Lungs no rales or rhonchi, good excursion bilaterally Heart regular rate and rhythm, no murmur appreciated Abd soft, nontender, positive bowel sounds MSK no focal spinal tenderness, no joint edema Neuro: non-focal, well-oriented, appropriate affect Breasts: Deferred   Lab Results  Component Value Date   WBC 5.5 11/24/2020   HGB 8.5 (L) 11/24/2020   HCT 24.0 (L) 11/24/2020   MCV 97.2 11/24/2020   PLT 97 (L) 11/24/2020   Lab Results  Component Value Date   FERRITIN 3,494 (H) 11/05/2020   IRON 115 11/05/2020   TIBC 195 (L) 11/05/2020   UIBC 80 (L) 11/05/2020   IRONPCTSAT 59 (H) 11/05/2020   Lab Results  Component Value Date   RETICCTPCT 1.5 08/07/2019   RBC 2.47 (L) 11/24/2020   No results found for: KPAFRELGTCHN, LAMBDASER, KAPLAMBRATIO No results found for: IGGSERUM, IGA, IGMSERUM No results found for: Will Bonnet,  GAMS, MSPIKE, SPEI   Chemistry      Component Value Date/Time   NA 130 (L) 11/24/2020 0830   NA 142 01/30/2017 0804   K 3.8 11/24/2020 0830   K 4.1 01/30/2017 0804   CL 96 (L) 11/24/2020 0830   CL 105 01/25/2016 0926   CO2 25 11/24/2020 0830   CO2 29 01/30/2017 0804   BUN 11 11/24/2020 0830   BUN 6.9 (L) 01/30/2017 0804   CREATININE 1.53 (H) 11/24/2020 0830   CREATININE 0.8 01/30/2017 0804      Component Value Date/Time   CALCIUM 8.9 11/24/2020 0830   CALCIUM 10.5 (H) 01/30/2017 0804   ALKPHOS 97 11/24/2020 0830   ALKPHOS 79 01/30/2017 0804   AST 13 (L) 11/24/2020 0830  AST 34 01/30/2017 0804   ALT 7 11/24/2020 0830   ALT 18 01/30/2017 0804   BILITOT 0.4 11/24/2020 0830   BILITOT 1.49 (H) 01/30/2017 0804       Impression and Plan: Mr. Klaiber is a very pleasant 70 yo caucasian gentleman with history of stage Ib melanoma of the left lower leg and extensive stage metastatic small cell lung cancer. He is still having diarrhea so we will have him start Flagyl TID for 7 days.  We will give IV fluids and 4 gm IV magnesium today.  He will get 2 units of blood tomorrow.  He sees Dr. Lynann Bologna Friday.  His wife plans to contact palliative care as they have been playing phone tag. Follow-up in 2 weeks.  They can contact our office with any questions or concerns.   Laverna Peace, NP 6/21/20229:02 AM

## 2020-11-24 NOTE — Progress Notes (Signed)
Visited with patient and his wife, Walter Hernandez. Patient is doing about the same. Still weak, limited ability to ambulate, continued issues with bowels and bladder. He is no longer going to be treated, but they are not ready for hospice. Palliative Care referral has been placed. Walter Hernandez, who is doing well after her fractured wrist/surgery, speaks about her concern about [name]'s CNS symptoms and what role chemo may have played. Explained that in addition to multiple chemo regimens, he has also had several rounds of SBRT with brains mets. She asked how many months after treatment they can expect the CNS symptoms to resolve. Had lengthy conversation with them about general expectations and symptom management.   Encouraged Walter Hernandez to make sure she was getting rest and support she needed. She knows to reach out to the office with any questions or concerns.   Oncology Nurse Navigator Documentation  Oncology Nurse Navigator Flowsheets 11/24/2020  Abnormal Finding Date -  Confirmed Diagnosis Date -  Diagnosis Status -  Planned Course of Treatment -  Phase of Treatment -  Chemotherapy Actual Start Date: -  Radiation Actual Start Date: -  Navigator Follow Up Date: 12/09/2020  Navigator Follow Up Reason: Follow-up Appointment  Navigator Location [facility]  Navigator Encounter Type Follow-up Appt  Telephone -  Treatment Initiated Date -  Patient Visit Type MedOnc  Treatment Phase Other  Barriers/Navigation Needs Coordination of Care;Education  Education Other;Pain/ Symptom Management  Interventions Education;Psycho-Social Support  Acuity Level 2-Minimal Needs (1-2 Barriers Identified)  Referrals -  Coordination of Care -  Education Method Verbal  Support Groups/Services Friends and Family  Time Spent with Patient 18

## 2020-11-24 NOTE — Patient Instructions (Signed)
Dehydration, Adult Dehydration is condition in which there is not enough water or other fluids in the body. This happens when a person loses more fluids than he or she takes in. Important body parts cannot work right without the right amount of fluids. Anyloss of fluids from the body can cause dehydration. Dehydration can be mild, worse, or very bad. It should be treated right away tokeep it from getting very bad. What are the causes? This condition may be caused by: Conditions that cause loss of water or other fluids, such as: Watery poop (diarrhea). Vomiting. Sweating a lot. Peeing (urinating) a lot. Not drinking enough fluids, especially when you: Are ill. Are doing things that take a lot of energy to do. Other illnesses and conditions, such as fever or infection. Certain medicines, such as medicines that take extra fluid out of the body (diuretics). Lack of safe drinking water. Not being able to get enough water and food. What increases the risk? The following factors may make you more likely to develop this condition: Having a long-term (chronic) illness that has not been treated the right way, such as: Diabetes. Heart disease. Kidney disease. Being 72 years of age or older. Having a disability. Living in a place that is high above the ground or sea (high in altitude). The thinner, dried air causes more fluid loss. Doing exercises that put stress on your body for a long time. What are the signs or symptoms? Symptoms of dehydration depend on how bad it is. Mild or worse dehydration Thirst. Dry lips or dry mouth. Feeling dizzy or light-headed, especially when you stand up from sitting. Muscle cramps. Your body making: Dark pee (urine). Pee may be the color of tea. Less pee than normal. Less tears than normal. Headache. Very bad dehydration Changes in skin. Skin may: Be cold to the touch (clammy). Be blotchy or pale. Not go back to normal right after you lightly pinch it  and let it go. Little or no tears, pee, or sweat. Changes in vital signs, such as: Fast breathing. Low blood pressure. Weak pulse. Pulse that is more than 100 beats a minute when you are sitting still. Other changes, such as: Feeling very thirsty. Eyes that look hollow (sunken). Cold hands and feet. Being mixed up (confused). Being very tired (lethargic) or having trouble waking from sleep. Short-term weight loss. Loss of consciousness. How is this treated? Treatment for this condition depends on how bad it is. Treatment should start right away. Do not wait until your condition gets very bad. Very bad dehydration is an emergency. You will need to go to a hospital. Mild or worse dehydration can be treated at home. You may be asked to: Drink more fluids. Drink an oral rehydration solution (ORS). This drink helps get the right amounts of fluids and salts and minerals in the blood (electrolytes). Very bad dehydration can be treated: With fluids through an IV tube. By getting normal levels of salts and minerals in your blood. This is often done by giving salts and minerals through a tube. The tube is passed through your nose and into your stomach. By treating the root cause. Follow these instructions at home: Oral rehydration solution If told by your doctor, drink an ORS: Make an ORS. Use instructions on the package. Start by drinking small amounts, about  cup (120 mL) every 5-10 minutes. Slowly drink more until you have had the amount that your doctor said to have. Eating and drinking  Drink enough clear fluid to keep your pee pale yellow. If you were told to drink an ORS, finish the ORS first. Then, start slowly drinking other clear fluids. Drink fluids such as: Water. Do not drink only water. Doing that can make the salt (sodium) level in your body get too low. Water from ice chips you suck on. Fruit juice that you have added water to (diluted). Low-calorie sports  drinks. Eat foods that have the right amounts of salts and minerals, such as: Bananas. Oranges. Potatoes. Tomatoes. Spinach. Do not drink alcohol. Avoid: Drinks that have a lot of sugar. These include: High-calorie sports drinks. Fruit juice that you did not add water to. Soda. Caffeine. Foods that are greasy or have a lot of fat or sugar. General instructions Take over-the-counter and prescription medicines only as told by your doctor. Do not take salt tablets. Doing that can make the salt level in your body get too high. Return to your normal activities as told by your doctor. Ask your doctor what activities are safe for you. Keep all follow-up visits as told by your doctor. This is important. Contact a doctor if: You have pain in your belly (abdomen) and the pain: Gets worse. Stays in one place. You have a rash. You have a stiff neck. You get angry or annoyed (irritable) more easily than normal. You are more tired or have a harder time waking than normal. You feel: Weak or dizzy. Very thirsty. Get help right away if you have: Any symptoms of very bad dehydration. Symptoms of vomiting, such as: You cannot eat or drink without vomiting. Your vomiting gets worse or does not go away. Your vomit has blood or green stuff in it. Symptoms that get worse with treatment. A fever. A very bad headache. Problems with peeing or pooping (having a bowel movement), such as: Watery poop that gets worse or does not go away. Blood in your poop (stool). This may cause poop to look black and tarry. Not peeing in 6-8 hours. Peeing only a small amount of very dark pee in 6-8 hours. Trouble breathing. These symptoms may be an emergency. Do not wait to see if the symptoms will go away. Get medical help right away. Call your local emergency services (911 in the [place] Do not drive yourself to the hospital. Summary Dehydration is a condition in which there is not enough water or other fluids  in the body. This happens when a person loses more fluids than he or she takes in. Treatment for this condition depends on how bad it is. Treatment should be started right away. Do not wait until your condition gets very bad. Drink enough clear fluid to keep your pee pale yellow. If you were told to drink an oral rehydration solution (ORS), finish the ORS first. Then, start slowly drinking other clear fluids. Take over-the-counter and prescription medicines only as told by your doctor. Get help right away if you have any symptoms of very bad dehydration. This information is not intended to replace advice given to you by your health care provider. Make sure you discuss any questions you have with your healthcare provider. Document Revised: 01/03/2019 Document Reviewed: 01/03/2019 Elsevier Patient Education  [place]. Magnesium Sulfate Injection What is this medication? MAGNESIUM SULFATE (mag NEE zee um SUL fate) prevents and treats low levels of magnesium in your body. It may also be used to prevent and treat seizures during pregnancy in people with high blood pressure disorders, such as preeclampsia  or eclampsia. Magnesium plays an important role in maintaining thehealth of your muscles and nervous system. This medicine may be used for other purposes; ask your health care provider orpharmacist if you have questions. What should I tell my care team before I take this medication? They need to know if you have any of these conditions: Heart disease History of irregular heart beat Kidney disease An unusual or allergic reaction to magnesium sulfate, medications, foods, dyes, or preservatives Pregnant or trying to get pregnant Breast-feeding How should I use this medication? This medication is for infusion into a vein. It is given in a hospital orclinic setting. Talk to your care team about the use of this medication in children. While thismedication may be prescribed for selected  conditions, precautions do apply. Overdosage: If you think you have taken too much of this medicine contact apoison control center or emergency room at once. NOTE: This medicine is only for you. Do not share this medicine with others. What if I miss a dose? This does not apply. What may interact with this medication? Certain medications for anxiety or sleep Certain medications for seizures like phenobarbital Digoxin Medications that relax muscles for surgery Narcotic medications for pain This list may not describe all possible interactions. Give your health care provider a list of all the medicines, herbs, non-prescription drugs, or dietary supplements you use. Also tell them if you smoke, drink alcohol, or use illegaldrugs. Some items may interact with your medicine. What should I watch for while using this medication? Your condition will be monitored carefully while you are receiving this medication. You may need blood work done while you are receiving thismedication. What side effects may I notice from receiving this medication? Side effects that you should report to your care team as soon as possible: Allergic reactions-skin rash, itching, hives, swelling of the face, lips, tongue, or throat High magnesium level-confusion, drowsiness, facial flushing, redness, sweating, muscle weakness, fast or irregular heartbeat, trouble breathing Low blood pressure-dizziness, feeling faint or lightheaded, blurry vision Side effects that usually do not require medical attention (report to your careteam if they continue or are bothersome): Headache Nausea This list may not describe all possible side effects. Call your doctor for medical advice about side effects. You may report side effects to FDA at1-800-FDA-1088. Where should I keep my medication? This medication is given in a hospital or clinic and will not be stored at home. NOTE: This sheet is a summary. It may not cover all possible information.  If you have questions about this medicine, talk to your doctor, pharmacist, orhealth care provider.  2022 Elsevier/Gold Standard (2020-07-13 13:10:26)

## 2020-11-25 ENCOUNTER — Inpatient Hospital Stay: Payer: Medicare Other

## 2020-11-25 DIAGNOSIS — C3490 Malignant neoplasm of unspecified part of unspecified bronchus or lung: Secondary | ICD-10-CM

## 2020-11-25 DIAGNOSIS — C787 Secondary malignant neoplasm of liver and intrahepatic bile duct: Secondary | ICD-10-CM | POA: Diagnosis not present

## 2020-11-25 DIAGNOSIS — C4372 Malignant melanoma of left lower limb, including hip: Secondary | ICD-10-CM | POA: Diagnosis not present

## 2020-11-25 DIAGNOSIS — C7931 Secondary malignant neoplasm of brain: Secondary | ICD-10-CM | POA: Diagnosis not present

## 2020-11-25 DIAGNOSIS — R197 Diarrhea, unspecified: Secondary | ICD-10-CM | POA: Diagnosis not present

## 2020-11-25 DIAGNOSIS — C3431 Malignant neoplasm of lower lobe, right bronchus or lung: Secondary | ICD-10-CM | POA: Diagnosis not present

## 2020-11-25 DIAGNOSIS — Z5111 Encounter for antineoplastic chemotherapy: Secondary | ICD-10-CM | POA: Diagnosis not present

## 2020-11-25 DIAGNOSIS — D649 Anemia, unspecified: Secondary | ICD-10-CM

## 2020-11-25 MED ORDER — FUROSEMIDE 10 MG/ML IJ SOLN: 20.0000 mg | Freq: Once | INTRAMUSCULAR | Status: DC

## 2020-11-25 MED ORDER — SODIUM CHLORIDE 0.9% FLUSH
10.0000 mL | INTRAVENOUS | Status: AC | PRN
Start: 2020-11-25 — End: 2020-11-25
  Administered 2020-11-25: 10 mL
  Filled 2020-11-25: qty 10

## 2020-11-25 MED ORDER — DIPHENHYDRAMINE HCL 25 MG PO CAPS: 25.0000 mg | ORAL_CAPSULE | Freq: Once | ORAL | Status: DC

## 2020-11-25 MED ORDER — HEPARIN SOD (PORK) LOCK FLUSH 100 UNIT/ML IV SOLN
500.0000 [IU] | Freq: Every day | INTRAVENOUS | Status: AC | PRN
  Administered 2020-11-25: 500 [IU]
  Filled 2020-11-25: qty 5

## 2020-11-25 MED ORDER — ACETAMINOPHEN 325 MG PO TABS: 650.0000 mg | ORAL_TABLET | Freq: Once | ORAL | Status: DC

## 2020-11-25 MED ORDER — SODIUM CHLORIDE 0.9% IV SOLUTION
250.0000 mL | Freq: Once | INTRAVENOUS | Status: AC
  Administered 2020-11-25: 250 mL via INTRAVENOUS
  Filled 2020-11-25: qty 250

## 2020-11-25 NOTE — Patient Instructions (Signed)
https://www.[facility]cross[facility].org/donate-blood/blood-donation-process/what-happens-to-donated-blood/blood-transfusions/types-of-blood-transfusions.html"> https://www.hematology.org/education/patients/blood-basics/blood-safety-and-matching"> https://www.nhlbi.nih.gov/health-topics/blood-transfusion">  Blood Transfusion, Adult A blood transfusion is a procedure in which you receive blood or a type of blood cell (blood component) through an IV. You may need a blood transfusion when your blood level is low. This may result from a bleeding disorder, illness, injury, or surgery. The blood may come from a donor. You may also be able to donate blood for yourself (autologous blood donation) before a planned surgery. The blood given in a transfusion is made up of different blood components. You may receive: Red blood cells. These carry oxygen to the cells in the body. Platelets. These help your blood to clot. Plasma. This is the liquid part of your blood. It carries proteins and other substances throughout the body. White blood cells. These help you fight infections. If you have hemophilia or another clotting disorder, you may also receive othertypes of blood products. Tell a health care provider about: Any blood disorders you have. Any previous reactions you have had during a blood transfusion. Any allergies you have. All medicines you are taking, including vitamins, herbs, eye drops, creams, and over-the-counter medicines. Any surgeries you have had. Any medical conditions you have, including any recent fever or cold symptoms. Whether you are pregnant or may be pregnant. What are the risks? Generally, this is a safe procedure. However, problems may occur. The most common problems include: A mild allergic reaction, such as red, swollen areas of skin (hives) and itching. Fever or chills. This may be the body's response to new blood cells received. This may occur during or up to 4 hours after the  transfusion. More serious problems may include: Transfusion-associated circulatory overload (TACO), or too much fluid in the lungs. This may cause breathing problems. A serious allergic reaction, such as difficulty breathing or swelling around the face and lips. Transfusion-related acute lung injury (TRALI), which causes breathing difficulty and low oxygen in the blood. This can occur within hours of the transfusion or several days later. Iron overload. This can happen after receiving many blood transfusions over a period of time. Infection or virus being transmitted. This is rare because donated blood is carefully tested before it is given. Hemolytic transfusion reaction. This is rare. It happens when your body's defense system (immune system)tries to attack the new blood cells. Symptoms may include fever, chills, nausea, low blood pressure, and low back or chest pain. Transfusion-associated graft-versus-host disease (TAGVHD). This is rare. It happens when donated cells attack your body's healthy tissues. What happens before the procedure? Medicines Ask your health care provider about: Changing or stopping your regular medicines. This is especially important if you are taking diabetes medicines or blood thinners. Taking medicines such as aspirin and ibuprofen. These medicines can thin your blood. Do not take these medicines unless your health care provider tells you to take them. Taking over-the-counter medicines, vitamins, herbs, and supplements. General instructions Follow instructions from your health care provider about eating and drinking restrictions. You will have a blood test to determine your blood type. This is necessary to know what kind of blood your body will accept and to match it to the donor blood. If you are going to have a planned surgery, you may be able to do an autologous blood donation. This may be done in case you need to have a transfusion. You will have your temperature,  blood pressure, and pulse monitored before the transfusion. If you have had an allergic reaction to a transfusion in the past, you may be given   medicine to help prevent a reaction. This medicine may be given to you by mouth (orally) or through an IV. Set aside time for the blood transfusion. This procedure generally takes 1-4 hours to complete. What happens during the procedure?  An IV will be inserted into one of your veins. The bag of donated blood will be attached to your IV. The blood will then enter through your vein. Your temperature, blood pressure, and pulse will be monitored regularly during the transfusion. This monitoring is done to detect early signs of a transfusion reaction. Tell your nurse right away if you have any of these symptoms during the transfusion: Shortness of breath or trouble breathing. Chest or back pain. Fever or chills. Hives or itching. If you have any signs or symptoms of a reaction, your transfusion will be stopped and you may be given medicine. When the transfusion is complete, your IV will be removed. Pressure may be applied to the IV site for a few minutes. A bandage (dressing)will be applied. The procedure may vary among health care providers and hospitals. What happens after the procedure? Your temperature, blood pressure, pulse, breathing rate, and blood oxygen level will be monitored until you leave the hospital or clinic. Your blood may be tested to see how you are responding to the transfusion. You may be warmed with fluids or blankets to maintain a normal body temperature. If you receive your blood transfusion in an outpatient setting, you will be told whom to contact to report any reactions. Where to find more information For more information on blood transfusions, visit the [employer]: redcross.org Summary A blood transfusion is a procedure in which you receive blood or a type of blood cell (blood component) through an IV. The blood you  receive may come from a donor or be donated by yourself (autologous blood donation) before a planned surgery. The blood given in a transfusion is made up of different blood components. You may receive red blood cells, platelets, plasma, or white blood cells depending on the condition treated. Your temperature, blood pressure, and pulse will be monitored before, during, and after the transfusion. After the transfusion, your blood may be tested to see how your body has responded. This information is not intended to replace advice given to you by your health care provider. Make sure you discuss any questions you have with your healthcare provider. Document Revised: 03/28/2019 Document Reviewed: 11/15/2018 Elsevier Patient Education  2022 [employer].  

## 2020-11-26 ENCOUNTER — Telehealth: Payer: Self-pay

## 2020-11-26 LAB — TYPE AND SCREEN
ABO/RH(D): O POS
Antibody Screen: NEGATIVE
Unit division: 0
Unit division: 0

## 2020-11-26 LAB — BPAM RBC
Blood Product Expiration Date: 202207252359
Blood Product Expiration Date: 202207252359
ISSUE DATE / TIME: 202206220751
ISSUE DATE / TIME: 202206220751
Unit Type and Rh: 5100
Unit Type and Rh: 5100

## 2020-11-26 NOTE — Telephone Encounter (Signed)
Third attempt to contact patient to schedule a Palliative Care consult appointment. No answer left a message to return call by 11/27/20 if possible. If no return call by 11/27/20 will cancel referral and notify PCP/ordering MD.

## 2020-11-30 ENCOUNTER — Telehealth: Payer: Self-pay | Admitting: Nurse Practitioner

## 2020-11-30 NOTE — Telephone Encounter (Signed)
Rec'd return call from patient's wife, Walter Hernandez, regarding the Palliative referral.  After a lengthy discussion about Palliative services and all questions were answered wife said that she was going to reach out to Dr. Marin Olp about trying to get a wheelchair and home health services for the patient. Wife also stated that patient has a f/u visit with Dr. Marin Olp on 12/09/20 and she said that we could call her back on 12/10/20 to f/u with her at that time to see if she needed to schedule a visit with [place].  I also asked wife to call us back before 12/10/20 if she found out that she did not want Palliative services, so that we could go ahead and cancel the referral and she was in agreement with this.

## 2020-12-01 ENCOUNTER — Other Ambulatory Visit: Payer: Self-pay

## 2020-12-01 ENCOUNTER — Emergency Department (HOSPITAL_COMMUNITY): Payer: Medicare Other

## 2020-12-01 ENCOUNTER — Encounter (HOSPITAL_COMMUNITY): Payer: Self-pay

## 2020-12-01 ENCOUNTER — Inpatient Hospital Stay (HOSPITAL_COMMUNITY): Payer: Medicare Other

## 2020-12-01 ENCOUNTER — Inpatient Hospital Stay (HOSPITAL_COMMUNITY)
Admission: EM | Admit: 2020-12-01 | Discharge: 2020-12-17 | DRG: 100 | Disposition: A | Discharge status: Hospice | Payer: Medicare Other | Attending: Internal Medicine | Admitting: Internal Medicine

## 2020-12-01 DIAGNOSIS — I129 Hypertensive chronic kidney disease with stage 1 through stage 4 chronic kidney disease, or unspecified chronic kidney disease: Secondary | ICD-10-CM | POA: Diagnosis present

## 2020-12-01 DIAGNOSIS — G936 Cerebral edema: Secondary | ICD-10-CM | POA: Diagnosis not present

## 2020-12-01 DIAGNOSIS — N179 Acute kidney failure, unspecified: Secondary | ICD-10-CM | POA: Diagnosis present

## 2020-12-01 DIAGNOSIS — N1831 Chronic kidney disease, stage 3a: Secondary | ICD-10-CM | POA: Diagnosis present

## 2020-12-01 DIAGNOSIS — E878 Other disorders of electrolyte and fluid balance, not elsewhere classified: Secondary | ICD-10-CM

## 2020-12-01 DIAGNOSIS — R29810 Facial weakness: Secondary | ICD-10-CM | POA: Diagnosis not present

## 2020-12-01 DIAGNOSIS — R569 Unspecified convulsions: Secondary | ICD-10-CM | POA: Diagnosis not present

## 2020-12-01 DIAGNOSIS — I959 Hypotension, unspecified: Secondary | ICD-10-CM | POA: Diagnosis not present

## 2020-12-01 DIAGNOSIS — G40901 Epilepsy, unspecified, not intractable, with status epilepticus: Secondary | ICD-10-CM | POA: Diagnosis present

## 2020-12-01 DIAGNOSIS — N400 Enlarged prostate without lower urinary tract symptoms: Secondary | ICD-10-CM | POA: Diagnosis present

## 2020-12-01 DIAGNOSIS — R069 Unspecified abnormalities of breathing: Secondary | ICD-10-CM

## 2020-12-01 DIAGNOSIS — Z515 Encounter for palliative care: Secondary | ICD-10-CM | POA: Diagnosis not present

## 2020-12-01 DIAGNOSIS — R404 Transient alteration of awareness: Secondary | ICD-10-CM | POA: Diagnosis not present

## 2020-12-01 DIAGNOSIS — C787 Secondary malignant neoplasm of liver and intrahepatic bile duct: Secondary | ICD-10-CM | POA: Diagnosis present

## 2020-12-01 DIAGNOSIS — D6959 Other secondary thrombocytopenia: Secondary | ICD-10-CM | POA: Diagnosis present

## 2020-12-01 DIAGNOSIS — C3491 Malignant neoplasm of unspecified part of right bronchus or lung: Secondary | ICD-10-CM

## 2020-12-01 DIAGNOSIS — G9389 Other specified disorders of brain: Secondary | ICD-10-CM | POA: Diagnosis not present

## 2020-12-01 DIAGNOSIS — J9811 Atelectasis: Secondary | ICD-10-CM | POA: Diagnosis not present

## 2020-12-01 DIAGNOSIS — C4372 Malignant melanoma of left lower limb, including hip: Secondary | ICD-10-CM

## 2020-12-01 DIAGNOSIS — Z4659 Encounter for fitting and adjustment of other gastrointestinal appliance and device: Secondary | ICD-10-CM

## 2020-12-01 DIAGNOSIS — G9341 Metabolic encephalopathy: Secondary | ICD-10-CM | POA: Diagnosis not present

## 2020-12-01 DIAGNOSIS — T451X5A Adverse effect of antineoplastic and immunosuppressive drugs, initial encounter: Secondary | ICD-10-CM | POA: Diagnosis present

## 2020-12-01 DIAGNOSIS — N189 Chronic kidney disease, unspecified: Secondary | ICD-10-CM | POA: Diagnosis not present

## 2020-12-01 DIAGNOSIS — R5381 Other malaise: Secondary | ICD-10-CM | POA: Diagnosis not present

## 2020-12-01 DIAGNOSIS — Z87891 Personal history of nicotine dependence: Secondary | ICD-10-CM

## 2020-12-01 DIAGNOSIS — C3490 Malignant neoplasm of unspecified part of unspecified bronchus or lung: Secondary | ICD-10-CM | POA: Diagnosis not present

## 2020-12-01 DIAGNOSIS — Z85820 Personal history of malignant melanoma of skin: Secondary | ICD-10-CM

## 2020-12-01 DIAGNOSIS — J9601 Acute respiratory failure with hypoxia: Secondary | ICD-10-CM

## 2020-12-01 DIAGNOSIS — D696 Thrombocytopenia, unspecified: Secondary | ICD-10-CM | POA: Diagnosis not present

## 2020-12-01 DIAGNOSIS — G9349 Other encephalopathy: Secondary | ICD-10-CM | POA: Diagnosis present

## 2020-12-01 DIAGNOSIS — K219 Gastro-esophageal reflux disease without esophagitis: Secondary | ICD-10-CM | POA: Diagnosis present

## 2020-12-01 DIAGNOSIS — Z7401 Bed confinement status: Secondary | ICD-10-CM

## 2020-12-01 DIAGNOSIS — Z452 Encounter for adjustment and management of vascular access device: Secondary | ICD-10-CM | POA: Diagnosis not present

## 2020-12-01 DIAGNOSIS — I10 Essential (primary) hypertension: Secondary | ICD-10-CM | POA: Diagnosis present

## 2020-12-01 DIAGNOSIS — D6481 Anemia due to antineoplastic chemotherapy: Secondary | ICD-10-CM | POA: Diagnosis present

## 2020-12-01 DIAGNOSIS — Z7189 Other specified counseling: Secondary | ICD-10-CM | POA: Diagnosis not present

## 2020-12-01 DIAGNOSIS — E222 Syndrome of inappropriate secretion of antidiuretic hormone: Secondary | ICD-10-CM | POA: Diagnosis present

## 2020-12-01 DIAGNOSIS — I6523 Occlusion and stenosis of bilateral carotid arteries: Secondary | ICD-10-CM | POA: Diagnosis not present

## 2020-12-01 DIAGNOSIS — Z923 Personal history of irradiation: Secondary | ICD-10-CM

## 2020-12-01 DIAGNOSIS — Z79899 Other long term (current) drug therapy: Secondary | ICD-10-CM

## 2020-12-01 DIAGNOSIS — R519 Headache, unspecified: Secondary | ICD-10-CM

## 2020-12-01 DIAGNOSIS — N183 Chronic kidney disease, stage 3 unspecified: Secondary | ICD-10-CM | POA: Diagnosis present

## 2020-12-01 DIAGNOSIS — Z4682 Encounter for fitting and adjustment of non-vascular catheter: Secondary | ICD-10-CM | POA: Diagnosis not present

## 2020-12-01 DIAGNOSIS — Z20822 Contact with and (suspected) exposure to covid-19: Secondary | ICD-10-CM | POA: Diagnosis present

## 2020-12-01 DIAGNOSIS — Z01818 Encounter for other preprocedural examination: Secondary | ICD-10-CM | POA: Insufficient documentation

## 2020-12-01 DIAGNOSIS — R29818 Other symptoms and signs involving the nervous system: Secondary | ICD-10-CM | POA: Diagnosis not present

## 2020-12-01 DIAGNOSIS — E639 Nutritional deficiency, unspecified: Secondary | ICD-10-CM | POA: Diagnosis present

## 2020-12-01 DIAGNOSIS — Z743 Need for continuous supervision: Secondary | ICD-10-CM | POA: Diagnosis not present

## 2020-12-01 DIAGNOSIS — J302 Other seasonal allergic rhinitis: Secondary | ICD-10-CM | POA: Diagnosis present

## 2020-12-01 DIAGNOSIS — R1310 Dysphagia, unspecified: Secondary | ICD-10-CM | POA: Diagnosis not present

## 2020-12-01 DIAGNOSIS — R4701 Aphasia: Secondary | ICD-10-CM | POA: Diagnosis present

## 2020-12-01 DIAGNOSIS — Z978 Presence of other specified devices: Secondary | ICD-10-CM

## 2020-12-01 DIAGNOSIS — Z9221 Personal history of antineoplastic chemotherapy: Secondary | ICD-10-CM

## 2020-12-01 DIAGNOSIS — K2100 Gastro-esophageal reflux disease with esophagitis, without bleeding: Secondary | ICD-10-CM | POA: Diagnosis not present

## 2020-12-01 DIAGNOSIS — C7931 Secondary malignant neoplasm of brain: Secondary | ICD-10-CM | POA: Diagnosis not present

## 2020-12-01 DIAGNOSIS — Z66 Do not resuscitate: Secondary | ICD-10-CM | POA: Diagnosis not present

## 2020-12-01 DIAGNOSIS — I4510 Unspecified right bundle-branch block: Secondary | ICD-10-CM | POA: Diagnosis not present

## 2020-12-01 DIAGNOSIS — D509 Iron deficiency anemia, unspecified: Secondary | ICD-10-CM | POA: Diagnosis not present

## 2020-12-01 DIAGNOSIS — R000 Tachycardia, unspecified: Secondary | ICD-10-CM | POA: Diagnosis not present

## 2020-12-01 DIAGNOSIS — G9340 Encephalopathy, unspecified: Secondary | ICD-10-CM | POA: Diagnosis not present

## 2020-12-01 DIAGNOSIS — F411 Generalized anxiety disorder: Secondary | ICD-10-CM

## 2020-12-01 DIAGNOSIS — R531 Weakness: Secondary | ICD-10-CM | POA: Diagnosis not present

## 2020-12-01 LAB — DIFFERENTIAL
Abs Immature Granulocytes: 0.26 10*3/uL — ABNORMAL HIGH (ref 0.00–0.07)
Basophils Absolute: 0 10*3/uL (ref 0.0–0.1)
Basophils Relative: 1 %
Eosinophils Absolute: 0.1 10*3/uL (ref 0.0–0.5)
Eosinophils Relative: 2 %
Immature Granulocytes: 4 %
Lymphocytes Relative: 34 %
Lymphs Abs: 2.1 10*3/uL (ref 0.7–4.0)
Monocytes Absolute: 1 10*3/uL (ref 0.1–1.0)
Monocytes Relative: 16 %
Neutro Abs: 2.7 10*3/uL (ref 1.7–7.7)
Neutrophils Relative %: 43 %

## 2020-12-01 LAB — COMPREHENSIVE METABOLIC PANEL
ALT: 11 U/L (ref 0–44)
AST: 19 U/L (ref 15–41)
Albumin: 3.4 g/dL — ABNORMAL LOW (ref 3.5–5.0)
Alkaline Phosphatase: 77 U/L (ref 38–126)
Anion gap: 12 (ref 5–15)
BUN: 7 mg/dL — ABNORMAL LOW (ref 8–23)
CO2: 25 mmol/L (ref 22–32)
Calcium: 9.1 mg/dL (ref 8.9–10.3)
Chloride: 97 mmol/L — ABNORMAL LOW (ref 98–111)
Creatinine, Ser: 1.69 mg/dL — ABNORMAL HIGH (ref 0.61–1.24)
GFR, Estimated: 43 mL/min — ABNORMAL LOW (ref >60)
Glucose, Bld: 120 mg/dL — ABNORMAL HIGH (ref 70–99)
Potassium: 4 mmol/L (ref 3.5–5.1)
Sodium: 134 mmol/L — ABNORMAL LOW (ref 135–145)
Total Bilirubin: 0.6 mg/dL (ref 0.3–1.2)
Total Protein: 5.8 g/dL — ABNORMAL LOW (ref 6.5–8.1)

## 2020-12-01 LAB — I-STAT CHEM 8, ED
BUN: 7 mg/dL — ABNORMAL LOW (ref 8–23)
Calcium, Ion: 1.22 mmol/L (ref 1.15–1.40)
Chloride: 98 mmol/L (ref 98–111)
Creatinine, Ser: 1.6 mg/dL — ABNORMAL HIGH (ref 0.61–1.24)
Glucose, Bld: 116 mg/dL — ABNORMAL HIGH (ref 70–99)
HCT: 31 % — ABNORMAL LOW (ref 39.0–52.0)
Hemoglobin: 10.5 g/dL — ABNORMAL LOW (ref 13.0–17.0)
Potassium: 4.1 mmol/L (ref 3.5–5.1)
Sodium: 134 mmol/L — ABNORMAL LOW (ref 135–145)
TCO2: 25 mmol/L (ref 22–32)

## 2020-12-01 LAB — POCT I-STAT 7, (LYTES, BLD GAS, ICA,H+H)
Acid-base deficit: 2 mmol/L (ref 0.0–2.0)
Bicarbonate: 23.7 mmol/L (ref 20.0–28.0)
Calcium, Ion: 1.21 mmol/L (ref 1.15–1.40)
HCT: 35 % — ABNORMAL LOW (ref 39.0–52.0)
Hemoglobin: 11.9 g/dL — ABNORMAL LOW (ref 13.0–17.0)
O2 Saturation: 99 %
Patient temperature: 97.8
Potassium: 4.4 mmol/L (ref 3.5–5.1)
Sodium: 130 mmol/L — ABNORMAL LOW (ref 135–145)
TCO2: 25 mmol/L (ref 22–32)
pCO2 arterial: 41.2 mmHg (ref 32.0–48.0)
pH, Arterial: 7.366 (ref 7.350–7.450)
pO2, Arterial: 162 mmHg — ABNORMAL HIGH (ref 83.0–108.0)

## 2020-12-01 LAB — CBC
HCT: 33 % — ABNORMAL LOW (ref 39.0–52.0)
Hemoglobin: 11.5 g/dL — ABNORMAL LOW (ref 13.0–17.0)
MCH: 33.5 pg (ref 26.0–34.0)
MCHC: 34.8 g/dL (ref 30.0–36.0)
MCV: 96.2 fL (ref 80.0–100.0)
Platelets: 104 10*3/uL — ABNORMAL LOW (ref 150–400)
RBC: 3.43 MIL/uL — ABNORMAL LOW (ref 4.22–5.81)
RDW: 17.8 % — ABNORMAL HIGH (ref 11.5–15.5)
WBC: 6.2 10*3/uL (ref 4.0–10.5)
nRBC: 0 % (ref 0.0–0.2)

## 2020-12-01 LAB — MRSA NEXT GEN BY PCR, NASAL: MRSA by PCR Next Gen: DETECTED — AB

## 2020-12-01 LAB — PROTIME-INR
INR: 1 (ref 0.8–1.2)
Prothrombin Time: 13.6 seconds (ref 11.4–15.2)

## 2020-12-01 LAB — SARS CORONAVIRUS 2 (TAT 6-24 HRS): SARS Coronavirus 2: NEGATIVE

## 2020-12-01 LAB — APTT: aPTT: 33 seconds (ref 24–36)

## 2020-12-01 LAB — CBG MONITORING, ED: Glucose-Capillary: 106 mg/dL — ABNORMAL HIGH (ref 70–99)

## 2020-12-01 LAB — PHENYTOIN LEVEL, TOTAL: Phenytoin Lvl: 22.8 ug/mL — ABNORMAL HIGH (ref 10.0–20.0)

## 2020-12-01 IMAGING — CT CT HEAD CODE STROKE
3 of 6 series · 14 of 47 positions shown, 17 images · non-contrast
Comparison: Head CT [DATE] and MRI [DATE]

CLINICAL DATA: Code stroke. Acute neuro deficit, stroke suspected.
History of metastatic melanoma.

EXAM:
CT HEAD WITHOUT CONTRAST
TECHNIQUE: Contiguous axial images were obtained from the base of the skull
through the vertex without intravenous contrast.

[Series 3: head 5.0 st · axial · 0.43mm/px · z∈[-152,-7]mm · 9 of 33 slices shown, 12 images]
[im 2/33  brain]
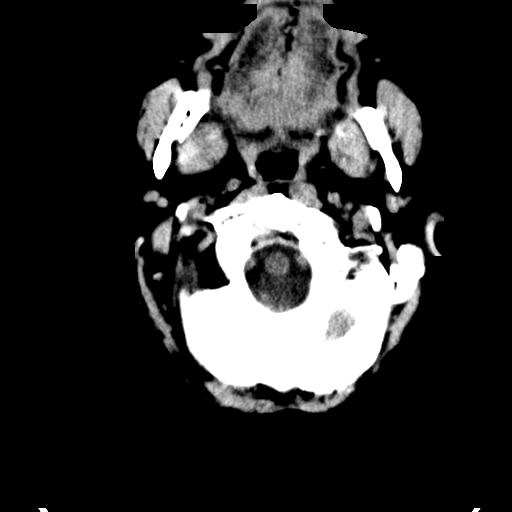
[im 2/33  bone]
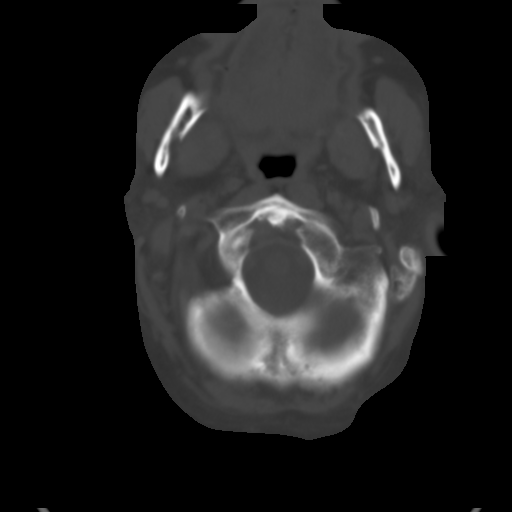
[im 6/33  brain]
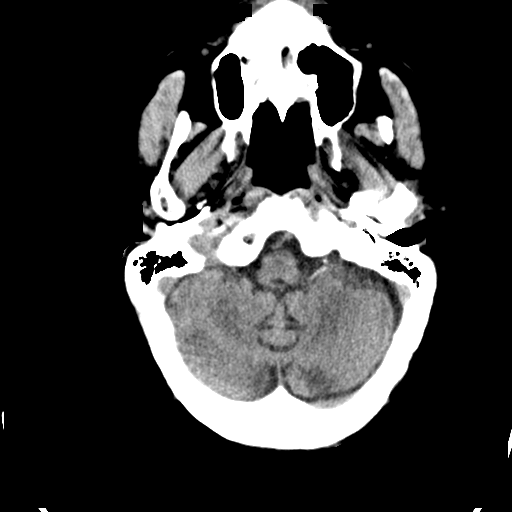
[im 9/33  brain]
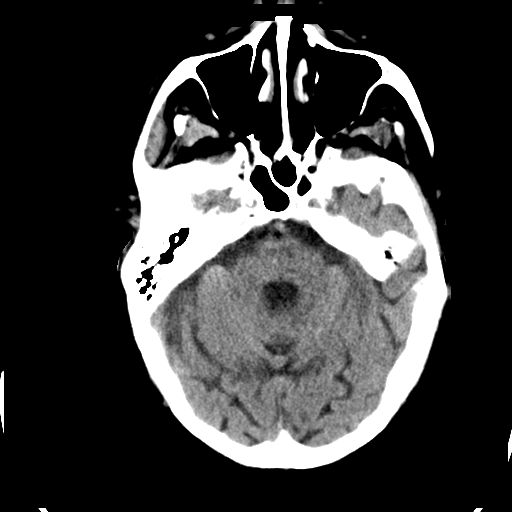
[im 13/33  brain]
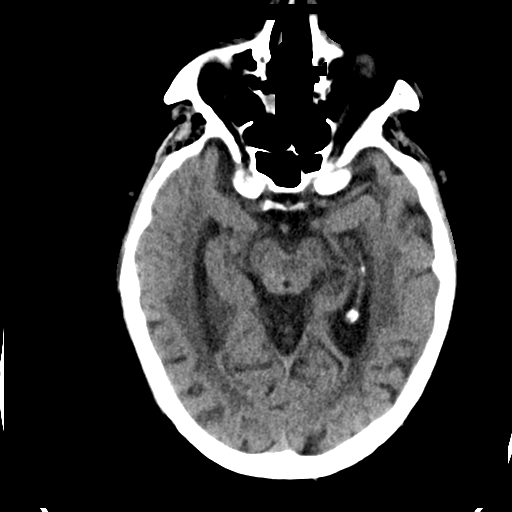
[im 17/33  brain]
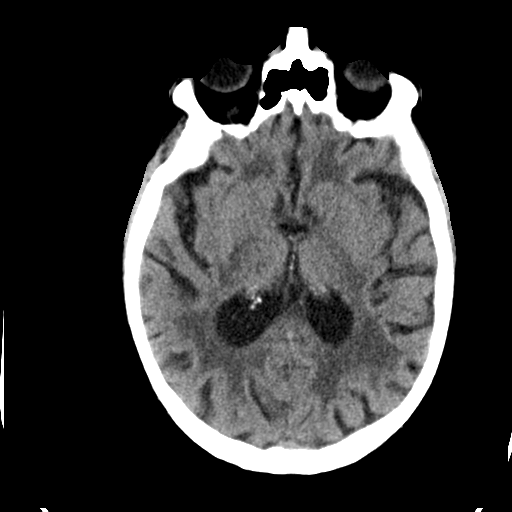
[im 17/33  bone]
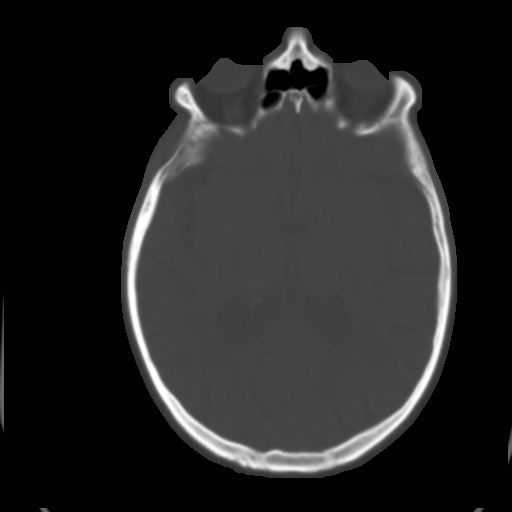
[im 20/33  brain]
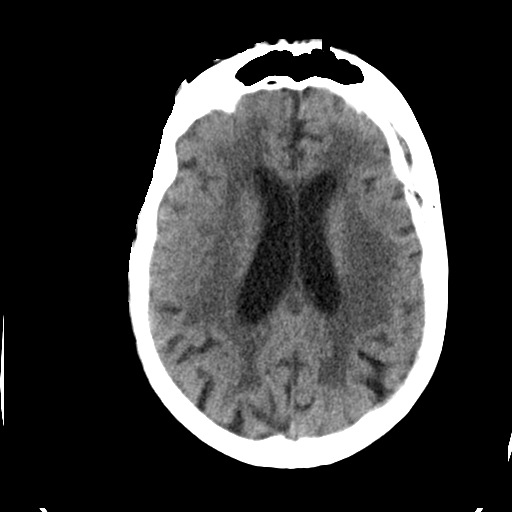
[im 24/33  brain]
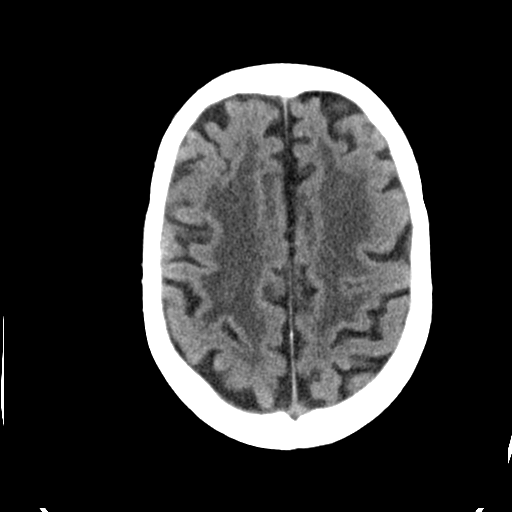
[im 27/33  brain]
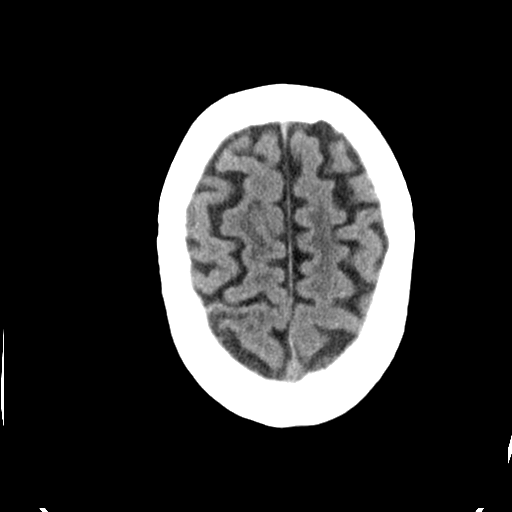
[im 31/33  brain]
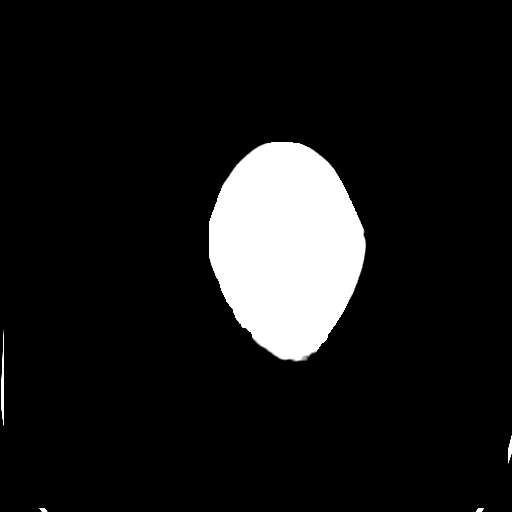
[im 31/33  bone]
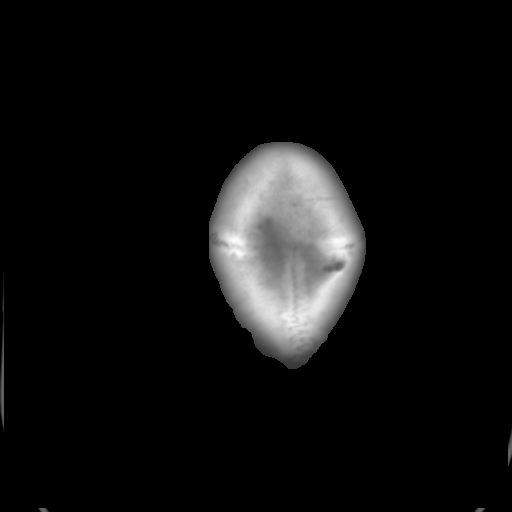

[Series 5: head 3.0 cor st · coronal · 0.32mm/px · 3 of 67 slices shown]
[im 17/67  brain]
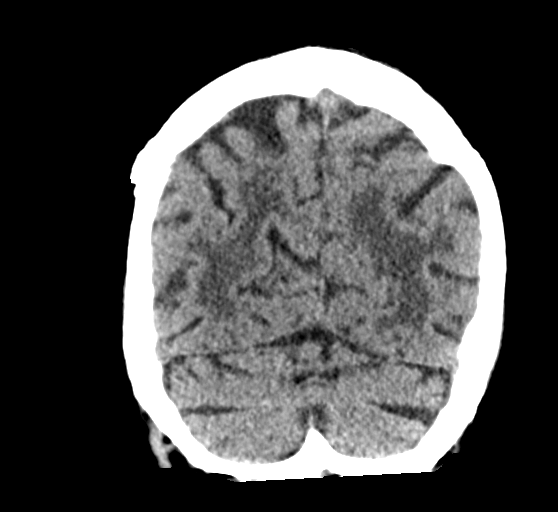
[im 34/67  brain]
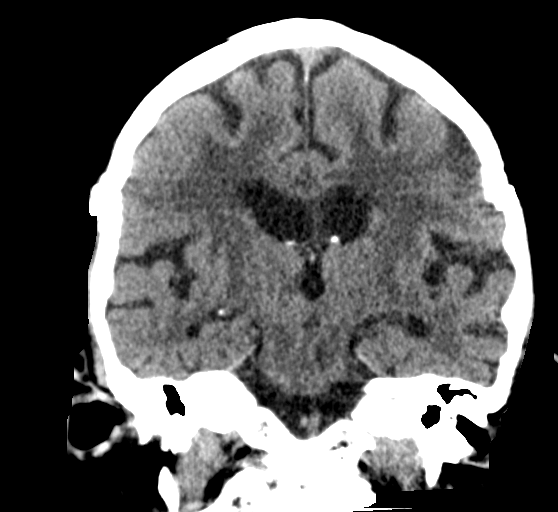
[im 50/67  brain]
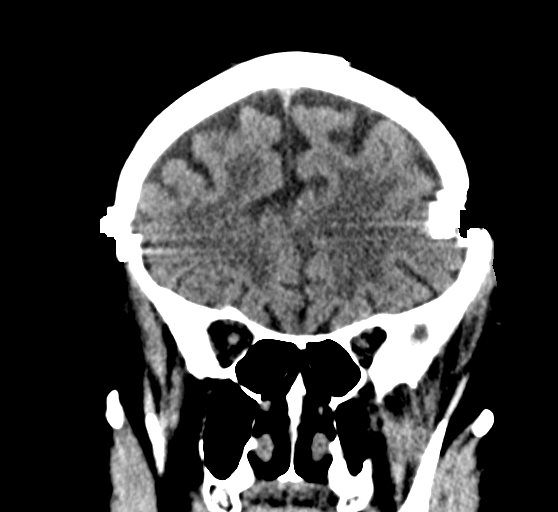

[Series 10: head 3.0 sag st · sagittal · 0.25mm/px · 2 of 66 slices shown]
[im 22/66  brain]
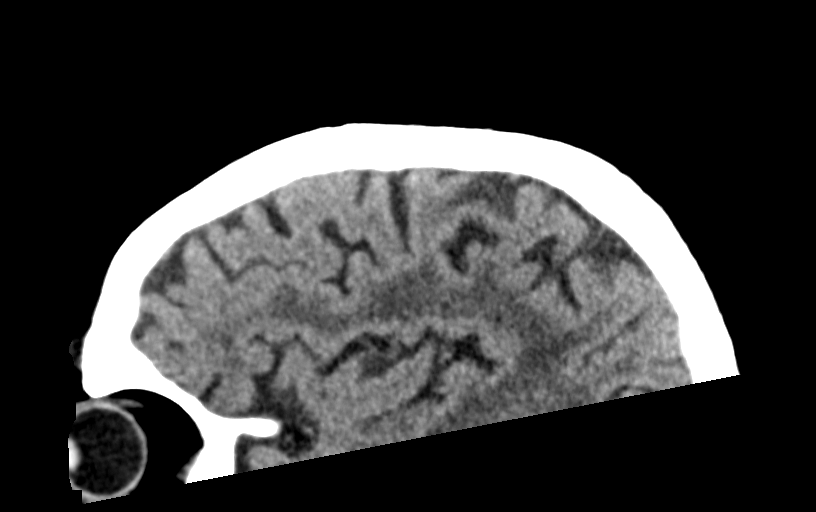
[im 44/66  brain]
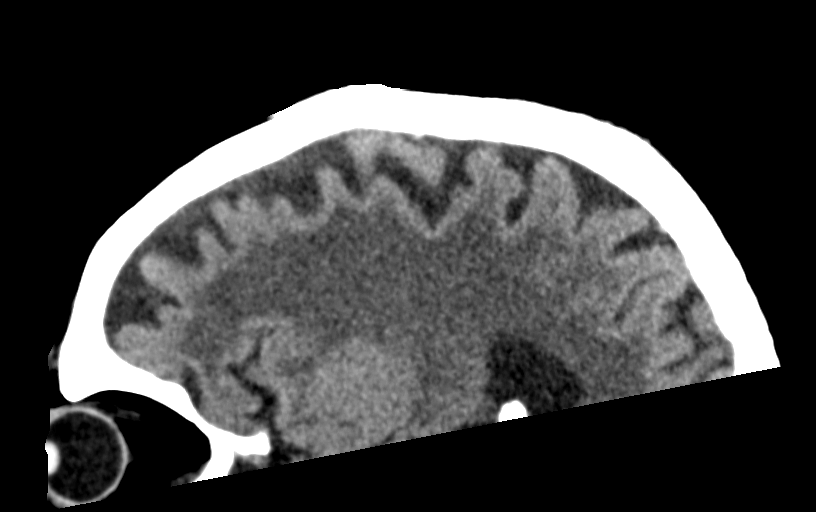

[14 of 47 positions shown; findings below may reference images not displayed]

FINDINGS: Brain: There is no evidence of an acute large territory infarct,
intracranial hemorrhage, extra-axial fluid collection or midline
shift/other significant mass effect. Brain metastases demonstrated
on the prior MRI are largely occult by CT. Focal cortical
hypodensity/encephalomalacia in the high posterior right frontal
lobe is unchanged and corresponds to the location of a previously
treated lesion. Confluent hypodensities throughout the cerebral
white matter bilaterally are unchanged and compatible with post
radiation changes superimposed on chronic small vessel ischemia.
There is moderate cerebral atrophy.

Vascular: No fracture or suspicious osseous crash that calcified
atherosclerosis at the skull base. No hyperdense vessel.

Skull: No fracture or suspicious osseous lesion.

Sinuses/Orbits: Mild mucosal thickening in the ethmoid sinuses.
Clear mastoid air cells. Unremarkable orbits.

Other: None.

ASPECTS (Alberta Stroke Program Early CT Score)

- Ganglionic level infarction (caudate, lentiform nuclei, internal
capsule, insula, M1-M3 cortex): 7

- Supraganglionic infarction (M4-M6 cortex): 3

Total score (0-10 with 10 being normal): 10
IMPRESSION: 1. No evidence of acute intracranial abnormality.
2. ASPECTS is 10.
3. Post radiation changes in the cerebral white matter.

These results were communicated to Dr. YU at [DATE] on
[DATE] by text page via the [employer] messaging system.

## 2020-12-01 IMAGING — MR MR HEAD WO/W CM
4 of 13 series · 9 of 48 positions shown · IV contrast (gadavist)
Comparison: CT studies earlier same day. MRI [DATE]. multiple
prior MRI studies as distant as [DATE].

CLINICAL DATA: Stroke versus seizure presentation patient being
treated for metastatic lung cancer.

EXAM:
MRI HEAD WITHOUT AND WITH CONTRAST
TECHNIQUE: Multiplanar, multiecho pulse sequences of the brain and surrounding
structures were obtained without and with intravenous contrast.
CONTRAST:  6mL GADAVIST GADOBUTROL 1 MMOL/ML IV SOLN

[Series 3: DWI · axial · 3.0mm · 0.94mm/px · z∈[-13,+37]mm · 2 of 98 slices shown]
[im 17/98]
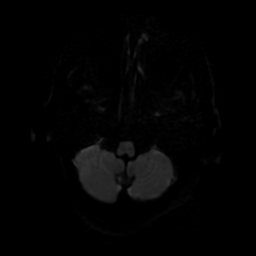
[im 49/98]
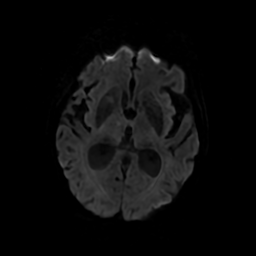

[Series 5: FLAIR · sagittal · 5.0mm · 0.23mm/px · 2 of 23 slices shown (1 of 3)]
[im 1/23]
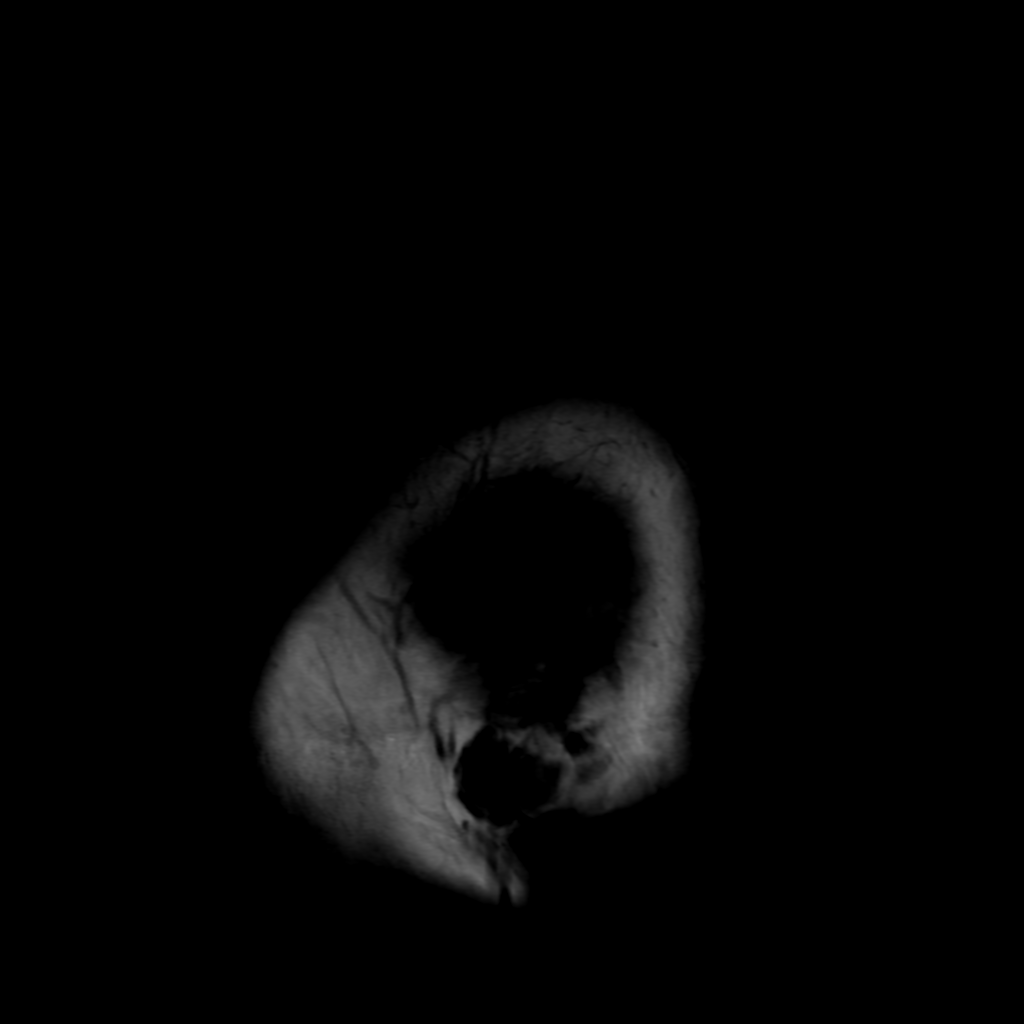
[im 23/23]
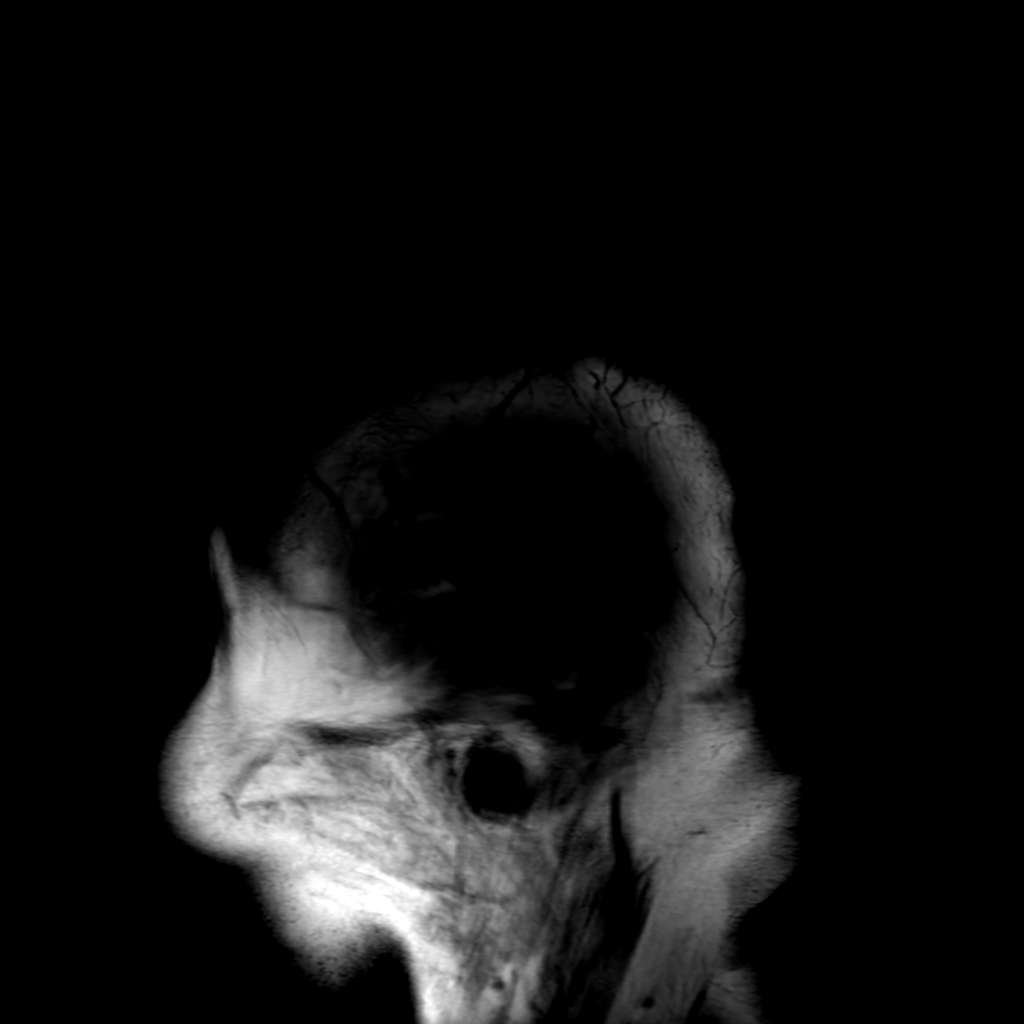

[Series 7: FLAIR · axial · 4.0mm · 0.45mm/px · z∈[-38,+107]mm · 3 of 35 slices shown (2 of 3)]
[im 1/35]
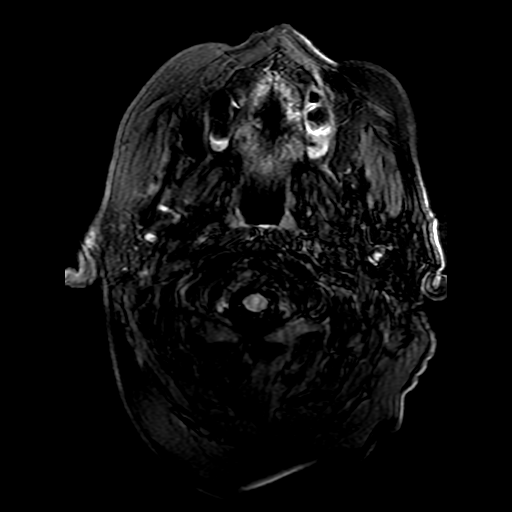
[im 18/35]
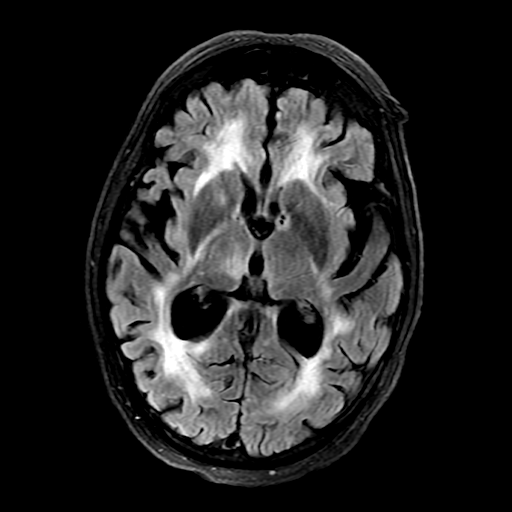
[im 35/35]
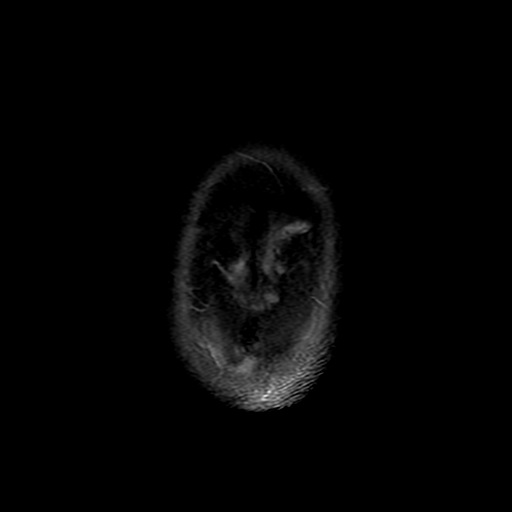

[Series 13: FLAIR · sagittal · 5.0mm · 0.23mm/px · 2 of 23 slices shown (3 of 3)]
[im 1/23]
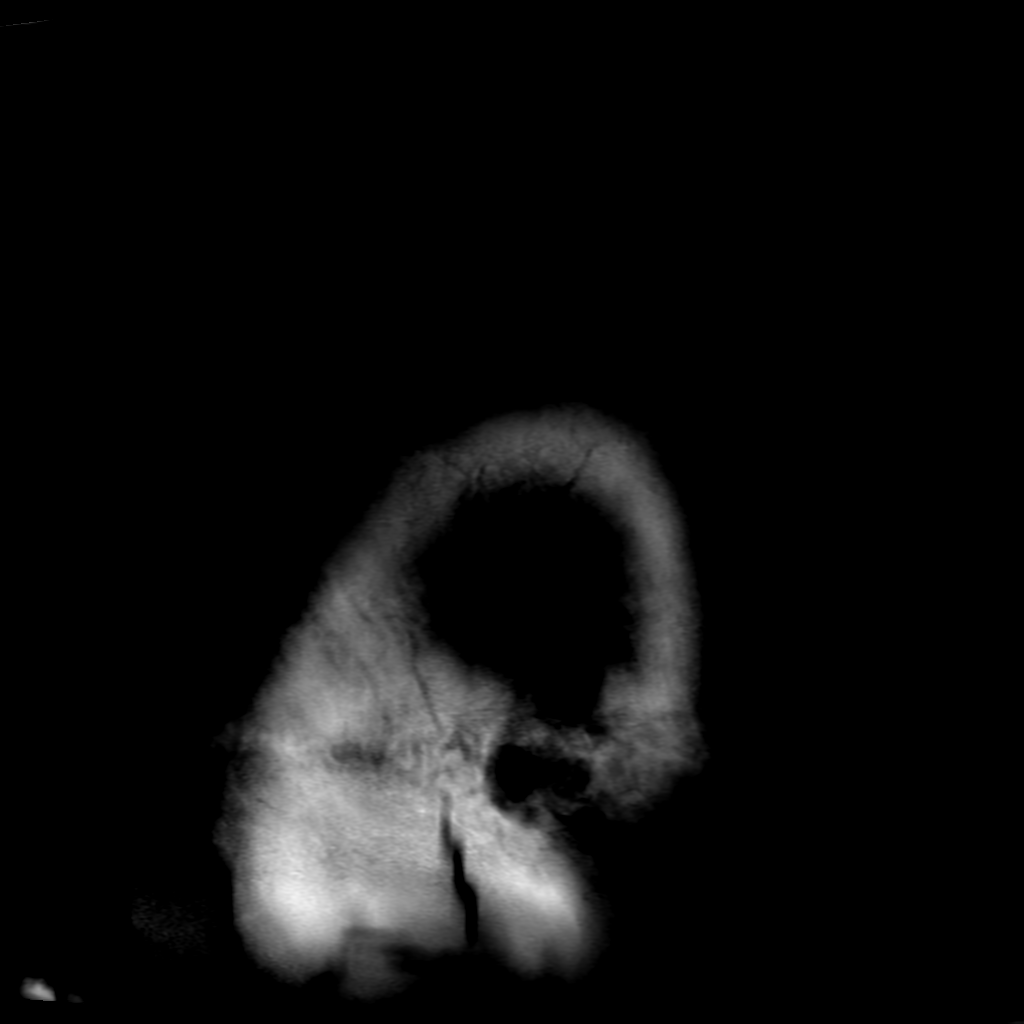
[im 23/23]
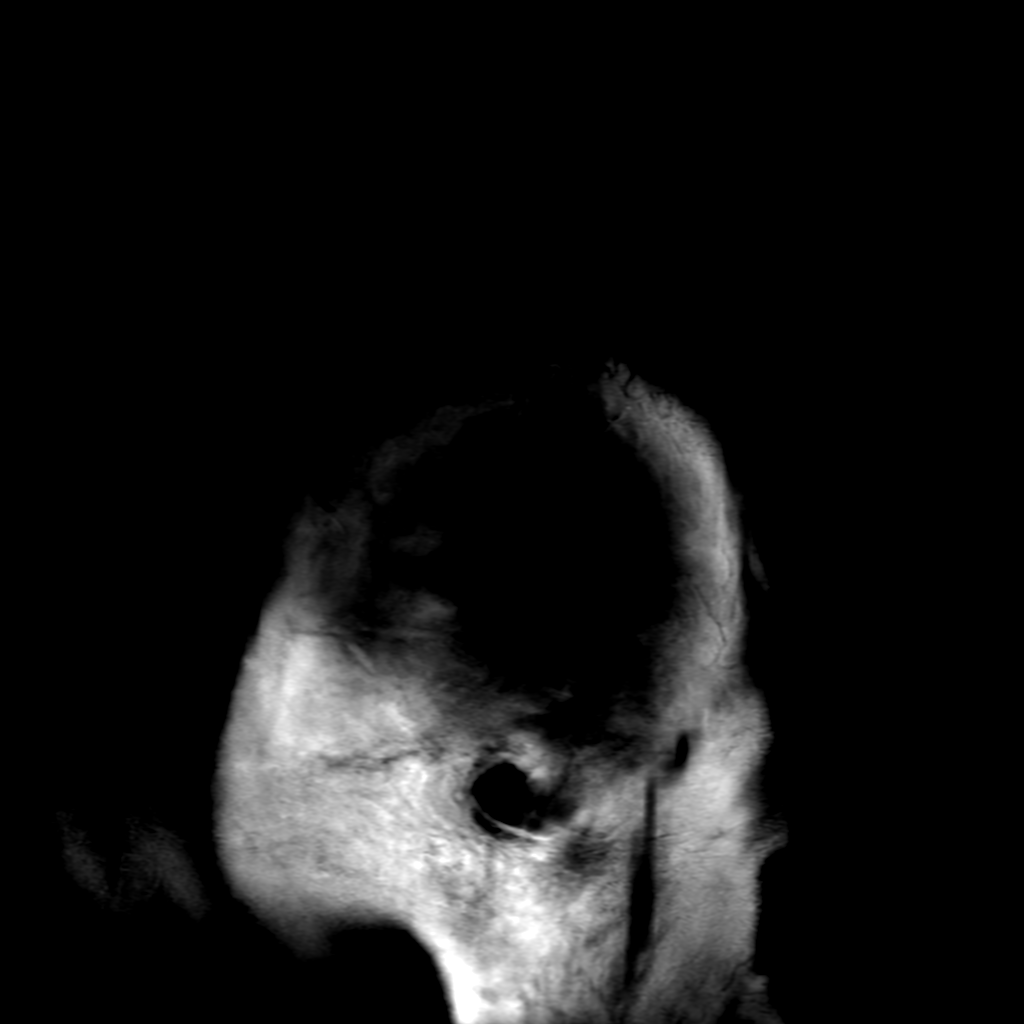

[9 of 48 positions shown; findings below may reference images not displayed]

FINDINGS: Brain: Today's study is considerably degraded by motion.

There is recurrent edema seen within the right mid brain and medial
thalamus, which had resolved on the study [DATE]. There is some
linear enhancement in the right mid brain. No enhancement in the
medial thalamus. These findings could represent some combination of
treatment effect and possibly recurrent disease in the mid brain.

There is enlargement of an enhancing focus within the left temporal
lobe just inferior and lateral to the temporal horn of the lateral
ventricle. This was present as a punctate focus of enhancement on
the study of 2 months ago but today measures 6 x 7 mm. This is most
likely a metastatic focus.

No other enhancing foci are evident. Multiple previously seen
metastatic foci no longer show mass effect or enhancement.
Strikingly, enhancement at the left posterior frontal vertex along
the cortical surfaces and within the subinsular white matter on the
right have completely resolved.

Widespread T2 and FLAIR signal throughout the white matter is
largely similar to the previous study otherwise.

No evidence of hemorrhage or hydrocephalus. No restricted diffusion
to suggest acute or subacute infarction.

Vascular: Major vessels at the base of the brain show flow.

Skull and upper cervical spine: Negative

Sinuses/Orbits: Clear/normal

Other: None
IMPRESSION: Diffusion imaging does not show any acute or subacute infarction.

Resolution of abnormal enhancement seen on the study of 2 months ago
within the subinsular white matter on the right and along the
cortical surface of the brain at the left posterior frontal vertex.

Enlargement of what was a punctate focus of enhancement in the left
temporal lobe, now measuring 6 x 7 mm today, favored to represent a
metastasis.

Recurrent linear enhancement in the mid brain on the right.
Recurrent edema in the right mid brain and medial thalamus. No
enhancement in the medial thalamus. These areas had normalized on
the study [DATE], were normal on the study of [DATE] but
showed striking enhancement on the study of [DATE]. Therefore, I
wonder if these areas represent waxing and waning treatment effect.

## 2020-12-01 IMAGING — DX DG CHEST 1V PORT
1 series · 1 of 1 positions shown · non-contrast
Comparison: [DATE].

CLINICAL DATA: Endotracheal tube placement.

EXAM:
PORTABLE CHEST 1 VIEW

[chest ap]
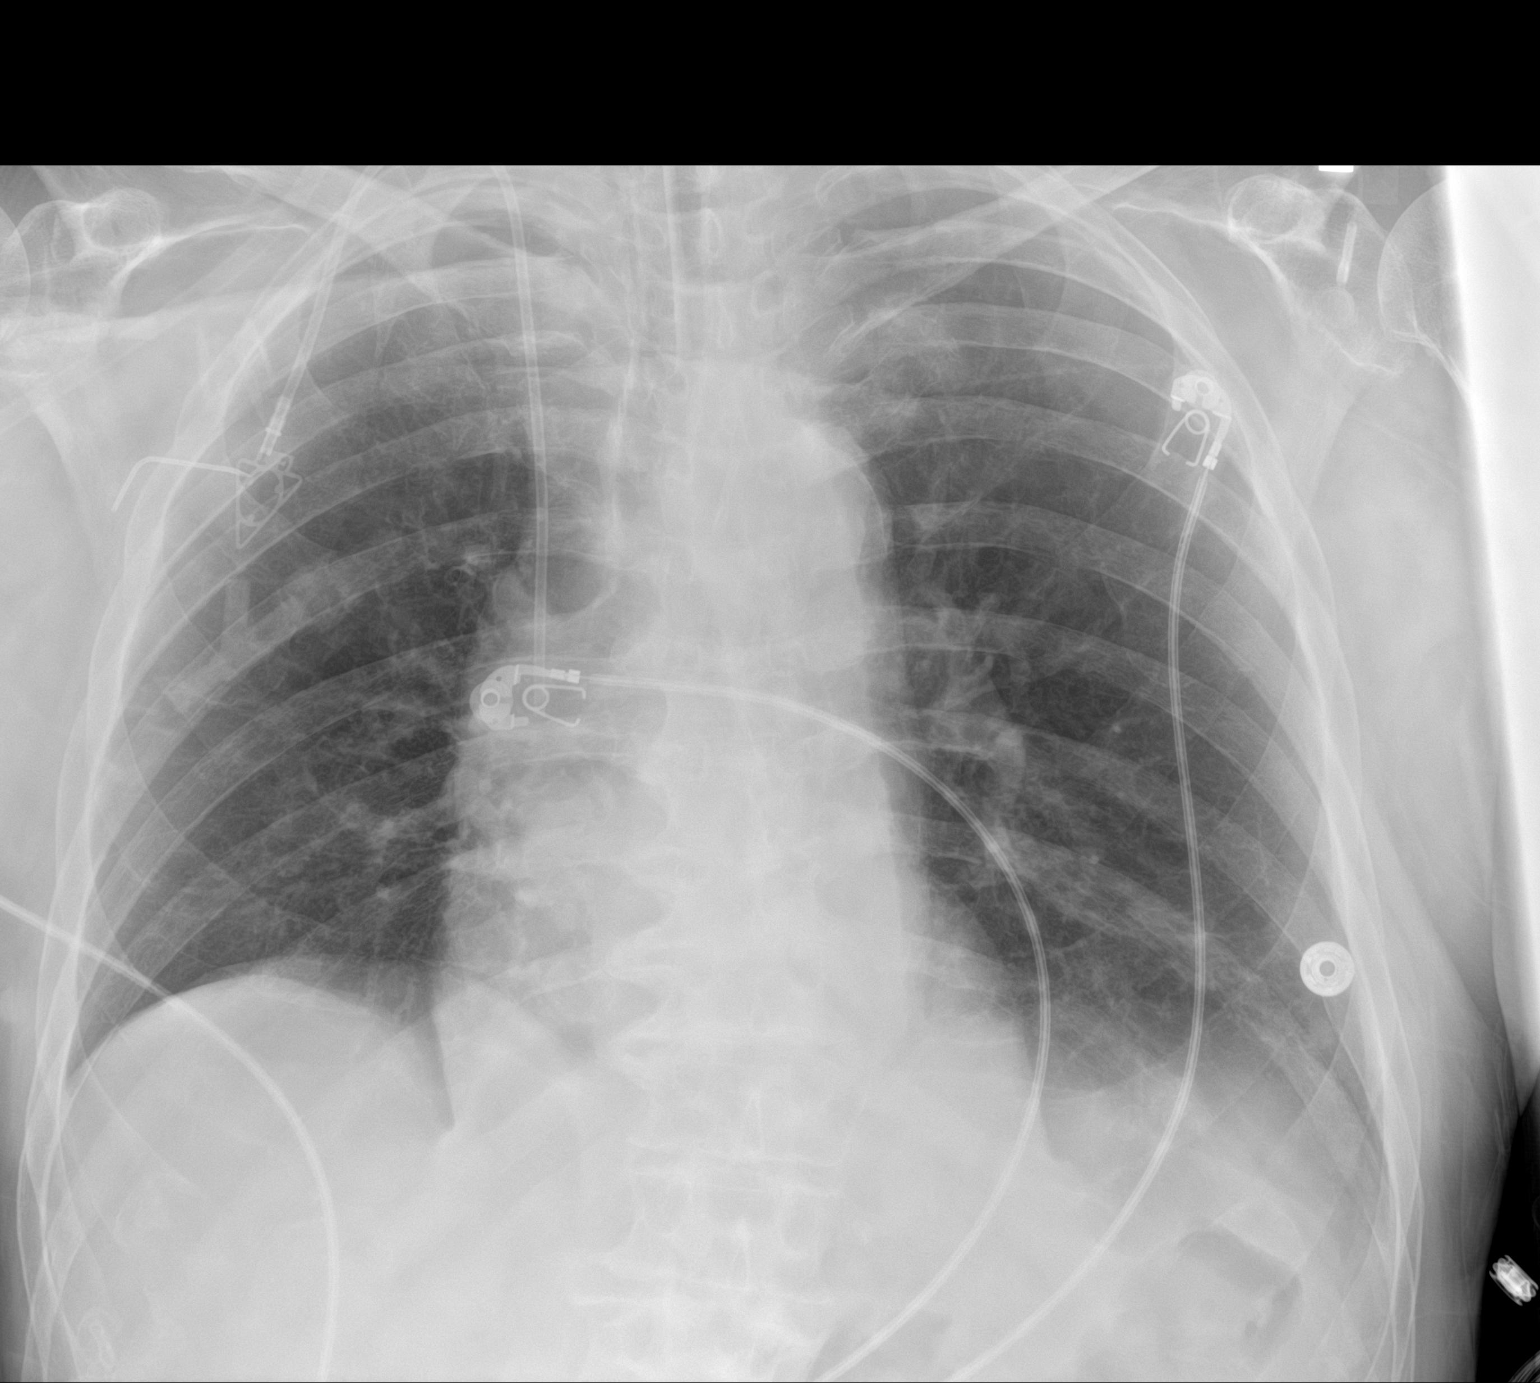

[1 of 1 positions shown; findings below may reference images not displayed]

FINDINGS: The heart size and mediastinal contours are within normal limits.
Both lungs are clear. Endotracheal tube is in grossly good position.
Right internal jugular Port-A-Cath is unchanged. The visualized
skeletal structures are unremarkable.
IMPRESSION: Endotracheal tube in grossly good position.

## 2020-12-01 IMAGING — CT CT ANGIO HEAD-NECK (W OR W/O PERF)
2 of 7 series · 8 of 33 positions shown · IV contrast (OMNI 350)
Comparison: Head CT earlier same day.

CLINICAL DATA: Neurological deficit.  Acute stroke suspected.

EXAM:
CT ANGIOGRAPHY HEAD AND NECK
TECHNIQUE: Multidetector CT imaging of the head and neck was performed using
the standard protocol during bolus administration of intravenous
contrast. Multiplanar CT image reconstructions and MIPs were
obtained to evaluate the vascular anatomy. Carotid stenosis
measurements (when applicable) are obtained utilizing NASCET
criteria, using the distal internal carotid diameter as the
denominator.
CONTRAST:  75mL OMNIPAQUE IOHEXOL 350 MG/ML SOLN

[Series 6: cta neck · axial · 0.53mm/px · z∈[-262,-132]mm · 2 of 196 slices shown]
[im 66/196  soft-tissue]
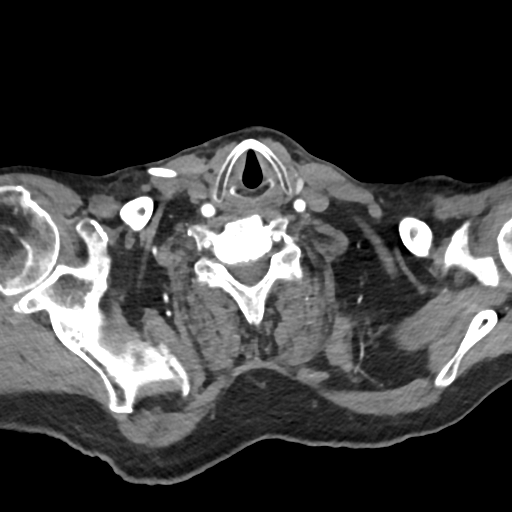
[im 131/196  soft-tissue]
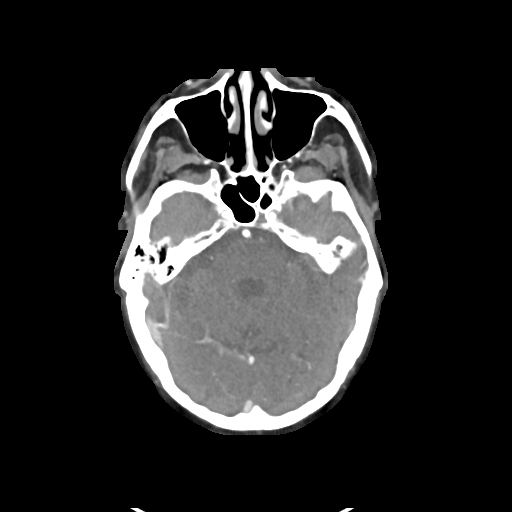

[Series 8: cta neck axial · axial · 0.42mm/px · z∈[-357,-79]mm · 6 of 392 slices shown]
[im 56/392  soft-tissue]
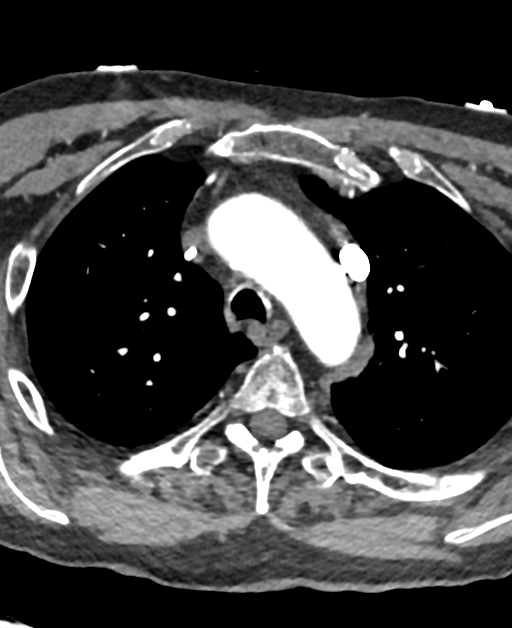
[im 112/392  bone]
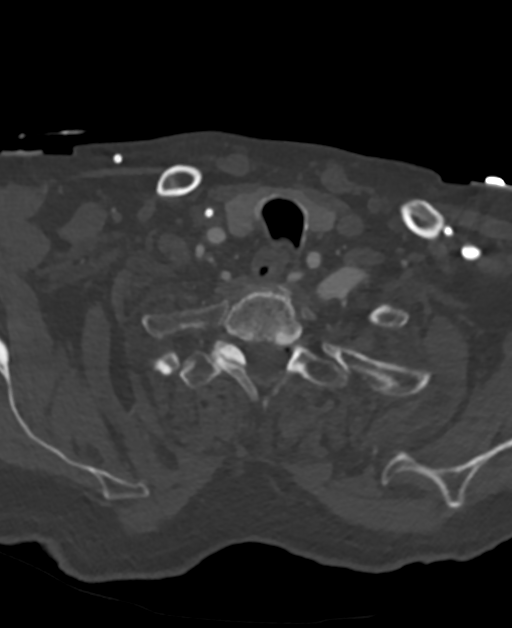
[im 168/392  soft-tissue]
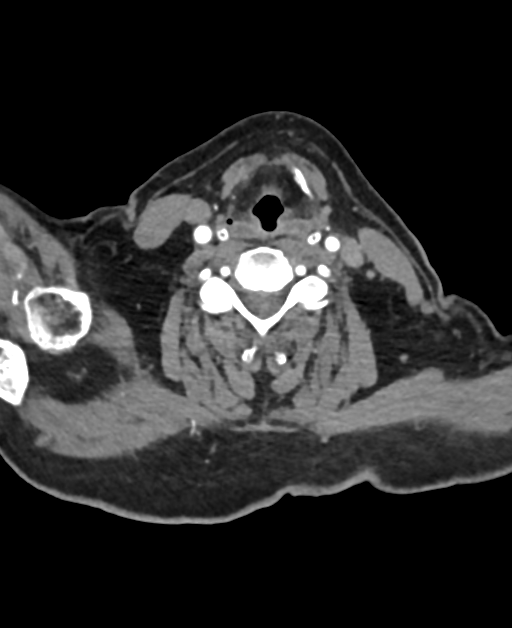
[im 224/392  bone]
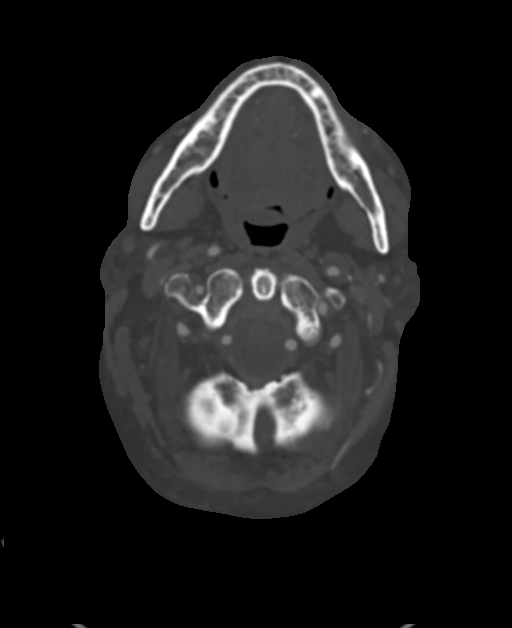
[im 280/392  soft-tissue]
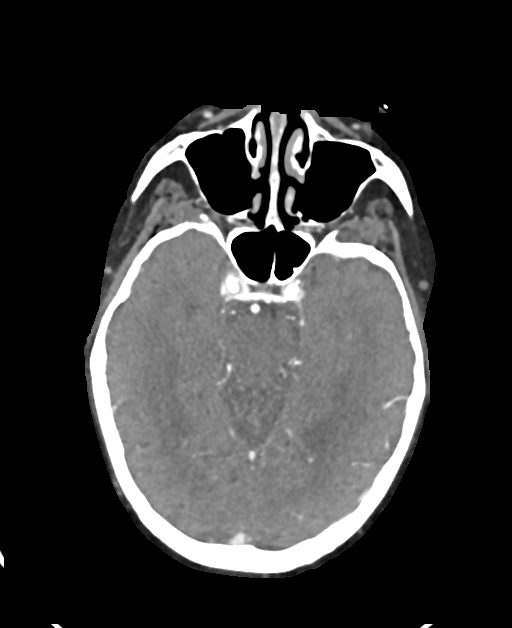
[im 336/392  bone]
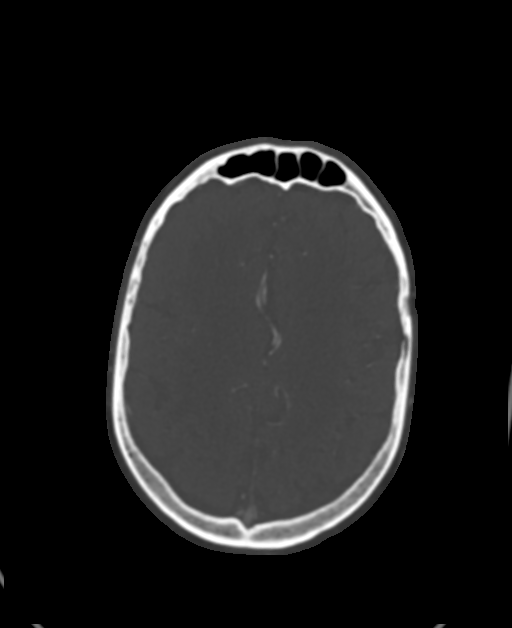

[8 of 33 positions shown; findings below may reference images not displayed]

FINDINGS: CTA NECK FINDINGS

Aortic arch: Aortic atherosclerosis. Branching pattern is normal
without origin stenosis.

Right carotid system: Common carotid artery widely patent to the
bifurcation. Calcified plaque at the carotid bifurcation and ICA
bulb but no stenosis compared to the more distal cervical ICA
diameter.

Left carotid system: Common carotid artery widely patent to the
bifurcation. Calcified plaque at the carotid bifurcation and ICA
bulb. No stenosis compared to the diameter of the more distal
cervical ICA.

Vertebral arteries: Left vertebral artery origin is normal. Right
vertebral artery origin shows some adjacent calcification but no
stenosis. Both vertebral arteries widely patent through the cervical
region to the foramen magnum.

Skeleton: Ordinary cervical spondylosis.

Other neck: No mass or lymphadenopathy.

Upper chest: Mild emphysema and pulmonary scarring.

Review of the MIP images confirms the above findings

CTA HEAD FINDINGS

Anterior circulation: Both internal carotid arteries patent through
the skull base and siphon regions. Minimal atherosclerotic
calcification in the carotid siphon regions but no stenosis. The
anterior and middle cerebral vessels are patent. No acute large
vessel occlusion or correctable proximal stenosis.

Posterior circulation: Both vertebral arteries widely patent through
the foramen magnum to the basilar. No basilar stenosis. Posterior
circulation branch vessels appear normal.

Venous sinuses: Patent and normal.

Anatomic variants: None significant.

Review of the MIP images confirms the above findings
IMPRESSION: No acute intracranial large or medium vessel occlusion. No
correctable proximal stenosis.

Atherosclerotic calcification at both carotid bifurcations but
without stenosis.

These results were communicated to Dr. SANUKI at [DATE] SANUKI
[DATE]by text page via the [employer] messaging system.

## 2020-12-01 MED ORDER — SODIUM CHLORIDE 0.9% FLUSH
3.0000 mL | Freq: Once | INTRAVENOUS | Status: AC
  Administered 2020-12-01: 3 mL via INTRAVENOUS

## 2020-12-01 MED ORDER — ORAL CARE MOUTH RINSE: 15.0000 mL | Freq: Two times a day (BID) | OROMUCOSAL | Status: DC

## 2020-12-01 MED ORDER — LEVETIRACETAM IN NACL 1000 MG/100ML IV SOLN
1000.0000 mg | Freq: Once | INTRAVENOUS | Status: AC
  Administered 2020-12-01: 1000 mg via INTRAVENOUS
  Filled 2020-12-01: qty 100

## 2020-12-01 MED ORDER — LORAZEPAM 2 MG/ML IJ SOLN
INTRAMUSCULAR | Status: AC
  Administered 2020-12-01: 1 mg
  Filled 2020-12-01: qty 1

## 2020-12-01 MED ORDER — IOHEXOL 350 MG/ML SOLN
75.0000 mL | Freq: Once | INTRAVENOUS | Status: AC | PRN
  Administered 2020-12-01: 75 mL via INTRAVENOUS

## 2020-12-01 MED ORDER — POLYETHYLENE GLYCOL 3350 17 G PO PACK: 17.0000 g | PACK | Freq: Every day | ORAL | Status: DC | PRN

## 2020-12-01 MED ORDER — ROCURONIUM BROMIDE 10 MG/ML (PF) SYRINGE: 60.0000 mg | PREFILLED_SYRINGE | Freq: Once | INTRAVENOUS | Status: AC

## 2020-12-01 MED ORDER — LACTATED RINGERS IV SOLN: INTRAVENOUS | Status: DC

## 2020-12-01 MED ORDER — PHENYTOIN SODIUM 50 MG/ML IJ SOLN
100.0000 mg | Freq: Three times a day (TID) | INTRAMUSCULAR | Status: DC
  Administered 2020-12-01 – 2020-12-07 (×18): 100 mg via INTRAVENOUS
  Filled 2020-12-01 (×18): qty 2

## 2020-12-01 MED ORDER — FENTANYL CITRATE (PF) 100 MCG/2ML IJ SOLN
INTRAMUSCULAR | Status: AC
  Filled 2020-12-01: qty 2

## 2020-12-01 MED ORDER — SODIUM CHLORIDE 0.9 % IV SOLN
20.0000 mg/kg | Freq: Once | INTRAVENOUS | Status: AC
  Administered 2020-12-01: 1340 mg via INTRAVENOUS
  Filled 2020-12-01 (×2): qty 26.8

## 2020-12-01 MED ORDER — SODIUM CHLORIDE 0.9% FLUSH
10.0000 mL | Freq: Two times a day (BID) | INTRAVENOUS | Status: DC
  Administered 2020-12-01 – 2020-12-02 (×3): 10 mL
  Administered 2020-12-03: 20 mL
  Administered 2020-12-03 – 2020-12-04 (×3): 10 mL
  Administered 2020-12-05: 20 mL
  Administered 2020-12-05: 10 mL
  Administered 2020-12-06: 20 mL
  Administered 2020-12-07 – 2020-12-17 (×13): 10 mL

## 2020-12-01 MED ORDER — ETOMIDATE 2 MG/ML IV SOLN: 20.0000 mg | Freq: Once | INTRAVENOUS | Status: AC

## 2020-12-01 MED ORDER — CHLORHEXIDINE GLUCONATE 0.12 % MT SOLN
15.0000 mL | Freq: Two times a day (BID) | OROMUCOSAL | Status: DC
  Filled 2020-12-01: qty 15

## 2020-12-01 MED ORDER — SODIUM CHLORIDE 0.9 % IV SOLN: 2000.0000 mg | INTRAVENOUS | Status: DC

## 2020-12-01 MED ORDER — LORAZEPAM 2 MG/ML IJ SOLN
INTRAMUSCULAR | Status: AC
  Administered 2020-12-01: 1 mg via INTRAVENOUS
  Filled 2020-12-01: qty 1

## 2020-12-01 MED ORDER — CHLORHEXIDINE GLUCONATE 0.12% ORAL RINSE (MEDLINE KIT)
15.0000 mL | Freq: Two times a day (BID) | OROMUCOSAL | Status: DC
  Administered 2020-12-01 – 2020-12-02 (×2): 15 mL via OROMUCOSAL

## 2020-12-01 MED ORDER — MIDAZOLAM HCL 2 MG/2ML IJ SOLN
INTRAMUSCULAR | Status: AC
  Filled 2020-12-01: qty 2

## 2020-12-01 MED ORDER — DEXMEDETOMIDINE HCL IN NACL 400 MCG/100ML IV SOLN
0.0000 ug/kg/h | INTRAVENOUS | Status: DC
  Administered 2020-12-01: 0.4 ug/kg/h via INTRAVENOUS
  Filled 2020-12-01 (×3): qty 100

## 2020-12-01 MED ORDER — CHLORHEXIDINE GLUCONATE CLOTH 2 % EX PADS
6.0000 | MEDICATED_PAD | Freq: Every day | CUTANEOUS | Status: DC
  Administered 2020-12-01 – 2020-12-17 (×16): 6 via TOPICAL

## 2020-12-01 MED ORDER — PHENYLEPHRINE 40 MCG/ML (10ML) SYRINGE FOR IV PUSH (FOR BLOOD PRESSURE SUPPORT)
PREFILLED_SYRINGE | INTRAVENOUS | Status: AC
  Filled 2020-12-01: qty 10

## 2020-12-01 MED ORDER — ROCURONIUM BROMIDE 10 MG/ML (PF) SYRINGE
PREFILLED_SYRINGE | INTRAVENOUS | Status: AC
  Administered 2020-12-01: 60 mg via INTRAVENOUS
  Filled 2020-12-01: qty 10

## 2020-12-01 MED ORDER — DEXAMETHASONE SODIUM PHOSPHATE 10 MG/ML IJ SOLN
10.0000 mg | Freq: Once | INTRAMUSCULAR | Status: AC
  Administered 2020-12-01: 10 mg via INTRAVENOUS
  Filled 2020-12-01: qty 1

## 2020-12-01 MED ORDER — LEVETIRACETAM IN NACL 1000 MG/100ML IV SOLN
1000.0000 mg | Freq: Two times a day (BID) | INTRAVENOUS | Status: DC
  Administered 2020-12-01 – 2020-12-02 (×3): 1000 mg via INTRAVENOUS
  Filled 2020-12-01 (×3): qty 100

## 2020-12-01 MED ORDER — SODIUM CHLORIDE 0.9% FLUSH
10.0000 mL | INTRAVENOUS | Status: DC | PRN
  Administered 2020-12-08: 10 mL

## 2020-12-01 MED ORDER — DOCUSATE SODIUM 100 MG PO CAPS: 100.0000 mg | ORAL_CAPSULE | Freq: Two times a day (BID) | ORAL | Status: DC | PRN

## 2020-12-01 MED ORDER — FENTANYL CITRATE (PF) 100 MCG/2ML IJ SOLN: 25.0000 ug | INTRAMUSCULAR | Status: DC | PRN

## 2020-12-01 MED ORDER — LORAZEPAM 2 MG/ML IJ SOLN: 1.0000 mg | Freq: Once | INTRAMUSCULAR | Status: DC

## 2020-12-01 MED ORDER — ETOMIDATE 2 MG/ML IV SOLN
INTRAVENOUS | Status: AC
  Administered 2020-12-01: 20 mg via INTRAVENOUS
  Filled 2020-12-01: qty 20

## 2020-12-01 MED ORDER — ORAL CARE MOUTH RINSE
15.0000 mL | OROMUCOSAL | Status: DC
  Administered 2020-12-01 – 2020-12-02 (×9): 15 mL via OROMUCOSAL

## 2020-12-01 MED ORDER — LORAZEPAM 2 MG/ML IJ SOLN: 1.0000 mg | Freq: Once | INTRAMUSCULAR | Status: AC

## 2020-12-01 MED ORDER — GADOBUTROL 1 MMOL/ML IV SOLN
6.0000 mL | Freq: Once | INTRAVENOUS | Status: AC | PRN
  Administered 2020-12-01: 6 mL via INTRAVENOUS

## 2020-12-01 MED ORDER — SODIUM CHLORIDE 0.9 % IV SOLN
200.0000 mg | Freq: Once | INTRAVENOUS | Status: AC
  Administered 2020-12-01: 200 mg via INTRAVENOUS
  Filled 2020-12-01: qty 20

## 2020-12-01 MED ORDER — PANTOPRAZOLE SODIUM 40 MG IV SOLR
40.0000 mg | Freq: Every day | INTRAVENOUS | Status: DC
  Administered 2020-12-01 – 2020-12-07 (×7): 40 mg via INTRAVENOUS
  Filled 2020-12-01 (×7): qty 40

## 2020-12-01 NOTE — ED Notes (Signed)
Attempted to give reportx1 

## 2020-12-01 NOTE — Assessment & Plan Note (Signed)
Plan Trend cbc

## 2020-12-01 NOTE — Assessment & Plan Note (Signed)
No longer on treatment Plan F/u w/ onc Will have in-pt palliative see

## 2020-12-01 NOTE — Progress Notes (Signed)
1715: Upon arrival to unit patient noted to have labored breathing with accessory muscle use and increased episodes of apnea + desaturation into high 70%-80%. Paged CCM pager. NP Hillery Aldo arrived to bedside. Began bagging patient shortly after NP Rusk arrival. Pt intubated. Vital signs stable. Family at bedside.

## 2020-12-01 NOTE — Assessment & Plan Note (Signed)
Was on home lasix but no longer taking Plan Hold off for now  PRN if needed

## 2020-12-01 NOTE — Assessment & Plan Note (Signed)
Plan Resume flomax when able Will need to f/u bladder scan and watch for urinary retention while off flomax

## 2020-12-01 NOTE — Assessment & Plan Note (Signed)
Plan Trend chems Avoid hypotension Renal dose meds I&O

## 2020-12-01 NOTE — ED Notes (Signed)
Pt had BM, cleaned pt and applied clean brief.

## 2020-12-01 NOTE — Assessment & Plan Note (Signed)
Got 1 dose decadron in ER Plan No longer on treatment Plan F/u w/ onc Will have in-pt palliative see

## 2020-12-01 NOTE — ED Notes (Signed)
Patient transported to MRI 

## 2020-12-01 NOTE — Assessment & Plan Note (Signed)
Now intubated  Plan Cont full vent support Cont EEG PAD -1 for now but if burst suppression needed this will drive sedation VAP bundle  F/u abg  Am cxr

## 2020-12-01 NOTE — Consult Note (Signed)
Neurology Consultation  Reason for Consult: Code Stroke for acute onset aphasia, rt-sided weakness Referring Physician: Dr. Regenia Skeeter  CC: Acute aphasia, right-sided weakness  History is obtained from: Chart review, EMS, Patient's wife via telephone. Unable to obtain from patient due to patient's condition.   HPI: Walter Hernandez is a 70 y.o. male with a medical history significant for hypertension, malignant melanoma of the left lower leg, extensive stage metastatic small cell lung cancer with mets to the liver and brain on chemotherapy who presented to the ED 12/01/2020 as a Code Stroke for evaluation of acute aphasia and right-sided weakness. His wife states that he tried to nap at around 10:00 this morning in his normal state of health and when he woke a short time later, he appeared confused, looked as though he was lost, and was no longer communicating with his wife. She states that at this time he was not following her commands so she activated EMS for further evaluation.   On chart review, [name] has been evaluated for episodes of sudden loss of awareness and staring off with subsequent confusion persisting up to an hour following these events in March of 2022. Due to his cortical nature of brain metastases and events that were consistent with suspected epileptic events, he was placed on Keppra 500 mg BID for further seizure prophylaxis. His wife states that he has had a seizure in the past with similar presentation and expressed concern for patient seizure this morning at home.   At baseline, [name] is able to communicate with fluent language and comprehension. He has had some provider noted age advanced psychomotor slowing and delayed recall but is oriented to himself and his environment. He lives with his wife and ambulates at home without gait disturbance.   LKW: 10:00 AM tpa given?: no, history of brain metastases with high risk of hemorrhage.,  Thrombocytopenia IR Thrombectomy? No,  presentation is not consistent with large vessel occlusion.  Modified Rankin Scale: 2-Slight disability-UNABLE to perform all activities but does not need assistance  ROS: Unable to obtain due to altered mental status.   Past Medical History:  Diagnosis Date   Cancer [facility])    recently discovered melanoma   GERD (gastroesophageal reflux disease)    Goals of care, counseling/discussion 06/19/2019   History of hiatal hernia    Hypertension    recently placed on new blood pressure med   Malignant melanoma of skin of left lower leg ([place]) 07/28/2015   Past Surgical History:  Procedure Laterality Date   BRONCHIAL NEEDLE ASPIRATION BIOPSY  06/05/2019   Procedure: Bronchial Needle Aspiration Biopsies;  Surgeon: Candee Furbish, MD;  Location: [facility];  Service: Thoracic;;   CARDIAC CATHETERIZATION     15 yrs ago...came out normal   -- hiatal hernia   COLONOSCOPY  2012   Dr Earlean Shawl    ENDOBRONCHIAL ULTRASOUND N/A 06/11/2019   Procedure: ENDOBRONCHIAL ULTRASOUND;  Surgeon: Garner Nash, DO;  Location: [facility];  Service: Endoscopy;  Laterality: N/A;   ESOPHAGOGASTRODUODENOSCOPY  2012   Dr Earlean Shawl. has a really bad hiatal hernia    FINE NEEDLE ASPIRATION  06/11/2019   Procedure: FINE NEEDLE ASPIRATION (FNA) LINEAR;  Surgeon: Garner Nash, DO;  Location: [facility];  Service: Endoscopy;;   IR IMAGING GUIDED PORT INSERTION  07/03/2019   MELANOMA EXCISION WITH SENTINEL LYMPH NODE BIOPSY Left 09/29/2014   Procedure: WIDE EXCISION MELANOMA LEFT CALF WITH LEFT INGUINAL  SENTINEL LYMPH NODE BIOPSY;  Surgeon: Georganna Skeans, MD;  Location: [facility] OR;  Service: General;  Laterality: Left;   skin lesion excised     non cancerous   VIDEO BRONCHOSCOPY N/A 06/11/2019   Procedure: VIDEO BRONCHOSCOPY WITHOUT FLUORO;  Surgeon: Garner Nash, DO;  Location: [facility];  Service: Endoscopy;  Laterality: N/A;   VIDEO BRONCHOSCOPY WITH ENDOBRONCHIAL ULTRASOUND N/A 06/05/2019   Procedure: VIDEO BRONCHOSCOPY  WITH ENDOBRONCHIAL ULTRASOUND;  Surgeon: Candee Furbish, MD;  Location: [facility] OR;  Service: Thoracic;  Laterality: N/A;   Family History  Problem Relation Age of Onset   Colon cancer Neg Hx    Esophageal cancer Neg Hx    Social History:   reports that he has quit smoking. His smoking use included cigarettes. He has a 50.00 pack-year smoking history. He has never used smokeless tobacco. He reports previous alcohol use. He reports that he does not use drugs.  Medications  Current Facility-Administered Medications:    dexamethasone (DECADRON) injection 10 mg, 10 mg, Intravenous, Once, Amie Portland, MD   levETIRAcetam (KEPPRA) IVPB 1000 mg/100 mL premix, 1,000 mg, Intravenous, Once **AND** levETIRAcetam (KEPPRA) IVPB 1000 mg/100 mL premix, 1,000 mg, Intravenous, Once, Amie Portland, MD   sodium chloride flush (NS) 0.9 % injection 3 mL, 3 mL, Intravenous, Once, Sherwood Gambler, MD  Current Outpatient Medications:    acetaminophen (TYLENOL) 500 MG tablet, Take 1,000 mg by mouth every 6 (six) hours as needed for moderate pain., Disp: , Rfl:    ALPRAZolam (XANAX) 0.5 MG tablet, Take 1 tablet (0.5 mg total) by mouth every 6 (six) hours as needed for anxiety., Disp: 60 tablet, Rfl: 0   dexamethasone (DECADRON) 4 MG tablet, Dexamethasone 4 mg TID x 2 days, BID x 2 days, then 2 mg (1/2 tab) BID x 7 days, then 2 mg (1/2 tab) QD x 7 days then stop. (Patient not taking: No sig reported), Disp: 30 tablet, Rfl: 0   dexamethasone (DECADRON) 4 MG tablet, Take 2 tablets (8 mg total) by mouth 2 (two) times daily. You must take with food in the morning and at dinner., Disp: 100 tablet, Rfl: 4   diphenoxylate-atropine (LOMOTIL) 2.5-0.025 MG tablet, TAKE 2 TABLETS AT ONSET OF DIARRHEA. TAKE 1 TABLET AFTER EACH LOOSE STOOL. MAX 8 TABLETS PER DAY., Disp: 60 tablet, Rfl: 0   dronabinol (MARINOL) 5 MG capsule, Take 1 capsule (5 mg total) by mouth 2 (two) times daily before a meal., Disp: 60 capsule, Rfl: 2   finasteride  (PROSCAR) 5 MG tablet, TAKE 1 TABLET BY MOUTH EVERY DAY, Disp: 90 tablet, Rfl: 1   furosemide (LASIX) 20 MG tablet, Take 2 tablets (40 mg total) by mouth daily., Disp: 30 tablet, Rfl: 5   levETIRAcetam (KEPPRA) 500 MG tablet, Take 1 tablet (500 mg total) by mouth 2 (two) times daily., Disp: 60 tablet, Rfl: 0   levETIRAcetam (KEPPRA) 500 MG tablet, Take 1 tablet (500 mg total) by mouth 2 (two) times daily., Disp: 60 tablet, Rfl: 6   loperamide (IMODIUM) 2 MG capsule, , Disp: , Rfl:    metroNIDAZOLE (FLAGYL) 500 MG tablet, Take 1 tablet (500 mg total) by mouth 3 (three) times daily., Disp: 21 tablet, Rfl: 0   Multiple Vitamin (MULTIVITAMIN) capsule, Take 1 capsule by mouth daily., Disp: , Rfl:    Naphazoline-Pheniramine (OPCON-A OP), Place 2 drops into both eyes as needed (for dry eyes)., Disp: , Rfl:    omeprazole (PRILOSEC) 40 MG capsule, Take 1 capsule (40 mg total) by mouth 2 (two) times daily. Please call [phone]  to schedule an office visit for more refills., Disp: 30 capsule, Rfl: 3   ondansetron (ZOFRAN) 4 MG tablet, Take 1 tablet (4 mg total) by mouth every 6 (six) hours as needed for nausea., Disp: 20 tablet, Rfl: 0   ondansetron (ZOFRAN-ODT) 8 MG disintegrating tablet, Take 8 mg by mouth., Disp: , Rfl:    tamsulosin (FLOMAX) 0.4 MG CAPS capsule, Take 1 capsule (0.4 mg total) by mouth daily., Disp: 60 capsule, Rfl: 1   traMADol (ULTRAM) 50 MG tablet, Take 1 tablet (50 mg total) by mouth every 6 (six) hours as needed., Disp: 30 tablet, Rfl: 0  Facility-Administered Medications Ordered in Other Encounters:    sodium chloride flush (NS) 0.9 % injection 10 mL, 10 mL, Intravenous, PRN, Volanda Napoleon, MD, 10 mL at 10/21/19 0912  Exam: Current vital signs: BP (!) 149/99   Pulse 95   Temp 97.9 F (36.6 C) (Rectal)   Resp 18   Ht 5\' 7"  (1.702 m)   Wt 67 kg   SpO2 97%   BMI 23.13 kg/m  Vital signs in last 24 hours: Temp:  [97.9 F (36.6 C)] 97.9 F (36.6 C) (06/28 1221) Pulse  Rate:  [33-101] 95 (06/28 1336) Resp:  [15-23] 18 (06/28 1336) BP: (149-157)/(94-100) 149/99 (06/28 1334) SpO2:  [95 %-98 %] 97 % (06/28 1336) Weight:  [43 kg] 67 kg (06/28 1131)  GENERAL: Frail patient laying in stretcher, awake, alert, in no acute distress Head: Normocephalic and atraumatic EENT: Dry mucous membranes, normal conjunctivae LUNGS: Normal respiratory effort, non-labored breathing on room air CV: Tachycardia on cardiac monitor ABDOMEN - Soft, non-distended Ext: warm, well perfused, without obvious deformity   NEURO:  Mental Status: Awake and alert, nonverbal / mute throughout assessment.  Unable to assess orientation due to patient mental status.  Patient does not follow any commands.  Cranial Nerves:  II: PERRL. Inconsistent blink to threat on the right without overt visual field cut. III, IV, VI: Partial left gaze palsy but he is able to overcome midline and attend to stimuli in right visual field. V: Unable to assess facial sensation to light touch due to patient's condition.  VII: Subtle right nasolabial fold flattening noted.  VIII: Unable to assess hearing due to patient's condition IX, X: Patient is mute throughout assessment, does not open mouth or follow commands for examiner to view palate elevation XI: Head appears midline XII: Patient does not protrude tongue to command Motor: Strength assessment limited by patient not following commands.  Right upper extremity appears weaker than left upper extremity. RUE with no effort against gravity, LUE with some effort against gravity but with drift to stretcher.  Bilateral lower extremities with some effort against gravity, but does not follow commands to sustain antigravity movement. Bilateral lower extremities with brief, spontaneous antigravity movements throughout assessment.  Tone is normal. Bulk is decreased. Sensation: Intact to light touch bilaterally in all four extremities. No extinction to DSS present.   Coordination: FTN intact bilaterally. HKS intact bilaterally. No pronator drift. Alternating hand movements.  DTRs: 2+ throughout.  Gait: Deferred  NIHSS: 1a Level of Conscious.: 0 1b LOC Questions: 2 1c LOC Commands: 2 2 Best Gaze: 1; partial left gaze palsy 3 Visual: 0 4 Facial Palsy: 1; left nasolabial fold flattening 5a Motor Arm - left: 2 5b Motor Arm - Right: 3 6a Motor Leg - Left: 2 6b Motor Leg - Right: 2 7 Limb Ataxia: 0 8 Sensory: 0 9 Best Language: 3 10 Dysarthria: 2  11 Extinct. and Inatten.: 0 TOTAL: 20  Labs I have reviewed labs in epic and the results pertinent to this consultation are: CBC    Component Value Date/Time   WBC 6.2 12/01/2020 1050   RBC 3.43 (L) 12/01/2020 1050   HGB 10.5 (L) 12/01/2020 1053   HGB 8.5 (L) 11/24/2020 0830   HGB 16.1 01/30/2017 0804   HCT 31.0 (L) 12/01/2020 1053   HCT 44.9 01/30/2017 0804   PLT PENDING 12/01/2020 1050   PLT 97 (L) 11/24/2020 0830   PLT 186 01/30/2017 0804   MCV 96.2 12/01/2020 1050   MCV 101 (H) 01/30/2017 0804   MCH 33.5 12/01/2020 1050   MCHC 34.8 12/01/2020 1050   RDW 17.8 (H) 12/01/2020 1050   RDW 11.8 01/30/2017 0804   LYMPHSABS 1.9 11/24/2020 0830   LYMPHSABS 1.8 01/30/2017 0804   MONOABS 0.9 11/24/2020 0830   EOSABS 0.1 11/24/2020 0830   EOSABS 0.1 01/30/2017 0804   BASOSABS 0.0 11/24/2020 0830   BASOSABS 0.0 01/30/2017 0804   CMP     Component Value Date/Time   NA 134 (L) 12/01/2020 1053   NA 142 01/30/2017 0804   K 4.1 12/01/2020 1053   K 4.1 01/30/2017 0804   CL 98 12/01/2020 1053   CL 105 01/25/2016 0926   CO2 25 11/24/2020 0830   CO2 29 01/30/2017 0804   GLUCOSE 116 (H) 12/01/2020 1053   GLUCOSE 105 01/30/2017 0804   GLUCOSE 113 01/25/2016 0926   BUN 7 (L) 12/01/2020 1053   BUN 6.9 (L) 01/30/2017 0804   CREATININE 1.60 (H) 12/01/2020 1053   CREATININE 1.53 (H) 11/24/2020 0830   CREATININE 0.8 01/30/2017 0804   CALCIUM 8.9 11/24/2020 0830   CALCIUM 10.5 (H) 01/30/2017 0804    PROT 6.3 (L) 11/24/2020 0830   PROT 7.9 01/30/2017 0804   ALBUMIN 4.0 11/24/2020 0830   ALBUMIN 4.5 01/30/2017 0804   AST 13 (L) 11/24/2020 0830   AST 34 01/30/2017 0804   ALT 7 11/24/2020 0830   ALT 18 01/30/2017 0804   ALKPHOS 97 11/24/2020 0830   ALKPHOS 79 01/30/2017 0804   BILITOT 0.4 11/24/2020 0830   BILITOT 1.49 (H) 01/30/2017 0804   GFRNONAA 48 (L) 11/24/2020 0830   GFRAA >60 03/02/2020 1135   Imaging I have reviewed the images obtained: CT Head Code Stroke 12/01/2020: 1. No evidence of acute intracranial abnormality. 2. ASPECTS is 10. 3. Post radiation changes in the cerebral white matter.  CT angio head and neck 12/01/2020: No acute intracranial large or medium vessel occlusion. No correctable proximal stenosis. Atherosclerotic calcification at both carotid bifurcations but without stenosis.  EEG 12/01/2020: "This study showed lateralized rhythmic delta activity in the left hemisphere which can be seen due to underlying structural abnormality, postictal state.  Of note, lateralized rhythmic delta activity is also on the ictal-interictal continuum with low potential for seizures.  Additionally EEG suggestive of moderate diffuse encephalopathy, nonspecific etiology but most likely due to medications, postictal state.  No definite seizures were seen during this time."  Assessment: 70 y.o. with PMHx of HTN, malignant melanoma of left lower leg, small cell lung cancer with metastasis to the liver and brain on chemotherapy, and possible seizures 2/2 brain metastasis who presented to the ED as a Code Stroke for evaluation of acute aphasia / nonverbal state and left-sided weakness. LKW 10:00 this morning.  - Examination reveals patient with left > right arm weakness, aphasia, and with a partial left gaze preference. His initial NIHSS  was 20.  - CT imaging was obtained with no evidence of acute intracranial abnormality and vessel imaging was without large or medium vessel occlusion.  There was some evidence of atherosclerotic calcification at both carotid bifurcations but without stenosis. MRI brain imaging with and without contrast ordered STAT for further evaluation of brain metastasis. IV Decadron given due to known history of brain metastasis. - tPA not given due to history of brain metastasis, thrombocytopenia  - Presentation was felt to be consistent with  seizure with post-ictal state. He was given 1 mg Ativan IV, a 2,000 mg loading dose of Keppra, and a STAT EEG was ordered for further evaluation. EEG showed lateralized rhythmic delta activity in the left hemisphere which can be seen due to underlying structural abnormality or postictal state. There were no definite seizures seen throughout the recording.   Clinically, did not improve after Keppra and benzodiazepine administration.  Additional dose of Ativan 1 mg x 1 was ordered as well as a fosphenytoin load.  Sent for stat MRI and will hook him up to LTM EEG once back from MRI.  Impression: Possible breakthrough seizure versus nonconvulsive status epilepticus-: Seizures in the setting of small cell lung carcinoma metastasis to the brain Small cell lung cancer with metastasis to lung, brain, live Malignant melanoma of the left lower leg  Recommendations: - STAT EEG-completed and reviewed as above. - MRI brain with and without contrast - Decadron 10 mg IV once - Keppra 2,000 mg IV loading dose, no clinical improvement-this was followed by fosphenytoin 20 mg/kg phenytoin equivalent x1. -Increase home dose of Keppra to 1000 mg twice daily- Inpatient seizure precautions - 2 mg Ativan IV for any seizure lasting > 5 minutes and notify neurology  Anibal Henderson, AGAC-NP [facility] Pager: [phone]   Attending Neurohospitalist Addendum Patient seen and examined with APP/Resident. Agree with the history and physical as documented above. Agree with the plan as documented, which I helped formulate. I  have independently reviewed the chart, obtained history, review of systems and examined the patient.I have personally reviewed pertinent head/neck/spine imaging (CT/MRI).  -- Amie Portland, MD Neurologist [facility] Pager: [phone]  CRITICAL CARE ATTESTATION Performed by: Amie Portland, MD Total critical care time: 75 minutes Critical care time was exclusive of separately billable procedures and treating other patients and/or supervising APPs/Residents/Students Critical care was necessary to treat or prevent imminent or life-threatening deterioration due to possible seizure and status epilepticus, brain metastases, strokelike symptoms This patient is critically ill and at significant risk for neurological worsening and/or death and care requires constant monitoring. Critical care was time spent personally by me on the following activities: development of treatment plan with patient and/or surrogate as well as nursing, discussions with consultants, evaluation of patient's response to treatment, examination of patient, obtaining history from patient or surrogate, ordering and performing treatments and interventions, ordering and review of laboratory studies, ordering and review of radiographic studies, pulse oximetry, re-evaluation of patient's condition, participation in multidisciplinary rounds and medical decision making of high complexity in the care of this patient.

## 2020-12-01 NOTE — H&P (Signed)
NAME:  Walter Hernandez, MRN:  [id number], DOB:  1950/07/06, LOS: 0 ADMISSION DATE:  12/01/2020, CONSULTATION DATE:  6/28 REFERRING MD:  Walter Skeeter MD, CHIEF COMPLAINT:  Seizure   History of Present Illness:  Patient is encephalopathic. Therefore history has been obtained from chart review and from wife at bedside.  Walter Hernandez is an 70 y.o. who presented to the [facility] ED via EMS with concern for seizure  Mr  Walter Hernandez lives at home with his wife. 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  Per his wife, Walter Hernandez is a patient of Dr. Marin Hernandez and he was diagnosed with small cell lung cancer with mets to the brain and liver in December of 2020 and started chemotherapy in February of 2021. An MRI in February of 2022 showed worsening brain mets. He underwent salvage SRS on 2/16. He was admitted for seizures on 09/05/20. MRI in march of 2022 showed worsening brain mets again. He underwent SRS a second time on 10/05/2020. In his last visit with Dr. Marin Hernandez on 11/11/2020 chemotherapy was stopped due to the patient having some difficulty with the thearpy.  On 6/28 the patient had woken up at 7AM, expressed some fatigue, and desire to take a nap around 10 AM. A short time later he was confused, not following commands, and aphasic. EMS was called.  Presentation at [facility] was notable for left gaze preference, aphasia, left side weakness. A code stroke ws called and Neurology was consulted. NIHSS score was 20. CT head was negative for acute abnormality. MRI was negative for acute infarct. IV decadron was given due to history of brain mets. Presentation was then felt to be from seizure with post-ictal state. 1 MG ativan was given. EEG showed lateralized rhythmic delta activity in the left hemisphere. There was no clinical improvement post ativan so another 2mg  ativan along with 200mg  Vimpat, 2000mg  keppra, and 20mg /kg fosphenytoin were given per documentation  in the ED.   PCCM was consulted for admission and consideration for intubation.  Pertinent  Medical History  Active small cell lung cancer with mets to brain and liver, HTN, LLE malignant melanoma  Significant Hospital Events: Including procedures, antibiotic start and stop dates in addition to other pertinent events   6/28 Presented to [facility] with concern for seizure. CT head no acute abnormalities. MRI no infarction. EEG>  Interim History / Subjective:  See above  Unable to obtain a subjective exam due to patient status.   Objective   Blood pressure (!) 155/91, pulse 96, temperature 97.9 F (36.6 C), temperature source Rectal, resp. rate 16, height 5\' 7"  (1.702 m), weight 67 kg, SpO2 97 %.        Intake/Output Summary (Last 24 hours) at 12/01/2020 1603 Last data filed at 12/01/2020 1436 Gross per 24 hour  Intake 150 ml  Output --  Net 150 ml   Filed Weights   12/01/20 1000 12/01/20 1131  Weight: 67 kg 67 kg    Examination: General:  ill appearing, in bed, frail HEENT: MM pink/moist, icteric/anicteric, atraumatic Neuro: GCS 10, opens eyes to voice, incomprehensible, localizes with rt arm, RASS, PERRL 61mm, leftward gaze deviation CV: S1S2, NSR, no m/r/g appreciated PULM:  clear in the upper lobes and in the lower lobes,  trachea midline , chest expansion symmetric GI: soft, bsx4 active. Non distended Extremities: warm/dry, no pretibial edema, capillary refill less than 3 seconds  Skin: no rashes or  lesions  Resolved Hospital Problem list     Assessment & Plan:  Seizure VS Worsening of brain metastasis from Small Cell Lung Cancer Acute Encephalopathy Hx Seizure Seizures on 09/05/2020. On Keppra 500mg  BID at home. 3mg  ativan, 200mg  Vimpat, 2000mg  keppra, and 20mg /kg fosphenytoin given per documentation in the ED. MRI does not demonstrate any infarction. ?medication vs post-ictal vs worsening brain mets. -Admit to ICU with tele and spo2 monitoring -Appreciate Neurology  assistance and workup -Continue EEG -Will discuss brain MRI with Dr. Tacy Hernandez. -AED per neuro: Keppra 1000mg  BID, Dilantin 100mg  q8h. Neuro considering adding Vimpat. -Decadron 10mg  IV once -PRN ativan 2mg  IV for seizures lasting longer than 5 minutes. Notify neurology if this occurs -Frequent neurochecks. -Dr. Tacy Hernandez and I discussed the risks and benefits regarding endotracheal intubation and mechanical ventilation for Walter Hernandez. There is a risk with his deconditioning that if MV is initiatied that we may not be able liberate him from ventilation. His Wife Walter Hernandez is unsure of what he would want in this scenario. We hold on intubation at this time and continue to follow his neurological and respiratory status. I encouraged Walter Hernandez to talk to their family to discuss what Walter Hernandez's wishes would be.  Hyponatremia NA 134 -Monitor on AM BMP -No free water -Keep NPO  H/o BPH Was on flomax -watch I&O -PRN bladder scan -May need foley if issues w/ urinary retention off flomax  Normocytic Anemia- suspect chronic secondary to chemo Thrombocytopenia Hgb 11.5, PLT 104 -Transfuse PRBC if HBG less than 7 -Obtain AM CBC to trend H&H   Hx of Active Small Cell Lung Cancer with Metasis to Brain and Liver, also remote h/o melanoma.  Had out-pt palliative consultation ordered but not completed as had not been tolerating systemic therapy  Followed by Dr. Marin Hernandez. Systemic chemothearpy held on 11/11/2020 -Will consult palliative care tomorrow pending discussion with family tonight.   Best Practice (right click and "Reselect all SmartList Selections" daily)   Diet/type: NPO DVT prophylaxis: SCD GI prophylaxis: PPI Lines: N/A Foley:  N/A Code Status:  full code Last date of multidisciplinary goals of care discussion [6/28 in ED with Wife]  Labs   CBC: Recent Labs  Lab 12/01/20 1050 12/01/20 1053  WBC 6.2  --   NEUTROABS 2.7  --   HGB 11.5* 10.5*  HCT 33.0* 31.0*  MCV 96.2  --   PLT 104*  --      Basic Metabolic Panel: Recent Labs  Lab 12/01/20 1050 12/01/20 1053  NA 134* 134*  K 4.0 4.1  CL 97* 98  CO2 25  --   GLUCOSE 120* 116*  BUN 7* 7*  CREATININE 1.69* 1.60*  CALCIUM 9.1  --    GFR: Estimated Creatinine Clearance: 39.6 mL/min (A) (by C-G formula based on SCr of 1.6 mg/dL (H)). Recent Labs  Lab 12/01/20 1050  WBC 6.2    Liver Function Tests: Recent Labs  Lab 12/01/20 1050  AST 19  ALT 11  ALKPHOS 77  BILITOT 0.6  PROT 5.8*  ALBUMIN 3.4*   No results for input(s): LIPASE, AMYLASE in the last 168 hours. No results for input(s): AMMONIA in the last 168 hours.  ABG    Component Value Date/Time   TCO2 25 12/01/2020 1053     Coagulation Profile: Recent Labs  Lab 12/01/20 1050  INR 1.0    Cardiac Enzymes: No results for input(s): CKTOTAL, CKMB, CKMBINDEX, TROPONINI in the last 168 hours.  HbA1C: No results found for: HGBA1C  CBG:  Recent Labs  Lab 12/01/20 1050  GLUCAP 106*    Review of Systems:   Unable to obtain a review of systems due to patient status  Past Medical History:  He,  has a past medical history of Cancer ([place]), GERD (gastroesophageal reflux disease), Goals of care, counseling/discussion (06/19/2019), History of hiatal hernia, Hypertension, and Malignant melanoma of skin of left lower leg ([place]) (07/28/2015).   Surgical History:   Past Surgical History:  Procedure Laterality Date   BRONCHIAL NEEDLE ASPIRATION BIOPSY  06/05/2019   Procedure: Bronchial Needle Aspiration Biopsies;  Surgeon: Candee Furbish, MD;  Location: [place];  Service: Thoracic;;   CARDIAC CATHETERIZATION     15 yrs ago...came out normal   -- hiatal hernia   COLONOSCOPY  2012   Dr Earlean Shawl    ENDOBRONCHIAL ULTRASOUND N/A 06/11/2019   Procedure: ENDOBRONCHIAL ULTRASOUND;  Surgeon: Garner Nash, DO;  Location: [place];  Service: Endoscopy;  Laterality: N/A;   ESOPHAGOGASTRODUODENOSCOPY  2012   Dr Earlean Shawl. has a really bad hiatal hernia    FINE  NEEDLE ASPIRATION  06/11/2019   Procedure: FINE NEEDLE ASPIRATION (FNA) LINEAR;  Surgeon: Garner Nash, DO;  Location: [place];  Service: Endoscopy;;   IR IMAGING GUIDED PORT INSERTION  07/03/2019   MELANOMA EXCISION WITH SENTINEL LYMPH NODE BIOPSY Left 09/29/2014   Procedure: WIDE EXCISION MELANOMA LEFT CALF WITH LEFT INGUINAL  SENTINEL LYMPH NODE BIOPSY;  Surgeon: Georganna Skeans, MD;  Location: [place];  Service: General;  Laterality: Left;   skin lesion excised     non cancerous   VIDEO BRONCHOSCOPY N/A 06/11/2019   Procedure: VIDEO BRONCHOSCOPY WITHOUT FLUORO;  Surgeon: Garner Nash, DO;  Location: [place];  Service: Endoscopy;  Laterality: N/A;   VIDEO BRONCHOSCOPY WITH ENDOBRONCHIAL ULTRASOUND N/A 06/05/2019   Procedure: VIDEO BRONCHOSCOPY WITH ENDOBRONCHIAL ULTRASOUND;  Surgeon: Candee Furbish, MD;  Location: [place];  Service: Thoracic;  Laterality: N/A;     Social History:   reports that he has quit smoking. His smoking use included cigarettes. He has a 50.00 pack-year smoking history. He has never used smokeless tobacco. He reports previous alcohol use. He reports that he does not use drugs.   Family History:  His family history is negative for Colon cancer and Esophageal cancer.   Allergies Allergies  Allergen Reactions   Other Itching and Other (See Comments)    Seasonal allergies: Sinus headaches, sneezing, itchy eyes     Home Medications  Prior to Admission medications   Medication Sig Start Date End Date Taking? Authorizing Provider  acetaminophen (TYLENOL) 500 MG tablet Take 1,000 mg by mouth every 6 (six) hours as needed (for headaches).   Yes [provider]  ALPRAZolam (XANAX) 0.5 MG tablet Take 1 tablet (0.5 mg total) by mouth every 6 (six) hours as needed for anxiety. 06/10/19  Yes Volanda Napoleon, MD  diphenoxylate-atropine (LOMOTIL) 2.5-0.025 MG tablet TAKE 2 TABLETS AT ONSET OF DIARRHEA. TAKE 1 TABLET AFTER EACH LOOSE STOOL. MAX 8 TABLETS PER  DAY. Patient taking differently: Take 1-2 tablets by mouth See admin instructions. Take 2 tablets by mouth at onset of diarrhea and 1 tablet after each loose stool- max of 8 tablets/day 11/10/20  Yes Ennever, Rudell Cobb, MD  dronabinol (MARINOL) 5 MG capsule Take 1 capsule (5 mg total) by mouth 2 (two) times daily before a meal. Patient taking differently: Take 5 mg by mouth 2 (two) times daily as needed (for appetite). 09/10/19  Yes [place],  Holli Humbles, NP  finasteride (PROSCAR) 5 MG tablet TAKE 1 TABLET BY MOUTH EVERY DAY Patient taking differently: Take 5 mg by mouth daily. 09/07/20  Yes Volanda Napoleon, MD  levETIRAcetam (KEPPRA) 500 MG tablet Take 1 tablet (500 mg total) by mouth 2 (two) times daily. 09/22/20  Yes Ennever, Rudell Cobb, MD  metroNIDAZOLE (FLAGYL) 500 MG tablet Take 1 tablet (500 mg total) by mouth 3 (three) times daily. 11/24/20  Yes [place], Holli Humbles, NP  Multiple Vitamin (MULTIVITAMIN) capsule Take 1 capsule by mouth daily.   Yes [provider]  Naphazoline-Pheniramine (OPCON-A OP) Place 2 drops into both eyes 3 (three) times daily as needed (for itching).   Yes [provider]  omeprazole (PRILOSEC) 40 MG capsule Take 1 capsule (40 mg total) by mouth 2 (two) times daily. Please call [phone] to schedule an office visit for more refills. Patient taking differently: Take 40 mg by mouth 2 (two) times daily. 10/21/20  Yes Jackquline Denmark, MD  ondansetron (ZOFRAN) 4 MG tablet Take 1 tablet (4 mg total) by mouth every 6 (six) hours as needed for nausea. 06/05/19  Yes Little Ishikawa, MD  tamsulosin (FLOMAX) 0.4 MG CAPS capsule Take 1 capsule (0.4 mg total) by mouth daily. Patient taking differently: Take 0.4 mg by mouth 3 (three) times a week. 01/21/20  Yes Ennever, Rudell Cobb, MD  traMADol (ULTRAM) 50 MG tablet Take 1 tablet (50 mg total) by mouth every 6 (six) hours as needed. Patient taking differently: Take 50 mg by mouth every 6 (six) hours as needed (for headaches).  11/11/20  Yes [place], Holli Humbles, NP  dexamethasone (DECADRON) 4 MG tablet Dexamethasone 4 mg TID x 2 days, BID x 2 days, then 2 mg (1/2 tab) BID x 7 days, then 2 mg (1/2 tab) QD x 7 days then stop. Patient not taking: No sig reported 07/27/20   Hayden Pedro, PA-C  dexamethasone (DECADRON) 4 MG tablet Take 2 tablets (8 mg total) by mouth 2 (two) times daily. You must take with food in the morning and at dinner. Patient not taking: No sig reported 09/22/20   Volanda Napoleon, MD  furosemide (LASIX) 20 MG tablet Take 2 tablets (40 mg total) by mouth daily. Patient not taking: Reported on 12/01/2020 08/01/19   Volanda Napoleon, MD  prochlorperazine (COMPAZINE) 10 MG tablet Take 1 tablet (10 mg total) by mouth every 6 (six) hours as needed (Nausea or vomiting). 03/23/20 11/17/20  Volanda Napoleon, MD     Critical care time: 9 Minutes   Daley Gosse Chancy Milroy., MSN, APRN, AGACNP-BC [facility]  12/01/2020 , 4:03 PM  Please see [email or web address] for pager details  If no response, please call [phone] After hours, please call Elink at [phone]

## 2020-12-01 NOTE — Hospital Course (Addendum)
   Principal Problem:   Seizure ([place]) Active Problems:   Acute metabolic encephalopathy with right sided weakness and acute aphasia    Acute respiratory failure with hypoxia ([place])   Malignant melanoma of skin of left lower leg (HCC)   Extensive stage primary small cell carcinoma of lung (HCC) with known metastasis to brain   Goals of care, counseling/discussion   Benign prostatic hyperplasia without lower urinary tract symptoms   CKD (chronic kidney disease) stage 3, GFR 30-59 ml/min (HCC)   Alteration in electrolyte and fluid balance   Essential hypertension   GERD (gastroesophageal reflux disease)   Thrombocytopenia [facility])  Hospital course  6/28: Presentation at [facility] was notable for left gaze preference, aphasia, left side weakness. A code stroke ws called and Neurology was consulted. NIHSS score was 20. CT head was negative for acute abnormality. MRI was negative for acute infarct. IV decadron was given due to history of brain mets. Presentation was then felt to be from seizure with post-ictal state. Admitted. 2mg  lorazepam. Loaded w/ dilantin and vimpat as well as cont. Keppra. Started on LTM. Shortly after arrival to ICU was hypoxic and had episodes of apnea so intubated and placed on mechanical vent.    Diet/type: NPO Activity and progression: Bed Rest VAP bundle: Not indicated PAD protocol: Not indicated DVT prophylaxis: SCD Glycemic control: not indicated GI prophylaxis: PPI Lines: N/A Foley:  N/A Culture data: none  Antibiotics: not indicated.  Stop date: N/A Pending studies: EEG Code status: ccm code plan: Full code Disposition: admit to the intensive care     Last date of multidisciplinary goals of care discussion [pending]

## 2020-12-01 NOTE — ED Triage Notes (Signed)
Pt arrived via [facility] from home for aphasia and left gaze preference started today after waking up from nap per wife. Pt LKW 1000 today. Per EMS pt has been on atx for a week for diarrhea. Pt does not follow commands. Pt vss.

## 2020-12-01 NOTE — Assessment & Plan Note (Signed)
Hypokalemia Plan Replace and recheck

## 2020-12-01 NOTE — Assessment & Plan Note (Signed)
Secondary to post-ictal state Plan Supportive care  Pulse ox  Serial neuro checks

## 2020-12-01 NOTE — Assessment & Plan Note (Signed)
This had been ordered by oncology but had yet to occur in the outpt setting Plan Will have the in-pt palliative team see

## 2020-12-01 NOTE — ED Notes (Signed)
Unable to get pt to sign MSE, no family present

## 2020-12-01 NOTE — Code Documentation (Signed)
Stroke Response Nurse Documentation Code Documentation  Walter Hernandez is a 70 y.o. male arriving to [place] ED via [facility] on 12/01/2020 with past medical hx of lung cancer metastasize to the brain. Code stroke was activated by EMS. Patient from home where he was LKW at 1000 when he fell asleep for a nap. He woke up shortly after and was noted to be looking to the left and not speaking. Wife called EMS and [facility] activated a Code Stroke.    Stroke team at the bedside on patient arrival. Labs drawn and patient cleared for CT by Dr. Regenia Skeeter. Patient to CT with team. NIHSS 20, see documentation for details and code stroke times. Patient with disoriented, not following commands, left gaze preference , right facial droop, bilateral arm weakness, bilateral leg weakness, Global aphasia , and dysarthria  on exam. The following imaging was completed:  CT, CTA. Patient is not a candidate for tPA due to contraindicated with brain metastasis.  Care/Plan: STAT EEG, q2 mNIHSS/VS. Bedside handoff with ED RN Lorrin Goodell.    Kathrin Greathouse  Stroke Response RN

## 2020-12-01 NOTE — Progress Notes (Addendum)
Same day progress note I had a detailed discussion with the wife after the MRI which was reviewed with the radiologist. The MRI does not show any acute or subacute infarction. There is resolution of abnormal enhancement seen on study 2 months ago within the insula so white matter on the right and along the cortical surface of the brain in the left posterior frontal vertex. There is enlargement of what was a small punctate focus of enhancement in the left temporal lobe which is now measuring 6 x 7 mm today favored to represent a metastasis. There is recurrently and enhancement in the midbrain on the right and recurrent edema in the right midbrain and medial thalamus.  No enhancement in the medial thalamus.  These areas had normalized on the study on 09/21/2020 were normal on the study on 04/09/2020 but showed striking enhancement on the study of 07/10/2020.  Question whether this is waxing and waning treatment effect.  EEG concerning for left hemispheric dysfunction. Clinically, patient remains aphasic with left gaze preference and right mild hemiparesis and right homonymous hemianopsia. I discussed with the wife in detail the treatment plan.  She wants the patient to be full code. I told her that my neck step would be to introduce a third antiepileptic-Vimpat and continue him on Keppra, Vimpat as well as Dilantin. Next step would be to intubate him and put him in burst suppression and if he does not improve. I did explain to the wife that the recurrent brain metastases are not good prognostic indicators from a neurologically meaningful recovery but she would like to at least proceed to try to get him back to where he was prior to the hospital presentation which was conversant, weak in the lower extremities but still able to enjoy time with his family. I have discussed my plan with the ICU attending Dr. Tacy Learn. Have also reached out to neurooncology for any further recommendations.  -- Amie Portland,  MD Neurologist [facility] Pager: [phone]

## 2020-12-01 NOTE — Procedures (Signed)
Intubation Procedure Note  Walter Hernandez  [id number]  [birth year]  Date:12/01/20  Time:5:38 PM   Provider Performing:Siani Utke Chauncey Cruel Iona Beard    Procedure: Intubation (10175)  Indication(s) Respiratory Failure  Consent Unable to obtain consent due to emergent nature of procedure.   Anesthesia Etomidate and Rocuronium   Time Out Verified patient identification, verified procedure, site/side was marked, verified correct patient position, special equipment/implants available, medications/allergies/relevant history reviewed, required imaging and test results available.   Sterile Technique Usual hand hygeine, masks, and gloves were used   Procedure Description Patient positioned in bed supine.  Sedation given as noted above.  Patient was intubated with endotracheal tube using Glidescope.  View was Grade 1 full glottis .  Number of attempts was 1.  Colorimetric CO2 detector was consistent with tracheal placement.   Complications/Tolerance None; patient tolerated the procedure well. Chest X-ray is ordered to verify placement.   EBL Minimal   Specimen(s) None  Redmond School., MSN, APRN, AGACNP-BC [facility]  12/01/2020 , 5:39 PM  Please see [email or web address] for pager details  If no response, please call [phone] After hours, please call Elink at [phone]

## 2020-12-01 NOTE — Progress Notes (Signed)
EEG machine transported to 4N from ed.  Computer was switched out.  All leads attached, no skin breakdown seen. [facility] monitored, Event button test confirmed by [facility].

## 2020-12-01 NOTE — Assessment & Plan Note (Addendum)
In the context of known NSCLC metastasis  Seen by neurology Plan Seizure precautions Serial neuro checks  ICU monitoring w/ LTM AEDs administered thus far: vimpat, keppra and dilantin. Was on keppra PTA. Defer regimen to neurology  If seizures continue then plan is for burst suppression which will require airway stabilization

## 2020-12-01 NOTE — Procedures (Signed)
Patient Name: Walter Hernandez  MRN: [id number]  Epilepsy Attending: Lora Havens  Referring Physician/Provider: Anibal Henderson, NP Date: 12/01/2020 Duration: 22.02 mins  Patient history: 70 year old male presented with acute onset aphasia and left-sided weakness.  EEG to evaluate for seizures.  Level of alertness: Awake  AEDs during EEG study: Keppra, Ativan  Technical aspects: This EEG study was done with scalp electrodes positioned according to the 10-20 International system of electrode placement. Electrical activity was acquired at a sampling rate of 500Hz  and reviewed with a high frequency filter of 70Hz  and a low frequency filter of 1Hz . EEG data were recorded continuously and digitally stored.   Description: No posterior dominant rhythm was seen.  EEG showed continuous low amplitude 2 to 3 Hz delta slowing in left hemisphere as well as 5 to 6 Hz theta slowing in right hemisphere.  Hyperventilation and photic stimulation were not performed.     ABNORMALITY -Lateralized rhythmic delta activity, left hemisphere -Continuous slow, right hemisphere  IMPRESSION: This study showed lateralized rhythmic delta activity in the left hemisphere which can be seen due to underlying structural abnormality, postictal state.  Of note, lateralized rhythmic delta activity is also on the ictal-interictal continuum with low potential for seizures.  Additionally EEG suggestive of moderate diffuse encephalopathy, nonspecific etiology but most likely due to medications, postictal state.  No definite seizures were seen during this time.  Dr. Rory Percy was notified.  Ryelee Albee Barbra Sarks

## 2020-12-01 NOTE — ED Provider Notes (Signed)
[place] [facility] EMERGENCY DEPARTMENT Provider Note   CSN: [id number] Arrival date & time: 12/01/20  1043  LEVEL 5 CAVEAT - ALTERED MENTAL STATUS   History Chief Complaint  Patient presents with   Code Stroke    Walter Hernandez is a 70 y.o. male.  HPI 70 year old male presents with acute right-sided weakness.  Last seen normal around 10 AM. History of metastases to his brain. Further history is limited due to patient's acute AMS. History is obtained via EMS.  Past Medical History:  Diagnosis Date   Cancer [facility])    recently discovered melanoma   GERD (gastroesophageal reflux disease)    Goals of care, counseling/discussion 06/19/2019   History of hiatal hernia    Hypertension    recently placed on new blood pressure med   Malignant melanoma of skin of left lower leg ([place]) 07/28/2015    Patient Active Problem List   Diagnosis Date Noted   Goals of care, counseling/discussion 06/19/2019   Extensive stage primary small cell carcinoma of lung ([place]) 06/11/2019   Brain metastases ([place]) 06/03/2019   Essential hypertension 06/03/2019   GERD (gastroesophageal reflux disease) 06/03/2019   Malignant melanoma of skin of left lower leg ([place]) 07/28/2015    Past Surgical History:  Procedure Laterality Date   BRONCHIAL NEEDLE ASPIRATION BIOPSY  06/05/2019   Procedure: Bronchial Needle Aspiration Biopsies;  Surgeon: Candee Furbish, MD;  Location: [place];  Service: Thoracic;;   CARDIAC CATHETERIZATION     15 yrs ago...came out normal   -- hiatal hernia   COLONOSCOPY  2012   Dr Earlean Shawl    ENDOBRONCHIAL ULTRASOUND N/A 06/11/2019   Procedure: ENDOBRONCHIAL ULTRASOUND;  Surgeon: Garner Nash, DO;  Location: [place];  Service: Endoscopy;  Laterality: N/A;   ESOPHAGOGASTRODUODENOSCOPY  2012   Dr Earlean Shawl. has a really bad hiatal hernia    FINE NEEDLE ASPIRATION  06/11/2019   Procedure: FINE NEEDLE ASPIRATION (FNA) LINEAR;  Surgeon: Garner Nash, DO;  Location: [place];   Service: Endoscopy;;   IR IMAGING GUIDED PORT INSERTION  07/03/2019   MELANOMA EXCISION WITH SENTINEL LYMPH NODE BIOPSY Left 09/29/2014   Procedure: WIDE EXCISION MELANOMA LEFT CALF WITH LEFT INGUINAL  SENTINEL LYMPH NODE BIOPSY;  Surgeon: Georganna Skeans, MD;  Location: [place];  Service: General;  Laterality: Left;   skin lesion excised     non cancerous   VIDEO BRONCHOSCOPY N/A 06/11/2019   Procedure: VIDEO BRONCHOSCOPY WITHOUT FLUORO;  Surgeon: Garner Nash, DO;  Location: [place];  Service: Endoscopy;  Laterality: N/A;   VIDEO BRONCHOSCOPY WITH ENDOBRONCHIAL ULTRASOUND N/A 06/05/2019   Procedure: VIDEO BRONCHOSCOPY WITH ENDOBRONCHIAL ULTRASOUND;  Surgeon: Candee Furbish, MD;  Location: [facility] OR;  Service: Thoracic;  Laterality: N/A;       Family History  Problem Relation Age of Onset   Colon cancer Neg Hx    Esophageal cancer Neg Hx     Social History   Tobacco Use   Smoking status: Former    Packs/day: 1.00    Years: 50.00    Pack years: 50.00    Types: Cigarettes   Smokeless tobacco: Never   Tobacco comments:    quit  06/03/2019  Vaping Use   Vaping Use: Never used  Substance Use Topics   Alcohol use: Not Currently    Alcohol/week: 0.0 standard drinks    Comment: socially....beer  wine & liquor   Drug use: No    Home Medications Prior to Admission medications  Medication Sig Start Date End Date Taking? Authorizing Provider  acetaminophen (TYLENOL) 500 MG tablet Take 1,000 mg by mouth every 6 (six) hours as needed (for headaches).   Yes [provider]  ALPRAZolam (XANAX) 0.5 MG tablet Take 1 tablet (0.5 mg total) by mouth every 6 (six) hours as needed for anxiety. 06/10/19  Yes Volanda Napoleon, MD  diphenoxylate-atropine (LOMOTIL) 2.5-0.025 MG tablet TAKE 2 TABLETS AT ONSET OF DIARRHEA. TAKE 1 TABLET AFTER EACH LOOSE STOOL. MAX 8 TABLETS PER DAY. Patient taking differently: Take 1-2 tablets by mouth See admin instructions. Take 2 tablets by mouth at onset  of diarrhea and 1 tablet after each loose stool- max of 8 tablets/day 11/10/20  Yes Ennever, Rudell Cobb, MD  dronabinol (MARINOL) 5 MG capsule Take 1 capsule (5 mg total) by mouth 2 (two) times daily before a meal. Patient taking differently: Take 5 mg by mouth 2 (two) times daily as needed (for appetite). 09/10/19  Yes [place], Holli Humbles, NP  finasteride (PROSCAR) 5 MG tablet TAKE 1 TABLET BY MOUTH EVERY DAY Patient taking differently: Take 5 mg by mouth daily. 09/07/20  Yes Volanda Napoleon, MD  levETIRAcetam (KEPPRA) 500 MG tablet Take 1 tablet (500 mg total) by mouth 2 (two) times daily. 09/22/20  Yes Ennever, Rudell Cobb, MD  metroNIDAZOLE (FLAGYL) 500 MG tablet Take 1 tablet (500 mg total) by mouth 3 (three) times daily. 11/24/20  Yes [place], Holli Humbles, NP  Multiple Vitamin (MULTIVITAMIN) capsule Take 1 capsule by mouth daily.   Yes [provider]  Naphazoline-Pheniramine (OPCON-A OP) Place 2 drops into both eyes 3 (three) times daily as needed (for itching).   Yes [provider]  omeprazole (PRILOSEC) 40 MG capsule Take 1 capsule (40 mg total) by mouth 2 (two) times daily. Please call [phone] to schedule an office visit for more refills. Patient taking differently: Take 40 mg by mouth 2 (two) times daily. 10/21/20  Yes Walter Denmark, MD  ondansetron (ZOFRAN) 4 MG tablet Take 1 tablet (4 mg total) by mouth every 6 (six) hours as needed for nausea. 06/05/19  Yes Little Ishikawa, MD  tamsulosin (FLOMAX) 0.4 MG CAPS capsule Take 1 capsule (0.4 mg total) by mouth daily. Patient taking differently: Take 0.4 mg by mouth 3 (three) times a week. 01/21/20  Yes Ennever, Rudell Cobb, MD  traMADol (ULTRAM) 50 MG tablet Take 1 tablet (50 mg total) by mouth every 6 (six) hours as needed. Patient taking differently: Take 50 mg by mouth every 6 (six) hours as needed (for headaches). 11/11/20  Yes [place], Holli Humbles, NP  dexamethasone (DECADRON) 4 MG tablet Dexamethasone 4 mg TID x 2 days, BID x 2  days, then 2 mg (1/2 tab) BID x 7 days, then 2 mg (1/2 tab) QD x 7 days then stop. Patient not taking: No sig reported 07/27/20   Hayden Pedro, PA-C  dexamethasone (DECADRON) 4 MG tablet Take 2 tablets (8 mg total) by mouth 2 (two) times daily. You must take with food in the morning and at dinner. Patient not taking: No sig reported 09/22/20   Volanda Napoleon, MD  furosemide (LASIX) 20 MG tablet Take 2 tablets (40 mg total) by mouth daily. Patient not taking: Reported on 12/01/2020 08/01/19   Volanda Napoleon, MD  prochlorperazine (COMPAZINE) 10 MG tablet Take 1 tablet (10 mg total) by mouth every 6 (six) hours as needed (Nausea or vomiting). 03/23/20 11/17/20  Volanda Napoleon, MD  Allergies    Other  Review of Systems   Review of Systems  Unable to perform ROS: Mental status change   Physical Exam Updated Vital Signs BP (!) 155/91   Pulse 96   Temp 97.9 F (36.6 C) (Rectal)   Resp 16   Ht 5\' 7"  (1.702 m)   Wt 67 kg   SpO2 97%   BMI 23.13 kg/m   Physical Exam Vitals and nursing note reviewed.  Constitutional:      Appearance: He is well-developed.  HENT:     Head: Normocephalic and atraumatic.     Right Ear: External ear normal.     Left Ear: External ear normal.     Nose: Nose normal.  Eyes:     General:        Right eye: No discharge.        Left eye: No discharge.  Cardiovascular:     Rate and Rhythm: Normal rate and regular rhythm.     Heart sounds: Normal heart sounds.  Pulmonary:     Effort: Pulmonary effort is normal.     Breath sounds: Normal breath sounds.  Abdominal:     Palpations: Abdomen is soft.     Tenderness: There is no abdominal tenderness.  Musculoskeletal:     Cervical back: Neck supple.  Skin:    General: Skin is warm and dry.  Neurological:     Mental Status: He is alert.     Comments: Eyes are open but patient has a gaze preference to the left.  He does seem to spontaneously move all 4 extremities.  Intermittently appears to  have seizure-like activity.  Psychiatric:        Mood and Affect: Mood is not anxious.    ED Results / Procedures / Treatments   Labs (all labs ordered are listed, but only abnormal results are displayed) Labs Reviewed  CBC - Abnormal; Notable for the following components:      Result Value   RBC 3.43 (*)    Hemoglobin 11.5 (*)    HCT 33.0 (*)    RDW 17.8 (*)    Platelets 104 (*)    All other components within normal limits  DIFFERENTIAL - Abnormal; Notable for the following components:   Abs Immature Granulocytes 0.26 (*)    All other components within normal limits  COMPREHENSIVE METABOLIC PANEL - Abnormal; Notable for the following components:   Sodium 134 (*)    Chloride 97 (*)    Glucose, Bld 120 (*)    BUN 7 (*)    Creatinine, Ser 1.69 (*)    Total Protein 5.8 (*)    Albumin 3.4 (*)    GFR, Estimated 43 (*)    All other components within normal limits  I-STAT CHEM 8, ED - Abnormal; Notable for the following components:   Sodium 134 (*)    BUN 7 (*)    Creatinine, Ser 1.60 (*)    Glucose, Bld 116 (*)    Hemoglobin 10.5 (*)    HCT 31.0 (*)    All other components within normal limits  CBG MONITORING, ED - Abnormal; Notable for the following components:   Glucose-Capillary 106 (*)    All other components within normal limits  SARS CORONAVIRUS 2 (TAT 6-24 HRS)  RESP PANEL BY RT-PCR (FLU A&B, COVID) ARPGX2  PROTIME-INR  APTT  PHENYTOIN LEVEL, TOTAL    EKG EKG Interpretation  Date/Time:  Tuesday December 01 2020 11:17:09 EDT Ventricular Rate:  108  PR Interval:  60 QRS Duration: 143 QT Interval:  344 QTC Calculation: 462 R Axis:   248 Text Interpretation: Sinus tachycardia RBBB and LAFB Abnormal T, consider ischemia, lateral leads Confirmed by Sherwood Gambler [phone]) on 12/01/2020 1:46:12 PM  Radiology MR BRAIN W WO CONTRAST  Result Date: 12/01/2020 CLINICAL DATA:  Stroke versus seizure presentation patient being treated for metastatic lung cancer. EXAM: MRI  HEAD WITHOUT AND WITH CONTRAST TECHNIQUE: Multiplanar, multiecho pulse sequences of the brain and surrounding structures were obtained without and with intravenous contrast. CONTRAST:  65mL GADAVIST GADOBUTROL 1 MMOL/ML IV SOLN COMPARISON:  CT studies earlier same day. MRI 09/21/2020. multiple prior MRI studies as distant as 06/03/2019. FINDINGS: Brain: Today's study is considerably degraded by motion. There is recurrent edema seen within the right mid brain and medial thalamus, which had resolved on the study of April 18. There is some linear enhancement in the right mid brain. No enhancement in the medial thalamus. These findings could represent some combination of treatment effect and possibly recurrent disease in the mid brain. There is enlargement of an enhancing focus within the left temporal lobe just inferior and lateral to the temporal horn of the lateral ventricle. This was present as a punctate focus of enhancement on the study of 2 months ago but today measures 6 x 7 mm. This is most likely a metastatic focus. No other enhancing foci are evident. Multiple previously seen metastatic foci no longer show mass effect or enhancement. Strikingly, enhancement at the left posterior frontal vertex along the cortical surfaces and within the subinsular white matter on the right have completely resolved. Widespread T2 and FLAIR signal throughout the white matter is largely similar to the previous study otherwise. No evidence of hemorrhage or hydrocephalus. No restricted diffusion to suggest acute or subacute infarction. Vascular: Major vessels at the base of the brain show flow. Skull and upper cervical spine: Negative Sinuses/Orbits: Clear/normal Other: None IMPRESSION: Diffusion imaging does not show any acute or subacute infarction. Resolution of abnormal enhancement seen on the study of 2 months ago within the subinsular white matter on the right and along the cortical surface of the brain at the left posterior  frontal vertex. Enlargement of what was a punctate focus of enhancement in the left temporal lobe, now measuring 6 x 7 mm today, favored to represent a metastasis. Recurrent linear enhancement in the mid brain on the right. Recurrent edema in the right mid brain and medial thalamus. No enhancement in the medial thalamus. These areas had normalized on the study 09/21/2020, were normal on the study of 04/09/2020 but showed striking enhancement on the study of 07/10/2020. Therefore, I wonder if these areas represent waxing and waning treatment effect. Electronically Signed   By: Nelson Chimes M.D.   On: 12/01/2020 14:40   EEG adult  Result Date: 12/01/2020 Lora Havens, MD     12/01/2020 12:13 PM Patient Name: Walter Hernandez MRN: [id number] Epilepsy Attending: Lora Havens Referring Physician/Provider: Anibal Henderson, NP Date: 12/01/2020 Duration: 22.02 mins Patient history: 69 year old male presented with acute onset aphasia and left-sided weakness.  EEG to evaluate for seizures. Level of alertness: Awake AEDs during EEG study: Keppra, Ativan Technical aspects: This EEG study was done with scalp electrodes positioned according to the 10-20 International system of electrode placement. Electrical activity was acquired at a sampling rate of 500Hz  and reviewed with a high frequency filter of 70Hz  and a low frequency filter of 1Hz . EEG data were recorded continuously and digitally  stored. Description: No posterior dominant rhythm was seen.  EEG showed continuous low amplitude 2 to 3 Hz delta slowing in left hemisphere as well as 5 to 6 Hz theta slowing in right hemisphere.  Hyperventilation and photic stimulation were not performed.   ABNORMALITY -Lateralized rhythmic delta activity, left hemisphere -Continuous slow, right hemisphere IMPRESSION: This study showed lateralized rhythmic delta activity in the left hemisphere which can be seen due to underlying structural abnormality, postictal state.  Of note,  lateralized rhythmic delta activity is also on the ictal-interictal continuum with low potential for seizures.  Additionally EEG suggestive of moderate diffuse encephalopathy, nonspecific etiology but most likely due to medications, postictal state.  No definite seizures were seen during this time. Dr. Rory Percy was notified. Lora Havens   CT HEAD CODE STROKE WO CONTRAST  Result Date: 12/01/2020 CLINICAL DATA:  Code stroke. Acute neuro deficit, stroke suspected. History of metastatic melanoma. EXAM: CT HEAD WITHOUT CONTRAST TECHNIQUE: Contiguous axial images were obtained from the base of the skull through the vertex without intravenous contrast. COMPARISON:  Head CT 11/09/2020 and MRI 09/21/2020 FINDINGS: Brain: There is no evidence of an acute large territory infarct, intracranial hemorrhage, extra-axial fluid collection or midline shift/other significant mass effect. Brain metastases demonstrated on the prior MRI are largely occult by CT. Focal cortical hypodensity/encephalomalacia in the high posterior right frontal lobe is unchanged and corresponds to the location of a previously treated lesion. Confluent hypodensities throughout the cerebral white matter bilaterally are unchanged and compatible with post radiation changes superimposed on chronic small vessel ischemia. There is moderate cerebral atrophy. Vascular: No fracture or suspicious osseous crash that calcified atherosclerosis at the skull base. No hyperdense vessel. Skull: No fracture or suspicious osseous lesion. Sinuses/Orbits: Mild mucosal thickening in the ethmoid sinuses. Clear mastoid air cells. Unremarkable orbits. Other: None. ASPECTS [facility] Stroke Program Early CT Score) - Ganglionic level infarction (caudate, lentiform nuclei, internal capsule, insula, M1-M3 cortex): 7 - Supraganglionic infarction (M4-M6 cortex): 3 Total score (0-10 with 10 being normal): 10 IMPRESSION: 1. No evidence of acute intracranial abnormality. 2. ASPECTS is 10.  3. Post radiation changes in the cerebral white matter. These results were communicated to Dr. Rory Percy at 11:07 am on 12/01/2020 by text page via the [facility] messaging system. Electronically Signed   By: Logan Bores M.D.   On: 12/01/2020 11:07   CT ANGIO HEAD NECK W WO CM (CODE STROKE)  Result Date: 12/01/2020 CLINICAL DATA:  Neurological deficit.  Acute stroke suspected. EXAM: CT ANGIOGRAPHY HEAD AND NECK TECHNIQUE: Multidetector CT imaging of the head and neck was performed using the standard protocol during bolus administration of intravenous contrast. Multiplanar CT image reconstructions and MIPs were obtained to evaluate the vascular anatomy. Carotid stenosis measurements (when applicable) are obtained utilizing NASCET criteria, using the distal internal carotid diameter as the denominator. CONTRAST:  57mL OMNIPAQUE IOHEXOL 350 MG/ML SOLN COMPARISON:  Head CT earlier same day. FINDINGS: CTA NECK FINDINGS Aortic arch: Aortic atherosclerosis. Branching pattern is normal without origin stenosis. Right carotid system: Common carotid artery widely patent to the bifurcation. Calcified plaque at the carotid bifurcation and ICA bulb but no stenosis compared to the more distal cervical ICA diameter. Left carotid system: Common carotid artery widely patent to the bifurcation. Calcified plaque at the carotid bifurcation and ICA bulb. No stenosis compared to the diameter of the more distal cervical ICA. Vertebral arteries: Left vertebral artery origin is normal. Right vertebral artery origin shows some adjacent calcification but no stenosis. Both vertebral  arteries widely patent through the cervical region to the foramen magnum. Skeleton: Ordinary cervical spondylosis. Other neck: No mass or lymphadenopathy. Upper chest: Mild emphysema and pulmonary scarring. Review of the MIP images confirms the above findings CTA HEAD FINDINGS Anterior circulation: Both internal carotid arteries patent through the skull base and siphon  regions. Minimal atherosclerotic calcification in the carotid siphon regions but no stenosis. The anterior and middle cerebral vessels are patent. No acute large vessel occlusion or correctable proximal stenosis. Posterior circulation: Both vertebral arteries widely patent through the foramen magnum to the basilar. No basilar stenosis. Posterior circulation branch vessels appear normal. Venous sinuses: Patent and normal. Anatomic variants: None significant. Review of the MIP images confirms the above findings IMPRESSION: No acute intracranial large or medium vessel occlusion. No correctable proximal stenosis. Atherosclerotic calcification at both carotid bifurcations but without stenosis. These results were communicated to Dr. Rory Percy at [place] 6/28/2022by text page via the [facility] messaging system. Electronically Signed   By: Nelson Chimes M.D.   On: 12/01/2020 11:11    Procedures .Critical Care  Date/Time: 12/01/2020 3:42 PM Performed by: Sherwood Gambler, MD Authorized by: Sherwood Gambler, MD   Critical care provider statement:    Critical care time (minutes):  35   Critical care time was exclusive of:  Separately billable procedures and treating other patients   Critical care was necessary to treat or prevent imminent or life-threatening deterioration of the following conditions:  CNS failure or compromise   Critical care was time spent personally by me on the following activities:  Discussions with consultants, evaluation of patient's response to treatment, examination of patient, ordering and performing treatments and interventions, ordering and review of laboratory studies, ordering and review of radiographic studies, pulse oximetry, re-evaluation of patient's condition, obtaining history from patient or surrogate and review of old charts   Medications Ordered in ED Medications  sodium chloride flush (NS) 0.9 % injection 3 mL (has no administration in time range)  LORazepam (ATIVAN) injection 1  mg (has no administration in time range)  lacosamide (VIMPAT) 200 mg in sodium chloride 0.9 % 25 mL IVPB (has no administration in time range)  phenytoin (DILANTIN) injection 100 mg (has no administration in time range)  levETIRAcetam (KEPPRA) IVPB 1000 mg/100 mL premix (has no administration in time range)  iohexol (OMNIPAQUE) 350 MG/ML injection 75 mL (75 mLs Intravenous Contrast Given 12/01/20 1100)  dexamethasone (DECADRON) injection 10 mg (10 mg Intravenous Given 12/01/20 1120)  levETIRAcetam (KEPPRA) IVPB 1000 mg/100 mL premix (0 mg Intravenous Stopped 12/01/20 1202)    And  levETIRAcetam (KEPPRA) IVPB 1000 mg/100 mL premix (0 mg Intravenous Stopped 12/01/20 1140)  LORazepam (ATIVAN) 2 MG/ML injection (1 mg  Given 12/01/20 1122)  fosPHENYtoin (CEREBYX) 1,340 mg PE in sodium chloride 0.9 % 50 mL IVPB (0 mg PE/kg  67 kg Intravenous Stopped 12/01/20 1436)  LORazepam (ATIVAN) injection 1 mg (1 mg Intravenous Given 12/01/20 1231)  gadobutrol (GADAVIST) 1 MMOL/ML injection 6 mL (6 mLs Intravenous Contrast Given 12/01/20 1312)    ED Course  I have reviewed the triage vital signs and the nursing notes.  Pertinent labs & imaging results that were available during my care of the patient were reviewed by me and considered in my medical decision making (see chart for details).    MDM Rules/Calculators/A&P                          Patient does not appear to  have an acute stroke but rather is having seizures.  Meets criteria for status epilepticus.  Has been given multiple different antiepileptics including a couple doses of benzodiazepines, fosphenytoin, and Keppra.  It seems on the EEG that he is not currently seizing though he is postictal.  At this point he does appear to be protecting his airway but he is probably going to be getting more seizure meds as neurology is following along closely.  Thus it is recommended he be admitted to the ICU for close airway management as he is high risk to get  intubated.  Discussed with ICU for admission. Final Clinical Impression(s) / ED Diagnoses Final diagnoses:  Status epilepticus [facility])    Rx / DC Orders ED Discharge Orders     None        Sherwood Gambler, MD 12/01/20 1542

## 2020-12-02 ENCOUNTER — Inpatient Hospital Stay (HOSPITAL_COMMUNITY): Payer: Medicare Other

## 2020-12-02 DIAGNOSIS — Z7189 Other specified counseling: Secondary | ICD-10-CM

## 2020-12-02 DIAGNOSIS — Z515 Encounter for palliative care: Secondary | ICD-10-CM

## 2020-12-02 DIAGNOSIS — R569 Unspecified convulsions: Secondary | ICD-10-CM | POA: Diagnosis not present

## 2020-12-02 DIAGNOSIS — C3490 Malignant neoplasm of unspecified part of unspecified bronchus or lung: Secondary | ICD-10-CM | POA: Diagnosis not present

## 2020-12-02 LAB — BASIC METABOLIC PANEL
Anion gap: 8 (ref 5–15)
BUN: 13 mg/dL (ref 8–23)
CO2: 21 mmol/L — ABNORMAL LOW (ref 22–32)
Calcium: 8.4 mg/dL — ABNORMAL LOW (ref 8.9–10.3)
Chloride: 104 mmol/L (ref 98–111)
Creatinine, Ser: 1.79 mg/dL — ABNORMAL HIGH (ref 0.61–1.24)
GFR, Estimated: 40 mL/min — ABNORMAL LOW (ref >60)
Glucose, Bld: 144 mg/dL — ABNORMAL HIGH (ref 70–99)
Potassium: 4.9 mmol/L (ref 3.5–5.1)
Sodium: 133 mmol/L — ABNORMAL LOW (ref 135–145)

## 2020-12-02 LAB — CBC
HCT: 30.7 % — ABNORMAL LOW (ref 39.0–52.0)
Hemoglobin: 10.6 g/dL — ABNORMAL LOW (ref 13.0–17.0)
MCH: 32.7 pg (ref 26.0–34.0)
MCHC: 34.5 g/dL (ref 30.0–36.0)
MCV: 94.8 fL (ref 80.0–100.0)
Platelets: 103 10*3/uL — ABNORMAL LOW (ref 150–400)
RBC: 3.24 MIL/uL — ABNORMAL LOW (ref 4.22–5.81)
RDW: 18.2 % — ABNORMAL HIGH (ref 11.5–15.5)
WBC: 7.6 10*3/uL (ref 4.0–10.5)
nRBC: 0.3 % — ABNORMAL HIGH (ref 0.0–0.2)

## 2020-12-02 LAB — VITAMIN D 25 HYDROXY (VIT D DEFICIENCY, FRACTURES): Vit D, 25-Hydroxy: 34.24 ng/mL (ref 30–100)

## 2020-12-02 LAB — PHOSPHORUS
Phosphorus: 4.9 mg/dL — ABNORMAL HIGH (ref 2.5–4.6)
Phosphorus: 3.6 mg/dL (ref 2.5–4.6)

## 2020-12-02 LAB — GLUCOSE, CAPILLARY
Glucose-Capillary: 128 mg/dL — ABNORMAL HIGH (ref 70–99)
Glucose-Capillary: 129 mg/dL — ABNORMAL HIGH (ref 70–99)
Glucose-Capillary: 131 mg/dL — ABNORMAL HIGH (ref 70–99)

## 2020-12-02 LAB — SODIUM, URINE, RANDOM: Sodium, Ur: 64 mmol/L

## 2020-12-02 LAB — MAGNESIUM
Magnesium: 1 mg/dL — ABNORMAL LOW (ref 1.7–2.4)
Magnesium: 2.2 mg/dL (ref 1.7–2.4)

## 2020-12-02 LAB — OSMOLALITY: Osmolality: 285 mOsm/kg (ref 275–295)

## 2020-12-02 LAB — AMMONIA: Ammonia: 18 umol/L (ref 9–35)

## 2020-12-02 LAB — OSMOLALITY, URINE: Osmolality, Ur: 407 mOsm/kg (ref 300–900)

## 2020-12-02 LAB — TSH: TSH: 1.419 u[IU]/mL (ref 0.350–4.500)

## 2020-12-02 IMAGING — DX DG ABD PORTABLE 1V
1 series · 1 of 1 positions shown · non-contrast
Comparison: None.

CLINICAL DATA: Check feeding catheter placement

EXAM:
PORTABLE ABDOMEN - 1 VIEW

[abdomen]
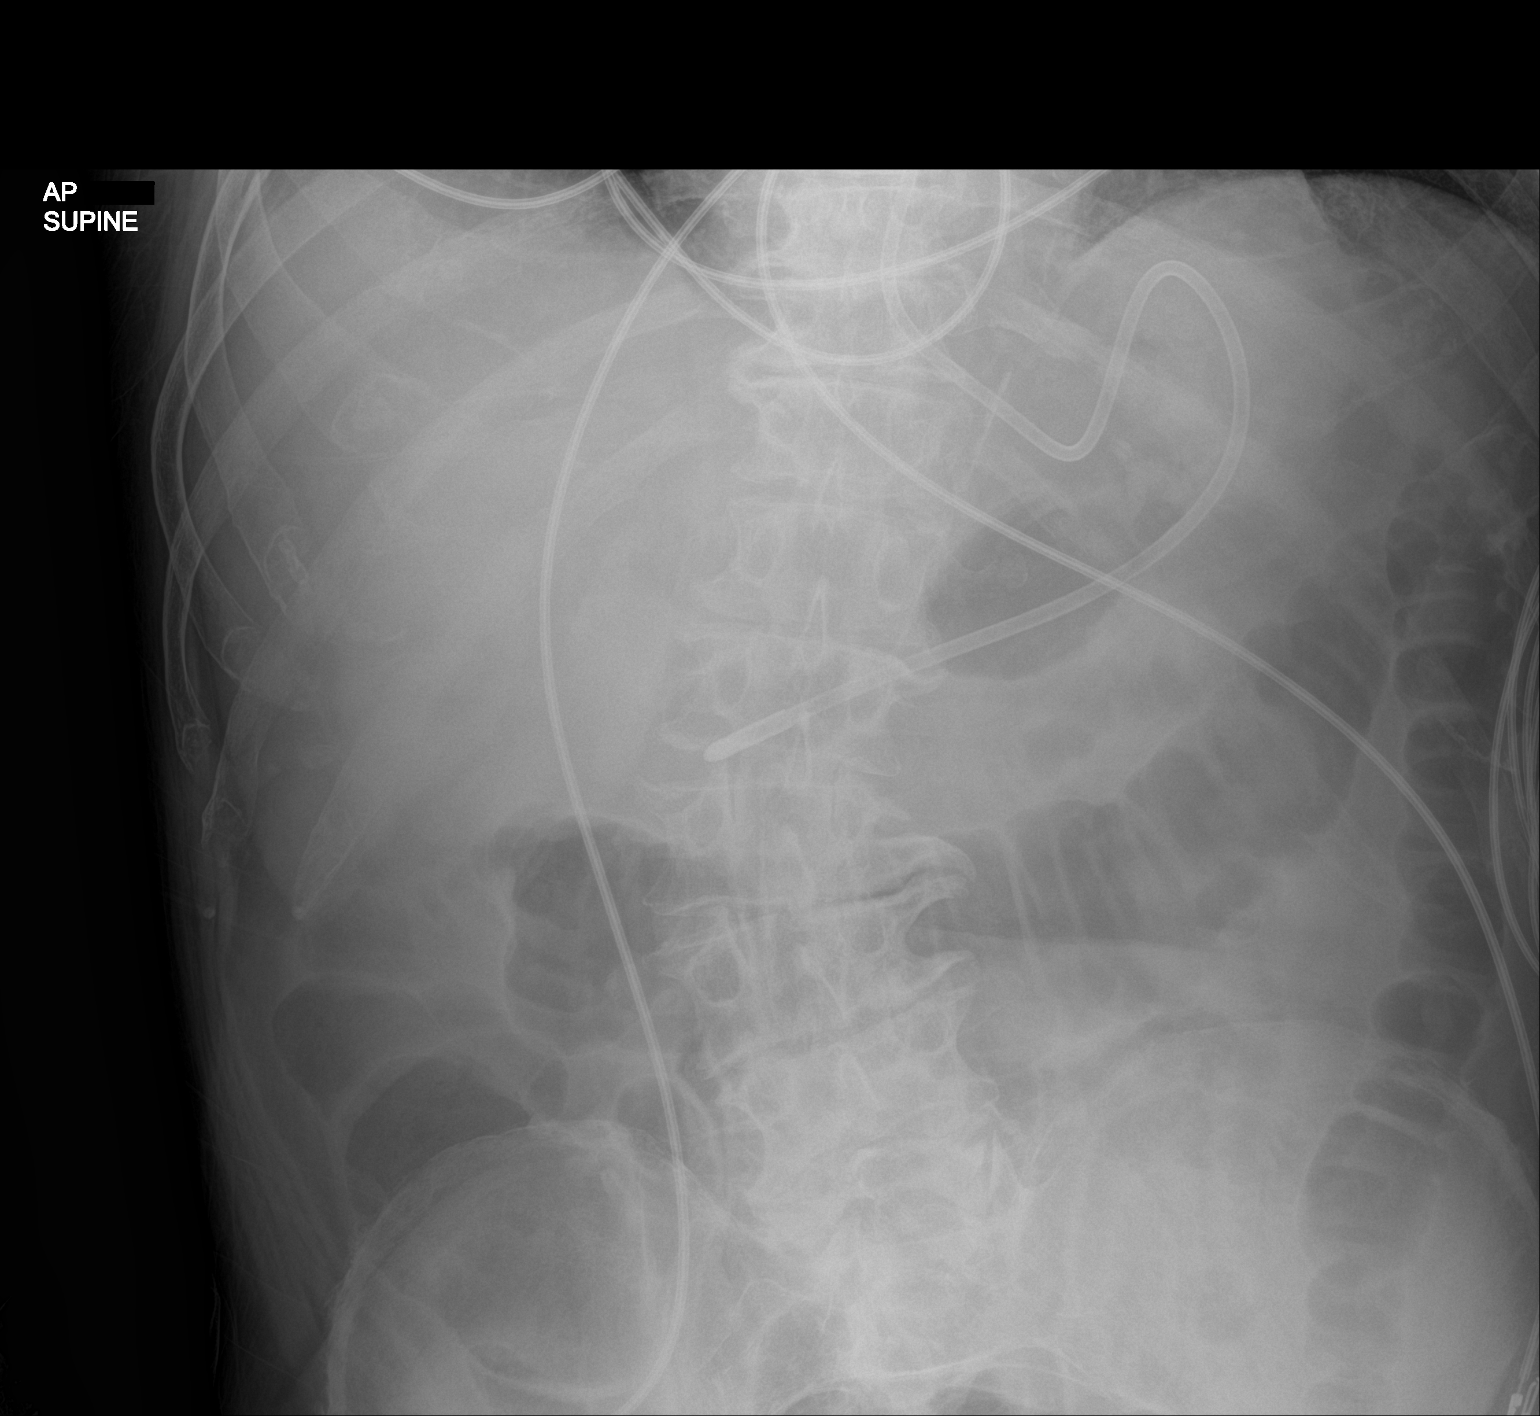

[1 of 1 positions shown; findings below may reference images not displayed]

FINDINGS: Feeding catheter is noted in the distal stomach directed towards the
pylorus. Scattered large and small bowel gas is noted. No other
focal abnormality is seen.
IMPRESSION: Feeding catheter in the distal stomach.

## 2020-12-02 MED ORDER — ADULT MULTIVITAMIN W/MINERALS CH
1.0000 | ORAL_TABLET | Freq: Every day | ORAL | Status: DC
  Administered 2020-12-02 – 2020-12-07 (×6): 1
  Filled 2020-12-02 (×6): qty 1

## 2020-12-02 MED ORDER — MAGNESIUM SULFATE 2 GM/50ML IV SOLN
2.0000 g | Freq: Once | INTRAVENOUS | Status: AC
  Administered 2020-12-02: 2 g via INTRAVENOUS
  Filled 2020-12-02: qty 50

## 2020-12-02 MED ORDER — ORAL CARE MOUTH RINSE
15.0000 mL | Freq: Two times a day (BID) | OROMUCOSAL | Status: DC
  Administered 2020-12-02 – 2020-12-15 (×21): 15 mL via OROMUCOSAL

## 2020-12-02 MED ORDER — CHLORHEXIDINE GLUCONATE 0.12 % MT SOLN
15.0000 mL | Freq: Two times a day (BID) | OROMUCOSAL | Status: DC
  Administered 2020-12-02 – 2020-12-17 (×26): 15 mL via OROMUCOSAL
  Filled 2020-12-02 (×22): qty 15

## 2020-12-02 MED ORDER — OSMOLITE 1.5 CAL PO LIQD
1000.0000 mL | ORAL | Status: DC
  Administered 2020-12-02 – 2020-12-09 (×5): 1000 mL
  Filled 2020-12-02 (×4): qty 1000

## 2020-12-02 MED ORDER — MUPIROCIN 2 % EX OINT
1.0000 "application " | TOPICAL_OINTMENT | Freq: Two times a day (BID) | CUTANEOUS | Status: AC
  Administered 2020-12-02 – 2020-12-06 (×10): 1 via NASAL
  Filled 2020-12-02: qty 22

## 2020-12-02 MED ORDER — PROSOURCE TF PO LIQD
45.0000 mL | Freq: Three times a day (TID) | ORAL | Status: DC
  Administered 2020-12-02 – 2020-12-07 (×15): 45 mL
  Filled 2020-12-02 (×16): qty 45

## 2020-12-02 NOTE — Progress Notes (Addendum)
Neurology Progress Note   S:// Seen and examined. Wife at bedside Patient intubated for airway protection yesterday. On minimal Precedex-no seizure activity noted overnight    O:// Current vital signs: BP 104/71 (BP Location: Right Arm)   Pulse (!) 58   Temp 97.8 F (36.6 C) (Axillary)   Resp 18   Ht $R'5\' 7"'hw$  (1.702 m)   Wt 66.8 kg   SpO2 100%   BMI 23.07 kg/m  Vital signs in last 24 hours: Temp:  [97.8 F (36.6 C)-98.8 F (37.1 C)] 97.8 F (36.6 C) (06/29 0800) Pulse Rate:  [33-131] 58 (06/29 0800) Resp:  [11-31] 18 (06/29 0800) BP: (85-192)/(53-118) 104/71 (06/29 0800) SpO2:  [91 %-100 %] 100 % (06/29 0800) FiO2 (%):  [40 %-50 %] 40 % (06/29 0749) Weight:  [66.8 kg-67 kg] 66.8 kg (06/29 0500) General: Intubated, on minimal sedation with Precedex HEENT: Normocephalic atraumatic CVS: Regular rate rhythm Respiratory: Breathing with the ventilator but upon stimulation with over the ventilator Extremities warm well perfused Neurological exam Minimal sedation with Precedex, intubated. Does not open eyes to voice completely would question that he was attempting to open his eyes. Spontaneously moving all fours Cranial nerves: Pupils equal round reactive to light, there is no gaze preference or deviation but he has nystagmoid movement of both his eyes open and close-not the typical roving eye movements seen with encephalopathy.  Facial symmetry difficult to ascertain.  Breathing over the ventilator on stimulation.  Corneals present bilaterally. Motor examination: Spontaneously moving all fours and to noxious stimulation very briskly withdraws in all fours. Sensory: As above  Medications  Current Facility-Administered Medications:    chlorhexidine (PERIDEX) 0.12 % solution 15 mL, 15 mL, Mouth Rinse, BID, Chand, Sudham, MD   chlorhexidine gluconate (MEDLINE KIT) (PERIDEX) 0.12 % solution 15 mL, 15 mL, Mouth Rinse, BID, Chand, Sudham, MD, 15 mL at 12/02/20 0742   Chlorhexidine  Gluconate Cloth 2 % PADS 6 each, 6 each, Topical, Daily, Chand, Sudham, MD, 6 each at 12/01/20 1700   docusate sodium (COLACE) capsule 100 mg, 100 mg, Oral, BID PRN, Estill Cotta, NP   levETIRAcetam (KEPPRA) IVPB 1000 mg/100 mL premix, 1,000 mg, Intravenous, Q12H, Toberman, Stevi W, NP, Stopped at 12/01/20 2139   magnesium sulfate IVPB 2 g 50 mL, 2 g, Intravenous, Once, Estill Cotta, NP, Last Rate: 50 mL/hr at 12/02/20 0740, 2 g at 12/02/20 0740   magnesium sulfate IVPB 2 g 50 mL, 2 g, Intravenous, Once, Chand, Currie Paris, MD   MEDLINE mouth rinse, 15 mL, Mouth Rinse, 10 times per day, Jacky Kindle, MD, 15 mL at 12/02/20 0531   pantoprazole (PROTONIX) injection 40 mg, 40 mg, Intravenous, QHS, Estill Cotta, NP, 40 mg at 12/01/20 2119   phenytoin (DILANTIN) injection 100 mg, 100 mg, Intravenous, Q8H, Toberman, Stevi W, NP, 100 mg at 12/02/20 0529   polyethylene glycol (MIRALAX / GLYCOLAX) packet 17 g, 17 g, Oral, Daily PRN, Estill Cotta, NP   sodium chloride flush (NS) 0.9 % injection 10-40 mL, 10-40 mL, Intracatheter, Q12H, Chand, Sudham, MD, 10 mL at 12/01/20 2126   sodium chloride flush (NS) 0.9 % injection 10-40 mL, 10-40 mL, Intracatheter, PRN, Jacky Kindle, MD  Facility-Administered Medications Ordered in Other Encounters:    sodium chloride flush (NS) 0.9 % injection 10 mL, 10 mL, Intravenous, PRN, Volanda Napoleon, MD, 10 mL at 10/21/19 0912 Labs CBC    Component Value Date/Time   WBC 6.2 12/01/2020 1050   RBC 3.43 (L)  12/01/2020 1050   HGB 11.9 (L) 12/01/2020 1757   HGB 8.5 (L) 11/24/2020 0830   HGB 16.1 01/30/2017 0804   HCT 35.0 (L) 12/01/2020 1757   HCT 44.9 01/30/2017 0804   PLT 104 (L) 12/01/2020 1050   PLT 97 (L) 11/24/2020 0830   PLT 186 01/30/2017 0804   MCV 96.2 12/01/2020 1050   MCV 101 (H) 01/30/2017 0804   MCH 33.5 12/01/2020 1050   MCHC 34.8 12/01/2020 1050   RDW 17.8 (H) 12/01/2020 1050   RDW 11.8 01/30/2017 0804   LYMPHSABS 2.1 12/01/2020 1050    LYMPHSABS 1.8 01/30/2017 0804   MONOABS 1.0 12/01/2020 1050   EOSABS 0.1 12/01/2020 1050   EOSABS 0.1 01/30/2017 0804   BASOSABS 0.0 12/01/2020 1050   BASOSABS 0.0 01/30/2017 0804    CMP     Component Value Date/Time   NA 133 (L) 12/02/2020 0534   NA 142 01/30/2017 0804   K 4.9 12/02/2020 0534   K 4.1 01/30/2017 0804   CL 104 12/02/2020 0534   CL 105 01/25/2016 0926   CO2 21 (L) 12/02/2020 0534   CO2 29 01/30/2017 0804   GLUCOSE 144 (H) 12/02/2020 0534   GLUCOSE 105 01/30/2017 0804   GLUCOSE 113 01/25/2016 0926   BUN 13 12/02/2020 0534   BUN 6.9 (L) 01/30/2017 0804   CREATININE 1.79 (H) 12/02/2020 0534   CREATININE 1.53 (H) 11/24/2020 0830   CREATININE 0.8 01/30/2017 0804   CALCIUM 8.4 (L) 12/02/2020 0534   CALCIUM 10.5 (H) 01/30/2017 0804   PROT 5.8 (L) 12/01/2020 1050   PROT 7.9 01/30/2017 0804   ALBUMIN 3.4 (L) 12/01/2020 1050   ALBUMIN 4.5 01/30/2017 0804   AST 19 12/01/2020 1050   AST 13 (L) 11/24/2020 0830   AST 34 01/30/2017 0804   ALT 11 12/01/2020 1050   ALT 7 11/24/2020 0830   ALT 18 01/30/2017 0804   ALKPHOS 77 12/01/2020 1050   ALKPHOS 79 01/30/2017 0804   BILITOT 0.6 12/01/2020 1050   BILITOT 0.4 11/24/2020 0830   BILITOT 1.49 (H) 01/30/2017 0804   GFRNONAA 40 (L) 12/02/2020 0534   GFRNONAA 48 (L) 11/24/2020 0830   GFRAA >60 03/02/2020 1135    Imaging I have reviewed images in epic and the results pertinent to this consultation are: MRI reviewed-please see note from yesterday.  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 Imaging negative for stroke but revealed new metastases-1 in the brainstem and 1 in the left temporal lobe with resolution of the prior seen metastases. Examination remains poor with left gaze, right arm weakness and aphasia and  was not able to protect his airway for which she is intubated. EEG yesterday was concerning for left hemispheric dysfunction-lateralized rhythmic delta activity in the left hemisphere which can be seen due to underlying structural abnormality, postictal state but is also on the ictal interictal continuum. Clinically-concern for ongoing status epilepticus on presentation or prolonged postictal state after seizure-seems to have been resolved. Started on IV Decadron, loaded with additional Keppra-Keppra as well as home antiepileptic as well.  Given additional load of fosphenytoin followed by Dilantin.  Also loaded with Vimpat followed by standing doses of Vimpat. Today's exam-does not seem to be clinically status Overnight EEG read pending.  Recommendations: Follow overnight EEG reading If negative for seizures, reduce  sedation and attempt to extubate when able to. Seizure precautions Continue Keppra, and Dilantin.  Will decide on Vimpat based on the EEG read. Check Dilantin level per pharmacy. Correct toxic metabolic derangements per primary team Check ammonia level Had a detailed discussion with wife regarding his current condition, which remains critical but stable.  Again reiterated that multiple metastases, advanced cancer and other comorbidities usually have poor prognosis but she believes that he has pulled through in the past from a lot of setbacks and will do just fine. Palliative care team involvement would be a good idea but she has been reluctant with palliative medicine involvement in the past.  Discussed my plan with PCCM attending Dr. Tacy Learn and Hillery Aldo, NP.   Addendum Overnight EEG with no seizures LTM can be discontinued No need for Vimpat-continue Keppra and Dilantin.  -- Amie Portland, MD Neurologist [facility] Pager: [phone]  CRITICAL CARE ATTESTATION Performed by: Amie Portland, MD Total critical care time:35 minutes Critical care time was  exclusive of separately billable procedures and treating other patients and/or supervising APPs/Residents/Students Critical care was necessary to treat or prevent imminent or life-threatening deterioration due to brain metastases from small cell lung cancer, possible status epilepticus. This patient is critically ill and at significant risk for neurological worsening and/or death and care requires constant monitoring. Critical care was time spent personally by me on the following activities: development of treatment plan with patient and/or surrogate as well as nursing, discussions with consultants, evaluation of patient's response to treatment, examination of patient, obtaining history from patient or surrogate, ordering and performing treatments and interventions, ordering and review of laboratory studies, ordering and review of radiographic studies, pulse oximetry, re-evaluation of patient's condition, participation in multidisciplinary rounds and medical decision making of high complexity in the care of this patient.

## 2020-12-02 NOTE — Progress Notes (Addendum)
NAME:  Walter Hernandez, MRN:  [id number], DOB:  [birth year], LOS: 1 ADMISSION DATE:  12/01/2020, CONSULTATION DATE:  6/28 REFERRING Hernandez:  Walter Hernandez, CHIEF COMPLAINT:  Seizure   History of Present Illness:  Patient is encephalopathic. Therefore history has been obtained from chart review and from wife at bedside.  Walter Hernandez is an 70 y.o. who presented to the [facility] ED via EMS with concern for seizure  Walter  Walter Hernandez lives at home with his wife. 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  Per his wife, Walter. Hernandez is a patient of Walter Hernandez and he was diagnosed with small cell lung cancer with mets to the brain and liver in December of 2020 and started chemotherapy in February of 2021. An MRI in February of 2022 showed worsening brain mets. He underwent salvage SRS on 2/16. He was admitted for seizures on 09/05/20. MRI in march of 2022 showed worsening brain mets again. He underwent SRS a second time on 10/05/2020. In his last visit with Walter Hernandez on 11/11/2020 chemotherapy was stopped due to the patient having some difficulty with the thearpy.  On 6/28 the patient had woken up at 7AM, expressed some fatigue, and desire to take a nap around 10 AM. A short time later he was confused, not following commands, and aphasic. EMS was called.  Presentation at [facility] was notable for left gaze preference, aphasia, left side weakness. A code stroke ws called and Neurology was consulted. NIHSS score was 20. CT head was negative for acute abnormality. MRI was negative for acute infarct. IV decadron was given due to history of brain mets. Presentation was then felt to be from seizure with post-ictal state. 1 MG ativan was given. EEG showed lateralized rhythmic delta activity in the left hemisphere. There was no clinical improvement post ativan so another 2mg  ativan along with 200mg  Vimpat, 2000mg  keppra, and 20mg /kg fosphenytoin were given per documentation  in the ED.   PCCM was consulted for admission and consideration for intubation. He remains critically ill on the 4N ICU.  Pertinent  Medical History  Active small cell lung cancer with mets to brain and liver, HTN, LLE malignant melanoma  Significant Hospital Events: Including procedures, antibiotic start and stop dates in addition to other pertinent events   6/28 Presented to [facility] with concern for seizure. CT head no acute abnormalities. MRI no infarction. EEG> ,Intubated  Interim History / Subjective:  Admit  Intubated around 1800 for hypoxic resp failure  Tmax 99.8  683ml uop, +921ml admit  Intubated/sedated  Precedex 0.4  No ativan administration overnight  Unable to obtain subjective evaluation due to patient status  Objective   Blood pressure 102/64, pulse 62, temperature 98.3 F (36.8 C), temperature source Axillary, resp. rate 18, height 5\' 7"  (1.702 m), weight 66.8 kg, SpO2 99 %.    Vent Mode: PRVC FiO2 (%):  [40 %-50 %] 40 % Set Rate:  [20 bmp] 20 bmp Vt Set:  [460 mL] 460 mL PEEP:  [5 cmH20] 5 cmH20 Plateau Pressure:  [11 cmH20-12 cmH20] 12 cmH20   Intake/Output Summary (Last 24 hours) at 12/02/2020 0724 Last data filed at 12/02/2020 0600 Gross per 24 hour  Intake 1788.4 ml  Output 1125 ml  Net 663.4 ml   Filed Weights   12/01/20 1131 12/01/20 1700 12/02/20 0500  Weight: 67 kg 66.8 kg 66.8 kg    Examination: General: in bed, appears  comfortable, NAD, frail HEENT: MM pink, dry, anicteric, trachea midline  Neuro: GCS 6T, withdraws in all four, no eye opening, RASS -3, PERRL 19mm, +C/C/G, - dolls eye CV: S1S2, SB on monitor, no m/r/g appreciated PULM:  clear in the upper lobes and in the lower lobes, clear thinsecretions, chest expansion symmetric GI: soft, bsx4 active, non distended  Extremities: warm/dry, no pretibial edema, capillary refill less than 3 seconds  Skin: no rashes or lesions   Resolved Hospital Problem list     Assessment & Plan:   Seizure VS Worsening of brain metastasis from Small Cell Lung Cancer Acute Encephalopathy Hx Seizure Seizures on 09/05/2020. On Keppra 500mg  BID at home. 3mg  ativan, 200mg  Vimpat, 2000mg  keppra, and 20mg /kg fosphenytoin given per documentation in the ED. MRI does not demonstrate any infarction. ?medication vs post-ictal vs worsening brain mets. -Appreciate neurology assistance. Workup per neurology -Continue EEG -AED's per neuro: On Keppra 1000mg  BID, Dilantin 100mg  q8h,  -PAD bundle with precedex gtt and fentanyl IV push. Goal RASS 0 to -1. Current RASS -3. Wean precedex to goal. -If seizures lasting longer than 5 minutes will give 2mg  ativan and notify neurology -Continue [place] conversation with wife today. -Will order for cortrak placement today for enteral access. -PT/OT/SLP eval depending on patient progression.  Acute Respiratory Failure with Hypoxia Suspect secondary to seizure -LTVV strategy with tidal volumes of 4-8 cc/kg ideal body weight -Goal plateau pressures of 30 and driving pressures of 15 -Wean PEEP/FiO2 for SpO2 greater than 92-98% -Follow intermittent CXR and ABG PRN -SAT/SBT today. Hope to extubate if tolerated. -Continue VAP precautions  Hyponatremia, Hypomagnesia  ?SIADH secondary to brain mets. NA 134>133. Appears to be chronically hyponatremic per chart review. MG 1.0 -Check urine sodium, urine and serum osm, TSH, AM cortisol -Replete MAG -Monitor on AM BMP -No free water -Keep NPO  AKI on CKD H/o BPH Was on flomax Creat 1.79, 1 month ago 1.3 -Monitor I&O closely -May need foley if having issues with voiding -PRN bladder scan -Ensure renal perfusion. Goal MAP 65 or greater. -Avoid neprotoxic drugs as possible. -Strict I&O's -Follow up AM creatinine  Normocytic Anemia- suspect chronic secondary to chemo Thrombocytopenia Hgb 11.5> , PLT 104> -Follow up AM CBC 6/29 -Transfuse PRBC if HBG less than 7 -Obtain AM CBC to trend H&H  Hx of Active Small  Cell Lung Cancer with Metasis to Brain and Liver, also remote h/o melanoma.  Had out-pt palliative consultation ordered but not completed as had not been tolerating systemic therapy  Followed by Walter Hernandez. Systemic chemothearpy held on 11/11/2020 -Follow up [facility] consult -Neurology reached out to Neuro-onc per charting. Follow up.  Best Practice (right click and "Reselect all SmartList Selections" daily)   Diet/type: NPO DVT prophylaxis: SCD GI prophylaxis: PPI Lines: N/A Port accessed Foley:  N/A Code Status:  full code Last date of multidisciplinary goals of care discussion [6/28 in ED with Wife, ongoing [place] conversation]   Critical care time: 2 Minutes   Redmond School., MSN, APRN, AGACNP-BC [facility]  12/02/2020 , 7:24 AM  Please see [email or web address] for pager details  If no response, please call [phone] After hours, please call Elink at [phone]

## 2020-12-02 NOTE — Procedures (Signed)
Extubation Procedure Note  Patient Details:   Name: Walter Hernandez DOB: 29-Nov-1950 MRN: [id number]   Airway Documentation:   Patient extubated per orders. Patient had positive cuff leak prior to extubation. Pt has strong cough but will not speak at this time. Placed on 4lpm nasal cannula. Will continue to monitor. Vent end date: 12/02/20 Vent end time: 1344   Evaluation  O2 sats: stable throughout Complications: No apparent complications Patient did tolerate procedure well. Bilateral Breath Sounds: Diminished   No  Ander Purpura 12/02/2020, 1:45 PM

## 2020-12-02 NOTE — Procedures (Signed)
Cortrak  Person Inserting Tube:  Esaw Dace, RD Tube Type:  Cortrak - 43 inches Tube Size:  10 Tube Location:  Right nare Initial Placement:  Stomach Secured by: Bridle Technique Used to Measure Tube Placement:  Marking at nare/corner of mouth Cortrak Secured At:  81 cm  Cortrak Tube Team Note:  Consult received to place a Cortrak feeding tube.   Difficulty with insertion given large hiatal hernia; tube tip appears to be at pylorus based on Cortrak tracing but may be coiled in hernia.  X-ray is required, abdominal x-ray has been ordered by the Cortrak team. Please confirm tube placement before using the Cortrak tube.   If the tube becomes dislodged please keep the tube and contact the Cortrak team at [email or web address] (password [name]) for replacement.  If after hours and replacement cannot be delayed, place a NG tube and confirm placement with an abdominal x-ray.   Kerman Passey MS, RDN, LDN, CNSC Registered Dietitian III Clinical Nutrition RD Pager and On-Call Pager Number Located in [place]

## 2020-12-02 NOTE — Progress Notes (Signed)
LTM EEG discontinued - no skin breakdown at unhook.   

## 2020-12-02 NOTE — Progress Notes (Signed)
Maint complete - no skin breakdown under fp1 ,fp2, f7, f8

## 2020-12-02 NOTE — Progress Notes (Signed)
Initial Nutrition Assessment  DOCUMENTATION CODES:   Not applicable  INTERVENTION:   Initiate tube feeding via Cortrak tube: Osmolite 1.5 at 25 ml/h and increase by 10 ml every 8 hours to goal rate of 55 ml/h (1320 ml per day) Prosource TF 45 ml TID  Provides 2100 kcal, 115 gm protein, 1003 ml free water daily  MVI with minerals daily via tube  Monitor magnesium and phosphorus every 12 hours x 4 occurances, MD to replete as needed, as pt is at risk for refeeding syndrome given history of cancer/cancer treatments.    NUTRITION DIAGNOSIS:   Increased nutrient needs related to cancer and cancer related treatments as evidenced by estimated needs.  GOAL:   Patient will meet greater than or equal to 90% of their needs  MONITOR:   TF tolerance, Labs  REASON FOR ASSESSMENT:   Ventilator, Consult Enteral/tube feeding initiation and management  ASSESSMENT:   Pt with PMH of active small cell lung cancer with mets to the brain and liver dx 05/2019, started chemo 07/2019, s/p salvage SRS on 07/23/2019, seizures 09/2019 s/p SRS 10/05/20, HTN, and LLE malignant melanoma. Pt recently stopped chemo 11/11/20 due to difficulty with therapy now admitted with seizures.    Pt discussed during ICU rounds and with RN. Spoke with MD ok to start TF.  Palliative consulted and in with pt and family currently  6/29 extubated; s/p cortrak placement (tip gastric)  Medications reviewed and include: protonix Keppra Mag sulfate x 1  Labs reviewed: Na 133, PO4: 4.9, magnesium: 1.0      Diet Order:   Diet Order             Diet NPO time specified  Diet effective now                   EDUCATION NEEDS:   Not appropriate for education at this time  Skin:  Skin Assessment: Reviewed RN Assessment (skin tear L elbow; MASD perineum)  Last BM:  unknown  Height:   Ht Readings from Last 1 Encounters:  12/01/20 5\' 7"  (1.702 m)    Weight:   Wt Readings from Last 1 Encounters:  12/02/20  66.8 kg    Ideal Body Weight:  (P) 67.2 kg  BMI:  Body mass index is 23.07 kg/m.  Estimated Nutritional Needs:   Kcal:  2000-2300  Protein:  115-125 grams  Fluid:  >2 L/day  Lockie Pares., RD, LDN, CNSC See AMiON for contact information

## 2020-12-02 NOTE — Procedures (Addendum)
Patient Name: Walter Hernandez  MRN: [id number]  Epilepsy Attending: Lora Havens  Referring Physician/Provider: Dr Amie Portland Duration: 12/01/2020 1214 to 12/02/2020 1403   Patient history: 70 year old male presented with acute onset aphasia and left-sided weakness.  EEG to evaluate for seizures.   Level of alertness: Awake, asleep   AEDs during EEG study: Keppra, PHT   Technical aspects: This EEG study was done with scalp electrodes positioned according to the 10-20 International system of electrode placement. Electrical activity was acquired at a sampling rate of 500Hz  and reviewed with a high frequency filter of 70Hz  and a low frequency filter of 1Hz . EEG data were recorded continuously and digitally stored.   Description: No posterior dominant rhythm was seen.  Sleep was characterized by sleep spindles (12 to 14 Hz), maximal frontocentral region.  EEG showed continuous generalized 5 to 6 Hz theta slowing. Intermittent 2 to 3 Hz rhythmic delta slowing was also noted in left hemisphere which at times appeared sharply contoured. Hyperventilation and photic stimulation were not performed.      ABNORMALITY -Lateralized rhythmic delta activity, left hemisphere -Continuous slow, generalized   IMPRESSION: This study showed lateralized rhythmic delta activity in the left hemisphere which can be seen due to underlying structural abnormality, postictal state.  Of note, lateralized rhythmic delta activity is also on the ictal-interictal continuum with low potential for seizures. Additionally EEG suggestive of moderate diffuse encephalopathy, nonspecific etiology but most likely due to medications, postictal state.  No definite seizures were seen during this time.   Kenadi Miltner Barbra Sarks

## 2020-12-02 NOTE — Consult Note (Signed)
Consultation Note Date: 12/02/2020   Patient Name: Walter Hernandez  DOB: 12/14/1950  MRN: [id number]  Age / Sex: 70 y.o., male  PCP: Lavone Orn, MD Referring Physician: Jacky Kindle, MD  Reason for Consultation: Establishing goals of care  HPI/Patient Profile: 70 y.o. 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 Patient was diagnosed with small cell lung cancer with mets to the brain and liver in December of 2020 and started chemotherapy in February of 2021. An MRI in February of 2022 showed worsening brain mets. He underwent salvage SRS on 2/16. He was admitted for seizures on 09/05/20. MRI in march of 2022 showed worsening brain mets again. He underwent SRS a second time on 10/05/2020. In his last visit with Dr. Marin Olp on 11/11/2020 chemotherapy was stopped due to the patient having some difficulty with the thearpy. This admission, CT head was negative for acute abnormality and MRI was negative for acute infarct. Multiple medications given for seizure activity. Patient experienced acute respiratory failure and required intubation. PMT consulted to discuss [place].   Clinical Assessment and Goals of Care: I have reviewed medical records including EPIC notes, labs and imaging, received report from [place] NP with PCCM, assessed the patient and then met with patient's wife Colletta [place],  to discuss diagnosis prognosis, Tangent, EOL wishes, disposition and options.  I introduced Palliative Medicine as specialized medical care for people living with serious illness. It focuses on providing relief from the symptoms and stress of a serious illness. The goal is to improve quality of life for both the patient and the family.  We discussed a brief life review of the patient. 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 He traveled a lot with work.    As far as functional and nutritional status, she speaks of a recent decline. For a few months he has become weaker - especially in his lower extremities - he required assistance from her for ADLs. He has also had a poor appetite - improves when he is taking steroids. Has had some weight loss. Wife has noticed cognitive changes as well - especially over the last week. Wife shares about how hard chemo has been on [name]'s  body and how he is no longer able to tolerate it.    We discussed patient's current illness and what it means in the larger context of patient's on-going co-morbidities.  We discussed his decline since his cancer diagnosis - initially he did very well but has recently become more frail. We discussed his seizures and current AMS r/t seizures/medications/brain mets. Discussed additional brain mets. Discussed current dependence on ventilator support - hopeful for extubation soon.   I attempted to elicit values and goals of care important to the patient. Wife is very focused on getting through acute issues and then readdressing goals of care - feels as though she cannot think further than current acute issues right now. We speak of "letting the dust settle" and then further addressing goals of care. She is hopeful for improvement is [name]'s mental status and ability to discuss goals of care with him. She shares that topics around goals of care, advance directives, and code status are not topics she and [name] have discussed before.    We discussed code status - wife is okay with reintubation if [name] fails extubation. We could readdress goals of care after he is  reintubated but for now she would want another attempt. We also discuss CPR - again, in current acute situation she is okay with CPR. We discuss the need to discuss this further later on because we will reach a point where CPR will not be appropriate - she agrees.   Discussed with wife the importance of continued conversation with  family and the medical providers regarding overall plan of care and treatment options, ensuring decisions are within the context of the patient's values and GOCs.    Wife shares her need to cling to "the good" that she is currently seeing and desire to have hope. Emotional support provided.   Wife provided with my contact information and we discussed readdressing goals of care soon as we see how [name] does throughout hospitalization - she is hopeful he is able to participate in discussion.   Questions and concerns were addressed. The family was encouraged to call with questions or concerns.    Primary Decision Maker NEXT OF KIN - wife Colletta Maryland - patient currently unable  SUMMARY OF RECOMMENDATIONS   - continue full code/full scope measures for not - PMT will follow along and wife is open to future conversations depending on how patient does - at this point, wife focused on getting through current acute situation and then further discussing goals of care later - she is hopeful for extubation soon  Code Status/Advance Care Planning: Full code   Additional Recommendations (Limitations, Scope, Preferences): Full Scope Treatment  Discharge Planning: To Be Determined      Primary Diagnoses: Present on Admission:  Extensive stage primary small cell carcinoma of lung ([place]) with known metastasis to brain  GERD (gastroesophageal reflux disease)  Essential hypertension  Acute metabolic encephalopathy with right sided weakness and acute aphasia   Malignant melanoma of skin of left lower leg (HCC)  Benign prostatic hyperplasia without lower urinary tract symptoms  CKD (chronic kidney disease) stage 3, GFR 30-59 ml/min (HCC)  Thrombocytopenia ([place])   I have reviewed the medical record, interviewed the patient and family, and examined the patient. The following aspects are pertinent.  Past Medical History:  Diagnosis Date   Cancer [facility])    recently discovered melanoma   GERD  (gastroesophageal reflux disease)    Goals of care, counseling/discussion 06/19/2019   History of hiatal hernia    Hypertension    recently placed on new blood pressure med   Malignant melanoma of skin of left lower leg ([place]) 07/28/2015   Social History   Socioeconomic History   Marital status: Married    Spouse name: Not on file   Number of children: Not on file   Years of education: Not on file   Highest education level: Not on file  Occupational History   Not on file  Tobacco Use   Smoking status: Former    Packs/day: 1.00    Years: 50.00    Pack years: 50.00    Types: Cigarettes   Smokeless tobacco: Never   Tobacco comments:    quit  06/03/2019  Vaping Use   Vaping Use: Never used  Substance and Sexual Activity   Alcohol use: Not Currently    Alcohol/week: 0.0 standard drinks    Comment: socially....beer  wine & liquor   Drug use: No   Sexual activity: Not Currently  Other Topics Concern   Not on file  Social History Narrative   Not on file   Social Determinants of Health   Financial Resource Strain: Not on file  Food Insecurity: Not on file  Transportation Needs: Not on file  Physical Activity: Not on file  Stress: Not on file  Social Connections: Not on file   Family History  Problem Relation Age of Onset   Colon cancer Neg Hx    Esophageal cancer Neg Hx    Scheduled Meds:  chlorhexidine  15 mL Mouth Rinse BID   chlorhexidine gluconate (MEDLINE KIT)  15 mL Mouth Rinse BID   Chlorhexidine Gluconate Cloth  6 each Topical Daily   mouth rinse  15 mL Mouth Rinse 10 times per day   mupirocin ointment  1 application Nasal BID   pantoprazole (PROTONIX) IV  40 mg Intravenous QHS   phenytoin (DILANTIN) IV  100 mg Intravenous Q8H   sodium chloride flush  10-40 mL Intracatheter Q12H   Continuous Infusions:  levETIRAcetam 400 mL/hr at 12/02/20 1000   PRN Meds:.docusate sodium, polyethylene glycol, sodium chloride flush Allergies  Allergen Reactions   Other  Itching and Other (See Comments)    Seasonal allergies: Sinus headaches, sneezing, itchy eyes   Review of Systems  Unable to perform ROS: Intubated   Physical Exam Constitutional:      Comments: sedated  Cardiovascular:     Rate and Rhythm: Normal rate and regular rhythm.  Pulmonary:     Comments: On vent - weaning Skin:    General: Skin is warm and dry.    Vital Signs: BP (!) 145/67   Pulse 89   Temp 97.8 F (36.6 C) (Axillary)   Resp 20   Ht 5' 7"  (1.702 m)   Wt 66.8 kg   SpO2 100%   BMI 23.07 kg/m  Pain Scale: CPOT       SpO2: SpO2: 100 % O2 Device:SpO2: 100 % O2 Flow Rate: .   IO: Intake/output summary:  Intake/Output Summary (Last 24 hours) at 12/02/2020 1335 Last data filed at 12/02/2020 1300 Gross per 24 hour  Intake 1972.43 ml  Output 1425 ml  Net 547.43 ml    LBM: Last BM Date:  (pta) Baseline Weight: Weight: 67 kg Most recent weight: Weight: 66.8 kg     Palliative Assessment/Data: PPS 30%    Time Total: 85 minutes Greater than 50%  of this time was spent counseling and coordinating care related to the above assessment and plan.  Juel Burrow, DNP, AGNP-C Palliative Medicine Team [phone] Pager: [phone]

## 2020-12-03 DIAGNOSIS — Z515 Encounter for palliative care: Secondary | ICD-10-CM | POA: Diagnosis not present

## 2020-12-03 DIAGNOSIS — R569 Unspecified convulsions: Secondary | ICD-10-CM

## 2020-12-03 DIAGNOSIS — C3490 Malignant neoplasm of unspecified part of unspecified bronchus or lung: Secondary | ICD-10-CM | POA: Diagnosis not present

## 2020-12-03 DIAGNOSIS — Z7189 Other specified counseling: Secondary | ICD-10-CM | POA: Diagnosis not present

## 2020-12-03 LAB — BASIC METABOLIC PANEL
Anion gap: 6 (ref 5–15)
BUN: 16 mg/dL (ref 8–23)
CO2: 25 mmol/L (ref 22–32)
Calcium: 8 mg/dL — ABNORMAL LOW (ref 8.9–10.3)
Chloride: 104 mmol/L (ref 98–111)
Creatinine, Ser: 1.6 mg/dL — ABNORMAL HIGH (ref 0.61–1.24)
GFR, Estimated: 46 mL/min — ABNORMAL LOW (ref >60)
Glucose, Bld: 123 mg/dL — ABNORMAL HIGH (ref 70–99)
Potassium: 4.1 mmol/L (ref 3.5–5.1)
Sodium: 135 mmol/L (ref 135–145)

## 2020-12-03 LAB — CBC
HCT: 30.3 % — ABNORMAL LOW (ref 39.0–52.0)
Hemoglobin: 10.3 g/dL — ABNORMAL LOW (ref 13.0–17.0)
MCH: 33.1 pg (ref 26.0–34.0)
MCHC: 34 g/dL (ref 30.0–36.0)
MCV: 97.4 fL (ref 80.0–100.0)
Platelets: 106 10*3/uL — ABNORMAL LOW (ref 150–400)
RBC: 3.11 MIL/uL — ABNORMAL LOW (ref 4.22–5.81)
RDW: 18.5 % — ABNORMAL HIGH (ref 11.5–15.5)
WBC: 9.3 10*3/uL (ref 4.0–10.5)
nRBC: 0 % (ref 0.0–0.2)

## 2020-12-03 LAB — CORTISOL-AM, BLOOD: Cortisol - AM: 9.8 ug/dL (ref 6.7–22.6)

## 2020-12-03 LAB — GLUCOSE, CAPILLARY
Glucose-Capillary: 132 mg/dL — ABNORMAL HIGH (ref 70–99)
Glucose-Capillary: 125 mg/dL — ABNORMAL HIGH (ref 70–99)
Glucose-Capillary: 142 mg/dL — ABNORMAL HIGH (ref 70–99)
Glucose-Capillary: 204 mg/dL — ABNORMAL HIGH (ref 70–99)
Glucose-Capillary: 131 mg/dL — ABNORMAL HIGH (ref 70–99)
Glucose-Capillary: 188 mg/dL — ABNORMAL HIGH (ref 70–99)

## 2020-12-03 LAB — PHENYTOIN LEVEL, TOTAL: Phenytoin Lvl: 18.9 ug/mL (ref 10.0–20.0)

## 2020-12-03 LAB — PHOSPHORUS
Phosphorus: 3.9 mg/dL (ref 2.5–4.6)
Phosphorus: 3.6 mg/dL (ref 2.5–4.6)

## 2020-12-03 LAB — MAGNESIUM
Magnesium: 1.8 mg/dL (ref 1.7–2.4)
Magnesium: 2.1 mg/dL (ref 1.7–2.4)

## 2020-12-03 MED ORDER — DEXAMETHASONE SODIUM PHOSPHATE 4 MG/ML IJ SOLN
4.0000 mg | Freq: Four times a day (QID) | INTRAMUSCULAR | Status: DC
  Administered 2020-12-03 – 2020-12-04 (×5): 4 mg via INTRAVENOUS
  Filled 2020-12-03 (×5): qty 1

## 2020-12-03 MED ORDER — MAGNESIUM SULFATE 2 GM/50ML IV SOLN
2.0000 g | Freq: Once | INTRAVENOUS | Status: AC
  Administered 2020-12-03: 2 g via INTRAVENOUS
  Filled 2020-12-03: qty 50

## 2020-12-03 MED ORDER — LEVETIRACETAM IN NACL 500 MG/100ML IV SOLN
500.0000 mg | Freq: Two times a day (BID) | INTRAVENOUS | Status: DC
  Administered 2020-12-03 – 2020-12-11 (×18): 500 mg via INTRAVENOUS
  Filled 2020-12-03 (×18): qty 100

## 2020-12-03 MED ORDER — ENOXAPARIN SODIUM 40 MG/0.4ML IJ SOSY
40.0000 mg | PREFILLED_SYRINGE | INTRAMUSCULAR | Status: DC
  Administered 2020-12-03 – 2020-12-17 (×15): 40 mg via SUBCUTANEOUS
  Filled 2020-12-03 (×15): qty 0.4

## 2020-12-03 NOTE — Progress Notes (Addendum)
Nutrition Follow-up  DOCUMENTATION CODES:   Not applicable  INTERVENTION:   Monitor goals of care, cortrak tube is not recommended for home use. If continued aggressive care will need to establish nutrition plan for home.   Tube feeding via Cortrak tube: Osmolite 1.5 at 55 ml/h (1320 ml per day) Prosource TF 45 ml TID  Provides 2100 kcal, 115 gm protein, 1003 ml free water daily  MVI with minerals daily via tube   NUTRITION DIAGNOSIS:   Increased nutrient needs related to cancer and cancer related treatments as evidenced by estimated needs. Ongoing.   GOAL:   Patient will meet greater than or equal to 90% of their needs Met with TF.   MONITOR:   TF tolerance, Labs  REASON FOR ASSESSMENT:   Ventilator, Consult Enteral/tube feeding initiation and management  ASSESSMENT:   Pt with PMH of active small cell lung cancer with mets to the brain and liver dx 05/2019, started chemo 07/2019, s/p salvage SRS on 07/23/2019, seizures 09/2019 s/p SRS 10/05/20, HTN, and LLE malignant melanoma. Pt recently stopped chemo 11/11/20 due to difficulty with therapy now admitted with seizures.    Pt discussed during ICU rounds and with RN. Palliative consulted and in with pt and family currently.  Per RN plan to transfer to floor today and wife would like to take patient home.  Current TF advancing, currently at 45 ml/h. Can advance to goal now, orders updated.   6/29 extubated; s/p cortrak placement (tip gastric)  Medications reviewed and include: decadron, MVI with minerals, protonix Keppra Mag sulfate x 1  Labs reviewed Vitamin D: 34   Diet Order:   Diet Order             Diet NPO time specified  Diet effective now                   EDUCATION NEEDS:   Not appropriate for education at this time  Skin:  Skin Assessment: Reviewed RN Assessment (skin tear L elbow; MASD perineum)  Last BM:  unknown  Height:   Ht Readings from Last 1 Encounters:  12/01/20 5' 7"  (1.702 m)     Weight:   Wt Readings from Last 1 Encounters:  12/02/20 66.8 kg    Ideal Body Weight:  (P) 67.2 kg  BMI:  Body mass index is 23.07 kg/m.  Estimated Nutritional Needs:   Kcal:  2000-2300  Protein:  115-125 grams  Fluid:  >2 L/day  Lockie Pares., RD, LDN, CNSC See AMiON for contact information

## 2020-12-03 NOTE — Progress Notes (Addendum)
Neurology Progress Note   S:// Seen and examined Extubated Wife at bedside Detail discussion with the wife-see below.    O:// Current vital signs: BP (!) 144/72   Pulse 89   Temp 98.8 F (37.1 C) (Axillary)   Resp (!) 22   Ht 5\' 7"  (1.702 m)   Wt 66.8 kg   SpO2 100%   BMI 23.07 kg/m  Vital signs in last 24 hours: Temp:  [98.2 F (36.8 C)-99.4 F (37.4 C)] 98.8 F (37.1 C) (06/30 0700) Pulse Rate:  [73-101] 89 (06/30 0900) Resp:  [14-26] 22 (06/30 0900) BP: (111-165)/(61-90) 144/72 (06/30 0900) SpO2:  [100 %] 100 % (06/30 0900) FiO2 (%):  [40 %] 40 % (06/29 1119) General: Very drowsy, opens eyes to voice HEENT: Normocephalic atraumatic CVs: Regular rhythm Respiratory: Extubated yesterday, breathing well saturating normally on room air Abdomen nondistended nontender Neurological exam Very drowsy, opens eyes to voice Says yes and what to questions. Was able to name his wife but did not name objects. Speech is moderately dysarthric Cranial nerves: Pupils are equal round reactive light, gaze is midline, does not follow commands consistently to look to both sides but does not seem to have restricted or forced gaze, no facial asymmetry. Motor examination: Is able to move the right arm against gravity, minimal to no movement in the left upper extremity.  Is able to wiggle toes-again strongly on the right in comparison to the left. Sensory exam: Withdraws right upper and lower extremity to some extent with weak withdrawal in the left lower extremity and minimal to no withdrawal in the left upper extremity. Coordination difficult to assess due to his limited ability to follow commands  Medications  Current Facility-Administered Medications:    chlorhexidine (PERIDEX) 0.12 % solution 15 mL, 15 mL, Mouth Rinse, BID, Chand, Sudham, MD, 15 mL at 12/02/20 2207   Chlorhexidine Gluconate Cloth 2 % PADS 6 each, 6 each, Topical, Daily, Jacky Kindle, MD, 6 each at 12/02/20 2100    docusate sodium (COLACE) capsule 100 mg, 100 mg, Oral, BID PRN, Estill Cotta, NP   enoxaparin (LOVENOX) injection 40 mg, 40 mg, Subcutaneous, Q24H, Chand, Sudham, MD   feeding supplement (OSMOLITE 1.5 CAL) liquid 1,000 mL, 1,000 mL, Per Tube, Continuous, Chand, Sudham, MD, Last Rate: 45 mL/hr at 12/03/20 0800, Rate Change at 12/03/20 0800   feeding supplement (PROSource TF) liquid 45 mL, 45 mL, Per Tube, TID, Jacky Kindle, MD, 45 mL at 12/02/20 2207   levETIRAcetam (KEPPRA) IVPB 500 mg/100 mL premix, 500 mg, Intravenous, Q12H, Amie Portland, MD   magnesium sulfate IVPB 2 g 50 mL, 2 g, Intravenous, Once, Jacky Kindle, MD   MEDLINE mouth rinse, 15 mL, Mouth Rinse, q12n4p, Chand, Sudham, MD, 15 mL at 12/02/20 1622   multivitamin with minerals tablet 1 tablet, 1 tablet, Per Tube, Daily, Jacky Kindle, MD, 1 tablet at 12/02/20 1513   mupirocin ointment (BACTROBAN) 2 % 1 application, 1 application, Nasal, BID, Jacky Kindle, MD, 1 application at 19/41/74 2207   pantoprazole (PROTONIX) injection 40 mg, 40 mg, Intravenous, QHS, Hillery Aldo S, NP, 40 mg at 12/02/20 2207   phenytoin (DILANTIN) injection 100 mg, 100 mg, Intravenous, Q8H, Toberman, Stevi W, NP, 100 mg at 12/03/20 0520   polyethylene glycol (MIRALAX / GLYCOLAX) packet 17 g, 17 g, Oral, Daily PRN, Estill Cotta, NP   sodium chloride flush (NS) 0.9 % injection 10-40 mL, 10-40 mL, Intracatheter, Q12H, Chand, Currie Paris, MD, 10 mL at 12/02/20 2207  sodium chloride flush (NS) 0.9 % injection 10-40 mL, 10-40 mL, Intracatheter, PRN, Jacky Kindle, MD  Facility-Administered Medications Ordered in Other Encounters:    sodium chloride flush (NS) 0.9 % injection 10 mL, 10 mL, Intravenous, PRN, Volanda Napoleon, MD, 10 mL at 10/21/19 0912 Labs CBC    Component Value Date/Time   WBC 9.3 12/03/2020 0522   RBC 3.11 (L) 12/03/2020 0522   HGB 10.3 (L) 12/03/2020 0522   HGB 8.5 (L) 11/24/2020 0830   HGB 16.1 01/30/2017 0804   HCT 30.3 (L)  12/03/2020 0522   HCT 44.9 01/30/2017 0804   PLT 106 (L) 12/03/2020 0522   PLT 97 (L) 11/24/2020 0830   PLT 186 01/30/2017 0804   MCV 97.4 12/03/2020 0522   MCV 101 (H) 01/30/2017 0804   MCH 33.1 12/03/2020 0522   MCHC 34.0 12/03/2020 0522   RDW 18.5 (H) 12/03/2020 0522   RDW 11.8 01/30/2017 0804   LYMPHSABS 2.1 12/01/2020 1050   LYMPHSABS 1.8 01/30/2017 0804   MONOABS 1.0 12/01/2020 1050   EOSABS 0.1 12/01/2020 1050   EOSABS 0.1 01/30/2017 0804   BASOSABS 0.0 12/01/2020 1050   BASOSABS 0.0 01/30/2017 0804    CMP     Component Value Date/Time   NA 135 12/03/2020 0522   NA 142 01/30/2017 0804   K 4.1 12/03/2020 0522   K 4.1 01/30/2017 0804   CL 104 12/03/2020 0522   CL 105 01/25/2016 0926   CO2 25 12/03/2020 0522   CO2 29 01/30/2017 0804   GLUCOSE 123 (H) 12/03/2020 0522   GLUCOSE 105 01/30/2017 0804   GLUCOSE 113 01/25/2016 0926   BUN 16 12/03/2020 0522   BUN 6.9 (L) 01/30/2017 0804   CREATININE 1.60 (H) 12/03/2020 0522   CREATININE 1.53 (H) 11/24/2020 0830   CREATININE 0.8 01/30/2017 0804   CALCIUM 8.0 (L) 12/03/2020 0522   CALCIUM 10.5 (H) 01/30/2017 0804   PROT 5.8 (L) 12/01/2020 1050   PROT 7.9 01/30/2017 0804   ALBUMIN 3.4 (L) 12/01/2020 1050   ALBUMIN 4.5 01/30/2017 0804   AST 19 12/01/2020 1050   AST 13 (L) 11/24/2020 0830   AST 34 01/30/2017 0804   ALT 11 12/01/2020 1050   ALT 7 11/24/2020 0830   ALT 18 01/30/2017 0804   ALKPHOS 77 12/01/2020 1050   ALKPHOS 79 01/30/2017 0804   BILITOT 0.6 12/01/2020 1050   BILITOT 0.4 11/24/2020 0830   BILITOT 1.49 (H) 01/30/2017 0804   GFRNONAA 46 (L) 12/03/2020 0522   GFRNONAA 48 (L) 11/24/2020 0830   GFRAA >60 03/02/2020 1135  Ammonia level 18  Imaging I have reviewed images in epic and the results pertinent to this consultation are: MRI reviewed-please see note from yesterday.  Assessment:  70 year old with past medical history of hypertension, malignant melanoma of the left lower leg, small cell lung  cancer with metastasis to the liver and brain presented as a code stroke with left gaze deviation and unresponsiveness--exam consistent with seizure and status epilepticus that has now resolved. Imaging negative for new stroke but brain MRI reveals new mets-in the brainstem and in the left temporal lobe with resolution of prior seen metastases and extensive leukoencephalopathy. Examination compared to yesterday is mildly improved but still remains very lethargic I had a detailed discussion with his wife and explained his current situation which is stable but at this point, given the extensive leukoencephalopathy and new brain metastases and pre-existing underlying cancer with mets not only to the brain but also to  other organs, recovery either being incomplete or being very prolonged with chances of poor neurologically meaningful recovery. She is at this point is not ready to make any decisions on goals of care or CODE STATUS. We had lengthy conversations where she would like to first see how he responds to changes in the medication and with time. Overnight EEG was discontinued yesterday after an extended amount of reading did not reveal any ongoing seizure activity.  Clinically also he does not appear to be seizing at this time. PMT has been involved-appreciate their consultation, but the wife still very reluctant to even engage in goals of care conversations.  Recommendations: -Decrease Keppra to 500 twice daily(was on 1000 twice daily) -Continue Dilantin 100 3 times daily-check Dilantin levels -Appreciate pharmacy assistance with downgrade to management -IV Dexamethasone-4 mg every 6 hours. -Transfer to floor after the middle of the day when appropriate.  Discussed my plan with PCCM attending Dr. Tacy Learn and patient's wife at bedside.  -- Amie Portland, MD Neurologist [facility] Pager: [phone]  CRITICAL CARE ATTESTATION Performed by: Amie Portland, MD Total critical care  time:33 minutes Critical care time was exclusive of separately billable procedures and treating other patients and/or supervising APPs/Residents/Students Critical care was necessary to treat or prevent imminent or life-threatening deterioration due to brain metastases from small cell lung cancer, possible status epilepticus. This patient is critically ill and at significant risk for neurological worsening and/or death and care requires constant monitoring. Critical care was time spent personally by me on the following activities: development of treatment plan with patient and/or surrogate as well as nursing, discussions with consultants, evaluation of patient's response to treatment, examination of patient, obtaining history from patient or surrogate, ordering and performing treatments and interventions, ordering and review of laboratory studies, ordering and review of radiographic studies, pulse oximetry, re-evaluation of patient's condition, participation in multidisciplinary rounds and medical decision making of high complexity in the care of this patient.

## 2020-12-03 NOTE — Progress Notes (Signed)
This chaplain responded to PMT consult for family spiritual care.  The chaplain offered a reflective listening presence and emotional support to the Pt. wife-Walter Hernandez.  The Pt. brother in law-Walter Hernandez is bedside and decided to leave the room and return later.  The chaplain understands Walter Hernandez has been the Pt. caregiver for about 1 1/2 yrs.  The chaplain listened reflectively as Walter Hernandez recognizes the emotions she is experiencing as the medical team continues to ask the "what if" questions.  Walter Hernandez hopes there will be time to experience, treat, and pause, taking "one day at a time" with the goal of bringing him home and talking to him again. The chaplain affirmed Walter Hernandez's presence and honored her ability to speak of the Pt. previous experiences and to the medical team.   The chaplain understands Walter Hernandez is leaning on her faith for wisdom and patience. Walter Hernandez also has the support of family.  Walter Hernandez is nervous about the transition to another unit and has requested input from the RN about what to expect.  Walter Hernandez accepted the chaplain's invitation for prayer as the chaplain affirmed their time together. This chaplain is available for F/U spiritual care as needed. The Pt. RN was updated as the chaplain left the room.

## 2020-12-03 NOTE — Progress Notes (Signed)
NAME:  Oswaldo Cueto, MRN:  [id number], DOB:  April 14, 1951, LOS: 2 ADMISSION DATE:  12/01/2020, CONSULTATION DATE:  6/28 REFERRING MD:  Regenia Skeeter MD, CHIEF COMPLAINT:  Seizure   History of Present Illness:  Patient is encephalopathic. Therefore history has been obtained from chart review and from wife at bedside.  Moris Ratchford is an 70 y.o. who presented to the [facility] ED via EMS with concern for seizure  Mr  Spruce lives at home with his wife. 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  Per his wife, Mr. Stankovich is a patient of Dr. Marin Olp and he was diagnosed with small cell lung cancer with mets to the brain and liver in December of 2020 and started chemotherapy in February of 2021. An MRI in February of 2022 showed worsening brain mets. He underwent salvage SRS on 2/16. He was admitted for seizures on 09/05/20. MRI in march of 2022 showed worsening brain mets again. He underwent SRS a second time on 10/05/2020. In his last visit with Dr. Marin Olp on 11/11/2020 chemotherapy was stopped due to the patient having some difficulty with the thearpy.  On 6/28 the patient had woken up at 7AM, expressed some fatigue, and desire to take a nap around 10 AM. A short time later he was confused, not following commands, and aphasic. EMS was called.  Presentation at [facility] was notable for left gaze preference, aphasia, left side weakness. A code stroke ws called and Neurology was consulted. NIHSS score was 20. CT head was negative for acute abnormality. MRI was negative for acute infarct. IV decadron was given due to history of brain mets. Presentation was then felt to be from seizure with post-ictal state. 1 MG ativan was given. EEG showed lateralized rhythmic delta activity in the left hemisphere. There was no clinical improvement post ativan so another 2mg  ativan along with 200mg  Vimpat, 2000mg  keppra, and 20mg /kg fosphenytoin were given per documentation  in the ED.   PCCM was consulted for admission and consideration for intubation. He remains critically ill on the 4N ICU.  Significant Hospital Events: Including procedures, antibiotic start and stop dates in addition to other pertinent events   6/28 Presented to [facility] with concern for seizure. CT head no acute abnormalities. MRI no infarction. EEG> ,Intubated 6/29 EEG was negative for seizures, patient tolerated pressure support trial, successfully extubated  Interim History / Subjective:  Patient was successfully extubated yesterday Still remains encephalopathic Appreciate neurology and palliative care follow-up  Objective   Blood pressure (!) 165/75, pulse 93, temperature 98.8 F (37.1 C), temperature source Axillary, resp. rate (!) 22, height 5\' 7"  (1.702 m), weight 66.8 kg, SpO2 100 %.    Vent Mode: PSV;CPAP FiO2 (%):  [40 %] 40 % PEEP:  [5 cmH20] 5 cmH20 Pressure Support:  [5 cmH20] 5 cmH20   Intake/Output Summary (Last 24 hours) at 12/03/2020 0935 Last data filed at 12/03/2020 0500 Gross per 24 hour  Intake 819.66 ml  Output 975 ml  Net -155.34 ml   Filed Weights   12/01/20 1131 12/01/20 1700 12/02/20 0500  Weight: 67 kg 66.8 kg 66.8 kg    Examination:   Physical exam: General: Acute on chronically ill-appearing male, lying in the bed HEENT: [place]/AT, eyes anicteric.  ETT and OGT in place Neuro: Eyes closed, opens with vocal stimuli, following simple commands, weaker on left upper extremity, moving all other extremities spontaneously Chest: Coarse breath sounds, no  wheezes or rhonchi Heart: Regular rate and rhythm, no murmurs or gallops Abdomen: Soft, nontender, nondistended, bowel sounds present Skin: No rash  Resolved Hospital Problem list   Acute respiratory failure with hypoxia  Assessment & Plan:  Seizure VS Worsening of brain metastasis from Small Cell Lung Cancer Acute Encephalopathy EEG remain negative Patient is very lethargic this could be due to side  effect of Keppra versus encephalopathy Per neurology recommendations decrease Keppra 500 mg twice daily Continue phenytoin Follow-up phenytoin level Neuro oncology recommend no further work-up Currently off sedation and off EEG  Acute Respiratory Failure with Hypoxia Due to seizures Patient tolerated pressure support trial, was extubated yesterday Currently on room air  Hyponatremia, Hypomagnesia  SIADH secondary to brain mets. NA 134>133. Appears to be chronically hyponatremic per chart review. MG 1.0 Continue aggressive electrolyte supplement  AKI on CKD 3a Closely monitor serum creatinine, currently trending down Avoid neprotoxic drugs as possible.  Normocytic Anemia- suspect chronic secondary to chemo Thrombocytopenia Cell counts are stable Continue to monitor  Metastatic lung cancer to brain and liver, also remote h/o malignant melanoma.  Neuro oncology recommend no further work-up Palliative care is consulted Neurology is following  Best Practice (right click and "Reselect all SmartList Selections" daily)   Diet/type: Tube feeds DVT prophylaxis: Subcu Lovenox GI prophylaxis: PPI Lines: N/A Port accessed Foley:  N/A Code Status:  full code Last date of multidisciplinary goals of care discussion [6/30 at bedside with wife, ongoing [place] conversation] Disposition: Transfer to medical floor    Jacky Kindle MD [place] for pager If no response to pager, please call [phone] until 7pm After 7pm, Please call E-link [phone]

## 2020-12-03 NOTE — Progress Notes (Signed)
Daily Progress Note   Patient Name: Walter Hernandez       Date: 12/03/2020 DOB: [birth year]  Age: 70 y.o. MRN#: [id number] Attending Physician: Jacky Kindle, MD Primary Care Physician: Lavone Orn, MD Admit Date: 12/01/2020  Reason for Consultation/Follow-up: Establishing goals of care  Subjective: Patient has been extubated - no verbal interaction. Wife at bedside.   Length of Stay: 2  Current Medications: Scheduled Meds:   chlorhexidine  15 mL Mouth Rinse BID   Chlorhexidine Gluconate Cloth  6 each Topical Daily   dexamethasone (DECADRON) injection  4 mg Intravenous Q6H   enoxaparin (LOVENOX) injection  40 mg Subcutaneous Q24H   feeding supplement (PROSource TF)  45 mL Per Tube TID   mouth rinse  15 mL Mouth Rinse q12n4p   multivitamin with minerals  1 tablet Per Tube Daily   mupirocin ointment  1 application Nasal BID   pantoprazole (PROTONIX) IV  40 mg Intravenous QHS   phenytoin (DILANTIN) IV  100 mg Intravenous Q8H   sodium chloride flush  10-40 mL Intracatheter Q12H    Continuous Infusions:  feeding supplement (OSMOLITE 1.5 CAL) 45 mL/hr at 12/03/20 0800   levETIRAcetam 500 mg (12/03/20 1016)   magnesium sulfate bolus IVPB      PRN Meds: docusate sodium, polyethylene glycol, sodium chloride flush  Physical Exam Constitutional:      Comments: Lethargic, weak  Pulmonary:     Effort: Pulmonary effort is normal.  Skin:    General: Skin is warm and dry.  Neurological:     Mental Status: He is disoriented.            Vital Signs: BP 138/80   Pulse 87   Temp 98.8 F (37.1 C) (Axillary)   Resp 14   Ht 5\' 7"  (1.702 m)   Wt 66.8 kg   SpO2 100%   BMI 23.07 kg/m  SpO2: SpO2: 100 % O2 Device: O2 Device: Nasal Cannula O2 Flow Rate: O2 Flow Rate (L/min): 2 L/min  Intake/output  summary:  Intake/Output Summary (Last 24 hours) at 12/03/2020 1125 Last data filed at 12/03/2020 0800 Gross per 24 hour  Intake 668.05 ml  Output 950 ml  Net -281.95 ml   LBM: Last BM Date:  (pta) Baseline Weight: Weight: 67 kg Most recent weight: Weight: 66.8 kg       Palliative Assessment/Data: PPS 30%    Flowsheet Rows    Flowsheet Row Most Recent Value  Intake Tab   Referral Department Critical care  Unit at Time of Referral ICU  Palliative Care Primary Diagnosis Cancer  Date Notified 12/01/20  Palliative Care Type New Palliative care  Reason for referral Clarify Goals of Care  Date of Admission 12/01/20  Date first seen by Palliative Care 12/02/20  # of days Palliative referral response time 1 Day(s)  # of days IP prior to Palliative referral 0  Clinical Assessment   Psychosocial & Spiritual Assessment   Palliative Care Outcomes        Patient Active Problem List   Diagnosis Date Noted   Seizure ([place]) 27/78/2423   Acute metabolic encephalopathy with right sided weakness and acute aphasia  12/01/2020   Benign prostatic hyperplasia without lower  urinary tract symptoms 12/01/2020   Acute respiratory failure with hypoxia ([place]) 12/01/2020   CKD (chronic kidney disease) stage 3, GFR 30-59 ml/min (HCC) 12/01/2020   Alteration in electrolyte and fluid balance 12/01/2020   Thrombocytopenia (HCC)    Encounter for intubation    Goals of care, counseling/discussion 06/19/2019   Extensive stage primary small cell carcinoma of lung ([place]) with known metastasis to brain 06/11/2019   Brain metastases ([place]) 06/03/2019   Essential hypertension 06/03/2019   GERD (gastroesophageal reflux disease) 06/03/2019   Malignant melanoma of skin of left lower leg ([place]) 07/28/2015    Palliative Care Assessment & Plan   HPI: 70 y.o. 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  Patient was diagnosed with small cell lung cancer with mets to the brain and liver in December of 2020 and started chemotherapy in February of 2021. An MRI in February of 2022 showed worsening brain mets. He underwent salvage SRS on 2/16. He was admitted for seizures on 09/05/20. MRI in march of 2022 showed worsening brain mets again. He underwent SRS a second time on 10/05/2020. In his last visit with Dr. Marin Olp on 11/11/2020 chemotherapy was stopped due to the patient having some difficulty with the thearpy. This admission, CT head was negative for acute abnormality and MRI was negative for acute infarct. Multiple medications given for seizure activity. Patient experienced acute respiratory failure and required intubation. PMT consulted to discuss [place].   Assessment: Follow up today with patient's wife.  She is grieving - expresses grief over current situation and lack of improvement. We discussed that he has been extubated and appears to be protecting his airway.   She tells me she fully understands severity of situation. She understands patient will not return to his baseline. However she is also clear that she must maintain hope right now and continues to focus on getting through current acute illness before addressing larger goals of care.  She shares that one important goal is going home - she really does not want [name] to go to any facility. She is not sure yet what kind of support she will need at home but would like to work that out.   We discuss continuing current interventions - she would like to maintain full code status. We discuss that PMT will remain involved and help assess how Garth Schlatter is doing in coming days and try to provide guidance to her. She is agreeable.  We discussed extra support through chaplain and she is agreeable.   Recommendations/Plan: Ongoing support from PMT and goals of care conversations For now wife requests continued full scope care and full code status She does seem  to understand severity of situation and poor prognosis - understands patient will not return to baseline but she is clinging to hope from some improvement - she is very hopeful for ability to have a conversation with him [place] with PMT  Code Status: Full code  Discharge Planning: To Be Determined  Care plan was discussed with patient's spouse  Thank you for allowing the Palliative Medicine Team to assist in the care of this patient.   Total Time 40 minutes Prolonged Time Billed  no       Greater than 50%  of this time was spent counseling and coordinating care related to the above assessment and plan.  Juel Burrow, DNP, AGNP-C Palliative Medicine  Team Team Phone # [phone]  Pager [phone]

## 2020-12-04 DIAGNOSIS — Z515 Encounter for palliative care: Secondary | ICD-10-CM | POA: Diagnosis not present

## 2020-12-04 DIAGNOSIS — N189 Chronic kidney disease, unspecified: Secondary | ICD-10-CM

## 2020-12-04 DIAGNOSIS — C3490 Malignant neoplasm of unspecified part of unspecified bronchus or lung: Secondary | ICD-10-CM

## 2020-12-04 DIAGNOSIS — R1310 Dysphagia, unspecified: Secondary | ICD-10-CM

## 2020-12-04 DIAGNOSIS — D696 Thrombocytopenia, unspecified: Secondary | ICD-10-CM

## 2020-12-04 DIAGNOSIS — G9340 Encephalopathy, unspecified: Secondary | ICD-10-CM

## 2020-12-04 DIAGNOSIS — C787 Secondary malignant neoplasm of liver and intrahepatic bile duct: Secondary | ICD-10-CM

## 2020-12-04 DIAGNOSIS — C7931 Secondary malignant neoplasm of brain: Secondary | ICD-10-CM

## 2020-12-04 DIAGNOSIS — J9601 Acute respiratory failure with hypoxia: Secondary | ICD-10-CM | POA: Diagnosis not present

## 2020-12-04 DIAGNOSIS — D509 Iron deficiency anemia, unspecified: Secondary | ICD-10-CM

## 2020-12-04 LAB — CBC WITH DIFFERENTIAL/PLATELET
Abs Immature Granulocytes: 0.19 10*3/uL — ABNORMAL HIGH (ref 0.00–0.07)
Basophils Absolute: 0 10*3/uL (ref 0.0–0.1)
Basophils Relative: 0 %
Eosinophils Absolute: 0 10*3/uL (ref 0.0–0.5)
Eosinophils Relative: 0 %
HCT: 29.8 % — ABNORMAL LOW (ref 39.0–52.0)
Hemoglobin: 9.9 g/dL — ABNORMAL LOW (ref 13.0–17.0)
Immature Granulocytes: 2 %
Lymphocytes Relative: 6 %
Lymphs Abs: 0.7 10*3/uL (ref 0.7–4.0)
MCH: 32.9 pg (ref 26.0–34.0)
MCHC: 33.2 g/dL (ref 30.0–36.0)
MCV: 99 fL (ref 80.0–100.0)
Monocytes Absolute: 0.9 10*3/uL (ref 0.1–1.0)
Monocytes Relative: 8 %
Neutro Abs: 9.2 10*3/uL — ABNORMAL HIGH (ref 1.7–7.7)
Neutrophils Relative %: 84 %
Platelets: 100 10*3/uL — ABNORMAL LOW (ref 150–400)
RBC: 3.01 MIL/uL — ABNORMAL LOW (ref 4.22–5.81)
RDW: 18.4 % — ABNORMAL HIGH (ref 11.5–15.5)
WBC: 10.9 10*3/uL — ABNORMAL HIGH (ref 4.0–10.5)
nRBC: 0 % (ref 0.0–0.2)

## 2020-12-04 LAB — COMPREHENSIVE METABOLIC PANEL
ALT: 8 U/L (ref 0–44)
AST: 11 U/L — ABNORMAL LOW (ref 15–41)
Albumin: 2.8 g/dL — ABNORMAL LOW (ref 3.5–5.0)
Alkaline Phosphatase: 58 U/L (ref 38–126)
Anion gap: 5 (ref 5–15)
BUN: 20 mg/dL (ref 8–23)
CO2: 26 mmol/L (ref 22–32)
Calcium: 8.7 mg/dL — ABNORMAL LOW (ref 8.9–10.3)
Chloride: 104 mmol/L (ref 98–111)
Creatinine, Ser: 1.32 mg/dL — ABNORMAL HIGH (ref 0.61–1.24)
GFR, Estimated: 58 mL/min — ABNORMAL LOW (ref >60)
Glucose, Bld: 176 mg/dL — ABNORMAL HIGH (ref 70–99)
Potassium: 4.5 mmol/L (ref 3.5–5.1)
Sodium: 135 mmol/L (ref 135–145)
Total Bilirubin: 0.4 mg/dL (ref 0.3–1.2)
Total Protein: 5.4 g/dL — ABNORMAL LOW (ref 6.5–8.1)

## 2020-12-04 LAB — GLUCOSE, CAPILLARY
Glucose-Capillary: 186 mg/dL — ABNORMAL HIGH (ref 70–99)
Glucose-Capillary: 146 mg/dL — ABNORMAL HIGH (ref 70–99)
Glucose-Capillary: 163 mg/dL — ABNORMAL HIGH (ref 70–99)
Glucose-Capillary: 157 mg/dL — ABNORMAL HIGH (ref 70–99)
Glucose-Capillary: 147 mg/dL — ABNORMAL HIGH (ref 70–99)
Glucose-Capillary: 161 mg/dL — ABNORMAL HIGH (ref 70–99)

## 2020-12-04 LAB — MAGNESIUM: Magnesium: 1.9 mg/dL (ref 1.7–2.4)

## 2020-12-04 LAB — PHOSPHORUS: Phosphorus: 3.1 mg/dL (ref 2.5–4.6)

## 2020-12-04 MED ORDER — DEXAMETHASONE SODIUM PHOSPHATE 4 MG/ML IJ SOLN
4.0000 mg | Freq: Three times a day (TID) | INTRAMUSCULAR | Status: DC
  Administered 2020-12-04 – 2020-12-12 (×22): 4 mg via INTRAVENOUS
  Filled 2020-12-04 (×22): qty 1

## 2020-12-04 MED ORDER — INSULIN ASPART 100 UNIT/ML IJ SOLN
0.0000 [IU] | INTRAMUSCULAR | Status: DC
  Administered 2020-12-04: 1 [IU] via SUBCUTANEOUS
  Administered 2020-12-04 – 2020-12-05 (×4): 2 [IU] via SUBCUTANEOUS
  Administered 2020-12-05: 1 [IU] via SUBCUTANEOUS
  Administered 2020-12-05 (×2): 2 [IU] via SUBCUTANEOUS
  Administered 2020-12-07 (×2): 1 [IU] via SUBCUTANEOUS

## 2020-12-04 MED ORDER — ACETAMINOPHEN 325 MG PO TABS
650.0000 mg | ORAL_TABLET | Freq: Four times a day (QID) | ORAL | Status: DC | PRN
  Administered 2020-12-04 – 2020-12-05 (×2): 650 mg
  Filled 2020-12-04 (×2): qty 2

## 2020-12-04 NOTE — Progress Notes (Signed)
Daily Progress Note   Patient Name: Walter Hernandez       Date: 12/04/2020 DOB: Mar 15, 1951  Age: 70 y.o. MRN#: [id number] Attending Physician: Lavina Hamman, MD Primary Care Physician: Lavone Orn, MD Admit Date: 12/01/2020  Reason for Consultation/Follow-up: Family/Patient support, Non pain symptom management and Pain control   To discuss complex medical decision making related to patient's goals of care  Chart Reviewed. Patient examined. Wife at bedside.   Subjective: Mr. Fedak [facility]) is awake and is arousable to verbal stimuli. Patient is able to respond with 1-2 word answers to questions. [name] endorses he feels uncomfortable in bed (PMT readjusted [name] to ensure comfort). Wife is at bedside and reports that the physician told her Garth Schlatter is improving. She reports he goal is for him to go home with her; however, she is the only one living at home and is his sole care taker. PMT introduced ourselves, established trust, provided support, and addressed any questions or concerns.   Assessment: 70 y.o. patient with metastatic lung cancer with mets to brain and liver and seizures. Patient is awake and able to be aroused through verbal stimuli. Patient is resting in bed, bed bound. He is responsive to questions with 1-2 word answers. No visiable signs of acute distress. No increased work of breathing. Patient is significantly weak.    Patient Profile/HPI: 70 y.o. 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 Patient was diagnosed with small cell lung cancer with mets to the brain and liver in December of 2020 and started chemotherapy in February of 2021. An MRI in February of 2022 showed  worsening brain mets. He underwent salvage SRS on 2/16. He was admitted for seizures on 09/05/20. MRI in march of 2022 showed worsening brain mets again. He underwent SRS a second time on 10/05/2020. In his last visit with Dr. Marin Olp on 11/11/2020 chemotherapy was stopped due to the patient having some difficulty with the thearpy. This admission, CT head was negative for acute abnormality and MRI was negative for acute infarct. Multiple medications given for seizure activity. Patient experienced acute respiratory failure and required intubation. PMT consulted to discuss [place].    Length of Stay: 3   Vital Signs: BP Marland Kitchen)  152/93   Pulse 78   Temp 98.7 F (37.1 C) (Axillary)   Resp 17   Ht 5\' 7"  (1.702 m)   Wt 65.8 kg   SpO2 100%   BMI 22.72 kg/m  SpO2: SpO2: 100 % O2 Device: O2 Device: Room Air O2 Flow Rate: O2 Flow Rate (L/min): 2 L/min       Palliative Assessment/Data: 20% PPS      Palliative Care Plan    Recommendations/Plan: Ongoing support from PMT and goals of care conversations - Refrained of [place] conversations today Wife requests full scope care and full code status for now - She identifies her goal is to get him home; however, she recognizes she will need a lot of assistance (I.e. mobility aids) for the patient  Wife continues to remain hopeful for continued improvement  Chaplain support Dorian Pod with PMT  Code Status:  Full code  Prognosis:  Unable to determine   Discharge Planning: To Be Determined  Care plan was discussed with Patient and Wife at bedside.   Thank you for allowing the Palliative Medicine Team to assist in the care of this patient.  Total time spent:  35 min     Greater than 50%  of this time was spent counseling and coordinating care related to the above assessment and plan.  Florentina Jenny, PA-C Palliative Medicine Demetrios Isaacs, PA-S2  Please contact Palliative MedicineTeam phone at [phone] for questions and concerns between 7 am - 7 pm.    Please see AMION for individual provider pager numbers.

## 2020-12-04 NOTE — Progress Notes (Signed)
Neurology Progress Note   S:// Seen and examined. Wife at bedside On no sedation-more participatory with exam today.    O:// Current vital signs: BP 128/76   Pulse 82   Temp 98.6 F (37 C) (Oral)   Resp 13   Ht 5\' 7"  (1.702 m)   Wt 66.8 kg   SpO2 100%   BMI 23.07 kg/m  Vital signs in last 24 hours: Temp:  [98.4 F (36.9 C)-99.6 F (37.6 C)] 98.6 F (37 C) (07/01 0800) Pulse Rate:  [79-96] 82 (07/01 0900) Resp:  [12-24] 13 (07/01 0900) BP: (110-157)/(62-87) 128/76 (07/01 0900) SpO2:  [100 %] 100 % (07/01 0900) General: Remains extubated, on room air with no obvious discomfort HEENT: Normocephalic/atraumatic CVs: Regular rate rhythm Respiratory: Symmetric air entry bilaterally Extremities warm well perfused Neurologic exam He was sleeping, opens eyes to voice, answers simple questions appropriately-is able to name his wife, is able to tell me that he is not cold anymore and he was before and it was better after getting blankets. Is able to repeat short sentences.  Although still continues to have poor attention concentration. Cranial nerve examination pupils equal round react light, extraocular movements appear unhindered, no visual field cuts that are very prominent with the limitation of his attention and concentration, symmetric face, midline tongue and palate. Motor examination with right upper extremity antigravity 4+/5, right lower extremity antigravity 4/5, left lower extremity antigravity 4/5 and left upper extremity with initial hesitation to move but when passively lifted up, he has at least a 4/5 strength in the left upper extremity as well-this weakness in the left upper extremity is new as an was not present before his presenting episode. Sensation intact to touch Coordination difficult to assess   Medications  Current Facility-Administered Medications:    chlorhexidine (PERIDEX) 0.12 % solution 15 mL, 15 mL, Mouth Rinse, BID, Chand, Sudham, MD, 15 mL at  12/04/20 0936   Chlorhexidine Gluconate Cloth 2 % PADS 6 each, 6 each, Topical, Daily, Chand, Sudham, MD, 6 each at 12/03/20 1200   dexamethasone (DECADRON) injection 4 mg, 4 mg, Intravenous, Q6H, Amie Portland, MD, 4 mg at 12/04/20 4580   docusate sodium (COLACE) capsule 100 mg, 100 mg, Oral, BID PRN, Estill Cotta, NP   enoxaparin (LOVENOX) injection 40 mg, 40 mg, Subcutaneous, Q24H, Chand, Sudham, MD, 40 mg at 12/04/20 0936   feeding supplement (OSMOLITE 1.5 CAL) liquid 1,000 mL, 1,000 mL, Per Tube, Continuous, Chand, Sudham, MD, Last Rate: 55 mL/hr at 12/03/20 1844, 1,000 mL at 12/03/20 1844   feeding supplement (PROSource TF) liquid 45 mL, 45 mL, Per Tube, TID, Chand, Sudham, MD, 45 mL at 12/04/20 0936   levETIRAcetam (KEPPRA) IVPB 500 mg/100 mL premix, 500 mg, Intravenous, Q12H, Amie Portland, MD, Stopped at 12/04/20 0950   MEDLINE mouth rinse, 15 mL, Mouth Rinse, q12n4p, Chand, Sudham, MD, 15 mL at 12/03/20 1700   multivitamin with minerals tablet 1 tablet, 1 tablet, Per Tube, Daily, Jacky Kindle, MD, 1 tablet at 12/04/20 0936   mupirocin ointment (BACTROBAN) 2 % 1 application, 1 application, Nasal, BID, Jacky Kindle, MD, 1 application at 99/83/38 0941   pantoprazole (PROTONIX) injection 40 mg, 40 mg, Intravenous, QHS, Estill Cotta, NP, 40 mg at 12/03/20 2127   phenytoin (DILANTIN) injection 100 mg, 100 mg, Intravenous, Q8H, Toberman, Stevi W, NP, 100 mg at 12/04/20 0624   polyethylene glycol (MIRALAX / GLYCOLAX) packet 17 g, 17 g, Oral, Daily PRN, Estill Cotta, NP   sodium  chloride flush (NS) 0.9 % injection 10-40 mL, 10-40 mL, Intracatheter, Q12H, Chand, Sudham, MD, 10 mL at 12/03/20 2130   sodium chloride flush (NS) 0.9 % injection 10-40 mL, 10-40 mL, Intracatheter, PRN, Jacky Kindle, MD  Facility-Administered Medications Ordered in Other Encounters:    sodium chloride flush (NS) 0.9 % injection 10 mL, 10 mL, Intravenous, PRN, Volanda Napoleon, MD, 10 mL at 10/21/19  0912 Labs CBC    Component Value Date/Time   WBC 9.3 12/03/2020 0522   RBC 3.11 (L) 12/03/2020 0522   HGB 10.3 (L) 12/03/2020 0522   HGB 8.5 (L) 11/24/2020 0830   HGB 16.1 01/30/2017 0804   HCT 30.3 (L) 12/03/2020 0522   HCT 44.9 01/30/2017 0804   PLT 106 (L) 12/03/2020 0522   PLT 97 (L) 11/24/2020 0830   PLT 186 01/30/2017 0804   MCV 97.4 12/03/2020 0522   MCV 101 (H) 01/30/2017 0804   MCH 33.1 12/03/2020 0522   MCHC 34.0 12/03/2020 0522   RDW 18.5 (H) 12/03/2020 0522   RDW 11.8 01/30/2017 0804   LYMPHSABS 2.1 12/01/2020 1050   LYMPHSABS 1.8 01/30/2017 0804   MONOABS 1.0 12/01/2020 1050   EOSABS 0.1 12/01/2020 1050   EOSABS 0.1 01/30/2017 0804   BASOSABS 0.0 12/01/2020 1050   BASOSABS 0.0 01/30/2017 0804    CMP     Component Value Date/Time   NA 135 12/03/2020 0522   NA 142 01/30/2017 0804   K 4.1 12/03/2020 0522   K 4.1 01/30/2017 0804   CL 104 12/03/2020 0522   CL 105 01/25/2016 0926   CO2 25 12/03/2020 0522   CO2 29 01/30/2017 0804   GLUCOSE 123 (H) 12/03/2020 0522   GLUCOSE 105 01/30/2017 0804   GLUCOSE 113 01/25/2016 0926   BUN 16 12/03/2020 0522   BUN 6.9 (L) 01/30/2017 0804   CREATININE 1.60 (H) 12/03/2020 0522   CREATININE 1.53 (H) 11/24/2020 0830   CREATININE 0.8 01/30/2017 0804   CALCIUM 8.0 (L) 12/03/2020 0522   CALCIUM 10.5 (H) 01/30/2017 0804   PROT 5.8 (L) 12/01/2020 1050   PROT 7.9 01/30/2017 0804   ALBUMIN 3.4 (L) 12/01/2020 1050   ALBUMIN 4.5 01/30/2017 0804   AST 19 12/01/2020 1050   AST 13 (L) 11/24/2020 0830   AST 34 01/30/2017 0804   ALT 11 12/01/2020 1050   ALT 7 11/24/2020 0830   ALT 18 01/30/2017 0804   ALKPHOS 77 12/01/2020 1050   ALKPHOS 79 01/30/2017 0804   BILITOT 0.6 12/01/2020 1050   BILITOT 0.4 11/24/2020 0830   BILITOT 1.49 (H) 01/30/2017 0804   GFRNONAA 46 (L) 12/03/2020 0522   GFRNONAA 48 (L) 11/24/2020 0830   GFRAA >60 03/02/2020 1135    Imaging I have reviewed images in epic and the results pertinent to this  consultation are: No new imaging to review  Assessment: 70 year old with past medical history of hypertension, malignant melanoma of left lower leg, small cell lung cancer with metastasis to the liver and the brain, presented as a code stroke but was deemed to possibly be having seizures. Imaging negative for stroke but revealed new metastases-1 in the brainstem and 1 in the left temporal lobe with resolution of the prior seen metastases. EEG concerning for left hemispheric dysfunction with lateralized rhythmic delta activity in the left hemisphere, which improved with antiepileptic treatment. Clinical exam improving. Likely prolonged postictal in the setting of poor brain reserve. Comparison of MRIs from 2022 now shows advanced progression of leukoencephalopathy, in addition to  the metastasis.  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 Continue Dilantin 100 3 times daily Check Dilantin level in a week Continue IV dexamethasone 4 mg every 6 hours. Change medications to p.o. one-to-one conversion when able to take p.o. Continue involvement with palliative care-not very amenable to any goals of care discussions at this time but this should be an ongoing conversation as an outpatient. Maintain seizure precautions Plan discussed with Dr. Posey Pronto at bedside.  Plan also discussed with the patient's wife Kennyth Lose at bedside.  -- Amie Portland, MD Neurologist [facility] Pager: [phone]

## 2020-12-04 NOTE — Consult Note (Addendum)
[place]  [phone] Fax:([phone] [place]  Reason for Consultation: Extensive stage small cell lung cancer, brain metastasis  HPI: Mr. 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  The patient presented to the emergency department by EMS due to concern for seizure.  He was found to have status epilepticus on admission was intubated.  He has been seen by neurology and is currently on Keppra and also started on Dilantin.  Long-term EEG did not show any ongoing seizures.  MRI of the brain with and without contrast was performed 6/28 and did not show any acute or subacute infarct.  There was enlargement of a prior punctate focus of enhancement in the left temporal lobe which is now measuring 6 x 7 mm and favored to represent a met.  It appears that neurology discussed with neuro-oncology and he was felt not to be a good candidate for further radiation therapy.  Most recently, the patient received chemotherapy with cisplatin and Irinotecan.  Last cycle was given 6/2.  Subsequent chemotherapy cycles have been held secondary to diarrhea.  It appears that hospice was discussed with the patient and his wife at his visit on 6/8 but they wish to hold off on hospice at this time but were agreeable with palliative care.  The patient and his wife have had several meetings with the palliative care team this admission.  The patient was seen today in his hospital room.  His wife is at the bedside.  His wife reports that he is more awake and interactive today.  The patient did awaken for me and was able to answer some questions.  He is overall quite groggy though.  He reports a headache today.  He was given Tylenol prior to my visit.  He is not complaining of any  other pain today.  He has not had any recurrent seizure activity.  Wife reports persistent diarrhea which has been ongoing since receiving Irinotecan.  Medical oncology was asked see the patient to make recommendations regarding his extensive stage small cell lung cancer.   Past Medical History:  Diagnosis Date   Cancer [facility])    recently discovered melanoma   GERD (gastroesophageal reflux disease)    Goals of care, counseling/discussion 06/19/2019   History of hiatal hernia    Hypertension    recently placed on new blood pressure med   Malignant melanoma of skin of left lower leg ([place]) 07/28/2015  :   Past Surgical History:  Procedure Laterality Date   BRONCHIAL NEEDLE ASPIRATION BIOPSY  06/05/2019   Procedure: Bronchial Needle Aspiration Biopsies;  Surgeon: Candee Furbish, MD;  Location: [place];  Service: Thoracic;;   CARDIAC CATHETERIZATION     15 yrs ago...came out normal   -- hiatal hernia   COLONOSCOPY  2012   Dr Earlean Shawl    ENDOBRONCHIAL ULTRASOUND N/A 06/11/2019   Procedure: ENDOBRONCHIAL ULTRASOUND;  Surgeon: Garner Nash, DO;  Location: [place];  Service: Endoscopy;  Laterality: N/A;   ESOPHAGOGASTRODUODENOSCOPY  2012   Dr Earlean Shawl. has a really bad hiatal hernia    FINE NEEDLE ASPIRATION  06/11/2019   Procedure: FINE NEEDLE ASPIRATION (FNA) LINEAR;  Surgeon: Garner Nash, DO;  Location: [place];  Service: Endoscopy;;   IR IMAGING GUIDED PORT  INSERTION  07/03/2019   MELANOMA EXCISION WITH SENTINEL LYMPH NODE BIOPSY Left 09/29/2014   Procedure: WIDE EXCISION MELANOMA LEFT CALF WITH LEFT INGUINAL  SENTINEL LYMPH NODE BIOPSY;  Surgeon: Georganna Skeans, MD;  Location: [place];  Service: General;  Laterality: Left;   skin lesion excised     non cancerous   VIDEO BRONCHOSCOPY N/A 06/11/2019   Procedure: VIDEO BRONCHOSCOPY WITHOUT FLUORO;  Surgeon: Garner Nash, DO;  Location: [place];  Service: Endoscopy;  Laterality: N/A;   VIDEO BRONCHOSCOPY WITH ENDOBRONCHIAL  ULTRASOUND N/A 06/05/2019   Procedure: VIDEO BRONCHOSCOPY WITH ENDOBRONCHIAL ULTRASOUND;  Surgeon: Candee Furbish, MD;  Location: [place];  Service: Thoracic;  Laterality: N/A;  :   Current Facility-Administered Medications  Medication Dose Route Frequency Provider Last Rate Last Admin   acetaminophen (TYLENOL) tablet 650 mg  650 mg Per Tube Q6H PRN Lavina Hamman, MD   650 mg at 12/04/20 1203   chlorhexidine (PERIDEX) 0.12 % solution 15 mL  15 mL Mouth Rinse BID Jacky Kindle, MD   15 mL at 12/04/20 0936   Chlorhexidine Gluconate Cloth 2 % PADS 6 each  6 each Topical Daily Jacky Kindle, MD   6 each at 12/04/20 1230   dexamethasone (DECADRON) injection 4 mg  4 mg Intravenous Q6H Amie Portland, MD   4 mg at 12/04/20 1202   docusate sodium (COLACE) capsule 100 mg  100 mg Oral BID PRN [place] Cotta, NP       enoxaparin (LOVENOX) injection 40 mg  40 mg Subcutaneous Q24H Jacky Kindle, MD   40 mg at 12/04/20 0936   feeding supplement (OSMOLITE 1.5 CAL) liquid 1,000 mL  1,000 mL Per Tube Continuous Jacky Kindle, MD 55 mL/hr at 12/03/20 1844 1,000 mL at 12/03/20 1844   feeding supplement (PROSource TF) liquid 45 mL  45 mL Per Tube TID Jacky Kindle, MD   45 mL at 12/04/20 0936   levETIRAcetam (KEPPRA) IVPB 500 mg/100 mL premix  500 mg Intravenous Q12H Amie Portland, MD   Stopped at 12/04/20 0950   MEDLINE mouth rinse  15 mL Mouth Rinse q12n4p Jacky Kindle, MD   15 mL at 12/04/20 1203   multivitamin with minerals tablet 1 tablet  1 tablet Per Tube Daily Jacky Kindle, MD   1 tablet at 12/04/20 0936   mupirocin ointment (BACTROBAN) 2 % 1 application  1 application Nasal BID Jacky Kindle, MD   1 application at 12/45/80 0941   pantoprazole (PROTONIX) injection 40 mg  40 mg Intravenous QHS Estill Cotta, NP   40 mg at 12/03/20 2127   phenytoin (DILANTIN) injection 100 mg  100 mg Intravenous Q8H Anibal Henderson W, NP   100 mg at 12/04/20 9983   polyethylene glycol (MIRALAX / GLYCOLAX) packet 17 g   17 g Oral Daily PRN [place] Cotta, NP       sodium chloride flush (NS) 0.9 % injection 10-40 mL  10-40 mL Intracatheter Q12H Jacky Kindle, MD   10 mL at 12/04/20 1240   sodium chloride flush (NS) 0.9 % injection 10-40 mL  10-40 mL Intracatheter PRN Jacky Kindle, MD       Facility-Administered Medications Ordered in Other Encounters  Medication Dose Route Frequency Provider Last Rate Last Admin   sodium chloride flush (NS) 0.9 % injection 10 mL  10 mL Intravenous PRN Volanda Napoleon, MD   10 mL at 10/21/19 0912      Allergies  Allergen Reactions   Other  Itching and Other (See Comments)    Seasonal allergies: Sinus headaches, sneezing, itchy eyes  :   Family History  Problem Relation Age of Onset   Colon cancer Neg Hx    Esophageal cancer Neg Hx   :   Social History   Socioeconomic History   Marital status: Married    Spouse name: Not on file   Number of children: Not on file   Years of education: Not on file   Highest education level: Not on file  Occupational History   Not on file  Tobacco Use   Smoking status: Former    Packs/day: 1.00    Years: 50.00    Pack years: 50.00    Types: Cigarettes   Smokeless tobacco: Never   Tobacco comments:    quit  06/03/2019  Vaping Use   Vaping Use: Never used  Substance and Sexual Activity   Alcohol use: Not Currently    Alcohol/week: 0.0 standard drinks    Comment: socially....beer  wine & liquor   Drug use: No   Sexual activity: Not Currently  Other Topics Concern   Not on file  Social History Narrative   Not on file   Social Determinants of Health   Financial Resource Strain: Not on file  Food Insecurity: Not on file  Transportation Needs: Not on file  Physical Activity: Not on file  Stress: Not on file  Social Connections: Not on file  Intimate Partner Violence: Not on file  :  Review of Systems: Unable to obtain comprehensive review of systems secondary to patient lethargy.  Exam: Patient Vitals  for the past 24 hrs:  BP Temp Temp src Pulse Resp SpO2 Weight  12/04/20 1230 -- -- -- -- -- -- 65.8 kg  12/04/20 1200 132/88 98.7 F (37.1 C) Axillary 92 (!) 29 99 % --  12/04/20 0900 128/76 -- -- 82 13 100 % --  12/04/20 0800 139/77 98.6 F (37 C) Oral 84 12 100 % --  12/04/20 0600 137/62 -- -- 86 13 100 % --  12/04/20 0500 (!) 145/84 -- -- 85 12 100 % --  12/04/20 0400 139/83 98.4 F (36.9 C) Oral 84 16 100 % --  12/04/20 0300 128/74 -- -- 82 15 100 % --  12/04/20 0200 136/74 -- -- 79 19 100 % --  12/04/20 0100 -- -- -- 80 19 100 % --  12/04/20 0000 (!) 142/73 99.1 F (37.3 C) Axillary 81 18 100 % --  12/03/20 2300 110/72 -- -- 88 (!) 23 100 % --  12/03/20 2200 119/72 -- -- 82 18 100 % --  12/03/20 2100 (!) 153/80 -- -- 88 18 100 % --  12/03/20 2000 (!) 157/86 99.2 F (37.3 C) Oral 95 (!) 21 100 % --  12/03/20 1900 134/76 -- -- 90 (!) 24 100 % --  12/03/20 1800 115/79 -- -- 96 18 100 % --  12/03/20 1700 117/69 -- -- 92 20 100 % --  12/03/20 1600 114/64 99.6 F (37.6 C) Axillary 92 (!) 22 100 % --  12/03/20 1500 126/66 -- -- 91 (!) 23 100 % --  12/03/20 1400 115/68 -- -- 88 (!) 21 100 % --    General: Resting in bed quietly, no distress, opens eyes and can answer simple questions.   Eyes:  no scleral icterus.   ENT:  There were no oropharyngeal lesions.    Respiratory: lungs were clear bilaterally without wheezing or crackles.   Cardiovascular:  Regular rate and rhythm, S1/S2, without murmur, rub or gallop.  There was no pedal edema.   GI:  abdomen was soft, flat, nontender, nondistended, without organomegaly.    Skin: Scattered ecchymoses.   Neuro: Alert, oriented to place and person.   Lab Results  Component Value Date   WBC 10.9 (H) 12/04/2020   HGB 9.9 (L) 12/04/2020   HCT 29.8 (L) 12/04/2020   PLT 100 (L) 12/04/2020   GLUCOSE 176 (H) 12/04/2020   ALT 8 12/04/2020   AST 11 (L) 12/04/2020   NA 135 12/04/2020   K 4.5 12/04/2020   CL 104 12/04/2020   CREATININE  1.32 (H) 12/04/2020   BUN 20 12/04/2020   CO2 26 12/04/2020    DG Chest 2 View  Result Date: 11/09/2020 CLINICAL DATA:  Weakness and dizziness.  Small cell lung cancer. EXAM: CHEST - 2 VIEW COMPARISON:  PET-CT 10/19/2020 FINDINGS: Power injectable Port-A-Cath tip: SVC. Symmetric nodularity at the lung bases ascribed nipple shadows. Thoracic spondylosis. Heart size within normal limits. Atherosclerotic calcification of the aortic arch. No blunting of the costophrenic angles. IMPRESSION: 1.  No active cardiopulmonary disease is radiographically apparent. 2.  Aortic Atherosclerosis (ICD10-I70.0). 3. Thoracic spondylosis. Electronically Signed   By: Van Clines M.D.   On: 11/09/2020 17:42   CT Head Wo Contrast  Result Date: 11/09/2020 CLINICAL DATA:  Generalized weakness for 1 month with dizziness. Increased urination at night. No reported injury. History of melanoma metastatic to the brain. EXAM: CT HEAD WITHOUT CONTRAST TECHNIQUE: Contiguous axial images were obtained from the base of the skull through the vertex without intravenous contrast. COMPARISON:  09/05/2020 head CT. FINDINGS: Brain: Generalized cerebral volume loss. Nonspecific prominent subcortical and periventricular white matter hypodensity, most in keeping with chronic small vessel ischemic change. No evidence of parenchymal hemorrhage or extra-axial fluid collection. No mass lesion, mass effect, or midline shift. No CT evidence of acute infarction. No ventriculomegaly. Vascular: No acute abnormality. Skull: No evidence of calvarial fracture. Sinuses/Orbits: No fluid levels. Mild patchy opacification of the ethmoidal air cells. Other:  The mastoid air cells are unopacified. IMPRESSION: 1. No evidence of acute intracranial abnormality. 2. Generalized cerebral volume loss and prominent chronic small vessel ischemic changes in the cerebral white matter. 3. Two known brain metastases seen on 09/21/2020 MRI are occult by today's noncontrast CT.  Electronically Signed   By: Ilona Sorrel M.D.   On: 11/09/2020 17:37   MR BRAIN W WO CONTRAST  Result Date: 12/01/2020 CLINICAL DATA:  Stroke versus seizure presentation patient being treated for metastatic lung cancer. EXAM: MRI HEAD WITHOUT AND WITH CONTRAST TECHNIQUE: Multiplanar, multiecho pulse sequences of the brain and surrounding structures were obtained without and with intravenous contrast. CONTRAST:  109m GADAVIST GADOBUTROL 1 MMOL/ML IV SOLN COMPARISON:  CT studies earlier same day. MRI 09/21/2020. multiple prior MRI studies as distant as 06/03/2019. FINDINGS: Brain: Today's study is considerably degraded by motion. There is recurrent edema seen within the right mid brain and medial thalamus, which had resolved on the study of April 18. There is some linear enhancement in the right mid brain. No enhancement in the medial thalamus. These findings could represent some combination of treatment effect and possibly recurrent disease in the mid brain. There is enlargement of an enhancing focus within the left temporal lobe just inferior and lateral to the temporal horn of the lateral ventricle. This was present as a punctate focus of enhancement on the study of 2 months ago but today measures 6  x 7 mm. This is most likely a metastatic focus. No other enhancing foci are evident. Multiple previously seen metastatic foci no longer show mass effect or enhancement. Strikingly, enhancement at the left posterior frontal vertex along the cortical surfaces and within the subinsular white matter on the right have completely resolved. Widespread T2 and FLAIR signal throughout the white matter is largely similar to the previous study otherwise. No evidence of hemorrhage or hydrocephalus. No restricted diffusion to suggest acute or subacute infarction. Vascular: Major vessels at the base of the brain show flow. Skull and upper cervical spine: Negative Sinuses/Orbits: Clear/normal Other: None IMPRESSION: Diffusion  imaging does not show any acute or subacute infarction. Resolution of abnormal enhancement seen on the study of 2 months ago within the subinsular white matter on the right and along the cortical surface of the brain at the left posterior frontal vertex. Enlargement of what was a punctate focus of enhancement in the left temporal lobe, now measuring 6 x 7 mm today, favored to represent a metastasis. Recurrent linear enhancement in the mid brain on the right. Recurrent edema in the right mid brain and medial thalamus. No enhancement in the medial thalamus. These areas had normalized on the study 09/21/2020, were normal on the study of 04/09/2020 but showed striking enhancement on the study of 07/10/2020. Therefore, I wonder if these areas represent waxing and waning treatment effect. Electronically Signed   By: Nelson Chimes M.D.   On: 12/01/2020 14:40   DG CHEST PORT 1 VIEW  Result Date: 12/01/2020 CLINICAL DATA:  Endotracheal tube placement. EXAM: PORTABLE CHEST 1 VIEW COMPARISON:  November 09, 2020. FINDINGS: The heart size and mediastinal contours are within normal limits. Both lungs are clear. Endotracheal tube is in grossly good position. Right internal jugular Port-A-Cath is unchanged. The visualized skeletal structures are unremarkable. IMPRESSION: Endotracheal tube in grossly good position. Electronically Signed   By: Marijo Conception M.D.   On: 12/01/2020 18:07   DG Abd Portable 1V  Result Date: 12/02/2020 CLINICAL DATA:  Check feeding catheter placement EXAM: PORTABLE ABDOMEN - 1 VIEW COMPARISON:  None. FINDINGS: Feeding catheter is noted in the distal stomach directed towards the pylorus. Scattered large and small bowel gas is noted. No other focal abnormality is seen. IMPRESSION: Feeding catheter in the distal stomach. Electronically Signed   By: Inez Catalina M.D.   On: 12/02/2020 13:31   EEG adult  Result Date: 12/01/2020 Lora Havens, MD     12/01/2020 12:13 PM Patient Name: Jaimes Eckert MRN:  [id number] Epilepsy Attending: Lora Havens Referring Physician/Provider: Anibal Henderson, NP Date: 12/01/2020 Duration: 22.02 mins Patient history: 70 year old male presented with acute onset aphasia and left-sided weakness.  EEG to evaluate for seizures. Level of alertness: Awake AEDs during EEG study: Keppra, Ativan Technical aspects: This EEG study was done with scalp electrodes positioned according to the 10-20 International system of electrode placement. Electrical activity was acquired at a sampling rate of _0  and reviewed with a high frequency filter of _1  and a low frequency filter of _2 . EEG data were recorded continuously and digitally stored. Description: No posterior dominant rhythm was seen.  EEG showed continuous low amplitude 2 to 3 Hz delta slowing in left hemisphere as well as 5 to 6 Hz theta slowing in right hemisphere.  Hyperventilation and photic stimulation were not performed.   ABNORMALITY -Lateralized rhythmic delta activity, left hemisphere -Continuous slow, right hemisphere IMPRESSION: This study showed lateralized rhythmic delta activity in the left hemisphere  which can be seen due to underlying structural abnormality, postictal state.  Of note, lateralized rhythmic delta activity is also on the ictal-interictal continuum with low potential for seizures.  Additionally EEG suggestive of moderate diffuse encephalopathy, nonspecific etiology but most likely due to medications, postictal state.  No definite seizures were seen during this time. Dr. Rory Percy was notified. Priyanka Barbra Sarks   Overnight EEG with video  Result Date: 12/02/2020 Lora Havens, MD     12/03/2020  9:21 AM Patient Name: Waddell Iten MRN: [id number] Epilepsy Attending: Lora Havens Referring Physician/Provider: Dr Amie Portland Duration: 12/01/2020 1214 to 12/02/2020 1403  Patient history: 70 year old male presented with acute onset aphasia and left-sided weakness.  EEG to evaluate for seizures.  Level of  alertness: Awake, asleep  AEDs during EEG study: Keppra, PHT  Technical aspects: This EEG study was done with scalp electrodes positioned according to the 10-20 International system of electrode placement. Electrical activity was acquired at a sampling rate of _0  and reviewed with a high frequency filter of _1  and a low frequency filter of _2 . EEG data were recorded continuously and digitally stored.  Description: No posterior dominant rhythm was seen.  Sleep was characterized by sleep spindles (12 to 14 Hz), maximal frontocentral region.  EEG showed continuous generalized 5 to 6 Hz theta slowing. Intermittent 2 to 3 Hz rhythmic delta slowing was also noted in left hemisphere which at times appeared sharply contoured. Hyperventilation and photic stimulation were not performed.    ABNORMALITY -Lateralized rhythmic delta activity, left hemisphere -Continuous slow, generalized  IMPRESSION: This study showed lateralized rhythmic delta activity in the left hemisphere which can be seen due to underlying structural abnormality, postictal state.  Of note, lateralized rhythmic delta activity is also on the ictal-interictal continuum with low potential for seizures. Additionally EEG suggestive of moderate diffuse encephalopathy, nonspecific etiology but most likely due to medications, postictal state.  No definite seizures were seen during this time.  Lora Havens   CT HEAD CODE STROKE WO CONTRAST  Result Date: 12/01/2020 CLINICAL DATA:  Code stroke. Acute neuro deficit, stroke suspected. History of metastatic melanoma. EXAM: CT HEAD WITHOUT CONTRAST TECHNIQUE: Contiguous axial images were obtained from the base of the skull through the vertex without intravenous contrast. COMPARISON:  Head CT 11/09/2020 and MRI 09/21/2020 FINDINGS: Brain: There is no evidence of an acute large territory infarct, intracranial hemorrhage, extra-axial fluid collection or midline shift/other significant mass effect. Brain  metastases demonstrated on the prior MRI are largely occult by CT. Focal cortical hypodensity/encephalomalacia in the high posterior right frontal lobe is unchanged and corresponds to the location of a previously treated lesion. Confluent hypodensities throughout the cerebral white matter bilaterally are unchanged and compatible with post radiation changes superimposed on chronic small vessel ischemia. There is moderate cerebral atrophy. Vascular: No fracture or suspicious osseous crash that calcified atherosclerosis at the skull base. No hyperdense vessel. Skull: No fracture or suspicious osseous lesion. Sinuses/Orbits: Mild mucosal thickening in the ethmoid sinuses. Clear mastoid air cells. Unremarkable orbits. Other: None. ASPECTS [facility] Stroke Program Early CT Score) - Ganglionic level infarction (caudate, lentiform nuclei, internal capsule, insula, M1-M3 cortex): 7 - Supraganglionic infarction (M4-M6 cortex): 3 Total score (0-10 with 10 being normal): 10 IMPRESSION: 1. No evidence of acute intracranial abnormality. 2. ASPECTS is 10. 3. Post radiation changes in the cerebral white matter. These results were communicated to Dr. Rory Percy at 11:07 am on 12/01/2020 by text page via the [facility] messaging system. Electronically Signed  By: Logan Bores M.D.   On: 12/01/2020 11:07   CT ANGIO HEAD NECK W WO CM (CODE STROKE)  Result Date: 12/01/2020 CLINICAL DATA:  Neurological deficit.  Acute stroke suspected. EXAM: CT ANGIOGRAPHY HEAD AND NECK TECHNIQUE: Multidetector CT imaging of the head and neck was performed using the standard protocol during bolus administration of intravenous contrast. Multiplanar CT image reconstructions and MIPs were obtained to evaluate the vascular anatomy. Carotid stenosis measurements (when applicable) are obtained utilizing NASCET criteria, using the distal internal carotid diameter as the denominator. CONTRAST:  18m OMNIPAQUE IOHEXOL 350 MG/ML SOLN COMPARISON:  Head CT earlier same  day. FINDINGS: CTA NECK FINDINGS Aortic arch: Aortic atherosclerosis. Branching pattern is normal without origin stenosis. Right carotid system: Common carotid artery widely patent to the bifurcation. Calcified plaque at the carotid bifurcation and ICA bulb but no stenosis compared to the more distal cervical ICA diameter. Left carotid system: Common carotid artery widely patent to the bifurcation. Calcified plaque at the carotid bifurcation and ICA bulb. No stenosis compared to the diameter of the more distal cervical ICA. Vertebral arteries: Left vertebral artery origin is normal. Right vertebral artery origin shows some adjacent calcification but no stenosis. Both vertebral arteries widely patent through the cervical region to the foramen magnum. Skeleton: Ordinary cervical spondylosis. Other neck: No mass or lymphadenopathy. Upper chest: Mild emphysema and pulmonary scarring. Review of the MIP images confirms the above findings CTA HEAD FINDINGS Anterior circulation: Both internal carotid arteries patent through the skull base and siphon regions. Minimal atherosclerotic calcification in the carotid siphon regions but no stenosis. The anterior and middle cerebral vessels are patent. No acute large vessel occlusion or correctable proximal stenosis. Posterior circulation: Both vertebral arteries widely patent through the foramen magnum to the basilar. No basilar stenosis. Posterior circulation branch vessels appear normal. Venous sinuses: Patent and normal. Anatomic variants: None significant. Review of the MIP images confirms the above findings IMPRESSION: No acute intracranial large or medium vessel occlusion. No correctable proximal stenosis. Atherosclerotic calcification at both carotid bifurcations but without stenosis. These results were communicated to Dr. ARory Percyat 1[place]6/28/2022by text page via the [facility] system. Electronically Signed   By: MNelson ChimesM.D.   On: 12/01/2020 11:11     DG  Chest 2 View  Result Date: 11/09/2020 CLINICAL DATA:  Weakness and dizziness.  Small cell lung cancer. EXAM: CHEST - 2 VIEW COMPARISON:  PET-CT 10/19/2020 FINDINGS: Power injectable Port-A-Cath tip: SVC. Symmetric nodularity at the lung bases ascribed nipple shadows. Thoracic spondylosis. Heart size within normal limits. Atherosclerotic calcification of the aortic arch. No blunting of the costophrenic angles. IMPRESSION: 1.  No active cardiopulmonary disease is radiographically apparent. 2.  Aortic Atherosclerosis (ICD10-I70.0). 3. Thoracic spondylosis. Electronically Signed   By: WVan ClinesM.D.   On: 11/09/2020 17:42   CT Head Wo Contrast  Result Date: 11/09/2020 CLINICAL DATA:  Generalized weakness for 1 month with dizziness. Increased urination at night. No reported injury. History of melanoma metastatic to the brain. EXAM: CT HEAD WITHOUT CONTRAST TECHNIQUE: Contiguous axial images were obtained from the base of the skull through the vertex without intravenous contrast. COMPARISON:  09/05/2020 head CT. FINDINGS: Brain: Generalized cerebral volume loss. Nonspecific prominent subcortical and periventricular white matter hypodensity, most in keeping with chronic small vessel ischemic change. No evidence of parenchymal hemorrhage or extra-axial fluid collection. No mass lesion, mass effect, or midline shift. No CT evidence of acute infarction. No ventriculomegaly. Vascular: No acute abnormality. Skull: No  evidence of calvarial fracture. Sinuses/Orbits: No fluid levels. Mild patchy opacification of the ethmoidal air cells. Other:  The mastoid air cells are unopacified. IMPRESSION: 1. No evidence of acute intracranial abnormality. 2. Generalized cerebral volume loss and prominent chronic small vessel ischemic changes in the cerebral white matter. 3. Two known brain metastases seen on 09/21/2020 MRI are occult by today's noncontrast CT. Electronically Signed   By: Ilona Sorrel M.D.   On: 11/09/2020 17:37    MR BRAIN W WO CONTRAST  Result Date: 12/01/2020 CLINICAL DATA:  Stroke versus seizure presentation patient being treated for metastatic lung cancer. EXAM: MRI HEAD WITHOUT AND WITH CONTRAST TECHNIQUE: Multiplanar, multiecho pulse sequences of the brain and surrounding structures were obtained without and with intravenous contrast. CONTRAST:  1m GADAVIST GADOBUTROL 1 MMOL/ML IV SOLN COMPARISON:  CT studies earlier same day. MRI 09/21/2020. multiple prior MRI studies as distant as 06/03/2019. FINDINGS: Brain: Today's study is considerably degraded by motion. There is recurrent edema seen within the right mid brain and medial thalamus, which had resolved on the study of April 18. There is some linear enhancement in the right mid brain. No enhancement in the medial thalamus. These findings could represent some combination of treatment effect and possibly recurrent disease in the mid brain. There is enlargement of an enhancing focus within the left temporal lobe just inferior and lateral to the temporal horn of the lateral ventricle. This was present as a punctate focus of enhancement on the study of 2 months ago but today measures 6 x 7 mm. This is most likely a metastatic focus. No other enhancing foci are evident. Multiple previously seen metastatic foci no longer show mass effect or enhancement. Strikingly, enhancement at the left posterior frontal vertex along the cortical surfaces and within the subinsular white matter on the right have completely resolved. Widespread T2 and FLAIR signal throughout the white matter is largely similar to the previous study otherwise. No evidence of hemorrhage or hydrocephalus. No restricted diffusion to suggest acute or subacute infarction. Vascular: Major vessels at the base of the brain show flow. Skull and upper cervical spine: Negative Sinuses/Orbits: Clear/normal Other: None IMPRESSION: Diffusion imaging does not show any acute or subacute infarction. Resolution of  abnormal enhancement seen on the study of 2 months ago within the subinsular white matter on the right and along the cortical surface of the brain at the left posterior frontal vertex. Enlargement of what was a punctate focus of enhancement in the left temporal lobe, now measuring 6 x 7 mm today, favored to represent a metastasis. Recurrent linear enhancement in the mid brain on the right. Recurrent edema in the right mid brain and medial thalamus. No enhancement in the medial thalamus. These areas had normalized on the study 09/21/2020, were normal on the study of 04/09/2020 but showed striking enhancement on the study of 07/10/2020. Therefore, I wonder if these areas represent waxing and waning treatment effect. Electronically Signed   By: MNelson ChimesM.D.   On: 12/01/2020 14:40   DG CHEST PORT 1 VIEW  Result Date: 12/01/2020 CLINICAL DATA:  Endotracheal tube placement. EXAM: PORTABLE CHEST 1 VIEW COMPARISON:  November 09, 2020. FINDINGS: The heart size and mediastinal contours are within normal limits. Both lungs are clear. Endotracheal tube is in grossly good position. Right internal jugular Port-A-Cath is unchanged. The visualized skeletal structures are unremarkable. IMPRESSION: Endotracheal tube in grossly good position. Electronically Signed   By: JMarijo ConceptionM.D.   On: 12/01/2020 18:07  DG Abd Portable 1V  Result Date: 12/02/2020 CLINICAL DATA:  Check feeding catheter placement EXAM: PORTABLE ABDOMEN - 1 VIEW COMPARISON:  None. FINDINGS: Feeding catheter is noted in the distal stomach directed towards the pylorus. Scattered large and small bowel gas is noted. No other focal abnormality is seen. IMPRESSION: Feeding catheter in the distal stomach. Electronically Signed   By: Inez Catalina M.D.   On: 12/02/2020 13:31   EEG adult  Result Date: 12/01/2020 Lora Havens, MD     12/01/2020 12:13 PM Patient Name: Jaiel Saraceno MRN: [id number] Epilepsy Attending: Lora Havens Referring  Physician/Provider: Anibal Henderson, NP Date: 12/01/2020 Duration: 22.02 mins Patient history: 70 year old male presented with acute onset aphasia and left-sided weakness.  EEG to evaluate for seizures. Level of alertness: Awake AEDs during EEG study: Keppra, Ativan Technical aspects: This EEG study was done with scalp electrodes positioned according to the 10-20 International system of electrode placement. Electrical activity was acquired at a sampling rate of _0  and reviewed with a high frequency filter of _1  and a low frequency filter of _2 . EEG data were recorded continuously and digitally stored. Description: No posterior dominant rhythm was seen.  EEG showed continuous low amplitude 2 to 3 Hz delta slowing in left hemisphere as well as 5 to 6 Hz theta slowing in right hemisphere.  Hyperventilation and photic stimulation were not performed.   ABNORMALITY -Lateralized rhythmic delta activity, left hemisphere -Continuous slow, right hemisphere IMPRESSION: This study showed lateralized rhythmic delta activity in the left hemisphere which can be seen due to underlying structural abnormality, postictal state.  Of note, lateralized rhythmic delta activity is also on the ictal-interictal continuum with low potential for seizures.  Additionally EEG suggestive of moderate diffuse encephalopathy, nonspecific etiology but most likely due to medications, postictal state.  No definite seizures were seen during this time. Dr. Rory Percy was notified. Priyanka Barbra Sarks   Overnight EEG with video  Result Date: 12/02/2020 Lora Havens, MD     12/03/2020  9:21 AM Patient Name: Treven Holtman MRN: [id number] Epilepsy Attending: Lora Havens Referring Physician/Provider: Dr Amie Portland Duration: 12/01/2020 1214 to 12/02/2020 1403  Patient history: 70 year old male presented with acute onset aphasia and left-sided weakness.  EEG to evaluate for seizures.  Level of alertness: Awake, asleep  AEDs during EEG study: Keppra, PHT   Technical aspects: This EEG study was done with scalp electrodes positioned according to the 10-20 International system of electrode placement. Electrical activity was acquired at a sampling rate of _3  and reviewed with a high frequency filter of _4  and a low frequency filter of _5 . EEG data were recorded continuously and digitally stored.  Description: No posterior dominant rhythm was seen.  Sleep was characterized by sleep spindles (12 to 14 Hz), maximal frontocentral region.  EEG showed continuous generalized 5 to 6 Hz theta slowing. Intermittent 2 to 3 Hz rhythmic delta slowing was also noted in left hemisphere which at times appeared sharply contoured. Hyperventilation and photic stimulation were not performed.    ABNORMALITY -Lateralized rhythmic delta activity, left hemisphere -Continuous slow, generalized  IMPRESSION: This study showed lateralized rhythmic delta activity in the left hemisphere which can be seen due to underlying structural abnormality, postictal state.  Of note, lateralized rhythmic delta activity is also on the ictal-interictal continuum with low potential for seizures. Additionally EEG suggestive of moderate diffuse encephalopathy, nonspecific etiology but most likely due to medications, postictal state.  No definite seizures were seen during this time.  Priyanka  Barbra Sarks   CT HEAD CODE STROKE WO CONTRAST  Result Date: 12/01/2020 CLINICAL DATA:  Code stroke. Acute neuro deficit, stroke suspected. History of metastatic melanoma. EXAM: CT HEAD WITHOUT CONTRAST TECHNIQUE: Contiguous axial images were obtained from the base of the skull through the vertex without intravenous contrast. COMPARISON:  Head CT 11/09/2020 and MRI 09/21/2020 FINDINGS: Brain: There is no evidence of an acute large territory infarct, intracranial hemorrhage, extra-axial fluid collection or midline shift/other significant mass effect. Brain metastases demonstrated on the prior MRI are largely occult by CT.  Focal cortical hypodensity/encephalomalacia in the high posterior right frontal lobe is unchanged and corresponds to the location of a previously treated lesion. Confluent hypodensities throughout the cerebral white matter bilaterally are unchanged and compatible with post radiation changes superimposed on chronic small vessel ischemia. There is moderate cerebral atrophy. Vascular: No fracture or suspicious osseous crash that calcified atherosclerosis at the skull base. No hyperdense vessel. Skull: No fracture or suspicious osseous lesion. Sinuses/Orbits: Mild mucosal thickening in the ethmoid sinuses. Clear mastoid air cells. Unremarkable orbits. Other: None. ASPECTS [facility] Stroke Program Early CT Score) - Ganglionic level infarction (caudate, lentiform nuclei, internal capsule, insula, M1-M3 cortex): 7 - Supraganglionic infarction (M4-M6 cortex): 3 Total score (0-10 with 10 being normal): 10 IMPRESSION: 1. No evidence of acute intracranial abnormality. 2. ASPECTS is 10. 3. Post radiation changes in the cerebral white matter. These results were communicated to Dr. Rory Percy at 11:07 am on 12/01/2020 by text page via the [facility] messaging system. Electronically Signed   By: Logan Bores M.D.   On: 12/01/2020 11:07   CT ANGIO HEAD NECK W WO CM (CODE STROKE)  Result Date: 12/01/2020 CLINICAL DATA:  Neurological deficit.  Acute stroke suspected. EXAM: CT ANGIOGRAPHY HEAD AND NECK TECHNIQUE: Multidetector CT imaging of the head and neck was performed using the standard protocol during bolus administration of intravenous contrast. Multiplanar CT image reconstructions and MIPs were obtained to evaluate the vascular anatomy. Carotid stenosis measurements (when applicable) are obtained utilizing NASCET criteria, using the distal internal carotid diameter as the denominator. CONTRAST:  87m OMNIPAQUE IOHEXOL 350 MG/ML SOLN COMPARISON:  Head CT earlier same day. FINDINGS: CTA NECK FINDINGS Aortic arch: Aortic atherosclerosis.  Branching pattern is normal without origin stenosis. Right carotid system: Common carotid artery widely patent to the bifurcation. Calcified plaque at the carotid bifurcation and ICA bulb but no stenosis compared to the more distal cervical ICA diameter. Left carotid system: Common carotid artery widely patent to the bifurcation. Calcified plaque at the carotid bifurcation and ICA bulb. No stenosis compared to the diameter of the more distal cervical ICA. Vertebral arteries: Left vertebral artery origin is normal. Right vertebral artery origin shows some adjacent calcification but no stenosis. Both vertebral arteries widely patent through the cervical region to the foramen magnum. Skeleton: Ordinary cervical spondylosis. Other neck: No mass or lymphadenopathy. Upper chest: Mild emphysema and pulmonary scarring. Review of the MIP images confirms the above findings CTA HEAD FINDINGS Anterior circulation: Both internal carotid arteries patent through the skull base and siphon regions. Minimal atherosclerotic calcification in the carotid siphon regions but no stenosis. The anterior and middle cerebral vessels are patent. No acute large vessel occlusion or correctable proximal stenosis. Posterior circulation: Both vertebral arteries widely patent through the foramen magnum to the basilar. No basilar stenosis. Posterior circulation branch vessels appear normal. Venous sinuses: Patent and normal. Anatomic variants: None significant. Review of the MIP images confirms the above findings IMPRESSION: No acute intracranial large or  medium vessel occlusion. No correctable proximal stenosis. Atherosclerotic calcification at both carotid bifurcations but without stenosis. These results were communicated to Dr. Rory Percy at [place] 6/28/2022by text page via the [facility] messaging system. Electronically Signed   By: Nelson Chimes M.D.   On: 12/01/2020 11:11    Assessment and Plan:  1.  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  Acute metabolic encephalopathy 3.  Dysphagia 4.  Normocytic anemia 5.  Thrombocytopenia 6.  CKD 7.  History of stage Ib left lower extremity melanoma  -MRI brain results reviewed which shows some increase in size of his lesion in the left temporal lobe.  According to neuro-oncology, he is not a candidate for additional radiation. -Continue Keppra and Dilantin per neurology.  Continue dexamethasone but recommend switching to p.o. when he is able to take p.o.  We can consider titration at that time. -May be reasonable to consider restaging CT chest/abdomen/pelvis this admission for restaging purposes and to help with goals of care discussion.  Has not been able to tolerate chemotherapy well recently secondary to side effects. -Speech therapy evaluation pending. -Monitor hemoglobin and platelets closely.  No need for transfusion at this time.   Mikey Bussing, DNP, AGPCNP-BC, AOCNP   ADDENDUM: I agree with the above assessment.  I saw and examined Mr. Beier.  This is quite unfortunate.  His quality of life is really poor now.  He is not out of bed.  Really not sure how functional he will be.  It is no surprise that the recurrence in the brain is what is caused the problem now.  I agree that there is absolutely no indication for any further radiation to the brain.  He is not a chemotherapy candidate.  He has been through all the chemotherapy that we have for his metastatic small cell lung cancer.  He has had a very hard time with treatment.  In my opinion, this is the right time for Hospice.  However, his wife is not willing to take that neck step.  She still would like to have therapy work with him and try to get him more functional.  Again I just have a hard time believing that he is going to be all that more functional.  He has a feeding tube in.  I will check a prealbumin on him to see how that looks.  This could certainly be prognostic.  I suppose that speech  therapy will come in to see about him swallowing.  It is hard to say how long he will be in the hospital.  I know he has fought hard.  He has done well with his small cell lung cancer.  However, as it is with all of our patients with this disease, it becomes resilient to therapy and just progresses.  I would favor holding off on any scans on him to check for systemic disease.  No matter what we find, he is not going to be treated for this.  I know that he is getting fantastic care from all the staff up on [facility].  I appreciate all their hard work and compassion.  We will follow along.  Lattie Haw, MD  Hebrews 12:12

## 2020-12-04 NOTE — Progress Notes (Addendum)
[facility] Progress Note  Patient: Walter Hernandez    IHK:[id number]  DOA: 12/01/2020     Date of Service: the patient was seen and examined on 12/04/2020  Brief hospital course: Past medical history of small cell cancer with brain metastasis SP chemoradiation, HTN, malignant adenoma. Presented to hospital with confusion.  Found to have status epilepticus was intubated and was with PICU. Transfer to hospital service on 12/04/2020. Currently plan is monitor for improvement in mentation, toleration of the oral diet and follow-up on oncology recommendations.  Assessment and Plan: 1.  Status epilepticus Metastatic non-small cell cancer with brain metastasis. Acute metabolic encephalopathy Presented with confusion. Found to have status epilepticus. Neurology was consulted. Patient was admitted to the ICU. On Keppra 5 mg twice daily, dose increased to 1000 mg twice daily.  Long-term EEG monitoring did not show any ongoing seizures. Keppra dose reduced to 5 mg twice daily again. Patient was also started on Dilantin. Received 1 dose of Vimpat Mentation improving.  Patient is able to follow commands.  Able to answer questions. Will monitor speech therapy consultation regarding diet advancement.  He has NG tube. PT OT consulted as well. Transfer out of the ICU to telemetry unit.  2.  Stage Ib melanoma. Small cell lung cancer with metastasis. Discussed with oncology. We will follow-up on the patient. Neurology discussed with neuro oncology.  Not a good candidate for further radiation therapy. Patient has seen oncology outpatient. Patient for formal service has been worsening therefore not a good candidate for chemotherapy as well. Palliative care consulted for goals of care conversation.  3.  Dysphagia. Speech therapy consulted. Currently has NG tube. Follow-up on recommendation.  4.  Acute respiratory failure with hypoxia, present on admission. Required intubation on  admission. Secondary to seizures. Currently on room air. Monitor.  Chest x-ray negative for any acute abnormality.  5.  Hyponatremia, hypomagnesemia Currently corrected. Monitor.  6.  Acute kidney injury on CKD 3A. Renal function improved with IV hydration. Likely renal etiology for Monitor.  Avoid nephrotoxic medication.  7.  Anemia and thrombocytopenia secondary to chemotherapy as well as nutritional deficiency. Currently H&H and platelets are stable. Monitor.  8.  Goals of care conversation. Wife at bedside.  Was asking about whether any other therapy available to improve patient's demyelination or white matter disease. Explained about current standard of care. Explained about potential for recurrence of the patient with small cell cancer.  She verbalized understanding.  Palliative care continue to follow.  Ideally patient would benefit from DNR/DNI status.  Body mass index is 23.07 kg/m.  Nutrition Problem: Increased nutrient needs Etiology: cancer and cancer related treatments Interventions: Interventions: Tube feeding, Prostat, MVI      Diet: N.p.o. for now.  Speech therapy consulted.  Tube feedings on. DVT Prophylaxis:   enoxaparin (LOVENOX) injection 40 mg Start: 12/03/20 1000 SCDs Start: 12/01/20 1601    Advance goals of care discussion: Full code  Family Communication: family was present at bedside, at the time of interview.  The pt provided permission to discuss medical plan with the family. Opportunity was given to ask question and all questions were answered satisfactorily.   Disposition:  Status is: Inpatient  Remains inpatient appropriate because:IV treatments appropriate due to intensity of illness or inability to take PO and Inpatient level of care appropriate due to severity of illness  Dispo: The patient is from: Home              Anticipated d/c is to: SNF  Patient currently is not medically stable to d/c.   Difficult to place patient  No        Subjective: Alert.  Able to answer questions.  Able to follow commands.  Has left temporal head headache.  No nausea no vomiting.  No acute events overnight  Physical Exam:  General: Appear in mild distress, no Rash; Oral Mucosa Clear, moist. no Abnormal Neck Mass Or lumps, Conjunctiva normal  Cardiovascular: S1 and S2 Present, no Murmur, Respiratory: good respiratory effort, Bilateral Air entry present and CTA, no Crackles, no wheezes Abdomen: Bowel Sound present, Soft and no tenderness Extremities: no Pedal edema Neurology: alert and oriented to place and person affect appropriate.  Left upper extremity weakness, no motor deficits on lower extremity, no other new focal deficit Gait not checked due to patient safety concerns  Vitals:   12/04/20 0500 12/04/20 0600 12/04/20 0800 12/04/20 0900  BP: (!) 145/84 137/62 139/77 128/76  Pulse: 85 86 84 82  Resp: 12 13 12 13   Temp:   98.6 F (37 C)   TempSrc:   Oral   SpO2: 100% 100% 100% 100%  Weight:      Height:       Filed Weights   12/01/20 1131 12/01/20 1700 12/02/20 0500  Weight: 67 kg 66.8 kg 66.8 kg    Data Reviewed: I have personally reviewed and interpreted daily labs, tele strips, imaging. I reviewed all nursing notes, pharmacy notes, vitals, pertinent old records I have discussed plan of care as described above with RN and patient/family.  CBC: Recent Labs  Lab 12/01/20 1050 12/01/20 1053 12/01/20 1757 12/02/20 0534 12/03/20 0522  WBC 6.2  --   --  7.6 9.3  NEUTROABS 2.7  --   --   --   --   HGB 11.5* 10.5* 11.9* 10.6* 10.3*  HCT 33.0* 31.0* 35.0* 30.7* 30.3*  MCV 96.2  --   --  94.8 97.4  PLT 104*  --   --  103* 527*   Basic Metabolic Panel: Recent Labs  Lab 12/01/20 1050 12/01/20 1053 12/01/20 1757 12/02/20 0534 12/02/20 1625 12/03/20 0522 12/03/20 1855 12/04/20 0625 12/04/20 1030  NA 134* 134* 130* 133*  --  135  --   --  135  K 4.0 4.1 4.4 4.9  --  4.1  --   --  4.5  CL 97* 98   --  104  --  104  --   --  104  CO2 25  --   --  21*  --  25  --   --  26  GLUCOSE 120* 116*  --  144*  --  123*  --   --  176*  BUN 7* 7*  --  13  --  16  --   --  20  CREATININE 1.69* 1.60*  --  1.79*  --  1.60*  --   --  1.32*  CALCIUM 9.1  --   --  8.4*  --  8.0*  --   --  8.7*  MG  --   --   --  1.0* 2.2 1.8 2.1 1.9  --   PHOS  --   --   --  4.9* 3.6 3.9 3.6 3.1  --     Studies: No results found.  Scheduled Meds:  chlorhexidine  15 mL Mouth Rinse BID   Chlorhexidine Gluconate Cloth  6 each Topical Daily   dexamethasone (DECADRON) injection  4 mg Intravenous Q6H  enoxaparin (LOVENOX) injection  40 mg Subcutaneous Q24H   feeding supplement (PROSource TF)  45 mL Per Tube TID   mouth rinse  15 mL Mouth Rinse q12n4p   multivitamin with minerals  1 tablet Per Tube Daily   mupirocin ointment  1 application Nasal BID   pantoprazole (PROTONIX) IV  40 mg Intravenous QHS   phenytoin (DILANTIN) IV  100 mg Intravenous Q8H   sodium chloride flush  10-40 mL Intracatheter Q12H   Continuous Infusions:  feeding supplement (OSMOLITE 1.5 CAL) 1,000 mL (12/03/20 1844)   levETIRAcetam Stopped (12/04/20 0950)   PRN Meds: acetaminophen, docusate sodium, polyethylene glycol, sodium chloride flush  Time spent: 35 minutes  Author: Berle Mull, MD [facility] 12/04/2020 12:02 PM  To reach On-call, see care teams to locate the attending and reach out via [email or web address]. Between 7PM-7AM, please contact night-coverage If you still have difficulty reaching the attending provider, please page the [facility] (Director on Call) for [facility] on amion for assistance.

## 2020-12-04 NOTE — Plan of Care (Signed)
  Problem: Clinical Measurements: Goal: Respiratory complications will improve Outcome: Progressing   Problem: Activity: Goal: Risk for activity intolerance will decrease Outcome: Progressing   Problem: Nutrition: Goal: Adequate nutrition will be maintained Outcome: Progressing   Problem: Elimination: Goal: Will not experience complications related to bowel motility Outcome: Progressing Goal: Will not experience complications related to urinary retention Outcome: Progressing   Problem: Skin Integrity: Goal: Risk for impaired skin integrity will decrease Outcome: Progressing

## 2020-12-05 DIAGNOSIS — R569 Unspecified convulsions: Secondary | ICD-10-CM | POA: Diagnosis not present

## 2020-12-05 DIAGNOSIS — J9601 Acute respiratory failure with hypoxia: Secondary | ICD-10-CM | POA: Diagnosis not present

## 2020-12-05 LAB — CBC
HCT: 31.3 % — ABNORMAL LOW (ref 39.0–52.0)
Hemoglobin: 10.3 g/dL — ABNORMAL LOW (ref 13.0–17.0)
MCH: 32.3 pg (ref 26.0–34.0)
MCHC: 32.9 g/dL (ref 30.0–36.0)
MCV: 98.1 fL (ref 80.0–100.0)
Platelets: 125 10*3/uL — ABNORMAL LOW (ref 150–400)
RBC: 3.19 MIL/uL — ABNORMAL LOW (ref 4.22–5.81)
RDW: 18.3 % — ABNORMAL HIGH (ref 11.5–15.5)
WBC: 9.4 10*3/uL (ref 4.0–10.5)
nRBC: 0 % (ref 0.0–0.2)

## 2020-12-05 LAB — MAGNESIUM: Magnesium: 1.6 mg/dL — ABNORMAL LOW (ref 1.7–2.4)

## 2020-12-05 LAB — GLUCOSE, CAPILLARY
Glucose-Capillary: 156 mg/dL — ABNORMAL HIGH (ref 70–99)
Glucose-Capillary: 140 mg/dL — ABNORMAL HIGH (ref 70–99)
Glucose-Capillary: 157 mg/dL — ABNORMAL HIGH (ref 70–99)
Glucose-Capillary: 184 mg/dL — ABNORMAL HIGH (ref 70–99)
Glucose-Capillary: 157 mg/dL — ABNORMAL HIGH (ref 70–99)

## 2020-12-05 LAB — BASIC METABOLIC PANEL
Anion gap: 7 (ref 5–15)
BUN: 21 mg/dL (ref 8–23)
CO2: 27 mmol/L (ref 22–32)
Calcium: 9 mg/dL (ref 8.9–10.3)
Chloride: 102 mmol/L (ref 98–111)
Creatinine, Ser: 1.2 mg/dL (ref 0.61–1.24)
GFR, Estimated: >60 mL/min (ref >60)
Glucose, Bld: 151 mg/dL — ABNORMAL HIGH (ref 70–99)
Potassium: 4.4 mmol/L (ref 3.5–5.1)
Sodium: 136 mmol/L (ref 135–145)

## 2020-12-05 LAB — PREALBUMIN: Prealbumin: 18.7 mg/dL (ref 18–38)

## 2020-12-05 MED ORDER — MAGNESIUM SULFATE 2 GM/50ML IV SOLN
2.0000 g | Freq: Once | INTRAVENOUS | Status: AC
  Administered 2020-12-05: 2 g via INTRAVENOUS
  Filled 2020-12-05: qty 50

## 2020-12-05 NOTE — Evaluation (Signed)
Physical Therapy Evaluation Patient Details Name: Walter Hernandez MRN: [id number] DOB: 06-09-1950 Today's Date: 12/05/2020   History of Present Illness  70 yo male presented from home  status epilepticus on admission was intubated 6/28.  MRI new metastases to brainstem and L temporal lobe progression of lukoencephalopathy  PMH small cell lung CA with mets to the brain and liver HTN Stabe Ib superficial malanoma L LE,   Clinical Impression  Pt in bed upon arrival of PT, agreeable to evaluation at this time. Prior to admission the pt was walking within the home with assist of his wife but no AD, performing ADLs without assist. The pt now presents with limitations in functional mobility, strength, coordination, stability, awareness, and processing due to above dx, and will continue to benefit from skilled PT to address these deficits. The pt required modA of 2 to complete bed mobility and sit-stand transfers at this time. Despite ability to initiate movement with LE, was unable to maintain sitting or standing balance without significant assist, and had drop in BP with standing. The pt was returned to supine due to drop in BP and significant fatigue. The pt's wife expressed desire to return home with pt to maintain quality of life. Discussed need for equipment and assist to reduce caregiver burden. Pt wife open to palliative care at home.      Follow Up Recommendations Other (comment) (wife wants services to maintain quality of life)    Equipment Recommendations  Wheelchair (measurements PT);Wheelchair cushion (measurements PT);Hospital bed (lift)    Recommendations for Other Services       Precautions / Restrictions Precautions Precautions: Fall Precaution Comments: seizure, cortrak, R mitt Restrictions Weight Bearing Restrictions: No      Mobility  Bed Mobility Overal bed mobility: Needs Assistance Bed Mobility: Rolling;Supine to Sit;Sit to Supine Rolling: Min guard   Supine to sit: Min  assist Sit to supine: +2 for physical assistance;Mod assist   General bed mobility comments: pt progressed to long sitting and L side exit. pt requires (A) for BIL LE back on bed surface and to control trunk due to fatigue    Transfers Overall transfer level: Needs assistance Equipment used: 2 person hand held assist Transfers: Sit to/from Stand Sit to Stand: +2 physical assistance;Mod assist         General transfer comment: pt braced against bed surface with bil LE and L lateral lean. pt able to weight shift and lift LLE  Ambulation/Gait             General Gait Details: deferred due to drop in BP      Balance Overall balance assessment: Needs assistance Sitting-balance support: Single extremity supported;Feet supported Sitting balance-Leahy Scale: Poor Sitting balance - Comments: requires UE support and minA Postural control: Posterior lean;Right lateral lean Standing balance support: Bilateral upper extremity supported Standing balance-Leahy Scale: Poor Standing balance comment: dependent on BUE support from therapists, leaning back on bed                             Pertinent Vitals/Pain Pain Assessment: No/denies pain    Home Living Family/patient expects to be discharged to:: Private residence Living Arrangements: Spouse/significant other Available Help at Discharge: Family;Available 24 hours/day Type of Home: House       Home Layout: One level Home Equipment: None Additional Comments: has a borrowed wheelchair but wife express its too heavy and big for use to use. has a dog at  home    Prior Function Level of Independence: Needs assistance   Gait / Transfers Assistance Needed: was walking to the bathroom with wife (A)  ADL's / Homemaking Assistance Needed: was performing adls        Hand Dominance   Dominant Hand: Right    Extremity/Trunk Assessment   Upper Extremity Assessment Upper Extremity Assessment: Defer to OT  evaluation RUE Deficits / Details: generalized weakness RUE Coordination: decreased fine motor;decreased gross motor LUE Deficits / Details: shoulder flexion -3 / 5 pt with activation but weaker than R side LUE Sensation: decreased proprioception LUE Coordination: decreased fine motor;decreased gross motor    Lower Extremity Assessment Lower Extremity Assessment: Generalized weakness (pt able to demo PROM against gravity, decreased coordination RLE, pt denies difference in sensation)    Cervical / Trunk Assessment Cervical / Trunk Assessment: Kyphotic  Communication   Communication: Other (comment) (soft voice)  Cognition Arousal/Alertness: Awake/alert Behavior During Therapy: Flat affect Overall Cognitive Status: Impaired/Different from baseline Area of Impairment: Safety/judgement;Awareness;Memory                     Memory: Decreased short-term memory;Decreased recall of precautions   Safety/Judgement: Decreased awareness of safety;Decreased awareness of deficits Awareness: Intellectual   General Comments: pt attempting to pull out cortrak multiple times, pt verbalized need to void bladder multiple times without any output      General Comments General comments (skin integrity, edema, etc.): BP 165/109 supine, sitting after standing 144/102 supine 167/108 HOB elevated in bed 131/117    Exercises     Assessment/Plan    PT Assessment Patient needs continued PT services  PT Problem List Decreased strength;Decreased range of motion;Decreased mobility;Decreased balance;Decreased activity tolerance;Decreased coordination;Decreased cognition;Decreased safety awareness;Pain       PT Treatment Interventions DME instruction;Gait training;Stair training;Therapeutic exercise;Functional mobility training;Therapeutic activities;Balance training;Patient/family education    PT Goals (Current goals can be found in the Care Plan section)  Acute Rehab PT Goals Patient Stated  Goal: to take patient home PT Goal Formulation: With patient/family Time For Goal Achievement: 12/19/20 Potential to Achieve Goals: Good    Frequency Min 3X/week   Barriers to discharge Decreased caregiver support;Inaccessible home environment      Co-evaluation PT/OT/SLP Co-Evaluation/Treatment: Yes Reason for Co-Treatment: Complexity of the patient's impairments (multi-system involvement);Necessary to address cognition/behavior during functional activity;For patient/therapist safety;To address functional/ADL transfers PT goals addressed during session: Mobility/safety with mobility;Balance;Strengthening/ROM OT goals addressed during session: ADL's and self-care;Proper use of Adaptive equipment and DME;Strengthening/ROM       AM-PAC PT "6 Clicks" Mobility  Outcome Measure Help needed turning from your back to your side while in a flat bed without using bedrails?: A Little Help needed moving from lying on your back to sitting on the side of a flat bed without using bedrails?: A Lot Help needed moving to and from a bed to a chair (including a wheelchair)?: Total Help needed standing up from a chair using your arms (e.g., wheelchair or bedside chair)?: Total Help needed to walk in hospital room?: Total Help needed climbing 3-5 steps with a railing? : Total 6 Click Score: 9    End of Session Equipment Utilized During Treatment: Gait belt Activity Tolerance: Patient tolerated treatment well;Patient limited by fatigue Patient left: in bed;with call bell/phone within reach;with bed alarm set;with family/visitor present;with restraints reapplied Nurse Communication: Mobility status;Need for lift equipment PT Visit Diagnosis: Other abnormalities of gait and mobility (R26.89);Muscle weakness (generalized) (M62.81)    Time: 0254-2706 PT Time  Calculation (min) (ACUTE ONLY): 37 min   Charges:   PT Evaluation $PT Eval Moderate Complexity: 1 Mod          Karma Ganja, PT, DPT   Acute  Rehabilitation Department Pager #: [phone]  Otho Bellows 12/05/2020, 2:56 PM

## 2020-12-05 NOTE — Evaluation (Signed)
Clinical/Bedside Swallow Evaluation Patient Details  Name: Walter Hernandez MRN: [id number] Date of Birth: [birth year]  Today's Date: 12/05/2020 Time: SLP Start Time (ACUTE ONLY): 0854 SLP Stop Time (ACUTE ONLY): 0930 SLP Time Calculation (min) (ACUTE ONLY): 36 min  Past Medical History:  Past Medical History:  Diagnosis Date   Cancer ([facility])    recently discovered melanoma   GERD (gastroesophageal reflux disease)    Goals of care, counseling/discussion 06/19/2019   History of hiatal hernia    Hypertension    recently placed on new blood pressure med   Malignant melanoma of skin of left lower leg ([facility]) 07/28/2015   Past Surgical History:  Past Surgical History:  Procedure Laterality Date   BRONCHIAL NEEDLE ASPIRATION BIOPSY  06/05/2019   Procedure: Bronchial Needle Aspiration Biopsies;  Surgeon: Candee Furbish, MD;  Location: [facility];  Service: Thoracic;;   CARDIAC CATHETERIZATION     15 yrs ago...came out normal   -- hiatal hernia   COLONOSCOPY  2012   Dr Earlean Shawl    ENDOBRONCHIAL ULTRASOUND N/A 06/11/2019   Procedure: ENDOBRONCHIAL ULTRASOUND;  Surgeon: Garner Nash, DO;  Location: [facility];  Service: Endoscopy;  Laterality: N/A;   ESOPHAGOGASTRODUODENOSCOPY  2012   Dr Earlean Shawl. has a really bad hiatal hernia    FINE NEEDLE ASPIRATION  06/11/2019   Procedure: FINE NEEDLE ASPIRATION (FNA) LINEAR;  Surgeon: Garner Nash, DO;  Location: [facility];  Service: Endoscopy;;   IR IMAGING GUIDED PORT INSERTION  07/03/2019   MELANOMA EXCISION WITH SENTINEL LYMPH NODE BIOPSY Left 09/29/2014   Procedure: WIDE EXCISION MELANOMA LEFT CALF WITH LEFT INGUINAL  SENTINEL LYMPH NODE BIOPSY;  Surgeon: Georganna Skeans, MD;  Location: [facility];  Service: General;  Laterality: Left;   skin lesion excised     non cancerous   VIDEO BRONCHOSCOPY N/A 06/11/2019   Procedure: VIDEO BRONCHOSCOPY WITHOUT FLUORO;  Surgeon: Garner Nash, DO;  Location: [facility];  Service: Endoscopy;  Laterality: N/A;   VIDEO  BRONCHOSCOPY WITH ENDOBRONCHIAL ULTRASOUND N/A 06/05/2019   Procedure: VIDEO BRONCHOSCOPY WITH ENDOBRONCHIAL ULTRASOUND;  Surgeon: Candee Furbish, MD;  Location: [facility];  Service: Thoracic;  Laterality: N/A;   HPI:  70 year old man with small cell lung cancer with mets to brain and liver who was brought to the emergency department with altered mental status, left-sided weakness history of seizure not long ago and aphasia.  With MRI brain negative for acute stroke, continuous EEGs. ETT 6/28-6/29.   Assessment / Plan / Recommendation Clinical Impression  Pt presents with a suspected mild to moderate oropharyngeal dysphagia. Pts spouse at bedside. Pt able to arouse during interaction with SLP however required cueing for maintaining sustained alertness. Performed oral care, some xerostomia noted. Per spouse at baseline, pt with upper and lower dentures (spouse to bring in this date). Pt with right sided anterior spillage without awareness with thin liquids via cup. During consecutive swallows of thins via straw pt with overt coughing concerning for reduced airway protection. Nectar thick liquids by cup and straw were without overt difficulty. Pt with significantly prolonged mastication of solid PO and wet vocal quality. Recommend conservative dysphagia 1 (puree) and nectar thick liquids with meds in puree. SLP to follow up for diet tolerance and advancement.  SLP Visit Diagnosis: Dysphagia, oral phase (R13.11);Dysphagia, unspecified (R13.10)    Aspiration Risk  Mild aspiration risk;Moderate aspiration risk    Diet Recommendation Dysphagia 1 (puree) nectar (mildly thick) liquids     Medication Administration: Whole meds with puree  Other  Recommendations Oral Care Recommendations: Oral care BID Other Recommendations: Order thickener from pharmacy   Follow up Recommendations Other (comment) (TBD)      Frequency and Duration min 2x/week  2 weeks       Prognosis Prognosis for Safe Diet  Advancement: Good Barriers to Reach Goals: Cognitive deficits;Time post onset;Severity of deficits      Swallow Study   General Date of Onset: 11/30/20 HPI: 70 year old man with small cell lung cancer with mets to brain and liver who was brought to the emergency department with altered mental status, left-sided weakness history of seizure not long ago and aphasia.  With MRI brain negative for acute stroke, continuous EEGs. ETT 6/28-6/29. Type of Study: Bedside Swallow Evaluation Previous Swallow Assessment: none on file Diet Prior to this Study: NPO Temperature Spikes Noted: No Respiratory Status: Room air History of Recent Intubation: Yes Length of Intubations (days): 1 days Date extubated: 12/02/20 Behavior/Cognition: Requires cueing;Distractible;Cooperative Oral Cavity Assessment: Dry Oral Care Completed by SLP: Yes Oral Cavity - Dentition: Dentures, not available (spouse to bring upper and lower dentures) Vision: Functional for self-feeding Self-Feeding Abilities: Needs assist Patient Positioning: Upright in bed Baseline Vocal Quality: Low vocal intensity Volitional Cough: Congested Volitional Swallow: Able to elicit    Oral/Motor/Sensory Function Overall Oral Motor/Sensory Function: Generalized oral weakness   Ice Chips Ice chips: Impaired Presentation: Spoon Oral Phase Impairments: Impaired mastication;Reduced labial seal Oral Phase Functional Implications: Prolonged oral transit Pharyngeal Phase Impairments: Multiple swallows   Thin Liquid Thin Liquid: Impaired Presentation: Cup;Straw Oral Phase Impairments: Reduced labial seal Oral Phase Functional Implications: Right anterior spillage Pharyngeal  Phase Impairments: Suspected delayed Swallow;Multiple swallows;Cough - Immediate;Cough - Delayed    Nectar Thick Nectar Thick Liquid: Within functional limits Presentation: Cup;Straw   Honey Thick Honey Thick Liquid: Not tested   Puree Puree: Within functional limits    Solid     Solid: Impaired Presentation: Self Fed Oral Phase Impairments: Reduced lingual movement/coordination;Impaired mastication Oral Phase Functional Implications: Prolonged oral transit;Impaired mastication;Oral residue Pharyngeal Phase Impairments: Suspected delayed Swallow;Multiple swallows;Wet Vocal Quality      Gabby Rackers H. MA, CCC-SLP Acute Rehabilitation Services   12/05/2020,9:47 AM

## 2020-12-05 NOTE — Progress Notes (Signed)
Neurology Progress Note   S:// Patient seen and examined.  His wife is at bedside.  On no sedation, sitting up in bed getting a swallowing evaluation done this morning.  O:// Current vital signs: BP 127/80   Pulse 80   Temp (!) 97.5 F (36.4 C) (Oral)   Resp 19   Ht 5\' 7"  (1.702 m)   Wt 71.7 kg   SpO2 100%   BMI 24.76 kg/m  Vital signs in last 24 hours: Temp:  [97.5 F (36.4 C)-98.7 F (37.1 C)] 97.5 F (36.4 C) (07/02 0700) Pulse Rate:  [73-92] 80 (07/02 0800) Resp:  [11-29] 19 (07/02 0800) BP: (120-172)/(74-132) 127/80 (07/02 0800) SpO2:  [93 %-100 %] 100 % (07/02 0800) Weight:  [65.8 kg-71.7 kg] 71.7 kg (07/02 0400) General: Awake alert does not appear to be in distress HEENT: Normocephalic/atraumatic/lungs: Clear Cardiovascular: Regular rate rhythm Abdomen nondistended nontender Extremities warm well perfused Neurological exam Is awake alert oriented to self, oriented to his wife and aware of the fact that he is in the hospital. Her speech is moderately dysarthric but he is also edentulous-wife says he is dysarthric without his dentures and this might be close to his baseline without the dentures in terms of dysarthria. No evidence of aphasia Mildly diminished attention concentration Cranial nerves II to XII intact Motor exam with left upper extremity weakness but today is at least 4 out of 5 strength in the left upper extremity and nearly 4+/5 in the right upper extremity.  Both legs are barely antigravity-severely deconditioned Sensation intact to touch Coordination-no obvious dysmetria   Medications  Current Facility-Administered Medications:    acetaminophen (TYLENOL) tablet 650 mg, 650 mg, Per Tube, Q6H PRN, Lavina Hamman, MD, 650 mg at 12/04/20 1203   chlorhexidine (PERIDEX) 0.12 % solution 15 mL, 15 mL, Mouth Rinse, BID, Chand, Sudham, MD, 15 mL at 12/05/20 0945   Chlorhexidine Gluconate Cloth 2 % PADS 6 each, 6 each, Topical, Daily, Chand, Currie Paris, MD, 6  each at 12/04/20 1230   dexamethasone (DECADRON) injection 4 mg, 4 mg, Intravenous, Q8H, Ennever, Rudell Cobb, MD, 4 mg at 12/05/20 0555   docusate sodium (COLACE) capsule 100 mg, 100 mg, Oral, BID PRN, Estill Cotta, NP   enoxaparin (LOVENOX) injection 40 mg, 40 mg, Subcutaneous, Q24H, Chand, Sudham, MD, 40 mg at 12/05/20 0943   feeding supplement (OSMOLITE 1.5 CAL) liquid 1,000 mL, 1,000 mL, Per Tube, Continuous, Chand, Sudham, MD, Last Rate: 55 mL/hr at 12/04/20 1442, 1,000 mL at 12/04/20 1442   feeding supplement (PROSource TF) liquid 45 mL, 45 mL, Per Tube, TID, Chand, Sudham, MD, 45 mL at 12/05/20 0942   insulin aspart (novoLOG) injection 0-9 Units, 0-9 Units, Subcutaneous, Q4H, Lavina Hamman, MD, 1 Units at 12/05/20 0758   levETIRAcetam (KEPPRA) IVPB 500 mg/100 mL premix, 500 mg, Intravenous, Q12H, Amie Portland, MD, Last Rate: 400 mL/hr at 12/05/20 0944, 500 mg at 12/05/20 0944   magnesium sulfate IVPB 2 g 50 mL, 2 g, Intravenous, Once, Lavina Hamman, MD   MEDLINE mouth rinse, 15 mL, Mouth Rinse, q12n4p, Chand, Sudham, MD, 15 mL at 12/04/20 1557   multivitamin with minerals tablet 1 tablet, 1 tablet, Per Tube, Daily, Jacky Kindle, MD, 1 tablet at 12/05/20 0942   mupirocin ointment (BACTROBAN) 2 % 1 application, 1 application, Nasal, BID, Jacky Kindle, MD, 1 application at 91/47/82 0944   pantoprazole (PROTONIX) injection 40 mg, 40 mg, Intravenous, QHS, Estill Cotta, NP, 40 mg at 12/04/20 2127  phenytoin (DILANTIN) injection 100 mg, 100 mg, Intravenous, Q8H, Toberman, Stevi W, NP, 100 mg at 12/05/20 0555   polyethylene glycol (MIRALAX / GLYCOLAX) packet 17 g, 17 g, Oral, Daily PRN, Estill Cotta, NP   sodium chloride flush (NS) 0.9 % injection 10-40 mL, 10-40 mL, Intracatheter, Q12H, Chand, Sudham, MD, 10 mL at 12/05/20 0945   sodium chloride flush (NS) 0.9 % injection 10-40 mL, 10-40 mL, Intracatheter, PRN, Jacky Kindle, MD  Facility-Administered Medications Ordered in  Other Encounters:    sodium chloride flush (NS) 0.9 % injection 10 mL, 10 mL, Intravenous, PRN, Volanda Napoleon, MD, 10 mL at 10/21/19 0912 Labs CBC    Component Value Date/Time   WBC 9.4 12/05/2020 0551   RBC 3.19 (L) 12/05/2020 0551   HGB 10.3 (L) 12/05/2020 0551   HGB 8.5 (L) 11/24/2020 0830   HGB 16.1 01/30/2017 0804   HCT 31.3 (L) 12/05/2020 0551   HCT 44.9 01/30/2017 0804   PLT 125 (L) 12/05/2020 0551   PLT 97 (L) 11/24/2020 0830   PLT 186 01/30/2017 0804   MCV 98.1 12/05/2020 0551   MCV 101 (H) 01/30/2017 0804   MCH 32.3 12/05/2020 0551   MCHC 32.9 12/05/2020 0551   RDW 18.3 (H) 12/05/2020 0551   RDW 11.8 01/30/2017 0804   LYMPHSABS 0.7 12/04/2020 1030   LYMPHSABS 1.8 01/30/2017 0804   MONOABS 0.9 12/04/2020 1030   EOSABS 0.0 12/04/2020 1030   EOSABS 0.1 01/30/2017 0804   BASOSABS 0.0 12/04/2020 1030   BASOSABS 0.0 01/30/2017 0804    CMP     Component Value Date/Time   NA 136 12/05/2020 0551   NA 142 01/30/2017 0804   K 4.4 12/05/2020 0551   K 4.1 01/30/2017 0804   CL 102 12/05/2020 0551   CL 105 01/25/2016 0926   CO2 27 12/05/2020 0551   CO2 29 01/30/2017 0804   GLUCOSE 151 (H) 12/05/2020 0551   GLUCOSE 105 01/30/2017 0804   GLUCOSE 113 01/25/2016 0926   BUN 21 12/05/2020 0551   BUN 6.9 (L) 01/30/2017 0804   CREATININE 1.20 12/05/2020 0551   CREATININE 1.53 (H) 11/24/2020 0830   CREATININE 0.8 01/30/2017 0804   CALCIUM 9.0 12/05/2020 0551   CALCIUM 10.5 (H) 01/30/2017 0804   PROT 5.4 (L) 12/04/2020 1030   PROT 7.9 01/30/2017 0804   ALBUMIN 2.8 (L) 12/04/2020 1030   ALBUMIN 4.5 01/30/2017 0804   AST 11 (L) 12/04/2020 1030   AST 13 (L) 11/24/2020 0830   AST 34 01/30/2017 0804   ALT 8 12/04/2020 1030   ALT 7 11/24/2020 0830   ALT 18 01/30/2017 0804   ALKPHOS 58 12/04/2020 1030   ALKPHOS 79 01/30/2017 0804   BILITOT 0.4 12/04/2020 1030   BILITOT 0.4 11/24/2020 0830   BILITOT 1.49 (H) 01/30/2017 0804   GFRNONAA >60 12/05/2020 0551   GFRNONAA 48  (L) 11/24/2020 0830   GFRAA >60 03/02/2020 1135    Imaging I have reviewed images in epic and the results pertinent to this consultation are: No new imaging to review  Assessment: 70 year old with past medical history of hypertension, malignant melanoma of left lower leg, small cell lung cancer with metastasis to the liver and the brain, presented as a code stroke but was deemed to possibly be having seizures. Imaging negative for stroke but revealed new metastases-1 in the brainstem and 1 in the left temporal lobe with resolution of the prior seen metastases. EEG concerning for left hemispheric dysfunction with lateralized rhythmic  delta activity in the left hemisphere, which improved with antiepileptic treatment. Clinical exam improving with the best exam today where he is sitting in bed and having a reasonably coherent conversation which is much different from the prior 2 days. Likely prolonged postictal in the setting of poor brain reserve. Comparison of MRIs from 2022 now shows advanced progression of leukoencephalopathy, in addition to the metastasis.  Seen by oncology: Recommend hospice but the wife is not ready to take that step yet.  Oncology does not recommend any further scanning for systemic malignancy.  They believe that although he has fought hard with his small cell lung cancer and the metastases but the disease becomes resistant to therapy and just progresses and that is where he is at this time.  Appreciate palliative medicine assistance as well.  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 Continue Dilantin 100 3 times daily Check Dilantin level in a week Continue IV dexamethasone 4 mg every 6 hours. Change Keppra, Dilantin and dexamethasone medications to p.o. one-to-one conversion when able to  take p.o. Appreciate palliative medicine and oncology consultations.  Inpatient neurology will be available as needed.    -- Amie Portland, MD Neurologist [facility] Pager: [phone]

## 2020-12-05 NOTE — Progress Notes (Addendum)
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 Presented to hospital with confusion.  Found to have status epilepticus was intubated and was with PICU. Transfer to hospital service on 12/04/2020. Currently plan is monitor for improvement in mentation, toleration of the oral diet and follow-up on oncology recommendations.  Assessment and Plan: 1.  Status epilepticus Metastatic non-small cell cancer with brain metastasis. Acute metabolic encephalopathy Presented with confusion. Found to have status epilepticus. Neurology was consulted. Patient was admitted to the ICU. On Keppra 500 mg twice daily, dose increased to 1000 mg twice daily.  Long-term EEG monitoring did not show any ongoing seizures. Keppra dose reduced to 500 mg twice daily again. Patient was also started on Dilantin. Received 1 dose of Vimpat Mentation improving.  Patient is able to follow commands.  Able to answer questions. Speech therapy consulted.  Dysphagia 1 diet with nectar thick liquid.  Patient has slow mastication.  During my interview patient drowsy and lethargic.  Concern with aspiration risk, wife aware. He has NG tube. PT OT consulted as well. Transfer out of the ICU to telemetry unit.  2.  Stage Ib melanoma. Small cell lung cancer with metastasis. Discussed with oncology. We will follow-up on the patient. Neurology discussed with neuro oncology.  Not a good candidate for further radiation therapy. Patient has seen oncology outpatient. Patient performance status has been worsening therefore not a good candidate for chemotherapy as well. Palliative care consulted for goals of care conversation.  3.  Dysphagia. Speech therapy consulted. Currently has NG tube. Currently  on dysphagia 1 diet nectar thick liquid.  4.  Acute respiratory failure with hypoxia, present on admission. Required intubation on admission. Secondary to seizures. Currently on room air. Monitor.  Chest x-ray negative for any acute abnormality.  5.  Hyponatremia, hypomagnesemia Currently corrected. Monitor.  6.  Acute kidney injury on CKD 3A. Renal function improved with IV hydration. Likely pre-renal etiology Monitor.  Avoid nephrotoxic medication.  7.  Anemia and thrombocytopenia secondary to chemotherapy as well as nutritional deficiency. Currently H&H and platelets are stable. Monitor.  8.  Goals of care conversation. Wife at bedside.  Was asking about whether any other therapy available to improve patient's demyelination or white matter disease. Explained about current standard of care. Explained about potential for recurrence of the patient with small cell cancer.  She verbalized understanding.  Palliative care continue to follow.  Ideally patient would benefit from DNR/DNI status.  Body mass index is 24.76 kg/m.  Nutrition Problem: Increased nutrient needs Etiology: cancer and cancer related treatments Interventions: Interventions: Tube feeding, Prostat, MVI      Diet: N.p.o. for now.  Speech therapy consulted.  Tube feedings on. DVT Prophylaxis:   enoxaparin (LOVENOX) injection 40 mg Start: 12/03/20 1000 SCDs Start: 12/01/20 1601    Advance goals of care discussion: Full code  Family Communication: family was present at bedside, at the time of interview.  Opportunity was given to ask question and all questions were answered satisfactorily.   Disposition:  Status is: Inpatient  Remains inpatient appropriate because:IV treatments appropriate due to intensity of illness or inability to take PO and Inpatient level of care appropriate due to severity of illness  Dispo: The patient is from: Home  Anticipated d/c is to: SNF              Patient  currently is not medically stable to d/c.   Difficult to place patient No        Subjective: Alert.  Oriented.  Answering questions.  Following commands.  Later on become tired and was not able to follow any commands.  No acute events.  Physical Exam:  General: Appear in mild distress, no Rash; Oral Mucosa Clear, moist. no Abnormal Neck Mass Or lumps, Conjunctiva normal  Cardiovascular: S1 and S2 Present, no Murmur, Respiratory: good respiratory effort, Bilateral Air entry present and CTA, no Crackles, no wheezes Abdomen: Bowel Sound present, Soft and no tenderness Extremities: no Pedal edema Neurology: alert and oriented to time, place, and person affect appropriate. no new focal deficit improving left-sided upper extremity weakness. Gait not checked due to patient safety concerns   Vitals:   12/05/20 1520 12/05/20 1600 12/05/20 1700 12/05/20 1820  BP: (!) 143/126 (!) 148/106 (!) 166/104 (!) 163/115  Pulse: (!) 109 (!) 104 (!) 104 98  Resp: 19 (!) 23 (!) 33 18  Temp:  97.8 F (36.6 C)    TempSrc:  Oral    SpO2: 100% 99% 100% 98%  Weight:      Height:       Filed Weights   12/04/20 1230 12/04/20 1300 12/05/20 0400  Weight: 65.8 kg 65.9 kg 71.7 kg    Data Reviewed: I have personally reviewed and interpreted daily labs, tele strips, imaging. I reviewed all nursing notes, pharmacy notes, vitals, pertinent old records I have discussed plan of care as described above with RN and patient/family.  CBC: Recent Labs  Lab 12/01/20 1050 12/01/20 1053 12/01/20 1757 12/02/20 0534 12/03/20 0522 12/04/20 1030 12/05/20 0551  WBC 6.2  --   --  7.6 9.3 10.9* 9.4  NEUTROABS 2.7  --   --   --   --  9.2*  --   HGB 11.5*   < > 11.9* 10.6* 10.3* 9.9* 10.3*  HCT 33.0*   < > 35.0* 30.7* 30.3* 29.8* 31.3*  MCV 96.2  --   --  94.8 97.4 99.0 98.1  PLT 104*  --   --  103* 106* 100* 125*   < > = values in this interval not displayed.    Basic Metabolic Panel: Recent Labs  Lab  12/01/20 1050 12/01/20 1053 12/01/20 1757 12/02/20 0534 12/02/20 0534 12/02/20 1625 12/03/20 0522 12/03/20 1855 12/04/20 0625 12/04/20 1030 12/05/20 0551  NA 134* 134* 130* 133*  --   --  135  --   --  135 136  K 4.0 4.1 4.4 4.9  --   --  4.1  --   --  4.5 4.4  CL 97* 98  --  104  --   --  104  --   --  104 102  CO2 25  --   --  21*  --   --  25  --   --  26 27  GLUCOSE 120* 116*  --  144*  --   --  123*  --   --  176* 151*  BUN 7* 7*  --  13  --   --  16  --   --  20 21  CREATININE 1.69* 1.60*  --  1.79*  --   --  1.60*  --   --  1.32* 1.20  CALCIUM 9.1  --   --  8.4*  --   --  8.0*  --   --  8.7* 9.0  MG  --   --   --  1.0*   < > 2.2 1.8 2.1 1.9  --  1.6*  PHOS  --   --   --  4.9*  --  3.6 3.9 3.6 3.1  --   --    < > = values in this interval not displayed.     Studies: No results found.  Scheduled Meds:  chlorhexidine  15 mL Mouth Rinse BID   Chlorhexidine Gluconate Cloth  6 each Topical Daily   dexamethasone (DECADRON) injection  4 mg Intravenous Q8H   enoxaparin (LOVENOX) injection  40 mg Subcutaneous Q24H   feeding supplement (PROSource TF)  45 mL Per Tube TID   insulin aspart  0-9 Units Subcutaneous Q4H   mouth rinse  15 mL Mouth Rinse q12n4p   multivitamin with minerals  1 tablet Per Tube Daily   mupirocin ointment  1 application Nasal BID   pantoprazole (PROTONIX) IV  40 mg Intravenous QHS   phenytoin (DILANTIN) IV  100 mg Intravenous Q8H   sodium chloride flush  10-40 mL Intracatheter Q12H   Continuous Infusions:  feeding supplement (OSMOLITE 1.5 CAL) 1,000 mL (12/04/20 1442)   levETIRAcetam 500 mg (12/05/20 0944)   PRN Meds: acetaminophen, docusate sodium, polyethylene glycol, sodium chloride flush  Time spent: 35 minutes  Author: Berle Mull, MD [facility] 12/05/2020 7:16 PM  To reach On-call, see care teams to locate the attending and reach out via [email or web address]. Between 7PM-7AM, please contact night-coverage If you still have difficulty  reaching the attending provider, please page the [facility] (Director on Call) for [facility] on amion for assistance.

## 2020-12-05 NOTE — Evaluation (Signed)
Occupational Therapy Evaluation Patient Details Name: Walter Hernandez MRN: [id number] DOB: 11-02-1950 Today's Date: 12/05/2020    History of Present Illness 70 yo male presented from home  status epilepticus on admission was intubated 6/28.  MRI new metastases to brainstem and L temporal lobe progression of lukoencephalopathy  PMH small cell lung CA with mets to the brain and liver HTN Stabe Ib superficial malanoma L LE,   Clinical Impression   PT admitted with seizures. Pt currently with functional limitiations due to the deficits listed below (see OT problem list). Pt was completing adls mod I to min (A) prior to admission. Pt now requires +2 (A) for basic transfer with orthostatic BP. Wife expressing her strong desire to take patient home with DME being covered. Wife advised to speak further with palliative services about services to return home. Pt educated that if going home without palliative (A) hospital bed and lift equipment will be rental with cost. Pt asking if medical transport via EMS could be used to take him to doctor appointments. Pt again directed to talking to palliative services as the patient could use doctors and nurses weekly visits if using their services. Pt educated that accepting help doesn't mean end of life but rather a resource to help her provide care / remain in the home. Pt states 'that does make me feel better" pt is very upset by the word "hospice" because of her father using hospice when he passed. Recommendation for transport care, hoyer lift and hospital bed. Pt will benefit from skilled OT to increase their independence and safety with adls and balance to allow discharge home per wife ( will need SNF if unable to accept hospice level palliative services).     Follow Up Recommendations  Other (comment) (wants to go home with services per wife)    [facility] bed;Other (comment);3 in 1 bedside commode (transport chair) hoyer lift    Recommendations for Other Services Other (comment) (palliative care)     Precautions / Restrictions Precautions Precautions: Fall Precaution Comments: seizure cortrak      Mobility Bed Mobility Overal bed mobility: Needs Assistance Bed Mobility: Rolling;Supine to Sit;Sit to Supine Rolling: Min guard   Supine to sit: Min assist Sit to supine: +2 for physical assistance;Mod assist   General bed mobility comments: pt progressed to long sitting and L side exit. pt requires (A) for BIL LE back on bed surface and to control trunk due to fatigue    Transfers Overall transfer level: Needs assistance Equipment used: 2 person hand held assist Transfers: Sit to/from Stand Sit to Stand: +2 physical assistance;Mod assist         General transfer comment: pt braced against bed surface with bil LE and L lateral lean. pt able to weight shift and lift LLE    Balance                                           ADL either performed or assessed with clinical judgement   ADL Overall ADL's : Needs assistance/impaired Eating/Feeding: NPO   Grooming: Maximal assistance   Upper Body Bathing: Maximal assistance   Lower Body Bathing: Total assistance           Toilet Transfer: +2 for physical assistance;Maximal assistance             General ADL Comments: pt progressed to EOB and  static sititng with (A) . pt unable to sustain static sit. pt completed sit<>stand with orthostatic BP     Vision Baseline Vision/History: Wears glasses Wears Glasses: Reading only       Perception     Praxis      Pertinent Vitals/Pain Pain Assessment: No/denies pain     Hand Dominance Right   Extremity/Trunk Assessment Upper Extremity Assessment Upper Extremity Assessment: RUE deficits/detail;LUE deficits/detail RUE Deficits / Details: generalized weakness RUE Coordination: decreased fine motor;decreased gross motor LUE Deficits / Details: shoulder flexion -3 / 5 pt  with activation but weaker than R side LUE Sensation: decreased proprioception LUE Coordination: decreased fine motor;decreased gross motor   Lower Extremity Assessment Lower Extremity Assessment: Defer to PT evaluation   Cervical / Trunk Assessment Cervical / Trunk Assessment: Kyphotic   Communication Communication Communication: Other (comment) (soft voice quality)   Cognition Arousal/Alertness: Awake/alert Behavior During Therapy: Flat affect Overall Cognitive Status: Impaired/Different from baseline Area of Impairment: Safety/judgement;Awareness;Memory                     Memory: Decreased short-term memory;Decreased recall of precautions   Safety/Judgement: Decreased awareness of safety;Decreased awareness of deficits Awareness: Intellectual   General Comments: pt attempting to pull out cortrak multiple times, pt verbalized need to void bladder multiple times without any output   General Comments  BP 165/109 supine, sitting after standing 144/102 supine 167/108 HOB elevated in bed 131/117    Exercises     Shoulder Instructions      Home Living Family/patient expects to be discharged to:: Private residence Living Arrangements: Spouse/significant other Available Help at Discharge: Family;Available 24 hours/day (just wife) Type of Home: House       Home Layout: One level     Bathroom Shower/Tub: Teacher, early years/pre: Standard     Home Equipment: None   Additional Comments: has a borrowed wheelchair but wife express its too heavy and big for use to use. has a dog at home      Prior Functioning/Environment Level of Independence: Needs assistance  Gait / Transfers Assistance Needed: was walking to the bathroom with wife (A) ADL's / Homemaking Assistance Needed: was performing adls            OT Problem List: Decreased strength;Decreased activity tolerance;Impaired balance (sitting and/or standing);Decreased cognition;Decreased safety  awareness;Decreased knowledge of use of DME or AE;Decreased knowledge of precautions;Impaired UE functional use;Cardiopulmonary status limiting activity      OT Treatment/Interventions: Self-care/ADL training;Therapeutic exercise;Neuromuscular education;Energy conservation;Manual therapy;DME and/or AE instruction;Therapeutic activities;Cognitive remediation/compensation;Patient/family education;Balance training    OT Goals(Current goals can be found in the care plan section) Acute Rehab OT Goals Patient Stated Goal: to take patient home OT Goal Formulation: With family Time For Goal Achievement: 12/19/20 Potential to Achieve Goals: Poor  OT Frequency: Min 2X/week   Barriers to D/C:            Co-evaluation PT/OT/SLP Co-Evaluation/Treatment: Yes Reason for Co-Treatment: Complexity of the patient's impairments (multi-system involvement);Necessary to address cognition/behavior during functional activity;For patient/therapist safety;To address functional/ADL transfers   OT goals addressed during session: ADL's and self-care;Proper use of Adaptive equipment and DME;Strengthening/ROM      AM-PAC OT "6 Clicks" Daily Activity     Outcome Measure Help from another person eating meals?: A Lot Help from another person taking care of personal grooming?: A Lot Help from another person toileting, which includes using toliet, bedpan, or urinal?: A Lot Help from another person bathing (including  washing, rinsing, drying)?: A Lot Help from another person to put on and taking off regular upper body clothing?: A Lot Help from another person to put on and taking off regular lower body clothing?: A Lot 6 Click Score: 12   End of Session Equipment Utilized During Treatment: Oxygen Nurse Communication: Mobility status;Precautions  Activity Tolerance: Patient limited by fatigue Patient left: in bed;with call bell/phone within reach;with bed alarm set;with family/visitor present;with restraints  reapplied  OT Visit Diagnosis: Unsteadiness on feet (R26.81);Muscle weakness (generalized) (M62.81)                Time: 7939-0300 OT Time Calculation (min): 36 min Charges:  OT General Charges $OT Visit: 1 Visit OT Evaluation $OT Eval Moderate Complexity: 1 Mod   Brynn, OTR/L  Acute Rehabilitation Services Pager: [phone] Office: [phone] .   Jeri Modena 12/05/2020, 2:43 PM

## 2020-12-06 ENCOUNTER — Inpatient Hospital Stay (HOSPITAL_COMMUNITY): Payer: Medicare Other

## 2020-12-06 DIAGNOSIS — G40901 Epilepsy, unspecified, not intractable, with status epilepticus: Secondary | ICD-10-CM | POA: Diagnosis not present

## 2020-12-06 DIAGNOSIS — J9601 Acute respiratory failure with hypoxia: Secondary | ICD-10-CM | POA: Diagnosis not present

## 2020-12-06 DIAGNOSIS — R569 Unspecified convulsions: Secondary | ICD-10-CM | POA: Diagnosis not present

## 2020-12-06 DIAGNOSIS — G9341 Metabolic encephalopathy: Secondary | ICD-10-CM | POA: Diagnosis not present

## 2020-12-06 DIAGNOSIS — Z515 Encounter for palliative care: Secondary | ICD-10-CM | POA: Diagnosis not present

## 2020-12-06 DIAGNOSIS — C3490 Malignant neoplasm of unspecified part of unspecified bronchus or lung: Secondary | ICD-10-CM | POA: Diagnosis not present

## 2020-12-06 LAB — GLUCOSE, CAPILLARY
Glucose-Capillary: 114 mg/dL — ABNORMAL HIGH (ref 70–99)
Glucose-Capillary: 115 mg/dL — ABNORMAL HIGH (ref 70–99)
Glucose-Capillary: 116 mg/dL — ABNORMAL HIGH (ref 70–99)
Glucose-Capillary: 118 mg/dL — ABNORMAL HIGH (ref 70–99)
Glucose-Capillary: 118 mg/dL — ABNORMAL HIGH (ref 70–99)
Glucose-Capillary: 97 mg/dL (ref 70–99)

## 2020-12-06 LAB — BASIC METABOLIC PANEL
Anion gap: 9 (ref 5–15)
BUN: 34 mg/dL — ABNORMAL HIGH (ref 8–23)
CO2: 26 mmol/L (ref 22–32)
Calcium: 8.6 mg/dL — ABNORMAL LOW (ref 8.9–10.3)
Chloride: 102 mmol/L (ref 98–111)
Creatinine, Ser: 1.89 mg/dL — ABNORMAL HIGH (ref 0.61–1.24)
GFR, Estimated: 37 mL/min — ABNORMAL LOW (ref >60)
Glucose, Bld: 113 mg/dL — ABNORMAL HIGH (ref 70–99)
Potassium: 6.2 mmol/L — ABNORMAL HIGH (ref 3.5–5.1)
Sodium: 137 mmol/L (ref 135–145)

## 2020-12-06 LAB — POCT I-STAT 7, (LYTES, BLD GAS, ICA,H+H)
Acid-Base Excess: 1 mmol/L (ref 0.0–2.0)
Acid-Base Excess: 0 mmol/L (ref 0.0–2.0)
Bicarbonate: 25.3 mmol/L (ref 20.0–28.0)
Bicarbonate: 25.4 mmol/L (ref 20.0–28.0)
Calcium, Ion: 1.26 mmol/L (ref 1.15–1.40)
Calcium, Ion: 1.24 mmol/L (ref 1.15–1.40)
HCT: 33 % — ABNORMAL LOW (ref 39.0–52.0)
HCT: 30 % — ABNORMAL LOW (ref 39.0–52.0)
Hemoglobin: 11.2 g/dL — ABNORMAL LOW (ref 13.0–17.0)
Hemoglobin: 10.2 g/dL — ABNORMAL LOW (ref 13.0–17.0)
O2 Saturation: 98 %
O2 Saturation: 100 %
Patient temperature: 98.7
Patient temperature: 98.6
Potassium: 5.9 mmol/L — ABNORMAL HIGH (ref 3.5–5.1)
Potassium: 5.5 mmol/L — ABNORMAL HIGH (ref 3.5–5.1)
Sodium: 135 mmol/L (ref 135–145)
Sodium: 137 mmol/L (ref 135–145)
TCO2: 26 mmol/L (ref 22–32)
TCO2: 27 mmol/L (ref 22–32)
pCO2 arterial: 38.1 mmHg (ref 32.0–48.0)
pCO2 arterial: 43.4 mmHg (ref 32.0–48.0)
pH, Arterial: 7.43 (ref 7.350–7.450)
pH, Arterial: 7.375 (ref 7.350–7.450)
pO2, Arterial: 96 mmHg (ref 83.0–108.0)
pO2, Arterial: 202 mmHg — ABNORMAL HIGH (ref 83.0–108.0)

## 2020-12-06 IMAGING — DX DG CHEST 1V PORT
1 series · 1 of 1 positions shown · non-contrast
Comparison: [DATE]

CLINICAL DATA: Respiratory abnormalities

EXAM:
PORTABLE CHEST 1 VIEW

[chest]
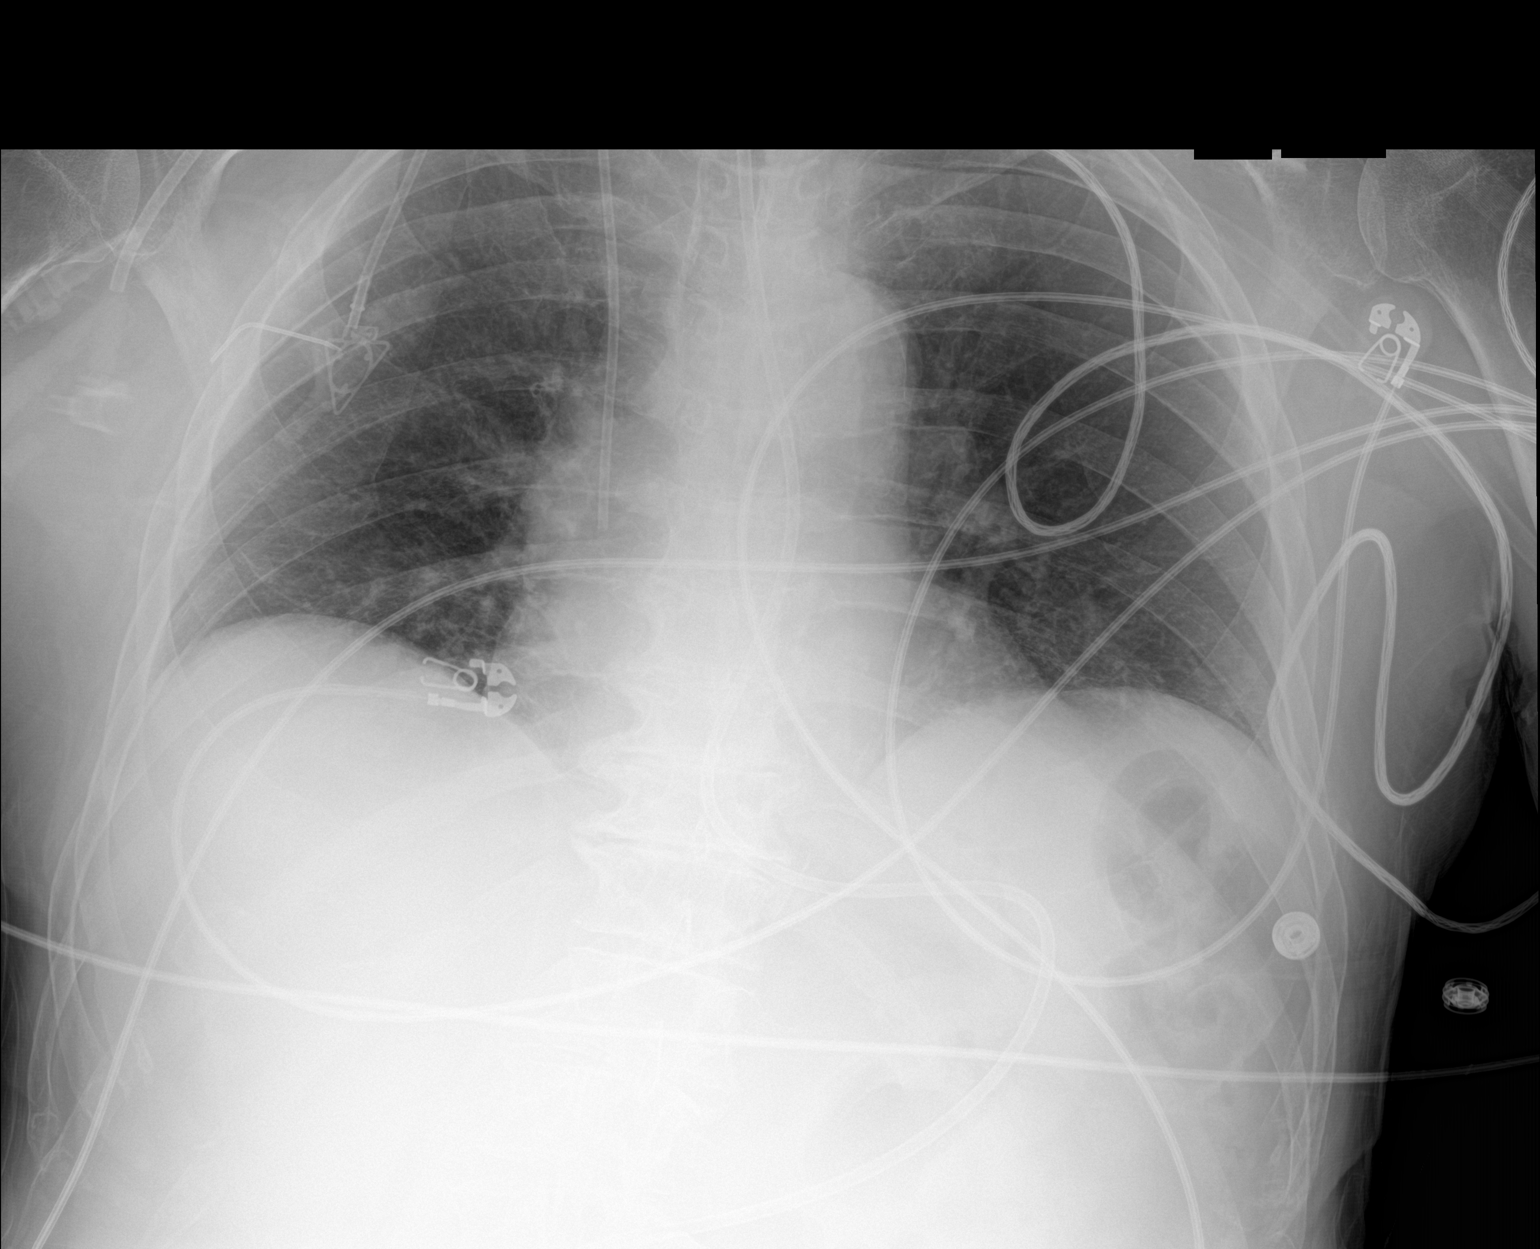

[1 of 1 positions shown; findings below may reference images not displayed]

FINDINGS: Since the previous study, the endotracheal tube is been removed. An
enteric tube has been placed. Tip is off the field of view but well
below the hemidiaphragms. Power port type central venous catheter is
unchanged in position. Shallow inspiration. Heart size and pulmonary
vascularity are normal. Lungs are clear.
IMPRESSION: Appliances appear in satisfactory location. No evidence of active
pulmonary disease.

## 2020-12-06 IMAGING — DX DG CHEST 1V PORT
1 series · 1 of 1 positions shown · non-contrast
Comparison: Chest x-ray [DATE] [DATE] a.m.

CLINICAL DATA: Encounter for endotracheal tube placement.

EXAM:
PORTABLE CHEST 1 VIEW

[chest]
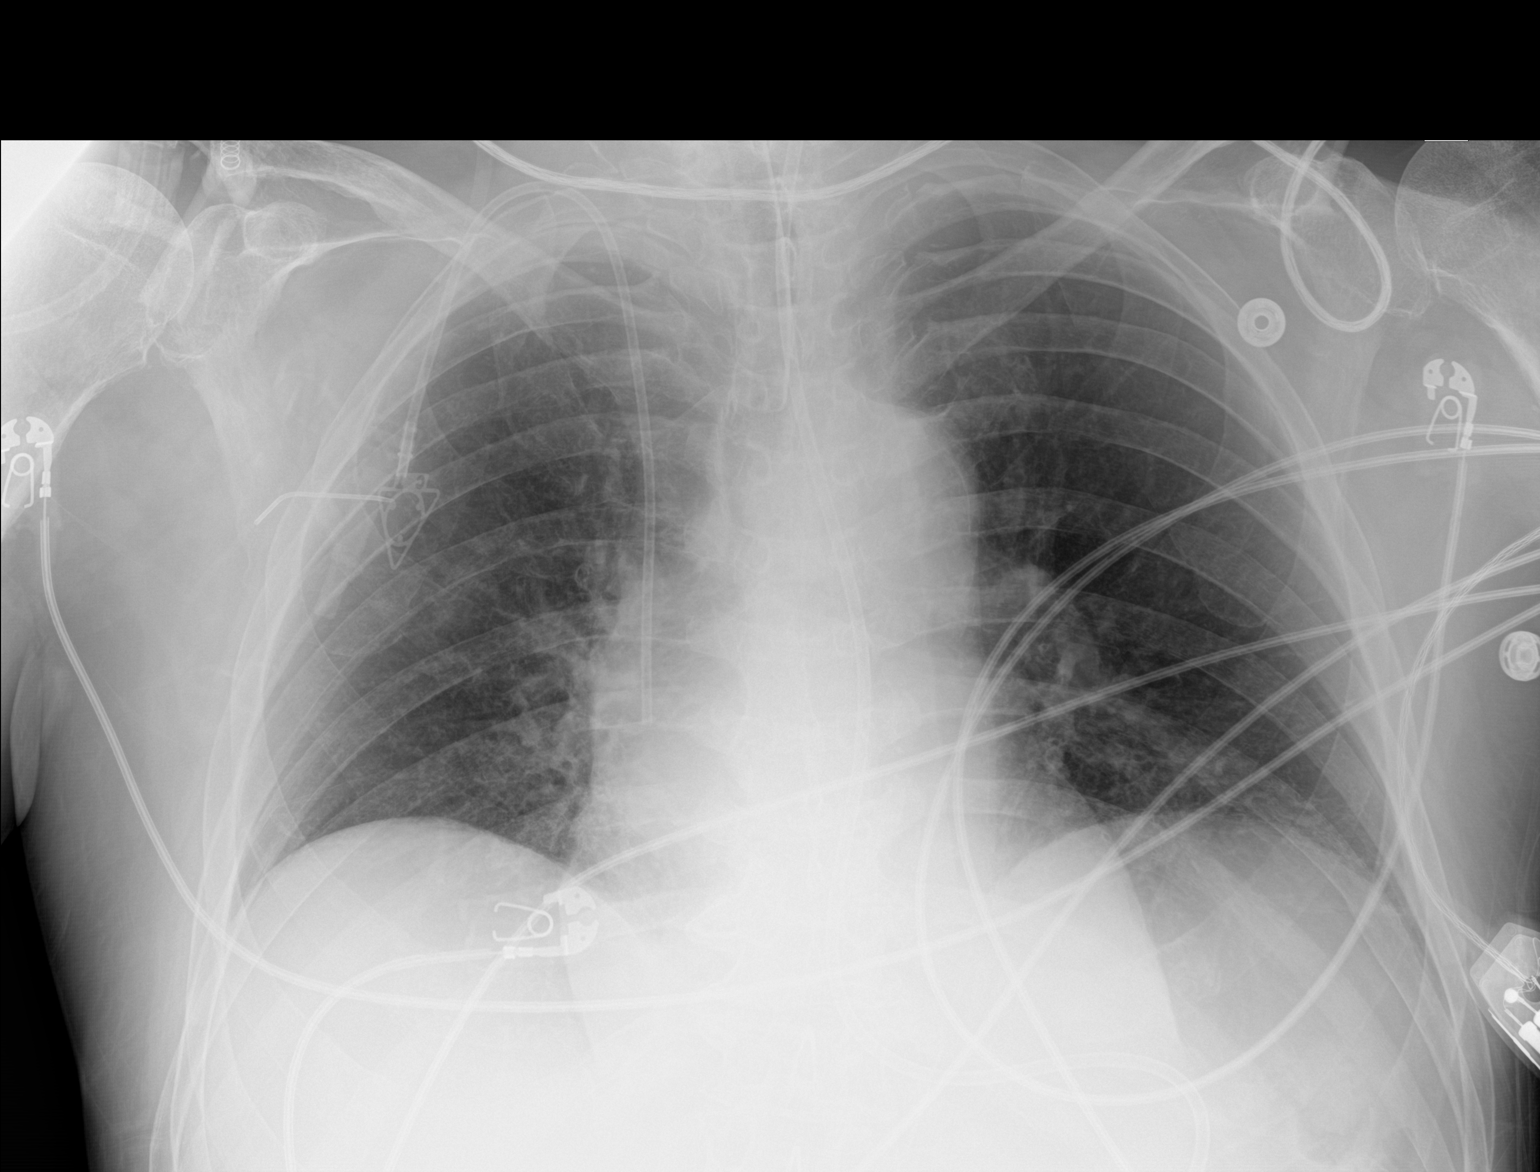

[1 of 1 positions shown; findings below may reference images not displayed]

FINDINGS: Interval placement endotracheal tube with tip terminating 4.5 cm
above the carina. Right chest wall accessed Port-A-Cath and enteric
tube in similar position.

The heart size and mediastinal contours are unchanged. Aortic arch
calcification.

Left base atelectasis. No focal consolidation. No pulmonary edema.
No pleural effusion. No pneumothorax.

No acute osseous abnormality.
IMPRESSION: Endotracheal tube in appropriate position. Other lines and tubes
stable in position.

## 2020-12-06 IMAGING — CT CT HEAD W/O CM
4 series · 16 of 47 positions shown, 18 images · non-contrast
Comparison: [DATE] brain MRI.

CLINICAL DATA: Encephalopathy seizure. History of brain metastases.

EXAM:
CT HEAD WITHOUT CONTRAST
TECHNIQUE: Contiguous axial images were obtained from the base of the skull
through the vertex without intravenous contrast.

[Series 3: head without · axial · non-contrast · 0.40mm/px · z∈[-134,-14]mm · 7 of 33 slices shown, 9 images]
[im 5/33  brain]
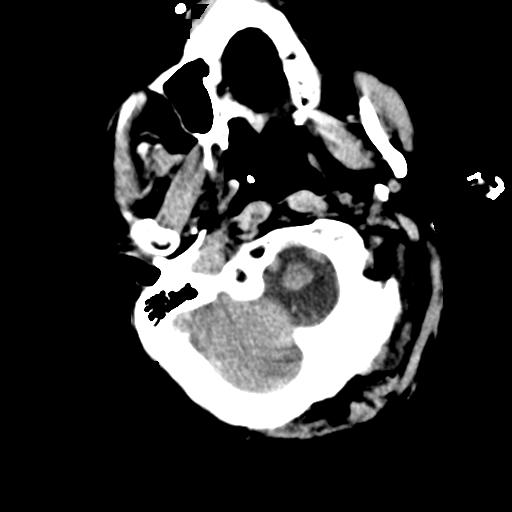
[im 5/33  bone]
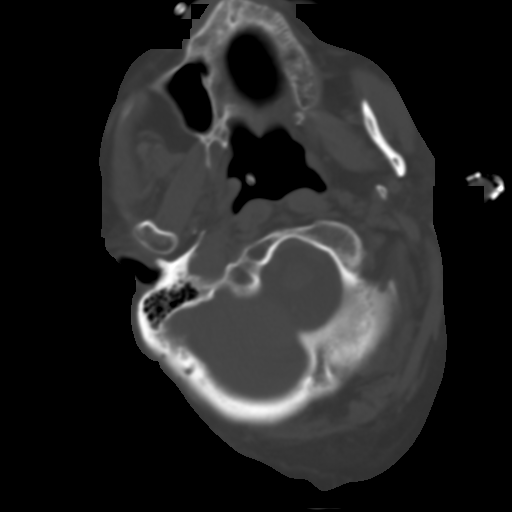
[im 9/33  brain]
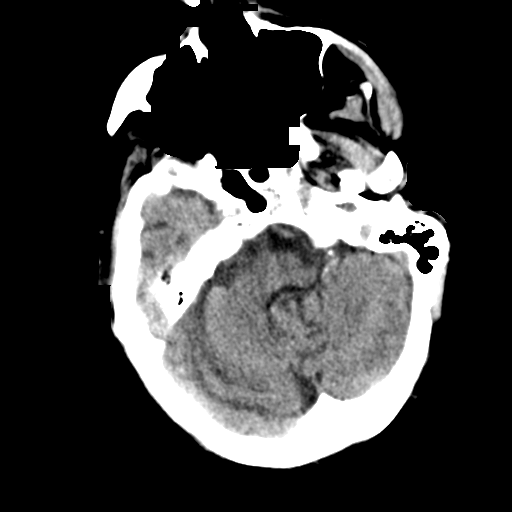
[im 13/33  brain]
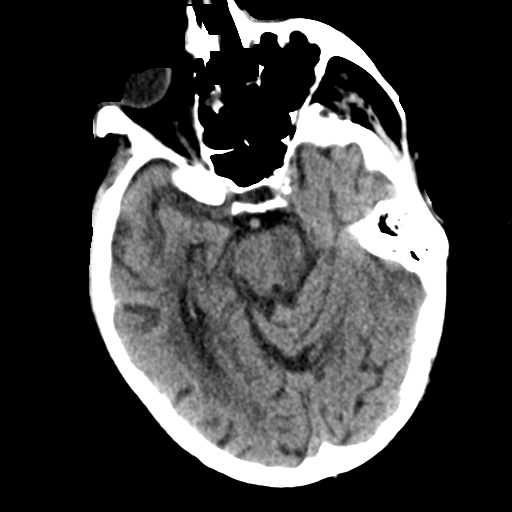
[im 17/33  brain]
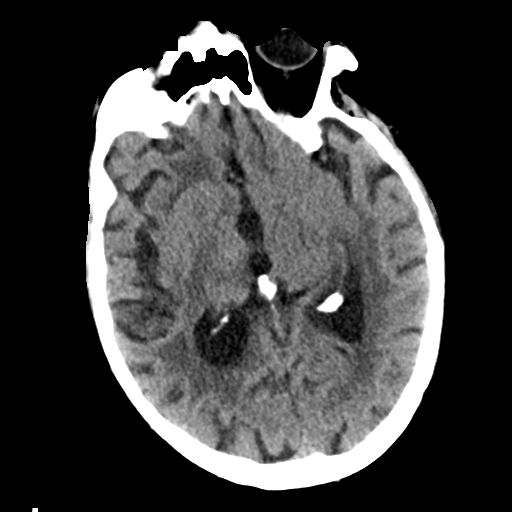
[im 21/33  brain]
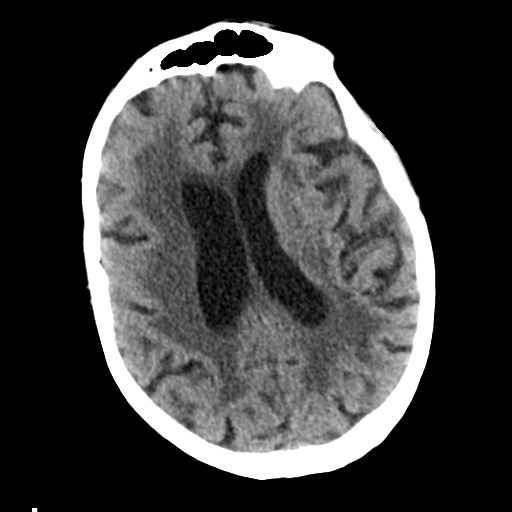
[im 21/33  bone]
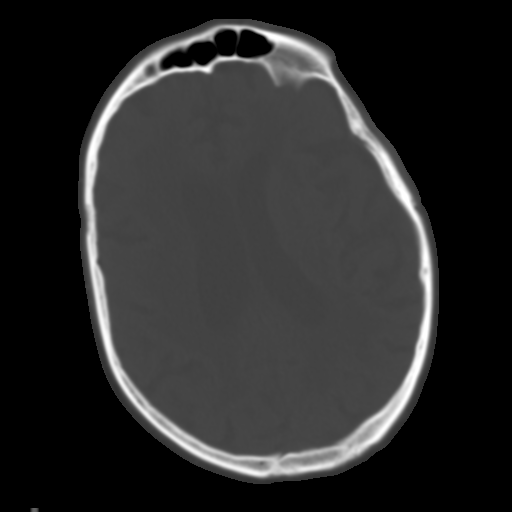
[im 25/33  brain]
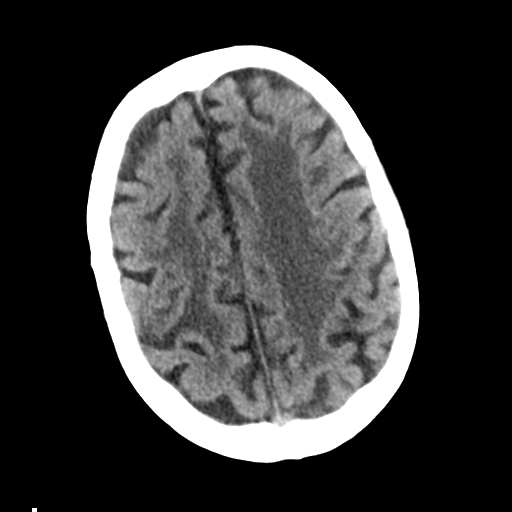
[im 29/33  brain]
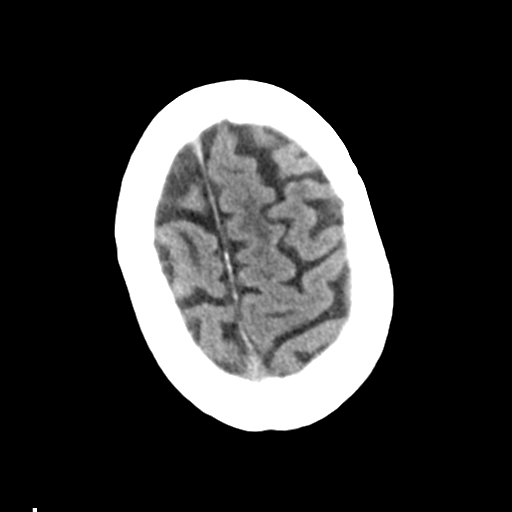

[Series 4: head bone · axial · 0.40mm/px · z∈[-138,-106]mm · 3 of 83 slices shown]
[im 9/83  bone]
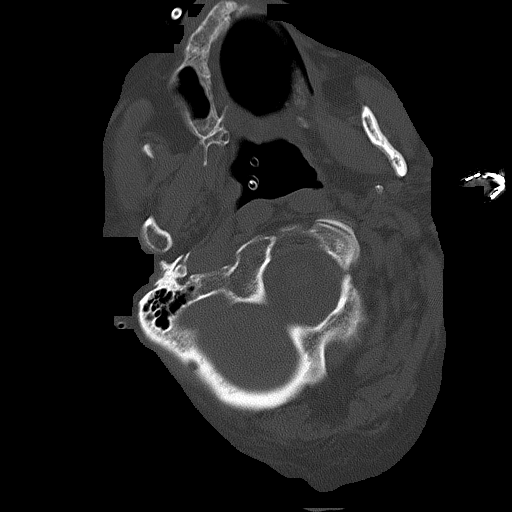
[im 17/83  bone]
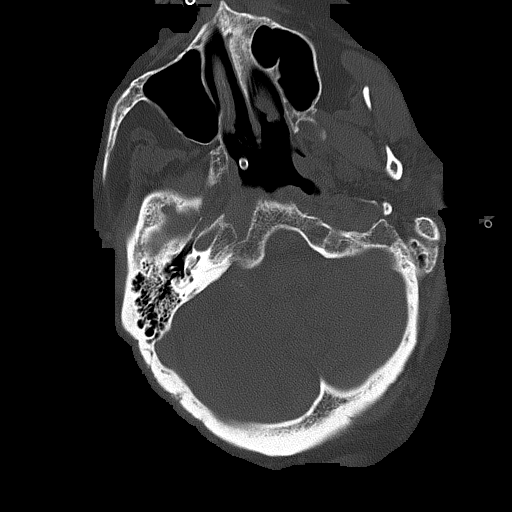
[im 25/83  bone]
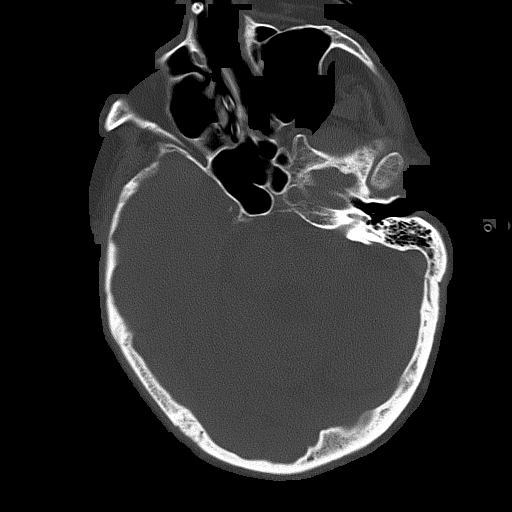

[Series 5: head without cor · coronal · non-contrast · 0.30mm/px · 3 of 71 slices shown]
[im 28/71  brain]
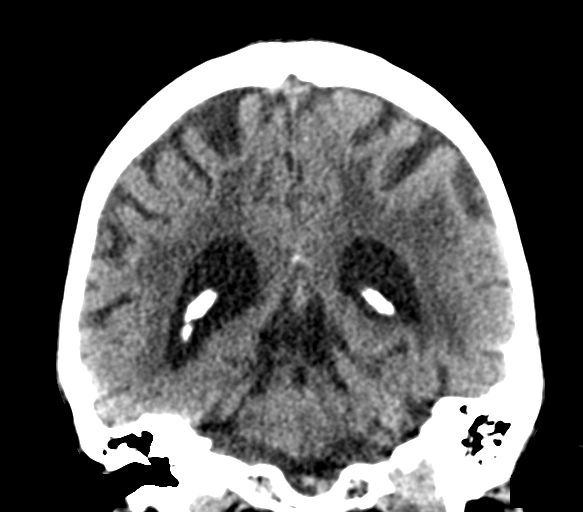
[im 33/71  brain]
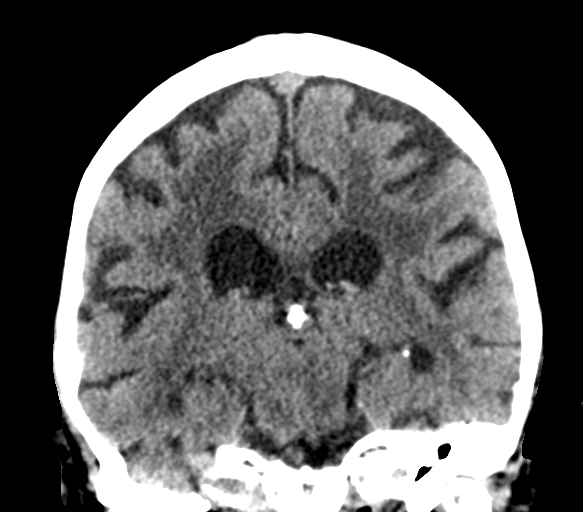
[im 38/71  brain]
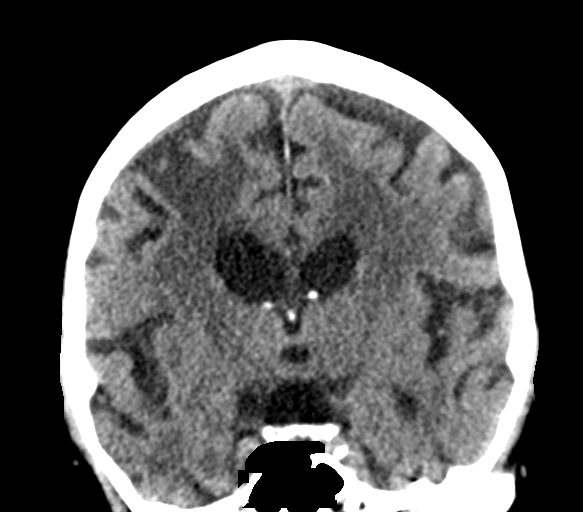

[Series 6: head without sag · sagittal · non-contrast · 0.32mm/px · 3 of 67 slices shown]
[im 26/67  brain]
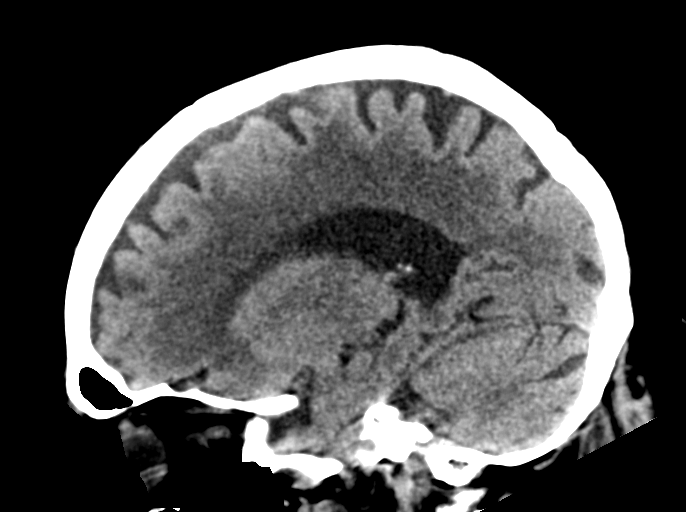
[im 34/67  brain]
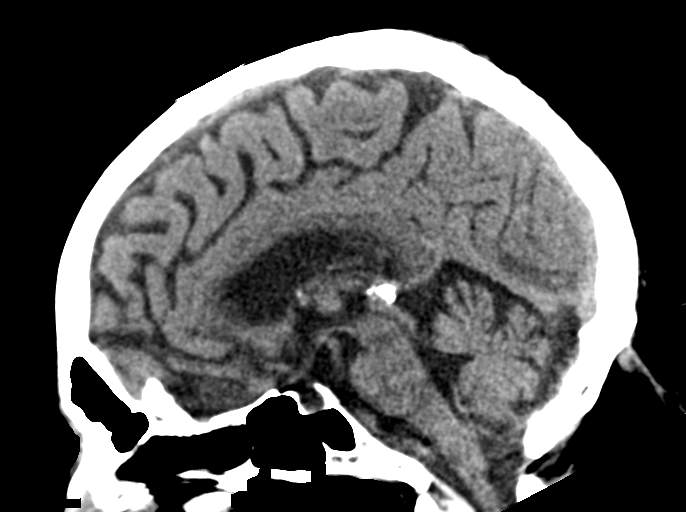
[im 42/67  brain]
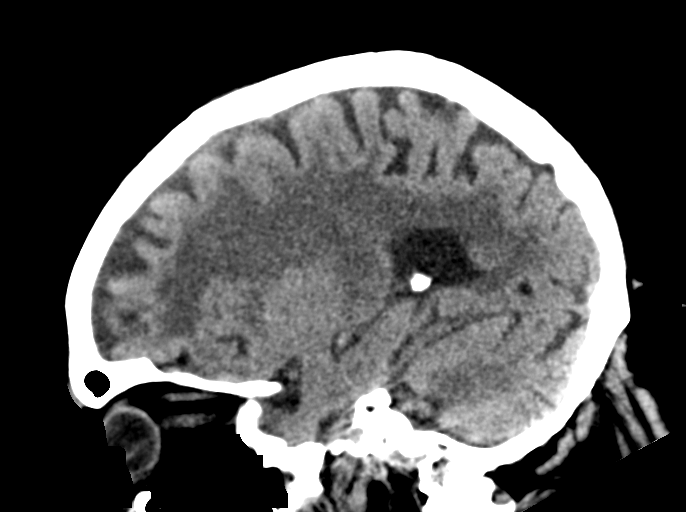

[16 of 47 positions shown; findings below may reference images not displayed]

FINDINGS: Brain: There is no mass, hemorrhage or extra-axial collection. There
is generalized atrophy without lobar predilection. Hypodensity of
the white matter is most commonly associated with chronic
microvascular disease.

Vascular: No abnormal hyperdensity of the major intracranial
arteries or dural venous sinuses. No intracranial atherosclerosis.

Skull: The visualized skull base, calvarium and extracranial soft
tissues are normal.

Sinuses/Orbits: No fluid levels or advanced mucosal thickening of
the visualized paranasal sinuses. No mastoid or middle ear effusion.
The orbits are normal.
IMPRESSION: 1. No acute intracranial abnormality.
2. Generalized atrophy and chronic microvascular ischemia.

## 2020-12-06 MED ORDER — NOREPINEPHRINE 4 MG/250ML-% IV SOLN
INTRAVENOUS | Status: AC
  Filled 2020-12-06: qty 250

## 2020-12-06 MED ORDER — NOREPINEPHRINE 4 MG/250ML-% IV SOLN
0.0000 ug/min | INTRAVENOUS | Status: DC
  Administered 2020-12-06: 5 ug/min via INTRAVENOUS

## 2020-12-06 MED ORDER — FENTANYL CITRATE (PF) 100 MCG/2ML IJ SOLN: 50.0000 ug | Freq: Once | INTRAMUSCULAR | Status: AC

## 2020-12-06 MED ORDER — PROPOFOL 1000 MG/100ML IV EMUL
INTRAVENOUS | Status: AC
  Filled 2020-12-06: qty 100

## 2020-12-06 MED ORDER — IPRATROPIUM-ALBUTEROL 0.5-2.5 (3) MG/3ML IN SOLN
3.0000 mL | RESPIRATORY_TRACT | Status: DC | PRN
  Administered 2020-12-06: 3 mL via RESPIRATORY_TRACT

## 2020-12-06 MED ORDER — CHLORHEXIDINE GLUCONATE 0.12% ORAL RINSE (MEDLINE KIT)
15.0000 mL | Freq: Two times a day (BID) | OROMUCOSAL | Status: DC
  Administered 2020-12-06 – 2020-12-10 (×9): 15 mL via OROMUCOSAL

## 2020-12-06 MED ORDER — FENTANYL CITRATE (PF) 100 MCG/2ML IJ SOLN: 50.0000 ug | INTRAMUSCULAR | Status: DC | PRN

## 2020-12-06 MED ORDER — ORAL CARE MOUTH RINSE
15.0000 mL | OROMUCOSAL | Status: DC
  Administered 2020-12-06 – 2020-12-07 (×12): 15 mL via OROMUCOSAL

## 2020-12-06 MED ORDER — FENTANYL CITRATE (PF) 100 MCG/2ML IJ SOLN
INTRAMUSCULAR | Status: AC
  Administered 2020-12-06: 50 ug via INTRAVENOUS
  Filled 2020-12-06: qty 2

## 2020-12-06 MED ORDER — PROPOFOL 1000 MG/100ML IV EMUL
5.0000 ug/kg/min | INTRAVENOUS | Status: DC
  Administered 2020-12-06: 5 ug/kg/min via INTRAVENOUS

## 2020-12-06 MED ORDER — MIDAZOLAM HCL 2 MG/2ML IJ SOLN
2.0000 mg | INTRAMUSCULAR | Status: DC | PRN
  Administered 2020-12-07: 2 mg via INTRAVENOUS
  Filled 2020-12-06: qty 2

## 2020-12-06 MED ORDER — DEXTROSE 50 % IV SOLN: 50.0000 mL | Freq: Once | INTRAVENOUS | Status: DC

## 2020-12-06 MED ORDER — INSULIN ASPART 100 UNIT/ML IV SOLN: 10.0000 [IU] | Freq: Once | INTRAVENOUS | Status: DC

## 2020-12-06 MED ORDER — ROCURONIUM BROMIDE 10 MG/ML (PF) SYRINGE
PREFILLED_SYRINGE | INTRAVENOUS | Status: AC
  Filled 2020-12-06: qty 10

## 2020-12-06 MED ORDER — LEVETIRACETAM IN NACL 1000 MG/100ML IV SOLN
1000.0000 mg | Freq: Once | INTRAVENOUS | Status: AC
  Administered 2020-12-06: 1000 mg via INTRAVENOUS
  Filled 2020-12-06: qty 100

## 2020-12-06 MED ORDER — ETOMIDATE 2 MG/ML IV SOLN
INTRAVENOUS | Status: AC
  Filled 2020-12-06: qty 20

## 2020-12-06 MED ORDER — FENTANYL CITRATE (PF) 100 MCG/2ML IJ SOLN
50.0000 ug | INTRAMUSCULAR | Status: DC | PRN
  Administered 2020-12-06 – 2020-12-07 (×2): 50 ug via INTRAVENOUS
  Filled 2020-12-06: qty 2

## 2020-12-06 MED ORDER — ROCURONIUM BROMIDE 10 MG/ML (PF) SYRINGE
100.0000 mg | PREFILLED_SYRINGE | Freq: Once | INTRAVENOUS | Status: AC
  Administered 2020-12-06: 100 mg via INTRAVENOUS

## 2020-12-06 MED ORDER — ETOMIDATE 2 MG/ML IV SOLN
20.0000 mg | Freq: Once | INTRAVENOUS | Status: AC
  Administered 2020-12-06: 20 mg via INTRAVENOUS

## 2020-12-06 MED ORDER — SODIUM ZIRCONIUM CYCLOSILICATE 10 G PO PACK
10.0000 g | PACK | Freq: Once | ORAL | Status: AC
  Administered 2020-12-06: 10 g
  Filled 2020-12-06: qty 1

## 2020-12-06 NOTE — Procedures (Signed)
Intubation Procedure Note  Dietrick Barris  [id number]  04/17/1951  Date:12/06/20  Time:4:48 AM   Provider Performing:Elisha Mcgruder Duwayne Heck    Procedure: Intubation (61224)  Indication(s) Respiratory Failure  Consent Unable to obtain consent due to emergent nature of procedure.   Anesthesia Etomidate, Fentanyl, and Rocuronium   Time Out Verified patient identification, verified procedure, site/side was marked, verified correct patient position, special equipment/implants available, medications/allergies/relevant history reviewed, required imaging and test results available.   Sterile Technique Usual hand hygeine, masks, and gloves were used   Procedure Description Patient positioned in bed supine.  Sedation given as noted above.  Patient was intubated with endotracheal tube using Glidescope.  View was Grade 1 full glottis .  Number of attempts was 1.  Colorimetric CO2 detector was consistent with tracheal placement.   Complications/Tolerance None; patient tolerated the procedure well. Chest X-ray is ordered to verify placement.   EBL Minimal   Specimen(s) None

## 2020-12-06 NOTE — Progress Notes (Signed)
NAME:  Walter Hernandez, MRN:  [id number], DOB:  Aug 21, 1950, LOS: 5 ADMISSION DATE:  12/01/2020, CONSULTATION DATE:  6/28 REFERRING MD:  Regenia Skeeter MD, CHIEF COMPLAINT:  Seizure   History of Present Illness:  History from chart review.   Walter Hernandez is an 70 y.o. who presented to the [facility] ED via EMS with concern for seizure, admitted 6/28.   Mr  Walter Hernandez lives at home with his wife. 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  Per his wife, Mr. Walter Hernandez is a patient of Dr. Marin Olp and he was diagnosed with small cell lung cancer with mets to the brain and liver in December of 2020 and started chemotherapy in February of 2021. An MRI in February of 2022 showed worsening brain mets. He underwent salvage SRS on 2/16. He was admitted for seizures on 09/05/20. MRI in march of 2022 showed worsening brain mets again. He underwent SRS a second time on 10/05/2020. In his last visit with Dr. Marin Olp on 11/11/2020 chemotherapy was stopped due to the patient having some difficulty with the thearpy.  On 6/28 the patient had woken up at 7AM, expressed some fatigue, and desire to take a nap around 10 AM. A short time later he was confused, not following commands, and aphasic. EMS was called.  Presentation at [facility] was notable for left gaze preference, aphasia, left side weakness. A code stroke ws called and Neurology was consulted. NIHSS score was 20. CT head was negative for acute abnormality. MRI was negative for acute infarct. IV decadron was given due to history of brain mets. Presentation was then felt to be from seizure with post-ictal state. 1 MG ativan was given. EEG showed lateralized rhythmic delta activity in the left hemisphere. There was no clinical improvement post ativan so another 43m ativan along with 2066mVimpat, 200045meppra, and 9m37m fosphenytoin were given per documentation in the ED.   PCCM was consulted for admission and  consideration for intubation.   Extubated on 6/29.    Called by hospitalist this evening for worsening respiratory status and worsening mental status.  CT head, eeg done, cxr, abg done -work up thus far negative.   Significant Hospital Events: Including procedures, antibiotic start and stop dates in addition to other pertinent events   6/28 Presented to [facility] concern for seizure. CT head no acute abnormalities. MRI no infarction. EEG> ,Intubated 6/29 EEG was negative for seizures, patient tolerated pressure support trial, successfully extubated  Interim History / Subjective:  Patient became unresponsive last night with left gaze deviation and left-sided weakness.  Stat head CT was negative for acute findings He had labored breathing, requiring endotracheal intubation for inability to protect his airway EEG showed no active seizures  Objective   Blood pressure 126/78, pulse 91, temperature 98.6 F (37 C), temperature source Axillary, resp. rate 15, height _0  (1.702 m), weight 71.7 kg, SpO2 100 %.    Vent Mode: PRVC FiO2 (%):  [30 %-60 %] 30 % Set Rate:  [14 bmp] 14 bmp Vt Set:  [530 mL] 530 mL PEEP:  [5 cmH20] 5 cmH20 Plateau Pressure:  [12 cmH20-14 cmH20] 13 cmH20   Intake/Output Summary (Last 24 hours) at 12/06/2020 1258 Last data filed at 12/06/2020 0600 Gross per 24 hour  Intake 913.5 ml  Output 1400 ml  Net -486.5 ml   Filed Weights   12/04/20 1230 12/04/20 1300 12/05/20 0400  Weight: 65.8  kg 65.9 kg 71.7 kg    Examination:   Physical exam:   Physical exam: General: Acute on chronically ill-appearing male, orally intubated HEENT: [place]/AT, eyes anicteric.  ETT and OGT in place Neuro: Eyes closed, not following commands, antigravity on right side, flickering movement on the right upper extremity Chest: Coarse breath sounds, no wheezes or rhonchi Heart: Regular rate and rhythm, no murmurs or gallops Abdomen: Soft, nontender, nondistended, bowel sounds present Skin:  No rash     CT head:   FINDINGS: Brain: There is no mass, hemorrhage or extra-axial collection. There is generalized atrophy without lobar predilection. Hypodensity of the white matter is most commonly associated with chronic microvascular disease.   Vascular: No abnormal hyperdensity of the major intracranial arteries or dural venous sinuses. No intracranial atherosclerosis.   Skull: The visualized skull base, calvarium and extracranial soft tissues are normal.   Sinuses/Orbits: No fluid levels or advanced mucosal thickening of the visualized paranasal sinuses. No mastoid or middle ear effusion. The orbits are normal.   IMPRESSION: 1. No acute intracranial abnormality. 2. Generalized atrophy and chronic microvascular ischemia.  MRI Brain  IMPRESSION: There is some linear enhancement in the right mid brain. No enhancement in the medial thalamus. These findings could represent some combination of treatment effect and possibly recurrent disease in the mid brain.   There is enlargement of an enhancing focus within the left temporal lobe just inferior and lateral to the temporal horn of the lateral ventricle. This was present as a punctate focus of enhancement on the study of 2 months ago but today measures 6 x 7 mm. This is most likely a metastatic focus.  Diffusion imaging does not show any acute or subacute infarction.   Resolution of abnormal enhancement seen on the study of 2 months ago within the subinsular white matter on the right and along the cortical surface of the brain at the left posterior frontal vertex.   Enlargement of what was a punctate focus of enhancement in the left temporal lobe, now measuring 6 x 7 mm today, favored to represent a metastasis.   Recurrent linear enhancement in the mid brain on the right. Recurrent edema in the right mid brain and medial thalamus. No enhancement in the medial thalamus. These areas had normalized on the study  09/21/2020, were normal on the study of 04/09/2020 but showed striking enhancement on the study of 07/10/2020. Therefore, I wonder if these areas represent waxing and waning treatment effect.   Resolved Hospital Problem list     Assessment & Plan:  Seizures due to brain mets versus extensive white matter disease from radiation therapy Acute encephalopathy Patient became severely encephalopathic last night, was not responding Again he had left-sided weakness with left gaze deviation CT head stat was negative for acute findings EEG was done which was negative for active seizures Avoid sedation Continue Dilantin and Keppra Appreciate neurology recommendations  Acute Respiratory Failure with Hypoxia Continue lung protective ventilation I do not think patient can be safely extubated considering his mental status Titrate FiO2 and PEEP to maintain O2 sat 90-92%  Hyponatremia, Hypomagnesia  Hyponatremia 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  Normocytic Anemia- suspect chronic secondary to chemo Thrombocytopenia (plt 125) Cell counts are stable Continue to monitor  Metastatic lung cancer to brain and  liver, also remote h/o malignant melanoma.  Neuro oncology recommend no further work-up Palliative care is consulted Neurology is following  Best Practice (right click and "Reselect all SmartList Selections" daily)   Diet/type: Tube feeds DVT prophylaxis: Subcu Lovenox GI prophylaxis: PPI Lines: N/A Port accessed Foley:  N/A Code Status:  full code Last date of multidisciplinary goals of care discussion: 7/3 critical care team, neurology and palliative care met with the patient's wife, she kept saying it is very difficult decision to make, I explained that extubation and current mental  status is not possible.  She is leaning towards palliative extubation and proceeding with hospice care Palliative care to meet with her and other family members tomorrow Disposition: ICU     Total critical care time: 41 minutes  Performed by: Jacky Kindle   Critical care time was exclusive of separately billable procedures and treating other patients.   Critical care was necessary to treat or prevent imminent or life-threatening deterioration.   Critical care was time spent personally by me on the following activities: development of treatment plan with patient and/or surrogate as well as nursing, discussions with consultants, evaluation of patient's response to treatment, examination of patient, obtaining history from patient or surrogate, ordering and performing treatments and interventions, ordering and review of laboratory studies, ordering and review of radiographic studies, pulse oximetry and re-evaluation of patient's condition.   Jacky Kindle MD [facility] See Amion for pager If no response to pager, please call [phone] until 7pm After 7pm, Please call E-link [phone]

## 2020-12-06 NOTE — Procedures (Addendum)
Patient Name: Walter Hernandez  MRN: [id number]  Epilepsy Attending: Lora Havens  Referring Physician/Provider: Dr Donnetta Simpers Duration: 12/06/2020 0124 to 7/32022 1029   Patient history: 70 year old male with change in mentation. Patient noted to have elevated HR to 130s wth sonorous breathing with some L gaze deviation and wont move left side. This is a change from earlier when he was drowsy but would stick out his tongue when asked, was not moving his left side thou..  EEG to evaluate for seizures.   Level of alertness: Awake, asleep   AEDs during EEG study: Keppra, PHT, Propofol   Technical aspects: This EEG was obtained using a 10 lead EEG system positioned circumferentially without any parasagittal coverage (rapid EEG). Computer selected EEG is reviewed as  well as background features and all clinically significant events.   Description:  EEG showed continuous generalized 3 to 6 Hz theta-delta slowing. Hyperventilation and photic stimulation were not performed.      ABNORMALITY -Continuous slow, generalized   IMPRESSION: This limited ceribell EEG is suggestive of moderate diffuse encephalopathy, nonspecific etiology. No definite seizures or epileptiform discharges were seen during this time.   Rocquel Askren Barbra Sarks

## 2020-12-06 NOTE — Progress Notes (Signed)
Daily Progress Note   Patient Name: Walter Hernandez       Date: 12/06/2020 DOB: 1950-08-10  Age: 70 y.o. MRN#: [id number] Attending Physician: Jacky Kindle, MD Primary Care Physician: Lavone Orn, MD Admit Date: 12/01/2020  Reason for Consultation/Follow-up:  To discuss complex medical decision making related to patient's goals of care  Reviewed chart.  Discussed patient with Drs. Chand and Rory Percy.  Subjective: Met with patient's wife at bedside.  She is distraught after talking with both the CCM MD and Neurology MD.  She expresses that she has been given the choice of (1) comfort or (2) tracheostomy.   She explains that when Walter Hernandez was awake yesterday he told her he just wanted to go home.   She tells me she just wants to be able to lie in bed with him.  She has devoted her life to him and his care for the last 18+ months.  She can not bare losing him.  She tells me she will not be able to live in their home after he dies.  We talked about one way extubation.  Tracheostomy is not a good plan.  Ideally she wants to take him home - but she knows he may not be able to make it home.   We talked about the possibility of going home with the support of Hospice.   The plan was to optimize him as much as possible on the vent, while at the same time preparing home for his arrival.  Extubate, ensure he is stable enough for transport, and then get him home as quickly at possible.   We will think this thru and see if we can make it happen.  After meet with Walter Hernandez I talked with the [facility] Representative.  She raised the issues of IV seizure medications and the possibility that Walter Hernandez may seize at home.  Walter Hernandez would need a lot of support.  She suggested Hospice Faciilty [facility]) may offer the right amount of  support.     Assessment: Patient re-intubated after an episode of seizure? And hypotension last night.     Patient Profile/HPI:  70 y.o. 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 Patient was diagnosed with  small cell lung cancer with mets to the brain and liver in December of 2020 and started chemotherapy in February of 2021. An MRI in February of 2022 showed worsening brain mets. He underwent salvage SRS on 2/16. He was admitted for seizures on 09/05/20. MRI in march of 2022 showed worsening brain mets again. He underwent SRS a second time on 10/05/2020. In his last visit with Dr. Marin Olp on 11/11/2020 chemotherapy was stopped due to the patient having some difficulty with the thearpy. This admission, CT head was negative for acute abnormality and MRI was negative for acute infarct. Multiple medications given for seizure activity. Patient experienced acute respiratory failure and required intubation. PMT consulted to discuss [place].       Length of Stay: 5   Vital Signs: BP 109/75   Pulse 98   Temp 98.1 F (36.7 C) (Axillary)   Resp 17   Ht 5' 7"  (1.702 m)   Wt 71.7 kg   SpO2 100%   BMI 24.76 kg/m  SpO2: SpO2: 100 % O2 Device: O2 Device: Ventilator O2 Flow Rate: O2 Flow Rate (L/min): 2 L/min       Palliative Assessment/Data: 10%     Palliative Care Plan    Recommendations/Plan: Wife is agreeable to a 1 way ext[facility] to home with Hospice support.   However, she understands he may not leave the hospital. [facility] Representative to discuss [facility] with her as a possible option. Discussed with CCM - let us attempt to get disposition planned and set up - extubate at the optimal time. Changed code status to limited code as he is intubated.  Code Status:  Limited code  Prognosis:  Hours - Days   Discharge Planning: To Be Determined -  Hospital death, vs Hospice Facility, vs Home with Hospice   Care plan was discussed with attending MD, RN, Wife, Hospice.  Thank you for allowing the Palliative Medicine Team to assist in the care of this patient.  Total time spent:  60 min.     Greater than 50%  of this time was spent counseling and coordinating care related to the above assessment and plan.  Florentina Jenny, PA-C Palliative Medicine  Please contact Palliative MedicineTeam phone at [phone] for questions and concerns between 7 am - 7 pm.   Please see AMION for individual provider pager numbers.

## 2020-12-06 NOTE — Progress Notes (Signed)
SLP Cancellation Note  Patient Details Name: Walter Hernandez MRN: [id number] DOB: [birth year]   Cancelled treatment:       Reason Eval/Treat Not Completed: Medical issues which prohibited therapy. Patient was emergently intubated 7/3 at 0448.  Sonia Baller, MA, CCC-SLP Speech Therapy [facility]

## 2020-12-06 NOTE — Progress Notes (Signed)
[facility] PROGRESS NOTE  Patient: Walter Hernandez QDU:[id number]   PCP: Lavone Orn, MD DOB: [birth year]   DOA: 12/01/2020   DOS: 12/06/2020    Around midnight becomes minimally responsive.  Per RN left gaze deviation and will not move left side.  No movement on left upper extremity as per Neurology exam. CT head without any evidence of acute stroke.  Loaded with IV Keppra 1 g again.  Patient was intubated for airway protection.  Post intubation became hypotensive.  Care transferred to [facility] service. Appreciate assistance.  Author: Berle Mull, MD [facility] 12/06/2020 9:21 AM   If 7PM-7AM, please contact night-coverage at [email or web address]

## 2020-12-06 NOTE — Progress Notes (Signed)
Received a page from bedside RN regarding the patient being minimally responsive.  Came to bedside.  Patient has sonorous breathing with poor air entry.  CXR non acute.  ABG 7.430/38.1/96 on 2L.  Was extubated on 6/29.  PCCM consulted.  Patient is now intubated.  Patient's wife has been updated.

## 2020-12-06 NOTE — Progress Notes (Signed)
Brief Neuro Note:  I was notified by RN about change in mentation. Patient noted to have elevated HR to 130s wth sonorous breathing with some L gaze deviation and wont move left side. This is a change from earlier when he was drowsy but would stick out his tongue when asked, was not moving his left side thou.  On my evaluation, no gaze deviation, pupils 29mm, equal round and reactive BL. Weak cough, weak gag. No response to voice or loud clap, grimaces to pain and brings his RUE closer to his face. He appears to have sonorous respirations and is breahting through his mouth. He withdraws his RUE and Bilateral lower extremities but no movement in LUE. Exam is significantly different than what was noted by neurology earlier when he was awake, oriented to self with moderately dysarthric speech. He was noted to have 4/5 strength in LUE with a 4+/5 strength in RUE.  Recs: - Will get STAT CT Head without contrast - Will give Keppra 1000mg  IV once - Will hook up to ceribell EEG overnight.  RN also notified the Hospitalist team.  Donnetta Simpers [facility] Pager Number [phone]

## 2020-12-06 NOTE — Progress Notes (Signed)
Neurology Progress Note   S:// Eventful night-got intubated overnight. According to Dr. Sammuel Hines a change in mentation, noted to have elevated heart rate in the 130s with sonorous breathing with left gaze deviation without moving the left side. On his evaluation.  And no gaze deviation, equal and round pupils with weak cough and gag withdrawal response to voice or loud clap with grimacing to pain and movement of right upper extremity with weak left side. Sta rapid CT hooked up-with no seizures. Keppra 1000 IV once given in addition to ongoing doses of Keppra.   O:// Current vital signs: BP 109/75   Pulse 98   Temp 98.1 F (36.7 C) (Axillary)   Resp 17   Ht _0  (1.702 m)   Wt 71.7 kg   SpO2 100%   BMI 24.76 kg/m  Vital signs in last 24 hours: Temp:  [97.8 F (36.6 C)-99.2 F (37.3 C)] 98.1 F (36.7 C) (07/03 0800) Pulse Rate:  [79-126] 98 (07/03 1113) Resp:  [14-33] 17 (07/03 1113) BP: (57-191)/(39-126) 109/75 (07/03 1100) SpO2:  [86 %-100 %] 100 % (07/03 1113) FiO2 (%):  [30 %-60 %] 30 % (07/03 1113) General: Intubated on minimal sedation HEENT: Normocephalic/atraumatic Cardiovascular: Regular rate rhythm Abdomen nondistended nontender Neurological exam He is intubated, on minimal sedation Does not open eyes to voice Does not follow commands Nonverbal due to the endotracheal tube Cranial nerves: Pupils are equal, sluggishly reactive, no gaze preference or deviation, difficult to ascertain facial symmetry Motor exam: On noxious stimulation localizes with right upper extremity and lower extremity, moves left lower extremity some to noxious stimulation.  Minimal movement of the left lower extremity to noxious stimulation. Sensory exam: As above  Medications  Current Facility-Administered Medications:    acetaminophen (TYLENOL) tablet 650 mg, 650 mg, Per Tube, Q6H PRN, Lavina Hamman, MD, 650 mg at 12/05/20 2031   chlorhexidine (PERIDEX) 0.12 % solution 15 mL, 15 mL,  Mouth Rinse, BID, Jacky Kindle, MD, 15 mL at 12/06/20 1034   chlorhexidine gluconate (MEDLINE KIT) (PERIDEX) 0.12 % solution 15 mL, 15 mL, Mouth Rinse, BID, Anders Simmonds, MD, 15 mL at 12/06/20 0802   Chlorhexidine Gluconate Cloth 2 % PADS 6 each, 6 each, Topical, Daily, Jacky Kindle, MD, 6 each at 12/05/20 1145   dexamethasone (DECADRON) injection 4 mg, 4 mg, Intravenous, Q8H, Ennever, Rudell Cobb, MD, 4 mg at 12/06/20 0528   dextrose 50 % solution 50 mL, 50 mL, Intravenous, Once, Collier Bullock, MD   docusate sodium (COLACE) capsule 100 mg, 100 mg, Oral, BID PRN, Estill Cotta, NP   enoxaparin (LOVENOX) injection 40 mg, 40 mg, Subcutaneous, Q24H, Chand, Sudham, MD, 40 mg at 12/06/20 1019   etomidate (AMIDATE) 2 MG/ML injection, , , ,    feeding supplement (OSMOLITE 1.5 CAL) liquid 1,000 mL, 1,000 mL, Per Tube, Continuous, Jacky Kindle, MD, Stopped at 12/06/20 0100   feeding supplement (PROSource TF) liquid 45 mL, 45 mL, Per Tube, TID, Jacky Kindle, MD, 45 mL at 12/06/20 1019   fentaNYL (SUBLIMAZE) injection 50 mcg, 50 mcg, Intravenous, Q1H PRN, Dellinger, Marianne L, PA-C   insulin aspart (novoLOG) injection 0-9 Units, 0-9 Units, Subcutaneous, Q4H, Lavina Hamman, MD, 2 Units at 12/05/20 2030   insulin aspart (novoLOG) injection 10 Units, 10 Units, Intravenous, Once, Collier Bullock, MD   ipratropium-albuterol (DUONEB) 0.5-2.5 (3) MG/3ML nebulizer solution 3 mL, 3 mL, Nebulization, Q4H PRN, Hall, Carole N, DO, 3 mL at 12/06/20 0215   levETIRAcetam (KEPPRA) IVPB 500  mg/100 mL premix, 500 mg, Intravenous, Q12H, Amie Portland, MD, Last Rate: 400 mL/hr at 12/06/20 1030, 500 mg at 12/06/20 1030   MEDLINE mouth rinse, 15 mL, Mouth Rinse, q12n4p, Chand, Currie Paris, MD, 15 mL at 12/05/20 1616   MEDLINE mouth rinse, 15 mL, Mouth Rinse, 10 times per day, Anders Simmonds, MD, 15 mL at 12/06/20 1020   midazolam (VERSED) injection 2 mg, 2 mg, Intravenous, Q4H PRN, Anders Simmonds, MD   multivitamin  with minerals tablet 1 tablet, 1 tablet, Per Tube, Daily, Jacky Kindle, MD, 1 tablet at 12/06/20 1019   mupirocin ointment (BACTROBAN) 2 % 1 application, 1 application, Nasal, BID, Jacky Kindle, MD, 1 application at 00/37/04 1027   norepinephrine (LEVOPHED) 30m in 2517mpremix infusion, 0-40 mcg/min, Intravenous, Titrated, SoAnders SimmondsMD, Last Rate: 18.75 mL/hr at 12/06/20 0600, 5 mcg/min at 12/06/20 0600   pantoprazole (PROTONIX) injection 40 mg, 40 mg, Intravenous, QHS, GeEstill CottaNP, 40 mg at 12/05/20 2134   phenytoin (DILANTIN) injection 100 mg, 100 mg, Intravenous, Q8H, ToAnibal Henderson, NP, 100 mg at 12/06/20 0528   polyethylene glycol (MIRALAX / GLYCOLAX) packet 17 g, 17 g, Oral, Daily PRN, GeEstill CottaNP   rocuronium bromide 100 MG/10ML SOSY, , , ,    sodium chloride flush (NS) 0.9 % injection 10-40 mL, 10-40 mL, Intracatheter, Q12H, Chand, Sudham, MD, 20 mL at 12/05/20 2134   sodium chloride flush (NS) 0.9 % injection 10-40 mL, 10-40 mL, Intracatheter, PRN, ChJacky KindleMD  Facility-Administered Medications Ordered in Other Encounters:    sodium chloride flush (NS) 0.9 % injection 10 mL, 10 mL, Intravenous, PRN, EnVolanda NapoleonMD, 10 mL at 10/21/19 0912 Labs CBC    Component Value Date/Time   WBC 9.4 12/05/2020 0551   RBC 3.19 (L) 12/05/2020 0551   HGB 10.2 (L) 12/06/2020 0411   HGB 8.5 (L) 11/24/2020 0830   HGB 16.1 01/30/2017 0804   HCT 30.0 (L) 12/06/2020 0411   HCT 44.9 01/30/2017 0804   PLT 125 (L) 12/05/2020 0551   PLT 97 (L) 11/24/2020 0830   PLT 186 01/30/2017 0804   MCV 98.1 12/05/2020 0551   MCV 101 (H) 01/30/2017 0804   MCH 32.3 12/05/2020 0551   MCHC 32.9 12/05/2020 0551   RDW 18.3 (H) 12/05/2020 0551   RDW 11.8 01/30/2017 0804   LYMPHSABS 0.7 12/04/2020 1030   LYMPHSABS 1.8 01/30/2017 0804   MONOABS 0.9 12/04/2020 1030   EOSABS 0.0 12/04/2020 1030   EOSABS 0.1 01/30/2017 0804   BASOSABS 0.0 12/04/2020 1030   BASOSABS 0.0  01/30/2017 0804    CMP     Component Value Date/Time   NA 137 12/06/2020 0411   NA 142 01/30/2017 0804   K 5.5 (H) 12/06/2020 0411   K 4.1 01/30/2017 0804   CL 102 12/06/2020 0309   CL 105 01/25/2016 0926   CO2 26 12/06/2020 0309   CO2 29 01/30/2017 0804   GLUCOSE 113 (H) 12/06/2020 0309   GLUCOSE 105 01/30/2017 0804   GLUCOSE 113 01/25/2016 0926   BUN 34 (H) 12/06/2020 0309   BUN 6.9 (L) 01/30/2017 0804   CREATININE 1.89 (H) 12/06/2020 0309   CREATININE 1.53 (H) 11/24/2020 0830   CREATININE 0.8 01/30/2017 0804   CALCIUM 8.6 (L) 12/06/2020 0309   CALCIUM 10.5 (H) 01/30/2017 0804   PROT 5.4 (L) 12/04/2020 1030   PROT 7.9 01/30/2017 0804   ALBUMIN 2.8 (L) 12/04/2020 1030   ALBUMIN 4.5 01/30/2017  0804   AST 11 (L) 12/04/2020 1030   AST 13 (L) 11/24/2020 0830   AST 34 01/30/2017 0804   ALT 8 12/04/2020 1030   ALT 7 11/24/2020 0830   ALT 18 01/30/2017 0804   ALKPHOS 58 12/04/2020 1030   ALKPHOS 79 01/30/2017 0804   BILITOT 0.4 12/04/2020 1030   BILITOT 0.4 11/24/2020 0830   BILITOT 1.49 (H) 01/30/2017 0804   GFRNONAA 37 (L) 12/06/2020 0309   GFRNONAA 48 (L) 11/24/2020 0830   GFRAA >60 03/02/2020 1135   Rapid EEG with diffuse encephalopathy of nonspecific etiology  Imaging I have reviewed images in epic and the results pertinent to this consultation are: CT head overnight with no new changes.  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 Imaging negative for stroke but revealed new metastases-1 in the brainstem and 1 in the left temporal lobe with resolution of the prior seen metastases. EEG concerning for left hemispheric dysfunction with lateralized rhythmic delta activity in the left hemisphere, which improved with antiepileptic treatment. Clinical exam improved up until  yesterday when he had sudden decline again in his examination with gaze deviation and left-sided weakness possibly another seizure/status epilepticus which had resolved by the time my colleague overnight saw him.  He received additional antiepileptic doses but had to be intubated as he was unable to protect his airway.  Comparison of MRIs from 2022 now shows advanced progression of leukoencephalopathy, in addition to the metastasis.  Seen by oncology: Recommend hospice but the wife is not ready to take that step yet.  Oncology does not recommend any further scanning for systemic malignancy.  They believe that although he has fought hard with his small cell lung cancer and the metastases but the disease becomes resistant to therapy and just progresses and that is where he is at this time.   Today I had a detailed discussion with his wife spending upwards of an hour and 15 minutes in trying to explain that at this point, with him going in and out of seizures and status, with his extremely poor neurological status, extremely aggressive cancer with multiple sites of metastases and leukoencephalopathy postradiation, there will be no benefit in remaining fixated with life-prolonging measures. I tried to elicit the values that he would have wanted and his wishes and his wife on multiple occasions said that she knows that she has to make the decision of making him comfortable and to the effect of pursuing comfort measures but she feels that it is a very hard decision to make given that they have been married a long time and she is unable to imagine her life without him.  At this point, I told her that given what the oncology, neuro-oncology team has done with him thus far and he has gone through, with the plan going forward with no chemotherapy or treatments for his cancer, it is time that they consider comfort measures only.  She is amenable to talking to palliative medicine and will likely make that decision  in a day or 2.  She just does not want to be rushed into this decision which I think is very reasonable.  Impression: Metastatic lung cancer to the brain and liver New brain metastases Extensive leukoencephalopathy-likely postradiation Seizures, status epilepticus-resolved.  Recommendations: Continue current antiepileptic and steroid regimen. I would  encourage continuing palliative medicine conversations as he is according to you and his oncologist an appropriate candidate for hospice at this time. I do not have any further neurological recommendations at this time. Continuous EEG can be discontinued-I unhooked the rapid EEG at the bedside. No further inpatient neurological recommendations at this time Plan discussed with Dr. Tacy Learn and palliative medicine APP Florentina Jenny on the unit.   -- Amie Portland, MD Neurologist [facility] Pager: [phone]   CRITICAL CARE ATTESTATION Performed by: Amie Portland, MD Total critical care time: 95 minutes Critical care time was exclusive of separately billable procedures and treating other patients and/or supervising APPs/Residents/Students Critical care was necessary to treat or prevent imminent or life-threatening deterioration due to brain metastasis, seizures This patient is critically ill and at significant risk for neurological worsening and/or death and care requires constant monitoring. Critical care was time spent personally by me on the following activities: development of treatment plan with patient and/or surrogate as well as nursing, discussions with consultants, evaluation of patient's response to treatment, examination of patient, obtaining history from patient or surrogate, ordering and performing treatments and interventions, ordering and review of laboratory studies, ordering and review of radiographic studies, pulse oximetry, re-evaluation of patient's condition, participation in multidisciplinary rounds and  medical decision making of high complexity in the care of this patient.

## 2020-12-06 NOTE — Progress Notes (Signed)
Patient HR sustained in 130s and snoring respirations, RR 30s and newly lethargic. MD paged, RN awaiting orders.

## 2020-12-06 NOTE — Progress Notes (Signed)
NAME:  Walter Hernandez, MRN:  [id number], DOB:  01/25/1951, LOS: 5 ADMISSION DATE:  12/01/2020, CONSULTATION DATE:  6/28 REFERRING MD:  Regenia Skeeter MD, CHIEF COMPLAINT:  Seizure   History of Present Illness:  History from chart review.   Walter Hernandez is an 70 y.o. who presented to the [facility] ED via EMS with concern for seizure, admitted 6/28.   Walter  Hernandez lives at home with his wife. 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  Per his wife, Walter. Hernandez is a patient of Walter Hernandez and he was diagnosed with small cell lung cancer with mets to the brain and liver in December of 2020 and started chemotherapy in February of 2021. An MRI in February of 2022 showed worsening brain mets. He underwent salvage SRS on 2/16. He was admitted for seizures on 09/05/20. MRI in march of 2022 showed worsening brain mets again. He underwent SRS a second time on 10/05/2020. In his last visit with Walter Hernandez on 11/11/2020 chemotherapy was stopped due to the patient having some difficulty with the thearpy.  On 6/28 the patient had woken up at 7AM, expressed some fatigue, and desire to take a nap around 10 AM. A short time later he was confused, not following commands, and aphasic. EMS was called.  Presentation at [facility] was notable for left gaze preference, aphasia, left side weakness. A code stroke ws called and Neurology was consulted. NIHSS score was 20. CT head was negative for acute abnormality. MRI was negative for acute infarct. IV decadron was given due to history of brain mets. Presentation was then felt to be from seizure with post-ictal state. 1 MG ativan was given. EEG showed lateralized rhythmic delta activity in the left hemisphere. There was no clinical improvement post ativan so another 2mg  ativan along with 200mg  Vimpat, 2000mg  keppra, and 20mg /kg fosphenytoin were given per documentation in the ED.   PCCM was consulted for admission and  consideration for intubation.   Extubated on 6/29.    Called by hospitalist this evening for worsening respiratory status and worsening mental status.  CT head, eeg done, cxr, abg done -work up thus far negative.   Significant Hospital Events: Including procedures, antibiotic start and stop dates in addition to other pertinent events   6/28 Presented to [facility] with concern for seizure. CT head no acute abnormalities. MRI no infarction. EEG> ,Intubated 6/29 EEG was negative for seizures, patient tolerated pressure support trial, successfully extubated  Interim History / Subjective:    Objective   Blood pressure 137/81, pulse (!) 102, temperature 99.2 F (37.3 C), temperature source Axillary, resp. rate 19, height 5\' 7"  (1.702 m), weight 71.7 kg, SpO2 100 %.    Vent Mode: PRVC FiO2 (%):  [60 %] 60 % Set Rate:  [14 bmp] 14 bmp Vt Set:  [530 mL] 530 mL PEEP:  [5 cmH20] 5 cmH20 Plateau Pressure:  [14 cmH20] 14 cmH20   Intake/Output Summary (Last 24 hours) at 12/06/2020 0344 Last data filed at 12/06/2020 0000 Gross per 24 hour  Intake 1430 ml  Output 1450 ml  Net -20 ml   Filed Weights   12/04/20 1230 12/04/20 1300 12/05/20 0400  Weight: 65.8 kg 65.9 kg 71.7 kg    Examination:   Physical exam: Tachypnea, upper airway sounds. Increased wob  General: Acute on chronically ill-appearing male, lying in the bed, stirs to sternal rub but does not follow commands,  eyes open, only briefly tracks  HEENT: [place]/AT, eyes anicteric.  ETT and OGT in place Neuro:  eyes opens with vocal stimuli, does not follow commands, Chest: Coarse breath sounds, no wheezes, rhonchi or crackles Heart: Regular rate and rhythm, no murmurs or gallops Abdomen: Soft, nontender, nondistended, bowel sounds present Skin: No rash   CT head:   FINDINGS: Brain: There is no mass, hemorrhage or extra-axial collection. There is generalized atrophy without lobar predilection. Hypodensity of the white matter is most  commonly associated with chronic microvascular disease.   Vascular: No abnormal hyperdensity of the major intracranial arteries or dural venous sinuses. No intracranial atherosclerosis.   Skull: The visualized skull base, calvarium and extracranial soft tissues are normal.   Sinuses/Orbits: No fluid levels or advanced mucosal thickening of the visualized paranasal sinuses. No mastoid or middle ear effusion. The orbits are normal.   IMPRESSION: 1. No acute intracranial abnormality. 2. Generalized atrophy and chronic microvascular ischemia.  MRI Brain  IMPRESSION: There is some linear enhancement in the right mid brain. No enhancement in the medial thalamus. These findings could represent some combination of treatment effect and possibly recurrent disease in the mid brain.   There is enlargement of an enhancing focus within the left temporal lobe just inferior and lateral to the temporal horn of the lateral ventricle. This was present as a punctate focus of enhancement on the study of 2 months ago but today measures 6 x 7 mm. This is most likely a metastatic focus.  Diffusion imaging does not show any acute or subacute infarction.   Resolution of abnormal enhancement seen on the study of 2 months ago within the subinsular white matter on the right and along the cortical surface of the brain at the left posterior frontal vertex.   Enlargement of what was a punctate focus of enhancement in the left temporal lobe, now measuring 6 x 7 mm today, favored to represent a metastasis.   Recurrent linear enhancement in the mid brain on the right. Recurrent edema in the right mid brain and medial thalamus. No enhancement in the medial thalamus. These areas had normalized on the study 09/21/2020, were normal on the study of 04/09/2020 but showed striking enhancement on the study of 07/10/2020. Therefore, I wonder if these areas represent waxing and waning treatment effect.   Resolved  Hospital Problem list     Assessment & Plan:  Acute encephalopathy:  worsened AMS this evening.   Unclear cause.  Possible seizures and post ictal?  Intubated for airway protection.  Neuro aware and following.  Ceribell in place.  No active seizure now.  Keppra and dilantin.   Post intubation hypotension Briefly hypotensive and on levophed, fluid bolus.  Resolving.   Acute Respiratory Failure with Hypoxia Saturation good on O2.   Upper airway sounds, tachypnea, shallow.   Hyponatremia, Hypomagnesia  SIADH secondary to brain mets. NA 134>133. Appears to be chronically hyponatremic per chart review. MG 1.0 Continue aggressive electrolyte supplement  AKI on CKD 3a Cr increased tonight up to 1.89 Closely monitor serum creatinine,  Avoid neprotoxic drugs as possible.  Normocytic Anemia- suspect chronic secondary to chemo Thrombocytopenia (plt 125) Cell counts are stable Continue to monitor  Metastatic lung cancer to brain and liver, also remote h/o malignant melanoma.  Neuro oncology recommend no further work-up Palliative care is consulted Neurology is following  Best Practice (right click and "Reselect all SmartList Selections" daily)   Diet/type: Tube feeds DVT prophylaxis: Subcu Lovenox GI prophylaxis: PPI Lines:  N/A Port accessed Foley:  N/A Code Status:  full code Last date of multidisciplinary goals of care discussion [wife wants to proceed with intubation] Disposition: ICU

## 2020-12-07 DIAGNOSIS — C3490 Malignant neoplasm of unspecified part of unspecified bronchus or lung: Secondary | ICD-10-CM | POA: Diagnosis not present

## 2020-12-07 DIAGNOSIS — G40901 Epilepsy, unspecified, not intractable, with status epilepticus: Secondary | ICD-10-CM | POA: Diagnosis not present

## 2020-12-07 DIAGNOSIS — Z66 Do not resuscitate: Secondary | ICD-10-CM

## 2020-12-07 DIAGNOSIS — R569 Unspecified convulsions: Secondary | ICD-10-CM | POA: Diagnosis not present

## 2020-12-07 DIAGNOSIS — C7931 Secondary malignant neoplasm of brain: Secondary | ICD-10-CM | POA: Diagnosis not present

## 2020-12-07 DIAGNOSIS — G9341 Metabolic encephalopathy: Secondary | ICD-10-CM | POA: Diagnosis not present

## 2020-12-07 LAB — BASIC METABOLIC PANEL
Anion gap: 6 (ref 5–15)
BUN: 44 mg/dL — ABNORMAL HIGH (ref 8–23)
CO2: 26 mmol/L (ref 22–32)
Calcium: 8.5 mg/dL — ABNORMAL LOW (ref 8.9–10.3)
Chloride: 106 mmol/L (ref 98–111)
Creatinine, Ser: 1.6 mg/dL — ABNORMAL HIGH (ref 0.61–1.24)
GFR, Estimated: 46 mL/min — ABNORMAL LOW (ref >60)
Glucose, Bld: 110 mg/dL — ABNORMAL HIGH (ref 70–99)
Potassium: 4.5 mmol/L (ref 3.5–5.1)
Sodium: 138 mmol/L (ref 135–145)

## 2020-12-07 LAB — CBC
HCT: 27.4 % — ABNORMAL LOW (ref 39.0–52.0)
Hemoglobin: 8.8 g/dL — ABNORMAL LOW (ref 13.0–17.0)
MCH: 32.6 pg (ref 26.0–34.0)
MCHC: 32.1 g/dL (ref 30.0–36.0)
MCV: 101.5 fL — ABNORMAL HIGH (ref 80.0–100.0)
Platelets: 107 10*3/uL — ABNORMAL LOW (ref 150–400)
RBC: 2.7 MIL/uL — ABNORMAL LOW (ref 4.22–5.81)
RDW: 18.6 % — ABNORMAL HIGH (ref 11.5–15.5)
WBC: 7.8 10*3/uL (ref 4.0–10.5)
nRBC: 0 % (ref 0.0–0.2)

## 2020-12-07 LAB — GLUCOSE, CAPILLARY
Glucose-Capillary: 104 mg/dL — ABNORMAL HIGH (ref 70–99)
Glucose-Capillary: 108 mg/dL — ABNORMAL HIGH (ref 70–99)
Glucose-Capillary: 112 mg/dL — ABNORMAL HIGH (ref 70–99)
Glucose-Capillary: 117 mg/dL — ABNORMAL HIGH (ref 70–99)
Glucose-Capillary: 128 mg/dL — ABNORMAL HIGH (ref 70–99)

## 2020-12-07 LAB — PHENYTOIN LEVEL, TOTAL: Phenytoin Lvl: 16.5 ug/mL (ref 10.0–20.0)

## 2020-12-07 LAB — ALBUMIN: Albumin: 2.7 g/dL — ABNORMAL LOW (ref 3.5–5.0)

## 2020-12-07 LAB — MAGNESIUM: Magnesium: 1.4 mg/dL — ABNORMAL LOW (ref 1.7–2.4)

## 2020-12-07 LAB — TRIGLYCERIDES: Triglycerides: 217 mg/dL — ABNORMAL HIGH (ref <150)

## 2020-12-07 MED ORDER — PHENYTOIN SODIUM 50 MG/ML IJ SOLN
100.0000 mg | Freq: Two times a day (BID) | INTRAMUSCULAR | Status: DC
  Administered 2020-12-08 – 2020-12-12 (×8): 100 mg via INTRAVENOUS
  Filled 2020-12-07 (×12): qty 2

## 2020-12-07 MED ORDER — GLYCOPYRROLATE 0.2 MG/ML IJ SOLN: 0.2000 mg | INTRAMUSCULAR | Status: DC | PRN

## 2020-12-07 MED ORDER — ACETAMINOPHEN 650 MG RE SUPP: 650.0000 mg | Freq: Four times a day (QID) | RECTAL | Status: DC | PRN

## 2020-12-07 MED ORDER — HALOPERIDOL LACTATE 2 MG/ML PO CONC
0.5000 mg | ORAL | Status: DC | PRN
  Filled 2020-12-07: qty 0.3

## 2020-12-07 MED ORDER — BIOTENE DRY MOUTH MT LIQD: 15.0000 mL | OROMUCOSAL | Status: DC | PRN

## 2020-12-07 MED ORDER — LORAZEPAM 2 MG/ML PO CONC: 1.0000 mg | ORAL | Status: DC | PRN

## 2020-12-07 MED ORDER — LORAZEPAM 1 MG PO TABS: 1.0000 mg | ORAL_TABLET | ORAL | Status: DC | PRN

## 2020-12-07 MED ORDER — ONDANSETRON 4 MG PO TBDP: 4.0000 mg | ORAL_TABLET | Freq: Four times a day (QID) | ORAL | Status: DC | PRN

## 2020-12-07 MED ORDER — FENTANYL CITRATE (PF) 100 MCG/2ML IJ SOLN
50.0000 ug | INTRAMUSCULAR | Status: DC | PRN
Start: 2020-12-07 — End: 2020-12-08

## 2020-12-07 MED ORDER — POLYVINYL ALCOHOL 1.4 % OP SOLN
1.0000 [drp] | Freq: Four times a day (QID) | OPHTHALMIC | Status: DC | PRN
  Filled 2020-12-07: qty 15

## 2020-12-07 MED ORDER — PHENYTOIN SODIUM 50 MG/ML IJ SOLN
75.0000 mg | Freq: Every day | INTRAMUSCULAR | Status: DC
  Administered 2020-12-08 – 2020-12-11 (×4): 75 mg via INTRAVENOUS
  Filled 2020-12-07: qty 1.5
  Filled 2020-12-07: qty 2
  Filled 2020-12-07 (×3): qty 1.5

## 2020-12-07 MED ORDER — ONDANSETRON HCL 4 MG/2ML IJ SOLN: 4.0000 mg | Freq: Four times a day (QID) | INTRAMUSCULAR | Status: DC | PRN

## 2020-12-07 MED ORDER — HALOPERIDOL LACTATE 5 MG/ML IJ SOLN: 0.5000 mg | INTRAMUSCULAR | Status: DC | PRN

## 2020-12-07 MED ORDER — HALOPERIDOL 0.5 MG PO TABS
0.5000 mg | ORAL_TABLET | ORAL | Status: DC | PRN
  Filled 2020-12-07: qty 1

## 2020-12-07 MED ORDER — ACETAMINOPHEN 325 MG PO TABS: 650.0000 mg | ORAL_TABLET | Freq: Four times a day (QID) | ORAL | Status: DC | PRN

## 2020-12-07 MED ORDER — LORAZEPAM 2 MG/ML IJ SOLN: 0.5000 mg | INTRAMUSCULAR | Status: DC | PRN

## 2020-12-07 MED ORDER — GLYCOPYRROLATE 1 MG PO TABS
1.0000 mg | ORAL_TABLET | ORAL | Status: DC | PRN
  Filled 2020-12-07: qty 1

## 2020-12-07 NOTE — Progress Notes (Signed)
NAME:  Walter Hernandez, MRN:  [id number], DOB:  [birth year], LOS: 6 ADMISSION DATE:  12/01/2020, CONSULTATION DATE:  6/28 REFERRING MD:  Regenia Skeeter MD, CHIEF COMPLAINT:  Seizure   History of Present Illness:  History from chart review.   Walter Hernandez is an 70 y.o. who presented to the [facility] ED via EMS with concern for seizure, admitted 6/28.   Mr  Hernandez lives at home with his wife. 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  Per his wife, Walter Hernandez is a patient of Dr. Marin Olp and he was diagnosed with small cell lung cancer with mets to the brain and liver in December of 2020 and started chemotherapy in February of 2021. An MRI in February of 2022 showed worsening brain mets. He underwent salvage SRS on 2/16. He was admitted for seizures on 09/05/20. MRI in march of 2022 showed worsening brain mets again. He underwent SRS a second time on 10/05/2020. In his last visit with Dr. Marin Olp on 11/11/2020 chemotherapy was stopped due to the patient having some difficulty with the thearpy.  On 6/28 the patient had woken up at 7AM, expressed some fatigue, and desire to take a nap around 10 AM. A short time later he was confused, not following commands, and aphasic. EMS was called.  Presentation at [facility] was notable for left gaze preference, aphasia, left side weakness. A code stroke ws called and Neurology was consulted. NIHSS score was 20. CT head was negative for acute abnormality. MRI was negative for acute infarct. IV decadron was given due to history of brain mets. Presentation was then felt to be from seizure with post-ictal state. 1 MG ativan was given. EEG showed lateralized rhythmic delta activity in the left hemisphere. There was no clinical improvement post ativan so another 41m ativan along with 2048mVimpat, 200029meppra, and 29m4m fosphenytoin were given per documentation in the ED.   PCCM was consulted for admission and  consideration for intubation.   Extubated on 6/29.    Called by hospitalist this evening for worsening respiratory status and worsening mental status.  CT head, eeg done, cxr, abg done -work up thus far negative.   Significant Hospital Events: Including procedures, antibiotic start and stop dates in addition to other pertinent events   6/28 [facility] for seizure. CT head no acute abnormalities. MRI no infarction. EEG> ,Intubated 6/29 EEG was negative for seizures, patient tolerated pressure support trial, successfully extubated  Interim History / Subjective:  No overnight issues Patient is awake and following commands Tolerating pressure support trial  Objective   Blood pressure 106/82, pulse 86, temperature 98.2 F (36.8 C), temperature source Axillary, resp. rate 14, height 5' 7"  (1.702 m), weight 71.7 kg, SpO2 100 %.    Vent Mode: PRVC FiO2 (%):  [30 %] 30 % Set Rate:  [14 bmp] 14 bmp Vt Set:  [530 mL] 530 mL PEEP:  [5 cmH20] 5 cmH20 Plateau Pressure:  [12 cmH20-17 cmH20] 12 cmH20   Intake/Output Summary (Last 24 hours) at 12/07/2020 0935 Last data filed at 12/07/2020 0500 Gross per 24 hour  Intake 225.45 ml  Output 1350 ml  Net -1124.55 ml   Filed Weights   12/04/20 1230 12/04/20 1300 12/05/20 0400  Weight: 65.8 kg 65.9 kg 71.7 kg    Examination:  Physical exam: General: Acute on chronically ill-appearing male, orally intubated HEENT: /AT, eyes anicteric.  ETT and  OGT in place Neuro: Eyes closed, opens with vocal stimuli, following commands.  Antigravity in all 4 extremities, left side is weaker than right Chest: Coarse breath sounds, no wheezes or rhonchi Heart: Regular rate and rhythm, no murmurs or gallops Abdomen: Soft, nontender, nondistended, bowel sounds present Skin: No rash   CT head:   FINDINGS: Brain: There is no mass, hemorrhage or extra-axial collection. There is generalized atrophy without lobar predilection. Hypodensity  of the white matter is most commonly associated with chronic microvascular disease.   Vascular: No abnormal hyperdensity of the major intracranial arteries or dural venous sinuses. No intracranial atherosclerosis.   Skull: The visualized skull base, calvarium and extracranial soft tissues are normal.   Sinuses/Orbits: No fluid levels or advanced mucosal thickening of the visualized paranasal sinuses. No mastoid or middle ear effusion. The orbits are normal.   IMPRESSION: 1. No acute intracranial abnormality. 2. Generalized atrophy and chronic microvascular ischemia.  MRI Brain  IMPRESSION: There is some linear enhancement in the right mid brain. No enhancement in the medial thalamus. These findings could represent some combination of treatment effect and possibly recurrent disease in the mid brain.   There is enlargement of an enhancing focus within the left temporal lobe just inferior and lateral to the temporal horn of the lateral ventricle. This was present as a punctate focus of enhancement on the study of 2 months ago but today measures 6 x 7 mm. This is most likely a metastatic focus.  Diffusion imaging does not show any acute or subacute infarction.   Resolution of abnormal enhancement seen on the study of 2 months ago within the subinsular white matter on the right and along the cortical surface of the brain at the left posterior frontal vertex.   Enlargement of what was a punctate focus of enhancement in the left temporal lobe, now measuring 6 x 7 mm today, favored to represent a metastasis.   Recurrent linear enhancement in the mid brain on the right. Recurrent edema in the right mid brain and medial thalamus. No enhancement in the medial thalamus. These areas had normalized on the study 09/21/2020, were normal on the study of 04/09/2020 but showed striking enhancement on the study of 07/10/2020. Therefore, I wonder if these areas represent waxing and waning  treatment effect.   Resolved Hospital Problem list     Assessment & Plan:  Seizures due to brain mets versus extensive white matter disease from radiation therapy Acute encephalopathy No more seizures EEG remain negative Off sedation Continue Dilantin and Keppra Appreciate neurology recommendations  Acute Respiratory Failure with Hypoxia Patient is tolerating pressure support trial, watch for respiratory distress Patient's wife to decide if this would be one-way extubation considering he has change in mental status intermittently with underlying history of metastatic tumor Titrate FiO2 and PEEP to maintain O2 sat 90-92%  Hyponatremia, Hypomagnesia  Hyponatremia 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  Normocytic Anemia- suspect chronic secondary to chemo Thrombocytopenia (plt 125) Cell counts are stable Continue to monitor  Metastatic lung cancer to brain and liver, also remote h/o malignant melanoma.  Neuro oncology recommend no further work-up Palliative care is consulted Neurology is following  Best Practice (right click and "Reselect all SmartList Selections" daily)   Diet/type: Tube feeds DVT prophylaxis: Subcu Lovenox GI prophylaxis: PPI Lines: N/A Port accessed Foley:  N/A Code Status:  full code Last date of multidisciplinary goals of care discussion: 7/3 critical care team, neurology and palliative care met with the patient's wife, she kept saying it is very difficult decision to make, I explained that extubation and current mental status is not possible.  She is leaning towards palliative extubation and proceeding with hospice care Palliative care to meet with her and other family members today or tomorrow Disposition: ICU     Total critical care time: 35 minutes  Performed by:  Jacky Kindle   Critical care time was exclusive of separately billable procedures and treating other patients.   Critical care was necessary to treat or prevent imminent or life-threatening deterioration.   Critical care was time spent personally by me on the following activities: development of treatment plan with patient and/or surrogate as well as nursing, discussions with consultants, evaluation of patient's response to treatment, examination of patient, obtaining history from patient or surrogate, ordering and performing treatments and interventions, ordering and review of laboratory studies, ordering and review of radiographic studies, pulse oximetry and re-evaluation of patient's condition.   Jacky Kindle MD [facility] See Amion for pager If no response to pager, please call [phone] until 7pm After 7pm, Please call E-link [phone]

## 2020-12-07 NOTE — Progress Notes (Addendum)
Daily Progress Note   Patient Name: Walter Hernandez       Date: 12/07/2020 DOB: 04/28/1951  Age: 70 y.o. MRN#: [id number] Attending Physician: Jacky Kindle, MD Primary Care Physician: Lavone Orn, MD Admit Date: 12/01/2020  Reason for Consultation/Follow-up:  To discuss complex medical decision making related to patient's goals of care  Subjective: Met at bedside with Walter Hernandez twice today.  The first time was with my PA student Walter Hernandez, and the second time was with Walter Hernandez's sister Walter Hernandez and brother-in-law Walter Hernandez.  Our first conversation centered around Walter Hernandez being medically ready for extubation and whether or not to reintubate.  Ultimately after some conversation Walter Hernandez agreed that she would not want him to have a tracheostomy and opted for DNR CODE STATUS.  We were to proceed with extubation without reintubation.  We also talked about home with hospice versus [facility] but ended up table in that decision for a meeting later in the day.  We met again at approximately 330 with her sister and brother-in-law.  This time we talked mostly about what would be needed at home for Walter Hernandez and Walter Hernandez comfort, versus going to [facility].  Walter Hernandez has a difficult time thinking of her precious husband and hospice.  She does not like the word hospice.  We laughed with each other and made a joke out of calling it anything else but hospice.  Finally Walter Hernandez said, I do not care which she call it you can call at the [facility].  From that point on we dubbed hospice "the [employer] ".  After talking in detail about support that would be needed, medical expertise that would be needed, medications that would be required, and the emotional toll that all of this would take on both Walter Hernandez and Walter Hernandez,  Walter Hernandez agreed that she could not fully care for Walter Hernandez at home as well as he could be cared for at [facility].  She agreed to request a bed at [facility] understanding that it may take several days before 1 is available.  We also talked about focusing on his comfort.    Walter Hernandez asked what if Walter Hernandez gets into trouble breathing in the middle of the night tonight?  I responded that we would give him medications to ease his breathing and make him  feel comfortable and he would pass.  I explained to her that I hope this does not happen and that they have several more happy days together but it could.  We were very clear in our discussions that Walter Hernandez likely does not have weeks rather he has days.  Walter Hernandez, and Walter Hernandez listened and understood.   Assessment: Very pleasant 70 year old gentleman with metastatic small cell lung cancer to the brain.  Admitted with seizures and has required Intubation multiple times.  Wife does not want a tracheostomy for her husband, she does not want him to come back to the ER again, she wants to support him in the best way possible and make him comfortable.  Patient Profile/HPI:   70 y.o. 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 Patient was diagnosed with small cell lung cancer with mets to the brain and liver in December of 2020 and started chemotherapy in February of 2021. An MRI in February of 2022 showed worsening brain mets. He underwent salvage SRS on 2/16. He was admitted for seizures on 09/05/20. MRI in march of 2022 showed worsening brain mets again. He underwent SRS a second time on 10/05/2020. In his last visit with Dr. Marin Olp on 11/11/2020 chemotherapy was stopped due to the patient having some difficulty with the thearpy. This admission, CT head was negative for acute abnormality and MRI was negative for acute infarct. Multiple medications given for  seizure activity. Patient experienced acute respiratory failure and required intubation. PMT consulted to discuss [place].       Length of Stay: 6   Vital Signs: BP 118/78   Pulse 89   Temp 98.2 F (36.8 C) (Oral)   Resp 17   Ht 5' 7"  (1.702 m)   Wt 71.7 kg   SpO2 100%   BMI 24.76 kg/m  SpO2: SpO2: 100 % O2 Device: O2 Device: Nasal Cannula O2 Flow Rate: O2 Flow Rate (L/min): 3 L/min       Palliative Assessment/Data: 20%     Palliative Care Plan    Recommendations/Plan: DNR, no tracheostomy, no reintubation, no return to the hospital, focus on comfort Bed requested at [facility]  Code Status:  DNR  Prognosis:  Hours - Days   Discharge Planning: Hospice facility  Care plan was discussed with CCM MD, ICU RN, family  Thank you for allowing the Palliative Medicine Team to assist in the care of this patient.  Total time spent: 65 minutes Time in 1030 Time out 11 Time in 315 Time out 350     Greater than 50%  of this time was spent counseling and coordinating care related to the above assessment and plan.  Florentina Jenny, PA-C Palliative Medicine  Please contact Palliative MedicineTeam phone at [phone] for questions and concerns between 7 am - 7 pm.   Please see AMION for individual provider pager numbers.

## 2020-12-07 NOTE — Progress Notes (Signed)
I have noted the events that happened over the weekend.  Looks like he may have another seizure.  He is intubated again.  The hope is to be extubated today.  I think Walter Hernandez shown [place] what is going to happen to him.  I think his brain is clearly dysfunctional now.  This is from the metastasis.  This probably also from the radiation that he had.  His wife is very undecided as to whether or not he will go back onto a ventilator if he does not make it off the vent.  I told her that I would not recommend putting him back on a ventilator because I do still think he would then come off it.  She still is holding out hope that he will have some meaningful quality of life.  She would like to try to get him home.  I think this would clearly be a Herculean task.  She says she is in need a lot of help at home.  She is thinking about hiring a Marine scientist.  I am not sure how much that is going to cost her.  I clearly believe that Hospice is appropriate for him now.  I will think would be a bad idea to get him at [facility].  There, his wife Would be his wife and I have to worry about doing things for him.  I know that he woke up when I called his name.  I think he may have recognized me.  That is giving her some hope that he might make some meaningful recovery.  I really hate that for the most part, he is existing.  He does has no quality of life.  I think would be difficult for him to have any kind of quality of life given all that has happened to him.  I do not tell her how long I thought he had to live.  I know this is debatable.  However, it would not surprise me if he were to have another neurological event.  I just feel for his wife.  I know that she is always been right there with him.  She has always make sure he has had what he has needed.  I know it is quite difficult for her to "change" her philosophy for him and just provide comfort.  I know she wants him to have comfort.  She wants him to have respect  and dignity.  I told her that I just do not think he would have this if he were to go back on a ventilator.  I do appreciate the incredible work that is being done on staff up on [address].  I am so grateful for all their hard work.  Lattie Haw, MD  [address] 4:6-8

## 2020-12-07 NOTE — Progress Notes (Signed)
Patients wife(Mrs Delgrande) not ready to transfer pt this evening to floor.Her concern is pt will not be monitored as closely as in the ICU if he was to have a seizure for example. Charge RN & I explained to Mrs Kingry that since pt was classified as palliative pt would not require ICU or monitoring done in ICU. She says she is not ready for pt to go to a floor room especially tonight. She also wants her husband to remain full code at this time. Dr Genevive Bi in [place] spoke with her so for tonight we will hold off the transfer and return pt to full code status pending that Ms Kiener understands after she talks again with the rounding teams.

## 2020-12-07 NOTE — Progress Notes (Signed)
Horticulturist, commercial note  Requested visit to explain services of hospice at home or at [facility] on 7/3.   Hospital liaison met with wife at bedside and provided education on hospice  and  the differences between hospice in the home and in [place] at [facility].   Wife states she is not wanting to make any decisions and is not ready to talk about patient being in last weeks of life. She states she has our contact information and will call us if she wants to talk more.   Please call if you have any additional questions or concerns.  Farrel Gordon, RN  [employer]  [phone]

## 2020-12-07 NOTE — Progress Notes (Signed)
eLink Physician-Brief Progress Note Patient Name: Walter Hernandez DOB: 04/08/1951 MRN: [id number]   Date of Service  12/07/2020  HPI/Events of Note  Informed by bedside RN that wife would want to change code status to full code.  eICU Interventions  Spoke with wife on camera and she states that she is not ready to make husband DNR at this point. She wants to discuss options further before making this definitive decision of making him DNR, which includes placement. She needs clarification regarding plans going forward.  Wife feels that patient still needs closer monitoring in the ICU esp since he was just extubated this afternoon.  Will change code status to full code for now and hold transfer. Further discussion with palliative care in the morning regarding goals of care.     Intervention Category Minor Interventions: Communication with other healthcare providers and/or family  Judd Lien 12/07/2020, 10:24 PM

## 2020-12-07 NOTE — Procedures (Signed)
Extubation Procedure Note  Patient Details:   Name: Walter Hernandez DOB: 1951/01/07 MRN: [id number]   Airway Documentation:    Vent end date: 12/07/20 Vent end time: 1415   Evaluation  O2 sats: stable throughout Complications: No apparent complications Patient did tolerate procedure well. Bilateral Breath Sounds: Diminished, Clear   Yes  RT one way extubated patient to 3L [place] per MD order with RN, MD and family at bedside. Positive cuff leak noted. No stridor or distress noted at this time. RT will continue to monitor as needed.  Fabiola Backer 12/07/2020, 2:20 PM

## 2020-12-07 NOTE — Progress Notes (Signed)
SLP Cancellation Note  Patient Details Name: Walter Hernandez MRN: [id number] DOB: Nov 19, 1950   Cancelled treatment:       Reason Eval/Treat Not Completed: Patient not medically ready (Pt remains on the vent at this time. SLP will f/u)  Walter Hernandez I. Hardin Negus, [place], [place] Office number [phone] Pager [phone]  Horton Marshall 12/07/2020, 7:43 AM

## 2020-12-07 NOTE — Plan of Care (Signed)
Palliative care team had meeting with patient's wife, decision was made to do one-way extubation, as patient does not want tracheostomy and remain on ventilator for long-term. Extubation order was placed Patient will be going home with hospice care once services are set up.  DNR DNI orders were placed     Jacky Kindle MD [facility] See Amion for pager If no response to pager, please call [phone] until 7pm After 7pm, Please call E-link [phone]

## 2020-12-08 ENCOUNTER — Telehealth: Payer: Self-pay

## 2020-12-08 DIAGNOSIS — Z7189 Other specified counseling: Secondary | ICD-10-CM | POA: Diagnosis not present

## 2020-12-08 DIAGNOSIS — C3490 Malignant neoplasm of unspecified part of unspecified bronchus or lung: Secondary | ICD-10-CM | POA: Diagnosis not present

## 2020-12-08 DIAGNOSIS — Z515 Encounter for palliative care: Secondary | ICD-10-CM

## 2020-12-08 DIAGNOSIS — R569 Unspecified convulsions: Secondary | ICD-10-CM | POA: Diagnosis not present

## 2020-12-08 DIAGNOSIS — G9341 Metabolic encephalopathy: Secondary | ICD-10-CM | POA: Diagnosis not present

## 2020-12-08 DIAGNOSIS — J9601 Acute respiratory failure with hypoxia: Secondary | ICD-10-CM | POA: Diagnosis not present

## 2020-12-08 MED ORDER — PROSOURCE TF PO LIQD
45.0000 mL | Freq: Two times a day (BID) | ORAL | Status: DC
  Administered 2020-12-08 – 2020-12-09 (×3): 45 mL
  Filled 2020-12-08 (×3): qty 45

## 2020-12-08 MED ORDER — FAMOTIDINE IN NACL 20-0.9 MG/50ML-% IV SOLN
20.0000 mg | INTRAVENOUS | Status: DC
  Administered 2020-12-08 – 2020-12-13 (×6): 20 mg via INTRAVENOUS
  Filled 2020-12-08 (×6): qty 50

## 2020-12-08 MED ORDER — LORAZEPAM 2 MG/ML IJ SOLN
1.0000 mg | INTRAMUSCULAR | Status: DC | PRN
Start: 2020-12-08 — End: 2020-12-17
  Administered 2020-12-08: 2 mg via INTRAVENOUS
  Filled 2020-12-08: qty 1

## 2020-12-08 NOTE — Telephone Encounter (Signed)
After several attempts to contact patient to schedule a Palliative Care consult appointment with no return call will cancel referral and notify Dr Marin Olp.

## 2020-12-08 NOTE — Progress Notes (Signed)
Manufacturing engineer [facility]) Hospital Liaison Note:  Update on this [place] Referral:  Spoke to patient spouse today who confirmed the family is interested in a bed for Walter Hernandez and discussed hospice philosophy and answered questions about [facility] services.  Chart is under review for eligibility at this time.   Unfortunately, [facility] does not have a bed to offer today but will follow up with family and TOC in the morning or sooner if room becomes available.   Please do not hesitate to call with questions,   Thank you,   Gar Ponto, RN [facility] Liaison [phone]

## 2020-12-08 NOTE — Progress Notes (Signed)
[facility] PROGRESS NOTE  Patient: Walter Hernandez BSW:[id number]   PCP: Lavone Orn, MD DOB: 1950-12-22   DOA: 12/01/2020   DOS: 12/08/2020     Extensive discussion with wife for 45 minutes, her goal is to provide the best care for the pt and she understand the terminal nature of his disease process. She is afraid that she is failing her husband. I have informed her that pt's disease burden is too much and all consultants are not able to offer any treatment option that pt will be able to tolerate in his current state.  She is ready to make a decision to switch him to DNR DNI. She understands that when he is not able to breath on his own or his heart stops that will mean we will let him pass away comfortably and in dignity.  Here current goal is to treat what is treatable. She understand that pt will not be able to ambulate or be independent for his ADL as of now.  She is also agreeable to transfer him out of the ICU as he is stable for now and does not require ICU level care.   Plan DNR DNI Transfer to neuro progressive.  Treat what is treatable.  Continue to engage with wife for goal of care. Pt's prognosis is poor and will benefit from complete comfort approach.   Author: Berle Mull, MD [facility] 12/08/2020 8:53 AM   If 7PM-7AM, please contact night-coverage at [email or web address]

## 2020-12-08 NOTE — Progress Notes (Signed)
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 Presented to hospital with confusion.  Found to have status epilepticus was intubated and was with ICU. Transfer to hospital service on 12/04/2020. Intubated again on 7/3 Extubated again on 7/4 initial plan was for comfort care but later on wife changed back to full code. 7/5 patient back to DNR/DNI.  Goal of care is to treat what is treatable in the hospital.  Hospice on discharge.  Currently plan is monitor for improvement in mentation, toleration of the oral diet and continue to engage with wife regarding goals of care conversation  Assessment and Plan: 1.  Status epilepticus Metastatic non-small cell cancer with brain metastasis. Acute metabolic encephalopathy Presented with confusion. Found to have status epilepticus. Neurology was consulted. Patient was admitted to the ICU. On Keppra 500 mg twice daily, dose increased to 1000 mg twice daily.  Long-term EEG monitoring did not show any ongoing seizures. Keppra dose reduced to 500 mg twice daily again. Patient was also started on Dilantin. Received 1 dose of Vimpat Mentation improving.  Patient is able to follow commands.  Able to answer questions. Speech therapy consulted.  Dysphagia 1 diet with nectar thick liquid.  Patient has slow mastication.   On 7/3 night mentation worsened again requiring intubation.  Extubated on 7/4. Concern with aspiration risk, wife aware. He has NG tube. PT OT consulted as well. Transfer out of the ICU to progressive care unit  2.  Stage Ib melanoma. Small cell lung cancer with metastasis. Discussed with oncology. We will follow-up on the patient. Neurology discussed with neuro oncology.  Not a good  candidate for further radiation therapy. Patient has seen oncology outpatient. Patient performance status has been worsening therefore not a good candidate for chemotherapy as well. Palliative care consulted for goals of care conversation.  3.  Dysphagia. Speech therapy consulted. Currently has NG tube. Currently on dysphagia 1 diet nectar thick liquid.  4.  Acute respiratory failure with hypoxia, present on admission. Required intubation on admission. Secondary to seizures. Currently on room air. Monitor.  Chest x-ray negative for any acute abnormality.  5.  Hyponatremia, hypomagnesemia Currently corrected. Monitor.  6.  Acute kidney injury on CKD 3A. Renal function improved with IV hydration. Likely pre-renal etiology Monitor.  Avoid nephrotoxic medication.  7.  Anemia and thrombocytopenia secondary to chemotherapy as well as nutritional deficiency. Currently H&H and platelets are stable. Monitor.  8.  Goals of care conversation. Wife at bedside.  Was asking about whether any other therapy available to improve patient's demyelination or white matter disease. Explained about current standard of care. Explained about potential for recurrence of the patient with small cell cancer.  She verbalized understanding.  Palliative care continue to follow.  Ideally patient would benefit from DNR/DNI status.  7/5 Extensive discussion with wife for 45 minutes, her goal is to provide the best care for the pt and she understand the terminal nature of his disease process. She is afraid that she is failing her husband. I have informed her that pt's disease burden is too much and all consultants are not able to offer any treatment option that pt will be able to tolerate in his current state. She is ready to make a decision to switch him  to DNR DNI. She understands that when he is not able to breath on his own or his heart stops that will mean we will let him pass away comfortably and in  dignity. Here current goal is to treat what is treatable. She understand that pt will not be able to ambulate or be independent for his ADL as of now. She is also agreeable to transfer him out of the ICU as he is stable for now and does not require ICU level care.   Plan DNR DNI Transfer to neuro progressive. Treat what is treatable. Continue to engage with wife for goal of care. Pt's prognosis is poor and will benefit from complete comfort approach.  Residential hospice on discharge.  Body mass index is 24.76 kg/m.  Nutrition Problem: Increased nutrient needs Etiology: cancer and cancer related treatments Interventions: Interventions: Tube feeding, Prostat, MVI      Diet: N.p.o. for now.  Speech therapy consulted.  Tube feedings on. DVT Prophylaxis:   enoxaparin (LOVENOX) injection 40 mg Start: 12/03/20 1000    Advance goals of care discussion: DNR/DNI  Family Communication: family was present at bedside, at the time of interview.  Opportunity was given to ask question and all questions were answered satisfactorily.   Disposition:  Status is: Inpatient  Remains inpatient appropriate because:IV treatments appropriate due to intensity of illness or inability to take PO and Inpatient level of care appropriate due to severity of illness  Dispo: The patient is from: Home              Anticipated d/c is to: SNF              Patient currently is not medically stable to d/c.   Difficult to place patient No   Subjective: Alert.  Occasionally able to follow command.  No nausea or vomiting.  No pain reported.  No acute events overnight.  Physical Exam:  General: Appear in mild distress, no Rash; Oral Mucosa Clear, dry. no Abnormal Neck Mass Or lumps, Conjunctiva normal  Cardiovascular: S1 and S2 Present, no Murmur, Respiratory: good respiratory effort, Bilateral Air entry present and CTA, no Crackles, no wheezes Abdomen: Bowel Sound present, Soft and no tenderness Extremities:  no Pedal edema Neurology: alert and oriented to self and place affect appropriate. no new focal deficit, left-sided weakness Gait not checked due to patient safety concerns   Vitals:   12/08/20 1400 12/08/20 1500 12/08/20 1618 12/08/20 1801  BP: 118/80 114/79  113/69  Pulse: 85 82  77  Resp: (!) _0 Temp:   98.5 F (36.9 C)   TempSrc:   Axillary   SpO2: 98% 95%  99%  Weight:      Height:       Filed Weights   12/04/20 1230 12/04/20 1300 12/05/20 0400  Weight: 65.8 kg 65.9 kg 71.7 kg    Data Reviewed: I have personally reviewed and interpreted daily labs, tele strips, imaging. I reviewed all nursing notes, pharmacy notes, vitals, pertinent old records I have discussed plan of care as described above with RN and patient/family.  CBC: Recent Labs  Lab 12/02/20 0534 12/03/20 0522 12/04/20 1030 12/05/20 0551 12/06/20 0217 12/06/20 0411 12/07/20 1222  WBC 7.6 9.3 10.9* 9.4  --   --  7.8  NEUTROABS  --   --  9.2*  --   --   --   --   HGB 10.6* 10.3* 9.9* 10.3* 11.2* 10.2* 8.8*  HCT 30.7* 30.3* 29.8*  31.3* 33.0* 30.0* 27.4*  MCV 94.8 97.4 99.0 98.1  --   --  101.5*  PLT 103* 106* 100* 125*  --   --  107*    Basic Metabolic Panel: Recent Labs  Lab 12/02/20 0534 12/02/20 1625 12/03/20 0522 12/03/20 1855 12/04/20 0625 12/04/20 1030 12/05/20 0551 12/06/20 0217 12/06/20 0309 12/06/20 0411 12/07/20 1222  NA 133*  --  135  --   --  135 136 135 137 137 138  K 4.9  --  4.1  --   --  4.5 4.4 5.9* 6.2* 5.5* 4.5  CL 104  --  104  --   --  104 102  --  102  --  106  CO2 21*  --  25  --   --  26 27  --  26  --  26  GLUCOSE 144*  --  123*  --   --  176* 151*  --  113*  --  110*  BUN 13  --  16  --   --  20 21  --  34*  --  44*  CREATININE 1.79*  --  1.60*  --   --  1.32* 1.20  --  1.89*  --  1.60*  CALCIUM 8.4*  --  8.0*  --   --  8.7* 9.0  --  8.6*  --  8.5*  MG 1.0* 2.2 1.8 2.1 1.9  --  1.6*  --   --   --  1.4*  PHOS 4.9* 3.6 3.9 3.6 3.1  --   --   --   --   --    --      Studies: No results found.  Scheduled Meds:  chlorhexidine  15 mL Mouth Rinse BID   chlorhexidine gluconate (MEDLINE KIT)  15 mL Mouth Rinse BID   Chlorhexidine Gluconate Cloth  6 each Topical Daily   dexamethasone (DECADRON) injection  4 mg Intravenous Q8H   dextrose  50 mL Intravenous Once   enoxaparin (LOVENOX) injection  40 mg Subcutaneous Q24H   feeding supplement (PROSource TF)  45 mL Per Tube BID   mouth rinse  15 mL Mouth Rinse q12n4p   phenytoin (DILANTIN) IV  100 mg Intravenous BID   phenytoin (DILANTIN) IV  75 mg Intravenous Q1400   sodium chloride flush  10-40 mL Intracatheter Q12H   Continuous Infusions:  famotidine (PEPCID) IV Stopped (12/08/20 1146)   feeding supplement (OSMOLITE 1.5 CAL) 1,000 mL (12/08/20 0304)   levETIRAcetam 500 mg (12/08/20 1053)   PRN Meds: acetaminophen, ipratropium-albuterol, LORazepam, sodium chloride flush  Time spent: 35 minutes  Author: Berle Mull, MD [facility] 12/08/2020 7:12 PM  To reach On-call, see care teams to locate the attending and reach out via [email or web address]. Between 7PM-7AM, please contact night-coverage If you still have difficulty reaching the attending provider, please page the [facility] (Director on Call) for [facility] on amion for assistance.

## 2020-12-08 NOTE — Progress Notes (Signed)
Daily Progress Note   Patient Name: Walter Hernandez       Date: 12/08/2020 DOB: 1950-08-31  Age: 70 y.o. MRN#: [id number] Attending Physician: Walter Hamman, MD Primary Care Physician: Walter Orn, MD Admit Date: 12/01/2020  Reason for Consultation/Follow-up:  To discuss complex medical decision making related to patient's goals of care  Subjective: Medical records reviewed. Discussed with RN and assessed patient at the bedside. He is resting comfortably. Patient's wife is present at the bedside as well.  Walter Hernandez is quite emotional and speaks at length of her anticipatory grief, the care she has taken of Walter Hernandez, and her focus on ensuring that he continues to receive attentive care. She shares her concern that different areas of the hospital may not have adequate staffing to support him while he awaits a [employer] bed. She is having difficulty eating and sleeping while taking this all in. She also questions whether she has made the right decisions over the past few days. Reassurance and emotional support provided.  Reflective listening was practiced as Walter Hernandez reflects on their life together. She feels that she is able to process information better and adjust to the upcoming changes when she is speaking freely. She states her understanding of the severity of patient's illness and we reviewed the course of illness up until this point. Walter Hernandez acknowledges that it is now time to focus on patient's comfort, dignity, and peace. She openly states how difficult it is to accept this, but she is willing to proceed with a referral to [facility] for expert hospice care. She has many family members who have shared their firsthand experience at the facility over the past year and she agrees with the  hospice philosophy. While she would rather take Walter Hernandez home, she understands that his needs surpass the care she is able to provide on her own.   Discussed the option of freeing Walter Hernandez from the NG tube and focusing on comfort while awaiting a bed at [facility]. Educated and counseled that the patient would no longer receive aggressive medical interventions such as continuous vital signs, lab work, radiology testing, or medications not focused on comfort. All care would focus on how the patient is looking and feeling. This would include management of any symptoms that may cause discomfort, pain, shortness of breath, cough, nausea,  agitation, anxiety, and/or secretions etc. Symptoms would be managed with medications and other non-pharmacological interventions such as spiritual support if requested, repositioning, music therapy, or therapeutic listening.   She agrees with the above measures taking place while at [facility], but she is still anxious and not ready to transition to comfort care while in the hospital. Questions and concerns addressed. PMT will continue to support holistically.   Assessment: 70 year old gentleman with metastatic small cell lung cancer to the brain Goals of care conversation  Patient Profile/HPI:   70 y.o. 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 Patient was diagnosed with small cell lung cancer with mets to the brain and liver in December of 2020 and started chemotherapy in February of 2021. An MRI in February of 2022 showed worsening brain mets. He underwent salvage SRS on 2/16. He was admitted for seizures on 09/05/20. MRI in march of 2022 showed worsening brain mets again. He underwent SRS a second time on 10/05/2020. In his last visit with Dr. Marin Hernandez on 11/11/2020 chemotherapy was stopped due to the patient having some difficulty with the thearpy. This admission, CT head was  negative for acute abnormality and MRI was negative for acute infarct. Multiple medications given for seizure activity. Patient experienced acute respiratory failure and required intubation. PMT consulted to discuss [place].     Length of Stay: 7   Vital Signs: BP 114/79   Pulse 82   Temp 98.3 F (36.8 C) (Axillary)   Resp 20   Ht 5\' 7"  (1.702 m)   Wt 71.7 kg   SpO2 95%   BMI 24.76 kg/m  SpO2: SpO2: 95 % O2 Device: O2 Device: Room Air O2 Flow Rate: O2 Flow Rate (L/min): 3 L/min       Palliative Assessment/Data: 10%     Palliative Care Plan    Recommendations/Plan: DNR confirmed, no reintubation, no return to the hospital Continue current interventions while hospitalized and awaiting bed at [facility], patient appears comfortable Psychosocial and emotional support provided  Code Status:  DNR  Prognosis:  Hours - Days   Discharge Planning: Hospice facility  Time In: 1300 Time Out: 1415 Total time: 65 minutes Prolonged time: Yes  Greater than 50% of this time was spent in counseling and coordinating care related to the above assessment and plan.  Walter Cooler, PA-C Palliative Medicine Team Team phone # [phone]  Thank you for allowing the Palliative Medicine Team to assist in the care of this patient. Please utilize secure chat with additional questions, if there is no response within 30 minutes please call the above phone number.  Palliative Medicine Team providers are available by phone from 7am to 7pm daily and can be reached through the team cell phone.  Should this patient require assistance outside of these hours, please call the patient's attending physician.

## 2020-12-08 NOTE — Progress Notes (Signed)
Pt arrived to 4NP06 from 4NICU. Report received from Trish Fountain, Therapist, sports. Vital signs taken and WNL, full assessment completed and charted. Wife at the bedside.  Justice Rocher, RN

## 2020-12-08 NOTE — TOC Initial Note (Signed)
Transition of Care [facility]) - Initial/Assessment Note    Patient Details  Name: Walter Hernandez MRN: [id number] Date of Birth: [birth year]  Transition of Care [facility]) CM/SW Contact:    Benard Halsted, LCSW Phone Number: 12/08/2020, 9:52 AM  Clinical Narrative:                 CSW received consult regarding hospice placement. CSW spoke with patient's spouse and she confirmed request for [facility]. CSW provided support and sent referral to [facility] with [facility] for review.   Expected Discharge Plan: [place] Barriers to Discharge: Hospice Bed not available   Patient Goals and CMS Choice Patient states their goals for this hospitalization and ongoing recovery are:: Comfort CMS Medicare.gov Compare Post Acute Care list provided to:: Patient Represenative (must comment) Choice offered to / list presented to : Spouse  Expected Discharge Plan and Services Expected Discharge Plan: [place] In-house Referral: Clinical Social Work, Hospice / [place] Acute Care Choice: Hospice Living arrangements for the past 2 months: Single Family Home                                      Prior Living Arrangements/Services Living arrangements for the past 2 months: Single Family Home Lives with:: Spouse Patient language and need for interpreter reviewed:: Yes Do you feel safe going back to the place where you live?: Yes      Need for Family Participation in Patient Care: Yes (Comment) Care giver support system in place?: Yes (comment) Current home services: DME Criminal Activity/Legal Involvement Pertinent to Current Situation/Hospitalization: No - Comment as needed  Activities of Daily Living Home Assistive Devices/Equipment: Walker (specify type), Eyeglasses, Blood pressure cuff, Grab bars in shower, Raised toilet seat with rails, Shower chair without back ADL Screening (condition at time of admission) Patient's cognitive ability adequate to  safely complete daily activities?: No Is the patient deaf or have difficulty hearing?: No Does the patient have difficulty seeing, even when wearing glasses/contacts?: No Does the patient have difficulty concentrating, remembering, or making decisions?: Yes Patient able to express need for assistance with ADLs?: No Does the patient have difficulty dressing or bathing?: Yes Independently performs ADLs?: No Communication: Needs assistance, Dependent Is this a change from baseline?: Change from baseline, expected to last >3 days Dressing (OT): Dependent Is this a change from baseline?: Change from baseline, expected to last >3 days Grooming: Dependent Is this a change from baseline?: Change from baseline, expected to last >3 days Feeding: Dependent Is this a change from baseline?: Change from baseline, expected to last >3 days Bathing: Dependent Is this a change from baseline?: Change from baseline, expected to last >3 days Toileting: Dependent Is this a change from baseline?: Change from baseline, expected to last >3days In/Out Bed: Dependent Is this a change from baseline?: Change from baseline, expected to last >3 days Walks in Home: Dependent Is this a change from baseline?: Change from baseline, expected to last >3 days Does the patient have difficulty walking or climbing stairs?: Yes Weakness of Legs: Both Weakness of Arms/Hands: Both  Permission Sought/Granted Permission sought to share information with : Facility Sport and exercise psychologist, Family Supports Permission granted to share information with : No  Share Information with NAME: Colletta [place]  Permission granted to share info w AGENCY: Hospice  Permission granted to share info w Relationship: Spouse  Permission granted to share  info w Contact Information: [phone]  Emotional Assessment Appearance:: Appears stated age Attitude/Demeanor/Rapport: Unable to Assess Affect (typically observed): Unable to Assess Orientation: :  Oriented to Self Alcohol / Substance Use: Not Applicable Psych Involvement: No (comment)  Admission diagnosis:  Status epilepticus ([place]) [G40.901] Seizure ([place]) [R56.9] Patient Active Problem List   Diagnosis Date Noted   Seizure ([place]) 76/54/6503   Acute metabolic encephalopathy with right sided weakness and acute aphasia  12/01/2020   Benign prostatic hyperplasia without lower urinary tract symptoms 12/01/2020   Acute respiratory failure with hypoxia ([place]) 12/01/2020   CKD (chronic kidney disease) stage 3, GFR 30-59 ml/min ([place]) 12/01/2020   Alteration in electrolyte and fluid balance 12/01/2020   Thrombocytopenia ([place])    Encounter for intubation    Goals of care, counseling/discussion 06/19/2019   Extensive stage primary small cell carcinoma of lung ([place]) with known metastasis to brain 06/11/2019   Brain metastases ([place]) 06/03/2019   Essential hypertension 06/03/2019   GERD (gastroesophageal reflux disease) 06/03/2019   Malignant melanoma of skin of left lower leg ([place]) 07/28/2015   PCP:  Lavone Orn, MD Pharmacy:   [phone] - [place], [place] - [address] [place] [address] Phone: [phone] Fax: [phone]     Social Determinants of Health (SDOH) Interventions    Readmission Risk Interventions Readmission Risk Prevention Plan 12/08/2020 06/04/2019  Post Dischage Appt - Not Complete  Appt Comments - pending stability/disposition  Medication Screening - Complete  Transportation Screening Complete Complete  Medication Review Press photographer) Complete -  PCP or Specialist appointment within 3-5 days of discharge Complete -  [place] or Home Care Consult Complete -  SW Recovery Care/Counseling Consult Complete -  Palliative Care Screening Complete -  [place] Not Applicable -  Some recent data might be hidden

## 2020-12-08 NOTE — Progress Notes (Signed)
This chaplain is present for F/U spiritual care. The Pt. wife-Jackie is away from the bedside at the time of the visit. The chaplain was updated by the Pt. RN-Kim K. The chaplain will attempt F/U spiritual care today, [phone].

## 2020-12-08 NOTE — Progress Notes (Signed)
MEDICATION RELATED CONSULT NOTE - INITIAL   Pharmacy Consult for Phenytoin Indication: Seizures  Allergies  Allergen Reactions   Other Itching and Other (See Comments)    Seasonal allergies: Sinus headaches, sneezing, itchy eyes    Patient Measurements: Height: _0  (170.2 cm) Weight: 71.7 kg (158 lb 1.1 oz) IBW/kg (Calculated) : 66.1  Vital Signs: Temp: 98.6 F (37 C) (07/05 0800) Temp Source: Axillary (07/05 0800) BP: 117/71 (07/05 1100) Pulse Rate: 83 (07/05 1100) Intake/Output from previous day: 07/04 0701 - 07/05 0700 In: 416.3 [NG/GT:216.3; IV Piggyback:200] Out: 1485 [Urine:1485] Intake/Output from this shift: Total I/O In: 230 [I.V.:10; NG/GT:220] Out: 324 [Urine:324]  Labs: Recent Labs    12/06/20 0217 12/06/20 0309 12/06/20 0411 12/07/20 1222  WBC  --   --   --  7.8  HGB 11.2*  --  10.2* 8.8*  HCT 33.0*  --  30.0* 27.4*  PLT  --   --   --  107*  CREATININE  --  1.89*  --  1.60*  MG  --   --   --  1.4*  ALBUMIN  --   --   --  2.7*   Estimated Creatinine Clearance: 39.6 mL/min (A) (by C-G formula based on SCr of 1.6 mg/dL (H)).   Microbiology: Recent Results (from the past 720 hour(s))  Urine culture     Status: None   Collection Time: 11/09/20  7:00 PM   Specimen: Urine, Clean Catch  Result Value Ref Range Status   Specimen Description   Final    URINE, CLEAN CATCH Performed at [facility], [place] [address], [place], [place] [address]    Special Requests   Final    NONE Performed at [facility], [place] [address] [place], [place] [address]    Culture   Final    NO GROWTH Performed at [facility], [place] [address], [place], [place] [address]    Report Status 11/11/2020 FINAL  Final  SARS CORONAVIRUS 2 (TAT 6-24 HRS) Nasopharyngeal Nasopharyngeal Swab     Status: None   Collection Time: 12/01/20 11:45 AM   Specimen: Nasopharyngeal Swab  Result Value Ref Range Status   SARS Coronavirus 2 NEGATIVE  NEGATIVE Final    Comment: (NOTE) SARS-CoV-2 target nucleic acids are NOT DETECTED.  The SARS-CoV-2 RNA is generally detectable in upper and lower respiratory specimens during the acute phase of infection. Negative results do not preclude SARS-CoV-2 infection, do not rule out co-infections with other pathogens, and should not be used as the sole basis for treatment or other patient management decisions. Negative results must be combined with clinical observations, patient history, and epidemiological information. The expected result is Negative.  Fact Sheet for Patients: SugarRoll.be  Fact Sheet for Healthcare Providers: https://www.woods-mathews.com/  This test is not yet approved or cleared by the [place] FDA and  has been authorized for detection and/or diagnosis of SARS-CoV-2 by FDA under an Emergency Use Authorization (EUA). This EUA will remain  in effect (meaning this test can be used) for the duration of the COVID-19 declaration under Se ction 564(b)(1) of the Act, 21 U.S.C. section 360bbb-3(b)(1), unless the authorization is terminated or revoked sooner.  Performed at [facility], [place] [address], [place], [place] [address]   MRSA Next Gen by PCR, Nasal     Status: Abnormal   Collection Time: 12/01/20  5:06 PM   Specimen: Nasal Mucosa; Nasal Swab  Result Value Ref Range Status  MRSA by PCR Next Gen DETECTED (A) NOT DETECTED Final    Comment: RESULT CALLED TO, READ BACK BY AND VERIFIED WITH: RN DUSTINE. [phone] FCP (NOTE) The GeneXpert MRSA Assay (FDA approved for NASAL specimens only), is one component of a comprehensive MRSA colonization surveillance program. It is not intended to diagnose MRSA infection nor to guide or monitor treatment for MRSA infections. Test performance is not FDA approved in patients less than 80 years old. Performed at [facility], [place] [address] [place], [place] [address]      Medical History: Past Medical History:  Diagnosis Date   Cancer [facility])    recently discovered melanoma   GERD (gastroesophageal reflux disease)    Goals of care, counseling/discussion 06/19/2019   History of hiatal hernia    Hypertension    recently placed on new blood pressure med   Malignant melanoma of skin of left lower leg (HCC) 07/28/2015    Medications:  Scheduled:   chlorhexidine  15 mL Mouth Rinse BID   chlorhexidine gluconate (MEDLINE KIT)  15 mL Mouth Rinse BID   Chlorhexidine Gluconate Cloth  6 each Topical Daily   dexamethasone (DECADRON) injection  4 mg Intravenous Q8H   dextrose  50 mL Intravenous Once   enoxaparin (LOVENOX) injection  40 mg Subcutaneous Q24H   feeding supplement (PROSource TF)  45 mL Per Tube BID   mouth rinse  15 mL Mouth Rinse q12n4p   phenytoin (DILANTIN) IV  100 mg Intravenous BID   phenytoin (DILANTIN) IV  75 mg Intravenous Q1400   sodium chloride flush  10-40 mL Intracatheter Q12H    Assessment: 70 years of age male with SCLC with metastases to the brain and liver with possible seizures. Currently on Keppra IV (initially increased but now back to home dose due to concerns for contribution to agitation with higher doses) and Dilantin IV.   Dilantin levels and Course of admission: 6/28 DPH 22.8 mcg/mL post load 6/30 DPH 18.9, albumin 2.8 >> corrected to 28.6 - only checked d/t neuro status 7/3 Noted change in mentation, Keppra 1g IV x1.  CT negative, no seizures. 7/4 DPH/albumin 16.5/2.7 >> corrects to 25.78 - dose was reduced by 25 mg. *SCr is improving.   Goal of Therapy:  Dilantin level 10-20 mcg/mL  Plan:  Continue with reduced dose of Dilantin 100 mg IV AM, 75 mg IV noon, and 100 mg IV PM (reduced on 7/4).  Will plan for repeat levels at Css - ~4-5 days.  Monitor renal function. Consider level earlier if significant change in renal function or mentation.   Sloan Leiter, PharmD, BCPS, BCCCP Clinical Pharmacist Please refer  to [facility] for [place] numbers 12/08/2020,11:30 AM

## 2020-12-09 ENCOUNTER — Inpatient Hospital Stay: Payer: Medicare Other

## 2020-12-09 ENCOUNTER — Encounter: Payer: Self-pay | Admitting: *Deleted

## 2020-12-09 ENCOUNTER — Inpatient Hospital Stay: Payer: Medicare Other | Admitting: Hematology & Oncology

## 2020-12-09 DIAGNOSIS — J9601 Acute respiratory failure with hypoxia: Secondary | ICD-10-CM | POA: Diagnosis not present

## 2020-12-09 DIAGNOSIS — Z515 Encounter for palliative care: Secondary | ICD-10-CM | POA: Diagnosis not present

## 2020-12-09 DIAGNOSIS — C4372 Malignant melanoma of left lower limb, including hip: Secondary | ICD-10-CM | POA: Diagnosis not present

## 2020-12-09 DIAGNOSIS — Z7189 Other specified counseling: Secondary | ICD-10-CM | POA: Diagnosis not present

## 2020-12-09 LAB — MAGNESIUM: Magnesium: 1.5 mg/dL — ABNORMAL LOW (ref 1.7–2.4)

## 2020-12-09 LAB — BASIC METABOLIC PANEL
Anion gap: 7 (ref 5–15)
BUN: 38 mg/dL — ABNORMAL HIGH (ref 8–23)
CO2: 28 mmol/L (ref 22–32)
Calcium: 8.9 mg/dL (ref 8.9–10.3)
Chloride: 109 mmol/L (ref 98–111)
Creatinine, Ser: 1.45 mg/dL — ABNORMAL HIGH (ref 0.61–1.24)
GFR, Estimated: 52 mL/min — ABNORMAL LOW (ref >60)
Glucose, Bld: 154 mg/dL — ABNORMAL HIGH (ref 70–99)
Potassium: 4.3 mmol/L (ref 3.5–5.1)
Sodium: 144 mmol/L (ref 135–145)

## 2020-12-09 LAB — CBC
HCT: 24.8 % — ABNORMAL LOW (ref 39.0–52.0)
Hemoglobin: 8.4 g/dL — ABNORMAL LOW (ref 13.0–17.0)
MCH: 33.7 pg (ref 26.0–34.0)
MCHC: 33.9 g/dL (ref 30.0–36.0)
MCV: 99.6 fL (ref 80.0–100.0)
Platelets: 105 10*3/uL — ABNORMAL LOW (ref 150–400)
RBC: 2.49 MIL/uL — ABNORMAL LOW (ref 4.22–5.81)
RDW: 18.2 % — ABNORMAL HIGH (ref 11.5–15.5)
WBC: 5.1 10*3/uL (ref 4.0–10.5)
nRBC: 0 % (ref 0.0–0.2)

## 2020-12-09 MED ORDER — MAGNESIUM SULFATE 2 GM/50ML IV SOLN
2.0000 g | Freq: Once | INTRAVENOUS | Status: AC
  Administered 2020-12-09: 2 g via INTRAVENOUS
  Filled 2020-12-09: qty 50

## 2020-12-09 NOTE — Progress Notes (Signed)
[facility] Progress Note  Patient: Walter Hernandez    FMB:[id number]  DOA: 12/01/2020     Date of Service: the patient was seen and examined on 12/09/2020  Brief hospital course: Past medical history of metastatic small cell cancer with brain metastasis SP chemoradiation, HTN, malignant melanoma. Presented to hospital with confusion.  Found to have status epilepticus was intubated and was with ICU. Transfer to hospital service on 12/04/2020. Intubated again on 7/3 Extubated again on 7/4 initial plan was for comfort care but later on wife changed back to full code. 7/5 patient back to DNR/DNI.  Goal of care is to treat what is treatable in the hospital.  Hospice on discharge.  Currently plan is monitor for improvement in mentation, toleration of the oral diet and continue to engage with wife regarding goals of care conversation  Assessment and Plan: 1.  Status epilepticus Metastatic non-small cell cancer with brain metastasis. Acute metabolic encephalopathy Presented with confusion. Found to have status epilepticus. Neurology was consulted. Patient was admitted to the ICU. On Keppra 500 mg twice daily, dose increased to 1000 mg twice daily.  Long-term EEG monitoring did not show any ongoing seizures. Keppra dose reduced to 500 mg twice daily again. Patient was also started on Dilantin. Received 1 dose of Vimpat On 7/3 night mentation worsened again requiring intubation.  Extubated on 7/4. Concern with aspiration risk, wife aware. He has NG tube. PT OT and speech following. Continue to monitor in the progressive care unit.  2.  Stage Ib melanoma. Small cell lung cancer with metastasis. Discussed with oncology. Neurology discussed with neuro oncology.  Not a good candidate for further radiation therapy. Patient performance status has been worsening therefore not a good candidate for chemotherapy as well. Palliative care consulted for goals of care conversation.  3.   Dysphagia. Speech therapy consulted. Currently has NG tube. Currently on dysphagia 1 diet thin liquid.  4.  Acute respiratory failure with hypoxia, present on admission. Required intubation on admission. Secondary to seizures. Currently on room air. Monitor.  Chest x-ray negative for any acute abnormality.  5.  Hyponatremia, hypomagnesemia Currently corrected. Monitor.  6.  Acute kidney injury on CKD 3A. Renal function improved with IV hydration. Likely pre-renal etiology Monitor.  Avoid nephrotoxic medication.  7.  Anemia and thrombocytopenia secondary to chemotherapy as well as nutritional deficiency. Currently H&H and platelets are stable. Monitor.  8.  Goals of care conversation. Wife at bedside.  Was asking about whether any other therapy available to improve patient's demyelination or white matter disease. Explained about current standard of care. Explained about potential for recurrence of the patient with small cell cancer.  She verbalized understanding.  Palliative care continue to follow.  Ideally patient would benefit from DNR/DNI status.  7/5 Extensive discussion with wife for 45 minutes,   Plan DNR DNI Transfer to neuro progressive. Treat what is treatable. Continue to engage with wife for goal of care. Pt's prognosis is poor and will benefit from complete comfort approach.  Residential hospice on discharge.  7/6. External discussion between palliative care and wife.  Current goal of care remains DNR/DNI, continue to provide current care and transition to [facility] when a bed is available with focus on complete comfort.  Body mass index is 24.76 kg/m.  Nutrition Problem: Increased nutrient needs Etiology: cancer and cancer related treatments Interventions: Interventions: Tube feeding, Prostat, MVI      Diet: Dysphagia 1 diet.  NPO. DVT Prophylaxis:   enoxaparin (LOVENOX) injection 40  mg Start: 12/03/20 1000    Advance goals of care discussion:  DNR/DNI  Family Communication: family was present at bedside, at the time of interview.  Opportunity was given to ask question and all questions were answered satisfactorily.   Disposition:  Status is: Inpatient  Remains inpatient appropriate because:IV treatments appropriate due to intensity of illness or inability to take PO and Inpatient level of care appropriate due to severity of illness  Dispo: The patient is from: Home              Anticipated d/c is to: SNF              Patient currently is not medically stable to d/c.   Difficult to place patient No   Subjective: Alert.  Unable to follow commands consistently.  Appears more fatigue and tired.  Physical Exam:  General: Appear in mild distress, no Rash; Oral Mucosa with mucus, moist. no Abnormal Neck Mass Or lumps, Conjunctiva normal  Cardiovascular: S1 and S2 Present, no Murmur, Respiratory: good respiratory effort, Bilateral Air entry present and basal crackles, no wheezes Abdomen: Bowel Sound present, Soft and no tenderness Extremities: no Pedal edema Neurology: alert and oriented to self.  Affect appropriate. no new focal deficit, continue left upper extremity weakness Gait not checked due to patient safety concerns   Vitals:   12/09/20 0334 12/09/20 0733 12/09/20 1052 12/09/20 1532  BP: 126/68 110/66 (!) 107/58 119/64  Pulse: 86 85 91 85  Resp: (!) 22 14 19 20   Temp: 97.6 F (36.4 C) 99.5 F (37.5 C) 98.6 F (37 C) 98.6 F (37 C)  TempSrc: Axillary Axillary Axillary Axillary  SpO2: 98% 97% 98% 96%  Weight:      Height:       Filed Weights   12/04/20 1230 12/04/20 1300 12/05/20 0400  Weight: 65.8 kg 65.9 kg 71.7 kg    Data Reviewed: I have personally reviewed and interpreted daily labs, tele strips, imaging. I reviewed all nursing notes, pharmacy notes, vitals, pertinent old records I have discussed plan of care as described above with RN and patient/family.  CBC: Recent Labs  Lab 12/03/20 0522  12/04/20 1030 12/05/20 0551 12/06/20 0217 12/06/20 0411 12/07/20 1222 12/09/20 0530  WBC 9.3 10.9* 9.4  --   --  7.8 5.1  NEUTROABS  --  9.2*  --   --   --   --   --   HGB 10.3* 9.9* 10.3* 11.2* 10.2* 8.8* 8.4*  HCT 30.3* 29.8* 31.3* 33.0* 30.0* 27.4* 24.8*  MCV 97.4 99.0 98.1  --   --  101.5* 99.6  PLT 106* 100* 125*  --   --  107* 105*    Basic Metabolic Panel: Recent Labs  Lab 12/03/20 0522 12/03/20 1855 12/04/20 0625 12/04/20 1030 12/05/20 0551 12/06/20 0217 12/06/20 0309 12/06/20 0411 12/07/20 1222 12/09/20 0530  NA 135  --   --  135 136 135 137 137 138 144  K 4.1  --   --  4.5 4.4 5.9* 6.2* 5.5* 4.5 4.3  CL 104  --   --  104 102  --  102  --  106 109  CO2 25  --   --  26 27  --  26  --  26 28  GLUCOSE 123*  --   --  176* 151*  --  113*  --  110* 154*  BUN 16  --   --  20 21  --  34*  --  44* 38*  CREATININE 1.60*  --   --  1.32* 1.20  --  1.89*  --  1.60* 1.45*  CALCIUM 8.0*  --   --  8.7* 9.0  --  8.6*  --  8.5* 8.9  MG 1.8 2.1 1.9  --  1.6*  --   --   --  1.4* 1.5*  PHOS 3.9 3.6 3.1  --   --   --   --   --   --   --      Studies: No results found.  Scheduled Meds:  chlorhexidine  15 mL Mouth Rinse BID   chlorhexidine gluconate (MEDLINE KIT)  15 mL Mouth Rinse BID   Chlorhexidine Gluconate Cloth  6 each Topical Daily   dexamethasone (DECADRON) injection  4 mg Intravenous Q8H   dextrose  50 mL Intravenous Once   enoxaparin (LOVENOX) injection  40 mg Subcutaneous Q24H   feeding supplement (PROSource TF)  45 mL Per Tube BID   mouth rinse  15 mL Mouth Rinse q12n4p   phenytoin (DILANTIN) IV  100 mg Intravenous BID   phenytoin (DILANTIN) IV  75 mg Intravenous Q1400   sodium chloride flush  10-40 mL Intracatheter Q12H   Continuous Infusions:  famotidine (PEPCID) IV 20 mg (12/09/20 0919)   feeding supplement (OSMOLITE 1.5 CAL) 1,000 mL (12/09/20 0229)   levETIRAcetam 500 mg (12/09/20 0855)   PRN Meds: acetaminophen, ipratropium-albuterol, LORazepam, sodium  chloride flush  Time spent: 35 minutes  Author: Berle Mull, MD [facility] 12/09/2020 6:09 PM  To reach On-call, see care teams to locate the attending and reach out via [email or web address]. Between 7PM-7AM, please contact night-coverage If you still have difficulty reaching the attending provider, please page the [facility] (Director on Call) for [facility] on amion for assistance.

## 2020-12-09 NOTE — Progress Notes (Signed)
Palliative Medicine Inpatient Follow Up Note  Patient Profile/HPI:   70 y.o. 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 Patient was diagnosed with small cell lung cancer with mets to the brain and liver in December of 2020 and started chemotherapy in February of 2021. An MRI in February of 2022 showed worsening brain mets. He underwent salvage SRS on 2/16. He was admitted for seizures on 09/05/20. MRI in march of 2022 showed worsening brain mets again. He underwent SRS a second time on 10/05/2020. In his last visit with Dr. Marin Olp on 11/11/2020 chemotherapy was stopped due to the patient having some difficulty with the thearpy. This admission, CT head was negative for acute abnormality and MRI was negative for acute infarct. Multiple medications given for seizure activity. Patient experienced acute respiratory failure and required intubation. PMT consulted to discuss [place].   Today's Discussion (12/09/2020):  *Please note that this is a verbal dictation therefore any spelling or grammatical errors are due to the "[place] [employer]" system interpretation.  Chart reviewed. I was asked by my colleague, Josseline to check in on Walter Hernandez and provide support to his wife, Walter Hernandez given his poor overall health state.   I met this morning with Walter Hernandez. A brief historical review was completed inclusive of PMH and acute conditions. I shared with Walter Hernandez that presently we are in a difficult situation as unfortunately, it appears that Walter Hernandez will not be able to recover from his metastatic cancer. She shares that no additional treatments will be provided.   We reviewed Walter Hernandez's complicated eight day hospitalization. Discussed his two intubations in the setting of seizures and ongoing dysphagia. Reviewed Walter Hernandez's tenuous mental state since admission.    Walter Hernandez states that this has all been very difficult and overwhelming  for her. I allowed her time to express her feelings and offered support through therapeutic listening. She expresses that she has been Walter Hernandez's care given and that no matter what the circumstances in the past have been that they have "gotten through". She states that the idea of him not going home to her is absolutley heart breaking.  While chatting, Dr. Posey Pronto came in. He provided Walter Hernandez with an update and shared that we can continue what we are doing or choose a more comfort oriented focus. He expressed that Walter Hernandez's conditions are not improving and it would be prudent to start making decisions regarding future actions pertaining to his care.  Walter Hernandez continued on that she would like to ideally bring Walter Hernandez home though right now she "doesn't know how she would do it." I asked Walter Hernandez is she would like me to set up a meeting with hospice so that we may gain a better understanding of what home with hospice versus [facility] would look like. She was in agreement with this.  Provided  "Gone From My Site" booklet.  After leaving the room, I was able to call Walter Hernandez and confirm a meeting time for 1300.  Time In: 0805 Time Out: 0910 Total Time: 65 minutes --> Greater than 50% of the time was spent in counseling and coordination of care _____________________________________________________________________  Addendum:  Jhonnie Garner of [facility] and I met with patients spouse, Walter Hernandez this afternoon. We reviewed the services that could be offered in the home with hospice inclusive of RN, Chaplain, MSW, and care aid. Reviewed that there would not be someone there with the patient 24/7.  Reviewed that all needed DM's would be delivered in advance. We also discussed that NGT feeds could be continued in the home environment is this was desired by the patients family.   We reviewed what hosp[facility] care at [facility] would look like which would entail 24/7 support. We discussed that this would allow for Walter Hernandez to focus on  being a wife as opposed to a caregiver. We reviewed that symptoms would be managed by oral medications administered SL versus transdermal. Walter Hernandez asked if her dog could come in for visits - she was told that yes her dog could visit but would not stay throughout the night. We discussed that the NGT would need to be discontinued if Walter Hernandez went to [facility] but if he wanted something to eat or drink he could certainly have that.   Walter Hernandez tried mentally during our conversation to conceptualize what home would look like versus [facility]. She raised questions in regards to comfort feeds and visitation. She expresses in conversation that she "knows I can't care for him at home." I shared with her that it may be best if she consider [facility] as Walter Hernandez's symptom management would be comprehensive and the stress would be off of her shoulder. [place], Solmon Ice was able to share that caring for him at home at this juncture would be very difficult and that she would really need to find the right match for his complex needs.   By the end of our conversation it was agreed to pursue a referral to [facility]. We agreed to continue all current care measures inclusive of NGT feedings until the day of discharge.   Time in: 1300 Time out: 1410 Additional Time: 70 minutes --> Greater than 50% of the time was spent in counseling and coordination of care  Objective Assessment: Vital Signs Vitals:   12/09/20 0733 12/09/20 1052  BP: 110/66 (!) 107/58  Pulse: 85 91  Resp: 14 19  Temp: 99.5 F (37.5 C) 98.6 F (37 C)  SpO2: 97% 98%    Intake/Output Summary (Last 24 hours) at 12/09/2020 1411 Last data filed at 12/09/2020 1052 Gross per 24 hour  Intake 65 ml  Output 1392 ml  Net -1327 ml   Last Weight  Most recent update: 12/05/2020  4:25 AM    Weight  71.7 kg (158 lb 1.1 oz)            Gen:  Older Caucasian M in NAD HEENT: Drymucous membranes, Coretrack in place CV: Regular rate and  rhythm, no murmurs rubs or gallops PULM: On RA ABD: soft/nontender  EXT: No edema  Neuro: Disoriented  SUMMARY OF RECOMMENDATIONS   DNAR/DNI  Plan for [place] referral  Patients wife, Walter Hernandez would like to continue all measures until day of discharge inclusive of NGT feeds  Ongoing PMT support  Time Spent: As above ______________________________________________________________________________________ [place] Team Team Cell Phone: [phone] Please utilize secure chat with additional questions, if there is no response within 30 minutes please call the above phone number  Palliative Medicine Team providers are available by phone from 7am to 7pm daily and can be reached through the team cell phone.  Should this patient require assistance outside of these hours, please call the patient's attending physician.

## 2020-12-09 NOTE — Progress Notes (Signed)
RN went to check on Pt, family at bedside. RN found cortrak out. RN notified MD. Due to Patient having new dys 1 diet thin liquids per MD leave cortrak out for now try diet and reassess in the morning.

## 2020-12-09 NOTE — Progress Notes (Signed)
Occupational Therapy Treatment + OT D/C Patient Details Name: Walter Hernandez MRN: [id number] DOB: 1951-06-06 Today's Date: 12/09/2020    History of present illness 70 yo male presented from home  status epilepticus on admission was intubated 6/28.  MRI new metastases to brainstem and L temporal lobe progression of lukoencephalopathy. Re-intubated 7/3 - 7/4.  PMH small cell lung CA with mets to the brain and liver HTN Stabe Ib superficial malanoma L LE,   OT comments  Pt fatigues quickly. Pt requires frequent reminders to keep from pulling at lines/leads/cortrak. Poor awareness of L side. Difficulty sequencing tasks, needing multi-modal step-by-step commands. nystagmus present with all positional changes. Pt reports blurriness, but not double vision. Pt does not require continued OT skilled services as pt most likely going home with comfort care. Home health optional if applicable. OT signing off. Equipment recommendations provided.   Follow Up Recommendations  Home health OT;Other (comment) (palliative)    Equipment Recommendations  Hospital bed;Other (comment);3 in 1 bedside commode (transport chair)    Recommendations for Other Services      Precautions / Restrictions Precautions Precautions: Fall Precaution Comments: seizure, cortrak, R mitten Restrictions Weight Bearing Restrictions: No       Mobility Bed Mobility Overal bed mobility: Needs Assistance Bed Mobility: Rolling;Sit to Supine;Sidelying to Sit Rolling: Max assist Sidelying to sit: Max assist;+2 for physical assistance   Sit to supine: +2 for physical assistance;Total assist   General bed mobility comments: Increased time and fatigues easily. Rounded head and shoulders at EOB; unable to lift head without discomfort.    Transfers                 General transfer comment: Deferred this date due to lethargy    Balance Overall balance assessment: Needs assistance Sitting-balance support: Feet  supported;Bilateral upper extremity supported Sitting balance-Leahy Scale: Poor Sitting balance - Comments: requires UE support and mod-maxA posteriorly for support, maintains a trunk and neck flexed posture despite repeated verbal and tactile cues to correct. Postural control: Posterior lean     Standing balance comment: Deferred this date due to lethargy                           ADL either performed or assessed with clinical judgement   ADL                                         General ADL Comments: totalA at this time. Pt fatigues quickly after 7 mins at EOB, pt resting in supine. Family arrived and unable to fully arouse pt.     Vision   Vision Assessment?: Vision impaired- to be further tested in functional context Additional Comments: nystagmus present with all positional changes. Pt reports blurriness, but not double vision.   Perception     Praxis      Cognition Arousal/Alertness: Lethargic Behavior During Therapy: Flat affect Overall Cognitive Status: Impaired/Different from baseline Area of Impairment: Safety/judgement;Awareness;Memory;Following commands;Attention;Problem solving                   Current Attention Level: Sustained Memory: Decreased short-term memory;Decreased recall of precautions Following Commands: Follows one step commands inconsistently;Follows one step commands with increased time Safety/Judgement: Decreased awareness of safety;Decreased awareness of deficits Awareness: Intellectual Problem Solving: Slow processing;Decreased initiation;Difficulty sequencing;Requires verbal cues;Requires tactile cues General Comments: Pt fattigues quickly. Requires frequent  reminders to keep from pulling at lines/leads/cortrak. Poor awareness of L side. Difficulty sequencing tasks, needing multi-modal step-by-step commands.        Exercises     Shoulder Instructions       General Comments Education for spouse for  edema management and to reduce pressure ulcers.    Pertinent Vitals/ Pain       Pain Assessment: Faces Faces Pain Scale: Hurts a little bit Pain Location: generalized with grimacing Pain Descriptors / Indicators: Grimacing  Home Living                                          Prior Functioning/Environment              Frequency  Min 2X/week        Progress Toward Goals  OT Goals(current goals can now be found in the care plan section)  Progress towards OT goals: Not progressing toward goals - comment (Pt d/c this session. Unable to fully participate today.)  Acute Rehab OT Goals Patient Stated Goal: to take patient home, per wife OT Goal Formulation: All assessment and education complete, DC therapy Potential to Achieve Goals: Poor  Plan Equipment recommendations need to be updated;Discharge plan needs to be updated    Co-evaluation    PT/OT/SLP Co-Evaluation/Treatment: Yes Reason for Co-Treatment: Complexity of the patient's impairments (multi-system involvement)   OT goals addressed during session: ADL's and self-care      AM-PAC OT "6 Clicks" Daily Activity     Outcome Measure   Help from another person eating meals?: Total Help from another person taking care of personal grooming?: Total Help from another person toileting, which includes using toliet, bedpan, or urinal?: Total Help from another person bathing (including washing, rinsing, drying)?: Total Help from another person to put on and taking off regular upper body clothing?: Total Help from another person to put on and taking off regular lower body clothing?: Total 6 Click Score: 6    End of Session Equipment Utilized During Treatment: Oxygen  OT Visit Diagnosis: Unsteadiness on feet (R26.81);Muscle weakness (generalized) (M62.81)   Activity Tolerance Patient limited by fatigue   Patient Left in bed;with call bell/phone within reach;with bed alarm set;with family/visitor  present;with restraints reapplied   Nurse Communication Mobility status;Precautions        Time: 9379-0240 OT Time Calculation (min): 27 min  Charges: OT General Charges $OT Visit: 1 Visit OT Treatments $Therapeutic Activity: 8-22 mins  Jefferey Pica, OTR/L Acute Rehabilitation Services Pager: [phone] Office: [place] 12/09/2020, 8:12 PM

## 2020-12-09 NOTE — Progress Notes (Signed)
Patient is currently admitted and plan is to dc to home with hospice or to [facility]. Called and spoke with Walter Hernandez. Provided emotional support. Supported her in her decisions to pursue comfort care/no code status. 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 Listened to her concerns and thought processes. She knows she can call me if she has any other needs or concerns.   Oncology Nurse Navigator Documentation  Oncology Nurse Navigator Flowsheets 12/09/2020  Abnormal Finding Date -  Confirmed Diagnosis Date -  Diagnosis Status -  Planned Course of Treatment -  Phase of Treatment -  Chemotherapy Actual Start Date: -  Radiation Actual Start Date: -  Navigator Follow Up Date: -  Navigator Follow Up Reason: -  Production assistant, radio Encounter Type Telephone  Telephone Patient Update  Treatment Initiated Date -  Patient Visit Type MedOnc  Treatment Phase Other  Barriers/Navigation Needs Coordination of Care;Education  Education Other  Interventions Education;Psycho-Social Support  Acuity Level 2-Minimal Needs (1-2 Barriers Identified)  Referrals -  Coordination of Care -  Education Method Verbal  Support Groups/Services Friends and Family  Time Spent with Patient 30

## 2020-12-09 NOTE — Progress Notes (Signed)
Physical Therapy Treatment & Discharge Patient Details Name: Walter Hernandez MRN: [id number] DOB: May 19, 1951 Today's Date: 12/09/2020    History of Present Illness 70 yo male presented from home  status epilepticus on admission was intubated 6/28.  MRI new metastases to brainstem and L temporal lobe progression of lukoencephalopathy. Re-intubated 7/3 - 7/4.  PMH small cell lung CA with mets to the brain and liver HTN Stabe Ib superficial malanoma L LE,    PT Comments    Pt is displaying decreased activation with all functional mobility, needing maxA-Tax2 for bed mobility and up to mod-maxA for posterior support with sitting at EOB. Pt lethargic and verbalizing intermittently during session. Pt following simple commands with delayed processing. Pt with minimal to no L UE activation noted throughout the session. Pt with reported dizziness and noted possible vertical nystagmus with all mobility this date, RN notified. Educated pt's wife on repositioning the pt regularly (at least every 2 hours) to reduce his risk for pressure ulcers. Pt's wife is considering comfort care, but does desire to take pt home if able. Thus, recommend the following equipment mentioned below to maximize pt's quality of life and reduce caregiver burden. Recommend max HH services also. Acute PT services will sign off at this time.     Follow Up Recommendations  Other (comment) (max HH services to maintain quality of life)     Equipment Recommendations  Wheelchair (measurements PT);Wheelchair cushion (measurements PT);Hospital bed;3in1 (PT);Other (comment) (lift and lift pad; tilt in space wheelchair)    Recommendations for Other Services       Precautions / Restrictions Precautions Precautions: Fall Precaution Comments: seizure, cortrak, R mitten Restrictions Weight Bearing Restrictions: No    Mobility  Bed Mobility Overal bed mobility: Needs Assistance Bed Mobility: Rolling;Sit to Supine;Sidelying to Sit Rolling:  Max assist Sidelying to sit: Max assist;+2 for physical assistance   Sit to supine: +2 for physical assistance;Total assist   General bed mobility comments: Pt needing increased time and max cues to initiate all bed mobility. MaxA to roll and position pt with use of pillows to reduce pressure. MaxAx2 to sit up EOB, with pt initiating leg movement but needing assistance to complete, particularly at the L leg. Needed assistance to ascend trunk. TAx2 to return to supine as pt fatigued sitting up EOB.    Transfers                 General transfer comment: Deferred this date due to lethargy  Ambulation/Gait             General Gait Details: Deferred this date due to lethargy   Stairs             Wheelchair Mobility    Modified Rankin (Stroke Patients Only)       Balance Overall balance assessment: Needs assistance Sitting-balance support: Feet supported;Bilateral upper extremity supported Sitting balance-Leahy Scale: Poor Sitting balance - Comments: requires UE support and mod-maxA posteriorly for support, maintains a trunk and neck flexed posture despite repeated verbal and tactile cues to correct. Postural control: Posterior lean     Standing balance comment: Deferred this date due to lethargy                            Cognition Arousal/Alertness: Lethargic Behavior During Therapy: Flat affect Overall Cognitive Status: Impaired/Different from baseline Area of Impairment: Safety/judgement;Awareness;Memory;Following commands;Attention;Problem solving  Current Attention Level: Sustained Memory: Decreased short-term memory;Decreased recall of precautions Following Commands: Follows one step commands inconsistently;Follows one step commands with increased time Safety/Judgement: Decreased awareness of safety;Decreased awareness of deficits Awareness: Intellectual Problem Solving: Slow processing;Decreased  initiation;Difficulty sequencing;Requires verbal cues;Requires tactile cues General Comments: Pt continually needing to be redirected not to pull at cortrak. Pt lethargic, falling asleep quickly once returned to supine at end of session, but able to remain awake during session. Pt able to follow simple commands with increased time inconsistently. Poor awareness of L side. Difficulty sequencing tasks, needing multi-modal step-by-step commands.      Exercises      General Comments General comments (skin integrity, edema, etc.): Noted nystagmus of eyes (possibly vertically) with pt reporting dizziness with all movements; educated pt's wife on turning pt at least every 2 hours to reduce pressure and prevent pressure ulcers; educated pt's wife that PT will place order for equipment in case she decides to take pt home      Pertinent Vitals/Pain Pain Assessment: Faces Faces Pain Scale: Hurts a little bit Pain Location: generalized with grimacing Pain Descriptors / Indicators: Grimacing Pain Intervention(s): Limited activity within patient's tolerance;Monitored during session;Repositioned    Home Living                      Prior Function            PT Goals (current goals can now be found in the care plan section) Acute Rehab PT Goals Patient Stated Goal: to take patient home, per wife PT Goal Formulation: With patient/family Time For Goal Achievement: 12/19/20 Potential to Achieve Goals: Poor Progress towards PT goals: Not progressing toward goals - comment    Frequency    Min 3X/week      PT Plan Discharge plan needs to be updated;Equipment recommendations need to be updated    Co-evaluation PT/OT/SLP Co-Evaluation/Treatment: Yes Reason for Co-Treatment: Complexity of the patient's impairments (multi-system involvement);For patient/therapist safety;To address functional/ADL transfers          AM-PAC PT "6 Clicks" Mobility   Outcome Measure  Help needed turning  from your back to your side while in a flat bed without using bedrails?: A Lot Help needed moving from lying on your back to sitting on the side of a flat bed without using bedrails?: Total Help needed moving to and from a bed to a chair (including a wheelchair)?: Total Help needed standing up from a chair using your arms (e.g., wheelchair or bedside chair)?: Total Help needed to walk in hospital room?: Total Help needed climbing 3-5 steps with a railing? : Total 6 Click Score: 7    End of Session   Activity Tolerance: Patient limited by lethargy Patient left: in bed;with call bell/phone within reach;with bed alarm set;with family/visitor present;with restraints reapplied Nurse Communication: Mobility status PT Visit Diagnosis: Other abnormalities of gait and mobility (R26.89);Muscle weakness (generalized) (M62.81);Difficulty in walking, not elsewhere classified (R26.2);Other symptoms and signs involving the nervous system (R29.898)     Time: 7564-3329 PT Time Calculation (min) (ACUTE ONLY): 28 min  Charges:  $Therapeutic Activity: 8-22 mins                     Moishe Spice, PT, DPT Acute Rehabilitation Services  Pager: [phone] Office: [place] 12/09/2020, 1:25 PM

## 2020-12-09 NOTE — Evaluation (Signed)
Clinical/Bedside Swallow Evaluation Patient Details  Name: Walter Hernandez MRN: [id number] Date of Birth: September 25, 1950  Today's Date: 12/09/2020 Time: SLP Start Time (ACUTE ONLY): 1448 SLP Stop Time (ACUTE ONLY): 1518 SLP Time Calculation (min) (ACUTE ONLY): 30 min  Past Medical History:  Past Medical History:  Diagnosis Date   Cancer ([place])    recently discovered melanoma   GERD (gastroesophageal reflux disease)    Goals of care, counseling/discussion 06/19/2019   History of hiatal hernia    Hypertension    recently placed on new blood pressure med   Malignant melanoma of skin of left lower leg ([place]) 07/28/2015   Past Surgical History:  Past Surgical History:  Procedure Laterality Date   BRONCHIAL NEEDLE ASPIRATION BIOPSY  06/05/2019   Procedure: Bronchial Needle Aspiration Biopsies;  Surgeon: Candee Furbish, MD;  Location: [place];  Service: Thoracic;;   CARDIAC CATHETERIZATION     15 yrs ago...came out normal   -- hiatal hernia   COLONOSCOPY  2012   Dr Earlean Shawl    ENDOBRONCHIAL ULTRASOUND N/A 06/11/2019   Procedure: ENDOBRONCHIAL ULTRASOUND;  Surgeon: Garner Nash, DO;  Location: [place];  Service: Endoscopy;  Laterality: N/A;   ESOPHAGOGASTRODUODENOSCOPY  2012   Dr Earlean Shawl. has a really bad hiatal hernia    FINE NEEDLE ASPIRATION  06/11/2019   Procedure: FINE NEEDLE ASPIRATION (FNA) LINEAR;  Surgeon: Garner Nash, DO;  Location: [place];  Service: Endoscopy;;   IR IMAGING GUIDED PORT INSERTION  07/03/2019   MELANOMA EXCISION WITH SENTINEL LYMPH NODE BIOPSY Left 09/29/2014   Procedure: WIDE EXCISION MELANOMA LEFT CALF WITH LEFT INGUINAL  SENTINEL LYMPH NODE BIOPSY;  Surgeon: Georganna Skeans, MD;  Location: [place];  Service: General;  Laterality: Left;   skin lesion excised     non cancerous   VIDEO BRONCHOSCOPY N/A 06/11/2019   Procedure: VIDEO BRONCHOSCOPY WITHOUT FLUORO;  Surgeon: Garner Nash, DO;  Location: [place];  Service: Endoscopy;  Laterality: N/A;   VIDEO  BRONCHOSCOPY WITH ENDOBRONCHIAL ULTRASOUND N/A 06/05/2019   Procedure: VIDEO BRONCHOSCOPY WITH ENDOBRONCHIAL ULTRASOUND;  Surgeon: Candee Furbish, MD;  [facility];  Service: Thoracic;  Laterality: N/A;   HPI:  70 year old man with small cell lung cancer with mets to brain and liver who was brought to the emergency department with altered mental status, left-sided weakness history of seizure not long ago and aphasia.  With MRI brain negative for acute stroke, continuous EEGs. ETT 6/28-6/29. Swallow was evaluated 7/2 with recs for purees/nectar thick liquids; emergent intubation 7/3-7/4.  Walter Hernandez and his wife have met with Palliative Medicine to help them with complex medical decision-making.   Assessment / Plan / Recommendation Clinical Impression  Pt's swallow function re-assessed today. He demonstrates improvements since initial evaluation on 7/2. He is more alert, asking for water and he is interactive.  He demonstrates mild prolonged oral preparation of purees, the appearance of a brisk swallow response, and no s/s of aspiration. Dysphagia appears to be mostly resolved. Recommend allowing dysphagia 1/pureed consistencies; thin liquids (straw or cup); give meds in applesauce when indicated.  If dentures are available, pt can try regular or soft solids. No further SLP f/u is needed.  Advance diet per pt's preferences. SLP Visit Diagnosis: Dysphagia, unspecified (R13.10)    Aspiration Risk  No limitations    Diet Recommendation   Start with dysphagia 1/pureed, thin liquids and advance per preferences  Medication Administration: Whole meds with puree    Other  Recommendations Oral Care  Recommendations: Oral care BID   Follow up Recommendations    N/a      Swallow Study   General Date of Onset: 11/30/20 HPI: 70 year old man with small cell lung cancer with mets to brain and liver who was brought to the emergency department with altered mental status, left-sided weakness history of  seizure not long ago and aphasia.  With MRI brain negative for acute stroke, continuous EEGs. ETT 6/28-6/29. Swallow was evaluated 7/2 with recs for purees/nectar thick liquids; emergent intubation 7/3-7/4.  Walter Hernandez and his wife have met with Palliative Medicine to help them with complex medical decision-making. Type of Study: Bedside Swallow Evaluation Diet Prior to this Study: NPO;NG Tube Temperature Spikes Noted: No Respiratory Status: Room air History of Recent Intubation: Yes Length of Intubations (days):  (6/28-29; 7/3-4) Date extubated: 12/07/20 Behavior/Cognition: Alert;Confused Oral Cavity Assessment: Within Functional Limits Oral Care Completed by SLP: Yes Oral Cavity - Dentition: Dentures, not available Vision: Functional for self-feeding Self-Feeding Abilities: Needs assist Patient Positioning: Upright in bed Baseline Vocal Quality: Normal Volitional Swallow: Able to elicit    Oral/Motor/Sensory Function Overall Oral Motor/Sensory Function: Within functional limits   Ice Chips Ice chips: Within functional limits   Thin Liquid Thin Liquid: Within functional limits Presentation: Straw    Nectar Thick Nectar Thick Liquid: Not tested   Honey Thick Honey Thick Liquid: Not tested   Puree Puree: Impaired Presentation: Spoon Oral Phase Functional Implications: Prolonged oral transit   Solid     Solid: Not tested      Walter Hernandez 12/09/2020,3:22 PM Estill Bamberg L. Tivis Ringer, [place] Office number [phone] Pager [phone]

## 2020-12-10 DIAGNOSIS — G9341 Metabolic encephalopathy: Secondary | ICD-10-CM | POA: Diagnosis not present

## 2020-12-10 DIAGNOSIS — Z7189 Other specified counseling: Secondary | ICD-10-CM | POA: Diagnosis not present

## 2020-12-10 DIAGNOSIS — J9601 Acute respiratory failure with hypoxia: Secondary | ICD-10-CM | POA: Diagnosis not present

## 2020-12-10 DIAGNOSIS — R569 Unspecified convulsions: Secondary | ICD-10-CM | POA: Diagnosis not present

## 2020-12-10 DIAGNOSIS — Z515 Encounter for palliative care: Secondary | ICD-10-CM | POA: Diagnosis not present

## 2020-12-10 NOTE — Progress Notes (Signed)
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 Presented to hospital with confusion.  Found to have status epilepticus was intubated and was with ICU. 6/28 admitted with seizure 6/29 EEG negative for seizures.  Extubated 7/1 Transfer to hospital service 7/2-7/3 Overnight on, intubated again due to encephalopathy. 7/4 extubated again, initial plan was for comfort care but later on wife changed back to full code. 7/5 patient back to DNR/DNI.  Goal of care is to treat what is treatable in the hospital.  Hospice on discharge.  Currently plan is monitor continue current level of care and transfer [facility] when a bed is available.  Assessment and Plan: 1.  Status epilepticus Metastatic non-small cell cancer with brain metastasis. Acute metabolic encephalopathy Presented with confusion. Found to have status epilepticus. Neurology was consulted. Patient was admitted to the ICU. On Keppra 500 mg twice daily, dose increased to 1000 mg twice daily.  Long-term EEG monitoring did not show any ongoing seizures. Keppra dose reduced to 500 mg twice daily again. Patient was also started on Dilantin. Received 1 dose of Vimpat On 7/3 night mentation worsened again requiring intubation.  Extubated on 7/4. Concern with aspiration risk, wife aware. He has NG tube. PT OT and speech following. Continue to monitor in the progressive care unit.  2.  Stage Ib melanoma. Small cell lung cancer with metastasis. Discussed with oncology. Neurology discussed with neuro oncology.   Not a good candidate for further radiation therapy. Patient performance status has been worsening therefore not a good candidate for chemotherapy as well. Palliative care  consulted for goals of care conversation.  3.  Dysphagia. Speech therapy consulted. Currently has NG tube. Currently on dysphagia 1 diet thin liquid.  4.  Acute respiratory failure with hypoxia, present on admission. Required intubation on admission. Secondary to seizures. Currently on room air. Monitor.  Chest x-ray negative for any acute abnormality.  5.  Hyponatremia, hypomagnesemia Currently corrected. Monitor.  6.  Acute kidney injury on CKD 3A. Renal function improved with IV hydration. Likely pre-renal etiology Monitor.  Avoid nephrotoxic medication.  7.  Anemia and thrombocytopenia secondary to chemotherapy as well as nutritional deficiency. Currently H&H and platelets are stable. Monitor.  8.  Goals of care conversation. Multiple discussion with wife.  Patient with poor prognosis with 2 malignancy.  Not a candidate for chemotherapy or further radiation now with status epilepticus and recurrent encephalopathy requiring intubation for airway protection. Based on multiple discussion, currently goal is to remain DNR/DNI. Continue to treat what is treatable condition Continue seizure medication Transfer to [facility] for comfort care. Pulled out core track.  Currently on dysphagia 1 diet thin liquid. Foley catheter continued.  Initially placed for acute retention.  Currently continued for end-of-life care.  Body mass index is 24.76 kg/m.  Nutrition Problem: Increased nutrient needs Etiology: cancer and cancer related treatments Interventions: Interventions: Tube feeding, Prostat, MVI      Diet: Dysphagia 1 diet.  NPO. DVT Prophylaxis:   enoxaparin (LOVENOX) injection 40 mg Start: 12/03/20 1000    Advance goals of care discussion: DNR/DNI  Family Communication: family was present at bedside, at the time of interview.  Opportunity was given to ask question and all questions were answered satisfactorily.   Disposition:  Status is: Inpatient  Remains  inpatient appropriate because:IV treatments appropriate due to intensity of illness or inability to take PO and Inpatient level of care appropriate due to severity of illness  Dispo: The patient is from: Home              Anticipated d/c is to: SNF              Patient currently is not medically stable to d/c.   Difficult to place patient No   Subjective: Alert.  More tired.  No nausea no vomiting.  Wants to drink water and coffee. No abdominal pain.  Physical Exam:  General: Appear in mild distress, no Rash; Oral Mucosa Clear, moist. no Abnormal Neck Mass Or lumps, Conjunctiva normal  Cardiovascular: S1 and S2 Present, no Murmur, Respiratory: good respiratory effort, Bilateral Air entry present and CTA, no Crackles, no wheezes Abdomen: Bowel Sound present, Soft and no tenderness Extremities: no Pedal edema Neurology: alert and oriented to  person affect appropriate. no new focal deficit Gait not checked due to patient safety concerns  Vitals:   12/10/20 0412 12/10/20 0739 12/10/20 1113 12/10/20 1444  BP: (!) 145/82 134/83 124/72 117/81  Pulse: 71 75 81 93  Resp: 16 13 14 18   Temp: 99.3 F (37.4 C) 98 F (36.7 C) 98.2 F (36.8 C) 98.2 F (36.8 C)  TempSrc: Oral Axillary Oral Oral  SpO2: 97% 97% 98% 96%  Weight:      Height:       Filed Weights   12/04/20 1230 12/04/20 1300 12/05/20 0400  Weight: 65.8 kg 65.9 kg 71.7 kg    Data Reviewed: I have personally reviewed and interpreted daily labs, tele strips, imaging. I reviewed all nursing notes, pharmacy notes, vitals, pertinent old records I have discussed plan of care as described above with RN and patient/family.  CBC: Recent Labs  Lab 12/04/20 1030 12/05/20 0551 12/06/20 0217 12/06/20 0411 12/07/20 1222 12/09/20 0530  WBC 10.9* 9.4  --   --  7.8 5.1  NEUTROABS 9.2*  --   --   --   --   --   HGB 9.9* 10.3* 11.2* 10.2* 8.8* 8.4*  HCT 29.8* 31.3* 33.0* 30.0* 27.4* 24.8*  MCV 99.0 98.1  --   --  101.5* 99.6   PLT 100* 125*  --   --  107* 105*    Basic Metabolic Panel: Recent Labs  Lab 12/04/20 0625 12/04/20 1030 12/04/20 1030 12/05/20 0551 12/06/20 0217 12/06/20 0309 12/06/20 0411 12/07/20 1222 12/09/20 0530  NA  --  135   < > 136 135 137 137 138 144  K  --  4.5   < > 4.4 5.9* 6.2* 5.5* 4.5 4.3  CL  --  104  --  102  --  102  --  106 109  CO2  --  26  --  27  --  26  --  26 28  GLUCOSE  --  176*  --  151*  --  113*  --  110* 154*  BUN  --  20  --  21  --  34*  --  44* 38*  CREATININE  --  1.32*  --  1.20  --  1.89*  --  1.60* 1.45*  CALCIUM  --  8.7*  --  9.0  --  8.6*  --  8.5* 8.9  MG 1.9  --   --  1.6*  --   --   --  1.4* 1.5*  PHOS 3.1  --   --   --   --   --   --   --   --    < > = values in this interval not displayed.     Studies: No results found.  Scheduled Meds:  chlorhexidine  15 mL Mouth Rinse BID   chlorhexidine gluconate (MEDLINE KIT)  15 mL Mouth Rinse BID   Chlorhexidine Gluconate Cloth  6 each Topical Daily   dexamethasone (DECADRON) injection  4 mg Intravenous Q8H   dextrose  50 mL Intravenous Once   enoxaparin (LOVENOX) injection  40 mg Subcutaneous Q24H   mouth rinse  15 mL Mouth Rinse q12n4p   phenytoin (DILANTIN) IV  100 mg Intravenous BID   phenytoin (DILANTIN) IV  75 mg Intravenous Q1400   sodium chloride flush  10-40 mL Intracatheter Q12H   Continuous Infusions:  famotidine (PEPCID) IV 20 mg (12/10/20 4628)   levETIRAcetam 500 mg (12/10/20 1026)   PRN Meds: acetaminophen, ipratropium-albuterol, LORazepam, sodium chloride flush  Time spent: 35 minutes  Author: Berle Mull, MD [facility] 12/10/2020 7:17 PM  To reach On-call, see care teams to locate the attending and reach out via [email or web address]. Between 7PM-7AM, please contact night-coverage If you still have difficulty reaching the attending provider, please page the [facility] (Director on Call) for [facility] on amion for assistance.

## 2020-12-10 NOTE — Progress Notes (Signed)
Nutrition Brief Note  Chart reviewed. Plan is for pt to discharge to [facility] once bed becomes available. Cortrak NG tube was pulled out overnight. Pt on a dysphagia 1 diet with thin liquids. Per discussion with MD, pt's wife agrees with not having Cortrak replaced. No further nutrition interventions planned at this time.  Please re-consult as needed.    Gustavus Bryant, MS, RD, LDN Inpatient Clinical Dietitian Please see AMiON for contact information.

## 2020-12-10 NOTE — Progress Notes (Addendum)
Spoke with Misty at [facility] bed - per [facility] No bed available for patient today - will continue to monitor and will contact us when bed available to transition to [facility]. Misty states she will reach out to wife to let her know as well.

## 2020-12-10 NOTE — Progress Notes (Signed)
   Palliative Medicine Inpatient Follow Up Note  Patient Profile/HPI:   70 y.o. 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 Patient was diagnosed with small cell lung cancer with mets to the brain and liver in December of 2020 and started chemotherapy in February of 2021. An MRI in February of 2022 showed worsening brain mets. He underwent salvage SRS on 2/16. He was admitted for seizures on 09/05/20. MRI in march of 2022 showed worsening brain mets again. He underwent SRS a second time on 10/05/2020. In his last visit with Dr. Marin Olp on 11/11/2020 chemotherapy was stopped due to the patient having some difficulty with the thearpy. This admission, CT head was negative for acute abnormality and MRI was negative for acute infarct. Multiple medications given for seizure activity. Patient experienced acute respiratory failure and required intubation. PMT consulted to discuss [place].   Today's Discussion (12/10/2020):  Chart reviewed. Discussed with RN Delcie Roch and assessed patient at the bedside. Walter Hernandez is pleasantly confused. His wife Colletta Maryland and daughter Benjamine Mola are present at the bedside.  Colletta Maryland reports that she is pleased with the care Walter Hernandez is receiving. She has not had much of a chance to discuss Walter Hernandez's condition at length with their daughter Benjamine Mola, who is visiting from [place], [place]. [place] notes that Walter Hernandez ate a few small bites of his breakfast this morning and she plans to gauge his interest in lunch soon. Recommended following his lead and focusing on his enjoyment of foods rather than forcing him to eat. Elizabeth verbalizes her understanding. Colletta Maryland is still overwhelmed, leaning on family for support.  Questions and concerns addressed. Family was encouraged to call with additional needs. PMT will continue to support holistically.   Objective Assessment: Vital Signs Vitals:   12/10/20 0739  12/10/20 1113  BP: 134/83 124/72  Pulse: 75 81  Resp: 13 14  Temp: 98 F (36.7 C) 98.2 F (36.8 C)  SpO2: 97% 98%    Intake/Output Summary (Last 24 hours) at 12/10/2020 1337 Last data filed at 12/10/2020 0740 Gross per 24 hour  Intake --  Output 1650 ml  Net -1650 ml    Last Weight  Most recent update: 12/05/2020  4:25 AM    Weight  71.7 kg (158 lb 1.1 oz)            Gen:  Older Caucasian M in NAD HEENT: dry mucous membranes CV: Regular rate and rhythm PULM: On RA, normal respiratory effort ABD: soft/nontender  EXT: No edema  Neuro: Disoriented  SUMMARY OF RECOMMENDATIONS   DNAR/DNI  Patients wife, Colletta Maryland would like to continue all measures until day of discharge  Stable for transfer to [facility] when a bed is available  Ongoing PMT support  Time Spent: 15 minutes Greater than 50% of this time was spent in counseling and coordinating of care  Espiridion Supinski Burt Knack, [place] Team Team Cell Phone: [phone] Please utilize secure chat with additional questions, if there is no response within 30 minutes please call the above phone number  Palliative Medicine Team providers are available by phone from 7am to 7pm daily and can be reached through the team cell phone.  Should this patient require assistance outside of these hours, please call the patient's attending physician.

## 2020-12-11 DIAGNOSIS — N400 Enlarged prostate without lower urinary tract symptoms: Secondary | ICD-10-CM

## 2020-12-11 DIAGNOSIS — I10 Essential (primary) hypertension: Secondary | ICD-10-CM

## 2020-12-11 DIAGNOSIS — R569 Unspecified convulsions: Secondary | ICD-10-CM | POA: Diagnosis not present

## 2020-12-11 DIAGNOSIS — C3490 Malignant neoplasm of unspecified part of unspecified bronchus or lung: Secondary | ICD-10-CM | POA: Diagnosis not present

## 2020-12-11 DIAGNOSIS — G9341 Metabolic encephalopathy: Secondary | ICD-10-CM | POA: Diagnosis not present

## 2020-12-11 DIAGNOSIS — N1831 Chronic kidney disease, stage 3a: Secondary | ICD-10-CM

## 2020-12-11 DIAGNOSIS — Z7189 Other specified counseling: Secondary | ICD-10-CM | POA: Diagnosis not present

## 2020-12-11 LAB — COMPREHENSIVE METABOLIC PANEL
ALT: 24 U/L (ref 0–44)
AST: 21 U/L (ref 15–41)
Albumin: 3 g/dL — ABNORMAL LOW (ref 3.5–5.0)
Alkaline Phosphatase: 86 U/L (ref 38–126)
Anion gap: 8 (ref 5–15)
BUN: 29 mg/dL — ABNORMAL HIGH (ref 8–23)
CO2: 23 mmol/L (ref 22–32)
Calcium: 9.3 mg/dL (ref 8.9–10.3)
Chloride: 109 mmol/L (ref 98–111)
Creatinine, Ser: 1.38 mg/dL — ABNORMAL HIGH (ref 0.61–1.24)
GFR, Estimated: 55 mL/min — ABNORMAL LOW (ref >60)
Glucose, Bld: 98 mg/dL (ref 70–99)
Potassium: 5.1 mmol/L (ref 3.5–5.1)
Sodium: 140 mmol/L (ref 135–145)
Total Bilirubin: 0.6 mg/dL (ref 0.3–1.2)
Total Protein: 6 g/dL — ABNORMAL LOW (ref 6.5–8.1)

## 2020-12-11 LAB — CBC
HCT: 27.4 % — ABNORMAL LOW (ref 39.0–52.0)
Hemoglobin: 9.3 g/dL — ABNORMAL LOW (ref 13.0–17.0)
MCH: 33.3 pg (ref 26.0–34.0)
MCHC: 33.9 g/dL (ref 30.0–36.0)
MCV: 98.2 fL (ref 80.0–100.0)
Platelets: 120 10*3/uL — ABNORMAL LOW (ref 150–400)
RBC: 2.79 MIL/uL — ABNORMAL LOW (ref 4.22–5.81)
RDW: 17.3 % — ABNORMAL HIGH (ref 11.5–15.5)
WBC: 6 10*3/uL (ref 4.0–10.5)
nRBC: 0 % (ref 0.0–0.2)

## 2020-12-11 LAB — PHENYTOIN LEVEL, TOTAL: Phenytoin Lvl: 12.5 ug/mL (ref 10.0–20.0)

## 2020-12-11 LAB — MAGNESIUM: Magnesium: 1.4 mg/dL — ABNORMAL LOW (ref 1.7–2.4)

## 2020-12-11 MED ORDER — MAGNESIUM SULFATE 2 GM/50ML IV SOLN
2.0000 g | Freq: Once | INTRAVENOUS | Status: AC
  Administered 2020-12-11: 2 g via INTRAVENOUS
  Filled 2020-12-11: qty 50

## 2020-12-11 NOTE — Progress Notes (Signed)
MEDICATION RELATED CONSULT NOTE - INITIAL   Pharmacy Consult for Phenytoin Indication: Seizures  Allergies  Allergen Reactions   Other Itching and Other (See Comments)    Seasonal allergies: Sinus headaches, sneezing, itchy eyes    Patient Measurements: Height: 5' 7"  (170.2 cm) Weight: 71.7 kg (158 lb 1.1 oz) IBW/kg (Calculated) : 66.1  Vital Signs: Temp: 97.5 F (36.4 C) (07/08 0735) Temp Source: Axillary (07/08 0735) BP: 115/61 (07/08 0735) Pulse Rate: 76 (07/08 0735) Intake/Output from previous day: 07/07 0701 - 07/08 0700 In: -  Out: 2950 [Urine:2950] Intake/Output from this shift: No intake/output data recorded.  Labs: Recent Labs    12/09/20 0530 12/11/20 0442  WBC 5.1 6.0  HGB 8.4* 9.3*  HCT 24.8* 27.4*  PLT 105* 120*  CREATININE 1.45* 1.38*  MG 1.5* 1.4*  ALBUMIN  --  3.0*  PROT  --  6.0*  AST  --  21  ALT  --  24  ALKPHOS  --  86  BILITOT  --  0.6    Estimated Creatinine Clearance: 45.9 mL/min (A) (by C-G formula based on SCr of 1.38 mg/dL (H)).   Microbiology: Recent Results (from the past 720 hour(s))  SARS CORONAVIRUS 2 (TAT 6-24 HRS) Nasopharyngeal Nasopharyngeal Swab     Status: None   Collection Time: 12/01/20 11:45 AM   Specimen: Nasopharyngeal Swab  Result Value Ref Range Status   SARS Coronavirus 2 NEGATIVE NEGATIVE Final    Comment: (NOTE) SARS-CoV-2 target nucleic acids are NOT DETECTED.  The SARS-CoV-2 RNA is generally detectable in upper and lower respiratory specimens during the acute phase of infection. Negative results do not preclude SARS-CoV-2 infection, do not rule out co-infections with other pathogens, and should not be used as the sole basis for treatment or other patient management decisions. Negative results must be combined with clinical observations, patient history, and epidemiological information. The expected result is Negative.  Fact Sheet for Patients: SugarRoll.be  Fact Sheet  for Healthcare Providers: https://www.woods-mathews.com/  This test is not yet approved or cleared by the [place] FDA and  has been authorized for detection and/or diagnosis of SARS-CoV-2 by FDA under an Emergency Use Authorization (EUA). This EUA will remain  in effect (meaning this test can be used) for the duration of the COVID-19 declaration under Se ction 564(b)(1) of the Act, 21 U.S.C. section 360bbb-3(b)(1), unless the authorization is terminated or revoked sooner.  Performed at [facility], [place] [address] [place], [place] [address]   MRSA Next Gen by PCR, Nasal     Status: Abnormal   Collection Time: 12/01/20  5:06 PM   Specimen: Nasal Mucosa; Nasal Swab  Result Value Ref Range Status   MRSA by PCR Next Gen DETECTED (A) NOT DETECTED Final    Comment: RESULT CALLED TO, READ BACK BY AND VERIFIED WITH: RN DUSTINE. [phone] FCP (NOTE) The GeneXpert MRSA Assay (FDA approved for NASAL specimens only), is one component of a comprehensive MRSA colonization surveillance program. It is not intended to diagnose MRSA infection nor to guide or monitor treatment for MRSA infections. Test performance is not FDA approved in patients less than 32 years old. Performed at [facility], [place] [address], [place], [place] [address]     Medical History: Past Medical History:  Diagnosis Date   Cancer [facility])    recently discovered melanoma   GERD (gastroesophageal reflux disease)    Goals of care, counseling/discussion 06/19/2019   History of hiatal hernia  Hypertension    recently placed on new blood pressure med   Malignant melanoma of skin of left lower leg (HCC) 07/28/2015    Medications:  Scheduled:   chlorhexidine  15 mL Mouth Rinse BID   chlorhexidine gluconate (MEDLINE KIT)  15 mL Mouth Rinse BID   Chlorhexidine Gluconate Cloth  6 each Topical Daily   dexamethasone (DECADRON) injection  4 mg Intravenous Q8H   dextrose  50 mL Intravenous Once    enoxaparin (LOVENOX) injection  40 mg Subcutaneous Q24H   mouth rinse  15 mL Mouth Rinse q12n4p   phenytoin (DILANTIN) IV  100 mg Intravenous BID   phenytoin (DILANTIN) IV  75 mg Intravenous Q1400   sodium chloride flush  10-40 mL Intracatheter Q12H    Assessment: 70 years of age male with SCLC with metastases to the brain and liver with possible seizures. Currently on Keppra IV (initially increased but now back to home dose due to concerns for contribution to agitation with higher doses) and Dilantin IV. Dilantin level today is therapeutic.   Dilantin levels and Course of admission: 6/28 DPH 22.8 mcg/mL post load 6/30 DPH 18.9, albumin 2.8 >> corrected to 28.6 - only checked d/t neuro status 7/3 Noted change in mentation, Keppra 1g IV x1.  CT negative, no seizures. 7/4 DPH/albumin 16.5/2.7 >> corrects to 25.78 - dose was reduced by 25 mg 7/8 DPH/albumin 12.5/ alb 3 >> corrects to 17.8 (therapeutic) - continue current dose   Goal of Therapy:  Dilantin level 10-20 mcg/mL  Plan:  Continue with reduced dose of Dilantin 100 mg IV AM, 75 mg IV noon, and 100 mg IV PM (reduced on 7/4).  Monitor renal function.  Albertina Parr, PharmD., BCPS, BCCCP Clinical Pharmacist Please refer to [facility] for unit-specific pharmacist

## 2020-12-11 NOTE — Progress Notes (Signed)
PROGRESS NOTE    Walter Hernandez  FGH:[id number] DOB: 1950-06-13 DOA: 12/01/2020 PCP: Lavone Orn, MD   Brief Narrative: Walter Hernandez is a 70 y.o. male with a history of metastatic small cell lung cancer with brain metastasis s/p chemoradiation, hypertension, melanoma. Patient presented secondary to confusion with concern for status epilepticus. He required intubation and ICU admission for treatment. Currently on Keppra and Dilantin.   Assessment & Plan:   Principal Problem:   Seizure ([place]) Active Problems:   Malignant melanoma of skin of left lower leg (HCC)   Essential hypertension   GERD (gastroesophageal reflux disease)   Extensive stage primary small cell carcinoma of lung (HCC) with known metastasis to brain   Goals of care, counseling/discussion   Acute metabolic encephalopathy with right sided weakness and acute aphasia    Benign prostatic hyperplasia without lower urinary tract symptoms   Thrombocytopenia (HCC)   Acute respiratory failure with hypoxia (HCC)   CKD (chronic kidney disease) stage 3, GFR 30-59 ml/min (HCC)   Alteration in electrolyte and fluid balance   Palliative care by specialist   Status epilepticus Patient required ICU admission and ventilator support for airway protection. Neurology was consulted. Patient received a 2000 mg IV loading dose followed by phenytoin. EEG/prolonged EEG without evidence of seizure activity. Currently on Keppra and Dilantin  Non-small cell lung cancer Metastatic brain lesions Stage 1b melanoma Not a candidate for chemotherapy/further radiation therapy. Palliative care consulted. Patient may discharge with hospice services. -Continue Decadron  Dysphagia Cortrak NG tube was used while in the ICU which is now removed. Dysphagia diet.  Acute respiratory failure with hypoxia Patient required intubation. Now extubated and resolved.  Hyponatremia Hypomagnesemia Hyponatremia resolved. Replete magnesium as needed.  AKI on  CKD stage IIIa Baseline creatinine of 1.4.  Peak creatinine of 1.89  Anemia Mild and stable.  Chronic thrombocytopenia Stable.   45 minutes spent at bedside face-to-face with patient and wife to discuss current care plan  DVT prophylaxis: Lovenox Code Status:   Code Status: DNR Family Communication: Wife and brother-in-law at bedside Disposition Plan: Discharge likely home with hospice vs home with [facility] in 1-2 days   Consultants:  PCCM  Procedures:  ETT (6/28>>6/29; 7/3>>7/4)  Antimicrobials: None    Subjective: No significant issues overnight. Family is not in agreement with transfer to [facility] place. Plan is hopefully to go home with nursing care.  Objective: Vitals:   12/11/20 0401 12/11/20 0735 12/11/20 1202 12/11/20 1556  BP: (!) 143/87 115/61 113/66 114/68  Pulse: 76 76 92 100  Resp: 17 12 15 20   Temp: (!) 97.1 F (36.2 C) (!) 97.5 F (36.4 C) 98 F (36.7 C) 98.3 F (36.8 C)  TempSrc: Axillary Axillary Axillary Axillary  SpO2: 96% 98% 97% 93%  Weight:      Height:        Intake/Output Summary (Last 24 hours) at 12/11/2020 1642 Last data filed at 12/11/2020 1500 Gross per 24 hour  Intake 1100.47 ml  Output 1100 ml  Net 0.47 ml   Filed Weights   12/04/20 1230 12/04/20 1300 12/05/20 0400  Weight: 65.8 kg 65.9 kg 71.7 kg    Examination:  General exam: Appears calm and comfortable Respiratory system: Clear to auscultation. Respiratory effort normal. Cardiovascular system: S1 & S2 heard, RRR. No murmurs, rubs, gallops or clicks. Gastrointestinal system: Abdomen is nondistended, soft and nontender. No organomegaly or masses felt. Normal bowel sounds heard. Central nervous system: Alert. Answers some questions appropriately. Follows commands. Musculoskeletal: No  edema. No calf tenderness Skin: No cyanosis. No rashes Psychiatry: Judgement and insight appear normal. Mood & affect appropriate.     Data Reviewed: I have personally reviewed following labs  and imaging studies  CBC Lab Results  Component Value Date   WBC 6.0 12/11/2020   RBC 2.79 (L) 12/11/2020   HGB 9.3 (L) 12/11/2020   HCT 27.4 (L) 12/11/2020   MCV 98.2 12/11/2020   MCH 33.3 12/11/2020   PLT 120 (L) 12/11/2020   MCHC 33.9 12/11/2020   RDW 17.3 (H) 12/11/2020   LYMPHSABS 0.7 12/04/2020   MONOABS 0.9 12/04/2020   EOSABS 0.0 12/04/2020   BASOSABS 0.0 47/82/9562     Last metabolic panel Lab Results  Component Value Date   NA 140 12/11/2020   K 5.1 12/11/2020   CL 109 12/11/2020   CO2 23 12/11/2020   BUN 29 (H) 12/11/2020   CREATININE 1.38 (H) 12/11/2020   GLUCOSE 98 12/11/2020   GFRNONAA 55 (L) 12/11/2020   GFRAA >60 03/02/2020   CALCIUM 9.3 12/11/2020   PHOS 3.1 12/04/2020   PROT 6.0 (L) 12/11/2020   ALBUMIN 3.0 (L) 12/11/2020   BILITOT 0.6 12/11/2020   ALKPHOS 86 12/11/2020   AST 21 12/11/2020   ALT 24 12/11/2020   ANIONGAP 8 12/11/2020    CBG (last 3)  No results for input(s): GLUCAP in the last 72 hours.   GFR: Estimated Creatinine Clearance: 45.9 mL/min (A) (by C-G formula based on SCr of 1.38 mg/dL (H)).  Coagulation Profile: No results for input(s): INR, PROTIME in the last 168 hours.  Recent Results (from the past 240 hour(s))  MRSA Next Gen by PCR, Nasal     Status: Abnormal   Collection Time: 12/01/20  5:06 PM   Specimen: Nasal Mucosa; Nasal Swab  Result Value Ref Range Status   MRSA by PCR Next Gen DETECTED (A) NOT DETECTED Final    Comment: RESULT CALLED TO, READ BACK BY AND VERIFIED WITH: RN DUSTINE. [phone] FCP (NOTE) The GeneXpert MRSA Assay (FDA approved for NASAL specimens only), is one component of a comprehensive MRSA colonization surveillance program. It is not intended to diagnose MRSA infection nor to guide or monitor treatment for MRSA infections. Test performance is not FDA approved in patients less than 67 years old. Performed at [facility], [place] [address], [place], [place] [address]          Radiology Studies: No results found.      Scheduled Meds:  chlorhexidine  15 mL Mouth Rinse BID   Chlorhexidine Gluconate Cloth  6 each Topical Daily   dexamethasone (DECADRON) injection  4 mg Intravenous Q8H   dextrose  50 mL Intravenous Once   enoxaparin (LOVENOX) injection  40 mg Subcutaneous Q24H   mouth rinse  15 mL Mouth Rinse q12n4p   phenytoin (DILANTIN) IV  100 mg Intravenous BID   phenytoin (DILANTIN) IV  75 mg Intravenous Q1400   sodium chloride flush  10-40 mL Intracatheter Q12H   Continuous Infusions:  famotidine (PEPCID) IV 20 mg (12/11/20 0940)   levETIRAcetam 500 mg (12/11/20 1159)     LOS: 10 days     Cordelia Poche, MD [facility] 12/11/2020, 4:42 PM  If 7PM-7AM, please contact night-coverage [email or web address]

## 2020-12-11 NOTE — Progress Notes (Signed)
[place] [id number] Manufacturing engineer [facility])  Hospital Liaison Note:   Update on this [place] Referral:  BP eligibility has been confirmed.    Unfortunately, [facility] does not have a bed to offer today but will follow up with family and TOC in the morning or sooner if room becomes available.   Please do not hesitate to call with any Hospice related questions or concerns.   Thank you for the opportunity to participate in this patient's care.   Jhonnie Garner, Therapist, sports, BSN, [facility] Liaison [phone]

## 2020-12-11 NOTE — Progress Notes (Signed)
   Palliative Medicine Inpatient Follow Up Note  Patient Profile/HPI:   70 y.o. 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 Patient was diagnosed with small cell lung cancer with mets to the brain and liver in December of 2020 and started chemotherapy in February of 2021. An MRI in February of 2022 showed worsening brain mets. He underwent salvage SRS on 2/16. He was admitted for seizures on 09/05/20. MRI in march of 2022 showed worsening brain mets again. He underwent SRS a second time on 10/05/2020. In his last visit with Dr. Marin Olp on 11/11/2020 chemotherapy was stopped due to the patient having some difficulty with the thearpy. This admission, CT head was negative for acute abnormality and MRI was negative for acute infarct. Multiple medications given for seizure activity. Patient experienced acute respiratory failure and required intubation. PMT consulted to discuss [place].   Today's Discussion (12/11/2020):  Chart reviewed. Assessed patient at the bedside. [name]'s wife Walter Hernandez is feeding him breakfast. He appears fatigued, answers questions briefly and denies pain or distress.   Walter Hernandez remains torn over her decision to proceed with [facility] referral. She states that she is realistic and understands [name] will not recover from this, however she remains distraught and feels he should pass peacefully in his own home. Therapeutic listening and emotional support was provided. One of her concerns is leaving her dog at home alone overnight. Walter Hernandez does not want to jeopardize [name]'s referral for [facility], as she acknowledges his need for this appropriate resource. I discussed the importance of prioritizing [name]'s complex needs and reassured that he will be more comfortable with the comprehensive care available at residential hospice. While awaiting a bed offer, she would like to ensure her sense of  peace and closure in the future by considering her options for a 24/7 caregiver for hospice at home if possible.   Questions and concerns addressed. Family was encouraged to call with additional needs. PMT will continue to support holistically.   Objective Assessment: Vital Signs Vitals:   12/11/20 0401 12/11/20 0735  BP: (!) 143/87 115/61  Pulse: 76 76  Resp: 17 12  Temp: (!) 97.1 F (36.2 C) (!) 97.5 F (36.4 C)  SpO2: 96% 98%    Intake/Output Summary (Last 24 hours) at 12/11/2020 1035 Last data filed at 12/11/2020 0530 Gross per 24 hour  Intake --  Output 1300 ml  Net -1300 ml    Last Weight  Most recent update: 12/05/2020  4:25 AM    Weight  71.7 kg (158 lb 1.1 oz)            Gen:  Older Caucasian M in NAD HEENT: dry mucous membranes CV: Regular rate and rhythm PULM: On RA, normal respiratory effort ABD: soft/nontender  EXT: No edema  Neuro: Disoriented  SUMMARY OF RECOMMENDATIONS   DNAR/DNI  Patients wife Walter Hernandez would like to continue all measures until day of discharge, further reassurance provided; she agrees to continue with plan for [employer] for transfer to [facility] when a bed is available  Ongoing PMT support  Time Spent: 25 minutes Greater than 50% of this time was spent in counseling and coordinating of care  Trayce Caravello Burt Knack, [facility] Palliative Medicine Team Team Phone # [phone]

## 2020-12-12 DIAGNOSIS — C3490 Malignant neoplasm of unspecified part of unspecified bronchus or lung: Secondary | ICD-10-CM | POA: Diagnosis not present

## 2020-12-12 DIAGNOSIS — R569 Unspecified convulsions: Secondary | ICD-10-CM | POA: Diagnosis not present

## 2020-12-12 DIAGNOSIS — G9341 Metabolic encephalopathy: Secondary | ICD-10-CM | POA: Diagnosis not present

## 2020-12-12 DIAGNOSIS — Z7189 Other specified counseling: Secondary | ICD-10-CM | POA: Diagnosis not present

## 2020-12-12 MED ORDER — DEXAMETHASONE 4 MG PO TABS
4.0000 mg | ORAL_TABLET | Freq: Three times a day (TID) | ORAL | Status: DC
  Administered 2020-12-12 – 2020-12-17 (×15): 4 mg via ORAL
  Filled 2020-12-12 (×16): qty 1

## 2020-12-12 MED ORDER — PHENYTOIN 125 MG/5ML PO SUSP
75.0000 mg | Freq: Every day | ORAL | Status: DC
  Administered 2020-12-12 – 2020-12-16 (×5): 75 mg via ORAL
  Filled 2020-12-12 (×5): qty 4

## 2020-12-12 MED ORDER — LEVETIRACETAM 100 MG/ML PO SOLN
500.0000 mg | Freq: Two times a day (BID) | ORAL | Status: DC
  Administered 2020-12-12 – 2020-12-17 (×11): 500 mg via ORAL
  Filled 2020-12-12 (×11): qty 5

## 2020-12-12 MED ORDER — PHENYTOIN 125 MG/5ML PO SUSP
100.0000 mg | Freq: Two times a day (BID) | ORAL | Status: DC
  Administered 2020-12-12 – 2020-12-17 (×10): 100 mg via ORAL
  Filled 2020-12-12 (×10): qty 4

## 2020-12-12 NOTE — Progress Notes (Signed)
Manufacturing engineer [facility]) Hospital Liaison note.    Chart and pt information have been reviewed by [facility] physician.  Hospice eligibility confirmed.  [facility] is unable to offer a room today. Hospital Liaison will follow up tomorrow or sooner if a room becomes available. Please do not hesitate to call with questions.    Thank you for the opportunity to participate in this patient's care.  Domenic Moras, BSN, RN [facility] Liaison (listed on [place] under Hospice/[facility])    [phone] (24h on call)

## 2020-12-12 NOTE — Progress Notes (Signed)
PROGRESS NOTE    Walter Hernandez  DIY:[id number] DOB: Aug 29, 1950 DOA: 12/01/2020 PCP: Lavone Orn, MD   Brief Narrative: Walter Hernandez is a 70 y.o. male with a history of metastatic small cell lung cancer with brain metastasis s/p chemoradiation, hypertension, melanoma. Patient presented secondary to confusion with concern for status epilepticus. He required intubation and ICU admission for treatment. Currently on Keppra and Dilantin.   Assessment & Plan:   Principal Problem:   Seizure ([place]) Active Problems:   Malignant melanoma of skin of left lower leg (HCC)   Essential hypertension   GERD (gastroesophageal reflux disease)   Extensive stage primary small cell carcinoma of lung (HCC) with known metastasis to brain   Goals of care, counseling/discussion   Acute metabolic encephalopathy with right sided weakness and acute aphasia    Benign prostatic hyperplasia without lower urinary tract symptoms   Thrombocytopenia (HCC)   Acute respiratory failure with hypoxia (HCC)   CKD (chronic kidney disease) stage 3, GFR 30-59 ml/min (HCC)   Alteration in electrolyte and fluid balance   Palliative care by specialist   Status epilepticus Patient required ICU admission and ventilator support for airway protection. Neurology was consulted. Patient received a 2000 mg IV loading dose followed by phenytoin. EEG/prolonged EEG without evidence of seizure activity. Currently on Keppra and Dilantin, switch to oral solution  Non-small cell lung cancer Metastatic brain lesions Stage 1b melanoma Not a candidate for chemotherapy/further radiation therapy. Palliative care consulted. Patient may discharge with hospice services. -Continue Decadron  Dysphagia Cortrak NG tube was used while in the ICU which is now removed. Dysphagia diet.  Acute respiratory failure with hypoxia Patient required intubation. Now extubated and resolved.  Hyponatremia Hypomagnesemia Hyponatremia resolved. Replete  magnesium as needed.  AKI on CKD stage IIIa Baseline creatinine of 1.4.  Peak creatinine of 1.89  Anemia Mild and stable.  Chronic thrombocytopenia Stable.   45 minutes spent at bedside face-to-face with patient and wife to discuss current care plan  DVT prophylaxis: Lovenox Code Status:   Code Status: DNR Family Communication: Wife at bedside Disposition Plan: Discharge likely home with hospice vs home with [facility] when able. Medically ready for discharge. Barriers include home services availability   Consultants:  PCCM  Procedures:  ETT (6/28>>6/29; 7/3>>7/4)  Antimicrobials: None    Subjective: No issues overnight.  Objective: Vitals:   12/11/20 1944 12/11/20 2311 12/12/20 0343 12/12/20 0707  BP: 95/62 117/66 100/70 106/65  Pulse: 91 81 79 74  Resp: 18 14 13 16   Temp: 98.7 F (37.1 C) 98.2 F (36.8 C) 97.6 F (36.4 C) (!) 97 F (36.1 C)  TempSrc: Axillary Axillary Axillary Axillary  SpO2: 92% 95% 90% 96%  Weight:      Height:        Intake/Output Summary (Last 24 hours) at 12/12/2020 0940 Last data filed at 12/12/2020 0600 Gross per 24 hour  Intake 923.47 ml  Output 1050 ml  Net -126.53 ml    Filed Weights   12/04/20 1230 12/04/20 1300 12/05/20 0400  Weight: 65.8 kg 65.9 kg 71.7 kg    Examination:  General exam: Appears calm and comfortable Respiratory system: Clear to auscultation. Respiratory effort normal. Cardiovascular system: S1 & S2 heard, RRR. No murmurs, rubs, gallops or clicks. Gastrointestinal system: Abdomen is nondistended, soft and nontender. No organomegaly or masses felt. Normal bowel sounds heard. Central nervous system: Alert. Musculoskeletal: No edema. No calf tenderness Skin: No cyanosis. No rashes    Data Reviewed: I have personally  reviewed following labs and imaging studies  CBC Lab Results  Component Value Date   WBC 6.0 12/11/2020   RBC 2.79 (L) 12/11/2020   HGB 9.3 (L) 12/11/2020   HCT 27.4 (L) 12/11/2020   MCV  98.2 12/11/2020   MCH 33.3 12/11/2020   PLT 120 (L) 12/11/2020   MCHC 33.9 12/11/2020   RDW 17.3 (H) 12/11/2020   LYMPHSABS 0.7 12/04/2020   MONOABS 0.9 12/04/2020   EOSABS 0.0 12/04/2020   BASOSABS 0.0 02/63/7858     Last metabolic panel Lab Results  Component Value Date   NA 140 12/11/2020   K 5.1 12/11/2020   CL 109 12/11/2020   CO2 23 12/11/2020   BUN 29 (H) 12/11/2020   CREATININE 1.38 (H) 12/11/2020   GLUCOSE 98 12/11/2020   GFRNONAA 55 (L) 12/11/2020   GFRAA >60 03/02/2020   CALCIUM 9.3 12/11/2020   PHOS 3.1 12/04/2020   PROT 6.0 (L) 12/11/2020   ALBUMIN 3.0 (L) 12/11/2020   BILITOT 0.6 12/11/2020   ALKPHOS 86 12/11/2020   AST 21 12/11/2020   ALT 24 12/11/2020   ANIONGAP 8 12/11/2020    CBG (last 3)  No results for input(s): GLUCAP in the last 72 hours.   GFR: Estimated Creatinine Clearance: 45.9 mL/min (A) (by C-G formula based on SCr of 1.38 mg/dL (H)).  Coagulation Profile: No results for input(s): INR, PROTIME in the last 168 hours.  No results found for this or any previous visit (from the past 240 hour(s)).       Radiology Studies: No results found.      Scheduled Meds:  chlorhexidine  15 mL Mouth Rinse BID   Chlorhexidine Gluconate Cloth  6 each Topical Daily   dexamethasone (DECADRON) injection  4 mg Intravenous Q8H   dextrose  50 mL Intravenous Once   enoxaparin (LOVENOX) injection  40 mg Subcutaneous Q24H   levETIRAcetam  500 mg Oral BID   mouth rinse  15 mL Mouth Rinse q12n4p   phenytoin  100 mg Oral BID   And   phenytoin  75 mg Oral Daily   sodium chloride flush  10-40 mL Intracatheter Q12H   Continuous Infusions:  famotidine (PEPCID) IV 20 mg (12/11/20 0940)     LOS: 11 days     Cordelia Poche, MD [facility] 12/12/2020, 9:40 AM  If 7PM-7AM, please contact night-coverage [email or web address]

## 2020-12-12 NOTE — TOC Progression Note (Signed)
Transition of Care [facility]) - Progression Note    Patient Details  Name: Walter Hernandez MRN: [id number] Date of Birth: February 09, 1951  Transition of Care [facility]) CM/SW Contact  Bartholomew Crews, RN Phone Number: [phone] 12/12/2020, 11:00 AM  Clinical Narrative:     Spoke with patient's spouse, Kennyth Lose, at the bedside to discuss plans to transition home. Demographics verified. Kennyth Lose stated that she wants patient to return home with [facility] hospice services, and she is in the process of setting up caregiver services with [facility]. [facility] is assisting with any DME needs. Jacking anticipates that she may be ready to bring patient home Tuesday or Wednesday but stated that "this is not written in stone." When ready to transition, patient will need PTAR for transportation. TOC following for transition needs.   Expected Discharge Plan: [place] Barriers to Discharge: Hospice Bed not available  Expected Discharge Plan and Services Expected Discharge Plan: [place] In-house Referral: Clinical Social Work, Hospice / [place] Acute Care Choice: Hospice Living arrangements for the past 2 months: Single Family Home                                       Social Determinants of Health (SDOH) Interventions    Readmission Risk Interventions Readmission Risk Prevention Plan 12/08/2020 06/04/2019  Post Dischage Appt - Not Complete  Appt Comments - pending stability/disposition  Medication Screening - Complete  Transportation Screening Complete Complete  Medication Review Press [employer]) Complete -  PCP or Specialist appointment within 3-5 days of discharge Complete -  [place] or Home Care Consult Complete -  SW Recovery Care/Counseling Consult Complete -  Palliative Care Screening Complete -  [place] Not Applicable -  Some recent data might be hidden

## 2020-12-12 NOTE — Progress Notes (Addendum)
Nutrition Consult  Received consult to discuss food preparation for home with patient's wife. Spoke to wife over the phone. We discussed that patient can have thin liquids and whatever foods he prefers; he can try regular of soft solids if he is wearing his dentures per SLP notes and "advance diet per pt's preferences." Discussed different food items and ways to make them easier to swallow, such as chopped or pureed chicken with extra gravy added, canned fruits, and soft scrambled eggs. Wife was very appreciative of information provided. Please re-consult for RD follow-up if further nutrition needs arise.   Walter Hernandez, RD, LDN, CNSC Please refer to [facility] for contact information.

## 2020-12-13 DIAGNOSIS — R569 Unspecified convulsions: Secondary | ICD-10-CM | POA: Diagnosis not present

## 2020-12-13 DIAGNOSIS — Z7189 Other specified counseling: Secondary | ICD-10-CM | POA: Diagnosis not present

## 2020-12-13 DIAGNOSIS — C3490 Malignant neoplasm of unspecified part of unspecified bronchus or lung: Secondary | ICD-10-CM | POA: Diagnosis not present

## 2020-12-13 DIAGNOSIS — G9341 Metabolic encephalopathy: Secondary | ICD-10-CM | POA: Diagnosis not present

## 2020-12-13 LAB — BASIC METABOLIC PANEL
Anion gap: 8 (ref 5–15)
BUN: 33 mg/dL — ABNORMAL HIGH (ref 8–23)
CO2: 25 mmol/L (ref 22–32)
Calcium: 9.2 mg/dL (ref 8.9–10.3)
Chloride: 108 mmol/L (ref 98–111)
Creatinine, Ser: 1.52 mg/dL — ABNORMAL HIGH (ref 0.61–1.24)
GFR, Estimated: 49 mL/min — ABNORMAL LOW (ref >60)
Glucose, Bld: 98 mg/dL (ref 70–99)
Potassium: 4.2 mmol/L (ref 3.5–5.1)
Sodium: 141 mmol/L (ref 135–145)

## 2020-12-13 LAB — CBC
HCT: 27.5 % — ABNORMAL LOW (ref 39.0–52.0)
Hemoglobin: 8.9 g/dL — ABNORMAL LOW (ref 13.0–17.0)
MCH: 32.5 pg (ref 26.0–34.0)
MCHC: 32.4 g/dL (ref 30.0–36.0)
MCV: 100.4 fL — ABNORMAL HIGH (ref 80.0–100.0)
Platelets: 126 10*3/uL — ABNORMAL LOW (ref 150–400)
RBC: 2.74 MIL/uL — ABNORMAL LOW (ref 4.22–5.81)
RDW: 17.8 % — ABNORMAL HIGH (ref 11.5–15.5)
WBC: 5.8 10*3/uL (ref 4.0–10.5)
nRBC: 0 % (ref 0.0–0.2)

## 2020-12-13 LAB — MAGNESIUM: Magnesium: 1.7 mg/dL (ref 1.7–2.4)

## 2020-12-13 MED ORDER — FAMOTIDINE 20 MG PO TABS
20.0000 mg | ORAL_TABLET | Freq: Every day | ORAL | Status: DC
  Administered 2020-12-14 – 2020-12-17 (×4): 20 mg via ORAL
  Filled 2020-12-13 (×4): qty 1

## 2020-12-13 NOTE — TOC Progression Note (Signed)
Transition of Care [facility]) - Progression Note    Patient Details  Name: Walter Hernandez MRN: [id number] Date of Birth: 28-Jun-1950  Transition of Care [facility]) CM/SW Contact  Bartholomew Crews, RN Phone Number: [phone] 12/13/2020, 3:24 PM  Clinical Narrative:     Acknowledging TOC consult for benefits check - submitted request for the following medications:  Levetiracetam 100 mg/ml solution 500 mg twice a day  Phenytoin 125 mg/5 mL suspension 100 mg twice a day    Expected Discharge Plan: [place] Barriers to Discharge: Hospice Bed not available  Expected Discharge Plan and Services Expected Discharge Plan: [place] In-house Referral: Clinical Social Work, Hospice / [place] Acute Care Choice: Hospice Living arrangements for the past 2 months: Single Family Home                                       Social Determinants of Health (SDOH) Interventions    Readmission Risk Interventions Readmission Risk Prevention Plan 12/08/2020 06/04/2019  Post Dischage Appt - Not Complete  Appt Comments - pending stability/disposition  Medication Screening - Complete  Transportation Screening Complete Complete  Medication Review ([place]) Complete -  PCP or Specialist appointment within 3-5 days of discharge Complete -  [place] or Home Care Consult Complete -  SW Recovery Care/Counseling Consult Complete -  Palliative Care Screening Complete -  [place] Not Applicable -  Some recent data might be hidden

## 2020-12-13 NOTE — Progress Notes (Signed)
PROGRESS NOTE    Walter Hernandez  INO:[id number] DOB: Sep 13, 1950 DOA: 12/01/2020 PCP: Lavone Orn, MD   Brief Narrative: Walter Hernandez is a 70 y.o. male with a history of metastatic small cell lung cancer with brain metastasis s/p chemoradiation, hypertension, melanoma. Patient presented secondary to confusion with concern for status epilepticus. He required intubation and ICU admission for treatment. Currently on Keppra and Dilantin.   Assessment & Plan:   Principal Problem:   Seizure ([place]) Active Problems:   Malignant melanoma of skin of left lower leg (HCC)   Essential hypertension   GERD (gastroesophageal reflux disease)   Extensive stage primary small cell carcinoma of lung (HCC) with known metastasis to brain   Goals of care, counseling/discussion   Acute metabolic encephalopathy with right sided weakness and acute aphasia    Benign prostatic hyperplasia without lower urinary tract symptoms   Thrombocytopenia (HCC)   Acute respiratory failure with hypoxia (HCC)   CKD (chronic kidney disease) stage 3, GFR 30-59 ml/min (HCC)   Alteration in electrolyte and fluid balance   Palliative care by specialist   Status epilepticus Patient required ICU admission and ventilator support for airway protection. Neurology was consulted. Patient received a 2000 mg IV loading dose followed by phenytoin. EEG/prolonged EEG without evidence of seizure activity. Currently on Keppra and Dilantin PO.  Non-small cell lung cancer Metastatic brain lesions Stage 1b melanoma Not a candidate for chemotherapy/further radiation therapy. Palliative care consulted. Patient may discharge with hospice services. -Continue Decadron  Dysphagia Cortrak NG tube was used while in the ICU which is now removed. Dysphagia diet.  Acute respiratory failure with hypoxia Patient required intubation. Now extubated and resolved.  Hyponatremia Hypomagnesemia Hyponatremia resolved. Replete magnesium as needed.  AKI  on CKD stage IIIa Baseline creatinine of 1.4.  Peak creatinine of 1.89  Anemia Mild and stable.  Chronic thrombocytopenia Stable.   45 minutes spent at bedside face-to-face with patient and wife to discuss current care plan  DVT prophylaxis: Lovenox Code Status:   Code Status: DNR Family Communication: Wife at bedside Disposition Plan: Discharge likely home with hospice vs home with [facility] when able. Medically ready for discharge. Barriers include home services availability   Consultants:  PCCM  Procedures:  ETT (6/28>>6/29; 7/3>>7/4)  Antimicrobials: None    Subjective: No concerns from overnight.  Objective: Vitals:   12/12/20 2018 12/12/20 2341 12/13/20 0500 12/13/20 0705  BP: 120/79 115/76 126/73 101/69  Pulse: 90 82 76 84  Resp: 18 11 17 19   Temp: 97.6 F (36.4 C) 97.6 F (36.4 C) 97.6 F (36.4 C) 97.9 F (36.6 C)  TempSrc: Axillary Axillary Axillary Axillary  SpO2: 95% 93% 96% 96%  Weight:      Height:        Intake/Output Summary (Last 24 hours) at 12/13/2020 0853 Last data filed at 12/12/2020 2020 Gross per 24 hour  Intake --  Output 550 ml  Net -550 ml    Filed Weights   12/04/20 1230 12/04/20 1300 12/05/20 0400  Weight: 65.8 kg 65.9 kg 71.7 kg    Examination:  General exam: Appears calm and comfortable Respiratory system: Clear to auscultation. Respiratory effort normal. Cardiovascular system: S1 & S2 heard, RRR. No murmurs, rubs, gallops or clicks. Gastrointestinal system: Abdomen is nondistended, soft and nontender. No organomegaly or masses felt. Normal bowel sounds heard. Central nervous system: Asleep but arouses easily. Musculoskeletal: No edema. No calf tenderness Skin: No cyanosis. No rashes    Data Reviewed: I have personally reviewed  following labs and imaging studies  CBC Lab Results  Component Value Date   WBC 5.8 12/13/2020   RBC 2.74 (L) 12/13/2020   HGB 8.9 (L) 12/13/2020   HCT 27.5 (L) 12/13/2020   MCV 100.4 (H)  12/13/2020   MCH 32.5 12/13/2020   PLT 126 (L) 12/13/2020   MCHC 32.4 12/13/2020   RDW 17.8 (H) 12/13/2020   LYMPHSABS 0.7 12/04/2020   MONOABS 0.9 12/04/2020   EOSABS 0.0 12/04/2020   BASOSABS 0.0 65/46/5035     Last metabolic panel Lab Results  Component Value Date   NA 141 12/13/2020   K 4.2 12/13/2020   CL 108 12/13/2020   CO2 25 12/13/2020   BUN 33 (H) 12/13/2020   CREATININE 1.52 (H) 12/13/2020   GLUCOSE 98 12/13/2020   GFRNONAA 49 (L) 12/13/2020   GFRAA >60 03/02/2020   CALCIUM 9.2 12/13/2020   PHOS 3.1 12/04/2020   PROT 6.0 (L) 12/11/2020   ALBUMIN 3.0 (L) 12/11/2020   BILITOT 0.6 12/11/2020   ALKPHOS 86 12/11/2020   AST 21 12/11/2020   ALT 24 12/11/2020   ANIONGAP 8 12/13/2020    CBG (last 3)  No results for input(s): GLUCAP in the last 72 hours.   GFR: Estimated Creatinine Clearance: 41.7 mL/min (A) (by C-G formula based on SCr of 1.52 mg/dL (H)).  Coagulation Profile: No results for input(s): INR, PROTIME in the last 168 hours.  No results found for this or any previous visit (from the past 240 hour(s)).       Radiology Studies: No results found.      Scheduled Meds:  chlorhexidine  15 mL Mouth Rinse BID   Chlorhexidine Gluconate Cloth  6 each Topical Daily   dexamethasone  4 mg Oral Q8H   dextrose  50 mL Intravenous Once   enoxaparin (LOVENOX) injection  40 mg Subcutaneous Q24H   levETIRAcetam  500 mg Oral BID   mouth rinse  15 mL Mouth Rinse q12n4p   phenytoin  100 mg Oral BID   And   phenytoin  75 mg Oral Daily   sodium chloride flush  10-40 mL Intracatheter Q12H   Continuous Infusions:  famotidine (PEPCID) IV 20 mg (12/12/20 1118)     LOS: 12 days     Cordelia Poche, MD [facility] 12/13/2020, 8:53 AM  If 7PM-7AM, please contact night-coverage [email or web address]

## 2020-12-14 DIAGNOSIS — C3490 Malignant neoplasm of unspecified part of unspecified bronchus or lung: Secondary | ICD-10-CM | POA: Diagnosis not present

## 2020-12-14 DIAGNOSIS — R569 Unspecified convulsions: Secondary | ICD-10-CM | POA: Diagnosis not present

## 2020-12-14 DIAGNOSIS — G9341 Metabolic encephalopathy: Secondary | ICD-10-CM | POA: Diagnosis not present

## 2020-12-14 DIAGNOSIS — Z7189 Other specified counseling: Secondary | ICD-10-CM | POA: Diagnosis not present

## 2020-12-14 DIAGNOSIS — Z515 Encounter for palliative care: Secondary | ICD-10-CM | POA: Diagnosis not present

## 2020-12-14 MED ORDER — POLYETHYLENE GLYCOL 3350 17 G PO PACK
17.0000 g | PACK | Freq: Two times a day (BID) | ORAL | Status: DC
  Administered 2020-12-14 – 2020-12-17 (×7): 17 g via ORAL
  Filled 2020-12-14 (×7): qty 1

## 2020-12-14 NOTE — Progress Notes (Signed)
PROGRESS NOTE    [name]  JME:[id number] DOB: 05/03/1951 DOA: 12/01/2020 PCP: Lavone Orn, MD   Brief Narrative: Walter Hernandez is a 70 y.o. male with a history of metastatic small cell lung cancer with brain metastasis s/p chemoradiation, hypertension, melanoma. Patient presented secondary to confusion with concern for status epilepticus. He required intubation and ICU admission for treatment. Currently on Keppra and Dilantin.   Assessment & Plan:   Principal Problem:   Seizure ([place]) Active Problems:   Malignant melanoma of skin of left lower leg (HCC)   Essential hypertension   GERD (gastroesophageal reflux disease)   Extensive stage primary small cell carcinoma of lung (HCC) with known metastasis to brain   Goals of care, counseling/discussion   Acute metabolic encephalopathy with right sided weakness and acute aphasia    Benign prostatic hyperplasia without lower urinary tract symptoms   Thrombocytopenia (HCC)   Acute respiratory failure with hypoxia (HCC)   CKD (chronic kidney disease) stage 3, GFR 30-59 ml/min (HCC)   Alteration in electrolyte and fluid balance   Palliative care by specialist   Status epilepticus Patient required ICU admission and ventilator support for airway protection. Neurology was consulted. Patient received a 2000 mg IV loading dose followed by phenytoin. EEG/prolonged EEG without evidence of seizure activity. Currently on Keppra and Dilantin PO.  Non-small cell lung cancer Metastatic brain lesions Stage 1b melanoma Not a candidate for chemotherapy/further radiation therapy. Palliative care consulted. Patient may discharge with hospice services. -Continue Decadron  Dysphagia Cortrak NG tube was used while in the ICU which is now removed. Dysphagia diet.  Acute respiratory failure with hypoxia Patient required intubation. Now extubated and resolved.  Hyponatremia Hypomagnesemia Hyponatremia resolved. Replete magnesium as needed.  AKI  on CKD stage IIIa Baseline creatinine of 1.4.  Peak creatinine of 1.89  Anemia Mild and stable.  Chronic thrombocytopenia Stable.   DVT prophylaxis: Lovenox Code Status:   Code Status: DNR Family Communication: Wife at bedside Disposition Plan: Discharge home with hospice when able. Medically ready for discharge. Barriers include home services availability   Consultants:  PCCM  Procedures:  ETT (6/28>>6/29; 7/3>>7/4)  Antimicrobials: None    Subjective: No issues overnight.  Objective: Vitals:   12/13/20 1939 12/13/20 2353 12/14/20 0323 12/14/20 0325  BP: 104/61 118/65  96/73  Pulse: 83 87  81  Resp: 15 20  18   Temp: 98.4 F (36.9 C) 98.3 F (36.8 C) 98.4 F (36.9 C)   TempSrc: Oral Oral Oral   SpO2: 94% 96%  96%  Weight:      Height:        Intake/Output Summary (Last 24 hours) at 12/14/2020 0954 Last data filed at 12/14/2020 0300 Gross per 24 hour  Intake 630 ml  Output 700 ml  Net -70 ml    Filed Weights   12/04/20 1230 12/04/20 1300 12/05/20 0400  Weight: 65.8 kg 65.9 kg 71.7 kg    Examination:  General exam: Appears calm and comfortable Respiratory system: Clear to auscultation. Respiratory effort normal. Cardiovascular system: S1 & S2 heard, RRR. No murmurs, rubs, gallops or clicks. Gastrointestinal system: Abdomen is nondistended, soft and nontender. No organomegaly or masses felt. Normal bowel sounds heard. Central nervous system: Alert. Musculoskeletal: No edema. No calf tenderness Skin: No cyanosis. No rashes Psychiatry: Mood & affect appropriate.     Data Reviewed: I have personally reviewed following labs and imaging studies  CBC Lab Results  Component Value Date   WBC 5.8 12/13/2020   RBC  2.74 (L) 12/13/2020   HGB 8.9 (L) 12/13/2020   HCT 27.5 (L) 12/13/2020   MCV 100.4 (H) 12/13/2020   MCH 32.5 12/13/2020   PLT 126 (L) 12/13/2020   MCHC 32.4 12/13/2020   RDW 17.8 (H) 12/13/2020   LYMPHSABS 0.7 12/04/2020   MONOABS 0.9  12/04/2020   EOSABS 0.0 12/04/2020   BASOSABS 0.0 29/47/6546     Last metabolic panel Lab Results  Component Value Date   NA 141 12/13/2020   K 4.2 12/13/2020   CL 108 12/13/2020   CO2 25 12/13/2020   BUN 33 (H) 12/13/2020   CREATININE 1.52 (H) 12/13/2020   GLUCOSE 98 12/13/2020   GFRNONAA 49 (L) 12/13/2020   GFRAA >60 03/02/2020   CALCIUM 9.2 12/13/2020   PHOS 3.1 12/04/2020   PROT 6.0 (L) 12/11/2020   ALBUMIN 3.0 (L) 12/11/2020   BILITOT 0.6 12/11/2020   ALKPHOS 86 12/11/2020   AST 21 12/11/2020   ALT 24 12/11/2020   ANIONGAP 8 12/13/2020    CBG (last 3)  No results for input(s): GLUCAP in the last 72 hours.   GFR: Estimated Creatinine Clearance: 41.7 mL/min (A) (by C-G formula based on SCr of 1.52 mg/dL (H)).  Coagulation Profile: No results for input(s): INR, PROTIME in the last 168 hours.  No results found for this or any previous visit (from the past 240 hour(s)).       Radiology Studies: No results found.      Scheduled Meds:  chlorhexidine  15 mL Mouth Rinse BID   Chlorhexidine Gluconate Cloth  6 each Topical Daily   dexamethasone  4 mg Oral Q8H   dextrose  50 mL Intravenous Once   enoxaparin (LOVENOX) injection  40 mg Subcutaneous Q24H   famotidine  20 mg Oral Daily   levETIRAcetam  500 mg Oral BID   mouth rinse  15 mL Mouth Rinse q12n4p   phenytoin  100 mg Oral BID   And   phenytoin  75 mg Oral Daily   polyethylene glycol  17 g Oral BID   sodium chloride flush  10-40 mL Intracatheter Q12H   Continuous Infusions:     LOS: 13 days     Cordelia Poche, MD [facility] 12/14/2020, 9:54 AM  If 7PM-7AM, please contact night-coverage [email or web address]

## 2020-12-14 NOTE — Progress Notes (Signed)
Palliative Medicine Inpatient Follow Up Note  Patient Profile/HPI:   70 y.o. 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 Patient was diagnosed with small cell lung cancer with mets to the brain and liver in December of 2020 and started chemotherapy in February of 2021. An MRI in February of 2022 showed worsening brain mets. He underwent salvage SRS on 2/16. He was admitted for seizures on 09/05/20. MRI in march of 2022 showed worsening brain mets again. He underwent SRS a second time on 10/05/2020. In his last visit with Dr. Marin Olp on 11/11/2020 chemotherapy was stopped due to the patient having some difficulty with the thearpy. This admission, CT head was negative for acute abnormality and MRI was negative for acute infarct. Multiple medications given for seizure activity. Patient experienced acute respiratory failure and required intubation. PMT consulted to discuss [place].   Today's Discussion (12/14/2020):  Chart reviewed. Assessed patient at the bedside. He is somnolent, breathing comfortably on room air. His wife Colletta [place] is at the bedside.   Created space and opportunity for Jacki's thoughts and feelings on patient's current illness and the plan for discharge moving forward. She confirms that she is now against residential hospice, preferring to take [place] home for end of life care with the support of [facility] hospice and a daily caregiver through [facility]. She has learned of this agency through a family reference and is in the process of final contract review. A caregiver will be available to assist her 8 hours per day.  Colletta Maryland remains very overwhelmed with the details involved in arranging for discharge. We again reviewed hospice resources/services that are available at home in detail. She endorses feeling "jumbled" and often feeling confused by terminology, as this is all still very new and  alarming to her. Emotional support and therapeutic listening was provided as Colletta Maryland shares her thought process and priorities for [name]'s care. Colletta Maryland feels that she does have the capability to provide care at home and meet [name]'s needs. She speaks at length of the great anxiety she feels, however, when considering that counseling from the care team has largely pointed her to [facility] as a more appropriate plan. An in-depth discussion was had covering a typical day at home and concepts such as medical supplies, foley catheter care, cleaning, feeding, medication administration, and who to contact in case of unmet needs. We discussed Jacki's thoughts on her financial navigation of the various expenses associated with a caregiver 7 days per week. Discussed her concerns of family disagreement with her decision-making. Ultimately, she chooses to proceed with plans for hospice at home and accepts that she will have potentially stressful duties as both a caregiver and a loving family member.  Questions and concerns addressed. Family was encouraged to call with additional needs. PMT will continue to support holistically.   Objective Assessment: Vital Signs Vitals:   12/14/20 0323 12/14/20 0325  BP:  96/73  Pulse:  81  Resp:  18  Temp: 98.4 F (36.9 C)   SpO2:  96%    Intake/Output Summary (Last 24 hours) at 12/14/2020 1021 Last data filed at 12/14/2020 0300 Gross per 24 hour  Intake 630 ml  Output 700 ml  Net -70 ml    Last Weight  Most recent update: 12/05/2020  4:25 AM    Weight  71.7 kg (158 lb 1.1 oz)  Gen:  Older Caucasian M in NAD HEENT: dry mucous membranes CV: Regular rate and rhythm PULM: On RA, normal respiratory effort ABD: soft/nontender  EXT: No edema  Neuro: Disoriented, somnolent  SUMMARY OF RECOMMENDATIONS   -Wife has now decided for home with hospice; discussed with [facility] hospital liaison -Continue current interventions until delivery of all DME and patient  is ready for discharge -Psychosocial and emotional support provided -Ongoing PMT support  Time Spent: 65 minutes Greater than 50% of this time was spent in counseling and coordinating of care  Dorthy Cooler, [facility] Palliative Medicine Team Team Phone # [phone]

## 2020-12-14 NOTE — TOC Benefit Eligibility Note (Signed)
Transition of Care [facility]) Benefit Eligibility Note    Patient Details  Name: Walter Hernandez MRN: [id number] Date of Birth: [birth year]   Medication/Dose: LEVETIRACETAM 100 MG /ML SOLUTION 500 MG BID   [employer] M/O  CO-PAY- ZERO DOLLARS  Covered?: Yes  Tier: 2 Drug  Prescription Coverage Preferred Pharmacy: [facility]  Spoke with Person/Company/Phone Number:: SHARLOTTE  @  [employer] RX #  [phone]  Co-Pay: $4.00  Prior Approval: No  Deductible:  (INITIAL COVERAGE)  Additional Notes: PHENYTOIN 125  / 5ML SUSPENSION 100 MG BID : COVER- YES   CO-PAY- $4.00  TIER- 2 DRUG P/A-NO   HUMAN M/O  CO-PAY :ZERO Keitha [place] Phone Number: 12/14/2020, 3:33 PM

## 2020-12-14 NOTE — Progress Notes (Signed)
[facility] Liaison Note;  Spoke to wife Kennyth Lose this morning to listen, support and answer questions about [facility] home with hospice services.   Per Kennyth Lose, the plan is to discharge patient home with hospice once home health caregivers have been set up in the home - possibly Wednesday or Thursday. Family has requested the following DME to be delivered to the home address in patient chart. [facility] will order a hospital bed and bedside tray for patient. And family was given [facility] contact information and encouraged to call for further support or questions.   [facility] will continue to follow patient during this hospitalization and support discharge as needed.  Please call [facility] with any hospice related concerns,  Thank you,  Gar Ponto, RN [facility] HLT [phone]

## 2020-12-15 DIAGNOSIS — R569 Unspecified convulsions: Secondary | ICD-10-CM | POA: Diagnosis not present

## 2020-12-15 DIAGNOSIS — Z7189 Other specified counseling: Secondary | ICD-10-CM | POA: Diagnosis not present

## 2020-12-15 DIAGNOSIS — G9341 Metabolic encephalopathy: Secondary | ICD-10-CM | POA: Diagnosis not present

## 2020-12-15 DIAGNOSIS — C3490 Malignant neoplasm of unspecified part of unspecified bronchus or lung: Secondary | ICD-10-CM | POA: Diagnosis not present

## 2020-12-15 LAB — CBC
HCT: 28.2 % — ABNORMAL LOW (ref 39.0–52.0)
Hemoglobin: 9.5 g/dL — ABNORMAL LOW (ref 13.0–17.0)
MCH: 33.1 pg (ref 26.0–34.0)
MCHC: 33.7 g/dL (ref 30.0–36.0)
MCV: 98.3 fL (ref 80.0–100.0)
Platelets: 147 10*3/uL — ABNORMAL LOW (ref 150–400)
RBC: 2.87 MIL/uL — ABNORMAL LOW (ref 4.22–5.81)
RDW: 17.6 % — ABNORMAL HIGH (ref 11.5–15.5)
WBC: 7.2 10*3/uL (ref 4.0–10.5)
nRBC: 0 % (ref 0.0–0.2)

## 2020-12-15 LAB — BASIC METABOLIC PANEL
Anion gap: 6 (ref 5–15)
BUN: 28 mg/dL — ABNORMAL HIGH (ref 8–23)
CO2: 26 mmol/L (ref 22–32)
Calcium: 9.4 mg/dL (ref 8.9–10.3)
Chloride: 108 mmol/L (ref 98–111)
Creatinine, Ser: 1.42 mg/dL — ABNORMAL HIGH (ref 0.61–1.24)
GFR, Estimated: 53 mL/min — ABNORMAL LOW (ref >60)
Glucose, Bld: 102 mg/dL — ABNORMAL HIGH (ref 70–99)
Potassium: 4.2 mmol/L (ref 3.5–5.1)
Sodium: 140 mmol/L (ref 135–145)

## 2020-12-15 LAB — MAGNESIUM: Magnesium: 1.7 mg/dL (ref 1.7–2.4)

## 2020-12-15 NOTE — Progress Notes (Signed)
[facility] Liaison Note;   Spoke to [place], the plan is to discharge patient home with hospice once home health caregivers have been set up in the home - possibly Thursday. Family has requested the following DME to be delivered to the home address in patient chart. [facility] will order a hospital bed and bedside tray for patient. And family was given [facility] contact information and encouraged to call for further support or questions.  Wife would like education on foley catheter before discharge along with any supplies to help get her by until [facility] RN can provide.    [facility] will continue to follow patient during this hospitalization and support discharge as needed.   Please call [facility] with any hospice related concerns,   Thank you, Clementeen Hoof, BSN, [facility] [phone]

## 2020-12-15 NOTE — Progress Notes (Signed)
PROGRESS NOTE    Walter Hernandez  HKV:[id number] DOB: February 21, 1951 DOA: 12/01/2020 PCP: Lavone Orn, MD   Brief Narrative: Walter Hernandez is a 70 y.o. male with a history of metastatic small cell lung cancer with brain metastasis s/p chemoradiation, hypertension, melanoma. Patient presented secondary to confusion with concern for status epilepticus. He required intubation and ICU admission for treatment. Currently on Keppra and Dilantin.   Assessment & Plan:   Principal Problem:   Seizure ([place]) Active Problems:   Malignant melanoma of skin of left lower leg (HCC)   Essential hypertension   GERD (gastroesophageal reflux disease)   Extensive stage primary small cell carcinoma of lung (HCC) with known metastasis to brain   Goals of care, counseling/discussion   Acute metabolic encephalopathy with right sided weakness and acute aphasia    Benign prostatic hyperplasia without lower urinary tract symptoms   Thrombocytopenia (HCC)   Acute respiratory failure with hypoxia (HCC)   CKD (chronic kidney disease) stage 3, GFR 30-59 ml/min (HCC)   Alteration in electrolyte and fluid balance   Palliative care by specialist   Status epilepticus Patient required ICU admission and ventilator support for airway protection. Neurology was consulted. Patient received a 2000 mg IV loading dose followed by phenytoin. EEG/prolonged EEG without evidence of seizure activity. Currently on Keppra and Dilantin PO.  Non-small cell lung cancer Metastatic brain lesions Stage 1b melanoma Not a candidate for chemotherapy/further radiation therapy. Palliative care consulted. Patient may discharge with hospice services. -Continue Decadron  Dysphagia Cortrak NG tube was used while in the ICU which is now removed. Dysphagia diet.  Acute respiratory failure with hypoxia Patient required intubation. Now extubated and resolved.  Hyponatremia Hypomagnesemia Hyponatremia resolved. Replete magnesium as needed.  AKI  on CKD stage IIIa Baseline creatinine of 1.4.  Peak creatinine of 1.89  Anemia Mild and stable.  Chronic thrombocytopenia Stable.   DVT prophylaxis: Lovenox Code Status:   Code Status: DNR Family Communication: Wife at bedside Disposition Plan: Discharge home with hospice when able; likely Thursday, 7/14 when equipment and services available at home. Medically ready for discharge.   Consultants:  PCCM  Procedures:  ETT (6/28>>6/29; 7/3>>7/4)  Antimicrobials: None    Subjective: No issues overnight.  Objective: Vitals:   12/15/20 0300 12/15/20 0810 12/15/20 1100 12/15/20 1152  BP: 113/65 110/80    Pulse: 75 83    Resp: (!) 22 20    Temp: 97.8 F (36.6 C) 98 F (36.7 C) (!) 97.4 F (36.3 C) (!) 97.4 F (36.3 C)  TempSrc: Axillary Oral Oral Oral  SpO2: 95% 99%    Weight:      Height:        Intake/Output Summary (Last 24 hours) at 12/15/2020 1410 Last data filed at 12/15/2020 1100 Gross per 24 hour  Intake 490 ml  Output 1050 ml  Net -560 ml    Filed Weights   12/04/20 1230 12/04/20 1300 12/05/20 0400  Weight: 65.8 kg 65.9 kg 71.7 kg    Examination:  General exam: Appears calm and comfortable Respiratory system: Clear to auscultation. Respiratory effort normal. Cardiovascular system: S1 & S2 heard, RRR. No murmurs, rubs, gallops or clicks. Gastrointestinal system: Abdomen is nondistended, soft and nontender. No organomegaly or masses felt. Normal bowel sounds heard. Central nervous system: Alert. Musculoskeletal: No edema. No calf tenderness Skin: No cyanosis. No rashes   Data Reviewed: I have personally reviewed following labs and imaging studies  CBC Lab Results  Component Value Date   WBC 7.2  12/15/2020   RBC 2.87 (L) 12/15/2020   HGB 9.5 (L) 12/15/2020   HCT 28.2 (L) 12/15/2020   MCV 98.3 12/15/2020   MCH 33.1 12/15/2020   PLT 147 (L) 12/15/2020   MCHC 33.7 12/15/2020   RDW 17.6 (H) 12/15/2020   LYMPHSABS 0.7 12/04/2020   MONOABS 0.9  12/04/2020   EOSABS 0.0 12/04/2020   BASOSABS 0.0 63/06/6008     Last metabolic panel Lab Results  Component Value Date   NA 140 12/15/2020   K 4.2 12/15/2020   CL 108 12/15/2020   CO2 26 12/15/2020   BUN 28 (H) 12/15/2020   CREATININE 1.42 (H) 12/15/2020   GLUCOSE 102 (H) 12/15/2020   GFRNONAA 53 (L) 12/15/2020   GFRAA >60 03/02/2020   CALCIUM 9.4 12/15/2020   PHOS 3.1 12/04/2020   PROT 6.0 (L) 12/11/2020   ALBUMIN 3.0 (L) 12/11/2020   BILITOT 0.6 12/11/2020   ALKPHOS 86 12/11/2020   AST 21 12/11/2020   ALT 24 12/11/2020   ANIONGAP 6 12/15/2020    CBG (last 3)  No results for input(s): GLUCAP in the last 72 hours.   GFR: Estimated Creatinine Clearance: 44.6 mL/min (A) (by C-G formula based on SCr of 1.42 mg/dL (H)).  Coagulation Profile: No results for input(s): INR, PROTIME in the last 168 hours.  No results found for this or any previous visit (from the past 240 hour(s)).       Radiology Studies: No results found.      Scheduled Meds:  chlorhexidine  15 mL Mouth Rinse BID   Chlorhexidine Gluconate Cloth  6 each Topical Daily   dexamethasone  4 mg Oral Q8H   dextrose  50 mL Intravenous Once   enoxaparin (LOVENOX) injection  40 mg Subcutaneous Q24H   famotidine  20 mg Oral Daily   levETIRAcetam  500 mg Oral BID   mouth rinse  15 mL Mouth Rinse q12n4p   phenytoin  100 mg Oral BID   And   phenytoin  75 mg Oral Daily   polyethylene glycol  17 g Oral BID   sodium chloride flush  10-40 mL Intracatheter Q12H   Continuous Infusions:     LOS: 14 days     Cordelia Poche, MD [facility] 12/15/2020, 2:10 PM  If 7PM-7AM, please contact night-coverage [email or web address]

## 2020-12-15 NOTE — Progress Notes (Signed)
Now it looks like Walter Hernandez will be going home with hospice.  Hopefully, he will be going home in a day or so.  Para he really cannot move his left arm.  He appears to be swallowing better.  It is hard to say how much he is really eating.  He does not appear to be in any pain.  His labs show white cell count is 7.2.  Hemoglobin 9.5.  Platelet count 147,000.  His BUN is 28 creatinine 1.42.  Calcium is 9.4.  Sodium is 140.  I just feel bad for him.  I think he clearly is suffering from the effects of all the radiation that he has had to the brain.  Radiation, unfortunately, was necessary because of recurrent brain mets.  This is very common with respect to small cell lung cancer.  I just think that he has lived alive long enough to start having the effects of encephalomalacia.  Again he appears comfortable.  All of his vital signs are stable.  Temperature 97.8.  Pulse 75.  Blood pressure 113/65.  On exam, his lungs sound relatively clear.  Cardiac exam regular rate and rhythm.  Extremities shows no movement in the left arm.  He has some movement in his legs bilaterally and in the right arm.  Abdomen is soft.  Bowel sounds are decreased.  I just hate that Mr. Komar is living like this.  Hopefully, this will not going all that much longer.  He really has no quality of life.  I know that his wife will do a great job trying to help him at home.  I know Hospice will also help out.  We will certainly pray hard for him.  I know all my nurses in the office this him.  He is always so kind and generous.  I know that he has had fantastic care from all the staff up on 4 NP  Lattie Haw, MD  Psalm 105:4

## 2020-12-15 NOTE — Care Management Important Message (Signed)
Important Message  Patient Details  Name: Walter Hernandez MRN: [id number] Date of Birth: 1951-02-24   Medicare Important Message Given:  Yes     Marjory Meints Montine Circle 12/15/2020, 2:59 PM

## 2020-12-16 DIAGNOSIS — E878 Other disorders of electrolyte and fluid balance, not elsewhere classified: Secondary | ICD-10-CM

## 2020-12-16 DIAGNOSIS — K2100 Gastro-esophageal reflux disease with esophagitis, without bleeding: Secondary | ICD-10-CM

## 2020-12-16 MED ORDER — FAMOTIDINE 20 MG PO TABS: 20.0000 mg | ORAL_TABLET | Freq: Every day | ORAL | 0 refills | Status: AC

## 2020-12-16 MED ORDER — ALPRAZOLAM 0.5 MG PO TABS: 0.5000 mg | ORAL_TABLET | Freq: Four times a day (QID) | ORAL | 0 refills | Status: AC | PRN

## 2020-12-16 MED ORDER — POLYETHYLENE GLYCOL 3350 17 G PO PACK: 17.0000 g | PACK | Freq: Two times a day (BID) | ORAL | 0 refills | Status: AC

## 2020-12-16 MED ORDER — DEXAMETHASONE 4 MG PO TABS: 4.0000 mg | ORAL_TABLET | Freq: Three times a day (TID) | ORAL | 0 refills | Status: AC

## 2020-12-16 MED ORDER — PHENYTOIN 125 MG/5ML PO SUSP: 100.0000 mg | Freq: Two times a day (BID) | ORAL | 12 refills | Status: AC

## 2020-12-16 MED ORDER — PHENYTOIN 125 MG/5ML PO SUSP: 75.0000 mg | Freq: Every day | ORAL | 12 refills | Status: AC

## 2020-12-16 MED ORDER — TRAMADOL HCL 50 MG PO TABS: 50.0000 mg | ORAL_TABLET | Freq: Four times a day (QID) | ORAL | 0 refills | Status: AC | PRN

## 2020-12-16 MED ORDER — ACETAMINOPHEN 325 MG PO TABS: 650.0000 mg | ORAL_TABLET | Freq: Four times a day (QID) | ORAL | 0 refills | Status: AC | PRN

## 2020-12-16 NOTE — Progress Notes (Addendum)
[employer]ufacturing [employer] [facility]) Hospital Liaison: RN note     Notified by Transition of Care Manger of patient/family request for [facility] services at home after discharge. Chart and patient information under review by [facility] physician. Hospice eligibility pending currently.     Writer spoke met at bedside with pt and pt's wife [place]. Discussed the stressors and the concerns about taking pt home, as well as the challenging of coordinating plan for discharge.  Colletta Maryland agrees with plan for [facility] admission assessment visit to be set for 630p-7p tomorrow (originally set for 6p but pushed back).  Joined during visit by [facility] Kristi who agress with plan and will start preparing for discharge in the morning with PTAR to be scheduled for approximately 9am.  Jacki verbalizes agreement.  [facility] contact numbers given.  DME has been delivered at this time. This liaison spoke with pt's wife to confirm.  Please send signed and completed DNR form home with patient/family.   Please provide prescriptions for mediations at discharge to ensure comfort until patient can be admitted onto hospice services.   Please do not hesitate to call with questions.   Thank you for the opportunity to participate in this patient's care.  Domenic Moras, BSN, RN       [place] (listed on AMION under [id number]  [phone] (24h on call)

## 2020-12-16 NOTE — Discharge Summary (Signed)
Physician Discharge Summary  Zamere Pasternak GMW:[id number] DOB: [birth year] DOA: 12/01/2020  PCP: Lavone Orn, MD  Admit date: 12/01/2020 Discharge date: 12/16/2020  Admitted From: Home Disposition: Home with Hospice  Recommendations for Outpatient Follow-up:  Follow up care per Hospice Protocol   Home Health: No Equipment/Devices: DME Hospital Bed  Discharge Condition: Stable  CODE STATUS: DO NOT RESUSCITATE  Diet recommendation: Dysphagia 1 Diet   Brief/Interim Summary: The patient is a 70 year old Caucasian male with a past medical history significant for but not limited to metastatic small cell lung cancer with brain metastasis status post chemoradiation, history of hypertension, melanoma as well as other comorbidities who presented secondary to confusion with concern for status epilepticus.  Subsequently he required intubation and ICU admission for treatment and has been placed on Keppra and Dilantin.  Initially he required 2000 mg of IV loading dose of antiepileptics.  He was placed on ventilator support for airway protection.  EEG and prolonged EEG was without seizure activity and currently on Keppra and Dilantin p.o.  After further goals of care discussion patient's wife is decided for home with hospice and hospice consult has been placed.  He has been deemed medically stable for discharge home with Hospice  Discharge Diagnoses:  Principal Problem:   Seizure [facility]) Active Problems:   Malignant melanoma of skin of left lower leg (HCC)   Essential hypertension   GERD (gastroesophageal reflux disease)   Extensive stage primary small cell carcinoma of lung ([place]) with known metastasis to brain   Goals of care, counseling/discussion   Acute metabolic encephalopathy with right sided weakness and acute aphasia    Benign prostatic hyperplasia without lower urinary tract symptoms   Thrombocytopenia (HCC)   Acute respiratory failure with hypoxia (HCC)   CKD (chronic kidney disease) stage 3,  GFR 30-59 ml/min (HCC)   Alteration in electrolyte and fluid balance   Palliative care by specialist    Status epilepticus -Patient required ICU admission and ventilator support for airway protection.  -Neurology was consulted.  -Patient received a 2000 mg IV loading dose followed by phenytoin. EEG/prolonged EEG without evidence of seizure activity.  -Currently on Keppra and Dilantin PO. -After further goals of care discussion the patient's wife is decided to take the patient home with home hospice services   Non-small cell lung cancer Metastatic brain lesions Stage 1b melanoma -Not a candidate for chemotherapy/further radiation therapy. Palliative care consulted. Patient will be discharged home with home hospice services -Continue Decadron at discharge   Dysphagia -Cortrak NG tube was used while in the ICU which is now removed.  -Now on a dysphagia diet   Acute respiratory failure with hypoxia -Patient required intubation. Now extubated and resolved. -Currently on room air -SpO2: 93 % O2 Flow Rate (L/min): 3 L/min FiO2 (%): 30 % -Goals of care have been shifted to comfort and palliative and patient will be going home with hospice   Hyponatremia Hypomagnesemia Hyponatremia resolved. Replete magnesium as needed and last mag was 1.7   AKI on CKD stage IIIa -Baseline creatinine of 1.4.  Peak creatinine of 1.89 -Back to His Baseline as BUN/Cr is now 28/1.42 -Avoid nephrotoxic medications, contrast dyes, hypotension and renally adjust medications next-repeat CMP in the outpatient setting if necessary   Normocytic Anemia -Mild and stable. -Patient's hemoglobin/hematocrit is now improved to 9.5/28.2 and his MCV is 90.3--continue to monitor for signs and symptoms of bleeding; currently no overt bleeding noted -Repeat CBC in outpatient setting if necessary   Chronic Thrombocytopenia -Improving.  Patient platelet count is now 147,000 -Continue monitor for signs and symptoms of  bleeding; currently no overt bleeding  -Repeat CBC in outpatient setting if necessary  Discharge Instructions  Allergies as of 12/16/2020       Reactions   Other Itching, Other (See Comments)   Seasonal allergies: Sinus headaches, sneezing, itchy eyes        Medication List     STOP taking these medications    diphenoxylate-atropine 2.5-0.025 MG tablet Commonly known as: LOMOTIL   furosemide 20 MG tablet Commonly known as: LASIX       TAKE these medications    acetaminophen 325 MG tablet Commonly known as: TYLENOL Place 2 tablets (650 mg total) into feeding tube every 6 (six) hours as needed for mild pain, headache or fever. What changed:  medication strength how much to take how to take this reasons to take this   ALPRAZolam 0.5 MG tablet Commonly known as: XANAX Take 1 tablet (0.5 mg total) by mouth every 6 (six) hours as needed for anxiety.   dexamethasone 4 MG tablet Commonly known as: DECADRON Take 1 tablet (4 mg total) by mouth every 8 (eight) hours. What changed:  how much to take how to take this when to take this additional instructions Another medication with the same name was removed. Continue taking this medication, and follow the directions you see here.   dronabinol 5 MG capsule Commonly known as: MARINOL Take 1 capsule (5 mg total) by mouth 2 (two) times daily before a meal. What changed:  when to take this reasons to take this   famotidine 20 MG tablet Commonly known as: PEPCID Take 1 tablet (20 mg total) by mouth daily. Start taking on: December 17, 2020   finasteride 5 MG tablet Commonly known as: PROSCAR TAKE 1 TABLET BY MOUTH EVERY DAY   levETIRAcetam 500 MG tablet Commonly known as: KEPPRA Take 1 tablet (500 mg total) by mouth 2 (two) times daily.   metroNIDAZOLE 500 MG tablet Commonly known as: FLAGYL Take 1 tablet (500 mg total) by mouth 3 (three) times daily.   multivitamin capsule Take 1 capsule by mouth daily.    omeprazole 40 MG capsule Commonly known as: PRILOSEC Take 1 capsule (40 mg total) by mouth 2 (two) times daily. Please call [phone] to schedule an office visit for more refills. What changed: additional instructions   ondansetron 4 MG tablet Commonly known as: ZOFRAN Take 1 tablet (4 mg total) by mouth every 6 (six) hours as needed for nausea.   OPCON-A OP Place 2 drops into both eyes 3 (three) times daily as needed (for itching).   phenytoin 125 MG/5ML suspension Commonly known as: DILANTIN Take 4 mLs (100 mg total) by mouth 2 (two) times daily.   phenytoin 125 MG/5ML suspension Commonly known as: DILANTIN Take 3 mLs (75 mg total) by mouth daily.   polyethylene glycol 17 g packet Commonly known as: MIRALAX / GLYCOLAX Take 17 g by mouth 2 (two) times daily.   tamsulosin 0.4 MG Caps capsule Commonly known as: FLOMAX Take 1 capsule (0.4 mg total) by mouth daily. What changed: when to take this   traMADol 50 MG tablet Commonly known as: ULTRAM Take 1 tablet (50 mg total) by mouth every 6 (six) hours as needed. What changed: reasons to take this        Allergies  Allergen Reactions   Other Itching and Other (See Comments)    Seasonal allergies: Sinus headaches, sneezing, itchy eyes  Consultations: PCCM Transfer Neurology Palliative Care Medicine  Procedures/Studies: CT HEAD WO CONTRAST  Result Date: 12/06/2020 CLINICAL DATA:  Encephalopathy seizure. History of brain metastases. EXAM: CT HEAD WITHOUT CONTRAST TECHNIQUE: Contiguous axial images were obtained from the base of the skull through the vertex without intravenous contrast. COMPARISON:  12/01/2020 brain MRI. FINDINGS: Brain: There is no mass, hemorrhage or extra-axial collection. There is generalized atrophy without lobar predilection. Hypodensity of the white matter is most commonly associated with chronic microvascular disease. Vascular: No abnormal hyperdensity of the major intracranial arteries or  dural venous sinuses. No intracranial atherosclerosis. Skull: The visualized skull base, calvarium and extracranial soft tissues are normal. Sinuses/Orbits: No fluid levels or advanced mucosal thickening of the visualized paranasal sinuses. No mastoid or middle ear effusion. The orbits are normal. IMPRESSION: 1. No acute intracranial abnormality. 2. Generalized atrophy and chronic microvascular ischemia. Electronically Signed   By: Ulyses Jarred M.D.   On: 12/06/2020 01:21   MR BRAIN W WO CONTRAST  Result Date: 12/01/2020 CLINICAL DATA:  Stroke versus seizure presentation patient being treated for metastatic lung cancer. EXAM: MRI HEAD WITHOUT AND WITH CONTRAST TECHNIQUE: Multiplanar, multiecho pulse sequences of the brain and surrounding structures were obtained without and with intravenous contrast. CONTRAST:  2mL GADAVIST GADOBUTROL 1 MMOL/ML IV SOLN COMPARISON:  CT studies earlier same day. MRI 09/21/2020. multiple prior MRI studies as distant as 06/03/2019. FINDINGS: Brain: Today's study is considerably degraded by motion. There is recurrent edema seen within the right mid brain and medial thalamus, which had resolved on the study of April 18. There is some linear enhancement in the right mid brain. No enhancement in the medial thalamus. These findings could represent some combination of treatment effect and possibly recurrent disease in the mid brain. There is enlargement of an enhancing focus within the left temporal lobe just inferior and lateral to the temporal horn of the lateral ventricle. This was present as a punctate focus of enhancement on the study of 2 months ago but today measures 6 x 7 mm. This is most likely a metastatic focus. No other enhancing foci are evident. Multiple previously seen metastatic foci no longer show mass effect or enhancement. Strikingly, enhancement at the left posterior frontal vertex along the cortical surfaces and within the subinsular white matter on the right have  completely resolved. Widespread T2 and FLAIR signal throughout the white matter is largely similar to the previous study otherwise. No evidence of hemorrhage or hydrocephalus. No restricted diffusion to suggest acute or subacute infarction. Vascular: Major vessels at the base of the brain show flow. Skull and upper cervical spine: Negative Sinuses/Orbits: Clear/normal Other: None IMPRESSION: Diffusion imaging does not show any acute or subacute infarction. Resolution of abnormal enhancement seen on the study of 2 months ago within the subinsular white matter on the right and along the cortical surface of the brain at the left posterior frontal vertex. Enlargement of what was a punctate focus of enhancement in the left temporal lobe, now measuring 6 x 7 mm today, favored to represent a metastasis. Recurrent linear enhancement in the mid brain on the right. Recurrent edema in the right mid brain and medial thalamus. No enhancement in the medial thalamus. These areas had normalized on the study 09/21/2020, were normal on the study of 04/09/2020 but showed striking enhancement on the study of 07/10/2020. Therefore, I wonder if these areas represent waxing and waning treatment effect. Electronically Signed   By: Nelson Chimes M.D.   On: 12/01/2020 14:40  Portable Chest x-ray  Result Date: 12/06/2020 CLINICAL DATA:  Encounter for endotracheal tube placement. EXAM: PORTABLE CHEST 1 VIEW COMPARISON:  Chest x-ray 12/06/2020 2 a.m. FINDINGS: Interval placement endotracheal tube with tip terminating 4.5 cm above the carina. Right chest wall accessed Port-A-Cath and enteric tube in similar position. The heart size and mediastinal contours are unchanged. Aortic arch calcification. Left base atelectasis. No focal consolidation. No pulmonary edema. No pleural effusion. No pneumothorax. No acute osseous abnormality. IMPRESSION: Endotracheal tube in appropriate position. Other lines and tubes stable in position. Electronically  Signed   By: Iven Finn M.D.   On: 12/06/2020 03:51   DG CHEST PORT 1 VIEW  Result Date: 12/06/2020 CLINICAL DATA:  Respiratory abnormalities EXAM: PORTABLE CHEST 1 VIEW COMPARISON:  12/01/2020 FINDINGS: Since the previous study, the endotracheal tube is been removed. An enteric tube has been placed. Tip is off the field of view but well below the hemidiaphragms. Power port type central venous catheter is unchanged in position. Shallow inspiration. Heart size and pulmonary vascularity are normal. Lungs are clear. IMPRESSION: Appliances appear in satisfactory location. No evidence of active pulmonary disease. Electronically Signed   By: Lucienne Capers M.D.   On: 12/06/2020 02:40   DG CHEST PORT 1 VIEW  Result Date: 12/01/2020 CLINICAL DATA:  Endotracheal tube placement. EXAM: PORTABLE CHEST 1 VIEW COMPARISON:  November 09, 2020. FINDINGS: The heart size and mediastinal contours are within normal limits. Both lungs are clear. Endotracheal tube is in grossly good position. Right internal jugular Port-A-Cath is unchanged. The visualized skeletal structures are unremarkable. IMPRESSION: Endotracheal tube in grossly good position. Electronically Signed   By: Marijo Conception M.D.   On: 12/01/2020 18:07   DG Abd Portable 1V  Result Date: 12/02/2020 CLINICAL DATA:  Check feeding catheter placement EXAM: PORTABLE ABDOMEN - 1 VIEW COMPARISON:  None. FINDINGS: Feeding catheter is noted in the distal stomach directed towards the pylorus. Scattered large and small bowel gas is noted. No other focal abnormality is seen. IMPRESSION: Feeding catheter in the distal stomach. Electronically Signed   By: Inez Catalina M.D.   On: 12/02/2020 13:31   EEG adult  Result Date: 12/01/2020 Lora Havens, MD     12/01/2020 12:13 PM Patient Name: Jawanza Zambito MRN: [id number] Epilepsy Attending: Lora Havens Referring Physician/Provider: Anibal Henderson, NP Date: 12/01/2020 Duration: 22.02 mins Patient history: 70 year old male  presented with acute onset aphasia and left-sided weakness.  EEG to evaluate for seizures. Level of alertness: Awake AEDs during EEG study: Keppra, Ativan Technical aspects: This EEG study was done with scalp electrodes positioned according to the 10-20 International system of electrode placement. Electrical activity was acquired at a sampling rate of 500Hz  and reviewed with a high frequency filter of 70Hz  and a low frequency filter of 1Hz . EEG data were recorded continuously and digitally stored. Description: No posterior dominant rhythm was seen.  EEG showed continuous low amplitude 2 to 3 Hz delta slowing in left hemisphere as well as 5 to 6 Hz theta slowing in right hemisphere.  Hyperventilation and photic stimulation were not performed.   ABNORMALITY -Lateralized rhythmic delta activity, left hemisphere -Continuous slow, right hemisphere IMPRESSION: This study showed lateralized rhythmic delta activity in the left hemisphere which can be seen due to underlying structural abnormality, postictal state.  Of note, lateralized rhythmic delta activity is also on the ictal-interictal continuum with low potential for seizures.  Additionally EEG suggestive of moderate diffuse encephalopathy, nonspecific etiology but most likely due to  medications, postictal state.  No definite seizures were seen during this time. Dr. Rory Percy was notified. Priyanka Barbra Sarks   Overnight EEG with video  Result Date: 12/02/2020 Lora Havens, MD     12/03/2020  9:21 AM Patient Name: Talib Headley MRN: [id number] Epilepsy Attending: Lora Havens Referring Physician/Provider: Dr Amie Portland Duration: 12/01/2020 1214 to 12/02/2020 1403  Patient history: 70 year old male presented with acute onset aphasia and left-sided weakness.  EEG to evaluate for seizures.  Level of alertness: Awake, asleep  AEDs during EEG study: Keppra, PHT  Technical aspects: This EEG study was done with scalp electrodes positioned according to the 10-20  International system of electrode placement. Electrical activity was acquired at a sampling rate of 500Hz  and reviewed with a high frequency filter of 70Hz  and a low frequency filter of 1Hz . EEG data were recorded continuously and digitally stored.  Description: No posterior dominant rhythm was seen.  Sleep was characterized by sleep spindles (12 to 14 Hz), maximal frontocentral region.  EEG showed continuous generalized 5 to 6 Hz theta slowing. Intermittent 2 to 3 Hz rhythmic delta slowing was also noted in left hemisphere which at times appeared sharply contoured. Hyperventilation and photic stimulation were not performed.    ABNORMALITY -Lateralized rhythmic delta activity, left hemisphere -Continuous slow, generalized  IMPRESSION: This study showed lateralized rhythmic delta activity in the left hemisphere which can be seen due to underlying structural abnormality, postictal state.  Of note, lateralized rhythmic delta activity is also on the ictal-interictal continuum with low potential for seizures. Additionally EEG suggestive of moderate diffuse encephalopathy, nonspecific etiology but most likely due to medications, postictal state.  No definite seizures were seen during this time.  Lora Havens   CT HEAD CODE STROKE WO CONTRAST  Result Date: 12/01/2020 CLINICAL DATA:  Code stroke. Acute neuro deficit, stroke suspected. History of metastatic melanoma. EXAM: CT HEAD WITHOUT CONTRAST TECHNIQUE: Contiguous axial images were obtained from the base of the skull through the vertex without intravenous contrast. COMPARISON:  Head CT 11/09/2020 and MRI 09/21/2020 FINDINGS: Brain: There is no evidence of an acute large territory infarct, intracranial hemorrhage, extra-axial fluid collection or midline shift/other significant mass effect. Brain metastases demonstrated on the prior MRI are largely occult by CT. Focal cortical hypodensity/encephalomalacia in the high posterior right frontal lobe is unchanged and  corresponds to the location of a previously treated lesion. Confluent hypodensities throughout the cerebral white matter bilaterally are unchanged and compatible with post radiation changes superimposed on chronic small vessel ischemia. There is moderate cerebral atrophy. Vascular: No fracture or suspicious osseous crash that calcified atherosclerosis at the skull base. No hyperdense vessel. Skull: No fracture or suspicious osseous lesion. Sinuses/Orbits: Mild mucosal thickening in the ethmoid sinuses. Clear mastoid air cells. Unremarkable orbits. Other: None. ASPECTS [facility] Stroke Program Early CT Score) - Ganglionic level infarction (caudate, lentiform nuclei, internal capsule, insula, M1-M3 cortex): 7 - Supraganglionic infarction (M4-M6 cortex): 3 Total score (0-10 with 10 being normal): 10 IMPRESSION: 1. No evidence of acute intracranial abnormality. 2. ASPECTS is 10. 3. Post radiation changes in the cerebral white matter. These results were communicated to Dr. Rory Percy at 11:07 am on 12/01/2020 by text page via the [facility] messaging system. Electronically Signed   By: Logan Bores M.D.   On: 12/01/2020 11:07   CT ANGIO HEAD NECK W WO CM (CODE STROKE)  Result Date: 12/01/2020 CLINICAL DATA:  Neurological deficit.  Acute stroke suspected. EXAM: CT ANGIOGRAPHY HEAD AND NECK TECHNIQUE: Multidetector CT  imaging of the head and neck was performed using the standard protocol during bolus administration of intravenous contrast. Multiplanar CT image reconstructions and MIPs were obtained to evaluate the vascular anatomy. Carotid stenosis measurements (when applicable) are obtained utilizing NASCET criteria, using the distal internal carotid diameter as the denominator. CONTRAST:  72mL OMNIPAQUE IOHEXOL 350 MG/ML SOLN COMPARISON:  Head CT earlier same day. FINDINGS: CTA NECK FINDINGS Aortic arch: Aortic atherosclerosis. Branching pattern is normal without origin stenosis. Right carotid system: Common carotid artery  widely patent to the bifurcation. Calcified plaque at the carotid bifurcation and ICA bulb but no stenosis compared to the more distal cervical ICA diameter. Left carotid system: Common carotid artery widely patent to the bifurcation. Calcified plaque at the carotid bifurcation and ICA bulb. No stenosis compared to the diameter of the more distal cervical ICA. Vertebral arteries: Left vertebral artery origin is normal. Right vertebral artery origin shows some adjacent calcification but no stenosis. Both vertebral arteries widely patent through the cervical region to the foramen magnum. Skeleton: Ordinary cervical spondylosis. Other neck: No mass or lymphadenopathy. Upper chest: Mild emphysema and pulmonary scarring. Review of the MIP images confirms the above findings CTA HEAD FINDINGS Anterior circulation: Both internal carotid arteries patent through the skull base and siphon regions. Minimal atherosclerotic calcification in the carotid siphon regions but no stenosis. The anterior and middle cerebral vessels are patent. No acute large vessel occlusion or correctable proximal stenosis. Posterior circulation: Both vertebral arteries widely patent through the foramen magnum to the basilar. No basilar stenosis. Posterior circulation branch vessels appear normal. Venous sinuses: Patent and normal. Anatomic variants: None significant. Review of the MIP images confirms the above findings IMPRESSION: No acute intracranial large or medium vessel occlusion. No correctable proximal stenosis. Atherosclerotic calcification at both carotid bifurcations but without stenosis. These results were communicated to Dr. Rory Percy at [place] 6/28/2022by text page via the [facility] messaging system. Electronically Signed   By: Nelson Chimes M.D.   On: 12/01/2020 11:11    Subjective: Seen and examined at bedside and wife is giving the patient some coffee.  He was doing okay and resting.  Wife was frustrated with the hospice process and wished  for hospice to be there when he was discharged home.  Equipment being delivered today.  Patient will be discharged home tomorrow.  No other concerns or complaints at this time.   Discharge Exam: Vitals:   12/16/20 0734 12/16/20 1202  BP: 108/70 (!) 109/57  Pulse: 96 (!) 106  Resp: 15 18  Temp: 97.6 F (36.4 C) 97.6 F (36.4 C)  SpO2: 93% 93%   Vitals:   12/15/20 2300 12/16/20 0341 12/16/20 0734 12/16/20 1202  BP: (!) 118/58 98/64 108/70 (!) 109/57  Pulse: 92 84 96 (!) 106  Resp: 18 19 15 18   Temp: 97.7 F (36.5 C) 97.6 F (36.4 C) 97.6 F (36.4 C) 97.6 F (36.4 C)  TempSrc: Oral Axillary Axillary Axillary  SpO2: 97% 97% 93% 93%  Weight:      Height:       General: Pt is alert, awake, not in acute distress Cardiovascular: Tachycardic rate but regular rhythm, S1/S2 +, no rubs, no gallops Respiratory: Mildly diminished bilaterally, no wheezing, no rhonchi; unlabored breathing Abdominal: Soft, NT, ND, bowel sounds + Extremities: No appreciable edema, no cyanosis  The results of significant diagnostics from this hospitalization (including imaging, microbiology, ancillary and laboratory) are listed below for reference.    Microbiology: No results found for this or any  previous visit (from the past 240 hour(s)).   Labs: BNP (last 3 results) No results for input(s): BNP in the last 8760 hours. Basic Metabolic Panel: Recent Labs  Lab 12/11/20 0442 12/13/20 0250 12/15/20 0500  NA 140 141 140  K 5.1 4.2 4.2  CL 109 108 108  CO2 23 25 26   GLUCOSE 98 98 102*  BUN 29* 33* 28*  CREATININE 1.38* 1.52* 1.42*  CALCIUM 9.3 9.2 9.4  MG 1.4* 1.7 1.7   Liver Function Tests: Recent Labs  Lab 12/11/20 0442  AST 21  ALT 24  ALKPHOS 86  BILITOT 0.6  PROT 6.0*  ALBUMIN 3.0*   No results for input(s): LIPASE, AMYLASE in the last 168 hours. No results for input(s): AMMONIA in the last 168 hours. CBC: Recent Labs  Lab 12/11/20 0442 12/13/20 0250 12/15/20 0500  WBC 6.0  5.8 7.2  HGB 9.3* 8.9* 9.5*  HCT 27.4* 27.5* 28.2*  MCV 98.2 100.4* 98.3  PLT 120* 126* 147*   Cardiac Enzymes: No results for input(s): CKTOTAL, CKMB, CKMBINDEX, TROPONINI in the last 168 hours. BNP: Invalid input(s): POCBNP CBG: No results for input(s): GLUCAP in the last 168 hours. D-Dimer No results for input(s): DDIMER in the last 72 hours. Hgb A1c No results for input(s): HGBA1C in the last 72 hours. Lipid Profile No results for input(s): CHOL, HDL, LDLCALC, TRIG, CHOLHDL, LDLDIRECT in the last 72 hours. Thyroid function studies No results for input(s): TSH, T4TOTAL, T3FREE, THYROIDAB in the last 72 hours.  Invalid input(s): FREET3 Anemia work up No results for input(s): VITAMINB12, FOLATE, FERRITIN, TIBC, IRON, RETICCTPCT in the last 72 hours. Urinalysis    Component Value Date/Time   COLORURINE YELLOW 11/09/2020 1900   APPEARANCEUR CLEAR 11/09/2020 1900   LABSPEC 1.010 11/09/2020 1900   PHURINE 5.0 11/09/2020 1900   GLUCOSEU NEGATIVE 11/09/2020 1900   HGBUR NEGATIVE 11/09/2020 1900   BILIRUBINUR NEGATIVE 11/09/2020 1900   KETONESUR NEGATIVE 11/09/2020 1900   PROTEINUR NEGATIVE 11/09/2020 1900   NITRITE NEGATIVE 11/09/2020 1900   LEUKOCYTESUR NEGATIVE 11/09/2020 1900   Sepsis Labs Invalid input(s): PROCALCITONIN,  WBC,  LACTICIDVEN Microbiology No results found for this or any previous visit (from the past 240 hour(s)).  Time coordinating discharge: 35 minutes  SIGNED:  Kerney Elbe, DO [facility] 12/16/2020, 1:05 PM Pager is on [place]  If 7PM-7AM, please contact night-coverage [email or web address]

## 2020-12-16 NOTE — TOC Progression Note (Addendum)
Transition of Care (TOC) - Progression Note  Marvetta Gibbons RN,BSN Transitions of Care Unit 4NP (non trauma) - RN Case Manager See Treatment Team for direct Phone #    Patient Details  Name: Walter Hernandez MRN: [id number] Date of Birth: March 06, 1951  Transition of Care [facility]) CM/SW Contact  Dahlia Client Romeo Rabon, RN Phone Number: 12/16/2020, 2:07 PM  Clinical Narrative:    In to speak with wife regarding transition plan for home with Hospice, Chrislyn with [facility] also present at the bedside. Per Chrislyn DME is scheduled to be delivered today per [employer] between 2-4pm this afternoon. Wife states she is prepared to leave hospital and be home for the delivery. Chrislyn also confirmed that they will be able to have a Hospice admission nurse be at the home tomorrow evening to be able to admit pt into Hospice services tomorrow on discharge. Wife is agreeable to this plan. Wife also states she has private duty assistance set up with [employer] for 8hr/day.   Discussed with wife transportation home- she is requesting that transportation be set up as early as possible, explained to wife that once transport is called we do not have any control as to how long it takes them to come and pick patient up- however the earlier we call to schedule and place pt on list hopefully the better chance we can get patient home sooner rather than later- wife voiced understanding of this and states she would like to be here in the morning and has agreed about a time to call transport for around 9am.   Address confirmed with wife in epic.   MD has been updated, and will made sure GOLD DNR signed as well as have scripts ready for d/c in the AM, TOC to call [facility] in the am around 9 am to schedule pickup.    Expected Discharge Plan: Hospice Medical Facility Barriers to Discharge: Other (must enter comment) (awaiting hospice DME to be delivered and private duty arrangements per wife)  Expected Discharge Plan and  Services Expected Discharge Plan: [place] In-house Referral: Clinical Social Work, Hospice / [place] Acute Care Choice: Hospice Living arrangements for the past 2 months: Single Family Home                 DME Arranged: Hospice Equipment Package Others DME Agency: [facility]     Representative spoke with at DME Agency: per [place]: Hospice and [place] Date [place]: 12/08/20       Social Determinants of Health (SDOH) Interventions    Readmission Risk Interventions Readmission Risk Prevention Plan 12/08/2020 06/04/2019  Post Dischage Appt - Not Complete  Appt Comments - pending stability/disposition  Medication Screening - Complete  Transportation Screening Complete Complete  Medication Review Press photographer) Complete -  PCP or Specialist appointment within 3-5 days of discharge Complete -  [place] or Home Care Consult Complete -  SW Recovery Care/Counseling Consult Complete -  Palliative Care Screening Complete -  [place] Not Applicable -  Some recent data might be hidden

## 2020-12-17 ENCOUNTER — Other Ambulatory Visit: Payer: Self-pay | Admitting: Internal Medicine

## 2020-12-17 MED ORDER — HEPARIN SOD (PORK) LOCK FLUSH 100 UNIT/ML IV SOLN
500.0000 [IU] | INTRAVENOUS | Status: AC | PRN
  Administered 2020-12-17: 500 [IU]
  Filled 2020-12-17: qty 5

## 2020-12-17 MED ORDER — LEVETIRACETAM 100 MG/ML PO SOLN: 500.0000 mg | Freq: Two times a day (BID) | ORAL | 0 refills | Status: AC

## 2020-12-17 NOTE — TOC Transition Note (Signed)
Transition of Care (TOC) - CM/SW Discharge Note Marvetta Gibbons RN,BSN Transitions of Care Unit 4NP (non trauma) - RN Case Manager See Treatment Team for direct Phone #    Patient Details  Name: Walter Hernandez MRN: [id number] Date of Birth: 05/29/1951  Transition of Care [facility]) CM/SW Contact:  Dawayne Patricia, RN Phone Number: 12/17/2020, 11:52 AM   Clinical Narrative:    Discharge order written, pt stable to transition home w/ hospice today. Have spoken with wife Kennyth Lose this am and confirmed DME has been delivered to the home- she is prepared to take pt home as discussed yesterday.  [facility] has been called and transport scheduled for pt to transition home, Hospice will see pt later today to admit pt under Hospice services with [facility]. Explained to wife that as discussed yesterday we have know way of knowing when transport will arrive to pick pt up to transport home, however we will be hopeful that it will be sooner rather than later since we have called early to place him on the list.   Paperwork and GOLD DNR on shadow chart for transport. Bedside RN updated that Corey Harold has been called.    Final next level of care: Home w Hospice Care Barriers to Discharge: Barriers Resolved   Patient Goals and CMS Choice Patient states their goals for this hospitalization and ongoing recovery are:: Comfort CMS Medicare.gov Compare Post Acute Care list provided to:: Patient Represenative (must comment) Choice offered to / list presented to : Spouse  Discharge Placement                 Home w/ Hospice       Discharge Plan and Services In-house Referral: Clinical Social Work, Hospice / Palliative Care   Post Acute Care Choice: Hospice          DME Arranged: Hospice Equipment Package Others DME Agency: [employer]     Representative spoke with at DME Agency: per [place]: Hospice and [place] Date [facility] Agency Contacted: 12/08/20      Social  Determinants of Health (SDOH) Interventions     Readmission Risk Interventions Readmission Risk Prevention Plan 12/08/2020 06/04/2019  Post Dischage Appt - Not Complete  Appt Comments - pending stability/disposition  Medication Screening - Complete  Transportation Screening Complete Complete  Medication Review Press photographer) Complete -  PCP or Specialist appointment within 3-5 days of discharge Complete -  [place] or Home Care Consult Complete -  SW Recovery Care/Counseling Consult Complete -  Palliative Care Screening Complete -  [place] Not Applicable -  Some recent data might be hidden

## 2020-12-17 NOTE — Discharge Summary (Signed)
Physician Discharge Summary  Walter Hernandez KDX:[id number] DOB: 06-09-1950 DOA: 12/01/2020  PCP: Lavone Orn, MD  Admit date: 12/01/2020 Discharge date: 12/17/2020  Admitted From: Home Disposition: Home with Hospice  Recommendations for Outpatient Follow-up:  Follow up care per Hospice Protocol   Home Health: No Equipment/Devices: DME Hospital Bed  Discharge Condition: Stable  CODE STATUS: DO NOT RESUSCITATE  Diet recommendation: Dysphagia 1 Diet   Brief/Interim Summary: The patient is a 70 year old Caucasian male with a past medical history significant for but not limited to metastatic small cell lung cancer with brain metastasis status post chemoradiation, history of hypertension, melanoma as well as other comorbidities who presented secondary to confusion with concern for status epilepticus.  Subsequently he required intubation and ICU admission for treatment and has been placed on Keppra and Dilantin.  Initially he required 2000 mg of IV loading dose of antiepileptics.  He was placed on ventilator support for airway protection.  EEG and prolonged EEG was without seizure activity and currently on Keppra and Dilantin p.o.  After further goals of care discussion patient's wife is decided for home with hospice and hospice consult has been placed.  He has been deemed medically stable for discharge home with Hospice  ADDENDUM 12/17/20: He was deemed medically stable to be discharged however remained overnight as Transportation is to be set up today for 9:00 AM. No acute events overnight and stable to be Discharged still Home with Hospice as the Hospice team is coming out this evening   Discharge Diagnoses:  Principal Problem:   Seizure ([place]) Active Problems:   Malignant melanoma of skin of left lower leg (HCC)   Essential hypertension   GERD (gastroesophageal reflux disease)   Extensive stage primary small cell carcinoma of lung ([place]) with known metastasis to brain   Goals of care,  counseling/discussion   Acute metabolic encephalopathy with right sided weakness and acute aphasia    Benign prostatic hyperplasia without lower urinary tract symptoms   Thrombocytopenia (HCC)   Acute respiratory failure with hypoxia (HCC)   CKD (chronic kidney disease) stage 3, GFR 30-59 ml/min (HCC)   Alteration in electrolyte and fluid balance   Palliative care by specialist    Status epilepticus -Patient required ICU admission and ventilator support for airway protection.  -Neurology was consulted.  -Patient received a 2000 mg IV loading dose followed by phenytoin. EEG/prolonged EEG without evidence of seizure activity.  -Currently on Keppra and Dilantin PO. -After further goals of care discussion the patient's wife is decided to take the patient home with home hospice services   Non-small cell lung cancer Metastatic brain lesions Stage 1b melanoma -Not a candidate for chemotherapy/further radiation therapy. Palliative care consulted. Patient will be discharged home with home hospice services -Continue Decadron at discharge   Dysphagia -Cortrak NG tube was used while in the ICU which is now removed.  -Now on a dysphagia diet   Acute respiratory failure with hypoxia -Patient required intubation. Now extubated and resolved. -Currently on room air -SpO2: 96 % O2 Flow Rate (L/min): 3 L/min FiO2 (%): 30 % -Goals of care have been shifted to comfort and palliative and patient will be going home with hospice   Hyponatremia Hypomagnesemia Hyponatremia resolved. Replete magnesium as needed and last mag was 1.7   AKI on CKD stage IIIa -Baseline creatinine of 1.4.  Peak creatinine of 1.89 -Back to His Baseline as BUN/Cr is now 28/1.42 -Avoid nephrotoxic medications, contrast dyes, hypotension and renally adjust medications next-repeat CMP in the outpatient  setting if necessary   Normocytic Anemia -Mild and stable. -Patient's hemoglobin/hematocrit is now improved to 9.5/28.2 and  his MCV is 90.3--continue to monitor for signs and symptoms of bleeding; currently no overt bleeding noted -Repeat CBC in outpatient setting if necessary   Chronic Thrombocytopenia -Improving.  Patient platelet count is now 147,000 -Continue monitor for signs and symptoms of bleeding; currently no overt bleeding  -Repeat CBC in outpatient setting if necessary  Discharge Instructions  Allergies as of 12/17/2020       Reactions   Other Itching, Other (See Comments)   Seasonal allergies: Sinus headaches, sneezing, itchy eyes        Medication List     STOP taking these medications    diphenoxylate-atropine 2.5-0.025 MG tablet Commonly known as: LOMOTIL   furosemide 20 MG tablet Commonly known as: LASIX       TAKE these medications    acetaminophen 325 MG tablet Commonly known as: TYLENOL Place 2 tablets (650 mg total) into feeding tube every 6 (six) hours as needed for mild pain, headache or fever. What changed:  medication strength how much to take how to take this reasons to take this   ALPRAZolam 0.5 MG tablet Commonly known as: XANAX Take 1 tablet (0.5 mg total) by mouth every 6 (six) hours as needed for anxiety.   dexamethasone 4 MG tablet Commonly known as: DECADRON Take 1 tablet (4 mg total) by mouth every 8 (eight) hours. What changed:  how much to take how to take this when to take this additional instructions Another medication with the same name was removed. Continue taking this medication, and follow the directions you see here.   dronabinol 5 MG capsule Commonly known as: MARINOL Take 1 capsule (5 mg total) by mouth 2 (two) times daily before a meal. What changed:  when to take this reasons to take this   famotidine 20 MG tablet Commonly known as: PEPCID Take 1 tablet (20 mg total) by mouth daily.   finasteride 5 MG tablet Commonly known as: PROSCAR TAKE 1 TABLET BY MOUTH EVERY DAY   levETIRAcetam 500 MG tablet Commonly known as:  KEPPRA Take 1 tablet (500 mg total) by mouth 2 (two) times daily.   metroNIDAZOLE 500 MG tablet Commonly known as: FLAGYL Take 1 tablet (500 mg total) by mouth 3 (three) times daily.   multivitamin capsule Take 1 capsule by mouth daily.   omeprazole 40 MG capsule Commonly known as: PRILOSEC Take 1 capsule (40 mg total) by mouth 2 (two) times daily. Please call [phone] to schedule an office visit for more refills. What changed: additional instructions   ondansetron 4 MG tablet Commonly known as: ZOFRAN Take 1 tablet (4 mg total) by mouth every 6 (six) hours as needed for nausea.   OPCON-A OP Place 2 drops into both eyes 3 (three) times daily as needed (for itching).   phenytoin 125 MG/5ML suspension Commonly known as: DILANTIN Take 4 mLs (100 mg total) by mouth 2 (two) times daily.   phenytoin 125 MG/5ML suspension Commonly known as: DILANTIN Take 3 mLs (75 mg total) by mouth daily.   polyethylene glycol 17 g packet Commonly known as: MIRALAX / GLYCOLAX Take 17 g by mouth 2 (two) times daily.   tamsulosin 0.4 MG Caps capsule Commonly known as: FLOMAX Take 1 capsule (0.4 mg total) by mouth daily. What changed: when to take this   traMADol 50 MG tablet Commonly known as: ULTRAM Take 1 tablet (50 mg total) by  mouth every 6 (six) hours as needed. What changed: reasons to take this        Allergies  Allergen Reactions   Other Itching and Other (See Comments)    Seasonal allergies: Sinus headaches, sneezing, itchy eyes   Consultations: PCCM Transfer Neurology Palliative Care Medicine  Procedures/Studies: CT HEAD WO CONTRAST  Result Date: 12/06/2020 CLINICAL DATA:  Encephalopathy seizure. History of brain metastases. EXAM: CT HEAD WITHOUT CONTRAST TECHNIQUE: Contiguous axial images were obtained from the base of the skull through the vertex without intravenous contrast. COMPARISON:  12/01/2020 brain MRI. FINDINGS: Brain: There is no mass, hemorrhage or  extra-axial collection. There is generalized atrophy without lobar predilection. Hypodensity of the white matter is most commonly associated with chronic microvascular disease. Vascular: No abnormal hyperdensity of the major intracranial arteries or dural venous sinuses. No intracranial atherosclerosis. Skull: The visualized skull base, calvarium and extracranial soft tissues are normal. Sinuses/Orbits: No fluid levels or advanced mucosal thickening of the visualized paranasal sinuses. No mastoid or middle ear effusion. The orbits are normal. IMPRESSION: 1. No acute intracranial abnormality. 2. Generalized atrophy and chronic microvascular ischemia. Electronically Signed   By: Ulyses Jarred M.D.   On: 12/06/2020 01:21   MR BRAIN W WO CONTRAST  Result Date: 12/01/2020 CLINICAL DATA:  Stroke versus seizure presentation patient being treated for metastatic lung cancer. EXAM: MRI HEAD WITHOUT AND WITH CONTRAST TECHNIQUE: Multiplanar, multiecho pulse sequences of the brain and surrounding structures were obtained without and with intravenous contrast. CONTRAST:  64mL GADAVIST GADOBUTROL 1 MMOL/ML IV SOLN COMPARISON:  CT studies earlier same day. MRI 09/21/2020. multiple prior MRI studies as distant as 06/03/2019. FINDINGS: Brain: Today's study is considerably degraded by motion. There is recurrent edema seen within the right mid brain and medial thalamus, which had resolved on the study of April 18. There is some linear enhancement in the right mid brain. No enhancement in the medial thalamus. These findings could represent some combination of treatment effect and possibly recurrent disease in the mid brain. There is enlargement of an enhancing focus within the left temporal lobe just inferior and lateral to the temporal horn of the lateral ventricle. This was present as a punctate focus of enhancement on the study of 2 months ago but today measures 6 x 7 mm. This is most likely a metastatic focus. No other enhancing  foci are evident. Multiple previously seen metastatic foci no longer show mass effect or enhancement. Strikingly, enhancement at the left posterior frontal vertex along the cortical surfaces and within the subinsular white matter on the right have completely resolved. Widespread T2 and FLAIR signal throughout the white matter is largely similar to the previous study otherwise. No evidence of hemorrhage or hydrocephalus. No restricted diffusion to suggest acute or subacute infarction. Vascular: Major vessels at the base of the brain show flow. Skull and upper cervical spine: Negative Sinuses/Orbits: Clear/normal Other: None IMPRESSION: Diffusion imaging does not show any acute or subacute infarction. Resolution of abnormal enhancement seen on the study of 2 months ago within the subinsular white matter on the right and along the cortical surface of the brain at the left posterior frontal vertex. Enlargement of what was a punctate focus of enhancement in the left temporal lobe, now measuring 6 x 7 mm today, favored to represent a metastasis. Recurrent linear enhancement in the mid brain on the right. Recurrent edema in the right mid brain and medial thalamus. No enhancement in the medial thalamus. These areas had normalized on the  study 09/21/2020, were normal on the study of 04/09/2020 but showed striking enhancement on the study of 07/10/2020. Therefore, I wonder if these areas represent waxing and waning treatment effect. Electronically Signed   By: Nelson Chimes M.D.   On: 12/01/2020 14:40   Portable Chest x-ray  Result Date: 12/06/2020 CLINICAL DATA:  Encounter for endotracheal tube placement. EXAM: PORTABLE CHEST 1 VIEW COMPARISON:  Chest x-ray 12/06/2020 2 a.m. FINDINGS: Interval placement endotracheal tube with tip terminating 4.5 cm above the carina. Right chest wall accessed Port-A-Cath and enteric tube in similar position. The heart size and mediastinal contours are unchanged. Aortic arch calcification.  Left base atelectasis. No focal consolidation. No pulmonary edema. No pleural effusion. No pneumothorax. No acute osseous abnormality. IMPRESSION: Endotracheal tube in appropriate position. Other lines and tubes stable in position. Electronically Signed   By: Iven Finn M.D.   On: 12/06/2020 03:51   DG CHEST PORT 1 VIEW  Result Date: 12/06/2020 CLINICAL DATA:  Respiratory abnormalities EXAM: PORTABLE CHEST 1 VIEW COMPARISON:  12/01/2020 FINDINGS: Since the previous study, the endotracheal tube is been removed. An enteric tube has been placed. Tip is off the field of view but well below the hemidiaphragms. Power port type central venous catheter is unchanged in position. Shallow inspiration. Heart size and pulmonary vascularity are normal. Lungs are clear. IMPRESSION: Appliances appear in satisfactory location. No evidence of active pulmonary disease. Electronically Signed   By: Lucienne Capers M.D.   On: 12/06/2020 02:40   DG CHEST PORT 1 VIEW  Result Date: 12/01/2020 CLINICAL DATA:  Endotracheal tube placement. EXAM: PORTABLE CHEST 1 VIEW COMPARISON:  November 09, 2020. FINDINGS: The heart size and mediastinal contours are within normal limits. Both lungs are clear. Endotracheal tube is in grossly good position. Right internal jugular Port-A-Cath is unchanged. The visualized skeletal structures are unremarkable. IMPRESSION: Endotracheal tube in grossly good position. Electronically Signed   By: Marijo Conception M.D.   On: 12/01/2020 18:07   DG Abd Portable 1V  Result Date: 12/02/2020 CLINICAL DATA:  Check feeding catheter placement EXAM: PORTABLE ABDOMEN - 1 VIEW COMPARISON:  None. FINDINGS: Feeding catheter is noted in the distal stomach directed towards the pylorus. Scattered large and small bowel gas is noted. No other focal abnormality is seen. IMPRESSION: Feeding catheter in the distal stomach. Electronically Signed   By: Inez Catalina M.D.   On: 12/02/2020 13:31   EEG adult  Result Date:  12/01/2020 Walter Havens, MD     12/01/2020 12:13 PM Patient Name: Benjamine Strout MRN: [id number] Epilepsy Attending: Lora Hernandez Referring Physician/Provider: Anibal Henderson, NP Date: 12/01/2020 Duration: 22.02 mins Patient history: 70 year old male presented with acute onset aphasia and left-sided weakness.  EEG to evaluate for seizures. Level of alertness: Awake AEDs during EEG study: Keppra, Ativan Technical aspects: This EEG study was done with scalp electrodes positioned according to the 10-20 International system of electrode placement. Electrical activity was acquired at a sampling rate of 500Hz  and reviewed with a high frequency filter of 70Hz  and a low frequency filter of 1Hz . EEG data were recorded continuously and digitally stored. Description: No posterior dominant rhythm was seen.  EEG showed continuous low amplitude 2 to 3 Hz delta slowing in left hemisphere as well as 5 to 6 Hz theta slowing in right hemisphere.  Hyperventilation and photic stimulation were not performed.   ABNORMALITY -Lateralized rhythmic delta activity, left hemisphere -Continuous slow, right hemisphere IMPRESSION: This study showed lateralized rhythmic delta activity in the  left hemisphere which can be seen due to underlying structural abnormality, postictal state.  Of note, lateralized rhythmic delta activity is also on the ictal-interictal continuum with low potential for seizures.  Additionally EEG suggestive of moderate diffuse encephalopathy, nonspecific etiology but most likely due to medications, postictal state.  No definite seizures were seen during this time. Dr. Rory Percy was notified. Priyanka Barbra Sarks   Overnight EEG with video  Result Date: 12/02/2020 Walter Havens, MD     12/03/2020  9:21 AM Patient Name: Walter Hernandez MRN: [id number] Epilepsy Attending: Lora Hernandez Referring Physician/Provider: Dr Amie Portland Duration: 12/01/2020 1214 to 12/02/2020 1403  Patient history: 70 year old male presented with acute  onset aphasia and left-sided weakness.  EEG to evaluate for seizures.  Level of alertness: Awake, asleep  AEDs during EEG study: Keppra, PHT  Technical aspects: This EEG study was done with scalp electrodes positioned according to the 10-20 International system of electrode placement. Electrical activity was acquired at a sampling rate of 500Hz  and reviewed with a high frequency filter of 70Hz  and a low frequency filter of 1Hz . EEG data were recorded continuously and digitally stored.  Description: No posterior dominant rhythm was seen.  Sleep was characterized by sleep spindles (12 to 14 Hz), maximal frontocentral region.  EEG showed continuous generalized 5 to 6 Hz theta slowing. Intermittent 2 to 3 Hz rhythmic delta slowing was also noted in left hemisphere which at times appeared sharply contoured. Hyperventilation and photic stimulation were not performed.    ABNORMALITY -Lateralized rhythmic delta activity, left hemisphere -Continuous slow, generalized  IMPRESSION: This study showed lateralized rhythmic delta activity in the left hemisphere which can be seen due to underlying structural abnormality, postictal state.  Of note, lateralized rhythmic delta activity is also on the ictal-interictal continuum with low potential for seizures. Additionally EEG suggestive of moderate diffuse encephalopathy, nonspecific etiology but most likely due to medications, postictal state.  No definite seizures were seen during this time.  Walter Hernandez   CT HEAD CODE STROKE WO CONTRAST  Result Date: 12/01/2020 CLINICAL DATA:  Code stroke. Acute neuro deficit, stroke suspected. History of metastatic melanoma. EXAM: CT HEAD WITHOUT CONTRAST TECHNIQUE: Contiguous axial images were obtained from the base of the skull through the vertex without intravenous contrast. COMPARISON:  Head CT 11/09/2020 and MRI 09/21/2020 FINDINGS: Brain: There is no evidence of an acute large territory infarct, intracranial hemorrhage, extra-axial  fluid collection or midline shift/other significant mass effect. Brain metastases demonstrated on the prior MRI are largely occult by CT. Focal cortical hypodensity/encephalomalacia in the high posterior right frontal lobe is unchanged and corresponds to the location of a previously treated lesion. Confluent hypodensities throughout the cerebral white matter bilaterally are unchanged and compatible with post radiation changes superimposed on chronic small vessel ischemia. There is moderate cerebral atrophy. Vascular: No fracture or suspicious osseous crash that calcified atherosclerosis at the skull base. No hyperdense vessel. Skull: No fracture or suspicious osseous lesion. Sinuses/Orbits: Mild mucosal thickening in the ethmoid sinuses. Clear mastoid air cells. Unremarkable orbits. Other: None. ASPECTS [facility] Stroke Program Early CT Score) - Ganglionic level infarction (caudate, lentiform nuclei, internal capsule, insula, M1-M3 cortex): 7 - Supraganglionic infarction (M4-M6 cortex): 3 Total score (0-10 with 10 being normal): 10 IMPRESSION: 1. No evidence of acute intracranial abnormality. 2. ASPECTS is 10. 3. Post radiation changes in the cerebral white matter. These results were communicated to Dr. Rory Percy at 11:07 am on 12/01/2020 by text page via the [facility] messaging system. Electronically  Signed   By: Logan Bores M.D.   On: 12/01/2020 11:07   CT ANGIO HEAD NECK W WO CM (CODE STROKE)  Result Date: 12/01/2020 CLINICAL DATA:  Neurological deficit.  Acute stroke suspected. EXAM: CT ANGIOGRAPHY HEAD AND NECK TECHNIQUE: Multidetector CT imaging of the head and neck was performed using the standard protocol during bolus administration of intravenous contrast. Multiplanar CT image reconstructions and MIPs were obtained to evaluate the vascular anatomy. Carotid stenosis measurements (when applicable) are obtained utilizing NASCET criteria, using the distal internal carotid diameter as the denominator. CONTRAST:  17mL  OMNIPAQUE IOHEXOL 350 MG/ML SOLN COMPARISON:  Head CT earlier same day. FINDINGS: CTA NECK FINDINGS Aortic arch: Aortic atherosclerosis. Branching pattern is normal without origin stenosis. Right carotid system: Common carotid artery widely patent to the bifurcation. Calcified plaque at the carotid bifurcation and ICA bulb but no stenosis compared to the more distal cervical ICA diameter. Left carotid system: Common carotid artery widely patent to the bifurcation. Calcified plaque at the carotid bifurcation and ICA bulb. No stenosis compared to the diameter of the more distal cervical ICA. Vertebral arteries: Left vertebral artery origin is normal. Right vertebral artery origin shows some adjacent calcification but no stenosis. Both vertebral arteries widely patent through the cervical region to the foramen magnum. Skeleton: Ordinary cervical spondylosis. Other neck: No mass or lymphadenopathy. Upper chest: Mild emphysema and pulmonary scarring. Review of the MIP images confirms the above findings CTA HEAD FINDINGS Anterior circulation: Both internal carotid arteries patent through the skull base and siphon regions. Minimal atherosclerotic calcification in the carotid siphon regions but no stenosis. The anterior and middle cerebral vessels are patent. No acute large vessel occlusion or correctable proximal stenosis. Posterior circulation: Both vertebral arteries widely patent through the foramen magnum to the basilar. No basilar stenosis. Posterior circulation branch vessels appear normal. Venous sinuses: Patent and normal. Anatomic variants: None significant. Review of the MIP images confirms the above findings IMPRESSION: No acute intracranial large or medium vessel occlusion. No correctable proximal stenosis. Atherosclerotic calcification at both carotid bifurcations but without stenosis. These results were communicated to Dr. Rory Percy at [place] 6/28/2022by text page via the [facility] messaging system.  Electronically Signed   By: Nelson Chimes M.D.   On: 12/01/2020 11:11    Subjective: Seen and examined at bedside and wife is giving the patient some coffee.  He was doing okay and resting.  Wife was frustrated with the hospice process and wished for hospice to be there when he was discharged home.  Equipment being delivered today.  Patient will be discharged home tomorrow.  No other concerns or complaints at this time.   Discharge Exam: Vitals:   12/17/20 0310 12/17/20 0741  BP: 113/68 119/75  Pulse: 84 88  Resp: 13 15  Temp: 98.6 F (37 C) 97.9 F (36.6 C)  SpO2: 92% 96%   Vitals:   12/16/20 1922 12/16/20 2332 12/17/20 0310 12/17/20 0741  BP: 105/76 104/78 113/68 119/75  Pulse: 99 88 84 88  Resp: 18 15 13 15   Temp: 98.6 F (37 C) 99.1 F (37.3 C) 98.6 F (37 C) 97.9 F (36.6 C)  TempSrc: Oral Axillary Axillary Axillary  SpO2: 95% 98% 92% 96%  Weight:      Height:       General: Pt is alert, awake, not in acute distress Cardiovascular: Tachycardic rate but regular rhythm, S1/S2 +, no rubs, no gallops Respiratory: Mildly diminished bilaterally, no wheezing, no rhonchi; unlabored breathing Abdominal: Soft, NT,  ND, bowel sounds + Extremities: No appreciable edema, no cyanosis  The results of significant diagnostics from this hospitalization (including imaging, microbiology, ancillary and laboratory) are listed below for reference.    Microbiology: No results found for this or any previous visit (from the past 240 hour(s)).   Labs: BNP (last 3 results) No results for input(s): BNP in the last 8760 hours. Basic Metabolic Panel: Recent Labs  Lab 12/11/20 0442 12/13/20 0250 12/15/20 0500  NA 140 141 140  K 5.1 4.2 4.2  CL 109 108 108  CO2 23 25 26   GLUCOSE 98 98 102*  BUN 29* 33* 28*  CREATININE 1.38* 1.52* 1.42*  CALCIUM 9.3 9.2 9.4  MG 1.4* 1.7 1.7    Liver Function Tests: Recent Labs  Lab 12/11/20 0442  AST 21  ALT 24  ALKPHOS 86  BILITOT 0.6  PROT  6.0*  ALBUMIN 3.0*    No results for input(s): LIPASE, AMYLASE in the last 168 hours. No results for input(s): AMMONIA in the last 168 hours. CBC: Recent Labs  Lab 12/11/20 0442 12/13/20 0250 12/15/20 0500  WBC 6.0 5.8 7.2  HGB 9.3* 8.9* 9.5*  HCT 27.4* 27.5* 28.2*  MCV 98.2 100.4* 98.3  PLT 120* 126* 147*    Cardiac Enzymes: No results for input(s): CKTOTAL, CKMB, CKMBINDEX, TROPONINI in the last 168 hours. BNP: Invalid input(s): POCBNP CBG: No results for input(s): GLUCAP in the last 168 hours. D-Dimer No results for input(s): DDIMER in the last 72 hours. Hgb A1c No results for input(s): HGBA1C in the last 72 hours. Lipid Profile No results for input(s): CHOL, HDL, LDLCALC, TRIG, CHOLHDL, LDLDIRECT in the last 72 hours. Thyroid function studies No results for input(s): TSH, T4TOTAL, T3FREE, THYROIDAB in the last 72 hours.  Invalid input(s): FREET3 Anemia work up No results for input(s): VITAMINB12, FOLATE, FERRITIN, TIBC, IRON, RETICCTPCT in the last 72 hours. Urinalysis    Component Value Date/Time   COLORURINE YELLOW 11/09/2020 1900   APPEARANCEUR CLEAR 11/09/2020 1900   LABSPEC 1.010 11/09/2020 1900   PHURINE 5.0 11/09/2020 1900   GLUCOSEU NEGATIVE 11/09/2020 1900   HGBUR NEGATIVE 11/09/2020 1900   BILIRUBINUR NEGATIVE 11/09/2020 1900   KETONESUR NEGATIVE 11/09/2020 1900   PROTEINUR NEGATIVE 11/09/2020 1900   NITRITE NEGATIVE 11/09/2020 1900   LEUKOCYTESUR NEGATIVE 11/09/2020 1900   Sepsis Labs Invalid input(s): PROCALCITONIN,  WBC,  LACTICIDVEN Microbiology No results found for this or any previous visit (from the past 240 hour(s)).  Time coordinating discharge: 35 minutes  SIGNED:  Kerney Elbe, DO [facility] 12/17/2020, 8:20 AM Pager is on [place]  If 7PM-7AM, please contact night-coverage [email or web address]

## 2020-12-17 NOTE — Progress Notes (Unsigned)
Changed Keppra to Solution rather than pill

## 2020-12-18 DIAGNOSIS — N400 Enlarged prostate without lower urinary tract symptoms: Secondary | ICD-10-CM | POA: Diagnosis not present

## 2020-12-18 DIAGNOSIS — C787 Secondary malignant neoplasm of liver and intrahepatic bile duct: Secondary | ICD-10-CM | POA: Diagnosis not present

## 2020-12-18 DIAGNOSIS — Z87891 Personal history of nicotine dependence: Secondary | ICD-10-CM | POA: Diagnosis not present

## 2020-12-18 DIAGNOSIS — Z85820 Personal history of malignant melanoma of skin: Secondary | ICD-10-CM | POA: Diagnosis not present

## 2020-12-18 DIAGNOSIS — N189 Chronic kidney disease, unspecified: Secondary | ICD-10-CM | POA: Diagnosis not present

## 2020-12-18 DIAGNOSIS — R569 Unspecified convulsions: Secondary | ICD-10-CM | POA: Diagnosis not present

## 2020-12-18 DIAGNOSIS — Z6824 Body mass index (BMI) 24.0-24.9, adult: Secondary | ICD-10-CM | POA: Diagnosis not present

## 2020-12-18 DIAGNOSIS — K219 Gastro-esophageal reflux disease without esophagitis: Secondary | ICD-10-CM | POA: Diagnosis not present

## 2020-12-18 DIAGNOSIS — J9600 Acute respiratory failure, unspecified whether with hypoxia or hypercapnia: Secondary | ICD-10-CM | POA: Diagnosis not present

## 2020-12-18 DIAGNOSIS — C7931 Secondary malignant neoplasm of brain: Secondary | ICD-10-CM | POA: Diagnosis not present

## 2020-12-18 DIAGNOSIS — C3490 Malignant neoplasm of unspecified part of unspecified bronchus or lung: Secondary | ICD-10-CM | POA: Diagnosis not present

## 2020-12-18 DIAGNOSIS — R1310 Dysphagia, unspecified: Secondary | ICD-10-CM | POA: Diagnosis not present

## 2020-12-18 DIAGNOSIS — I129 Hypertensive chronic kidney disease with stage 1 through stage 4 chronic kidney disease, or unspecified chronic kidney disease: Secondary | ICD-10-CM | POA: Diagnosis not present

## 2020-12-21 DIAGNOSIS — R1310 Dysphagia, unspecified: Secondary | ICD-10-CM | POA: Diagnosis not present

## 2020-12-21 DIAGNOSIS — J9600 Acute respiratory failure, unspecified whether with hypoxia or hypercapnia: Secondary | ICD-10-CM | POA: Diagnosis not present

## 2020-12-21 DIAGNOSIS — C787 Secondary malignant neoplasm of liver and intrahepatic bile duct: Secondary | ICD-10-CM | POA: Diagnosis not present

## 2020-12-21 DIAGNOSIS — R569 Unspecified convulsions: Secondary | ICD-10-CM | POA: Diagnosis not present

## 2020-12-21 DIAGNOSIS — C3490 Malignant neoplasm of unspecified part of unspecified bronchus or lung: Secondary | ICD-10-CM | POA: Diagnosis not present

## 2020-12-21 DIAGNOSIS — C7931 Secondary malignant neoplasm of brain: Secondary | ICD-10-CM | POA: Diagnosis not present

## 2020-12-22 DIAGNOSIS — R569 Unspecified convulsions: Secondary | ICD-10-CM | POA: Diagnosis not present

## 2020-12-22 DIAGNOSIS — C3490 Malignant neoplasm of unspecified part of unspecified bronchus or lung: Secondary | ICD-10-CM | POA: Diagnosis not present

## 2020-12-22 DIAGNOSIS — C7931 Secondary malignant neoplasm of brain: Secondary | ICD-10-CM | POA: Diagnosis not present

## 2020-12-22 DIAGNOSIS — R1310 Dysphagia, unspecified: Secondary | ICD-10-CM | POA: Diagnosis not present

## 2020-12-22 DIAGNOSIS — J9600 Acute respiratory failure, unspecified whether with hypoxia or hypercapnia: Secondary | ICD-10-CM | POA: Diagnosis not present

## 2020-12-22 DIAGNOSIS — C787 Secondary malignant neoplasm of liver and intrahepatic bile duct: Secondary | ICD-10-CM | POA: Diagnosis not present

## 2020-12-23 DIAGNOSIS — C787 Secondary malignant neoplasm of liver and intrahepatic bile duct: Secondary | ICD-10-CM | POA: Diagnosis not present

## 2020-12-23 DIAGNOSIS — C3490 Malignant neoplasm of unspecified part of unspecified bronchus or lung: Secondary | ICD-10-CM | POA: Diagnosis not present

## 2020-12-23 DIAGNOSIS — R1310 Dysphagia, unspecified: Secondary | ICD-10-CM | POA: Diagnosis not present

## 2020-12-23 DIAGNOSIS — C7931 Secondary malignant neoplasm of brain: Secondary | ICD-10-CM | POA: Diagnosis not present

## 2020-12-23 DIAGNOSIS — J9600 Acute respiratory failure, unspecified whether with hypoxia or hypercapnia: Secondary | ICD-10-CM | POA: Diagnosis not present

## 2020-12-23 DIAGNOSIS — R569 Unspecified convulsions: Secondary | ICD-10-CM | POA: Diagnosis not present

## 2020-12-25 ENCOUNTER — Telehealth: Payer: Self-pay | Admitting: *Deleted

## 2020-12-25 DIAGNOSIS — R569 Unspecified convulsions: Secondary | ICD-10-CM | POA: Diagnosis not present

## 2020-12-25 DIAGNOSIS — C787 Secondary malignant neoplasm of liver and intrahepatic bile duct: Secondary | ICD-10-CM | POA: Diagnosis not present

## 2020-12-25 DIAGNOSIS — C3490 Malignant neoplasm of unspecified part of unspecified bronchus or lung: Secondary | ICD-10-CM | POA: Diagnosis not present

## 2020-12-25 DIAGNOSIS — R1310 Dysphagia, unspecified: Secondary | ICD-10-CM | POA: Diagnosis not present

## 2020-12-25 DIAGNOSIS — J9600 Acute respiratory failure, unspecified whether with hypoxia or hypercapnia: Secondary | ICD-10-CM | POA: Diagnosis not present

## 2020-12-25 DIAGNOSIS — C7931 Secondary malignant neoplasm of brain: Secondary | ICD-10-CM | POA: Diagnosis not present

## 2020-12-29 ENCOUNTER — Encounter: Payer: Self-pay | Admitting: *Deleted

## 2020-12-29 NOTE — Progress Notes (Signed)
Received message that patient passed away at home on 15-Jan-2021. Called and spoke to wife, Walter Hernandez. Actively listened to her and her experience. Provided emotional support.   Oncology Nurse Navigator Documentation  Oncology Nurse Navigator Flowsheets 12/29/2020  Abnormal Finding Date -  Confirmed Diagnosis Date -  Diagnosis Status -  Planned Course of Treatment -  Phase of Treatment -  Chemotherapy Actual Start Date: -  Radiation Actual Start Date: -  Navigator Follow Up Date: -  Navigator Follow Up Reason: -  Navigation Complete Date: 12/29/2020  Post Navigation: Continue to Follow Patient? No  Reason Not Navigating Patient: Airline pilot Encounter Type Telephone  Telephone Outgoing Call  Treatment Initiated Date -  Patient Visit Type MedOnc  Treatment Phase -  Barriers/Navigation Needs -  Education -  Interventions Psycho-Social Support  Acuity -  Referrals -  Coordination of Care -  Education Method -  Support Groups/Services -  Time Spent with Patient 30

## 2021-01-04 NOTE — Telephone Encounter (Signed)
Hospice nurse called and stated,"I wanted Dr. Marin Olp to know that Walter Hernandez passed away today, 01-11-2021, at 10:10 am." I told her that I would give him this message.

## 2021-01-04 DEATH — deceased
